# Patient Record
Sex: Female | Born: 1960 | Race: Black or African American | Hispanic: No | Marital: Single | Smoking: Never smoker
Health system: Southern US, Community
[De-identification: ages and names within clinical notes are randomized; demographics above are authoritative.]

## PROBLEM LIST (undated history)

## (undated) DIAGNOSIS — N939 Abnormal uterine and vaginal bleeding, unspecified: Secondary | ICD-10-CM

## (undated) DIAGNOSIS — I639 Cerebral infarction, unspecified: Secondary | ICD-10-CM

## (undated) DIAGNOSIS — J189 Pneumonia, unspecified organism: Secondary | ICD-10-CM

## (undated) DIAGNOSIS — K5900 Constipation, unspecified: Secondary | ICD-10-CM

## (undated) DIAGNOSIS — W1841XD Slipping, tripping and stumbling without falling due to stepping on object, subsequent encounter: Secondary | ICD-10-CM

## (undated) DIAGNOSIS — E1151 Type 2 diabetes mellitus with diabetic peripheral angiopathy without gangrene: Secondary | ICD-10-CM

## (undated) DIAGNOSIS — K219 Gastro-esophageal reflux disease without esophagitis: Secondary | ICD-10-CM

## (undated) DIAGNOSIS — L089 Local infection of the skin and subcutaneous tissue, unspecified: Secondary | ICD-10-CM

## (undated) DIAGNOSIS — T148XXA Other injury of unspecified body region, initial encounter: Secondary | ICD-10-CM

## (undated) DIAGNOSIS — T4145XA Adverse effect of unspecified anesthetic, initial encounter: Secondary | ICD-10-CM

## (undated) DIAGNOSIS — T8859XA Other complications of anesthesia, initial encounter: Secondary | ICD-10-CM

## (undated) DIAGNOSIS — I4891 Unspecified atrial fibrillation: Secondary | ICD-10-CM

## (undated) DIAGNOSIS — I739 Peripheral vascular disease, unspecified: Secondary | ICD-10-CM

## (undated) DIAGNOSIS — N186 End stage renal disease: Secondary | ICD-10-CM

## (undated) DIAGNOSIS — D649 Anemia, unspecified: Secondary | ICD-10-CM

## (undated) DIAGNOSIS — I10 Essential (primary) hypertension: Secondary | ICD-10-CM

## (undated) HISTORY — PX: KNEE SURGERY: SHX244

## (undated) HISTORY — DX: End stage renal disease: N18.6

## (undated) HISTORY — DX: Cerebral infarction, unspecified: I63.9

## (undated) HISTORY — PX: FOOT SURGERY: SHX648

## (undated) HISTORY — DX: Essential (primary) hypertension: I10

## (undated) HISTORY — DX: Abnormal uterine and vaginal bleeding, unspecified: N93.9

## (undated) HISTORY — PX: AV FISTULA PLACEMENT: SHX1204

## (undated) HISTORY — DX: Slipping, tripping and stumbling without falling due to stepping on object, subsequent encounter: W18.41XD

## (undated) HISTORY — PX: LEG SURGERY: SHX1003

## (undated) HISTORY — PX: BRACHIAL ARTERY GRAFT: SHX1258

## (undated) HISTORY — PX: OTHER SURGICAL HISTORY: SHX169

## (undated) HISTORY — DX: Type 2 diabetes mellitus with diabetic peripheral angiopathy without gangrene: E11.51

---

## 1984-04-23 HISTORY — PX: DILATION AND CURETTAGE OF UTERUS: SHX78

## 1997-08-01 ENCOUNTER — Inpatient Hospital Stay (HOSPITAL_COMMUNITY): Admission: EM | Admit: 1997-08-01 | Discharge: 1997-08-04 | Payer: Self-pay | Admitting: *Deleted

## 1998-03-15 ENCOUNTER — Inpatient Hospital Stay (HOSPITAL_COMMUNITY): Admission: EM | Admit: 1998-03-15 | Discharge: 1998-03-17 | Payer: Self-pay | Admitting: Emergency Medicine

## 1998-03-15 ENCOUNTER — Encounter: Payer: Self-pay | Admitting: Internal Medicine

## 1998-06-07 ENCOUNTER — Emergency Department (HOSPITAL_COMMUNITY): Admission: EM | Admit: 1998-06-07 | Discharge: 1998-06-08 | Payer: Self-pay | Admitting: Emergency Medicine

## 1998-08-17 ENCOUNTER — Emergency Department (HOSPITAL_COMMUNITY): Admission: EM | Admit: 1998-08-17 | Discharge: 1998-08-17 | Payer: Self-pay | Admitting: Emergency Medicine

## 1998-08-17 ENCOUNTER — Encounter: Payer: Self-pay | Admitting: Emergency Medicine

## 1999-01-18 ENCOUNTER — Emergency Department (HOSPITAL_COMMUNITY): Admission: EM | Admit: 1999-01-18 | Discharge: 1999-01-18 | Payer: Self-pay | Admitting: Emergency Medicine

## 1999-10-03 ENCOUNTER — Ambulatory Visit (HOSPITAL_COMMUNITY): Admission: RE | Admit: 1999-10-03 | Discharge: 1999-10-03 | Payer: Self-pay | Admitting: *Deleted

## 2000-03-18 ENCOUNTER — Encounter: Payer: Self-pay | Admitting: Emergency Medicine

## 2000-03-18 ENCOUNTER — Emergency Department (HOSPITAL_COMMUNITY): Admission: EM | Admit: 2000-03-18 | Discharge: 2000-03-18 | Payer: Self-pay

## 2000-05-19 ENCOUNTER — Inpatient Hospital Stay (HOSPITAL_COMMUNITY): Admission: EM | Admit: 2000-05-19 | Discharge: 2000-05-24 | Payer: Self-pay

## 2000-05-19 ENCOUNTER — Encounter: Payer: Self-pay | Admitting: *Deleted

## 2000-05-21 ENCOUNTER — Encounter: Payer: Self-pay | Admitting: Pediatrics

## 2000-05-21 ENCOUNTER — Encounter: Payer: Self-pay | Admitting: Nephrology

## 2000-06-01 ENCOUNTER — Emergency Department (HOSPITAL_COMMUNITY): Admission: EM | Admit: 2000-06-01 | Discharge: 2000-06-01 | Payer: Self-pay | Admitting: Emergency Medicine

## 2000-06-01 ENCOUNTER — Encounter: Payer: Self-pay | Admitting: Emergency Medicine

## 2000-07-04 ENCOUNTER — Ambulatory Visit (HOSPITAL_COMMUNITY): Admission: RE | Admit: 2000-07-04 | Discharge: 2000-07-04 | Payer: Self-pay | Admitting: Nephrology

## 2000-07-04 ENCOUNTER — Encounter: Payer: Self-pay | Admitting: Nephrology

## 2000-08-05 ENCOUNTER — Emergency Department (HOSPITAL_COMMUNITY): Admission: EM | Admit: 2000-08-05 | Discharge: 2000-08-05 | Payer: Self-pay | Admitting: Emergency Medicine

## 2000-11-05 ENCOUNTER — Ambulatory Visit (HOSPITAL_COMMUNITY): Admission: RE | Admit: 2000-11-05 | Discharge: 2000-11-05 | Payer: Self-pay | Admitting: Vascular Surgery

## 2000-11-05 ENCOUNTER — Encounter: Payer: Self-pay | Admitting: Vascular Surgery

## 2000-11-21 ENCOUNTER — Encounter: Admission: RE | Admit: 2000-11-21 | Discharge: 2000-11-21 | Payer: Self-pay | Admitting: Nephrology

## 2000-11-21 ENCOUNTER — Encounter: Payer: Self-pay | Admitting: Nephrology

## 2000-12-03 ENCOUNTER — Other Ambulatory Visit: Admission: RE | Admit: 2000-12-03 | Discharge: 2000-12-03 | Payer: Self-pay | Admitting: *Deleted

## 2001-02-27 ENCOUNTER — Encounter: Admission: RE | Admit: 2001-02-27 | Discharge: 2001-02-27 | Payer: Self-pay | Admitting: Nephrology

## 2001-02-27 ENCOUNTER — Encounter: Payer: Self-pay | Admitting: Nephrology

## 2001-06-22 ENCOUNTER — Encounter: Payer: Self-pay | Admitting: Emergency Medicine

## 2001-06-22 ENCOUNTER — Emergency Department (HOSPITAL_COMMUNITY): Admission: EM | Admit: 2001-06-22 | Discharge: 2001-06-22 | Payer: Self-pay | Admitting: Emergency Medicine

## 2001-11-25 ENCOUNTER — Encounter: Admission: RE | Admit: 2001-11-25 | Discharge: 2001-11-25 | Payer: Self-pay | Admitting: Nephrology

## 2001-11-25 ENCOUNTER — Encounter: Payer: Self-pay | Admitting: Nephrology

## 2001-12-02 ENCOUNTER — Ambulatory Visit (HOSPITAL_COMMUNITY): Admission: RE | Admit: 2001-12-02 | Discharge: 2001-12-02 | Payer: Self-pay | Admitting: Nephrology

## 2001-12-02 ENCOUNTER — Encounter: Payer: Self-pay | Admitting: Nephrology

## 2001-12-11 ENCOUNTER — Other Ambulatory Visit: Admission: RE | Admit: 2001-12-11 | Discharge: 2001-12-11 | Payer: Self-pay | Admitting: *Deleted

## 2001-12-11 ENCOUNTER — Other Ambulatory Visit: Admission: RE | Admit: 2001-12-11 | Discharge: 2001-12-11 | Payer: Self-pay | Admitting: Obstetrics and Gynecology

## 2002-07-20 ENCOUNTER — Encounter: Payer: Self-pay | Admitting: Vascular Surgery

## 2002-07-20 ENCOUNTER — Ambulatory Visit (HOSPITAL_COMMUNITY): Admission: RE | Admit: 2002-07-20 | Discharge: 2002-07-20 | Payer: Self-pay | Admitting: Vascular Surgery

## 2003-01-14 ENCOUNTER — Encounter: Admission: RE | Admit: 2003-01-14 | Discharge: 2003-01-14 | Payer: Self-pay | Admitting: Nephrology

## 2003-01-14 ENCOUNTER — Encounter: Payer: Self-pay | Admitting: Nephrology

## 2003-01-26 ENCOUNTER — Other Ambulatory Visit: Admission: RE | Admit: 2003-01-26 | Discharge: 2003-01-26 | Payer: Self-pay | Admitting: *Deleted

## 2003-04-07 ENCOUNTER — Ambulatory Visit (HOSPITAL_COMMUNITY): Admission: RE | Admit: 2003-04-07 | Discharge: 2003-04-07 | Payer: Self-pay | Admitting: Vascular Surgery

## 2003-04-30 ENCOUNTER — Ambulatory Visit (HOSPITAL_COMMUNITY): Admission: RE | Admit: 2003-04-30 | Discharge: 2003-04-30 | Payer: Self-pay | Admitting: Vascular Surgery

## 2003-05-31 ENCOUNTER — Ambulatory Visit (HOSPITAL_COMMUNITY): Admission: RE | Admit: 2003-05-31 | Discharge: 2003-05-31 | Payer: Self-pay | Admitting: Vascular Surgery

## 2004-04-19 ENCOUNTER — Encounter: Admission: RE | Admit: 2004-04-19 | Discharge: 2004-04-19 | Payer: Self-pay | Admitting: Nephrology

## 2004-09-08 ENCOUNTER — Ambulatory Visit (HOSPITAL_COMMUNITY): Admission: RE | Admit: 2004-09-08 | Discharge: 2004-09-08 | Payer: Self-pay | Admitting: Vascular Surgery

## 2005-01-26 ENCOUNTER — Ambulatory Visit (HOSPITAL_COMMUNITY): Admission: RE | Admit: 2005-01-26 | Discharge: 2005-01-26 | Payer: Self-pay | Admitting: Vascular Surgery

## 2005-01-27 ENCOUNTER — Ambulatory Visit (HOSPITAL_COMMUNITY): Admission: RE | Admit: 2005-01-27 | Discharge: 2005-01-27 | Payer: Self-pay | Admitting: Vascular Surgery

## 2005-05-14 ENCOUNTER — Encounter: Admission: RE | Admit: 2005-05-14 | Discharge: 2005-05-14 | Payer: Self-pay | Admitting: Nephrology

## 2005-05-30 ENCOUNTER — Emergency Department (HOSPITAL_COMMUNITY): Admission: EM | Admit: 2005-05-30 | Discharge: 2005-05-30 | Payer: Self-pay | Admitting: Emergency Medicine

## 2005-07-18 ENCOUNTER — Ambulatory Visit (HOSPITAL_COMMUNITY): Admission: RE | Admit: 2005-07-18 | Discharge: 2005-07-18 | Payer: Self-pay | Admitting: Nephrology

## 2005-07-27 ENCOUNTER — Emergency Department (HOSPITAL_COMMUNITY): Admission: EM | Admit: 2005-07-27 | Discharge: 2005-07-27 | Payer: Self-pay | Admitting: Emergency Medicine

## 2005-07-27 ENCOUNTER — Ambulatory Visit (HOSPITAL_COMMUNITY): Admission: RE | Admit: 2005-07-27 | Discharge: 2005-07-27 | Payer: Self-pay | Admitting: Nephrology

## 2005-07-28 ENCOUNTER — Inpatient Hospital Stay (HOSPITAL_COMMUNITY): Admission: EM | Admit: 2005-07-28 | Discharge: 2005-08-04 | Payer: Self-pay | Admitting: Nephrology

## 2005-07-30 ENCOUNTER — Encounter (INDEPENDENT_AMBULATORY_CARE_PROVIDER_SITE_OTHER): Payer: Self-pay | Admitting: *Deleted

## 2006-05-16 ENCOUNTER — Encounter: Admission: RE | Admit: 2006-05-16 | Discharge: 2006-05-16 | Payer: Self-pay | Admitting: Nephrology

## 2006-05-29 ENCOUNTER — Encounter: Admission: RE | Admit: 2006-05-29 | Discharge: 2006-05-29 | Payer: Self-pay | Admitting: Nephrology

## 2006-06-11 ENCOUNTER — Emergency Department (HOSPITAL_COMMUNITY): Admission: EM | Admit: 2006-06-11 | Discharge: 2006-06-11 | Payer: Self-pay | Admitting: Emergency Medicine

## 2006-06-25 ENCOUNTER — Emergency Department (HOSPITAL_COMMUNITY): Admission: EM | Admit: 2006-06-25 | Discharge: 2006-06-25 | Payer: Self-pay | Admitting: Family Medicine

## 2006-08-13 ENCOUNTER — Emergency Department (HOSPITAL_COMMUNITY): Admission: EM | Admit: 2006-08-13 | Discharge: 2006-08-13 | Payer: Self-pay | Admitting: Emergency Medicine

## 2006-12-04 ENCOUNTER — Emergency Department (HOSPITAL_COMMUNITY): Admission: EM | Admit: 2006-12-04 | Discharge: 2006-12-04 | Payer: Self-pay | Admitting: Emergency Medicine

## 2007-01-23 ENCOUNTER — Inpatient Hospital Stay (HOSPITAL_COMMUNITY): Admission: RE | Admit: 2007-01-23 | Discharge: 2007-01-27 | Payer: Self-pay | Admitting: Orthopedic Surgery

## 2007-01-24 ENCOUNTER — Ambulatory Visit: Payer: Self-pay | Admitting: Internal Medicine

## 2007-02-06 ENCOUNTER — Ambulatory Visit (HOSPITAL_COMMUNITY): Admission: RE | Admit: 2007-02-06 | Discharge: 2007-02-06 | Payer: Self-pay | Admitting: Nephrology

## 2007-02-11 ENCOUNTER — Encounter: Admission: RE | Admit: 2007-02-11 | Discharge: 2007-03-13 | Payer: Self-pay | Admitting: Orthopedic Surgery

## 2007-05-27 ENCOUNTER — Encounter: Admission: RE | Admit: 2007-05-27 | Discharge: 2007-05-27 | Payer: Self-pay | Admitting: Nephrology

## 2007-05-27 ENCOUNTER — Emergency Department (HOSPITAL_COMMUNITY): Admission: EM | Admit: 2007-05-27 | Discharge: 2007-05-27 | Payer: Self-pay | Admitting: Emergency Medicine

## 2007-11-28 ENCOUNTER — Ambulatory Visit: Payer: Self-pay | Admitting: *Deleted

## 2007-11-28 ENCOUNTER — Ambulatory Visit (HOSPITAL_COMMUNITY): Admission: RE | Admit: 2007-11-28 | Discharge: 2007-11-28 | Payer: Self-pay | Admitting: Vascular Surgery

## 2007-12-01 ENCOUNTER — Ambulatory Visit (HOSPITAL_COMMUNITY): Admission: RE | Admit: 2007-12-01 | Discharge: 2007-12-01 | Payer: Self-pay | Admitting: Nephrology

## 2008-01-08 ENCOUNTER — Emergency Department (HOSPITAL_COMMUNITY): Admission: EM | Admit: 2008-01-08 | Discharge: 2008-01-08 | Payer: Self-pay | Admitting: Emergency Medicine

## 2008-02-12 ENCOUNTER — Ambulatory Visit (HOSPITAL_COMMUNITY): Admission: RE | Admit: 2008-02-12 | Discharge: 2008-02-12 | Payer: Self-pay | Admitting: Nephrology

## 2008-06-12 ENCOUNTER — Ambulatory Visit (HOSPITAL_COMMUNITY): Admission: RE | Admit: 2008-06-12 | Discharge: 2008-06-12 | Payer: Self-pay | Admitting: Nephrology

## 2008-06-15 ENCOUNTER — Ambulatory Visit (HOSPITAL_COMMUNITY): Admission: RE | Admit: 2008-06-15 | Discharge: 2008-06-15 | Payer: Self-pay | Admitting: Family Medicine

## 2008-06-21 ENCOUNTER — Emergency Department (HOSPITAL_COMMUNITY): Admission: EM | Admit: 2008-06-21 | Discharge: 2008-06-21 | Payer: Self-pay | Admitting: Emergency Medicine

## 2008-06-29 ENCOUNTER — Ambulatory Visit (HOSPITAL_COMMUNITY): Admission: RE | Admit: 2008-06-29 | Discharge: 2008-06-29 | Payer: Self-pay | Admitting: Nephrology

## 2008-07-13 ENCOUNTER — Ambulatory Visit: Payer: Self-pay | Admitting: Vascular Surgery

## 2008-07-13 ENCOUNTER — Encounter: Admission: RE | Admit: 2008-07-13 | Discharge: 2008-07-13 | Payer: Self-pay | Admitting: Nephrology

## 2008-08-19 ENCOUNTER — Inpatient Hospital Stay (HOSPITAL_COMMUNITY): Admission: RE | Admit: 2008-08-19 | Discharge: 2008-08-21 | Payer: Self-pay | Admitting: Vascular Surgery

## 2008-08-19 ENCOUNTER — Ambulatory Visit: Payer: Self-pay | Admitting: Vascular Surgery

## 2008-09-07 ENCOUNTER — Ambulatory Visit: Payer: Self-pay | Admitting: Vascular Surgery

## 2008-10-07 ENCOUNTER — Emergency Department (HOSPITAL_COMMUNITY): Admission: EM | Admit: 2008-10-07 | Discharge: 2008-10-07 | Payer: Self-pay | Admitting: Emergency Medicine

## 2008-10-14 ENCOUNTER — Ambulatory Visit (HOSPITAL_COMMUNITY): Admission: RE | Admit: 2008-10-14 | Discharge: 2008-10-14 | Payer: Self-pay | Admitting: Nephrology

## 2008-11-04 ENCOUNTER — Ambulatory Visit (HOSPITAL_COMMUNITY): Admission: RE | Admit: 2008-11-04 | Discharge: 2008-11-04 | Payer: Self-pay | Admitting: Nephrology

## 2009-04-01 ENCOUNTER — Emergency Department (HOSPITAL_COMMUNITY): Admission: EM | Admit: 2009-04-01 | Discharge: 2009-04-01 | Payer: Self-pay | Admitting: Family Medicine

## 2009-05-07 ENCOUNTER — Emergency Department (HOSPITAL_COMMUNITY): Admission: EM | Admit: 2009-05-07 | Discharge: 2009-05-07 | Payer: Self-pay | Admitting: Emergency Medicine

## 2009-07-28 ENCOUNTER — Encounter: Admission: RE | Admit: 2009-07-28 | Discharge: 2009-07-28 | Payer: Self-pay | Admitting: Nephrology

## 2009-09-01 ENCOUNTER — Emergency Department (HOSPITAL_COMMUNITY): Admission: EM | Admit: 2009-09-01 | Discharge: 2009-09-01 | Payer: Self-pay | Admitting: Family Medicine

## 2010-05-14 ENCOUNTER — Encounter: Payer: Self-pay | Admitting: *Deleted

## 2010-05-14 ENCOUNTER — Encounter: Payer: Self-pay | Admitting: Nephrology

## 2010-07-08 LAB — CBC
Hemoglobin: 12.5 g/dL (ref 12.0–15.0)
MCHC: 33.3 g/dL (ref 30.0–36.0)
RBC: 4.28 MIL/uL (ref 3.87–5.11)
RDW: 18 % — ABNORMAL HIGH (ref 11.5–15.5)

## 2010-07-08 LAB — COMPREHENSIVE METABOLIC PANEL
BUN: 31 mg/dL — ABNORMAL HIGH (ref 6–23)
Calcium: 10.5 mg/dL (ref 8.4–10.5)
Chloride: 94 mEq/L — ABNORMAL LOW (ref 96–112)
Creatinine, Ser: 10.3 mg/dL — ABNORMAL HIGH (ref 0.4–1.2)
GFR calc non Af Amer: 4 mL/min — ABNORMAL LOW (ref >60)

## 2010-07-08 LAB — DIFFERENTIAL
Basophils Absolute: 0 10*3/uL (ref 0.0–0.1)
Lymphocytes Relative: 25 % (ref 12–46)
Monocytes Absolute: 0.4 10*3/uL (ref 0.1–1.0)
Neutro Abs: 5.2 10*3/uL (ref 1.7–7.7)

## 2010-07-31 LAB — WET PREP, GENITAL

## 2010-07-31 LAB — GC/CHLAMYDIA PROBE AMP, GENITAL: Chlamydia, DNA Probe: NEGATIVE

## 2010-08-01 LAB — GLUCOSE, CAPILLARY
Glucose-Capillary: 127 mg/dL — ABNORMAL HIGH (ref 70–99)
Glucose-Capillary: 232 mg/dL — ABNORMAL HIGH (ref 70–99)

## 2010-08-02 LAB — CBC
Platelets: 236 10*3/uL (ref 150–400)
RDW: 22.5 % — ABNORMAL HIGH (ref 11.5–15.5)

## 2010-08-02 LAB — RENAL FUNCTION PANEL
Albumin: 3.1 g/dL — ABNORMAL LOW (ref 3.5–5.2)
Phosphorus: 6.5 mg/dL — ABNORMAL HIGH (ref 2.3–4.6)
Potassium: 4.8 mEq/L (ref 3.5–5.1)
Sodium: 136 mEq/L (ref 135–145)

## 2010-08-02 LAB — GLUCOSE, CAPILLARY
Glucose-Capillary: 136 mg/dL — ABNORMAL HIGH (ref 70–99)
Glucose-Capillary: 142 mg/dL — ABNORMAL HIGH (ref 70–99)
Glucose-Capillary: 149 mg/dL — ABNORMAL HIGH (ref 70–99)
Glucose-Capillary: 154 mg/dL — ABNORMAL HIGH (ref 70–99)
Glucose-Capillary: 296 mg/dL — ABNORMAL HIGH (ref 70–99)
Glucose-Capillary: 87 mg/dL (ref 70–99)

## 2010-08-02 LAB — POCT I-STAT 4, (NA,K, GLUC, HGB,HCT)
HCT: 44 % (ref 36.0–46.0)
Hemoglobin: 15 g/dL (ref 12.0–15.0)

## 2010-08-08 LAB — CBC
HCT: 38.7 % (ref 36.0–46.0)
Hemoglobin: 12.8 g/dL (ref 12.0–15.0)
RBC: 4.63 MIL/uL (ref 3.87–5.11)
RDW: 20.9 % — ABNORMAL HIGH (ref 11.5–15.5)

## 2010-08-08 LAB — APTT: aPTT: 34 seconds (ref 24–37)

## 2010-08-08 LAB — BASIC METABOLIC PANEL
CO2: 21 mEq/L (ref 19–32)
GFR calc Af Amer: 3 mL/min — ABNORMAL LOW (ref >60)
GFR calc non Af Amer: 2 mL/min — ABNORMAL LOW (ref >60)
Glucose, Bld: 78 mg/dL (ref 70–99)
Potassium: 4.3 mEq/L (ref 3.5–5.1)
Sodium: 134 mEq/L — ABNORMAL LOW (ref 135–145)

## 2010-08-23 ENCOUNTER — Other Ambulatory Visit: Payer: Self-pay | Admitting: Nephrology

## 2010-08-23 DIAGNOSIS — Z1231 Encounter for screening mammogram for malignant neoplasm of breast: Secondary | ICD-10-CM

## 2010-09-05 ENCOUNTER — Ambulatory Visit
Admission: RE | Admit: 2010-09-05 | Discharge: 2010-09-05 | Disposition: A | Discharge status: Home / Self Care | Payer: Medicaid Other | Source: Ambulatory Visit | Attending: Nephrology | Admitting: Nephrology

## 2010-09-05 DIAGNOSIS — Z1231 Encounter for screening mammogram for malignant neoplasm of breast: Secondary | ICD-10-CM

## 2010-09-05 NOTE — Procedures (Signed)
CEPHALIC VEIN MAPPING   INDICATION:  Cephalic vein mapping for dialysis access site.   HISTORY:  End-stage renal disease.   EXAM:   The right cephalic vein is compressible.   Diameter measurements range from 0.31 to 0.14 cm.   The left cephalic vein is not evaluated.   See attached worksheet for all measurements.   IMPRESSION:  Patent right cephalic vein which is of acceptable diameter  for use as a dialysis access site.   ___________________________________________  Quita Skye. Hart Rochester, M.D.   AC/MEDQ  D:  07/13/2008  T:  07/13/2008  Job:  [id number]

## 2010-09-05 NOTE — Op Note (Signed)
[name], [name]              ACCOUNT NO.:  [id number]   MEDICAL RECORD NO.:  [id number]          PATIENT TYPE:  OIB   LOCATION:  6729                         FACILITY:  [facility]   PHYSICIAN:  Quita Skye. Hart Rochester, M.D.  DATE OF BIRTH:  08/02/1960   DATE OF PROCEDURE:  08/19/2008  DATE OF DISCHARGE:                               OPERATIVE REPORT   PREOPERATIVE DIAGNOSIS:  End-stage renal disease.   POSTOPERATIVE DIAGNOSIS:  End-stage renal disease.   OPERATION:  Insertion of a right thigh arteriovenous Gore-Tex graft (6  mm) from common femoral artery to saphenofemoral junction (venous).   SURGEON:  Quita Skye. Hart Rochester, MD   FIRST ASSISTANT:  Jerold Coombe, PA   ANESTHESIA:  General endotracheal.   PROCEDURE:  The patient was taken to the operating room and placed in a  supine position at which time satisfactory general endotracheal  anesthesia was administered.  The right groin and thigh were prepped  with Betadine scrub solution and draped in routine sterile manner.  A  short longitudinal incision was made just distal to the inguinal crease.  Superficial femoral artery was dissected free at its origin.  It was a  very diffusely calcified vessel, although it was patent.  Therefore, the  common and profunda femoris arteries were dissected free.  The common  femoral much softer anteriorly with a good pulse.  Saphenous vein was  exposed up to the saphenofemoral junction, its branches ligated with 3-0  silk ties and divided.  A loop-shaped tunnel was then created and a 6 mm  x 40 cm Gore-Tex graft delivered through the tunnel using a small  counterincision at the apex loop.  No heparin was given.  Artery was  occluded proximally and distally, opened with a 15 blade, and extended  with Potts scissors in the distal common femoral artery.  There was  excellent inflow present.  Gore-Tex was slightly spatulated and  anastomosed end-to-side with 6-0 Prolene.  Following this, the vein  was  ligated distally.  The saphenous vein was ligated distally and opened 15  blade and extended with Potts scissors to a point about 1-2 cm proximal  to the saphenofemoral junction.  It was a widely patent vein which  easily accepted a 5 dilator.  Gore-Tex was anastomosed end-to-side with  6-0 Prolene.  Clamps were released.  There was a good pulse and thrill  in the graft.  Adequate hemostasis was achieved.  Wounds were closed in  layers with Vicryl in subcuticular fashion.  A sterile dressing applied.  The patient was taken to recovery room in satisfactory condition.      Quita Skye Hart Rochester, M.D.  Electronically Signed     JDL/MEDQ  D:  08/19/2008  T:  08/20/2008  Job:  [id number]

## 2010-09-05 NOTE — Discharge Summary (Signed)
NAMEEFRATA, BRUNNER NO.:  [id number]   MEDICAL RECORD NO.:  [id number]          PATIENT TYPE:  INP   LOCATION:  5524                         FACILITY:  [facility]   PHYSICIAN:  Dyke Brackett, M.D.    DATE OF BIRTH:  May 05, 1960   DATE OF ADMISSION:  01/23/2007  DATE OF DISCHARGE:  01/27/2007                               DISCHARGE SUMMARY   ADMISSION DIAGNOSES:  1. Septic osteoarthritis right knee.  2. Chronic renal insufficiency on hemodialysis.  3. Diabetes mellitus.  4. Hypertension.   DISCHARGE DIAGNOSES:  1. Septic right knee status post irrigation and debridement.  2. Acute blood loss anemia secondary to surgery.  3. End-stage renal disease on hemodialysis.  4. Non-insulin dependent diabetes mellitus.  5. Hypertension.   SURGICAL PROCEDURES:  On January 23, 2007, Ms. Date underwent an  arthroscopic irrigation and debridement and partial synovectomy of her  right knee by Dr. Jonny Ruiz L. Rendall.   COMPLICATIONS:  None.   CONSULTANT:  1. Renal Medicine consult January 23, 2007.  2. Pharmacy consult for vancomycin for test treatment January 24, 2007      in addition to a physical therapy consult.  3. Infectious disease consult January 24, 2007.   HISTORY OF PRESENT ILLNESS:  This 50 year old black female patient  presented to our office with a painful right knee for more than 1 month.  She had an MRI which showed extensive edema consistent with severe  synovitis.  She had been in the office a week before for evaluation, but  walked out before aspiration could be done or complete evaluation done,  and returned today with malaise, fevers or loss of appetite.  Aspiration  of the knee at that time showed probable purulent material.  She is  being admitted for arthroscopic I&D of the right knee.   HOSPITAL COURSE:  Ms. Koffler was admitted and a renal consult was  obtained.  They followed her throughout her hospitalization.  She  subsequently underwent an  arthroscopic I&D and partial synovectomy of  the right knee by Dr. Priscille Kluver that evening and tolerated it well.   On postop day number 1, she was being dialyzed.  Knee was much more  comfortable.  T-max was 99.5, vitals were stable.  Leg was  neurovascularly intact.  Infectious disease was consulted and she was  started on therapy.   Postop day number 2, T-max had been 102.1, vitals were stable.  She  continued on vancomycin and Fortaz.  She did have gram positive cocci  grow out of 2 out of 4 blood cultures and I&D was awaited.  Pain had  improved.  Leg was neurovascularly intact.  She was continued on CPM and  PT, and continued on antibiotics.   Postop day number 3, she remained afebrile, vitals stable.  There was no  drainage from the knee at all.  Hemoglobin was 7.2, hematocrit 21.8.  She was to get 2 units of packed red blood cells with dialysis the next  day.  Cultures grew out coag-negative staph and she was continued on the  current antibiotics till her final  cultures were available.   On January 27, 2007, she was doing well and felt she was ready for  discharge home.  She was transfused during dialysis.  ID recommended  discharge on Avelox 400 mg a day for 3 more weeks.  She was continued on  her normal treatment course for her hemodialysis and she was to follow  up with [place] in the next week or so.   DIET:  She is to resume her regular renal diet.   MEDICATIONS:  She is to resume her regular prehospitalization meds.  These include:  1. Aspirin 81 mg p.o. q.a.m.  2. Norvasc 10 mg p.o. q.p.m.  3. Glipizide 10 mg p.o. b.i.d.  4. Calcium 500 mg p.o. q.a.c.  5. Renovite 1 tablet p.o. q.a.m.  6. Actos 45 mg p.o. q.a.m.   ADDITIONAL MEDICATIONS:  1. Avelox 400 mg 1 tablet p.o. q.a.m. for 3 more weeks.  2. Ultram 50 mg 1 tablet p.o. q.4-6 h. p.r.n. for pain, 50 with no      refill.  3. Renagel 800 mg 2 tablets p.o. q. with each meal.  4. We did want her to hold her calcium at  this time.  5. Norvasc 10 mg p.o. q.h.s.  6. Clonidine 0.1 mg p.o. q.h.s.   ACTIVITY:  She is to be out of bed weightbearing as tolerated on the  right leg with a walker.  She may shower and bathe.  No lifting for 4  weeks.  No driving for 4 weeks.  She is to increase activity slowly.   WOUND CARE:  She is to keep the incisions clean, dry and cover with a  Band-Aid.  Change that daily. Notify Dr. Priscille Kluver if signs of any  infection.   FOLLOW UP:  She needs to follow up with Dr. Priscille Kluver in our office on  February 04, 2007 and she needs to call [phone] for that appointment.   DIAGNOSTICS:  Chest x-ray done on January 23, 2007 showed no active  disease.   LABORATORY DATA:  Hemoglobin/hematocrit ranged from 6.2 and 18.9 on  January 23, 2007 to 6.7, 20.9 on January 24, 2007 to 7.2 and 21.8 on  January 26, 2007.  White count ranged from 7.9 on  January 23, 2007 to  10.5 on January 24, 2007 to 8.7 on January 26, 2007.  Platelets ranged  from 596 on January 23, 2007 to 504 on January 24, 2007 to 565 on January 26, 2007.  ESR on January 23, 2007 was greater than 140.  PT on January 23, 2007 was  16.4 with an INR of 1.3.  On January 23, 2007, chloride was 94,  creatinine is 8.5.  On January 24, 2007  BUN was 28, creatinine 10.09.  On January 26, 2007 36 and 9.83 and on January 27, 2007 51 and 13.21.  Sodium ranged from 135 on January 24, 2007 to 129 on January 27, 2007.  Chloride from 94 on January 24, 2007 to 90 on January 27, 2007.  Glucose  from 152 on January 24, 2007 and 92 on January 26, 2007, and 222 on  January 27, 2007.  Estimated GFR was only 5 mL per minute on January 23, 2007.  It went down to 3 mL per second on January 27, 2007.  On January 23, 2007 albumin was low at 2.7 and went to a  low of 2.3 on  January 27, 2007.  UA on January 23, 2007 showed cloudy  yellow urine  250 mg/dL,  glucose large, hemoglobin greater than 300  mg/dL protein, many epithelials, 0-2 white cells and red cells, rare   bacteria.  All other laboratory studies are within normal limits.      Legrand Pitts Duffy, Arnetha Courser, M.D.  Electronically Signed    KED/MEDQ  D:  03/12/2007  T:  03/12/2007  Job:  [id number]   cc:   Renal Service

## 2010-09-05 NOTE — Assessment & Plan Note (Signed)
OFFICE VISIT   [name], [name]  DOB:  December 04, 1960                                       07/13/2008  [id number]#:[id number]   The patient returns for evaluation for vascular access.  She was  previously had left upper extremity grafts placed by [place] but for the past  few years has had a Diatek catheter initially on the left side which  then became infected and most recently on the right side.  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  The Diatek catheter traverses this and probably almost obstructs the  venous return which caused the facial edema.  She was sent for  evaluation for vascular access today.  She does have vein mapping as  well which reveals a small cephalic vein throughout the right upper  extremity.  She has never had lower extremity grafts.   PHYSICAL EXAMINATION:  On exam she does have facial edema which she  states is improving but has not resolved.  Right IJ Diatek catheter is  in place.  She has excellent femoral, popliteal and dorsalis pedis  pulses bilaterally.   This patient needs a thigh graft for her next vascular access.  She is  not a good candidate for a HeRO graft because it will do the same thing  that the Diatek is doing and result in facial and neck swelling since it  will have to traverse this area of severe stenosis.  I think this would  be better reserved for a final access site after using her lower  extremities.  I have scheduled her for insertion of a right thigh AV  graft on Thursday, April 8.  She seems somewhat reluctant and will be  discussing this with Dr. Eliott Nine but was willing to go ahead and schedule  this for now.  I think access should be placed so that the right IJ  Diatek can be removed to eliminate the edema in head and neck  areas.   Quita Skye Hart Rochester, M.D.  Electronically Signed   JDL/MEDQ  D:  07/13/2008  T:  07/14/2008  Job:  [id number]

## 2010-09-05 NOTE — Assessment & Plan Note (Signed)
OFFICE VISIT   [name], [name]  DOB:  05-18-1960                                       09/07/2008  [id number]#:[id number]   The patient returns for followup regarding insertion of a right thigh AV  graft by me on 08/19/2008.  She currently has a Diatek catheter in the  right internal jugular vein and has fascial edema which is resolving  because of a severe stenosis in that area.  The AV graft has an  excellent pulse and palpable thrill and the incisions have healed  satisfactorily.  There is some mild swelling in the inguinal area but  this should resolve.  There is no evidence of infection.  There is no  evidence of steal distally in the right leg.  She was reassured  regarding these findings.  I think the graft could be used in two more  weeks and then the Diatek catheter can be removed shortly thereafter.   Quita Skye Hart Rochester, M.D.  Electronically Signed   JDL/MEDQ  D:  09/07/2008  T:  09/08/2008  Job:  [id number]   cc:   Wilber Bihari. Caryn Section, M.D.

## 2010-09-05 NOTE — Op Note (Signed)
[name], [name]              ACCOUNT NO.:  [id number]   MEDICAL RECORD NO.:  [id number]          PATIENT TYPE:  OIB   LOCATION:  5524                         FACILITY:  [facility]   PHYSICIAN:  John L. Rendall, M.D.  DATE OF BIRTH:  Oct 05, 1960   DATE OF PROCEDURE:  01/23/2007  DATE OF DISCHARGE:                               OPERATIVE REPORT   PREOPERATIVE DIAGNOSIS:  Chronic pyarthrosis, right knee.   SURGICAL PROCEDURES:  Arthroscopic irrigation and debridement with  partial synovectomy, right knee.   ANESTHESIA:  General.   FINDINGS:  Lots of fibrin clot and old bloody serous fluid.  Joint  surface was intact.  No erosions.   PROCEDURE:  Under general anesthesia, the right leg was prepared with  DuraPrep and draped as a sterile field, with a proximal thigh tourniquet  and a leg holder.  The leg was elevated and the tourniquet elevated to  350 mm.  After routine prep and drape, standard portals were made.  Findings of thick fibrin clot and old bloody serous fluid were found in  the knee.  I did not find gross purulence, but the tenderness and warmth  in the knee and inability to tolerate touching it strongly suggested  infection as did the MRI report.   After making the routine portals, difficulty was encountered getting the  arthroscopy pump system to work, but once it finally did work,  significant debris was debrided out of the suprapatellar pouch, and then  the pump system worked better.  The intra-articular shaver was used to  remove a good deal of this old clot and fibrin debris.  The undersurface  of the patella was intact.  The femoral trochlea was intact.  The medial  and lateral menisci were intact.  Prior to the surgical procedure, the  knee would bend only 0-90 degrees with significant pressure.  She has a  well-healed 8-inch lateral knee incision from old surgery, etiology  unknown.  Following debridement of the clot and chunks of fibrin in the  compartments  medial and lateral and around the cruciate ligaments, the  knee was then copiously irrigated with the remainder of 6 liters of  fluid.  A medium Hemovac drain was inserted, and a sterile compression  bandage was applied.   The patient returned to recovery in good condition.  Will start her on a  CPM machine and get her out of bed tomorrow.      John L. Rendall, M.D.  Electronically Signed     JLR/MEDQ  D:  01/23/2007  T:  01/24/2007  Job:  [id number]

## 2010-09-08 NOTE — Consult Note (Signed)
NAMEZANE, SAMSON NO.:  [id number]   MEDICAL RECORD NO.:  [id number]          PATIENT TYPE:  INP   LOCATION:  3305                         FACILITY:  [facility]   PHYSICIAN:  Petra Kuba, M.D.    DATE OF BIRTH:  July 22, 1960   DATE OF CONSULTATION:  07/29/2005  DATE OF DISCHARGE:                                   CONSULTATION   HISTORY OF PRESENT ILLNESS:  The patient is seen at the request of the renal  team for guaiac-positive anemia.  She has had two bouts of decreasing  hemoglobin according to the renal team thought secondary to dysfunctional  uterine bleeding, but she says her period has stopped and she has continued  to loose blood.  She has no previous history according to her of anemia and  no previous GI problems or GI workup.  She really does not have any GI  complaints.  She has been moving her bowels normally and has not noticed any  blood.   PAST MEDICAL HISTORY:  1.  End-stage renal disease secondary to diabetes and hypertension.  2.  Retinopathy.  3.  CVA in the past.  4.  History of normal uterine bleeding in the past.  5.  UTIs.  6.  Knee surgery.   FAMILY HISTORY:  Negative for an obvious GI problems.   ALLERGIES:  DOXYCYCLINE.   MEDICATIONS ON ADMISSION:  1.  Os-Cal.  2.  Baby aspirin.  3.  Robitussin.  4.  Actos.  5.  Norvasc.  6.  Glipizide.  7.  Plavix.  8.  NephroVite.   SOCIAL HISTORY:  She smoked in the past.  She rarely drinks.  She minimized  other over-the-counter medications, except for Tylenol.   REVIEW OF SYSTEMS:  Negative, except above.   PHYSICAL EXAMINATION:  GENERAL:  No acute distress, lying comfortable in the  bed.  VITAL SIGNS:  Stable, afebrile.  ABDOMEN:  Soft, nontender.  Good bowel sounds.   LABORATORY DATA:  Hemoglobin pertinent for baseline in middle of March of  about 10.7.  Has dropped to 5.1 and dropped again despite transfusions and  dialysis to 6.7.  BUN and creatinine were 94 and 8.9, dropped  with dialysis  to 61 and 6.5.  Platelet count normal, white blood count 12.4.   ASSESSMENT:  1.  Multiple medical problems.  2.  Anemia of questionable etiology.  3.  Guaiac positivity without obvious gastrointestinal bleeding or symptoms.   PLAN:  The risks, benefits, and methods of endoscopy were discussed, and we  will proceed tomorrow either before or after dialysis, with further workup  and plans pending those findings.  Possibly she will need a colonoscopy, we  briefly discussed that, and possibly even a capsule endoscopy if that was  nonrevealing.  We will follow with you.           ______________________________  Petra Kuba, M.D.     MEM/MEDQ  D:  07/29/2005  T:  07/30/2005  Job:  [id number]   cc:   Maree Krabbe, M.D.  Fax: [phone]

## 2010-09-08 NOTE — Op Note (Signed)
   NAMEMARVEL, [name] NO.:  [id number]   MEDICAL RECORD NO.:  [id number]                   PATIENT TYPE:  OIB   LOCATION:  2869                                 FACILITY:  [facility]   PHYSICIAN:  Larina Earthly, M.D.                 DATE OF BIRTH:  Jan 10, 1961   DATE OF PROCEDURE:  07/20/2002  DATE OF DISCHARGE:                                 OPERATIVE REPORT   PREOPERATIVE DIAGNOSES:  1. End-stage renal disease.  2. Occluded left arm arteriovenous Gore-Tex graft.   POSTOPERATIVE DIAGNOSES:  1. End-stage renal disease.  2. Occluded left arm arteriovenous Gore-Tex graft.   PROCEDURES:  1. Thrombectomy.  2. Jump graft revision to higher brachial vein of left forearm loop     arteriovenous Gore-Tex graft.   SURGEON:  Larina Earthly, M.D.   ASSISTANT:  Toribio Harbour, R.N.   ANESTHESIA:  MAC.   COMPLICATIONS:  None.   DISPOSITION:  Recovery room, stable.   PROCEDURE IN DETAIL:  The patient was taken to the operating room, placed in  supine position and the area of the left arm prepped and draped in usual  sterile fashion.  Incision made over the prior venous extension to the above  elbow, arm vein.  The graft was opened and the graft itself was  thrombectomized.  The arterialized plug was removed and excellent flow was  encountered.  The graft was flushed with heparinized solution then it re-  occluded.  Next, the venous anastomosis was thrombectomized.  There was a  tightness of the venous anastomosis.  The vein was exposed further  proximally and also the basilic vein was identified as well.  The basilic  vein was too small for outflow.  The brachial vein was traced further in the  upper arm and was a better caliber at this position.  A 5 dilator could pass  through the vein.  The vein was spatulated and was divided.  The new  interpositional graft of 6-mm Gore-Tex was brought onto the field, was  spatulated, and sewn end-to-end to the vein  with a running 6-0 Prolene  suture and then end-to-end with the old graft with running 6-0 Prolene  suture.  Clamps were removed and excellent flow was noted.  The wounds were  irrigated with saline, hemostasis with electrocautery.  The wounds were  closed with 3-0 Vicryl and the subcutaneous and subcuticular tissue Benzoin  and Steri-Strips were applied.                                               Larina Earthly, M.D.    TFE/MEDQ  D:  07/20/2002  T:  07/20/2002  Job:  [id number]

## 2010-09-08 NOTE — Op Note (Signed)
NAMEARWA, YERO NO.:  [id number]   MEDICAL RECORD NO.:  [id number]                   PATIENT TYPE:  OIB   LOCATION:  2871                                 FACILITY:  [facility]   PHYSICIAN:  Larina Earthly, M.D.                 DATE OF BIRTH:  May 28, 1960   DATE OF PROCEDURE:  04/30/2003  DATE OF DISCHARGE:  04/30/2003                                 OPERATIVE REPORT   PREOPERATIVE DIAGNOSIS:  End stage renal disease with occluded left forearm  loop AV Gore-Tex graft.   POSTOPERATIVE DIAGNOSIS:  End stage renal disease with occluded left forearm  loop AV Gore-Tex graft.   PROCEDURE:  Placement of left upper arm arteriovenous Gore-Tex graft and  placement of left internal jugular Diatech catheter.   SURGEON:  Larina Earthly, M.D.   ASSISTANT:  Coral Ceo, P.A.   ANESTHESIA:  Monitored anesthesia care.   COMPLICATIONS:  None.   DISPOSITION:  To the recovery room stable.   PROCEDURE IN DETAIL:  The patient was taken to the operating room and the  left arm and left axilla were prepped and draped in the usual sterile  fashion.  An incision was made over the antecubital space and carried down  to isolate the brachial artery which was of good caliber.  Next, a separate  incision was made using local anesthesia over the axilla and the axillary  vein was isolated, as well, and was also of good caliber.  A tunnel was  created over the lateral biceps area with a 6 Gore-Tex tunneler.  A 6 mm  standard Gore-Tex graft was brought through the tunnel.  The axillary vein  was occluded occluded proximally and distally, opened with an 11 blade,  extended with Potts scissors.  The graft was spatulated and sewn end-to-side  to the vein with running 6-0 Prolene suture.  The graft was flushed with  heparinized saline and reoccluded.  Next, the brachial artery was occluded  occluded proximally and distally, opened with an 11 blade, extended with  Potts scissors.  A  small arteriotomy was created.  The graft was cut to the  appropriate length and sewn end-to-side to the artery with running 6-0  Prolene suture.  The clamps were removed and an excellent thrill was noted.  The wound was irrigated with saline.  Hemostasis was obtained with cautery.  The wounds were closed with 3-0 Vicryl in the subcutaneous and subcuticular  tissue.  Steri-Strips were applied.   Next, the patient's left neck was visualized with ultrasound.  The patient  had a small to moderate size internal jugular vein on the left.  The patient  had already had a right IJ catheter placed by anesthesia for IV access for  surgery.  The left neck was reprepped and draped.  With the patient in  Trendelenburg position, using local anesthesia and a finder needle, the  internal  jugular vein was entered.  There was some difficulty threading the  guide-wire.  For this reason, end hole catheter was placed over the guide-  wire and Omnipaque was used for a venogram.  This showed that the guide-wire  was outside the vein with some extravasation.  The end hole catheter was  removed.  The internal jugular vein was again reaccessed and using Seldinger  technique, a guide-wire was passed down to the level of the right atrium.  This was confirmed with fluoroscopy.  A dilator and peel away sheath was  passed over the guide-wire and the dilator and guide-wire were removed.  A  32 cm Diatech catheter was passed down through the peel away sheath to the  level of the right atrium.  The peel away sheath was removed.  A separate  incision was made at the exit site and the tunnel was created from the entry  site to the catheter exit site and the catheter was brought through the  subcutaneous tunnel.  The two lumen port was attached to the catheter and  both lumens flushed and aspirated easily.  The graft was locked with 1000  units per mL of heparin.  The catheter was secured to the skin with 3-0  nylon stitch  and the entry site was closed with a 4-0 subcuticular Vicryl  stitch.  A sterile dressing was applied.  The patient was taken to the  recovery room where chest x-ray was obtained.                                               Larina Earthly, M.D.    TFE/MEDQ  D:  04/30/2003  T:  05/01/2003  Job:  [id number]

## 2010-09-08 NOTE — Op Note (Signed)
NAMEAVERIANNA, BRUGGER              ACCOUNT NO.:  [id number]   MEDICAL RECORD NO.:  [id number]          PATIENT TYPE:  AMB   LOCATION:  SDS                          FACILITY:  [facility]   PHYSICIAN:  Quita Skye. Hart Rochester, M.D.  DATE OF BIRTH:  15-May-1960   DATE OF PROCEDURE:  01/26/2005  DATE OF DISCHARGE:  01/26/2005                                 OPERATIVE REPORT   PREOPERATIVE DIAGNOSIS:  Recurrent thrombosis, left upper arm arteriovenous  Gore-Tex graft.   POSTOPERATIVE DIAGNOSIS:  Recurrent thrombosis, left upper arm arteriovenous  Gore-Tex graft.   OPERATION:  Thrombectomy of left upper arm arteriovenous Gore-Tex graft with  manual dilatation of central venous stenoses.   SURGEON:  Quita Skye. Hart Rochester, M.D.   FIRST ASSISTANT:  Jerold Coombe, P.A.   ANESTHESIA:  Local.   PROCEDURE:  The patient was taken to the operating room, placed in supine  position, at which time the left upper extremity was prepped with Betadine  scrub and solution, draped in the routine sterile manner.  After  infiltration of 1% Xylocaine with epinephrine, a longitudinal incision was  made near the axilla through the previous scar.  The Gore-Tex to axillary  vein anastomosis was quite high in the axilla and was very densely adhesed  and the vein was quite thick. Heparin 3000 units were given intravenously.  A transverse opening was made the graft proximal to the previous revision.  The graft itself was easily thrombectomized with excellent inflow being  reestablished.  Initially there was no backbleeding coming from the vein and  a Fogarty would not pass.  After passing a 3.5, 4 and 4.5-mm dilator, some  central venous lesions were manually dilated, followed by excellent  backbleeding.  A Fogarty catheter would then pass centrally into the central  veins.  Upon return, there was good backbleeding and no further clot.  The  graft was not felt to be revisable but it did seem to have some venous  outflow at  this point.  The graft was reclosed with two continuous 6-0 Prolene sutures, the clamp  released, and there was a good pulse and thrill in the graft.  No protamine  was given.  The wound was irrigated with saline, closed in layers with  Vicryl in a subcuticular fashion.  A sterile dressing applied.  The patient  taken to the recovery room in satisfactory condition.           ______________________________  Quita Skye Hart Rochester, M.D.     JDL/MEDQ  D:  01/26/2005  T:  01/27/2005  Job:  [id number]
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
## 2010-09-08 NOTE — Discharge Summary (Signed)
[facility]  Patient:    [name], [name]                       MRN: [id number] Adm. Date:  [id number] Disc. Date: [id number] Attending:  Devoria Albe Dictator:   Sheffield Slider, P.A.-C. CC:         Candy Sledge, M.D.  Larina Earthly, M.D.   Discharge Summary  ADMITTING PHYSICIAN:  Candy Sledge, M.D.  DISCHARGING PHYSICIAN:  Wilber Bihari. Caryn Section, M.D.  DISCHARGE DIAGNOSES: 1. Right parietal cerebrovascular accident. 2. Chronic renal failure at end-stage level. 3. Type 2 diabetes mellitus. 4. Hypertension. 5. Iron deficiency anemia. 6. Pulmonary edema.  CONSULTATIONS: 1. Dyke Maes, M.D., nephrology. 2. Larina Earthly, M.D., CVTS.  PROCEDURES: 1. 2-D echocardiogram, May 20, 2000, showed overall left ventricular    systolic function normal, left ventricular EF was estimated at 55-65%.    Left ventricular wall thickness was increased in a pattern of moderate    concentric hypertrophy.  There is trivial aortic valvular regurgitation.    There is no echocardiologic evidence for a cardiac source of embolism. 2. Duplex scan carotid arteries, May 19, 2000, showed no significant ICA    stenosis bilaterally.  Anterior vertebral arterial flow bilaterally.    Technically limited secondary to vessel anatomy, mild plaque seen in the    bifurcation bilaterally. 3. MRI of the brain, May 21, 2000, showed moderate sized, acute,    nonhemorrhagic parietal lobe infarct.  Small infarcts within the right    corona, radiata, and centrum semiovale.  Significant intracranial    arteriosclerotic disease. 4. Placement of right IJ Ash catheter, May 21, 2000, Dr. Charlett Nose. 5. Placement of left arm AV Gore-Tex, May 22, 2000, Dr. Gretta Began.  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  Caryn Section for the first and last time in September 2001.  She was told at that time, she would eventually need hemodialysis and never returned and failed to do any follow-up appointments. She also had a mother named Marvene Strohm, who was on dialysis, but died a number of years ago.  In June 2001, she had an ultrasound done by [facility] which showed echogenic smallish kidneys in November 2001.  Creatinine was 8.7 and upon presentation to the hospital, it was 10.6, and she has had shortness of breath and anorexia.  The patient was actually admitted by Dr. Noreene Filbert on January 27, when she had been complaining of a right frontal headache for the past week, much worse in the day or two prior to admission.  She described a pressure sensation, had no history of migraines.  She has also noted mild weakness in her left arm and admits to some blurred vision.  She has a tendency to run into things.  She said her walking was slow but not otherwise altered.  She has no known prior history of CVA.  In the emergency room, a head CT showed a subacute right parietal lobe infarct, and she was admitted by Dr. Noreene Filbert.  Consult was called to Dr. Briant Cedar because of her chronic renal failure.  HOSPITAL COURSE:  The patient was followed by Dr. Noreene Filbert for several days as her primary physician.  He started her on a heparin drip and stroke work-up. Carotid studies and 2-D echocardiogram results were described above.  She  was eventually started on Plavix and baby aspirin and was discharged on the same. The big decision was whether or not to start dialysis.  She was reluctant to see videos.  She had a mild to moderate response to diuretics.  She ultimately did agree to start dialysis, and on the 29th she had a catheter placed by radiology with the initiation of dialysis.  That same day, and on the following day, a left arm graft was placed by CVTS.  She was followed by the case managers/social worker in the hospital,  and arrangements were made for her to start dialysis at the [facility], and [facility] will provide transportation.  The patient had problems with finances and procuring Norvasc.  She was ineligible for the special funds from [facility] due to previously using it.  Samples were given for Norvasc and Protonix from the office of [employer]; however, the patient will have to come up with the funds to pay for her Plavix.  Other labs during her hospitalization of importance were a stool for occult was negative x 1.  Liver function tests were within normal limits.  Phosphorus came down with the addition of phosphate ______ .  She had an iron saturation level of 15% and was started on a course of iron.  Her hepatitis B surface antigen was negative.  Her PTH was 380 which did not come back until after she was discharged, and she will be appropriately started on Calcijex at the outpatient center.  Hemoglobin was in the lower 8 range.  She was transfused on the 29th and the following hemoglobin was 12.2.  It leveled out at 10.5 on February 2.  Urinalysis during hospitalization, showed 20,000 colonies of multiple species.  There was no antibiotic intervention.  The patient was asymptomatic.  The patient was able to be discharged home on February 1 following dialysis.  DISCHARGE MEDICATIONS: 1. Baby aspirin 81 mg per day. 2. Plavix 75 mg per day. 3. Nephro-Vite 1 tablet per day. 4. Protonix 40 mg per day. 5. Calcium carbonate 500 mg 2 tablets with meals 3 times a day. 6. Norvasc 10 mg q.h.s.  DISCHARGE DIET:  90 gram protein, 2 gram sodium, 2 gram potassium diet.  Limit fluids to 5 cups per day.  She is advised to stay away from concentrated sweets.  Her dialysis schedule is Monday, Wednesday, Friday at 11:30 at [facility], and her discharge dialysis orders were called to the outpatient center. DD:  06/17/00 TD:  06/18/00 Job:  [id number]

## 2010-09-08 NOTE — Consult Note (Signed)
[place]. [facility]  Patient:    [name], [name]                       MRN: [id number] Proc. Date: 05/19/00 Adm. Date:  [id number] Attending:  Armanda Heritage                          Consultation Report  REFERRING PHYSICIAN:  Candy Sledge, M.D.  REASON FOR ADMISSION:  Chronic renal insufficiency.  HISTORY OF PRESENT ILLNESS:  This is a 50 year old female with a history of chronic renal insufficiency presenting with secondary diabetes and hypertension who was admitted earlier today for a right parietal stroke. Apparently, she has seen Dr. Caryn Section in the past and says she saw him for the first and last time in September 2001.  She was told at that time that she would eventually need hemodialysis and she never showed up again.  Overall, the patient is not a very good historian.  She denies any history of gross hematuria, kidney stones, or use of nonsteroidal medications.  She did have a mother named Zarra Geffert who was on dialysis but died a number of years ago.  Her renal failure was due to diabetes.  This patient has had diabetes of uncertain duration and hypertension of uncertain duration.  In June 2001, she had an ultrasound done by [facility] which showed echogenic smallish kidneys.  In November 2001, her serum creatinine was 8.7 and currently serum creatinine is 10.6.  She had been short of breath for one week and appetite poor for one to two weeks.  PAST MEDICAL HISTORY: 1. Diabetes. 2. Hypertension. 3. History of right knee surgery.  ALLERGIES:  None.  MEDICATIONS: 1. Lotensin unknown dose q.d. 2. Glucotrol unknown dose q.d.  SOCIAL HISTORY:  Nonsmoker.  Nondrinker.  She lives with her brother.  She has never married and no children.  FAMILY HISTORY:  Mother had kidney disease but died a number of years ago. She is unsure of why her mother died.  Positive for hypertension in father and brother.  REVIEW OF SYSTEMS:  Appetite has been  poor for one to two weeks.  Energy poor. Shortness of breath x 1 week.  She denies any chest pain or chest pressures. Denies any recent change in bowel habits specifically no melena or hematochezia.  Her left arm weakness has improved.  She has complained of some blurriness particularly in the left eye.  She has had no dysuria or obstructive-type symptoms.  No skin lesions.  PHYSICAL EXAMINATION:  VITAL SIGNS:  Blood pressure 180/100, respirations 28, pulse 105, temperature 97.2.  GENERAL:  [age]-year-old female who is in mild respiratory distress.  HEENT:  Sclerae are nonicteric.  Extraocular muscles are intact.  NECK:  Positive JVD.  HEART:  Regular with an S4 gallop, no murmur.  LUNGS:  Decreased breath sounds in the bases, bilateral crackles.  ABDOMEN:  Positive bowel sounds, nontender, nondistended, without hepatosplenomegaly.  BACK:  Presacral edema.  EXTREMITIES:  3+ edema.  NEUROLOGIC:  Absent Achilles reflexes.  Muscle strength is 5/5 and equal bilaterally at least at the present time.  There is some decreased pinprick sensation in both feet.  Pulses are 2/4 in the carotids, radials, femorals, dorsalis pedis, 1/4 in the posterior tibial.  IMAGING STUDY:  Chest x-ray shows pulmonary edema.  LABORATORIES:  Sodium 138, potassium 4.2, bicarb 15, BUN 58, creatinine 10.6, glucose 49, calcium 6.9, albumin  2.1.  Hemoglobin 8.4, white count 7.5, platelet count 450,000.  IMPRESSION: 1. Chronic renal insufficiency approaching end-stage renal disease most likely    secondary to diabetes and hypertension. 2. Pulmonary edema secondary to #1. 3. A low bicarb which is most likely secondary to metabolic acidosis from #1. 4. Anemia most likely secondary to #1. 5. Hypertension, currently not well controlled. 6. Recent right parietal stroke.  RECOMMENDATIONS: 1. We will check phosphorus and urinalysis and obtain the records from the    office to see what other workup  has been done.  We will save her left arm    in case she is opting for hemodialysis.  At the present time, she is unsure    if she wants to do hemodialysis but I think she will eventually decide to    do so. 2. Since she is unwilling to commit to dialysis tonight we will give her high    doses of Lasix 200 mg q.8h.  We will show her the dialysis video tapes,    repeat labs in the morning, check an EKG.  Thank you very much for the consult.  Follow the patient with you. DD:  05/19/00 TD:  05/19/00 Job: [id number]

## 2010-09-08 NOTE — Consult Note (Signed)
[name], KUIPERS NO.:  [id number]   MEDICAL RECORD NO.:  [id number]          PATIENT TYPE:  INP   LOCATION:  3305                         FACILITY:  [facility]   PHYSICIAN:  Gerri Spore B. Earlene Plater, M.D.  DATE OF BIRTH:  Oct 01, 1960   DATE OF CONSULTATION:  07/30/2005  DATE OF DISCHARGE:                                   CONSULTATION   REFERRING PHYSICIAN:  Judie Grieve, MD.   REASON FOR CONSULTATION:  History of vaginal bleeding and anemia.   HISTORY OF PRESENT ILLNESS:  Patient is seen at the request of Dr. Shannan Harper  for evaluation of heavy menstrual bleeding and associated anemia.  She has  known history of two small fibroids and had been recommended to her that she  have endometrial ablation last year due to the associated anemia with her  heavy menses.  Patient reports one day of heavy menstrual bleeding this  month when she developed associated lightheadedness and fatigue.  It was  noted at the dialysis center that patient was quite anemic.  She was  admitted, transfused and is being evaluated for all sources of blood loss.  She has had upper endoscopy thus far which, by the preliminary report,  appears to read small antral ulcer but no obvious active bleeding.  Pelvic  ultrasound shows two small fibroids, previously known in thin endometrial  stripe.   We had been making arrangements for endometrial ablation last year, however,  she was quite anemic and had received one dose of Lupron to stop the  bleeding.  Bleeding did stop, however, patient failed to follow up  thereafter.   PAST MEDICAL HISTORY:  1.  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  2.  Hypertension.  3.  History of stroke.  4.  History of heavy menses.  5.  Urinary tract infections.  6.  Knee surgery.   FAMILY HISTORY:  Noncontributory.   ALLERGIES:  DOXYCYCLINE.   MEDICATIONS:  Reviewed from the E-chart.   SOCIAL HISTORY:  Noncontributory.   PHYSICAL EXAMINATION:  GENERAL  APPEARANCE:  Alert and oriented in no acute  distress.  VITAL SIGNS:  Patient is noted to be afebrile.  Stable vital signs.  SKIN:  Warm and dry with good turgor.  PELVIC:  Exam is deferred as patient has been examined multiple times by  myself in the office and knowing her history, I do not think it is indicated  at this point.   ASSESSMENT:  Dysfunctional uterine bleeding and associated anemia.  Patient  is not currently bleeding, therefore treatment can be managed as an  outpatient.  She is also still undergoing potential additional evaluation  for other sources of gastrointestinal blood loss, perhaps with colonoscopy.  If this is the case, we can coordinate procedures and perform endometrial  ablation at the same time if possible.  Should additional bleeding occur in  the meantime, I recommend either Megace or Aygestin 5 mg tablets two to  three tablets daily as needed for bleeding.  Otherwise patient can follow up  with me as an outpatient and further arrangements be made therefore.  Gerri Spore B. Earlene Plater, M.D.  Electronically Signed     WBD/MEDQ  D:  07/30/2005  T:  07/30/2005  Job:  [id number]

## 2010-09-08 NOTE — Consult Note (Signed)
[name], CHITTENDEN NO.:  [id number]   MEDICAL RECORD NO.:  [id number]          PATIENT TYPE:  INP   LOCATION:  5506                         FACILITY:  [facility]   PHYSICIAN:  Melvyn Novas, M.D.  DATE OF BIRTH:  Feb 18, 1961   DATE OF CONSULTATION:  07/31/2005  DATE OF DISCHARGE:                                   CONSULTATION   NEUROLOGY CONSULTATION:   REASON FOR CONSULTATION:  Consult requested by Dr. Caryn Section regarding  antiplatelet therapy questions for this 50 year old female with a history of  stroke in the year 2002 who now presented to the [facility] after  dropping her hemoglobin to a level of 5.__________.  The patient is admitted  for a GI workup to document possible hidden sources for GI bleed but it  appears that she complains mainly of dysmenorrhagia, usually heavy, that  lasts for about 5 days.   The patient is alert/oriented.  She states that she suffered a right  parietal stroke (ischemic) in January 2002 (details were available on E-  chart).  At the time she saw Dr. Fransisca Connors.  She had one follow-up  appointment at [facility].  There are no further office records available.  She had  been on aspirin for stroke prophylaxis since that time.  At the same time  she already suffered from end-stage renal disease and had a documented  history of diabetes mellitus and hypertension.  The patient who is now  admitted with this low hemoglobin states that she has no living children and  that she had suffered one miscarriage in her early 53s.  She states that her  vaginal bleeding started at the same time as her regular menstrual period  but became very heavy and lasted for about 5 days.  She felt fainty  especially when she had her dialysis treatment at the same time.  During  hemodialysis she had been feeling very fatigued and fainty.  She states  that afterwards on July 28, 2005 she was very dizzy and when her hemoglobin  was checked it was  suggested for her to present to the hospital by a  caretaker.  I do not know if this took place at home where she has home  health care or in the dialysis unit.   REVIEW OF SYSTEMS:  The patient denies chest pain, fatigue, headaches, GI  discomfort.  She states she has some tightness around her esophagus and  yesterday had an upper GI scope.   PAST MEDICAL HISTORY:  Past medical history is positive for end-stage renal  disease, dysmenorrhagia,  hypertension, diabetes mellitus.   MEDICATIONS:  The patient is on an insulin sliding scale.  She takes NovoLog  insulin.  She is also on Glucotrol 10 mg b.i.d. and a.c.  She is taking  Actos 45 mg daily, Aranesp 200 mcg every 7 days, Hectorol dose 0.75 mcg  given every 12 hours IV, labetalol 200 mg b.i.d., __________, Versed p.r.n.  procedures.  The patient also received fentanyl and Protonix.  She is on  Protonix b.i.d. 40 mg, Tylenol p.r.n., iron dextran complex was  given and GI  asked to discontinue the iron because of the patient's intolerance.  She was  also given MiraLax p.r.n.   SOCIAL HISTORY:  The patient is single, no children, disabled.   FAMILY HISTORY:  She states she had a family history of her mother becoming  dialysis dependent and her mother expired in 89.   ALLERGIES:  SHE IS ALLERGIC TO DOXYCYCLINE.   PHYSICAL EXAMINATION:  VITAL SIGNS:  The patient is afebrile.  Vital signs  are stable.  Temperature is 98.3 degrees Fahrenheit, heart rate is 95, blood  pressure was 150/90, the patient shows O2 saturation of 100% on room air,  respiratory rate is 18.  LUNGS:  Clear to auscultation.  COR:  Regular rate and rhythm.  No murmur.  No bruits.  ABDOMEN:  The patient is obese.  NEUROLOGICAL:  The patient is alert and oriented, has fluent speech, no  dysarthria, no aphasia.  There is also no visible facial asymmetry.  Tongue  and uvula are midline.  She shows full extraocular movements with  bilaterally equal reactive pupils.   The patient seems to have a mild  increase in tone through the left upper and lower extremity, deep tendon  reflexes were absent in all extremities but while her right plantar response  was equivocal she has an upgoing toe on the left.   She shows dysmetria on her finger-nose test on the left and there is grip  weakness on the left more than right.   ASSESSMENT:  There are no new neurologic changes since her stroke 5 years  ago.  The patient now seems to have suffered a uterine bleed secondary to  fibroids.  She is still in the workup to rule out a gastrointestinal source  that could also have contributed to her loss of hemoglobin.   PLAN:  Depending on if there is no GI source found and the loss of blood is  clearly attributed to the uterine fibroids the treatment of fibroids would  be the first goal.  The patient could stay on aspirin until 5 days prior to  surgery.  There is also a nonsurgical intervention possible avascular  ablation that can be performed if it is an isolated fibroid.  If the  fibroids are not the source of the bleed or not the only source of bleed and  she does have any positive findings during her colonoscopy tomorrow then  aspirin should be discontinued and not reused until the patient has built up  a normal H&H and she should probably be kept off aspirin for at least 8-10  weeks.  Again, if the fibroids are the source and are treated surgically or  with interventional procedure she can resume aspirin right away.      Melvyn Novas, M.D.  Electronically Signed     CD/MEDQ  D:  07/31/2005  T:  07/31/2005  Job:  [id number]   cc:   Dr. Caryn Section

## 2010-09-08 NOTE — Op Note (Signed)
[name], [name]              ACCOUNT NO.:  [id number]   MEDICAL RECORD NO.:  [id number]          PATIENT TYPE:  AMB   LOCATION:  SDS                          FACILITY:  [facility]   PHYSICIAN:  Janetta Hora. Fields, MD  DATE OF BIRTH:  [birth year]   DATE OF PROCEDURE:  01/27/2005  DATE OF DISCHARGE:                                 OPERATIVE REPORT   PROCEDURES:  1.  Ultrasound of neck.  2.  Right internal jugular vein Diatek catheter.   PREOPERATIVE DIAGNOSIS:  End-stage renal disease.   POSTOPERATIVE DIAGNOSIS:  End-stage renal disease.   ANESTHESIA:  Local with IV sedation.   SURGEON:  Janetta Hora. Fields, M.D.   ASSISTANT:  Nurse.   OPERATIVE FINDINGS:  A 23-cm Diatek catheter, right internal jugular vein.   OPERATIVE DETAILS:  After obtaining informed consent, the patient was taken  to the operating room.  The patient was placed in the supine position on the  operating table.  After adequate sedation, the patient's left neck was  inspected with a right ultrasound.  The left internal jugular vein was found  to be thrombosed.  Next, the right internal jugular vein was inspected.  The  right internal jugular vein was found to have normal compressibility and  respiratory variation.  Next, the patient's entire neck and chest were  prepped and draped in the usual sterile fashion.  Local anesthesia was  infiltrated over the right internal jugular vein.  A small finder needle was  used to localize the right internal jugular vein.  A larger introducer  needle was the used to cannulate the right internal jugular vein and a 0.035  J-tip guide wire threaded down through the right internal jugular vein  through the right atrium and into the inferior vena cava under fluoroscopic  guidance.  Next, sequential 12, 14, and 16-French dilators with a Peel-Away  sheath were placed over the guide wire into the right atrium.  The guide  wire and dilator was removed.  A 23-cm Diatek catheter was  threaded through  the Peel-Away sheath into the right atrium.  The Peel-Away sheath was then  removed.  Next, the catheter was tunneled out subcutaneously and cut to  length.  The hub was attached.  The catheter was noted to flush and draw  easily.  The catheter inspected under fluoroscopy and found its tip to be in  the right atrium and no kinks throughout its course.  The catheter was then  sutured at the skin with nylon sutures.  The neck insertion site was closed  with a Vicryl stitch.  The catheter was loaded with concentrated heparin  solution.  The patient tolerated the procedure well, and there were no  complications.  The instrument, sponge, and needle count was correct at the  end of the case.  The patient was taken to the recovery room in stable  condition.          ______________________________  Janetta Hora Fields, MD    CEF/MEDQ  D:  01/27/2005  T:  01/27/2005  Job:  [id number]

## 2010-09-08 NOTE — Op Note (Signed)
NAMEEUN, VERMEER NO.:  [id number]   MEDICAL RECORD NO.:  [id number]          PATIENT TYPE:  INP   LOCATION:  5506                         FACILITY:  [facility]   PHYSICIAN:  Petra Kuba, M.D.    DATE OF BIRTH:  [birth year]   DATE OF PROCEDURE:  08/01/2005  DATE OF DISCHARGE:                                 OPERATIVE REPORT   PROCEDURE PERFORMED:  Colonoscopy.   ENDOSCOPIST:  Petra Kuba, M.D.   INDICATIONS FOR PROCEDURE:  Patient with questionable GI bleeding.  Consent  was signed after the risks, benefits, methods and options were thoroughly  discussed multiple times along the last few days in the hospital.   MEDICINES USED:  Fentanyl 100 mcg, Versed 7.5 mg.   DESCRIPTION OF PROCEDURE:  Rectal inspection was pertinent for external  hemorrhoids, small.  Digital exam was negative.  A video pediatric  adjustable colonoscope was inserted and easily advanced around the colon to  the cecum.  This did require some abdominal pressure and rolling her on her  back.  No abnormalities or signs of bleeding were seen at insertion.  The  cecum was identified by the appendiceal orifice and the ileocecal valve.  In  fact, the scope was inserted a short ways in the terminal ileum which was  normal.  No blood was seen coming from above.  The scope was slowly  withdrawn.  On slow withdrawal through the colon, the prep was adequate.  There was minimal liquid stool that required washing and suctioning.  On  slow withdrawal back in the rectum, no abnormalities were seen.  Once back  in the rectum, anorectal pullthrough and retroflexion confirmed some small  hemorrhoids.  Scope was straightened and readvanced a short ways up the left  side of the colon, air was suctioned, scope removed.  The patient tolerated  the procedure well.  There was no immediate obvious complication.   ENDOSCOPIC DIAGNOSIS:  1.  Internal and external small hemorrhoids.  2.  Otherwise within normal  limits to the terminal ileum without any blood      being seen.   PLAN:  Okay to advance diet.  Hold GI work-up for now.  Call if any  questions or problems.  Might consider a capsule endoscopy if signs of GI  bleeding continue.  In the meantime, continue to treat ulcer, avoid aspirin  and nonsteroidals.           ______________________________  Petra Kuba, M.D.     MEM/MEDQ  D:  08/01/2005  T:  08/01/2005  Job:  [id number]   cc:   Wilber Bihari. Caryn Section, M.D.  Fax: [phone]

## 2010-09-08 NOTE — Op Note (Signed)
[place]. [facility]  Patient:    [name], [name]                       MRN: [id number] Proc. Date: 05/22/00 Attending:  Larina Earthly, M.D.                           Operative Report  PREOPERATIVE DIAGNOSIS:  End-stage renal disease.  POSTOPERATIVE DIAGNOSIS:  End-stage renal disease.  OPERATION PERFORMED:  Placement of left forearm loop arteriovenous Gore-Tex graft.  SURGEON:  Larina Earthly, M.D.  ASSISTANT:  Areta Haber, P.A.  ANESTHESIA:  0.5% lidocaine with epinephrine local with IV sedation.  COMPLICATIONS:  None.  DISPOSITION:  To recovery room stable.  DESCRIPTION OF PROCEDURE:  The patient was taken to the operating room and placed in supine position where the area of the left arm was prepped and draped in the usual sterile fashion.  Using local anesthesia, incision was made over the antecubital space and carried down to isolate the brachial artery and brachial vein.  The patients basilic and cephalic veins were quite small.  Next a separate incision was made over the distal forearm and a loop configuration tunnel was created.  A 6 mm stretch standard wall graft was brought through the tunnel.  The vein was occluded proximally and distally and was opened with the 11 blade and extended longitudinally with Potts scissors. The graft was spatulated and sewn end-to-side to the brachial vein with a running 6-0 Prolene suture.  Clamps were removed from the vein and the graft flushed with heparinized saline and reoccluded.  Next, the brachial artery was occluded proximally and distally, opened with an 11 blade and extended longitudinally with Potts scissors.  Graft was cut to the appropriate length and dimension and sewn end-to-side to the artery with a running 6-0 Prolene suture.  Clamps were removed and good thrill was noted.  The wound was irrigated with saline and hemostasis electrocautery.  Wounds closed 3-0 Vicryl in subcutaneous and  subcuticular tissue.  Benzoin and Steri-Strips were applied. DD:  05/22/00 TD:  05/23/00 Job: [id number]

## 2010-09-08 NOTE — Op Note (Signed)
NAMEHEIDEE, AUDI NO.:  [id number]   MEDICAL RECORD NO.:  [id number]                   PATIENT TYPE:  OIB   LOCATION:  2854                                 FACILITY:  [facility]   PHYSICIAN:  Quita Skye. Hart Rochester, M.D.               DATE OF BIRTH:  August 11, 1960   DATE OF PROCEDURE:  04/07/2003  DATE OF DISCHARGE:  04/07/2003                                 OPERATIVE REPORT   PREOPERATIVE DIAGNOSIS:  Thrombosed arteriovenous Gore-Tex graft, left  forearm with end stage renal disease.   POSTOPERATIVE DIAGNOSIS:  Thrombosed arteriovenous Gore-Tex graft, left  forearm with end stage renal disease.   OPERATION:  Thrombectomy arteriovenous Gore-Tex graft left arm with  insertion of a new segment of graft from existing graft to brachial vein  with 8 mm Gore-Tex.   SURGEON:  Quita Skye. Hart Rochester, M.D.   FIRST ASSISTANT:  Carolyn A. Eustaquio Boyden.   ANESTHESIA:  Local.   PROCEDURE:  The patient was taken to the operating room and placed in the  supine position at which time the left upper extremity was prepped with  Betadine scrubbing solution and draped in routine sterile manner.  After  infiltration with 1% Xylocaine with epinephrine, a longitudinal incision was  made through the previous scar in the mid upper arm.  The Gore-Tex was  dissected free proximally and distally and 3000 units of heparin given  intravenously.  A transverse opening was made in the graft proximal to the  venous anastomosis.  The graft, itself, was easily thrombectomized.  There  was intimal hyperplasia at the venous anastomosis causing almost total  obstruction, the vein proximal to this was 4.5 mm in size.  The graft was  transected and a new piece of 6 mm graft anastomosed end-to-end to the old  graft and end-to-end to the proximal vein with 6-0 Prolene.  The clamp was  then released and there was an excellent pulse and thrill in the graft.  No  protamine was given.  The wound was  irrigated with saline and closed in  layers with Vicryl in a subcuticular fashion and Steri-Strips.  A sterile  dressing was applied.  The patient was taken to the recovery room in  satisfactory condition.                                               Quita Skye Hart Rochester, M.D.    JDL/MEDQ  D:  04/07/2003  T:  04/07/2003  Job:  [id number]

## 2010-09-08 NOTE — Op Note (Signed)
NAMEJACKSON, [name] NO.:  [id number]   MEDICAL RECORD NO.:  [id number]           PATIENT TYPE:   LOCATION:                                 FACILITY:   PHYSICIAN:  Petra Kuba, M.D.         DATE OF BIRTH:   DATE OF PROCEDURE:  07/30/2005  DATE OF DISCHARGE:                                 OPERATIVE REPORT   PROCEDURE:  EGD with biopsy.   INDICATIONS:  Guaiac positivity, anemia.   CONSENT:  Signed after risks, benefits, methods, options thoroughly  discussed with the patient, yesterday, in the hospital; and, today, prior to  any sedation.   MEDICINES:  Fentanyl 50 mcg, Versed 5 mg.   DESCRIPTION OF PROCEDURE:  The video endoscope was inserted by direct  vision.  The esophagus was normal.  Scope passed into the stomach and  advanced to the antrum where a tiny antral ulcer was seen.  It did have a  flat black spot on it, without any at risk stigmata.  The scope passed  through a normal pylorus into a normal duodenal bulb, and around C-loop to a  normal second portion of the duodenum.  No blood was seen distally.  Scope  was slowly withdrawn back to the bulb, again; and a good look there was  normal.  Scope was withdrawn back to the stomach and retroflexed.  Angularis, cardia, fundus, lesser and greater curve were normal on retroflex  visualization.  Straight visualization did not reveal any additional  findings.  The ulcer was washed and watched.  It could not be made to bleed.  Two biopsies of the antrum, and 2 of the proximal stomach were obtained to  rule out Helicobacter.  Air and water were suctioned.  Scope removed, again,  a good look at the esophagus was normal.  Scope was removed.  The patient  tolerated the procedure well.  There was no obvious immediate complication.   ENDOSCOPIC DIAGNOSES:  1.  Tiny to small antral ulcer with a flat black spot.  2.  Otherwise normal EGD without any blood being seen, status post biopsy to      rule out  Helicobacter.   PLAN:  Pump inhibitors.  Treat Helicobacter if positive, hold aspirin as  long as possible.  Hold colonoscopy for now, but proceed p.r.n. signs of  continued blood loss in the GI tract or if the renal team feels like he  needs to be done as an inpatient this admission, please let me know.           ______________________________  Petra Kuba, M.D.     MEM/MEDQ  D:  07/30/2005  T:  07/30/2005  Job:  [id number]   cc:   Maree Krabbe, M.D.  Fax: [phone]

## 2010-09-08 NOTE — Op Note (Signed)
[facility]  Patient:    [name], [name]                       MRN: [id number] Proc. Date: 11/05/00 Adm. Date:  [id number] Attending:  Bennye Alm                           Operative Report  PREOPERATIVE DIAGNOSES: 1. Chronic renal failure. 2. Clotted left forearm arteriovenous graft.  POSTOPERATIVE DIAGNOSES: 1. Chronic renal failure. 2. Left clotted forearm arteriovenous graft.  PROCEDURES: 1. Thrombectomy in left forearm arteriovenous graft. 2. Exploration of arterial anastomosis. 3. Revision of graft with insertion of new segment in more proximal    brachial vein.  SURGEON:  Di Kindle. Edilia Bo, M.D.  ASSISTANT:  Sherrie George, P.A.  ANESTHESIA:  Local with sedation.  DESCRIPTION OF PROCEDURE:  The patient was taken to the operating room and sedated by anesthesia.  The left upper extremity was prepped and draped in the usual sterile fashion.  After the skin was infiltrated with 1% lidocaine, a transverse incision was made at the antecubital level, and the arterial and venous limbs of the graft were dissected free.  Next, the arterial limb of the graft was divided and the proximal aspect of the graft clamped.  Graft thrombectomy was achieved using a #4 Fogarty catheter.  The venous limb of the graft was also divided, and the thrombectomy catheter was passed in both directions and all clot was retrieved.  There was no problem in pulling the catheter through the body of the graft.  There was marked intimal hyperplasia at the venous anastomosis, which was an end-to-side anastomosis to a scarred basilic vein.  A remnant of graft was left on the vein, and this was oversewn with a 6-0 Prolene suture.  It was decided we would have to jump higher on a higher, more proximal vein.  Next, a tourniquet was placed on the arm and after the patient had been heparinized, the tourniquet was inflated to 250 mmHg after the arm was  exsanguinated.  The arterial anastomosis was directly inspected and was widely patent.  It took a 3 mm dilator without difficulty. The arterial limb of the graft was then sewn back end-to-end using continuous 6-0 Prolene suture.  Next, a separate longitudinal incision was made in the upper arm after the skin was anesthetized and here, the largest of the two brachial veins was selected.  This was not a very large vein.  We also looked for an adjacent basilic vein, and none was identifiable.  Therefore, it was felt that this was the best source of outflow.  A tunnel was created between the two incisions and a 6 mm graft passed between the two incisions.  The graft was spatulated.  The vein was divided and then spatulated proximally. It did take a 4 mm dilator.  It irrigated up fairly well with heparinized saline.  The graft was then sewn end-to-end to the brachial vein using continuous 6-0 Prolene suture.  The graft was then pulled to the appropriate length for anastomosis to the old graft.  This was done end-to-end with continuous 6-0 Prolene suture.  At completion there was an adequate thrill in the graft.  It was felt that if this graft clots in the future, she will need a new upper arm graft and an Ash catheter.  Hemostasis was obtained in the wounds.  The wounds were closed  with a deep layer of 3-0 Vicryl and the skin closed with 4-0 Vicryl.  A sterile dressing was applied.  The patient tolerated the procedure well, was transferred to the recovery room in satisfactory condition.  All needle and sponge counts were correct. DD:  11/05/00 TD:  11/06/00 Job: [id number]

## 2010-09-08 NOTE — Op Note (Signed)
[name], RAMTHUN NO.:  [id number]   MEDICAL RECORD NO.:  [id number]          PATIENT TYPE:  OIB   LOCATION:  2899                         FACILITY:  [facility]   PHYSICIAN:  Quita Skye. Hart Rochester, M.D.  DATE OF BIRTH:  02/05/1961   DATE OF PROCEDURE:  09/08/2004  DATE OF DISCHARGE:                                 OPERATIVE REPORT   PREOPERATIVE DIAGNOSIS:  Thrombosed AV Gore-Tex graft, left upper arm.   POSTOPERATIVE DIAGNOSIS:  Thrombosed AV Gore-Tex graft, left upper arm.   OPERATION:  Thrombectomy, AV Gore-Tex graft left upper arm; with insertion  of new six segment of 6 mm Gore-Tex from existing graft axillary vein.   SURGEON:  Quita Skye. Hart Rochester, M.D.   ASSISTANT:  Jerold Coombe, P.A.   ANESTHESIA:  Local.   PROCEDURE:  The patient was taken to the operating room, placed in the  supine position, at which time the left upper extremity was prepped with  Betadine scrub solution and draped in routine sterile manner. After  infiltration of 1% Xylocaine with epinephrine, a longitudinal incision was  made through the previous scar near the axilla. The Gore-Tex axillary vein  anastomosis, which was end-to-side , was dissected free proximally and  distally and 3000 units heparin given intravenously. Transverse opening was  made in the graft adjacent to the anastomosis.  The graft itself was easily  thrombectomized. The vein was ligated distally. There was intimal  hyperplasia extending up the vein about 2-3 cm with significant thickening.  This was all excised; the vein proximal to this was 4.5 mm in size and more  smooth. A dilator would traverse the more central vein 4.5 mm in size. The  vein was spatulated and a new piece of 6 mm graft was anastomosed end-to-end  to the old graft; after spatulating the graft end-to-end to the proximal  axillary vein with  6-0 Prolene. The clamp was then released; there  was  excellent pulse and thrill in the graft.  No protamine  was given.  The wound  was irrigated with saline, closed in layers with Vicryl in subcuticular  fashion. Sterile dressing applied. The patient taken to the recovery room in  satisfactory condition.      JDL/MEDQ  D:  09/08/2004  T:  09/08/2004  Job:  [id number]

## 2010-10-01 ENCOUNTER — Ambulatory Visit (INDEPENDENT_AMBULATORY_CARE_PROVIDER_SITE_OTHER): Payer: Medicaid Other

## 2010-10-01 ENCOUNTER — Inpatient Hospital Stay (INDEPENDENT_AMBULATORY_CARE_PROVIDER_SITE_OTHER)
Admission: RE | Admit: 2010-10-01 | Discharge: 2010-10-01 | Disposition: A | Discharge status: Home / Self Care | Payer: Medicare Other | Source: Ambulatory Visit | Attending: Family Medicine | Admitting: Family Medicine

## 2010-10-01 ENCOUNTER — Emergency Department (HOSPITAL_COMMUNITY)
Admission: EM | Admit: 2010-10-01 | Discharge: 2010-10-01 | Disposition: A | Discharge status: Home / Self Care | Payer: Medicare Other | Attending: Emergency Medicine | Admitting: Emergency Medicine

## 2010-10-01 DIAGNOSIS — E119 Type 2 diabetes mellitus without complications: Secondary | ICD-10-CM | POA: Insufficient documentation

## 2010-10-01 DIAGNOSIS — Z79899 Other long term (current) drug therapy: Secondary | ICD-10-CM | POA: Insufficient documentation

## 2010-10-01 DIAGNOSIS — Z7982 Long term (current) use of aspirin: Secondary | ICD-10-CM | POA: Insufficient documentation

## 2010-10-01 DIAGNOSIS — J189 Pneumonia, unspecified organism: Secondary | ICD-10-CM

## 2010-10-01 DIAGNOSIS — I10 Essential (primary) hypertension: Secondary | ICD-10-CM

## 2010-10-01 DIAGNOSIS — N186 End stage renal disease: Secondary | ICD-10-CM | POA: Insufficient documentation

## 2010-10-01 DIAGNOSIS — R05 Cough: Secondary | ICD-10-CM | POA: Insufficient documentation

## 2010-10-01 DIAGNOSIS — I120 Hypertensive chronic kidney disease with stage 5 chronic kidney disease or end stage renal disease: Secondary | ICD-10-CM | POA: Insufficient documentation

## 2010-10-01 DIAGNOSIS — Z8673 Personal history of transient ischemic attack (TIA), and cerebral infarction without residual deficits: Secondary | ICD-10-CM | POA: Insufficient documentation

## 2010-10-01 DIAGNOSIS — R059 Cough, unspecified: Secondary | ICD-10-CM | POA: Insufficient documentation

## 2010-10-01 DIAGNOSIS — Z992 Dependence on renal dialysis: Secondary | ICD-10-CM | POA: Insufficient documentation

## 2010-10-01 LAB — CBC
HCT: 32.1 % — ABNORMAL LOW (ref 36.0–46.0)
Hemoglobin: 10.7 g/dL — ABNORMAL LOW (ref 12.0–15.0)
MCV: 88.2 fL (ref 78.0–100.0)
RBC: 3.64 MIL/uL — ABNORMAL LOW (ref 3.87–5.11)
WBC: 8.3 10*3/uL (ref 4.0–10.5)

## 2010-10-01 LAB — BASIC METABOLIC PANEL
BUN: 41 mg/dL — ABNORMAL HIGH (ref 6–23)
CO2: 26 mEq/L (ref 19–32)
Chloride: 94 mEq/L — ABNORMAL LOW (ref 96–112)
Creatinine, Ser: 11.56 mg/dL — ABNORMAL HIGH (ref 0.4–1.2)
Glucose, Bld: 134 mg/dL — ABNORMAL HIGH (ref 70–99)
Potassium: 4.5 mEq/L (ref 3.5–5.1)

## 2010-10-01 LAB — DIFFERENTIAL
Basophils Absolute: 0.1 10*3/uL (ref 0.0–0.1)
Eosinophils Relative: 8 % — ABNORMAL HIGH (ref 0–5)
Lymphocytes Relative: 24 % (ref 12–46)
Lymphs Abs: 2 10*3/uL (ref 0.7–4.0)
Neutro Abs: 5.3 10*3/uL (ref 1.7–7.7)
Neutrophils Relative %: 63 % (ref 43–77)

## 2010-11-30 ENCOUNTER — Encounter (HOSPITAL_COMMUNITY)
Admission: RE | Admit: 2010-11-30 | Discharge: 2010-11-30 | Disposition: A | Discharge status: Home / Self Care | Payer: Medicare Other | Source: Ambulatory Visit | Attending: Surgery | Admitting: Surgery

## 2010-11-30 ENCOUNTER — Ambulatory Visit (HOSPITAL_COMMUNITY)
Admission: RE | Admit: 2010-11-30 | Discharge: 2010-11-30 | Disposition: A | Discharge status: Home / Self Care | Payer: Medicare Other | Source: Ambulatory Visit | Attending: Surgery | Admitting: Surgery

## 2010-11-30 ENCOUNTER — Other Ambulatory Visit (INDEPENDENT_AMBULATORY_CARE_PROVIDER_SITE_OTHER): Payer: Self-pay | Admitting: Surgery

## 2010-11-30 DIAGNOSIS — N186 End stage renal disease: Secondary | ICD-10-CM

## 2010-11-30 DIAGNOSIS — Z01810 Encounter for preprocedural cardiovascular examination: Secondary | ICD-10-CM | POA: Insufficient documentation

## 2010-11-30 DIAGNOSIS — Z01812 Encounter for preprocedural laboratory examination: Secondary | ICD-10-CM | POA: Insufficient documentation

## 2010-11-30 DIAGNOSIS — Z01818 Encounter for other preprocedural examination: Secondary | ICD-10-CM | POA: Insufficient documentation

## 2010-11-30 LAB — SURGICAL PCR SCREEN: MRSA, PCR: NEGATIVE

## 2010-12-07 ENCOUNTER — Other Ambulatory Visit (INDEPENDENT_AMBULATORY_CARE_PROVIDER_SITE_OTHER): Payer: Self-pay | Admitting: Surgery

## 2010-12-07 ENCOUNTER — Inpatient Hospital Stay (HOSPITAL_COMMUNITY)
Admission: RE | Admit: 2010-12-07 | Discharge: 2010-12-10 | DRG: 674 | Disposition: A | Discharge status: Home / Self Care | Payer: Medicare Other | Source: Ambulatory Visit | Attending: Surgery | Admitting: Surgery

## 2010-12-07 DIAGNOSIS — Z01810 Encounter for preprocedural cardiovascular examination: Secondary | ICD-10-CM

## 2010-12-07 DIAGNOSIS — N186 End stage renal disease: Secondary | ICD-10-CM

## 2010-12-07 DIAGNOSIS — Z992 Dependence on renal dialysis: Secondary | ICD-10-CM

## 2010-12-07 DIAGNOSIS — I120 Hypertensive chronic kidney disease with stage 5 chronic kidney disease or end stage renal disease: Secondary | ICD-10-CM | POA: Diagnosis present

## 2010-12-07 DIAGNOSIS — E1129 Type 2 diabetes mellitus with other diabetic kidney complication: Secondary | ICD-10-CM | POA: Diagnosis present

## 2010-12-07 DIAGNOSIS — Z01812 Encounter for preprocedural laboratory examination: Secondary | ICD-10-CM

## 2010-12-07 DIAGNOSIS — N2581 Secondary hyperparathyroidism of renal origin: Principal | ICD-10-CM | POA: Diagnosis present

## 2010-12-07 DIAGNOSIS — Z8673 Personal history of transient ischemic attack (TIA), and cerebral infarction without residual deficits: Secondary | ICD-10-CM

## 2010-12-07 DIAGNOSIS — E211 Secondary hyperparathyroidism, not elsewhere classified: Secondary | ICD-10-CM

## 2010-12-07 DIAGNOSIS — D631 Anemia in chronic kidney disease: Secondary | ICD-10-CM | POA: Diagnosis present

## 2010-12-07 DIAGNOSIS — E1169 Type 2 diabetes mellitus with other specified complication: Secondary | ICD-10-CM | POA: Diagnosis present

## 2010-12-07 DIAGNOSIS — Z7982 Long term (current) use of aspirin: Secondary | ICD-10-CM

## 2010-12-07 DIAGNOSIS — Z79899 Other long term (current) drug therapy: Secondary | ICD-10-CM

## 2010-12-07 DIAGNOSIS — K219 Gastro-esophageal reflux disease without esophagitis: Secondary | ICD-10-CM | POA: Diagnosis present

## 2010-12-07 HISTORY — PX: OTHER SURGICAL HISTORY: SHX169

## 2010-12-07 LAB — CBC
HCT: 38.8 % (ref 36.0–46.0)
Hemoglobin: 12.9 g/dL (ref 12.0–15.0)
MCH: 28.4 pg (ref 26.0–34.0)
RBC: 4.54 MIL/uL (ref 3.87–5.11)

## 2010-12-07 LAB — RENAL FUNCTION PANEL
Albumin: 3.5 g/dL (ref 3.5–5.2)
BUN: 31 mg/dL — ABNORMAL HIGH (ref 6–23)
BUN: 35 mg/dL — ABNORMAL HIGH (ref 6–23)
CO2: 26 mEq/L (ref 19–32)
Chloride: 97 mEq/L (ref 96–112)
Chloride: 95 mEq/L — ABNORMAL LOW (ref 96–112)
Creatinine, Ser: 8.58 mg/dL — ABNORMAL HIGH (ref 0.50–1.10)
Glucose, Bld: 244 mg/dL — ABNORMAL HIGH (ref 70–99)
Glucose, Bld: 206 mg/dL — ABNORMAL HIGH (ref 70–99)
Potassium: 5.4 mEq/L — ABNORMAL HIGH (ref 3.5–5.1)

## 2010-12-07 LAB — DIFFERENTIAL
Basophils Absolute: 0.1 10*3/uL (ref 0.0–0.1)
Basophils Relative: 1 % (ref 0–1)
Lymphocytes Relative: 34 % (ref 12–46)
Monocytes Relative: 7 % (ref 3–12)
Neutro Abs: 4.2 10*3/uL (ref 1.7–7.7)
Neutrophils Relative %: 51 % (ref 43–77)

## 2010-12-07 LAB — COMPREHENSIVE METABOLIC PANEL
ALT: 21 U/L (ref 0–35)
AST: 22 U/L (ref 0–37)
CO2: 29 mEq/L (ref 19–32)
Chloride: 97 mEq/L (ref 96–112)
GFR calc non Af Amer: 6 mL/min — ABNORMAL LOW (ref >60)
Sodium: 136 mEq/L (ref 135–145)
Total Bilirubin: 0.5 mg/dL (ref 0.3–1.2)

## 2010-12-07 LAB — HCG, SERUM, QUALITATIVE: Preg, Serum: NEGATIVE

## 2010-12-08 ENCOUNTER — Inpatient Hospital Stay (HOSPITAL_COMMUNITY): Payer: Medicare Other

## 2010-12-08 LAB — CBC
HCT: 32.4 % — ABNORMAL LOW (ref 36.0–46.0)
Platelets: 195 10*3/uL (ref 150–400)
RDW: 16.9 % — ABNORMAL HIGH (ref 11.5–15.5)
WBC: 12.6 10*3/uL — ABNORMAL HIGH (ref 4.0–10.5)

## 2010-12-08 LAB — RENAL FUNCTION PANEL
Albumin: 3.8 g/dL (ref 3.5–5.2)
BUN: 42 mg/dL — ABNORMAL HIGH (ref 6–23)
CO2: 25 meq/L (ref 19–32)
Calcium: 9.8 mg/dL (ref 8.4–10.5)
Chloride: 94 meq/L — ABNORMAL LOW (ref 96–112)
Creatinine, Ser: 10.06 mg/dL — ABNORMAL HIGH (ref 0.50–1.10)
GFR calc Af Amer: 5 mL/min — ABNORMAL LOW (ref >60)
GFR calc non Af Amer: 4 mL/min — ABNORMAL LOW (ref >60)
Glucose, Bld: 137 mg/dL — ABNORMAL HIGH (ref 70–99)
Phosphorus: 4.6 mg/dL (ref 2.3–4.6)
Potassium: 4.9 meq/L (ref 3.5–5.1)
Sodium: 134 meq/L — ABNORMAL LOW (ref 135–145)

## 2010-12-08 LAB — GLUCOSE, CAPILLARY
Glucose-Capillary: 95 mg/dL (ref 70–99)
Glucose-Capillary: 179 mg/dL — ABNORMAL HIGH (ref 70–99)
Glucose-Capillary: 150 mg/dL — ABNORMAL HIGH (ref 70–99)

## 2010-12-08 NOTE — Op Note (Signed)
NAMESHARIFA, [name] NO.:  [id number]  MEDICAL RECORD NO.:  [id number]  LOCATION:  6703                         FACILITY:  [facility]  PHYSICIAN:  Velora Heckler, MD      DATE OF BIRTH:  May 03, 1960  DATE OF PROCEDURE:  12/07/2010                               OPERATIVE REPORT   PREOPERATIVE DIAGNOSES:  Secondary hyperparathyroidism, end-stage renal disease.  POSTOPERATIVE DIAGNOSES:  Secondary hyperparathyroidism, end-stage renal disease.  PROCEDURE: 1. Total parathyroidectomy. 2. Autotransplantation parathyroid tissue to left brachioradialis     muscle.  SURGEON:  Velora Heckler, MD, FACS  ASSISTANT:  Ovidio Kin, MD, FACS  ANESTHESIA:  General per Dr. Autumn Patty.  ESTIMATED BLOOD LOSS:  Minimal.  PREPARATION:  ChloraPrep.  COMPLICATIONS:  None.  INDICATIONS:  The patient is a 50 year old black female from [place], [place].  She has end-stage renal disease due to diabetes mellitus and hypertension.  She began hemodialysis in February 2002. She dialyzes through an AV graft in the right thigh.  The patient had evidence of secondary hyperparathyroidism with elevated intact PTH level of 730, an elevated phosphorus level of 6.5, and a maximum elevation of her calcium level of 11.9.  The patient now comes to surgery for neck exploration, total parathyroidectomy, and autotransplantation.  BODY OF REPORT:  Procedure was done in OR #2 at the [facility].  The patient was brought to the operating room, placed in supine position on the operating room table.  Following administration of general anesthesia, the patient was positioned and then prepped and draped in the usual strict aseptic fashion.  After ascertaining that an adequate level of anesthesia had been achieved, a Kocher incision was made with a #15 blade.  Dissection was carried through subcutaneous tissues and platysma.  Hemostasis was obtained  with electrocautery.  Strap muscles were incised in the midline and dissection was begun on the left side.  Strap muscles were reflected laterally exposing the left thyroid lobe.  At the inferior pole of the left lobe, there is an enlarged parathyroid gland.  This was gently dissected out.  Vascular pedicles divided between Ligaclips and the gland was excised.  Biopsy of the gland was submitted to pathology and confirms parathyroid tissue.  Remainder of the left inferior gland was placed in iced saline on the back table.  Further dissection on the left side reveals an enlarged left superior parathyroid gland just posterior to the superior pole of the thyroid. This was gently dissected out.  Vascular pedicles divided between Ligaclips.  Gland was excised.  A biopsy shows parathyroid tissue on frozen section.  The remainder of the gland was placed in iced saline on the back table.  Dry pack was placed in the left neck.  Next, we turned our attention to the right side.  Again strap muscles were reflected laterally and right thyroid lobe was mobilized.  In the right superior position, there was an enlarged parathyroid gland which was dissected out.  It was mobilized completely.  Vascular pedicles were divided between Ligaclips and the gland was excised.  Biopsy confirms parathyroid tissue.  Remainder of the gland was placed in iced saline on the  back table.  Further dissection on the right reveals a nodular density on the inferior lateral pole of the thyroid.  This was within the thyroid capsule.  It was gently mobilized by incising the capsule.  This appears to be an enlarged parathyroid gland which was then mobilized.  Vascular pedicles were divided between Ligaclips and the gland was excised.  A large fragment was submitted to pathology and frozen section does confirm parathyroid tissue.  Remainder of the tissue was placed in iced saline on the back table.  Good hemostasis was  obtained on both sides of the neck.  Fibrillar was placed in the operative field bilaterally.  Strap muscles were reapproximated in the midline with interrupted 3-0 Vicryl sutures. Platysma was closed with interrupted 3-0 Vicryl sutures.  Skin was closed with running 4-0 Monocryl subcuticular suture.  Wound was washed and dried and Benzoin and Steri-Strips were applied.  Sterile dressings were applied.  Next, the left arm was placed on an arm board.  She was prepped and draped in the usual aseptic fashion.  After ascertaining that an adequate level of anesthesia been maintained, 5 cm incision was made over the left brachioradialis muscle.  Dissection was carried through subcutaneous tissues and hemostasis obtained with electrocautery.  Skin flaps were developed circumferentially and a Weitlaner retractor was placed for exposure.  The left inferior parathyroid gland was selected.  It was cut into 8 small 1 mm fragments which were then implanted into the left brachioradialis muscle.  The remainder of the parathyroid tissue was submitted to pathology for review.  The eight fragments were implanted into the left brachioradialis muscle by incising the muscle fascia, creating a submuscular pocket, inserting a 1-mm fragment, and closing the overlying fascia with interrupted 4-0 Prolene suture.  This exercise was repeated 8 times in the brachioradialis muscle.  The subcutaneous tissues were then closed with interrupted 3-0 Vicryl sutures.  The skin was closed with a running 4-0 Monocryl subcuticular suture.  The wound was washed and dried, Benzoin and Steri-Strips were applied.  Sterile dressings were applied.  The patient was awakened from anesthesia and brought to the recovery room. The patient tolerated the procedure well.   Velora Heckler, MD, FACS     TMG/MEDQ  D:  12/07/2010  T:  12/07/2010  Job:  [id number]  cc:   Duke Salvia. Eliott Nine, M.D.  Electronically Signed by Darnell Level MD on  12/08/2010 08:05:33 AM

## 2010-12-09 LAB — RENAL FUNCTION PANEL
BUN: 28 mg/dL — ABNORMAL HIGH (ref 6–23)
BUN: 36 mg/dL — ABNORMAL HIGH (ref 6–23)
CO2: 27 mEq/L (ref 19–32)
Calcium: 10.8 mg/dL — ABNORMAL HIGH (ref 8.4–10.5)
Chloride: 91 mEq/L — ABNORMAL LOW (ref 96–112)
Creatinine, Ser: 7.62 mg/dL — ABNORMAL HIGH (ref 0.50–1.10)
Creatinine, Ser: 8.41 mg/dL — ABNORMAL HIGH (ref 0.50–1.10)
Glucose, Bld: 98 mg/dL (ref 70–99)
Glucose, Bld: 193 mg/dL — ABNORMAL HIGH (ref 70–99)
Phosphorus: 4.1 mg/dL (ref 2.3–4.6)
Sodium: 134 mEq/L — ABNORMAL LOW (ref 135–145)

## 2010-12-09 LAB — GLUCOSE, CAPILLARY
Glucose-Capillary: 87 mg/dL (ref 70–99)
Glucose-Capillary: 177 mg/dL — ABNORMAL HIGH (ref 70–99)

## 2010-12-10 LAB — RENAL FUNCTION PANEL
BUN: 51 mg/dL — ABNORMAL HIGH (ref 6–23)
Calcium: 10.4 mg/dL (ref 8.4–10.5)
Creatinine, Ser: 10.37 mg/dL — ABNORMAL HIGH (ref 0.50–1.10)
GFR calc Af Amer: 5 mL/min — ABNORMAL LOW (ref >60)
Glucose, Bld: 198 mg/dL — ABNORMAL HIGH (ref 70–99)
Phosphorus: 3.9 mg/dL (ref 2.3–4.6)
Sodium: 131 mEq/L — ABNORMAL LOW (ref 135–145)

## 2010-12-11 LAB — GLUCOSE, CAPILLARY
Glucose-Capillary: 115 mg/dL — ABNORMAL HIGH (ref 70–99)
Glucose-Capillary: 188 mg/dL — ABNORMAL HIGH (ref 70–99)

## 2010-12-27 ENCOUNTER — Encounter (INDEPENDENT_AMBULATORY_CARE_PROVIDER_SITE_OTHER): Payer: Self-pay | Admitting: Surgery

## 2010-12-28 ENCOUNTER — Ambulatory Visit (INDEPENDENT_AMBULATORY_CARE_PROVIDER_SITE_OTHER): Payer: Medicare Other | Admitting: Surgery

## 2010-12-28 ENCOUNTER — Encounter (INDEPENDENT_AMBULATORY_CARE_PROVIDER_SITE_OTHER): Payer: Self-pay | Admitting: Surgery

## 2010-12-28 VITALS — BP 128/66 | HR 92

## 2010-12-28 DIAGNOSIS — N2581 Secondary hyperparathyroidism of renal origin: Secondary | ICD-10-CM

## 2010-12-28 NOTE — Progress Notes (Signed)
Visit Diagnoses: 1. Hyperparathyroidism, secondary     HISTORY: The patient is a 50 year old female who underwent neck exploration, total parathyroidectomy, and autotransplantation of parathyroid tissue to the left forearm in August 2012. She returns today for her first postoperative visit.   EXAM: Incisional left forearm has healed nicely. Steri-Strips are removed. No seroma. No infection. Incision on the neck has also healed well Steri-Strips are removed. No sign of infection. Voice quality is normal.   IMPRESSION: #1 end-stage renal disease #2 status post total parathyroidectomy with autotransplantation   PLAN: Patient will continue close followup with nephrology. She will return to see me in this office as needed. She will begin applying topical creams to her incisions.   Velora Heckler, MD, FACS General & Endocrine Surgery [facility]

## 2010-12-28 NOTE — Patient Instructions (Signed)
  COCOA BUTTER & VITAMIN E CREAM  ([employer] or other brand)  Apply cocoa butter/vitamin E cream to your incision 2 - 3 times daily.  Massage cream into incision for one minute with each application.  Use sunscreen (50 SPF or higher) for first 6 months after surgery.  You may substitute Mederma or other scar reducing creams as desired.   

## 2011-01-19 LAB — CBC
Platelets: 243
RBC: 3.78 — ABNORMAL LOW
WBC: 4.7

## 2011-01-22 LAB — GLUCOSE, CAPILLARY
Glucose-Capillary: 105 — ABNORMAL HIGH
Glucose-Capillary: 152 — ABNORMAL HIGH

## 2011-01-29 NOTE — Discharge Summary (Signed)
  NAMETAHARA, RUFFINI NO.:  [id number]  MEDICAL RECORD NO.:  [id number]  LOCATION:  6703                         FACILITY:  [facility]  PHYSICIAN:  Velora Heckler, MD      DATE OF BIRTH:  27-Nov-1960  DATE OF ADMISSION:  12/07/2010 DATE OF DISCHARGE:  12/10/2010                              DISCHARGE SUMMARY   REASON FOR ADMISSION:  Secondary hyperparathyroidism, end-stage renal disease.  BRIEF HISTORY:  The patient is a 50 year old black female from [place], [place] with end-stage renal disease due to diabetes mellitus and hypertension.  She has been on hemodialysis since February 2002.  She currently dialyzes through an AV graft in her right thigh. The patient has biochemical evidence of secondary hyperparathyroidism with intact PTH level exceeding 700, calcium level of 10.0, phosphorus level of 6.5.  The patient now comes to surgery for neck exploration and parathyroidectomy.  HOSPITAL COURSE:  The patient was admitted on December 07, 2010.  She was taken directly to the operating room where she underwent total parathyroidectomy with autotransplantation to the left brachioradialis muscle.  Postoperative course was relatively straightforward.  The patient had a significant fall in her serum calcium levels.  Phosphorus levels also fell postoperatively.  The patient tolerated a diet.  She was seen by Nephrology and had hemodialysis throughout her hospital stay.  The patient was prepared for discharge home on the third postoperative day.  DISCHARGE/PLAN:  The patient was discharged home on December 10, 2010. She was tolerating a renal diet.  Wound care instructions were given. The patient will follow up at the [facility] for dialysis. She will return to my office at [facility] in 2 to 3 weeks for wound check.  FINAL DIAGNOSES:  Secondary hyperparathyroidism, end-stage renal disease.  CONDITION AT DISCHARGE:  Good.     Velora Heckler, MD     TMG/MEDQ  D:  01/23/2011  T:  01/23/2011  Job:  [id number]  cc:   Duke Salvia. Eliott Nine, M.D.  Electronically Signed by Darnell Level MD on 01/29/2011 11:38:49 AM

## 2011-01-31 LAB — CROSSMATCH: ABO/RH(D): O POS

## 2011-02-01 LAB — RENAL FUNCTION PANEL
Albumin: 2.5 — ABNORMAL LOW
BUN: 28 — ABNORMAL HIGH
CO2: 26
CO2: 26
CO2: 27
Calcium: 10.5
Chloride: 90 — ABNORMAL LOW
Chloride: 94 — ABNORMAL LOW
Creatinine, Ser: 10.09 — ABNORMAL HIGH
Creatinine, Ser: 9.83 — ABNORMAL HIGH
GFR calc non Af Amer: 4 — ABNORMAL LOW
Glucose, Bld: 222 — ABNORMAL HIGH
Glucose, Bld: 92
Phosphorus: 4.8 — ABNORMAL HIGH
Phosphorus: 5.3 — ABNORMAL HIGH
Potassium: 4.2
Potassium: 5.1
Sodium: 129 — ABNORMAL LOW

## 2011-02-01 LAB — CBC
HCT: 20.9 — ABNORMAL LOW
HCT: 23.8 — ABNORMAL LOW
Hemoglobin: 6.2 — CL
Hemoglobin: 7.2 — CL
Hemoglobin: 7.8 — CL
MCHC: 32.6
MCHC: 32.8
MCV: 82.8
MCV: 83.1
Platelets: 504 — ABNORMAL HIGH
RBC: 2.32 — ABNORMAL LOW
RBC: 2.52 — ABNORMAL LOW
RBC: 2.62 — ABNORMAL LOW
RBC: 2.9 — ABNORMAL LOW
RDW: 19.4 — ABNORMAL HIGH
WBC: 10.2
WBC: 7.9

## 2011-02-01 LAB — CULTURE, BLOOD (ROUTINE X 2): Culture: NO GROWTH

## 2011-02-01 LAB — ANAEROBIC CULTURE

## 2011-02-01 LAB — URINALYSIS, ROUTINE W REFLEX MICROSCOPIC
Glucose, UA: 250 — AB
Ketones, ur: NEGATIVE
Leukocytes, UA: NEGATIVE
Nitrite: NEGATIVE
Protein, ur: >300 — AB
pH: 8

## 2011-02-01 LAB — C-REACTIVE PROTEIN: CRP: 15.8 — ABNORMAL HIGH (ref <0.6)

## 2011-02-01 LAB — COMPREHENSIVE METABOLIC PANEL
ALT: 20
AST: 16
Alkaline Phosphatase: 48
CO2: 28
Chloride: 94 — ABNORMAL LOW
GFR calc Af Amer: 6 — ABNORMAL LOW
GFR calc non Af Amer: 5 — ABNORMAL LOW
Glucose, Bld: 81
Potassium: 3.6
Sodium: 135
Total Bilirubin: 0.6

## 2011-02-01 LAB — TYPE AND SCREEN
ABO/RH(D): O POS
Antibody Screen: NEGATIVE

## 2011-02-01 LAB — BODY FLUID CULTURE

## 2011-02-01 LAB — PROTIME-INR
INR: 1.3
Prothrombin Time: 15.6 — ABNORMAL HIGH
Prothrombin Time: 16.4 — ABNORMAL HIGH

## 2011-02-01 LAB — CROSSMATCH

## 2011-02-01 LAB — PREPARE RBC (CROSSMATCH)

## 2011-02-01 LAB — DIFFERENTIAL
Basophils Absolute: 0.1
Basophils Relative: 1
Eosinophils Absolute: 0.3
Eosinophils Relative: 4
Lymphocytes Relative: 19
Monocytes Absolute: 0.9 — ABNORMAL HIGH

## 2011-02-01 LAB — URINE MICROSCOPIC-ADD ON

## 2011-02-01 LAB — URINE CULTURE
Colony Count: NO GROWTH
Culture: NO GROWTH

## 2011-04-08 ENCOUNTER — Emergency Department (INDEPENDENT_AMBULATORY_CARE_PROVIDER_SITE_OTHER)
Admission: EM | Admit: 2011-04-08 | Discharge: 2011-04-08 | Disposition: A | Discharge status: Home / Self Care | Payer: Medicare Other | Source: Home / Self Care | Attending: Family Medicine | Admitting: Family Medicine

## 2011-04-08 ENCOUNTER — Encounter (HOSPITAL_COMMUNITY): Payer: Self-pay

## 2011-04-08 DIAGNOSIS — S8010XA Contusion of unspecified lower leg, initial encounter: Secondary | ICD-10-CM

## 2011-04-08 DIAGNOSIS — S8011XA Contusion of right lower leg, initial encounter: Secondary | ICD-10-CM

## 2011-04-08 NOTE — ED Provider Notes (Signed)
History     CSN: [id number] Arrival date & time: 04/08/2011 11:30 AM   First MD Initiated Contact with Patient 04/08/11 1053      Chief Complaint  Patient presents with  . Leg Injury    (Consider location/radiation/quality/duration/timing/severity/associated sxs/prior treatment) Patient is a 50 y.o. female presenting with leg pain.  Leg Pain  The incident occurred yesterday. Incident location: fell on bus and  struck right lower leg. The injury mechanism was a fall. The pain is present in the right leg. The pain is mild. The pain has been improving (sts is resolving.) since onset. Pertinent negatives include no numbness, no inability to bear weight, no loss of motion and no loss of sensation. She reports no foreign bodies present. The symptoms are aggravated by palpation.    Past Medical History  Diagnosis Date  . ESRD (end stage renal disease)   . Type 2 diabetes mellitus   . Hypertension   . CVA (cerebral infarction)     right parietal 05/19/2000  . Vaginal bleeding   . Hyperparathyroidism     Past Surgical History  Procedure Date  . Parathyroidectomy with autotransplant 12/07/2010  . Knee surgery     right, x2  . Brachial artery graft     x2 for dialysis  . Av fistula placement     upper rt thigh    History reviewed. No pertinent family history.  History  Substance Use Topics  . Smoking status: Never Smoker   . Smokeless tobacco: Not on file  . Alcohol Use: Yes    OB History    Grav Para Term Preterm Abortions TAB SAB Ect Mult Living                  Review of Systems  Constitutional: Negative.   Musculoskeletal: Negative for gait problem.  Skin: Positive for wound.  Neurological: Negative for numbness.    Allergies  Doxycycline and Peroxyl  Home Medications   Current Outpatient Rx  Name Route Sig Dispense Refill  . AMLODIPINE BESYLATE 10 MG PO TABS Oral Take 10 mg by mouth daily.      . ASPIRIN 81 MG PO TABS Oral Take 81 mg by mouth daily.       Marland Kitchen CALCIUM CARBONATE ANTACID 500 MG PO CHEW Oral Chew 1 tablet by mouth daily.      Marland Kitchen ESOMEPRAZOLE MAGNESIUM 40 MG PO CPDR Oral Take 40 mg by mouth daily before breakfast.      . ETHYL CHLORIDE EX AERO Topical Apply topically as needed.      Marland Kitchen GLIPIZIDE 10 MG PO TABS Oral Take 10 mg by mouth 2 (two) times daily before a meal.      . TRAMADOL HCL 50 MG PO TABS Oral Take 50 mg by mouth every 6 (six) hours as needed.      Marland Kitchen VITAMIN B-12 100 MCG PO TABS Oral Take 50 mcg by mouth daily.        LMP 04/02/2011  Physical Exam  Constitutional: She appears well-developed and well-nourished.  Musculoskeletal: Normal range of motion. She exhibits tenderness.  Skin:       ED Course  Procedures (including critical care time)  Labs Reviewed - No data to display No results found.   1. Contusion of lower leg, right       MDM          Barkley Bruns, MD 04/08/11 1148

## 2011-04-08 NOTE — ED Notes (Signed)
Pt hit rt shin on step yesterday on bus, small hematoma with eraser size scrape in center.

## 2011-04-25 DIAGNOSIS — D509 Iron deficiency anemia, unspecified: Secondary | ICD-10-CM | POA: Diagnosis not present

## 2011-04-25 DIAGNOSIS — N2581 Secondary hyperparathyroidism of renal origin: Secondary | ICD-10-CM | POA: Diagnosis not present

## 2011-04-25 DIAGNOSIS — N186 End stage renal disease: Secondary | ICD-10-CM | POA: Diagnosis not present

## 2011-04-27 DIAGNOSIS — N186 End stage renal disease: Secondary | ICD-10-CM | POA: Diagnosis not present

## 2011-04-27 DIAGNOSIS — D509 Iron deficiency anemia, unspecified: Secondary | ICD-10-CM | POA: Diagnosis not present

## 2011-04-27 DIAGNOSIS — N2581 Secondary hyperparathyroidism of renal origin: Secondary | ICD-10-CM | POA: Diagnosis not present

## 2011-04-30 DIAGNOSIS — N2581 Secondary hyperparathyroidism of renal origin: Secondary | ICD-10-CM | POA: Diagnosis not present

## 2011-04-30 DIAGNOSIS — N186 End stage renal disease: Secondary | ICD-10-CM | POA: Diagnosis not present

## 2011-04-30 DIAGNOSIS — D509 Iron deficiency anemia, unspecified: Secondary | ICD-10-CM | POA: Diagnosis not present

## 2011-05-02 DIAGNOSIS — D509 Iron deficiency anemia, unspecified: Secondary | ICD-10-CM | POA: Diagnosis not present

## 2011-05-02 DIAGNOSIS — N2581 Secondary hyperparathyroidism of renal origin: Secondary | ICD-10-CM | POA: Diagnosis not present

## 2011-05-02 DIAGNOSIS — N186 End stage renal disease: Secondary | ICD-10-CM | POA: Diagnosis not present

## 2011-05-04 DIAGNOSIS — D509 Iron deficiency anemia, unspecified: Secondary | ICD-10-CM | POA: Diagnosis not present

## 2011-05-04 DIAGNOSIS — N186 End stage renal disease: Secondary | ICD-10-CM | POA: Diagnosis not present

## 2011-05-04 DIAGNOSIS — N2581 Secondary hyperparathyroidism of renal origin: Secondary | ICD-10-CM | POA: Diagnosis not present

## 2011-05-07 DIAGNOSIS — N186 End stage renal disease: Secondary | ICD-10-CM | POA: Diagnosis not present

## 2011-05-07 DIAGNOSIS — D509 Iron deficiency anemia, unspecified: Secondary | ICD-10-CM | POA: Diagnosis not present

## 2011-05-07 DIAGNOSIS — N2581 Secondary hyperparathyroidism of renal origin: Secondary | ICD-10-CM | POA: Diagnosis not present

## 2011-05-09 DIAGNOSIS — N186 End stage renal disease: Secondary | ICD-10-CM | POA: Diagnosis not present

## 2011-05-09 DIAGNOSIS — D509 Iron deficiency anemia, unspecified: Secondary | ICD-10-CM | POA: Diagnosis not present

## 2011-05-09 DIAGNOSIS — N2581 Secondary hyperparathyroidism of renal origin: Secondary | ICD-10-CM | POA: Diagnosis not present

## 2011-05-11 DIAGNOSIS — D509 Iron deficiency anemia, unspecified: Secondary | ICD-10-CM | POA: Diagnosis not present

## 2011-05-11 DIAGNOSIS — N2581 Secondary hyperparathyroidism of renal origin: Secondary | ICD-10-CM | POA: Diagnosis not present

## 2011-05-11 DIAGNOSIS — N186 End stage renal disease: Secondary | ICD-10-CM | POA: Diagnosis not present

## 2011-05-14 DIAGNOSIS — D509 Iron deficiency anemia, unspecified: Secondary | ICD-10-CM | POA: Diagnosis not present

## 2011-05-14 DIAGNOSIS — N186 End stage renal disease: Secondary | ICD-10-CM | POA: Diagnosis not present

## 2011-05-14 DIAGNOSIS — N2581 Secondary hyperparathyroidism of renal origin: Secondary | ICD-10-CM | POA: Diagnosis not present

## 2011-05-16 DIAGNOSIS — D509 Iron deficiency anemia, unspecified: Secondary | ICD-10-CM | POA: Diagnosis not present

## 2011-05-16 DIAGNOSIS — N2581 Secondary hyperparathyroidism of renal origin: Secondary | ICD-10-CM | POA: Diagnosis not present

## 2011-05-16 DIAGNOSIS — E1129 Type 2 diabetes mellitus with other diabetic kidney complication: Secondary | ICD-10-CM | POA: Diagnosis not present

## 2011-05-16 DIAGNOSIS — N186 End stage renal disease: Secondary | ICD-10-CM | POA: Diagnosis not present

## 2011-05-18 DIAGNOSIS — N2581 Secondary hyperparathyroidism of renal origin: Secondary | ICD-10-CM | POA: Diagnosis not present

## 2011-05-18 DIAGNOSIS — N186 End stage renal disease: Secondary | ICD-10-CM | POA: Diagnosis not present

## 2011-05-18 DIAGNOSIS — D509 Iron deficiency anemia, unspecified: Secondary | ICD-10-CM | POA: Diagnosis not present

## 2011-05-21 DIAGNOSIS — D509 Iron deficiency anemia, unspecified: Secondary | ICD-10-CM | POA: Diagnosis not present

## 2011-05-21 DIAGNOSIS — N186 End stage renal disease: Secondary | ICD-10-CM | POA: Diagnosis not present

## 2011-05-21 DIAGNOSIS — N2581 Secondary hyperparathyroidism of renal origin: Secondary | ICD-10-CM | POA: Diagnosis not present

## 2011-05-23 DIAGNOSIS — N186 End stage renal disease: Secondary | ICD-10-CM | POA: Diagnosis not present

## 2011-05-23 DIAGNOSIS — D509 Iron deficiency anemia, unspecified: Secondary | ICD-10-CM | POA: Diagnosis not present

## 2011-05-23 DIAGNOSIS — N2581 Secondary hyperparathyroidism of renal origin: Secondary | ICD-10-CM | POA: Diagnosis not present

## 2011-05-24 DIAGNOSIS — N186 End stage renal disease: Secondary | ICD-10-CM | POA: Diagnosis not present

## 2011-05-25 DIAGNOSIS — N186 End stage renal disease: Secondary | ICD-10-CM | POA: Diagnosis not present

## 2011-05-25 DIAGNOSIS — N2581 Secondary hyperparathyroidism of renal origin: Secondary | ICD-10-CM | POA: Diagnosis not present

## 2011-06-21 DIAGNOSIS — N186 End stage renal disease: Secondary | ICD-10-CM | POA: Diagnosis not present

## 2011-06-22 DIAGNOSIS — N2581 Secondary hyperparathyroidism of renal origin: Secondary | ICD-10-CM | POA: Diagnosis not present

## 2011-06-22 DIAGNOSIS — D631 Anemia in chronic kidney disease: Secondary | ICD-10-CM | POA: Diagnosis not present

## 2011-06-22 DIAGNOSIS — N186 End stage renal disease: Secondary | ICD-10-CM | POA: Diagnosis not present

## 2011-06-22 DIAGNOSIS — D509 Iron deficiency anemia, unspecified: Secondary | ICD-10-CM | POA: Diagnosis not present

## 2011-06-27 DIAGNOSIS — I959 Hypotension, unspecified: Secondary | ICD-10-CM | POA: Diagnosis not present

## 2011-06-27 DIAGNOSIS — R5382 Chronic fatigue, unspecified: Secondary | ICD-10-CM | POA: Diagnosis not present

## 2011-06-27 DIAGNOSIS — R5381 Other malaise: Secondary | ICD-10-CM | POA: Diagnosis not present

## 2011-07-04 DIAGNOSIS — I959 Hypotension, unspecified: Secondary | ICD-10-CM | POA: Diagnosis not present

## 2011-07-22 DIAGNOSIS — N186 End stage renal disease: Secondary | ICD-10-CM | POA: Diagnosis not present

## 2011-07-23 DIAGNOSIS — N186 End stage renal disease: Secondary | ICD-10-CM | POA: Diagnosis not present

## 2011-07-23 DIAGNOSIS — D509 Iron deficiency anemia, unspecified: Secondary | ICD-10-CM | POA: Diagnosis not present

## 2011-07-23 DIAGNOSIS — N2581 Secondary hyperparathyroidism of renal origin: Secondary | ICD-10-CM | POA: Diagnosis not present

## 2011-08-02 ENCOUNTER — Other Ambulatory Visit: Payer: Self-pay | Admitting: Nephrology

## 2011-08-02 DIAGNOSIS — Z1231 Encounter for screening mammogram for malignant neoplasm of breast: Secondary | ICD-10-CM

## 2011-08-09 DIAGNOSIS — Z124 Encounter for screening for malignant neoplasm of cervix: Secondary | ICD-10-CM | POA: Diagnosis not present

## 2011-08-15 DIAGNOSIS — R75 Inconclusive laboratory evidence of human immunodeficiency virus [HIV]: Secondary | ICD-10-CM | POA: Diagnosis not present

## 2011-08-15 DIAGNOSIS — E1129 Type 2 diabetes mellitus with other diabetic kidney complication: Secondary | ICD-10-CM | POA: Diagnosis not present

## 2011-08-15 DIAGNOSIS — A8030 Acute paralytic poliomyelitis, unspecified: Secondary | ICD-10-CM | POA: Diagnosis not present

## 2011-08-21 DIAGNOSIS — N186 End stage renal disease: Secondary | ICD-10-CM | POA: Diagnosis not present

## 2011-08-22 DIAGNOSIS — N2581 Secondary hyperparathyroidism of renal origin: Secondary | ICD-10-CM | POA: Diagnosis not present

## 2011-08-22 DIAGNOSIS — D509 Iron deficiency anemia, unspecified: Secondary | ICD-10-CM | POA: Diagnosis not present

## 2011-08-22 DIAGNOSIS — N186 End stage renal disease: Secondary | ICD-10-CM | POA: Diagnosis not present

## 2011-08-23 DIAGNOSIS — E1149 Type 2 diabetes mellitus with other diabetic neurological complication: Secondary | ICD-10-CM | POA: Diagnosis not present

## 2011-08-23 DIAGNOSIS — Q828 Other specified congenital malformations of skin: Secondary | ICD-10-CM | POA: Diagnosis not present

## 2011-08-23 DIAGNOSIS — L600 Ingrowing nail: Secondary | ICD-10-CM | POA: Diagnosis not present

## 2011-09-06 ENCOUNTER — Ambulatory Visit: Payer: Medicare Other

## 2011-09-21 DIAGNOSIS — N186 End stage renal disease: Secondary | ICD-10-CM | POA: Diagnosis not present

## 2011-09-24 DIAGNOSIS — D509 Iron deficiency anemia, unspecified: Secondary | ICD-10-CM | POA: Diagnosis not present

## 2011-09-24 DIAGNOSIS — N186 End stage renal disease: Secondary | ICD-10-CM | POA: Diagnosis not present

## 2011-09-24 DIAGNOSIS — E878 Other disorders of electrolyte and fluid balance, not elsewhere classified: Secondary | ICD-10-CM | POA: Diagnosis not present

## 2011-09-24 DIAGNOSIS — N2581 Secondary hyperparathyroidism of renal origin: Secondary | ICD-10-CM | POA: Diagnosis not present

## 2011-09-24 DIAGNOSIS — E1129 Type 2 diabetes mellitus with other diabetic kidney complication: Secondary | ICD-10-CM | POA: Diagnosis not present

## 2011-09-26 DIAGNOSIS — D509 Iron deficiency anemia, unspecified: Secondary | ICD-10-CM | POA: Diagnosis not present

## 2011-09-26 DIAGNOSIS — N2581 Secondary hyperparathyroidism of renal origin: Secondary | ICD-10-CM | POA: Diagnosis not present

## 2011-09-26 DIAGNOSIS — N186 End stage renal disease: Secondary | ICD-10-CM | POA: Diagnosis not present

## 2011-09-26 DIAGNOSIS — E1129 Type 2 diabetes mellitus with other diabetic kidney complication: Secondary | ICD-10-CM | POA: Diagnosis not present

## 2011-09-26 DIAGNOSIS — E878 Other disorders of electrolyte and fluid balance, not elsewhere classified: Secondary | ICD-10-CM | POA: Diagnosis not present

## 2011-09-28 DIAGNOSIS — N186 End stage renal disease: Secondary | ICD-10-CM | POA: Diagnosis not present

## 2011-09-28 DIAGNOSIS — E1129 Type 2 diabetes mellitus with other diabetic kidney complication: Secondary | ICD-10-CM | POA: Diagnosis not present

## 2011-09-28 DIAGNOSIS — E878 Other disorders of electrolyte and fluid balance, not elsewhere classified: Secondary | ICD-10-CM | POA: Diagnosis not present

## 2011-09-28 DIAGNOSIS — N2581 Secondary hyperparathyroidism of renal origin: Secondary | ICD-10-CM | POA: Diagnosis not present

## 2011-09-28 DIAGNOSIS — D509 Iron deficiency anemia, unspecified: Secondary | ICD-10-CM | POA: Diagnosis not present

## 2011-10-01 DIAGNOSIS — E878 Other disorders of electrolyte and fluid balance, not elsewhere classified: Secondary | ICD-10-CM | POA: Diagnosis not present

## 2011-10-01 DIAGNOSIS — N186 End stage renal disease: Secondary | ICD-10-CM | POA: Diagnosis not present

## 2011-10-01 DIAGNOSIS — N2581 Secondary hyperparathyroidism of renal origin: Secondary | ICD-10-CM | POA: Diagnosis not present

## 2011-10-01 DIAGNOSIS — D509 Iron deficiency anemia, unspecified: Secondary | ICD-10-CM | POA: Diagnosis not present

## 2011-10-01 DIAGNOSIS — E1129 Type 2 diabetes mellitus with other diabetic kidney complication: Secondary | ICD-10-CM | POA: Diagnosis not present

## 2011-10-03 DIAGNOSIS — E1129 Type 2 diabetes mellitus with other diabetic kidney complication: Secondary | ICD-10-CM | POA: Diagnosis not present

## 2011-10-03 DIAGNOSIS — D509 Iron deficiency anemia, unspecified: Secondary | ICD-10-CM | POA: Diagnosis not present

## 2011-10-03 DIAGNOSIS — E878 Other disorders of electrolyte and fluid balance, not elsewhere classified: Secondary | ICD-10-CM | POA: Diagnosis not present

## 2011-10-03 DIAGNOSIS — N2581 Secondary hyperparathyroidism of renal origin: Secondary | ICD-10-CM | POA: Diagnosis not present

## 2011-10-03 DIAGNOSIS — N186 End stage renal disease: Secondary | ICD-10-CM | POA: Diagnosis not present

## 2011-10-05 DIAGNOSIS — N186 End stage renal disease: Secondary | ICD-10-CM | POA: Diagnosis not present

## 2011-10-05 DIAGNOSIS — N2581 Secondary hyperparathyroidism of renal origin: Secondary | ICD-10-CM | POA: Diagnosis not present

## 2011-10-05 DIAGNOSIS — E1129 Type 2 diabetes mellitus with other diabetic kidney complication: Secondary | ICD-10-CM | POA: Diagnosis not present

## 2011-10-05 DIAGNOSIS — E878 Other disorders of electrolyte and fluid balance, not elsewhere classified: Secondary | ICD-10-CM | POA: Diagnosis not present

## 2011-10-05 DIAGNOSIS — D509 Iron deficiency anemia, unspecified: Secondary | ICD-10-CM | POA: Diagnosis not present

## 2011-10-08 DIAGNOSIS — N2581 Secondary hyperparathyroidism of renal origin: Secondary | ICD-10-CM | POA: Diagnosis not present

## 2011-10-08 DIAGNOSIS — E1129 Type 2 diabetes mellitus with other diabetic kidney complication: Secondary | ICD-10-CM | POA: Diagnosis not present

## 2011-10-08 DIAGNOSIS — N186 End stage renal disease: Secondary | ICD-10-CM | POA: Diagnosis not present

## 2011-10-08 DIAGNOSIS — E878 Other disorders of electrolyte and fluid balance, not elsewhere classified: Secondary | ICD-10-CM | POA: Diagnosis not present

## 2011-10-08 DIAGNOSIS — D509 Iron deficiency anemia, unspecified: Secondary | ICD-10-CM | POA: Diagnosis not present

## 2011-10-10 DIAGNOSIS — N186 End stage renal disease: Secondary | ICD-10-CM | POA: Diagnosis not present

## 2011-10-10 DIAGNOSIS — E878 Other disorders of electrolyte and fluid balance, not elsewhere classified: Secondary | ICD-10-CM | POA: Diagnosis not present

## 2011-10-10 DIAGNOSIS — N2581 Secondary hyperparathyroidism of renal origin: Secondary | ICD-10-CM | POA: Diagnosis not present

## 2011-10-10 DIAGNOSIS — D509 Iron deficiency anemia, unspecified: Secondary | ICD-10-CM | POA: Diagnosis not present

## 2011-10-10 DIAGNOSIS — E1129 Type 2 diabetes mellitus with other diabetic kidney complication: Secondary | ICD-10-CM | POA: Diagnosis not present

## 2011-10-12 DIAGNOSIS — N2581 Secondary hyperparathyroidism of renal origin: Secondary | ICD-10-CM | POA: Diagnosis not present

## 2011-10-12 DIAGNOSIS — D509 Iron deficiency anemia, unspecified: Secondary | ICD-10-CM | POA: Diagnosis not present

## 2011-10-12 DIAGNOSIS — N186 End stage renal disease: Secondary | ICD-10-CM | POA: Diagnosis not present

## 2011-10-12 DIAGNOSIS — E878 Other disorders of electrolyte and fluid balance, not elsewhere classified: Secondary | ICD-10-CM | POA: Diagnosis not present

## 2011-10-12 DIAGNOSIS — E1129 Type 2 diabetes mellitus with other diabetic kidney complication: Secondary | ICD-10-CM | POA: Diagnosis not present

## 2011-10-15 DIAGNOSIS — N186 End stage renal disease: Secondary | ICD-10-CM | POA: Diagnosis not present

## 2011-10-15 DIAGNOSIS — N2581 Secondary hyperparathyroidism of renal origin: Secondary | ICD-10-CM | POA: Diagnosis not present

## 2011-10-15 DIAGNOSIS — E1129 Type 2 diabetes mellitus with other diabetic kidney complication: Secondary | ICD-10-CM | POA: Diagnosis not present

## 2011-10-15 DIAGNOSIS — E878 Other disorders of electrolyte and fluid balance, not elsewhere classified: Secondary | ICD-10-CM | POA: Diagnosis not present

## 2011-10-15 DIAGNOSIS — D509 Iron deficiency anemia, unspecified: Secondary | ICD-10-CM | POA: Diagnosis not present

## 2011-10-17 DIAGNOSIS — D509 Iron deficiency anemia, unspecified: Secondary | ICD-10-CM | POA: Diagnosis not present

## 2011-10-17 DIAGNOSIS — N186 End stage renal disease: Secondary | ICD-10-CM | POA: Diagnosis not present

## 2011-10-17 DIAGNOSIS — E878 Other disorders of electrolyte and fluid balance, not elsewhere classified: Secondary | ICD-10-CM | POA: Diagnosis not present

## 2011-10-17 DIAGNOSIS — N2581 Secondary hyperparathyroidism of renal origin: Secondary | ICD-10-CM | POA: Diagnosis not present

## 2011-10-17 DIAGNOSIS — E1129 Type 2 diabetes mellitus with other diabetic kidney complication: Secondary | ICD-10-CM | POA: Diagnosis not present

## 2011-10-19 DIAGNOSIS — N186 End stage renal disease: Secondary | ICD-10-CM | POA: Diagnosis not present

## 2011-10-19 DIAGNOSIS — E1129 Type 2 diabetes mellitus with other diabetic kidney complication: Secondary | ICD-10-CM | POA: Diagnosis not present

## 2011-10-19 DIAGNOSIS — N2581 Secondary hyperparathyroidism of renal origin: Secondary | ICD-10-CM | POA: Diagnosis not present

## 2011-10-19 DIAGNOSIS — E878 Other disorders of electrolyte and fluid balance, not elsewhere classified: Secondary | ICD-10-CM | POA: Diagnosis not present

## 2011-10-19 DIAGNOSIS — D509 Iron deficiency anemia, unspecified: Secondary | ICD-10-CM | POA: Diagnosis not present

## 2011-10-21 DIAGNOSIS — N186 End stage renal disease: Secondary | ICD-10-CM | POA: Diagnosis not present

## 2011-10-22 DIAGNOSIS — N186 End stage renal disease: Secondary | ICD-10-CM | POA: Diagnosis not present

## 2011-10-22 DIAGNOSIS — N2581 Secondary hyperparathyroidism of renal origin: Secondary | ICD-10-CM | POA: Diagnosis not present

## 2011-10-22 DIAGNOSIS — D509 Iron deficiency anemia, unspecified: Secondary | ICD-10-CM | POA: Diagnosis not present

## 2011-11-14 DIAGNOSIS — E1129 Type 2 diabetes mellitus with other diabetic kidney complication: Secondary | ICD-10-CM | POA: Diagnosis not present

## 2011-11-21 DIAGNOSIS — N186 End stage renal disease: Secondary | ICD-10-CM | POA: Diagnosis not present

## 2011-11-23 DIAGNOSIS — D509 Iron deficiency anemia, unspecified: Secondary | ICD-10-CM | POA: Diagnosis not present

## 2011-11-23 DIAGNOSIS — N2581 Secondary hyperparathyroidism of renal origin: Secondary | ICD-10-CM | POA: Diagnosis not present

## 2011-11-23 DIAGNOSIS — N186 End stage renal disease: Secondary | ICD-10-CM | POA: Diagnosis not present

## 2011-12-22 DIAGNOSIS — N186 End stage renal disease: Secondary | ICD-10-CM | POA: Diagnosis not present

## 2011-12-24 DIAGNOSIS — D509 Iron deficiency anemia, unspecified: Secondary | ICD-10-CM | POA: Diagnosis not present

## 2011-12-24 DIAGNOSIS — N186 End stage renal disease: Secondary | ICD-10-CM | POA: Diagnosis not present

## 2011-12-24 DIAGNOSIS — Z23 Encounter for immunization: Secondary | ICD-10-CM | POA: Diagnosis not present

## 2011-12-24 DIAGNOSIS — N2581 Secondary hyperparathyroidism of renal origin: Secondary | ICD-10-CM | POA: Diagnosis not present

## 2012-01-03 ENCOUNTER — Ambulatory Visit
Admission: RE | Admit: 2012-01-03 | Discharge: 2012-01-03 | Disposition: A | Discharge status: Home / Self Care | Payer: Medicare Other | Source: Ambulatory Visit | Attending: Nephrology | Admitting: Nephrology

## 2012-01-03 DIAGNOSIS — Z1231 Encounter for screening mammogram for malignant neoplasm of breast: Secondary | ICD-10-CM

## 2012-01-21 DIAGNOSIS — N186 End stage renal disease: Secondary | ICD-10-CM | POA: Diagnosis not present

## 2012-01-23 DIAGNOSIS — N2581 Secondary hyperparathyroidism of renal origin: Secondary | ICD-10-CM | POA: Diagnosis not present

## 2012-01-23 DIAGNOSIS — N186 End stage renal disease: Secondary | ICD-10-CM | POA: Diagnosis not present

## 2012-01-23 DIAGNOSIS — D509 Iron deficiency anemia, unspecified: Secondary | ICD-10-CM | POA: Diagnosis not present

## 2012-01-23 DIAGNOSIS — D631 Anemia in chronic kidney disease: Secondary | ICD-10-CM | POA: Diagnosis not present

## 2012-02-13 DIAGNOSIS — E1129 Type 2 diabetes mellitus with other diabetic kidney complication: Secondary | ICD-10-CM | POA: Diagnosis not present

## 2012-02-21 DIAGNOSIS — N186 End stage renal disease: Secondary | ICD-10-CM | POA: Diagnosis not present

## 2012-02-22 DIAGNOSIS — N186 End stage renal disease: Secondary | ICD-10-CM | POA: Diagnosis not present

## 2012-02-22 DIAGNOSIS — D631 Anemia in chronic kidney disease: Secondary | ICD-10-CM | POA: Diagnosis not present

## 2012-02-22 DIAGNOSIS — D509 Iron deficiency anemia, unspecified: Secondary | ICD-10-CM | POA: Diagnosis not present

## 2012-02-22 DIAGNOSIS — N2581 Secondary hyperparathyroidism of renal origin: Secondary | ICD-10-CM | POA: Diagnosis not present

## 2012-02-25 DIAGNOSIS — D631 Anemia in chronic kidney disease: Secondary | ICD-10-CM | POA: Diagnosis not present

## 2012-02-25 DIAGNOSIS — N2581 Secondary hyperparathyroidism of renal origin: Secondary | ICD-10-CM | POA: Diagnosis not present

## 2012-02-25 DIAGNOSIS — D509 Iron deficiency anemia, unspecified: Secondary | ICD-10-CM | POA: Diagnosis not present

## 2012-02-25 DIAGNOSIS — N186 End stage renal disease: Secondary | ICD-10-CM | POA: Diagnosis not present

## 2012-02-25 DIAGNOSIS — N039 Chronic nephritic syndrome with unspecified morphologic changes: Secondary | ICD-10-CM | POA: Diagnosis not present

## 2012-02-27 DIAGNOSIS — N186 End stage renal disease: Secondary | ICD-10-CM | POA: Diagnosis not present

## 2012-02-27 DIAGNOSIS — D631 Anemia in chronic kidney disease: Secondary | ICD-10-CM | POA: Diagnosis not present

## 2012-02-27 DIAGNOSIS — D509 Iron deficiency anemia, unspecified: Secondary | ICD-10-CM | POA: Diagnosis not present

## 2012-02-27 DIAGNOSIS — N2581 Secondary hyperparathyroidism of renal origin: Secondary | ICD-10-CM | POA: Diagnosis not present

## 2012-02-29 DIAGNOSIS — D509 Iron deficiency anemia, unspecified: Secondary | ICD-10-CM | POA: Diagnosis not present

## 2012-02-29 DIAGNOSIS — N039 Chronic nephritic syndrome with unspecified morphologic changes: Secondary | ICD-10-CM | POA: Diagnosis not present

## 2012-02-29 DIAGNOSIS — N186 End stage renal disease: Secondary | ICD-10-CM | POA: Diagnosis not present

## 2012-02-29 DIAGNOSIS — N2581 Secondary hyperparathyroidism of renal origin: Secondary | ICD-10-CM | POA: Diagnosis not present

## 2012-03-03 DIAGNOSIS — N2581 Secondary hyperparathyroidism of renal origin: Secondary | ICD-10-CM | POA: Diagnosis not present

## 2012-03-03 DIAGNOSIS — D509 Iron deficiency anemia, unspecified: Secondary | ICD-10-CM | POA: Diagnosis not present

## 2012-03-03 DIAGNOSIS — N039 Chronic nephritic syndrome with unspecified morphologic changes: Secondary | ICD-10-CM | POA: Diagnosis not present

## 2012-03-03 DIAGNOSIS — N186 End stage renal disease: Secondary | ICD-10-CM | POA: Diagnosis not present

## 2012-03-05 DIAGNOSIS — N186 End stage renal disease: Secondary | ICD-10-CM | POA: Diagnosis not present

## 2012-03-05 DIAGNOSIS — N039 Chronic nephritic syndrome with unspecified morphologic changes: Secondary | ICD-10-CM | POA: Diagnosis not present

## 2012-03-05 DIAGNOSIS — D509 Iron deficiency anemia, unspecified: Secondary | ICD-10-CM | POA: Diagnosis not present

## 2012-03-05 DIAGNOSIS — N2581 Secondary hyperparathyroidism of renal origin: Secondary | ICD-10-CM | POA: Diagnosis not present

## 2012-03-07 DIAGNOSIS — N186 End stage renal disease: Secondary | ICD-10-CM | POA: Diagnosis not present

## 2012-03-07 DIAGNOSIS — D631 Anemia in chronic kidney disease: Secondary | ICD-10-CM | POA: Diagnosis not present

## 2012-03-07 DIAGNOSIS — D509 Iron deficiency anemia, unspecified: Secondary | ICD-10-CM | POA: Diagnosis not present

## 2012-03-07 DIAGNOSIS — N2581 Secondary hyperparathyroidism of renal origin: Secondary | ICD-10-CM | POA: Diagnosis not present

## 2012-03-10 DIAGNOSIS — D509 Iron deficiency anemia, unspecified: Secondary | ICD-10-CM | POA: Diagnosis not present

## 2012-03-10 DIAGNOSIS — N2581 Secondary hyperparathyroidism of renal origin: Secondary | ICD-10-CM | POA: Diagnosis not present

## 2012-03-10 DIAGNOSIS — D631 Anemia in chronic kidney disease: Secondary | ICD-10-CM | POA: Diagnosis not present

## 2012-03-10 DIAGNOSIS — N186 End stage renal disease: Secondary | ICD-10-CM | POA: Diagnosis not present

## 2012-03-12 DIAGNOSIS — D509 Iron deficiency anemia, unspecified: Secondary | ICD-10-CM | POA: Diagnosis not present

## 2012-03-12 DIAGNOSIS — N186 End stage renal disease: Secondary | ICD-10-CM | POA: Diagnosis not present

## 2012-03-12 DIAGNOSIS — N2581 Secondary hyperparathyroidism of renal origin: Secondary | ICD-10-CM | POA: Diagnosis not present

## 2012-03-12 DIAGNOSIS — D631 Anemia in chronic kidney disease: Secondary | ICD-10-CM | POA: Diagnosis not present

## 2012-03-14 DIAGNOSIS — D631 Anemia in chronic kidney disease: Secondary | ICD-10-CM | POA: Diagnosis not present

## 2012-03-14 DIAGNOSIS — N2581 Secondary hyperparathyroidism of renal origin: Secondary | ICD-10-CM | POA: Diagnosis not present

## 2012-03-14 DIAGNOSIS — N186 End stage renal disease: Secondary | ICD-10-CM | POA: Diagnosis not present

## 2012-03-14 DIAGNOSIS — D509 Iron deficiency anemia, unspecified: Secondary | ICD-10-CM | POA: Diagnosis not present

## 2012-03-17 DIAGNOSIS — N186 End stage renal disease: Secondary | ICD-10-CM | POA: Diagnosis not present

## 2012-03-17 DIAGNOSIS — D509 Iron deficiency anemia, unspecified: Secondary | ICD-10-CM | POA: Diagnosis not present

## 2012-03-17 DIAGNOSIS — N2581 Secondary hyperparathyroidism of renal origin: Secondary | ICD-10-CM | POA: Diagnosis not present

## 2012-03-17 DIAGNOSIS — D631 Anemia in chronic kidney disease: Secondary | ICD-10-CM | POA: Diagnosis not present

## 2012-03-19 DIAGNOSIS — D631 Anemia in chronic kidney disease: Secondary | ICD-10-CM | POA: Diagnosis not present

## 2012-03-19 DIAGNOSIS — D509 Iron deficiency anemia, unspecified: Secondary | ICD-10-CM | POA: Diagnosis not present

## 2012-03-19 DIAGNOSIS — N2581 Secondary hyperparathyroidism of renal origin: Secondary | ICD-10-CM | POA: Diagnosis not present

## 2012-03-19 DIAGNOSIS — N186 End stage renal disease: Secondary | ICD-10-CM | POA: Diagnosis not present

## 2012-03-21 DIAGNOSIS — N186 End stage renal disease: Secondary | ICD-10-CM | POA: Diagnosis not present

## 2012-03-21 DIAGNOSIS — N2581 Secondary hyperparathyroidism of renal origin: Secondary | ICD-10-CM | POA: Diagnosis not present

## 2012-03-21 DIAGNOSIS — D509 Iron deficiency anemia, unspecified: Secondary | ICD-10-CM | POA: Diagnosis not present

## 2012-03-21 DIAGNOSIS — D631 Anemia in chronic kidney disease: Secondary | ICD-10-CM | POA: Diagnosis not present

## 2012-03-22 DIAGNOSIS — N186 End stage renal disease: Secondary | ICD-10-CM | POA: Diagnosis not present

## 2012-03-24 DIAGNOSIS — D631 Anemia in chronic kidney disease: Secondary | ICD-10-CM | POA: Diagnosis not present

## 2012-03-24 DIAGNOSIS — N186 End stage renal disease: Secondary | ICD-10-CM | POA: Diagnosis not present

## 2012-03-24 DIAGNOSIS — E1159 Type 2 diabetes mellitus with other circulatory complications: Secondary | ICD-10-CM | POA: Diagnosis not present

## 2012-03-24 DIAGNOSIS — N2581 Secondary hyperparathyroidism of renal origin: Secondary | ICD-10-CM | POA: Diagnosis not present

## 2012-03-24 DIAGNOSIS — D509 Iron deficiency anemia, unspecified: Secondary | ICD-10-CM | POA: Diagnosis not present

## 2012-04-06 ENCOUNTER — Emergency Department (INDEPENDENT_AMBULATORY_CARE_PROVIDER_SITE_OTHER)
Admission: EM | Admit: 2012-04-06 | Discharge: 2012-04-06 | Disposition: A | Discharge status: Home / Self Care | Payer: Medicare Other | Source: Home / Self Care

## 2012-04-06 ENCOUNTER — Encounter (HOSPITAL_COMMUNITY): Payer: Self-pay | Admitting: *Deleted

## 2012-04-06 DIAGNOSIS — R197 Diarrhea, unspecified: Secondary | ICD-10-CM

## 2012-04-06 DIAGNOSIS — M722 Plantar fascial fibromatosis: Secondary | ICD-10-CM | POA: Diagnosis not present

## 2012-04-06 MED ORDER — ONDANSETRON HCL 4 MG PO TABS: 4.0000 mg | ORAL_TABLET | Freq: Four times a day (QID) | ORAL | Status: DC

## 2012-04-06 NOTE — ED Notes (Signed)
Patient complains of bilateral foot pain x 2 weeks. Also, patient complains of nausea and diarrhea x 2 weeks. Denies vomiting, fever and chills.

## 2012-04-06 NOTE — ED Provider Notes (Signed)
History     CSN: [id number]  Arrival date & time 04/06/12  1134   None     Chief Complaint  Patient presents with  . Foot Pain    (Consider location/radiation/quality/duration/timing/severity/associated sxs/prior treatment) HPI Comments: 51 year old female patient with end-stage renal disease presents with pain in her left heel. She states the pain in her left heel is been getting worse over the past couple weeks. It is worse with walking. There is no history of trauma. Pain is located on the plantar aspect of the heel toward the arch. She states it is very tender and almost impossible to put weight on. She has similar symptoms in the right foot but not nearly as much pain or discomfort as in the left.  Her second complaint is that of mild diarrhea for 4 weeks. This tends to occur only when she. That time she has an urgency to defecate with loose stools. She was unable to produce samples on the spot between meals. She denies abdominal cramps or pain. She has been taking some Pepto-Bismol with relief of symptoms. She does not recall taking antibiotics recently.   Past Medical History  Diagnosis Date  . ESRD (end stage renal disease)   . Type 2 diabetes mellitus   . Hypertension   . CVA (cerebral infarction)     right parietal 05/19/2000  . Vaginal bleeding   . Hyperparathyroidism     Past Surgical History  Procedure Date  . Parathyroidectomy with autotransplant 12/07/2010  . Knee surgery     right, x2  . Brachial artery graft     x2 for dialysis  . Av fistula placement     upper rt thigh    No family history on file.  History  Substance Use Topics  . Smoking status: Never Smoker   . Smokeless tobacco: Not on file  . Alcohol Use: Yes    OB History    Grav Para Term Preterm Abortions TAB SAB Ect Mult Living                  Review of Systems  Constitutional: Positive for activity change. Negative for fever and chills.  HENT: Negative.   Respiratory: Negative.    Cardiovascular: Negative.   Gastrointestinal: Positive for nausea and diarrhea. Negative for vomiting and abdominal distention.  Genitourinary: Negative.   Skin: Negative.   Neurological: Negative.     Allergies  Doxycycline and Peroxyl  Home Medications   Current Outpatient Rx  Name  Route  Sig  Dispense  Refill  . AMLODIPINE BESYLATE 10 MG PO TABS   Oral   Take 10 mg by mouth daily.           . ASPIRIN 81 MG PO TABS   Oral   Take 81 mg by mouth daily.           Marland Kitchen CALCIUM CARBONATE ANTACID 500 MG PO CHEW   Oral   Chew 1 tablet by mouth daily.           Marland Kitchen ESOMEPRAZOLE MAGNESIUM 40 MG PO CPDR   Oral   Take 40 mg by mouth daily before breakfast.           . ETHYL CHLORIDE EX AERO   Topical   Apply topically as needed.           Marland Kitchen GLIPIZIDE 10 MG PO TABS   Oral   Take 10 mg by mouth 2 (two) times daily before a meal.           .  ONDANSETRON HCL 4 MG PO TABS   Oral   Take 1 tablet (4 mg total) by mouth every 6 (six) hours.   12 tablet   0   . TRAMADOL HCL 50 MG PO TABS   Oral   Take 50 mg by mouth every 6 (six) hours as needed.           Marland Kitchen VITAMIN B-12 100 MCG PO TABS   Oral   Take 50 mcg by mouth daily.             BP 179/85  Pulse 108  Temp 98 F (36.7 C) (Oral)  Resp 16  SpO2 100%  LMP 03/07/2012  Physical Exam  Nursing note and vitals reviewed. Constitutional: She is oriented to person, place, and time. She appears well-developed and well-nourished. No distress.  Neck: Normal range of motion. Neck supple.  Cardiovascular: Normal rate, regular rhythm and normal heart sounds.   Pulmonary/Chest: Effort normal and breath sounds normal.  Musculoskeletal: Normal range of motion. She exhibits tenderness. She exhibits no edema.       Exquisite tenderness in the plantar aspect especially the heel of the left foot. She said tenderness difficult for me to examine it. There is no associated edema, swelling or deformity or discoloration. She  is able to plantarflex and dorsiflex the foot. She is not able to place much weight on the foot. The dorsal pedal pulse is 2+. Distal neurovascular sensory is grossly intact  Neurological: She is alert and oriented to person, place, and time. No cranial nerve deficit.  Skin: Skin is warm and dry. No rash noted. No erythema.  Psychiatric: She has a normal mood and affect.    ED Course  Procedures (including critical care time)  Labs Reviewed - No data to display No results found.   1. Plantar fasciitis of left foot   2. Diarrhea       MDM  She was instructed to take Imodium once or twice a day. Did not stop the diarrhea with it do to the chronicity we will have her followup with a PCP in the adult clinic here. Prior to leaving she will obtain an appointment. The importance of getting this appointment was stressed to her that she will need to have further workup of her complaints today Options for treatment of the plantar fasciitis include rolling her foot over a cold for frozen can several times during the day, wear fully and soles with a high arch support and probable referral to the podiatrist. She was also offered the option to have injection of Marcaine and steroids in the heel. She was given quite some time to think about the injection in any and she decided against it.        Hayden Rasmussen, NP 04/06/12 1356

## 2012-04-06 NOTE — ED Provider Notes (Signed)
Medical screening examination/treatment/procedure(s) were performed by non-physician practitioner and as supervising physician I was immediately available for consultation/collaboration.  Raynald Blend, MD 04/06/12 1430

## 2012-04-21 ENCOUNTER — Encounter (HOSPITAL_COMMUNITY): Payer: Self-pay | Admitting: Cardiology

## 2012-04-21 ENCOUNTER — Emergency Department (HOSPITAL_COMMUNITY)
Admission: EM | Admit: 2012-04-21 | Discharge: 2012-04-21 | Disposition: A | Discharge status: Home / Self Care | Payer: Medicare Other | Attending: Emergency Medicine | Admitting: Emergency Medicine

## 2012-04-21 ENCOUNTER — Emergency Department (HOSPITAL_COMMUNITY): Payer: Medicare Other

## 2012-04-21 DIAGNOSIS — Z8673 Personal history of transient ischemic attack (TIA), and cerebral infarction without residual deficits: Secondary | ICD-10-CM | POA: Insufficient documentation

## 2012-04-21 DIAGNOSIS — J90 Pleural effusion, not elsewhere classified: Secondary | ICD-10-CM | POA: Diagnosis not present

## 2012-04-21 DIAGNOSIS — L02619 Cutaneous abscess of unspecified foot: Secondary | ICD-10-CM | POA: Diagnosis not present

## 2012-04-21 DIAGNOSIS — B351 Tinea unguium: Secondary | ICD-10-CM | POA: Insufficient documentation

## 2012-04-21 DIAGNOSIS — I120 Hypertensive chronic kidney disease with stage 5 chronic kidney disease or end stage renal disease: Secondary | ICD-10-CM | POA: Diagnosis not present

## 2012-04-21 DIAGNOSIS — E213 Hyperparathyroidism, unspecified: Secondary | ICD-10-CM | POA: Diagnosis not present

## 2012-04-21 DIAGNOSIS — L03116 Cellulitis of left lower limb: Secondary | ICD-10-CM

## 2012-04-21 DIAGNOSIS — M7989 Other specified soft tissue disorders: Secondary | ICD-10-CM | POA: Diagnosis not present

## 2012-04-21 DIAGNOSIS — E119 Type 2 diabetes mellitus without complications: Secondary | ICD-10-CM | POA: Insufficient documentation

## 2012-04-21 DIAGNOSIS — N186 End stage renal disease: Secondary | ICD-10-CM | POA: Diagnosis not present

## 2012-04-21 DIAGNOSIS — Z79899 Other long term (current) drug therapy: Secondary | ICD-10-CM | POA: Insufficient documentation

## 2012-04-21 DIAGNOSIS — M79609 Pain in unspecified limb: Secondary | ICD-10-CM | POA: Diagnosis not present

## 2012-04-21 DIAGNOSIS — Z7982 Long term (current) use of aspirin: Secondary | ICD-10-CM | POA: Diagnosis not present

## 2012-04-21 LAB — CBC WITH DIFFERENTIAL/PLATELET
Basophils Absolute: 0.1 K/uL (ref 0.0–0.1)
Basophils Relative: 1 % (ref 0–1)
Eosinophils Absolute: 0.3 K/uL (ref 0.0–0.7)
Eosinophils Relative: 3 % (ref 0–5)
HCT: 27.3 % — ABNORMAL LOW (ref 36.0–46.0)
Hemoglobin: 8.7 g/dL — ABNORMAL LOW (ref 12.0–15.0)
Lymphocytes Relative: 13 % (ref 12–46)
Lymphs Abs: 1.4 K/uL (ref 0.7–4.0)
MCH: 29.7 pg (ref 26.0–34.0)
MCHC: 31.9 g/dL (ref 30.0–36.0)
MCV: 93.2 fL (ref 78.0–100.0)
Monocytes Absolute: 0.8 K/uL (ref 0.1–1.0)
Monocytes Relative: 7 % (ref 3–12)
Neutro Abs: 8.6 K/uL — ABNORMAL HIGH (ref 1.7–7.7)
Neutrophils Relative %: 77 % (ref 43–77)
Platelets: 283 K/uL (ref 150–400)
RBC: 2.93 MIL/uL — ABNORMAL LOW (ref 3.87–5.11)
RDW: 14.2 % (ref 11.5–15.5)
WBC: 11.1 K/uL — ABNORMAL HIGH (ref 4.0–10.5)

## 2012-04-21 LAB — BASIC METABOLIC PANEL
CO2: 25 mEq/L (ref 19–32)
Calcium: 11.1 mg/dL — ABNORMAL HIGH (ref 8.4–10.5)
Glucose, Bld: 155 mg/dL — ABNORMAL HIGH (ref 70–99)
Potassium: 4.2 mEq/L (ref 3.5–5.1)
Sodium: 132 mEq/L — ABNORMAL LOW (ref 135–145)

## 2012-04-21 MED ORDER — CEFTRIAXONE SODIUM 1 G IJ SOLR
1.0000 g | Freq: Once | INTRAMUSCULAR | Status: AC
  Administered 2012-04-21: 1 g via INTRAMUSCULAR
  Filled 2012-04-21: qty 10

## 2012-04-21 MED ORDER — DEXTROSE 5 % IV SOLN: 1.0000 g | Freq: Once | INTRAVENOUS | Status: DC

## 2012-04-21 MED ORDER — SULFAMETHOXAZOLE-TMP DS 800-160 MG PO TABS
1.0000 | ORAL_TABLET | Freq: Once | ORAL | Status: AC
  Administered 2012-04-21: 1 via ORAL
  Filled 2012-04-21: qty 1

## 2012-04-21 MED ORDER — LIDOCAINE HCL (PF) 1 % IJ SOLN
INTRAMUSCULAR | Status: AC
  Administered 2012-04-21: 2.1 mL
  Filled 2012-04-21: qty 5

## 2012-04-21 MED ORDER — CEPHALEXIN 500 MG PO CAPS: 1000.0000 mg | ORAL_CAPSULE | Freq: Two times a day (BID) | ORAL | Status: DC

## 2012-04-21 MED ORDER — HYDROCODONE-ACETAMINOPHEN 5-325 MG PO TABS
1.0000 | ORAL_TABLET | Freq: Once | ORAL | Status: AC
  Administered 2012-04-21: 1 via ORAL
  Filled 2012-04-21: qty 1

## 2012-04-21 MED ORDER — SULFAMETHOXAZOLE-TRIMETHOPRIM 800-160 MG PO TABS: 1.0000 | ORAL_TABLET | Freq: Two times a day (BID) | ORAL | Status: DC

## 2012-04-21 NOTE — ED Notes (Signed)
Pt to XRAY

## 2012-04-21 NOTE — ED Notes (Signed)
AIDET performed. 

## 2012-04-21 NOTE — ED Notes (Signed)
Pt reports increased swelling to bilateral feet. Reports she was seen at [facility] on 12/15. Reports her right big toe has some discoloration and swelling. Pt reports an odor to foot also. Also reports a cough over the past couple of days.

## 2012-04-21 NOTE — ED Notes (Signed)
Discharge instructions and paperwork given to pt; pt alert and oriented x4; pt verbalized understanding of discharge and followup care;

## 2012-04-22 DIAGNOSIS — N186 End stage renal disease: Secondary | ICD-10-CM | POA: Diagnosis not present

## 2012-04-22 NOTE — ED Provider Notes (Signed)
Medical screening examination/treatment/procedure(s) were performed by non-physician practitioner and as supervising physician I was immediately available for consultation/collaboration.   Gwyneth Sprout, MD 04/22/12 [phone]

## 2012-04-22 NOTE — ED Provider Notes (Signed)
History     CSN: [id number]  Arrival date & time 04/21/12  1253   First MD Initiated Contact with Patient 04/21/12 1803      Chief Complaint  Patient presents with  . Leg Swelling    (Consider location/radiation/quality/duration/timing/severity/associated sxs/prior treatment) HPI Comments: [name] presents with worsened left foot pain which started out as pain at her inferior heel,  And has progressed to include her entire left foot and is associated with redness and swelling.  She denies injury to her foot.  Pain is worse with weight bearing and is currently using a walker for assistance with this.  She was seen for this problem on 12/15 at which time she was diagnosed with plantar fasciitis.  She has not had f/u care for this yet,  But was offered steriod injections which she deferred. Additionally,  She reports chronic right great toenail thickening with it becoming looser and more sore over the past month.  She washes it daily with peroxide, then applied antibiotic ointment which does not relieve the symptoms.   Past medical history is significant for diabetes,  esrd on dialysis.  She has not discussed her foot problems with her pcp.  The history is provided by the patient.    Past Medical History  Diagnosis Date  . ESRD (end stage renal disease)   . Type 2 diabetes mellitus   . Hypertension   . CVA (cerebral infarction)     right parietal 05/19/2000  . Vaginal bleeding   . Hyperparathyroidism     Past Surgical History  Procedure Date  . Parathyroidectomy with autotransplant 12/07/2010  . Knee surgery     right, x2  . Brachial artery graft     x2 for dialysis  . Av fistula placement     upper rt thigh    No family history on file.  History  Substance Use Topics  . Smoking status: Never Smoker   . Smokeless tobacco: Not on file  . Alcohol Use: Yes    OB History    Grav Para Term Preterm Abortions TAB SAB Ect Mult Living                  Review of  Systems  Musculoskeletal: Positive for joint swelling and gait problem.  Skin: Positive for color change and wound.  Neurological: Negative for weakness and numbness.    Allergies  Doxycycline and Peroxyl  Home Medications   Current Outpatient Rx  Name  Route  Sig  Dispense  Refill  . AMLODIPINE BESYLATE 10 MG PO TABS   Oral   Take 10 mg by mouth daily.           . ASPIRIN 81 MG PO TABS   Oral   Take 81 mg by mouth daily.           Marland Kitchen CALCIUM CARBONATE ANTACID 500 MG PO CHEW   Oral   Chew 2 tablets by mouth 3 (three) times daily with meals.          Marland Kitchen ESOMEPRAZOLE MAGNESIUM 40 MG PO CPDR   Oral   Take 40 mg by mouth daily as needed. For heartburn         . GLIPIZIDE 10 MG PO TABS   Oral   Take 10 mg by mouth 2 (two) times daily before a meal.          . HYDROCODONE-ACETAMINOPHEN 5-325 MG PO TABS   Oral   Take 1 tablet by mouth  every 6 (six) hours as needed. For pain         . LIDOCAINE EX   Apply externally   Apply topically daily as needed. Use 1 hour prior to dialysis as needed for pain on Mon, Wed and Fri         . RENA-VITE PO TABS   Oral   Take 1 tablet by mouth daily.         Marland Kitchen VITAMIN B-12 100 MCG PO TABS   Oral   Take 100 mcg by mouth daily.          . CEPHALEXIN 500 MG PO CAPS   Oral   Take 2 capsules (1,000 mg total) by mouth 2 (two) times daily.   40 capsule   0   . SULFAMETHOXAZOLE-TRIMETHOPRIM 800-160 MG PO TABS   Oral   Take 1 tablet by mouth every 12 (twelve) hours.   10 tablet   0     BP 177/81  Pulse 104  Temp 99.2 F (37.3 C) (Oral)  Resp 17  SpO2 98%  LMP 04/06/2012  Physical Exam  Constitutional: She appears well-developed and well-nourished.       hypertensive  HENT:  Head: Atraumatic.  Neck: Normal range of motion.  Cardiovascular:       Pulses equal bilaterally  Musculoskeletal: She exhibits edema and tenderness.  Neurological: She is alert. She has normal strength. She displays normal reflexes. No  sensory deficit.       Equal strength  Skin: Skin is warm and dry. There is erythema.       Erythema,  Warm to touch skin on left distal dorsal foot to midfoot.  No red streaking,  No pitting edema. Bilateral dorsal pedis pulses intact and easily palpated.  She does have a nontender flat hardened callus planter over 4th to 5th mtps with deep fissure,  No drainage or fluctuance.  Right foot is significant with for thick, dark, scaly great toenail with surrounding thickened, dark brawny nail folds.  No drainage from either foot.    Psychiatric: She has a normal mood and affect.    ED Course  Procedures (including critical care time)  Labs Reviewed  CBC WITH DIFFERENTIAL - Abnormal; Notable for the following:    WBC 11.1 (*)     RBC 2.93 (*)     Hemoglobin 8.7 (*)     HCT 27.3 (*)     Neutro Abs 8.6 (*)     All other components within normal limits  BASIC METABOLIC PANEL - Abnormal; Notable for the following:    Sodium 132 (*)     Chloride 92 (*)     Glucose, Bld 155 (*)     Creatinine, Ser 8.40 (*)     Calcium 11.1 (*)     GFR calc non Af Amer 5 (*)     GFR calc Af Amer 6 (*)     All other components within normal limits  LAB REPORT - SCANNED   Dg Chest 2 View  04/21/2012  *RADIOLOGY REPORT*  Clinical Data: Hemoptysis.  Diabetes.  Lower extremity swelling.  CHEST - 2 VIEW  Comparison: 10/01/2010.  11/30/2010.  Findings: The heart is at the upper limits of normal in size. There is calcification of the thoracic aorta.  The left lung is clear.  There is volume loss in the right lower lobe with a small amount of adjacent pleural density.  Findings are most consistent with right lower lobe pneumonia.  Given  this history, embolic disease is not excluded.  No evidence of heart failure.  No significant bony finding.  IMPRESSION: Recurrence of abnormal density, volume loss and pleural fluid in the right lower lobe, similar to the picture of June 2012.  This could be due to pneumonia.  Possibility  of embolic disease does exist given the clinical history.   Original Report Authenticated By: Paulina Fusi, M.D.    Dg Foot Complete Left  04/21/2012  *RADIOLOGY REPORT*  Clinical Data: 51 year old female with left foot swelling x2 days. Heel pain.  LEFT FOOT - COMPLETE 3+ VIEW  Comparison: None.  Findings: Diffuse calcified atherosclerosis about the left foot and ankle. Bone mineralization is within normal limits.  Calcaneus intact.  Joint spaces within normal limits.  No fracture or dislocation identified.  No focal osteolysis identified.  IMPRESSION: 1. No acute osseous abnormality identified about the left foot. 2.  Severe calcified peripheral vascular disease.   Original Report Authenticated By: Odessa Fleming III, M.D.      1. Cellulitis of left foot   2. Onychomycosis of toenail    Pt was given rocephin 1 gram IM,  Bactrim Ds tablet prior to dc home.   MDM  xrays reviewed with no evidence of osteomyelitis,  Trauma, fracture.  Pt prescribed keflex and bactrim.  She does have dialysis in am - encouraged to have her MD recheck her feet tomorrow at this appt.    Also encouraged to discuss with pcp tx of onychomycosis and referral to podiatrist.  Pt states she used to see a podiatrist,  But she got scared when they removed some calluses,  Stating she "didn't think this should be done".  Encouraged pt that she needs a podiatrist to help maintain foot health with her DM.  Pt understands.   The patient appears reasonably screened and/or stabilized for discharge and I doubt any other medical condition or other [facility] requiring further screening, evaluation, or treatment in the ED at this time prior to discharge.        Burgess Amor, PA 04/22/12 1357

## 2012-04-25 DIAGNOSIS — N2581 Secondary hyperparathyroidism of renal origin: Secondary | ICD-10-CM | POA: Diagnosis not present

## 2012-04-25 DIAGNOSIS — N186 End stage renal disease: Secondary | ICD-10-CM | POA: Diagnosis not present

## 2012-04-25 DIAGNOSIS — Z992 Dependence on renal dialysis: Secondary | ICD-10-CM | POA: Diagnosis not present

## 2012-04-25 DIAGNOSIS — D509 Iron deficiency anemia, unspecified: Secondary | ICD-10-CM | POA: Diagnosis not present

## 2012-04-25 DIAGNOSIS — N039 Chronic nephritic syndrome with unspecified morphologic changes: Secondary | ICD-10-CM | POA: Diagnosis not present

## 2012-04-28 DIAGNOSIS — D509 Iron deficiency anemia, unspecified: Secondary | ICD-10-CM | POA: Diagnosis not present

## 2012-04-28 DIAGNOSIS — Z992 Dependence on renal dialysis: Secondary | ICD-10-CM | POA: Diagnosis not present

## 2012-04-28 DIAGNOSIS — N2581 Secondary hyperparathyroidism of renal origin: Secondary | ICD-10-CM | POA: Diagnosis not present

## 2012-04-28 DIAGNOSIS — N186 End stage renal disease: Secondary | ICD-10-CM | POA: Diagnosis not present

## 2012-04-28 DIAGNOSIS — N039 Chronic nephritic syndrome with unspecified morphologic changes: Secondary | ICD-10-CM | POA: Diagnosis not present

## 2012-04-30 DIAGNOSIS — D631 Anemia in chronic kidney disease: Secondary | ICD-10-CM | POA: Diagnosis not present

## 2012-04-30 DIAGNOSIS — N186 End stage renal disease: Secondary | ICD-10-CM | POA: Diagnosis not present

## 2012-04-30 DIAGNOSIS — N039 Chronic nephritic syndrome with unspecified morphologic changes: Secondary | ICD-10-CM | POA: Diagnosis not present

## 2012-04-30 DIAGNOSIS — Z992 Dependence on renal dialysis: Secondary | ICD-10-CM | POA: Diagnosis not present

## 2012-04-30 DIAGNOSIS — N2581 Secondary hyperparathyroidism of renal origin: Secondary | ICD-10-CM | POA: Diagnosis not present

## 2012-04-30 DIAGNOSIS — D509 Iron deficiency anemia, unspecified: Secondary | ICD-10-CM | POA: Diagnosis not present

## 2012-05-02 DIAGNOSIS — N186 End stage renal disease: Secondary | ICD-10-CM | POA: Diagnosis not present

## 2012-05-02 DIAGNOSIS — Z992 Dependence on renal dialysis: Secondary | ICD-10-CM | POA: Diagnosis not present

## 2012-05-02 DIAGNOSIS — D631 Anemia in chronic kidney disease: Secondary | ICD-10-CM | POA: Diagnosis not present

## 2012-05-02 DIAGNOSIS — D509 Iron deficiency anemia, unspecified: Secondary | ICD-10-CM | POA: Diagnosis not present

## 2012-05-02 DIAGNOSIS — N2581 Secondary hyperparathyroidism of renal origin: Secondary | ICD-10-CM | POA: Diagnosis not present

## 2012-05-05 DIAGNOSIS — Z992 Dependence on renal dialysis: Secondary | ICD-10-CM | POA: Diagnosis not present

## 2012-05-05 DIAGNOSIS — N039 Chronic nephritic syndrome with unspecified morphologic changes: Secondary | ICD-10-CM | POA: Diagnosis not present

## 2012-05-05 DIAGNOSIS — N2581 Secondary hyperparathyroidism of renal origin: Secondary | ICD-10-CM | POA: Diagnosis not present

## 2012-05-05 DIAGNOSIS — N186 End stage renal disease: Secondary | ICD-10-CM | POA: Diagnosis not present

## 2012-05-05 DIAGNOSIS — D509 Iron deficiency anemia, unspecified: Secondary | ICD-10-CM | POA: Diagnosis not present

## 2012-05-06 ENCOUNTER — Other Ambulatory Visit: Payer: Self-pay | Admitting: Podiatry

## 2012-05-06 DIAGNOSIS — I739 Peripheral vascular disease, unspecified: Secondary | ICD-10-CM | POA: Diagnosis not present

## 2012-05-06 DIAGNOSIS — E119 Type 2 diabetes mellitus without complications: Secondary | ICD-10-CM | POA: Diagnosis not present

## 2012-05-06 DIAGNOSIS — R609 Edema, unspecified: Secondary | ICD-10-CM

## 2012-05-06 DIAGNOSIS — S91109A Unspecified open wound of unspecified toe(s) without damage to nail, initial encounter: Secondary | ICD-10-CM | POA: Diagnosis not present

## 2012-05-06 DIAGNOSIS — L97509 Non-pressure chronic ulcer of other part of unspecified foot with unspecified severity: Secondary | ICD-10-CM | POA: Diagnosis not present

## 2012-05-06 DIAGNOSIS — R52 Pain, unspecified: Secondary | ICD-10-CM

## 2012-05-07 ENCOUNTER — Other Ambulatory Visit: Payer: Medicare Other

## 2012-05-07 ENCOUNTER — Other Ambulatory Visit (HOSPITAL_COMMUNITY): Payer: Self-pay | Admitting: Podiatry

## 2012-05-07 DIAGNOSIS — Z992 Dependence on renal dialysis: Secondary | ICD-10-CM | POA: Diagnosis not present

## 2012-05-07 DIAGNOSIS — D509 Iron deficiency anemia, unspecified: Secondary | ICD-10-CM | POA: Diagnosis not present

## 2012-05-07 DIAGNOSIS — I739 Peripheral vascular disease, unspecified: Secondary | ICD-10-CM

## 2012-05-07 DIAGNOSIS — N186 End stage renal disease: Secondary | ICD-10-CM | POA: Diagnosis not present

## 2012-05-07 DIAGNOSIS — IMO0002 Reserved for concepts with insufficient information to code with codable children: Secondary | ICD-10-CM

## 2012-05-07 DIAGNOSIS — N2581 Secondary hyperparathyroidism of renal origin: Secondary | ICD-10-CM | POA: Diagnosis not present

## 2012-05-07 DIAGNOSIS — N039 Chronic nephritic syndrome with unspecified morphologic changes: Secondary | ICD-10-CM | POA: Diagnosis not present

## 2012-05-07 DIAGNOSIS — M79669 Pain in unspecified lower leg: Secondary | ICD-10-CM

## 2012-05-08 ENCOUNTER — Encounter (HOSPITAL_COMMUNITY): Payer: Medicare Other

## 2012-05-09 DIAGNOSIS — D631 Anemia in chronic kidney disease: Secondary | ICD-10-CM | POA: Diagnosis not present

## 2012-05-09 DIAGNOSIS — N2581 Secondary hyperparathyroidism of renal origin: Secondary | ICD-10-CM | POA: Diagnosis not present

## 2012-05-09 DIAGNOSIS — N186 End stage renal disease: Secondary | ICD-10-CM | POA: Diagnosis not present

## 2012-05-09 DIAGNOSIS — D509 Iron deficiency anemia, unspecified: Secondary | ICD-10-CM | POA: Diagnosis not present

## 2012-05-09 DIAGNOSIS — Z992 Dependence on renal dialysis: Secondary | ICD-10-CM | POA: Diagnosis not present

## 2012-05-12 ENCOUNTER — Ambulatory Visit (HOSPITAL_COMMUNITY)
Admission: RE | Admit: 2012-05-12 | Discharge: 2012-05-12 | Disposition: A | Discharge status: Home / Self Care | Payer: Medicare Other | Source: Ambulatory Visit | Attending: Cardiovascular Disease | Admitting: Cardiovascular Disease

## 2012-05-12 DIAGNOSIS — D631 Anemia in chronic kidney disease: Secondary | ICD-10-CM | POA: Diagnosis not present

## 2012-05-12 DIAGNOSIS — L98499 Non-pressure chronic ulcer of skin of other sites with unspecified severity: Secondary | ICD-10-CM | POA: Diagnosis not present

## 2012-05-12 DIAGNOSIS — L97509 Non-pressure chronic ulcer of other part of unspecified foot with unspecified severity: Secondary | ICD-10-CM | POA: Diagnosis not present

## 2012-05-12 DIAGNOSIS — Z992 Dependence on renal dialysis: Secondary | ICD-10-CM | POA: Diagnosis not present

## 2012-05-12 DIAGNOSIS — I739 Peripheral vascular disease, unspecified: Secondary | ICD-10-CM

## 2012-05-12 DIAGNOSIS — D509 Iron deficiency anemia, unspecified: Secondary | ICD-10-CM | POA: Diagnosis not present

## 2012-05-12 DIAGNOSIS — IMO0002 Reserved for concepts with insufficient information to code with codable children: Secondary | ICD-10-CM

## 2012-05-12 DIAGNOSIS — M79669 Pain in unspecified lower leg: Secondary | ICD-10-CM

## 2012-05-12 DIAGNOSIS — N2581 Secondary hyperparathyroidism of renal origin: Secondary | ICD-10-CM | POA: Diagnosis not present

## 2012-05-12 DIAGNOSIS — M79609 Pain in unspecified limb: Secondary | ICD-10-CM | POA: Diagnosis not present

## 2012-05-12 DIAGNOSIS — N186 End stage renal disease: Secondary | ICD-10-CM | POA: Diagnosis not present

## 2012-05-12 DIAGNOSIS — I70219 Atherosclerosis of native arteries of extremities with intermittent claudication, unspecified extremity: Secondary | ICD-10-CM | POA: Diagnosis not present

## 2012-05-12 NOTE — Progress Notes (Signed)
LEA duplex completed. [name] D  

## 2012-05-14 DIAGNOSIS — Z992 Dependence on renal dialysis: Secondary | ICD-10-CM | POA: Diagnosis not present

## 2012-05-14 DIAGNOSIS — D631 Anemia in chronic kidney disease: Secondary | ICD-10-CM | POA: Diagnosis not present

## 2012-05-14 DIAGNOSIS — N186 End stage renal disease: Secondary | ICD-10-CM | POA: Diagnosis not present

## 2012-05-14 DIAGNOSIS — N2581 Secondary hyperparathyroidism of renal origin: Secondary | ICD-10-CM | POA: Diagnosis not present

## 2012-05-14 DIAGNOSIS — D509 Iron deficiency anemia, unspecified: Secondary | ICD-10-CM | POA: Diagnosis not present

## 2012-05-16 DIAGNOSIS — N186 End stage renal disease: Secondary | ICD-10-CM | POA: Diagnosis not present

## 2012-05-16 DIAGNOSIS — Z992 Dependence on renal dialysis: Secondary | ICD-10-CM | POA: Diagnosis not present

## 2012-05-16 DIAGNOSIS — N2581 Secondary hyperparathyroidism of renal origin: Secondary | ICD-10-CM | POA: Diagnosis not present

## 2012-05-16 DIAGNOSIS — D509 Iron deficiency anemia, unspecified: Secondary | ICD-10-CM | POA: Diagnosis not present

## 2012-05-16 DIAGNOSIS — D631 Anemia in chronic kidney disease: Secondary | ICD-10-CM | POA: Diagnosis not present

## 2012-05-19 DIAGNOSIS — Z992 Dependence on renal dialysis: Secondary | ICD-10-CM | POA: Diagnosis not present

## 2012-05-19 DIAGNOSIS — D631 Anemia in chronic kidney disease: Secondary | ICD-10-CM | POA: Diagnosis not present

## 2012-05-19 DIAGNOSIS — N186 End stage renal disease: Secondary | ICD-10-CM | POA: Diagnosis not present

## 2012-05-19 DIAGNOSIS — N2581 Secondary hyperparathyroidism of renal origin: Secondary | ICD-10-CM | POA: Diagnosis not present

## 2012-05-19 DIAGNOSIS — D509 Iron deficiency anemia, unspecified: Secondary | ICD-10-CM | POA: Diagnosis not present

## 2012-05-21 ENCOUNTER — Ambulatory Visit (INDEPENDENT_AMBULATORY_CARE_PROVIDER_SITE_OTHER): Payer: Medicaid Other | Admitting: Vascular Surgery

## 2012-05-21 ENCOUNTER — Encounter: Payer: Self-pay | Admitting: Vascular Surgery

## 2012-05-21 ENCOUNTER — Encounter (INDEPENDENT_AMBULATORY_CARE_PROVIDER_SITE_OTHER): Payer: Medicare Other | Admitting: *Deleted

## 2012-05-21 ENCOUNTER — Other Ambulatory Visit: Payer: Self-pay

## 2012-05-21 VITALS — BP 196/79 | HR 110 | Temp 98.4°F

## 2012-05-21 DIAGNOSIS — I739 Peripheral vascular disease, unspecified: Secondary | ICD-10-CM

## 2012-05-21 DIAGNOSIS — E1129 Type 2 diabetes mellitus with other diabetic kidney complication: Secondary | ICD-10-CM | POA: Diagnosis not present

## 2012-05-21 DIAGNOSIS — N2581 Secondary hyperparathyroidism of renal origin: Secondary | ICD-10-CM | POA: Diagnosis not present

## 2012-05-21 DIAGNOSIS — Z992 Dependence on renal dialysis: Secondary | ICD-10-CM | POA: Diagnosis not present

## 2012-05-21 DIAGNOSIS — I70269 Atherosclerosis of native arteries of extremities with gangrene, unspecified extremity: Secondary | ICD-10-CM | POA: Diagnosis not present

## 2012-05-21 DIAGNOSIS — I96 Gangrene, not elsewhere classified: Secondary | ICD-10-CM | POA: Diagnosis not present

## 2012-05-21 DIAGNOSIS — D509 Iron deficiency anemia, unspecified: Secondary | ICD-10-CM | POA: Diagnosis not present

## 2012-05-21 DIAGNOSIS — D631 Anemia in chronic kidney disease: Secondary | ICD-10-CM | POA: Diagnosis not present

## 2012-05-21 DIAGNOSIS — N186 End stage renal disease: Secondary | ICD-10-CM | POA: Diagnosis not present

## 2012-05-21 NOTE — Assessment & Plan Note (Signed)
Based on her exam and Doppler studies this patient has evidence of severe infrainguinal arterial occlusive disease. She has dry gangrene of the left great toe. Clearly this is a limb threatening situation. I think the only chance for limb salvage would be to proceed with arteriography to see what options she might have for revascularization. She dialyzes on Monday Wednesdays and Fridays and will arrange for an arteriogram on Tuesday or Thursday.I have reviewed with the patient the indications for arteriography. In addition, I have reviewed the potential complications of arteriography including but not limited to: Bleeding, arterial injury, arterial thrombosis, dye action, renal insufficiency, or other unpredictable medical problems. I have explained to the patient that if we find disease amenable to angioplasty we could potentially address this at the same time. I have discussed the potential complications of angioplasty and stenting, including but not limited to: Bleeding, arterial thrombosis, arterial injury, dissection, or the need for surgical intervention. If she does require revascularization she would likely require preoperative vein map his I suspect that she will not have adequate vein for autogenous bypass which might affect the decision about whether or not surgery is indicated. However clearly without revascularization she will likely require a BKA R. AKA. We will make further recommendations pending the results of her arteriogram.

## 2012-05-21 NOTE — Progress Notes (Signed)
[facility] [place]  Patient name: [name] MRN: [id number] DOB: 07/26/1960 Sex: female  REASON FOR CONSULT: gangrene of the left foot with severe infrainguinal arterial occlusive disease. Referred by Dr. Cynthia Dunham.  HPI: [name] is a 52 y.o. female who is a very poor historian. The best I can tell, she developed pain in her left foot approximately 2 weeks ago which has been fairly persistent. I believe she went for dialysis today and was noted to have gangrene of the left foot and was sent for vascular consultation. She states that she is ambulatory with a walker. I suspect that her activity is very limited. She denies any history of fever or chills. She has not noted any drainage from her left foot.  She dialyzes on Monday Wednesdays and Fridays and has a functioning right thigh graft.  Past Medical History  Diagnosis Date  . ESRD (end stage renal disease)   . Type 2 diabetes mellitus   . Hypertension   . CVA (cerebral infarction)     right parietal 05/19/2000  . Vaginal bleeding   . Hyperparathyroidism     History reviewed. No pertinent family history.  SOCIAL HISTORY: History  Substance Use Topics  . Smoking status: Never Smoker   . Smokeless tobacco: Not on file  . Alcohol Use: Yes     Comment: occasional    Allergies  Allergen Reactions  . Doxycycline Other (See Comments)    Unknown reaction  . Peroxyl (Hydrogen Peroxide) Hives and Rash    Current Outpatient Prescriptions  Medication Sig Dispense Refill  . amLODipine (NORVASC) 10 MG tablet Take 10 mg by mouth daily.        . aspirin 81 MG tablet Take 81 mg by mouth daily.        . calcium carbonate (TUMS - DOSED IN MG ELEMENTAL CALCIUM) 500 MG chewable tablet Chew 2 tablets by mouth 3 (three) times daily with meals.       . cephALEXin (KEFLEX) 500 MG capsule Take 2 capsules (1,000 mg total) by mouth 2 (two) times daily.  40 capsule  0  . esomeprazole (NEXIUM) 40 MG capsule Take  40 mg by mouth daily as needed. For heartburn      . glipiZIDE (GLUCOTROL) 10 MG tablet Take 10 mg by mouth 2 (two) times daily before a meal.       . HYDROcodone-acetaminophen (NORCO/VICODIN) 5-325 MG per tablet Take 1 tablet by mouth every 6 (six) hours as needed. For pain      . LIDOCAINE EX Apply topically daily as needed. Use 1 hour prior to dialysis as needed for pain on Mon, Wed and Fri      . multivitamin (RENA-VIT) TABS tablet Take 1 tablet by mouth daily.      . sulfamethoxazole-trimethoprim (SEPTRA DS) 800-160 MG per tablet Take 1 tablet by mouth every 12 (twelve) hours.  10 tablet  0  . vitamin B-12 (CYANOCOBALAMIN) 100 MCG tablet Take 100 mcg by mouth daily.         REVIEW OF SYSTEMS: [X ] denotes positive finding; [  ] denotes negative finding  CARDIOVASCULAR:  [ ] chest pain   [ ] chest pressure   [ ] palpitations   [ ] orthopnea   [X ] dyspnea on exertion   [ ] claudication   [ ] rest pain   [ ] DVT   [ ] phlebitis PULMONARY:   [ ] productive cough   [ ]   asthma   [ ] wheezing NEUROLOGIC:   [ ] weakness  [ ] paresthesias  [ ] aphasia  [ ] amaurosis  [ ] dizziness HEMATOLOGIC:   [ ] bleeding problems   [ ] clotting disorders MUSCULOSKELETAL:  [ ] joint pain   [ ] joint swelling [ ] leg swelling GASTROINTESTINAL: [ ]  blood in stool  [ ]  hematemesis GENITOURINARY:  [ ]  dysuria  [ ]  hematuria PSYCHIATRIC:  [ ] history of major depression INTEGUMENTARY:  [ ] rashes  [ ] ulcers CONSTITUTIONAL:  [ ] fever   [ ] chills  PHYSICAL EXAM: Filed Vitals:   05/21/12 1419  BP: 196/79  Pulse: 110  Temp: 98.4 F (36.9 C)  TempSrc: Oral  SpO2: 100%   There is no height or weight on file to calculate BMI. GENERAL: The patient is a well-nourished female, in no acute distress. The vital signs are documented above. CARDIOVASCULAR: There is a regular rate and rhythm. I do not detect carotid bruits. She has a functioning right thigh AV graft which is somewhat pulsatile. I cannot palpate a  popliteal or pedal pulses on the right. On the left side, which is the symptomatic side, she has a slightly diminished femoral pulse. I cannot palpate a popliteal or pedal pulse. She has no significant lower extremity swelling. PULMONARY: There is good air exchange bilaterally without wheezing or rales. ABDOMEN: Soft and non-tender with normal pitched bowel sounds.  MUSCULOSKELETAL: There are no major deformities. NEUROLOGIC: No focal weakness or paresthesias are detected. SKIN: she has dry gangrene of the left great toe with some blistering on the dorsum of the foot and some discoloration of the foot. Her is no erythema or drainage. PSYCHIATRIC: The patient has a normal affect.  DATA:  I have independently interpreted her arterial Doppler study today. She has monophasic signals in the dorsalis pedis and posterior tibial positions bilaterally. The vessels were not compressible as they were severely calcified. The toe brachial index on the right was 0.84 suggesting adequate circulation.  We were able to find a duplex scan that was done at [facility] [place]. On the left side, there was diffuse disease throughout the left superficial femoral artery and popliteal artery. The posterior tibial and peroneal arteries appeared to be occluded.  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 She has dry gangrene of the left great toe. Clearly this is a limb threatening situation. I think the only chance for limb salvage would be to proceed with arteriography to see what options she might have for revascularization. She dialyzes on Monday Wednesdays and Fridays and will arrange for an arteriogram on Tuesday or Thursday.I have reviewed with the patient the indications for arteriography. In addition, I have reviewed the potential  complications of arteriography including but not limited to: Bleeding, arterial injury, arterial thrombosis, dye action, renal insufficiency, or other unpredictable medical problems. I have explained to the patient that if we find disease amenable to angioplasty we could potentially address this at the same time. I have discussed the potential complications of angioplasty and stenting, including but not limited to: Bleeding, arterial thrombosis, arterial injury, dissection, or the need for surgical intervention. If she does require revascularization she would likely require preoperative vein map his I suspect that she will not have adequate vein for autogenous bypass which   might affect the decision about whether or not surgery is indicated. However clearly without revascularization she will likely require a BKA R. AKA. We will make further recommendations pending the results of her arteriogram.   Shey Yott S [facility] [place] Beeper: [phone]    

## 2012-05-22 ENCOUNTER — Other Ambulatory Visit: Payer: Self-pay

## 2012-05-23 DIAGNOSIS — D509 Iron deficiency anemia, unspecified: Secondary | ICD-10-CM | POA: Diagnosis not present

## 2012-05-23 DIAGNOSIS — N039 Chronic nephritic syndrome with unspecified morphologic changes: Secondary | ICD-10-CM | POA: Diagnosis not present

## 2012-05-23 DIAGNOSIS — Z992 Dependence on renal dialysis: Secondary | ICD-10-CM | POA: Diagnosis not present

## 2012-05-23 DIAGNOSIS — N186 End stage renal disease: Secondary | ICD-10-CM | POA: Diagnosis not present

## 2012-05-23 DIAGNOSIS — N2581 Secondary hyperparathyroidism of renal origin: Secondary | ICD-10-CM | POA: Diagnosis not present

## 2012-05-26 ENCOUNTER — Encounter (HOSPITAL_COMMUNITY): Payer: Self-pay | Admitting: Pharmacy Technician

## 2012-05-26 DIAGNOSIS — N186 End stage renal disease: Secondary | ICD-10-CM | POA: Diagnosis not present

## 2012-05-26 DIAGNOSIS — N2581 Secondary hyperparathyroidism of renal origin: Secondary | ICD-10-CM | POA: Diagnosis not present

## 2012-05-26 DIAGNOSIS — D631 Anemia in chronic kidney disease: Secondary | ICD-10-CM | POA: Diagnosis not present

## 2012-05-26 MED ORDER — SODIUM CHLORIDE 0.9 % IV SOLN: INTRAVENOUS | Status: DC

## 2012-05-27 ENCOUNTER — Telehealth: Payer: Self-pay | Admitting: Vascular Surgery

## 2012-05-27 ENCOUNTER — Other Ambulatory Visit: Payer: Self-pay | Admitting: *Deleted

## 2012-05-27 ENCOUNTER — Ambulatory Visit (HOSPITAL_COMMUNITY)
Admission: RE | Admit: 2012-05-27 | Discharge: 2012-05-28 | Disposition: A | Discharge status: Home / Self Care | Payer: Medicare Other | Source: Ambulatory Visit | Attending: Surgery | Admitting: Surgery

## 2012-05-27 ENCOUNTER — Encounter (HOSPITAL_COMMUNITY)
Admission: RE | Disposition: A | Discharge status: Home / Self Care | Payer: Self-pay | Source: Ambulatory Visit | Attending: Surgery

## 2012-05-27 DIAGNOSIS — I739 Peripheral vascular disease, unspecified: Secondary | ICD-10-CM

## 2012-05-27 DIAGNOSIS — Z8673 Personal history of transient ischemic attack (TIA), and cerebral infarction without residual deficits: Secondary | ICD-10-CM | POA: Diagnosis not present

## 2012-05-27 DIAGNOSIS — E119 Type 2 diabetes mellitus without complications: Secondary | ICD-10-CM | POA: Insufficient documentation

## 2012-05-27 DIAGNOSIS — N2581 Secondary hyperparathyroidism of renal origin: Secondary | ICD-10-CM | POA: Insufficient documentation

## 2012-05-27 DIAGNOSIS — Z48812 Encounter for surgical aftercare following surgery on the circulatory system: Secondary | ICD-10-CM

## 2012-05-27 DIAGNOSIS — I120 Hypertensive chronic kidney disease with stage 5 chronic kidney disease or end stage renal disease: Secondary | ICD-10-CM | POA: Insufficient documentation

## 2012-05-27 DIAGNOSIS — N186 End stage renal disease: Secondary | ICD-10-CM | POA: Insufficient documentation

## 2012-05-27 DIAGNOSIS — I70269 Atherosclerosis of native arteries of extremities with gangrene, unspecified extremity: Secondary | ICD-10-CM

## 2012-05-27 DIAGNOSIS — L98499 Non-pressure chronic ulcer of skin of other sites with unspecified severity: Secondary | ICD-10-CM | POA: Insufficient documentation

## 2012-05-27 HISTORY — PX: ABDOMINAL AORTAGRAM: SHX5454

## 2012-05-27 HISTORY — PX: LOWER EXTREMITY ANGIOGRAM: SHX5508

## 2012-05-27 LAB — POCT I-STAT, CHEM 8
BUN: 21 mg/dL (ref 6–23)
Creatinine, Ser: 7.8 mg/dL — ABNORMAL HIGH (ref 0.50–1.10)
Hemoglobin: 9.2 g/dL — ABNORMAL LOW (ref 12.0–15.0)
Potassium: 3.9 mEq/L (ref 3.5–5.1)
Sodium: 136 mEq/L (ref 135–145)
TCO2: 28 mmol/L (ref 0–100)

## 2012-05-27 LAB — HCG, SERUM, QUALITATIVE: Preg, Serum: NEGATIVE

## 2012-05-27 SURGERY — ABDOMINAL AORTAGRAM
Anesthesia: LOCAL

## 2012-05-27 MED ORDER — ONDANSETRON HCL 4 MG/2ML IJ SOLN: 4.0000 mg | Freq: Four times a day (QID) | INTRAMUSCULAR | Status: DC | PRN

## 2012-05-27 MED ORDER — HYDROCODONE-ACETAMINOPHEN 5-325 MG PO TABS: 1.0000 | ORAL_TABLET | Freq: Four times a day (QID) | ORAL | Status: DC | PRN

## 2012-05-27 MED ORDER — AMLODIPINE BESYLATE 10 MG PO TABS
10.0000 mg | ORAL_TABLET | Freq: Every day | ORAL | Status: DC
  Administered 2012-05-27: 10 mg via ORAL
  Filled 2012-05-27 (×2): qty 1

## 2012-05-27 MED ORDER — PHENOL 1.4 % MT LIQD
1.0000 | OROMUCOSAL | Status: DC | PRN
  Filled 2012-05-27: qty 177

## 2012-05-27 MED ORDER — ASPIRIN EC 81 MG PO TBEC
81.0000 mg | DELAYED_RELEASE_TABLET | Freq: Every day | ORAL | Status: DC
  Filled 2012-05-27 (×2): qty 1

## 2012-05-27 MED ORDER — PANTOPRAZOLE SODIUM 40 MG PO TBEC
40.0000 mg | DELAYED_RELEASE_TABLET | Freq: Every day | ORAL | Status: DC
  Filled 2012-05-27: qty 1

## 2012-05-27 MED ORDER — FENTANYL CITRATE 0.05 MG/ML IJ SOLN
INTRAMUSCULAR | Status: AC
  Filled 2012-05-27: qty 2

## 2012-05-27 MED ORDER — HEPARIN (PORCINE) IN NACL 2-0.9 UNIT/ML-% IJ SOLN
INTRAMUSCULAR | Status: AC
  Filled 2012-05-27: qty 1000

## 2012-05-27 MED ORDER — INSULIN ASPART 100 UNIT/ML ~~LOC~~ SOLN: 6.0000 [IU] | Freq: Three times a day (TID) | SUBCUTANEOUS | Status: DC

## 2012-05-27 MED ORDER — ACETAMINOPHEN 325 MG PO TABS: 650.0000 mg | ORAL_TABLET | Freq: Four times a day (QID) | ORAL | Status: DC | PRN

## 2012-05-27 MED ORDER — METOPROLOL TARTRATE 1 MG/ML IV SOLN
2.0000 mg | INTRAVENOUS | Status: DC | PRN
  Filled 2012-05-27: qty 5

## 2012-05-27 MED ORDER — HYDRALAZINE HCL 20 MG/ML IJ SOLN
10.0000 mg | INTRAMUSCULAR | Status: DC | PRN
  Filled 2012-05-27: qty 0.5

## 2012-05-27 MED ORDER — CALCIUM CARBONATE ANTACID 500 MG PO CHEW
2.0000 | CHEWABLE_TABLET | Freq: Three times a day (TID) | ORAL | Status: DC
Start: 2012-05-28 — End: 2012-05-28
  Filled 2012-05-27 (×4): qty 2

## 2012-05-27 MED ORDER — LABETALOL HCL 5 MG/ML IV SOLN
INTRAVENOUS | Status: AC
  Filled 2012-05-27: qty 4

## 2012-05-27 MED ORDER — LABETALOL HCL 5 MG/ML IV SOLN
10.0000 mg | INTRAVENOUS | Status: DC | PRN
  Filled 2012-05-27: qty 4

## 2012-05-27 MED ORDER — ACETAMINOPHEN 325 MG PO TABS: 325.0000 mg | ORAL_TABLET | ORAL | Status: DC | PRN

## 2012-05-27 MED ORDER — HYDRALAZINE HCL 20 MG/ML IJ SOLN
INTRAMUSCULAR | Status: AC
  Filled 2012-05-27: qty 1

## 2012-05-27 MED ORDER — GUAIFENESIN-DM 100-10 MG/5ML PO SYRP
15.0000 mL | ORAL_SOLUTION | ORAL | Status: DC | PRN
  Filled 2012-05-27: qty 15

## 2012-05-27 MED ORDER — RENA-VITE PO TABS
1.0000 | ORAL_TABLET | Freq: Every day | ORAL | Status: DC
  Administered 2012-05-27: 1 via ORAL
  Filled 2012-05-27 (×2): qty 1

## 2012-05-27 MED ORDER — ALUM & MAG HYDROXIDE-SIMETH 200-200-20 MG/5ML PO SUSP
15.0000 mL | ORAL | Status: DC | PRN
  Filled 2012-05-27: qty 30

## 2012-05-27 MED ORDER — DIAZEPAM 2 MG PO TABS: 2.0000 mg | ORAL_TABLET | Freq: Four times a day (QID) | ORAL | Status: DC | PRN

## 2012-05-27 MED ORDER — INSULIN ASPART 100 UNIT/ML ~~LOC~~ SOLN: 0.0000 [IU] | Freq: Three times a day (TID) | SUBCUTANEOUS | Status: DC

## 2012-05-27 MED ORDER — HEPARIN SODIUM (PORCINE) 1000 UNIT/ML IJ SOLN
INTRAMUSCULAR | Status: AC
  Filled 2012-05-27: qty 1

## 2012-05-27 MED ORDER — LIDOCAINE HCL (PF) 1 % IJ SOLN
INTRAMUSCULAR | Status: AC
  Filled 2012-05-27: qty 30

## 2012-05-27 MED ORDER — ACETAMINOPHEN 650 MG RE SUPP: 325.0000 mg | RECTAL | Status: DC | PRN

## 2012-05-27 NOTE — H&P (View-Only) (Signed)
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 Referred by Dr. Camille Bal.  HPI: [name] is a 52 y.o. female who is a very poor historian. The best I can tell, she developed pain in her left foot approximately 2 weeks ago which has been fairly persistent. I believe she went for dialysis today and was noted to have gangrene of the left foot and was sent for vascular consultation. She states that she is ambulatory with a walker. I suspect that her activity is very limited. She denies any history of fever or chills. She has not noted any drainage from her left foot.  She dialyzes on Monday Wednesdays and Fridays and has a functioning right thigh graft.  Past Medical History  Diagnosis Date  . ESRD (end stage renal disease)   . Type 2 diabetes mellitus   . Hypertension   . CVA (cerebral infarction)     right parietal 05/19/2000  . Vaginal bleeding   . Hyperparathyroidism     History reviewed. No pertinent family history.  SOCIAL HISTORY: History  Substance Use Topics  . Smoking status: Never Smoker   . Smokeless tobacco: Not on file  . Alcohol Use: Yes     Comment: occasional    Allergies  Allergen Reactions  . Doxycycline Other (See Comments)    Unknown reaction  . Peroxyl (Hydrogen Peroxide) Hives and Rash    Current Outpatient Prescriptions  Medication Sig Dispense Refill  . amLODipine (NORVASC) 10 MG tablet Take 10 mg by mouth daily.        Marland Kitchen aspirin 81 MG tablet Take 81 mg by mouth daily.        . calcium carbonate (TUMS - DOSED IN MG ELEMENTAL CALCIUM) 500 MG chewable tablet Chew 2 tablets by mouth 3 (three) times daily with meals.       . cephALEXin (KEFLEX) 500 MG capsule Take 2 capsules (1,000 mg total) by mouth 2 (two) times daily.  40 capsule  0  . esomeprazole (NEXIUM) 40 MG capsule Take  40 mg by mouth daily as needed. For heartburn      . glipiZIDE (GLUCOTROL) 10 MG tablet Take 10 mg by mouth 2 (two) times daily before a meal.       . HYDROcodone-acetaminophen (NORCO/VICODIN) 5-325 MG per tablet Take 1 tablet by mouth every 6 (six) hours as needed. For pain      . LIDOCAINE EX Apply topically daily as needed. Use 1 hour prior to dialysis as needed for pain on Mon, Wed and Fri      . multivitamin (RENA-VIT) TABS tablet Take 1 tablet by mouth daily.      Marland Kitchen sulfamethoxazole-trimethoprim (SEPTRA DS) 800-160 MG per tablet Take 1 tablet by mouth every 12 (twelve) hours.  10 tablet  0  . vitamin B-12 (CYANOCOBALAMIN) 100 MCG tablet Take 100 mcg by mouth daily.         REVIEW OF SYSTEMS: Arly.Keller ] denotes positive finding; [  ] denotes negative finding  CARDIOVASCULAR:  [ ]  chest pain   [ ]  chest pressure   [ ]  palpitations   [ ]  orthopnea   Arly.Keller ] dyspnea on exertion   [ ]  claudication   [ ]  rest pain   [ ]  DVT   [ ]  phlebitis PULMONARY:   [ ]  productive cough   [ ]   asthma   [ ]  wheezing NEUROLOGIC:   [ ]  weakness  [ ]  paresthesias  [ ]  aphasia  [ ]  amaurosis  [ ]  dizziness HEMATOLOGIC:   [ ]  bleeding problems   [ ]  clotting disorders MUSCULOSKELETAL:  [ ]  joint pain   [ ]  joint swelling [ ]  leg swelling GASTROINTESTINAL: [ ]   blood in stool  [ ]   hematemesis GENITOURINARY:  [ ]   dysuria  [ ]   hematuria PSYCHIATRIC:  [ ]  history of major depression INTEGUMENTARY:  [ ]  rashes  [ ]  ulcers CONSTITUTIONAL:  [ ]  fever   [ ]  chills  PHYSICAL EXAM: Filed Vitals:   05/21/12 1419  BP: 196/79  Pulse: 110  Temp: 98.4 F (36.9 C)  TempSrc: Oral  SpO2: 100%   There is no height or weight on file to calculate BMI. GENERAL: The patient is a well-nourished female, in no acute distress. The vital signs are documented above. CARDIOVASCULAR: There is a regular rate and rhythm. I do not detect carotid bruits. She has a functioning right thigh AV graft which is somewhat pulsatile. I cannot palpate a  popliteal or pedal pulses on the right. On the left side, which is the symptomatic side, she has a slightly diminished femoral pulse. I cannot palpate a popliteal or pedal pulse. She has no significant lower extremity swelling. PULMONARY: There is good air exchange bilaterally without wheezing or rales. ABDOMEN: Soft and non-tender with normal pitched bowel sounds.  MUSCULOSKELETAL: There are no major deformities. NEUROLOGIC: No focal weakness or paresthesias are detected. SKIN: she has dry gangrene of the left great toe with some blistering on the dorsum of the foot and some discoloration of the foot. Her is no erythema or drainage. PSYCHIATRIC: The patient has a normal affect.  DATA:  I have independently interpreted her arterial Doppler study today. She has monophasic signals in the dorsalis pedis and posterior tibial positions bilaterally. The vessels were not compressible as they were severely calcified. The toe brachial index on the right was 0.84 suggesting adequate circulation.  We were able to find a duplex scan that was done at [place] [facility]. On the left side, there was diffuse disease throughout the left superficial femoral artery and popliteal artery. The posterior tibial and peroneal arteries appeared to be occluded.  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 She has dry gangrene of the left great toe. Clearly this is a limb threatening situation. I think the only chance for limb salvage would be to proceed with arteriography to see what options she might have for revascularization. She dialyzes on Monday Wednesdays and Fridays and will arrange for an arteriogram on Tuesday or Thursday.I have reviewed with the patient the indications for arteriography. In addition, I have reviewed the potential  complications of arteriography including but not limited to: Bleeding, arterial injury, arterial thrombosis, dye action, renal insufficiency, or other unpredictable medical problems. I have explained to the patient that if we find disease amenable to angioplasty we could potentially address this at the same time. I have discussed the potential complications of angioplasty and stenting, including but not limited to: Bleeding, arterial thrombosis, arterial injury, dissection, or the need for surgical intervention. If she does require revascularization she would likely require preoperative vein map his I suspect that she will not have adequate vein for autogenous bypass which  might affect the decision about whether or not surgery is indicated. However clearly without revascularization she will likely require a BKA R. AKA. We will make further recommendations pending the results of her arteriogram.   DICKSON,CHRISTOPHER S V[facility] and Vein Specialists of [facility] Beeper: [phone]

## 2012-05-27 NOTE — Progress Notes (Signed)
Noted that the patient has black areas on left foot including entire big toe with black continuing up the foot - area is dry and smells of gangrene.  No open areas noted.  Right foot with some dark spots but none open.  Pulses audible with the doppler.  Dry dark area on left heel as well and several dry scaley areas over bil feet.

## 2012-05-27 NOTE — Interval H&P Note (Signed)
History and Physical Interval Note:  05/27/2012 8:43 AM  [name]  has presented today for surgery, with the diagnosis of PVD  The various methods of treatment have been discussed with the patient and family. After consideration of risks, benefits and other options for treatment, the patient has consented to  Procedure(s) (LRB) with comments: ABDOMINAL AORTAGRAM (N/A) as a surgical intervention .  The patient's history has been reviewed, patient examined, no change in status, stable for surgery.  I have reviewed the patient's chart and labs.  Questions were answered to the patient's satisfaction.     [name] IV, V. WELLS

## 2012-05-27 NOTE — Interval H&P Note (Signed)
History and Physical Interval Note:  05/27/2012 9:30 AM  [name]  has presented today for surgery, with the diagnosis of PVD  The various methods of treatment have been discussed with the patient and family. After consideration of risks, benefits and other options for treatment, the patient has consented to  Procedure(s) (LRB) with comments: ABDOMINAL AORTAGRAM (N/A) as a surgical intervention .  The patient's history has been reviewed, patient examined, no change in status, stable for surgery.  I have reviewed the patient's chart and labs.  Questions were answered to the patient's satisfaction.     BRABHAM IV, V. WELLS

## 2012-05-27 NOTE — Progress Notes (Signed)
Unable to start IV; IV therapy called

## 2012-05-27 NOTE — Telephone Encounter (Addendum)
Message copied by Shari Prows on Tue May 27, 2012  4:26 PM ------      Message from: Sharee Pimple      Created: Tue May 27, 2012  2:31 PM      Regarding: schedule                   ----- Message -----         From: Melene Plan, RN         Sent: 05/27/2012   2:15 PM           To: Sharee Pimple, CMA, [facility]                        ----- Message -----         From: Nada Libman, MD         Sent: 05/27/2012  12:44 PM           To: Melene Plan, RN            05/27/2012:            The patient will need ABIs when she returns to see Dr. Edilia Bo  I scheduled an appt for the above pt on 06/18/12 at 1pm. I mailed an appt letter and attempted to call the pt but there was no answering machine at [facility]awt

## 2012-05-27 NOTE — Progress Notes (Signed)
IV therapist unable to begin IV, 2nd IV Therapist called

## 2012-05-27 NOTE — Op Note (Signed)
Vascular and Vein Specialists of [facility]  Patient name: [name] MRN: [id number] DOB: [birth year] Sex: female  05/27/2012 Pre-operative Diagnosis: Left lower extremity ulcer Post-operative diagnosis:  Same Surgeon:  Jorge Ny Procedure Performed:  1.  ultrasound-guided access, right thigh A-V GG  2.  abdominal aortogram  3.  third order catheterization  4.  left lower extremity runoff  5.  angioplasty left popliteal artery   Indications:  This is a 52 year old female with a left toe ulcer. Preprocedure ultrasound revealed decreased circulation to the left leg. She therefore comes in for arteriogram and possible intervention  Procedure:  The patient was identified in the holding area and taken to room 8.  The patient was then placed supine on the table and prepped and draped in the usual sterile fashion.  A time out was called.  I initially contemplated access in the common femoral artery just proximal to the arterial anastomosis of her dialysis graft. However, the patient had difficulty lying flat. She had trouble with her breathing as well as decreased oxygen saturation. Therefore, I felt it was best to proceed by access of her dialysis graft. Under ultrasound guidance the arterial limb of the right thigh graft was cannulated using a micropuncture needle, under ultrasound guidance. A 018 wire was advanced without resistance, followed by placement of a micropuncture sheath. Next over a 035 wire, a 5 French sheath was placed. An omni-flush catheter was advanced into the abdominal aorta and positioned at the level of L1. An abdominal aortogram was then performed. Next, using the Omni flush catheter and a Benson wire, the cath was placed into the left external iliac artery, and left lower extremity runoff was performed. Findings:   Aortogram:  No significant stenosis is identified within the suprarenal abdominal aorta. An atretic renal arteries are identified bilaterally without  stenosis. The infrarenal abdominal aorta is widely patent. Bilateral common and external iliac arteries are widely patent.  Left Lower Extremity:  The left common femoral artery is widely patent. The left profunda femoral artery is widely patent. The left superficial femoral is widely patent. There is a focal area of approximately 50% stenosis just proximal to the adductor canal. There is a high-grade stenosis, approximately 95% within the popliteal artery just proximal to the joint space. Two-vessel runoff is visualized through the posterior tibial and anterior tibial artery, however these are very under opacified do to proximal disease.  Intervention:  After the above images were obtained, the decision was made to proceed with intervention. Over an 035 wire, a 6 French 55 cm Ansel 1 sheath was inserted into the left external iliac artery. The patient was fully heparinized. Next a 035 Woolley wire was advanced without resistance across the focal stenosis within the popliteal artery. I elected to perform primary balloon angioplasty. I selected a 4 x 40 balloon. The balloon was taken to nominal pressure and held up for 90 seconds. Followup arteriogram revealed significantly improved results, however there was a small focal dissection which was not flow limiting. I elected to reinflate the balloon and this area. The balloon was taken to 8 atmospheres and held up for 2 minutes. A followup study revealed again significantly improved blood flow and opacification of the popliteal artery. The stenosis was down to less than 10%. There was a small non-flow-limiting dissection still seen. Significantly improved opacification of the anterior tibial and posterior tibial artery were visualized. After the above results, the decision was made to terminate the procedure. Catheters and wires were  removed. I closed the sheath over a piece of tubing using a 3-0 nylon. There were no complications.  Impression:  #1  no aortoiliac  stenoses were identified  #2  approximately a 50% lesion within the left superficial femoral artery. This was felt to not be hemodynamically significant.  #3  high-grade, 95% stenosis within the left popliteal artery just proximal to the joint space. This was successfully treated using a 4 x 40 balloon.  #4  2 vessel runoff via the posterior tibial and anterior tibial artery. The anterior tibial is the dominant vessel across the ankle. The plantar arch does not fill completely.   Juleen China, M.D. [facility] Specialists of [place] Office: [phone] Pager:  [phone]

## 2012-05-28 DIAGNOSIS — E119 Type 2 diabetes mellitus without complications: Secondary | ICD-10-CM | POA: Diagnosis not present

## 2012-05-28 DIAGNOSIS — Z8673 Personal history of transient ischemic attack (TIA), and cerebral infarction without residual deficits: Secondary | ICD-10-CM | POA: Diagnosis not present

## 2012-05-28 DIAGNOSIS — N186 End stage renal disease: Secondary | ICD-10-CM | POA: Diagnosis not present

## 2012-05-28 DIAGNOSIS — I120 Hypertensive chronic kidney disease with stage 5 chronic kidney disease or end stage renal disease: Secondary | ICD-10-CM | POA: Diagnosis not present

## 2012-05-28 DIAGNOSIS — N2581 Secondary hyperparathyroidism of renal origin: Secondary | ICD-10-CM | POA: Diagnosis not present

## 2012-05-28 DIAGNOSIS — L98499 Non-pressure chronic ulcer of skin of other sites with unspecified severity: Secondary | ICD-10-CM | POA: Diagnosis not present

## 2012-05-28 LAB — GLUCOSE, CAPILLARY

## 2012-05-28 NOTE — Clinical Social Work Note (Signed)
Patient medically stable for discharge home, however she needs to go to her outpatient dialysis center [facility] before going home. Arrangements have been made for her to go to HD later than her normal schedule. Her regular transportation provider cannot transport her today off schedule. CSW facilitated transport to HD via ambulance.  Genelle Bal, MSW, LCSW [phone]

## 2012-05-28 NOTE — Discharge Summary (Signed)
Vascular and Vein Specialists Discharge Summary  [name] June 03, 1960 52 y.o. female  [id number]  Admission Date: 05/27/2012  Discharge Date: 05/28/12  Physician: Nada Libman, MD  Admission Diagnosis: PVD   HPI:   This is a 52 y.o. female is a very poor historian. The best I can tell, she developed pain in her left foot approximately 2 weeks ago which has been fairly persistent. I believe she went for dialysis today and was noted to have gangrene of the left foot and was sent for vascular consultation. She states that she is ambulatory with a walker. I suspect that her activity is very limited. She denies any history of fever or chills. She has not noted any drainage from her left foot.  She dialyzes on Monday Wednesdays and Fridays and has a functioning right thigh graft.  Hospital Course:  The patient was admitted to the hospital and taken to the operating room on 05/27/2012 and underwent  1. ultrasound-guided access, right thigh A-V GG  2. abdominal aortogram  3. third order catheterization  4. left lower extremity runoff  5. angioplasty left popliteal artery    The pt tolerated the procedure well and was transported to the PACU in good condition. She will be discharged today and will go to HD.  The remainder of the hospital course consisted of increasing mobilization and increasing intake of solids without difficulty.  CBC    Component Value Date/Time   WBC 11.1* 04/21/2012 1520   RBC 2.93* 04/21/2012 1520   HGB 9.2* 05/27/2012 0749   HCT 27.0* 05/27/2012 0749   PLT 283 04/21/2012 1520   MCV 93.2 04/21/2012 1520   MCH 29.7 04/21/2012 1520   MCHC 31.9 04/21/2012 1520   RDW 14.2 04/21/2012 1520   LYMPHSABS 1.4 04/21/2012 1520   MONOABS 0.8 04/21/2012 1520   EOSABS 0.3 04/21/2012 1520   BASOSABS 0.1 04/21/2012 1520    BMET    Component Value Date/Time   NA 136 05/27/2012 0749   K 3.9 05/27/2012 0749   CL 99 05/27/2012 0749   CO2 25 04/21/2012 1520   GLUCOSE 161*  05/27/2012 0749   BUN 21 05/27/2012 0749   CREATININE 7.80* 05/27/2012 0749   CALCIUM 11.1* 04/21/2012 1520   GFRNONAA 5* 04/21/2012 1520   GFRAA 6* 04/21/2012 1520     Discharge Instructions:   The patient is discharged to home with extensive instructions on wound care and progressive ambulation.  They are instructed not to drive or perform any heavy lifting until returning to see the physician in his office.  Discharge Orders    Future Appointments: Provider: Department: Dept Phone: Center:   06/02/2012  2:00 PM [facility] [place] [facility] [address] [phone] None   06/18/2012 1:00 PM [facility] -[facility] [phone] [facility]   06/18/2012 1:45 PM Chuck Hint, MD [facility] -[facility] [phone] [facility]      Discharge Diagnosis:  PVD  Secondary Diagnosis: Patient Active Problem List  Diagnosis  . Hyperparathyroidism, secondary  . Atherosclerosis of native arteries of the extremities with gangrene   Past Medical History  Diagnosis Date  . ESRD (end stage renal disease)   . Type 2 diabetes mellitus   . Hypertension   . CVA (cerebral infarction)     right parietal 05/19/2000  . Vaginal bleeding   . Hyperparathyroidism       [name], [name]  Home Medication Instructions [id number]   Printed on:05/28/12 [phone]  Medication Information                    aspirin 81 MG tablet Take 81 mg by mouth daily.             calcium carbonate (TUMS - DOSED IN MG ELEMENTAL CALCIUM) 500 MG chewable tablet Chew 2 tablets by mouth 3 (three) times daily with meals.            glipiZIDE (GLUCOTROL) 10 MG tablet Take 10 mg by mouth 2 (two) times daily before a meal.            esomeprazole (NEXIUM) 40 MG capsule Take 40 mg by mouth daily as needed. For heartburn           amLODipine (NORVASC) 10 MG tablet Take 10 mg by mouth daily.             multivitamin (RENA-VIT) TABS tablet Take 1 tablet by  mouth daily.           HYDROcodone-acetaminophen (NORCO/VICODIN) 5-325 MG per tablet Take 1 tablet by mouth every 6 (six) hours as needed. For pain           LIDOCAINE EX Apply 1 application topically See admin instructions. Use 1 hour prior to dialysis as needed for pain on Mon, Wed and Fri.           diazepam (VALIUM) 2 MG tablet Take 2 mg by mouth every 6 (six) hours as needed. For muscle spasms           Acetaminophen (TYLENOL PO) Take 1-2 tablets by mouth See admin instructions. Uses at dialysis Mon, Wed, Fridays each week for muscle spasms             Disposition: home  Patient's condition: is Good  Follow up: 1. Dr. Edilia Bo in 2 weeks   Doreatha Massed, PA-C [facility] [phone] 05/28/2012  8:36 AM

## 2012-05-28 NOTE — Progress Notes (Signed)
[facility]in Specialists Progress Note  05/28/2012 8:17 AM POD 1  Subjective:  Breathing better this am.  States she feels like her left foot is warmer.  VSS 87% RA  Filed Vitals:   05/28/12 0454  BP: 149/65  Pulse: 103  Temp: 99.8 F (37.7 C)  Resp: 18    Physical Exam: Incisions:  Bandage at right thigh graft is c/d/i Extremities:  Left foot is warm  CBC    Component Value Date/Time   WBC 11.1* 04/21/2012 1520   RBC 2.93* 04/21/2012 1520   HGB 9.2* 05/27/2012 0749   HCT 27.0* 05/27/2012 0749   PLT 283 04/21/2012 1520   MCV 93.2 04/21/2012 1520   MCH 29.7 04/21/2012 1520   MCHC 31.9 04/21/2012 1520   RDW 14.2 04/21/2012 1520   LYMPHSABS 1.4 04/21/2012 1520   MONOABS 0.8 04/21/2012 1520   EOSABS 0.3 04/21/2012 1520   BASOSABS 0.1 04/21/2012 1520    BMET    Component Value Date/Time   NA 136 05/27/2012 0749   K 3.9 05/27/2012 0749   CL 99 05/27/2012 0749   CO2 25 04/21/2012 1520   GLUCOSE 161* 05/27/2012 0749   BUN 21 05/27/2012 0749   CREATININE 7.80* 05/27/2012 0749   CALCIUM 11.1* 04/21/2012 1520   GFRNONAA 5* 04/21/2012 1520   GFRAA 6* 04/21/2012 1520    INR    Component Value Date/Time   INR 0.9 06/15/2008 0935     Intake/Output Summary (Last 24 hours) at 05/28/12 0817 Last data filed at 05/27/12 2040  Gross per 24 hour  Intake    360 ml  Output      0 ml  Net    360 ml     Assessment/Plan:  52 y.o. female is s/p:  1. ultrasound-guided access, right thigh A-V GG  2. abdominal aortogram  3. third order catheterization  4. left lower extremity runoff  5. angioplasty left popliteal artery   POD 1  -will d/c home this am so that she can go to her center today for HD -f/u with Dr. Edilia Bo in 2 weeks. -d/w Dr. Myra Gianotti about discharging pt-she is 87-90% on RA.  She is not in any distress.  Will discharge her so that she can go to her HD center after discharge for HD.  I did discuss this with the pt and she expresses understanding.   Doreatha Massed, PA-C [facility] [phone] 05/28/2012 8:17 AM

## 2012-05-28 NOTE — Progress Notes (Signed)
Discharge paperwork reviewed with patient.  Required frequent re-iteration of instructions.  Pt. Not interested in "My Chart" sign-up.  Medications reviewed with patient as well as F/U appointments.  Appeared to understand, though, again, needed re-instruction.  Discharged to Hemodialysis for 1100 appointment on [facility].  She was hesitant on going home, and stated she felt the antibiotic she was given pre-procedure made her symptomatic and short of breath.  Pt. Talked continuously for 15 minutes on R/A with no signs of dyspnea.  02 Sat. Level was 97%.  Discharged via wheelchair to main entrance, where she met a taxi cab.  Escorted by volunteer services.

## 2012-06-02 ENCOUNTER — Inpatient Hospital Stay (HOSPITAL_COMMUNITY): Admission: RE | Admit: 2012-06-02 | Payer: Medicare Other | Source: Ambulatory Visit

## 2012-06-02 NOTE — Discharge Summary (Signed)
Agree with above  [name] 

## 2012-06-02 NOTE — Progress Notes (Signed)
Agree with above  [name] 

## 2012-06-17 ENCOUNTER — Encounter: Payer: Self-pay | Admitting: Vascular Surgery

## 2012-06-18 ENCOUNTER — Encounter: Payer: Self-pay | Admitting: Vascular Surgery

## 2012-06-18 ENCOUNTER — Ambulatory Visit (INDEPENDENT_AMBULATORY_CARE_PROVIDER_SITE_OTHER): Payer: Medicare Other | Admitting: Vascular Surgery

## 2012-06-18 ENCOUNTER — Encounter (INDEPENDENT_AMBULATORY_CARE_PROVIDER_SITE_OTHER): Payer: Medicare Other | Admitting: *Deleted

## 2012-06-18 VITALS — BP 180/77 | HR 103 | Resp 18 | Ht 62.0 in | Wt 155.0 lb

## 2012-06-18 DIAGNOSIS — Z48812 Encounter for surgical aftercare following surgery on the circulatory system: Secondary | ICD-10-CM | POA: Diagnosis not present

## 2012-06-18 NOTE — Assessment & Plan Note (Signed)
Is patient has undergone successful PT a of a popliteal artery stenosis by Dr. Myra Gianotti. There is really no other disease amenable to bypass then I think her circulation in the left lower extremity is as good as we can get it. She has dry gangrene of the first toe and fourth toe of the left foot. The wound on the first toe involves the base of the second toe and this is also likely involved. This reason I have recommended transmetatarsal amputation of the foot. She also has a crack on her heel. Currently the patient is reluctant to proceed with transmetatarsal amputation. I've instructed her to continue soaking the foot daily and lukewarm.also soaks. I'll see her back in 2 weeks and hopefully at that point she'll be agreeable to proceed with surgery. She understands that even though she has undergone successful PTA of the popliteal stenosis there is some risk of nonhealing of a transmetatarsal amputation. She knows to call sooner she has problems.

## 2012-06-18 NOTE — Progress Notes (Signed)
Vascular and Vein Specialist of [place]  Patient name: [name] MRN: [id number] DOB: 10-21-1960 Sex: female  REASON FOR VISIT: follow up after PTA of a left popliteal artery stenosis.  HPI: [name] is a 52 y.o. female who I had seen in consultation on 05/21/2012 with gangrene of her left foot and infrainguinal arterial occlusive disease. She underwent arteriography and PTA of a left popliteal artery stenosis by Dr. Myra Gianotti on 05/27/2012. She had a moderate stenosis in the left superficial femoral artery which was not felt to be hemodynamically significant. She had a very tight left popliteal artery stenosis of 95% which was successfully ballooned. She had two-vessel runoff on the left via the posterior tibial and anterior tibial arteries. She comes in for follow up visit. She states the foot has felt better since her procedure. She denies fever or chills.   REVIEW OF SYSTEMS: Arly.Keller ] denotes positive finding; [  ] denotes negative finding  CARDIOVASCULAR:  [ ]  chest pain   [ ]  dyspnea on exertion    CONSTITUTIONAL:  [ ]  fever   [ ]  chills  PHYSICAL EXAM: Filed Vitals:   06/18/12 1414  BP: 180/77  Pulse: 103  Resp: 18  Height: 5\' 2"  (1.575 m)  Weight: 155 lb (70.308 kg)   Body mass index is 28.34 kg/(m^2). GENERAL: The patient is a well-nourished female, in no acute distress. The vital signs are documented above. CARDIOVASCULAR: There is a regular rate and rhythm  PULMONARY: There is good air exchange bilaterally without wheezing or rales. She has a palpable left femoral pulse. I cannot palpate pedal pulses. She has gangrene of the left first toe and fourth toe. The wound on the first toe and false the base of the second toe also.  I have reviewed her arteriogram the results of which are described above.  I have independently interpreted her arterial Doppler study today which shows a monophasic to biphasic posterior tibial signal and a biphasic anterior tibial signal on the  left. Her vessels were calcified and ABIs cannot be obtained.  MEDICAL ISSUES:  Atherosclerosis of native arteries of the extremities with gangrene Is patient has undergone successful PT a of a popliteal artery stenosis by Dr. Myra Gianotti. There is really no other disease amenable to bypass then I think her circulation in the left lower extremity is as good as we can get it. She has dry gangrene of the first toe and fourth toe of the left foot. The wound on the first toe involves the base of the second toe and this is also likely involved. This reason I have recommended transmetatarsal amputation of the foot. She also has a crack on her heel. Currently the patient is reluctant to proceed with transmetatarsal amputation. I've instructed her to continue soaking the foot daily and lukewarm.also soaks. I'll see her back in 2 weeks and hopefully at that point she'll be agreeable to proceed with surgery. She understands that even though she has undergone successful PTA of the popliteal stenosis there is some risk of nonhealing of a transmetatarsal amputation. She knows to call sooner she has problems.   DICKSON,CHRISTOPHER S [facility] of [place] [facility]: [phone]

## 2012-06-23 DIAGNOSIS — N186 End stage renal disease: Secondary | ICD-10-CM | POA: Diagnosis not present

## 2012-06-30 DIAGNOSIS — D631 Anemia in chronic kidney disease: Secondary | ICD-10-CM | POA: Diagnosis not present

## 2012-07-01 ENCOUNTER — Encounter: Payer: Self-pay | Admitting: Vascular Surgery

## 2012-07-02 ENCOUNTER — Encounter: Payer: Self-pay | Admitting: Vascular Surgery

## 2012-07-02 ENCOUNTER — Ambulatory Visit (INDEPENDENT_AMBULATORY_CARE_PROVIDER_SITE_OTHER): Payer: Medicare Other | Admitting: Vascular Surgery

## 2012-07-02 VITALS — BP 175/86 | HR 104 | Resp 18 | Ht 62.0 in | Wt 150.0 lb

## 2012-07-02 DIAGNOSIS — I70269 Atherosclerosis of native arteries of extremities with gangrene, unspecified extremity: Secondary | ICD-10-CM

## 2012-07-02 DIAGNOSIS — I739 Peripheral vascular disease, unspecified: Secondary | ICD-10-CM

## 2012-07-02 NOTE — Progress Notes (Signed)
Vascular and Vein Specialist of [facility]  Patient name: [name] MRN: [id number] DOB: December 01, 1960 Sex: female  REASON FOR VISIT: Follow up of gangrene of the left foot.  HPI: [name] is a 52 y.o. female I seen in consultation on 05/21/2012 with gangrene of the left foot. He had dry gangrene of the left great toe and some blistering on the dorsum of the foot in addition to gangrene of the fourth toe. She had evidence of infrainguinal arterial occlusive disease. She underwent an arteriogram by Dr. Myra Gianotti on 05/27/2012. She was found have a critical left popliteal artery stenosis which was successfully treated with PTCA. She had two-vessel runoff on the left via the posterior tibial and anterior tibial arteries. She comes in today for follow up of her wounds on the left foot. She is a poor historian. However she is unaware of any fever or chills.  REVIEW OF SYSTEMS: Arly.Keller ] denotes positive finding; [  ] denotes negative finding  CARDIOVASCULAR:  [ ]  chest pain   [ ]  dyspnea on exertion    CONSTITUTIONAL:  [ ]  fever   [ ]  chills  PHYSICAL EXAM: Filed Vitals:   07/02/12 1349  BP: 175/86  Pulse: 104  Resp: 18  Height: 5\' 2"  (1.575 m)  Weight: 150 lb (68.04 kg)   Body mass index is 27.43 kg/(m^2). GENERAL: The patient is a well-nourished female, in no acute distress. The vital signs are documented above. CARDIOVASCULAR: There is a regular rate and rhythm  PULMONARY: There is good air exchange bilaterally without wheezing or rales. He has dry gangrene of the left first and fourth toes. In addition the left second toe is swollen and slightly erythematous. On the plantar aspect of the second toe there is some eschar and likely underlying wound. He also has a healed decubitus but appears to be full thickness. She does have brisk Doppler signals in the dorsalis pedis and posterior tibial positions.  MEDICAL ISSUES:  GANGRENE OF THE LEFT FOOT: Given the extent of the wounds on her toes, I  have recommended left transmetatarsal amputation. This patient has a history of diabetes and chronic renal failure and is on dialysis. Certainly she is at significant risk of nonhealing a TMA but I think this is the best chance of limb salvage. Otherwise I think she would require a below the knee amputation. I've explained that I would not recommend amputating the first second and fourth toes as there would be no great advantage to this in significant risk of leaving a complicated wound which would not heal and subsequently would require further surgery. Think a transmetatarsal amputation as her best chance for limb salvage. She dialyzes on Monday Wednesdays and Fridays and I hope to do this on 07/10/2012 hour she decided she wanted to think about this further before scheduling surgery. Hopefully will hear back from him in the near future otherwise I plan on seeing her back in 2 weeks.  Deeana Atwater S V[facility] and Vein Specialists of  [place]: [phone]

## 2012-07-04 DIAGNOSIS — N2581 Secondary hyperparathyroidism of renal origin: Secondary | ICD-10-CM | POA: Diagnosis not present

## 2012-07-09 ENCOUNTER — Other Ambulatory Visit: Payer: Self-pay | Admitting: *Deleted

## 2012-07-09 ENCOUNTER — Encounter (HOSPITAL_COMMUNITY): Payer: Self-pay | Admitting: Pharmacy Technician

## 2012-07-09 ENCOUNTER — Encounter: Payer: Self-pay | Admitting: *Deleted

## 2012-07-10 ENCOUNTER — Encounter (HOSPITAL_COMMUNITY): Payer: Self-pay | Admitting: *Deleted

## 2012-07-11 DIAGNOSIS — D631 Anemia in chronic kidney disease: Secondary | ICD-10-CM | POA: Diagnosis not present

## 2012-07-11 DIAGNOSIS — N2581 Secondary hyperparathyroidism of renal origin: Secondary | ICD-10-CM | POA: Diagnosis not present

## 2012-07-11 DIAGNOSIS — N186 End stage renal disease: Secondary | ICD-10-CM | POA: Diagnosis not present

## 2012-07-14 DIAGNOSIS — N2581 Secondary hyperparathyroidism of renal origin: Secondary | ICD-10-CM | POA: Diagnosis not present

## 2012-07-14 DIAGNOSIS — N039 Chronic nephritic syndrome with unspecified morphologic changes: Secondary | ICD-10-CM | POA: Diagnosis not present

## 2012-07-14 DIAGNOSIS — N186 End stage renal disease: Secondary | ICD-10-CM | POA: Diagnosis not present

## 2012-07-14 MED ORDER — DEXTROSE 5 % IV SOLN
1.5000 g | INTRAVENOUS | Status: AC
  Administered 2012-07-15: 1.5 g via INTRAVENOUS
  Filled 2012-07-14: qty 1.5

## 2012-07-15 ENCOUNTER — Encounter (HOSPITAL_COMMUNITY): Payer: Self-pay | Admitting: Surgery

## 2012-07-15 ENCOUNTER — Inpatient Hospital Stay (HOSPITAL_COMMUNITY)
Admission: RE | Admit: 2012-07-15 | Discharge: 2012-07-23 | DRG: 239 | Disposition: A | Discharge status: Skilled Nursing Facility | Payer: Medicare Other | Source: Ambulatory Visit | Attending: Vascular Surgery | Admitting: Vascular Surgery

## 2012-07-15 ENCOUNTER — Inpatient Hospital Stay (HOSPITAL_COMMUNITY): Payer: Medicare Other

## 2012-07-15 ENCOUNTER — Encounter (HOSPITAL_COMMUNITY)
Admission: RE | Disposition: A | Discharge status: Skilled Nursing Facility | Payer: Self-pay | Source: Ambulatory Visit | Attending: Vascular Surgery

## 2012-07-15 ENCOUNTER — Encounter (HOSPITAL_COMMUNITY): Payer: Self-pay | Admitting: Certified Registered Nurse Anesthetist

## 2012-07-15 ENCOUNTER — Inpatient Hospital Stay (HOSPITAL_COMMUNITY): Payer: Medicare Other | Admitting: Certified Registered Nurse Anesthetist

## 2012-07-15 DIAGNOSIS — I120 Hypertensive chronic kidney disease with stage 5 chronic kidney disease or end stage renal disease: Secondary | ICD-10-CM | POA: Diagnosis present

## 2012-07-15 DIAGNOSIS — Z8673 Personal history of transient ischemic attack (TIA), and cerebral infarction without residual deficits: Secondary | ICD-10-CM

## 2012-07-15 DIAGNOSIS — E119 Type 2 diabetes mellitus without complications: Secondary | ICD-10-CM | POA: Diagnosis not present

## 2012-07-15 DIAGNOSIS — I6330 Cerebral infarction due to thrombosis of unspecified cerebral artery: Secondary | ICD-10-CM | POA: Diagnosis not present

## 2012-07-15 DIAGNOSIS — I96 Gangrene, not elsewhere classified: Secondary | ICD-10-CM | POA: Diagnosis not present

## 2012-07-15 DIAGNOSIS — K219 Gastro-esophageal reflux disease without esophagitis: Secondary | ICD-10-CM | POA: Diagnosis not present

## 2012-07-15 DIAGNOSIS — I10 Essential (primary) hypertension: Secondary | ICD-10-CM | POA: Diagnosis not present

## 2012-07-15 DIAGNOSIS — J150 Pneumonia due to Klebsiella pneumoniae: Secondary | ICD-10-CM | POA: Diagnosis not present

## 2012-07-15 DIAGNOSIS — Z992 Dependence on renal dialysis: Secondary | ICD-10-CM | POA: Diagnosis not present

## 2012-07-15 DIAGNOSIS — E1159 Type 2 diabetes mellitus with other circulatory complications: Secondary | ICD-10-CM | POA: Diagnosis present

## 2012-07-15 DIAGNOSIS — M62838 Other muscle spasm: Secondary | ICD-10-CM | POA: Diagnosis not present

## 2012-07-15 DIAGNOSIS — M86659 Other chronic osteomyelitis, unspecified thigh: Secondary | ICD-10-CM | POA: Diagnosis not present

## 2012-07-15 DIAGNOSIS — L97509 Non-pressure chronic ulcer of other part of unspecified foot with unspecified severity: Secondary | ICD-10-CM | POA: Diagnosis not present

## 2012-07-15 DIAGNOSIS — N2581 Secondary hyperparathyroidism of renal origin: Secondary | ICD-10-CM | POA: Diagnosis not present

## 2012-07-15 DIAGNOSIS — I739 Peripheral vascular disease, unspecified: Secondary | ICD-10-CM | POA: Diagnosis not present

## 2012-07-15 DIAGNOSIS — Z5189 Encounter for other specified aftercare: Secondary | ICD-10-CM | POA: Diagnosis not present

## 2012-07-15 DIAGNOSIS — L0291 Cutaneous abscess, unspecified: Secondary | ICD-10-CM | POA: Diagnosis not present

## 2012-07-15 DIAGNOSIS — N186 End stage renal disease: Secondary | ICD-10-CM | POA: Diagnosis present

## 2012-07-15 DIAGNOSIS — D649 Anemia, unspecified: Secondary | ICD-10-CM | POA: Diagnosis not present

## 2012-07-15 DIAGNOSIS — S98139A Complete traumatic amputation of one unspecified lesser toe, initial encounter: Secondary | ICD-10-CM | POA: Diagnosis not present

## 2012-07-15 DIAGNOSIS — I70269 Atherosclerosis of native arteries of extremities with gangrene, unspecified extremity: Principal | ICD-10-CM

## 2012-07-15 DIAGNOSIS — J90 Pleural effusion, not elsewhere classified: Secondary | ICD-10-CM | POA: Diagnosis not present

## 2012-07-15 DIAGNOSIS — L0390 Cellulitis, unspecified: Secondary | ICD-10-CM | POA: Diagnosis not present

## 2012-07-15 DIAGNOSIS — R509 Fever, unspecified: Secondary | ICD-10-CM | POA: Diagnosis not present

## 2012-07-15 DIAGNOSIS — N912 Amenorrhea, unspecified: Secondary | ICD-10-CM | POA: Diagnosis not present

## 2012-07-15 DIAGNOSIS — R4182 Altered mental status, unspecified: Secondary | ICD-10-CM | POA: Diagnosis present

## 2012-07-15 DIAGNOSIS — E213 Hyperparathyroidism, unspecified: Secondary | ICD-10-CM | POA: Diagnosis not present

## 2012-07-15 HISTORY — DX: Pneumonia, unspecified organism: J18.9

## 2012-07-15 HISTORY — PX: TRANSMETATARSAL AMPUTATION: SHX6197

## 2012-07-15 HISTORY — DX: Cerebral infarction, unspecified: I63.9

## 2012-07-15 HISTORY — DX: Peripheral vascular disease, unspecified: I73.9

## 2012-07-15 HISTORY — DX: Anemia, unspecified: D64.9

## 2012-07-15 HISTORY — DX: Gastro-esophageal reflux disease without esophagitis: K21.9

## 2012-07-15 LAB — POCT I-STAT 4, (NA,K, GLUC, HGB,HCT)
Glucose, Bld: 228 mg/dL — ABNORMAL HIGH (ref 70–99)
HCT: 36 % (ref 36.0–46.0)
Sodium: 134 mEq/L — ABNORMAL LOW (ref 135–145)

## 2012-07-15 LAB — CBC
HCT: 31.1 % — ABNORMAL LOW (ref 36.0–46.0)
MCH: 26.8 pg (ref 26.0–34.0)
MCV: 85.9 fL (ref 78.0–100.0)
Platelets: 366 10*3/uL (ref 150–400)
RDW: 16.8 % — ABNORMAL HIGH (ref 11.5–15.5)

## 2012-07-15 LAB — SURGICAL PCR SCREEN
MRSA, PCR: NEGATIVE
Staphylococcus aureus: NEGATIVE

## 2012-07-15 LAB — GLUCOSE, CAPILLARY
Glucose-Capillary: 208 mg/dL — ABNORMAL HIGH (ref 70–99)
Glucose-Capillary: 208 mg/dL — ABNORMAL HIGH (ref 70–99)

## 2012-07-15 LAB — CREATININE, SERUM: GFR calc Af Amer: 6 mL/min — ABNORMAL LOW (ref >90)

## 2012-07-15 SURGERY — AMPUTATION, FOOT, TRANSMETATARSAL
Anesthesia: General | Site: Foot | Laterality: Left | Wound class: Dirty or Infected

## 2012-07-15 MED ORDER — ONDANSETRON HCL 4 MG/2ML IJ SOLN
4.0000 mg | Freq: Four times a day (QID) | INTRAMUSCULAR | Status: DC | PRN
  Filled 2012-07-15: qty 2

## 2012-07-15 MED ORDER — PANTOPRAZOLE SODIUM 40 MG PO TBEC
80.0000 mg | DELAYED_RELEASE_TABLET | Freq: Every day | ORAL | Status: DC
  Filled 2012-07-15 (×3): qty 2
  Filled 2012-07-15: qty 1
  Filled 2012-07-15: qty 2

## 2012-07-15 MED ORDER — GLIPIZIDE 10 MG PO TABS
10.0000 mg | ORAL_TABLET | Freq: Two times a day (BID) | ORAL | Status: DC
  Administered 2012-07-16 – 2012-07-23 (×14): 10 mg via ORAL
  Filled 2012-07-15 (×17): qty 1

## 2012-07-15 MED ORDER — MIDAZOLAM HCL 5 MG/5ML IJ SOLN
INTRAMUSCULAR | Status: DC | PRN
  Administered 2012-07-15: 2 mg via INTRAVENOUS

## 2012-07-15 MED ORDER — GUAIFENESIN-DM 100-10 MG/5ML PO SYRP
15.0000 mL | ORAL_SOLUTION | ORAL | Status: DC | PRN
  Filled 2012-07-15: qty 15

## 2012-07-15 MED ORDER — PROPOFOL 10 MG/ML IV BOLUS
INTRAVENOUS | Status: DC | PRN
  Administered 2012-07-15: 110 mg via INTRAVENOUS

## 2012-07-15 MED ORDER — 0.9 % SODIUM CHLORIDE (POUR BTL) OPTIME
TOPICAL | Status: DC | PRN
  Administered 2012-07-15: 1000 mL

## 2012-07-15 MED ORDER — ACETAMINOPHEN 325 MG RE SUPP
325.0000 mg | RECTAL | Status: DC | PRN
  Filled 2012-07-15: qty 2

## 2012-07-15 MED ORDER — DIAZEPAM 2 MG PO TABS
2.0000 mg | ORAL_TABLET | Freq: Four times a day (QID) | ORAL | Status: DC | PRN
  Administered 2012-07-15 – 2012-07-21 (×3): 2 mg via ORAL
  Filled 2012-07-15 (×3): qty 1

## 2012-07-15 MED ORDER — MEPERIDINE HCL 25 MG/ML IJ SOLN
6.2500 mg | INTRAMUSCULAR | Status: DC | PRN
Start: 2012-07-15 — End: 2012-07-15

## 2012-07-15 MED ORDER — MUPIROCIN 2 % EX OINT
TOPICAL_OINTMENT | Freq: Two times a day (BID) | CUTANEOUS | Status: DC
  Administered 2012-07-15: 1 via NASAL
  Administered 2012-07-18 – 2012-07-23 (×10): via NASAL

## 2012-07-15 MED ORDER — SORBITOL 70 % SOLN: 30.0000 mL | Status: DC | PRN

## 2012-07-15 MED ORDER — DOCUSATE SODIUM 283 MG RE ENEM: 1.0000 | ENEMA | RECTAL | Status: DC | PRN

## 2012-07-15 MED ORDER — HYDROMORPHONE HCL PF 1 MG/ML IJ SOLN
INTRAMUSCULAR | Status: AC
  Filled 2012-07-15: qty 1

## 2012-07-15 MED ORDER — FENTANYL CITRATE 0.05 MG/ML IJ SOLN
INTRAMUSCULAR | Status: DC | PRN
  Administered 2012-07-15 (×3): 50 ug via INTRAVENOUS
  Administered 2012-07-15: 100 ug via INTRAVENOUS

## 2012-07-15 MED ORDER — ENOXAPARIN SODIUM 30 MG/0.3ML ~~LOC~~ SOLN
30.0000 mg | SUBCUTANEOUS | Status: DC
  Administered 2012-07-17 (×2): 30 mg via SUBCUTANEOUS
  Filled 2012-07-15 (×6): qty 0.3

## 2012-07-15 MED ORDER — DEXTROSE 5 % IV SOLN
1.5000 g | Freq: Two times a day (BID) | INTRAVENOUS | Status: DC
  Administered 2012-07-15: 1.5 g via INTRAVENOUS
  Filled 2012-07-15 (×2): qty 1.5

## 2012-07-15 MED ORDER — OXYCODONE HCL 5 MG/5ML PO SOLN: 5.0000 mg | Freq: Once | ORAL | Status: AC | PRN

## 2012-07-15 MED ORDER — HYDROXYZINE HCL 25 MG PO TABS
25.0000 mg | ORAL_TABLET | Freq: Three times a day (TID) | ORAL | Status: DC | PRN
  Filled 2012-07-15: qty 1

## 2012-07-15 MED ORDER — BACITRACIN ZINC 500 UNIT/GM EX OINT
TOPICAL_OINTMENT | CUTANEOUS | Status: DC | PRN
  Administered 2012-07-15: 1 via TOPICAL

## 2012-07-15 MED ORDER — NEPRO/CARBSTEADY PO LIQD: 237.0000 mL | Freq: Three times a day (TID) | ORAL | Status: DC | PRN

## 2012-07-15 MED ORDER — SODIUM CHLORIDE 0.9 % IJ SOLN
3.0000 mL | INTRAMUSCULAR | Status: DC | PRN
  Administered 2012-07-17: 3 mL via INTRAVENOUS

## 2012-07-15 MED ORDER — LACTATED RINGERS IV SOLN
INTRAVENOUS | Status: DC | PRN
  Administered 2012-07-15: 13:00:00 via INTRAVENOUS

## 2012-07-15 MED ORDER — HYDROMORPHONE HCL PF 1 MG/ML IJ SOLN
0.2500 mg | INTRAMUSCULAR | Status: DC | PRN
  Administered 2012-07-15 (×3): 0.5 mg via INTRAVENOUS

## 2012-07-15 MED ORDER — ASPIRIN EC 81 MG PO TBEC
81.0000 mg | DELAYED_RELEASE_TABLET | Freq: Every day | ORAL | Status: DC
  Administered 2012-07-16 – 2012-07-23 (×8): 81 mg via ORAL
  Filled 2012-07-15 (×8): qty 1

## 2012-07-15 MED ORDER — MUPIROCIN 2 % EX OINT
TOPICAL_OINTMENT | CUTANEOUS | Status: AC
  Administered 2012-07-15: 1 via NASAL
  Filled 2012-07-15: qty 22

## 2012-07-15 MED ORDER — HYDRALAZINE HCL 20 MG/ML IJ SOLN: 10.0000 mg | INTRAMUSCULAR | Status: DC | PRN

## 2012-07-15 MED ORDER — INSULIN ASPART 100 UNIT/ML ~~LOC~~ SOLN
0.0000 [IU] | Freq: Three times a day (TID) | SUBCUTANEOUS | Status: DC
  Administered 2012-07-16: 5 [IU] via SUBCUTANEOUS
  Administered 2012-07-17: 9 [IU] via SUBCUTANEOUS
  Administered 2012-07-17: 2 [IU] via SUBCUTANEOUS
  Administered 2012-07-18: 7 [IU] via SUBCUTANEOUS

## 2012-07-15 MED ORDER — ZOLPIDEM TARTRATE 5 MG PO TABS: 5.0000 mg | ORAL_TABLET | Freq: Every evening | ORAL | Status: DC | PRN

## 2012-07-15 MED ORDER — DOCUSATE SODIUM 100 MG PO CAPS
100.0000 mg | ORAL_CAPSULE | Freq: Every day | ORAL | Status: DC
  Administered 2012-07-16 – 2012-07-17 (×2): 100 mg via ORAL
  Filled 2012-07-15 (×8): qty 1

## 2012-07-15 MED ORDER — SODIUM CHLORIDE 0.9 % IV SOLN: INTRAVENOUS | Status: DC

## 2012-07-15 MED ORDER — PHENOL 1.4 % MT LIQD
1.0000 | OROMUCOSAL | Status: DC | PRN
  Filled 2012-07-15: qty 177

## 2012-07-15 MED ORDER — SODIUM CHLORIDE 0.9 % IV SOLN: 250.0000 mL | INTRAVENOUS | Status: DC | PRN

## 2012-07-15 MED ORDER — AMLODIPINE BESYLATE 10 MG PO TABS
10.0000 mg | ORAL_TABLET | Freq: Every day | ORAL | Status: DC
  Administered 2012-07-16 – 2012-07-22 (×6): 10 mg via ORAL
  Filled 2012-07-15 (×9): qty 1

## 2012-07-15 MED ORDER — BACITRACIN ZINC 500 UNIT/GM EX OINT
TOPICAL_OINTMENT | CUTANEOUS | Status: AC
  Filled 2012-07-15: qty 15

## 2012-07-15 MED ORDER — ACETAMINOPHEN 325 MG PO TABS
325.0000 mg | ORAL_TABLET | ORAL | Status: DC | PRN
  Filled 2012-07-15 (×2): qty 2

## 2012-07-15 MED ORDER — MORPHINE SULFATE 2 MG/ML IJ SOLN
2.0000 mg | INTRAMUSCULAR | Status: DC | PRN
  Administered 2012-07-15 (×2): 4 mg via INTRAVENOUS
  Administered 2012-07-16: 2 mg via INTRAVENOUS
  Filled 2012-07-15: qty 1
  Filled 2012-07-15 (×2): qty 2

## 2012-07-15 MED ORDER — ASPIRIN 81 MG PO TABS: 81.0000 mg | ORAL_TABLET | Freq: Every day | ORAL | Status: DC

## 2012-07-15 MED ORDER — OXYCODONE HCL 5 MG PO TABS
5.0000 mg | ORAL_TABLET | Freq: Once | ORAL | Status: AC | PRN
  Administered 2012-07-15: 5 mg via ORAL

## 2012-07-15 MED ORDER — CAMPHOR-MENTHOL 0.5-0.5 % EX LOTN
1.0000 "application " | TOPICAL_LOTION | Freq: Three times a day (TID) | CUTANEOUS | Status: DC | PRN
  Filled 2012-07-15: qty 222

## 2012-07-15 MED ORDER — LABETALOL HCL 5 MG/ML IV SOLN
10.0000 mg | INTRAVENOUS | Status: DC | PRN
  Filled 2012-07-15: qty 4

## 2012-07-15 MED ORDER — ONDANSETRON HCL 4 MG/2ML IJ SOLN: 4.0000 mg | Freq: Once | INTRAMUSCULAR | Status: DC | PRN

## 2012-07-15 MED ORDER — METOPROLOL TARTRATE 1 MG/ML IV SOLN
2.0000 mg | INTRAVENOUS | Status: DC | PRN
  Filled 2012-07-15: qty 5

## 2012-07-15 MED ORDER — SODIUM CHLORIDE 0.9 % IJ SOLN
3.0000 mL | Freq: Two times a day (BID) | INTRAMUSCULAR | Status: DC
  Administered 2012-07-15 – 2012-07-23 (×13): 3 mL via INTRAVENOUS

## 2012-07-15 MED ORDER — PHENYLEPHRINE HCL 10 MG/ML IJ SOLN
INTRAMUSCULAR | Status: DC | PRN
  Administered 2012-07-15: 40 ug via INTRAVENOUS

## 2012-07-15 MED ORDER — OXYCODONE HCL 5 MG PO TABS
ORAL_TABLET | ORAL | Status: AC
  Filled 2012-07-15: qty 1

## 2012-07-15 MED ORDER — ONDANSETRON HCL 4 MG/2ML IJ SOLN
INTRAMUSCULAR | Status: DC | PRN
  Administered 2012-07-15: 4 mg via INTRAVENOUS

## 2012-07-15 MED ORDER — RENA-VITE PO TABS
1.0000 | ORAL_TABLET | Freq: Every day | ORAL | Status: DC
  Filled 2012-07-15: qty 1

## 2012-07-15 MED ORDER — CALCIUM CARBONATE ANTACID 500 MG PO CHEW
2.0000 | CHEWABLE_TABLET | Freq: Three times a day (TID) | ORAL | Status: DC
  Administered 2012-07-16 – 2012-07-18 (×6): 400 mg via ORAL
  Administered 2012-07-20: 200 mg via ORAL
  Filled 2012-07-15 (×20): qty 2

## 2012-07-15 MED ORDER — HYDROCODONE-ACETAMINOPHEN 5-325 MG PO TABS
1.0000 | ORAL_TABLET | Freq: Four times a day (QID) | ORAL | Status: DC | PRN
  Administered 2012-07-16 – 2012-07-17 (×3): 2 via ORAL
  Administered 2012-07-18: 1 via ORAL
  Administered 2012-07-18: 2 via ORAL
  Administered 2012-07-19 – 2012-07-20 (×3): 1 via ORAL
  Administered 2012-07-21: 2 via ORAL
  Filled 2012-07-15: qty 2
  Filled 2012-07-15: qty 1
  Filled 2012-07-15: qty 2
  Filled 2012-07-15 (×2): qty 1
  Filled 2012-07-15 (×4): qty 2

## 2012-07-15 MED ORDER — LIDOCAINE HCL (CARDIAC) 20 MG/ML IV SOLN
INTRAVENOUS | Status: DC | PRN
  Administered 2012-07-15: 100 mg via INTRAVENOUS

## 2012-07-15 SURGICAL SUPPLY — 39 items
BANDAGE CONFORM 3  STR LF (GAUZE/BANDAGES/DRESSINGS) ×2 IMPLANT
BANDAGE ELASTIC 4 VELCRO ST LF (GAUZE/BANDAGES/DRESSINGS) ×2 IMPLANT
BANDAGE GAUZE ELAST BULKY 4 IN (GAUZE/BANDAGES/DRESSINGS) ×2 IMPLANT
BLADE AVERAGE 25X9 (BLADE) ×1 IMPLANT
CANISTER SUCTION 2500CC (MISCELLANEOUS) ×2 IMPLANT
CLIP TI MEDIUM 6 (CLIP) ×2 IMPLANT
CLOTH BEACON ORANGE TIMEOUT ST (SAFETY) ×2 IMPLANT
COVER SURGICAL LIGHT HANDLE (MISCELLANEOUS) ×2 IMPLANT
DRAPE EXTREMITY T 121X128X90 (DRAPE) ×2 IMPLANT
DRSG ADAPTIC 3X8 NADH LF (GAUZE/BANDAGES/DRESSINGS) ×2 IMPLANT
ELECT REM PT RETURN 9FT ADLT (ELECTROSURGICAL) ×2
ELECTRODE REM PT RTRN 9FT ADLT (ELECTROSURGICAL) ×1 IMPLANT
GLOVE BIO SURGEON STRL SZ 6.5 (GLOVE) ×2 IMPLANT
GLOVE BIO SURGEON STRL SZ7.5 (GLOVE) ×2 IMPLANT
GLOVE BIOGEL PI IND STRL 6.5 (GLOVE) ×1 IMPLANT
GLOVE BIOGEL PI IND STRL 7.0 (GLOVE) ×1 IMPLANT
GLOVE BIOGEL PI IND STRL 8 (GLOVE) ×1 IMPLANT
GLOVE BIOGEL PI INDICATOR 6.5 (GLOVE) ×1
GLOVE BIOGEL PI INDICATOR 7.0 (GLOVE) ×1
GLOVE BIOGEL PI INDICATOR 8 (GLOVE) ×1
GLOVE ECLIPSE 6.5 STRL STRAW (GLOVE) ×1 IMPLANT
GOWN STRL NON-REIN LRG LVL3 (GOWN DISPOSABLE) ×5 IMPLANT
KIT BASIN OR (CUSTOM PROCEDURE TRAY) ×2 IMPLANT
KIT ROOM TURNOVER OR (KITS) ×2 IMPLANT
NS IRRIG 1000ML POUR BTL (IV SOLUTION) ×2 IMPLANT
PACK GENERAL/GYN (CUSTOM PROCEDURE TRAY) ×2 IMPLANT
PAD ARMBOARD 7.5X6 YLW CONV (MISCELLANEOUS) ×4 IMPLANT
SPECIMEN JAR MEDIUM (MISCELLANEOUS) ×2 IMPLANT
SPONGE GAUZE 4X4 12PLY (GAUZE/BANDAGES/DRESSINGS) ×2 IMPLANT
STAPLER VISISTAT 35W (STAPLE) ×1 IMPLANT
SUT ETHILON 3 0 PS 1 (SUTURE) ×2 IMPLANT
SUT VIC AB 3-0 SH 27 (SUTURE) ×4
SUT VIC AB 3-0 SH 27X BRD (SUTURE) IMPLANT
SWAB COLLECTION DEVICE MRSA (MISCELLANEOUS) ×2 IMPLANT
TOWEL OR 17X24 6PK STRL BLUE (TOWEL DISPOSABLE) ×2 IMPLANT
TOWEL OR 17X26 10 PK STRL BLUE (TOWEL DISPOSABLE) ×2 IMPLANT
TUBE ANAEROBIC SPECIMEN COL (MISCELLANEOUS) ×1 IMPLANT
UNDERPAD 30X30 INCONTINENT (UNDERPADS AND DIAPERS) ×2 IMPLANT
WATER STERILE IRR 1000ML POUR (IV SOLUTION) ×2 IMPLANT

## 2012-07-15 NOTE — Interval H&P Note (Signed)
History and Physical Interval Note:  07/15/2012 1:06 PM  [name]  has presented today for surgery, with the diagnosis of gangrene left foot  The various methods of treatment have been discussed with the patient and family. After consideration of risks, benefits and other options for treatment, the patient has consented to  Procedure(s): TRANSMETATARSAL AMPUTATION (Left) as a surgical intervention .  The patient's history has been reviewed, patient examined, no change in status, stable for surgery.  I have reviewed the patient's chart and labs.  Questions were answered to the patient's satisfaction.     DICKSON,CHRISTOPHER S

## 2012-07-15 NOTE — Anesthesia Procedure Notes (Signed)
Procedure Name: LMA Insertion Date/Time: 07/15/2012 1:49 PM Performed by: Orvilla Fus A Pre-anesthesia Checklist: Patient identified, Timeout performed, Emergency Drugs available, Suction available and Patient being monitored Patient Re-evaluated:Patient Re-evaluated prior to inductionOxygen Delivery Method: Circle system utilized Preoxygenation: Pre-oxygenation with 100% oxygen Intubation Type: IV induction Ventilation: Mask ventilation without difficulty LMA: LMA inserted LMA Size: 4.0 Number of attempts: 1 Placement Confirmation: breath sounds checked- equal and bilateral and positive ETCO2 Tube secured with: Tape Dental Injury: Teeth and Oropharynx as per pre-operative assessment

## 2012-07-15 NOTE — Interval H&P Note (Signed)
History and Physical Interval Note:  07/15/2012 12:18 PM  [name]  has presented today for surgery, with the diagnosis of gangrene left foot  The various methods of treatment have been discussed with the patient and family. After consideration of risks, benefits and other options for treatment, the patient has consented to  Procedure(s): TRANSMETATARSAL AMPUTATION (Left) as a surgical intervention .  The patient's history has been reviewed, patient examined, no change in status, stable for surgery.  I have reviewed the patient's chart and labs.  Questions were answered to the patient's satisfaction.     [name] S

## 2012-07-15 NOTE — Op Note (Signed)
NAME: [name]   MRN: [id number] DOB: Dec 11, 1960    DATE OF OPERATION: 07/15/2012  PREOP DIAGNOSIS: gangrene of the left foot  POSTOP DIAGNOSIS: same  PROCEDURE: left transmetatarsal amputation  SURGEON: Di Kindle. Edilia Bo, MD, FACS  ASSIST: none  ANESTHESIA: Gen.   EBL: minimal  INDICATIONS: [name] is a 52 y.o. female who presented with gangrene of the left forefoot. She underwent endovascular revascularization and presents now for transmetatarsal amputation. She has an extensive infection. Given her diabetes and chronic renal disease she is at significant risk for nonhealing.  FINDINGS: there was extensive infection and purulence. Intraoperative cultures were sent. Tissue appeared reasonably well perfused.  TECHNIQUE: Patient was taken to the operating room and received a general anesthetic. Left foot was prepped and draped in the usual sterile fashion. An incision was made on the dorsum of the foot across the distal metatarsal bones. A longer posterior flap on the plantar aspect of the foot was made. The dissection was carried down to the metatarsals. Periosteum was elevated. Each of the metatarsal was divided using a TPS saw. Small arterial branches were clipped. All devitalized tissue was debrided. There was purulence along the tendon sheaths which was cultured. The wound was irrigated with copious amounts of saline. Hemostasis was obtained. The deep layer was closed with interrupted 3-0 Vicryl's. The skin was closed with staples. A sterile dressing was applied. The patient tolerated the procedure well and was transferred to the recovery room in stable condition. All needle and sponge counts were correct.  Waverly Ferrari, MD, FACS [facility] Specialists of [facility]  DATE OF DICTATION:   07/15/2012

## 2012-07-15 NOTE — Progress Notes (Signed)
Orthopedic Tech Progress Note Patient Details:  [name] 1960/11/10 [id number]  Ortho Devices Type of Ortho Device: Postop shoe/boot Ortho Device/Splint Location: left foot Ortho Device/Splint Interventions: Application   [name] 07/15/2012, 5:44 PM

## 2012-07-15 NOTE — H&P (View-Only) (Signed)
Vascular and Vein Specialist of [facility]  Patient name: [name] MRN: [id number] DOB: 11-22-1960 Sex: female  REASON FOR VISIT: Follow up of gangrene of the left foot.  HPI: [name] is a 52 y.o. female I seen in consultation on 05/21/2012 with gangrene of the left foot. He had dry gangrene of the left great toe and some blistering on the dorsum of the foot in addition to gangrene of the fourth toe. She had evidence of infrainguinal arterial occlusive disease. She underwent an arteriogram by Dr. Myra Gianotti on 05/27/2012. She was found have a critical left popliteal artery stenosis which was successfully treated with PTCA. She had two-vessel runoff on the left via the posterior tibial and anterior tibial arteries. She comes in today for follow up of her wounds on the left foot. She is a poor historian. However she is unaware of any fever or chills.  REVIEW OF SYSTEMS: Arly.Keller ] denotes positive finding; [  ] denotes negative finding  CARDIOVASCULAR:  [ ]  chest pain   [ ]  dyspnea on exertion    CONSTITUTIONAL:  [ ]  fever   [ ]  chills  PHYSICAL EXAM: Filed Vitals:   07/02/12 1349  BP: 175/86  Pulse: 104  Resp: 18  Height: 5\' 2"  (1.575 m)  Weight: 150 lb (68.04 kg)   Body mass index is 27.43 kg/(m^2). GENERAL: The patient is a well-nourished female, in no acute distress. The vital signs are documented above. CARDIOVASCULAR: There is a regular rate and rhythm  PULMONARY: There is good air exchange bilaterally without wheezing or rales. He has dry gangrene of the left first and fourth toes. In addition the left second toe is swollen and slightly erythematous. On the plantar aspect of the second toe there is some eschar and likely underlying wound. He also has a healed decubitus but appears to be full thickness. She does have brisk Doppler signals in the dorsalis pedis and posterior tibial positions.  MEDICAL ISSUES:  GANGRENE OF THE LEFT FOOT: Given the extent of the wounds on her toes, I  have recommended left transmetatarsal amputation. This patient has a history of diabetes and chronic renal failure and is on dialysis. Certainly she is at significant risk of nonhealing a TMA but I think this is the best chance of limb salvage. Otherwise I think she would require a below the knee amputation. I've explained that I would not recommend amputating the first second and fourth toes as there would be no great advantage to this in significant risk of leaving a complicated wound which would not heal and subsequently would require further surgery. Think a transmetatarsal amputation as her best chance for limb salvage. She dialyzes on Monday Wednesdays and Fridays and I hope to do this on 07/10/2012 hour she decided she wanted to think about this further before scheduling surgery. Hopefully will hear back from him in the near future otherwise I plan on seeing her back in 2 weeks.  DICKSON,CHRISTOPHER S [facility] Special[facility] of [place] [facility]: [phone]

## 2012-07-15 NOTE — Transfer of Care (Signed)
Immediate Anesthesia Transfer of Care Note  Patient: [name]  Procedure(s) Performed: Procedure(s): TRANSMETATARSAL AMPUTATION (Left)  Patient Location: PACU  Anesthesia Type:General  Level of Consciousness: awake and patient cooperative  Airway & Oxygen Therapy: Patient Spontanous Breathing and Patient connected to nasal cannula oxygen  Post-op Assessment: Report given to PACU RN, Post -op Vital signs reviewed and stable and Patient moving all extremities  Post vital signs: Reviewed and stable  Complications: No apparent anesthesia complications

## 2012-07-15 NOTE — Progress Notes (Signed)
Dr.Ossey states okay to take pt to  Her room at this time. Will sign out shortly

## 2012-07-15 NOTE — Consult Note (Signed)
[facility] Renal Consultation Note    Indication for Consultation:  Management of ESRD/hemodialysis; anemia, hypertension/volume and secondary hyperparathyroidism  HPI: [name] is a 52 y.o. female with hx of DM 2, CVA, ESRD, PVD and HTN who was admitted today with gangrene of left forefoot not healing after endovascular revasularization. She underwent TMA wtihout difficulty. Post-op is a little drowsy, but orients without much difficutly.  No current complaints, gets HD thru thigh graft on R side. Does HD MWF at [facility].   Past Medical History  Diagnosis Date  . ESRD (end stage renal disease)   . Type 2 diabetes mellitus   . Hypertension   . CVA (cerebral infarction)     right parietal 05/19/2000  . Vaginal bleeding   . Hyperparathyroidism   . Pneumonia   . Stroke     2002,no residual  . Peripheral vascular disease   . GERD (gastroesophageal reflux disease)   . Anemia    Past Surgical History  Procedure Laterality Date  . Parathyroidectomy with autotransplant  12/07/2010  . Knee surgery      right, x2  . Brachial artery graft      x2 for dialysis  . Av fistula placement      upper rt thigh  . Dental extractions Bilateral   . Dilation and curettage of uterus      hx: of 1986   Family History  Problem Relation Age of Onset  . Hypertension Father   . Kidney disease Mother    Social History:  reports that she has never smoked. She has never used smokeless tobacco. She reports that  drinks alcohol. She reports that she does not use illicit drugs. Allergies  Allergen Reactions  . Doxycycline Other (See Comments)    Unknown reaction  . Peroxyl (Hydrogen Peroxide) Hives and Rash   Prior to Admission medications   Medication Sig Start Date End Date Taking? Authorizing Provider  Acetaminophen (TYLENOL PO) Take 1-2 tablets by mouth See admin instructions. Uses at dialysis Mon, Wed, Fridays each week for muscle spasms   Yes Historical Provider, MD   amLODipine (NORVASC) 10 MG tablet Take 10 mg by mouth daily.     Yes Historical Provider, MD  aspirin 81 MG tablet Take 81 mg by mouth daily.     Yes Historical Provider, MD  calcium carbonate (TUMS - DOSED IN MG ELEMENTAL CALCIUM) 500 MG chewable tablet Chew 2 tablets by mouth 3 (three) times daily with meals.    Yes Historical Provider, MD  diazepam (VALIUM) 2 MG tablet Take 2 mg by mouth every 6 (six) hours as needed. For muscle spasms   Yes Historical Provider, MD  esomeprazole (NEXIUM) 40 MG capsule Take 40 mg by mouth daily as needed. For heartburn   Yes Historical Provider, MD  glipiZIDE (GLUCOTROL) 10 MG tablet Take 10 mg by mouth 2 (two) times daily before a meal.    Yes Historical Provider, MD  HYDROcodone-acetaminophen (NORCO/VICODIN) 5-325 MG per tablet Take 1 tablet by mouth every 6 (six) hours as needed. For pain   Yes Historical Provider, MD  LIDOCAINE EX Apply 1 application topically See admin instructions. Use 1 hour prior to dialysis as needed for pain on Mon, Wed and Fri.   Yes Historical Provider, MD  multivitamin (RENA-VIT) TABS tablet Take 1 tablet by mouth daily.   Yes Historical Provider, MD   Current Facility-Administered Medications  Medication Dose Route Frequency Provider Last Rate Last Dose  . 0.9 %  sodium chloride infusion   Intravenous Continuous Chuck Hint, MD      . 0.9 % irrigation (POUR BTL)    PRN Chuck Hint, MD   1,000 mL at 07/15/12 1321  . bacitracin ointment    PRN Chuck Hint, MD   1 application at 07/15/12 1321  . HYDROmorphone (DILAUDID) injection 0.25-0.5 mg  0.25-0.5 mg Intravenous Q5 min PRN Aubery Lapping, MD      . meperidine (DEMEROL) injection 6.25-12.5 mg  6.25-12.5 mg Intravenous Q5 min PRN Aubery Lapping, MD      . mupirocin ointment (BACTROBAN) 2 %   Nasal BID Chuck Hint, MD   1 application at 07/15/12 1156  . ondansetron (ZOFRAN) injection 4 mg  4 mg Intravenous Once PRN Aubery Lapping, MD       . oxyCODONE (Oxy IR/ROXICODONE) immediate release tablet 5 mg  5 mg Oral Once PRN Aubery Lapping, MD       Or  . oxyCODONE (ROXICODONE) 5 MG/5ML solution 5 mg  5 mg Oral Once PRN Aubery Lapping, MD       Facility-Administered Medications Ordered in Other Encounters  Medication Dose Route Frequency Provider Last Rate Last Dose  . fentaNYL (SUBLIMAZE) injection    PRN Jodell Cipro, CRNA   50 mcg at 07/15/12 1412  . lactated ringers infusion    Continuous PRN Jodell Cipro, CRNA      . lidocaine (cardiac) 100 mg/35ml (XYLOCAINE) 20 MG/ML injection 2%    PRN Jodell Cipro, CRNA   100 mg at 07/15/12 1347  . midazolam (VERSED) 5 MG/5ML injection    PRN Jodell Cipro, CRNA   2 mg at 07/15/12 1331  . ondansetron (ZOFRAN) injection    PRN Jodell Cipro, CRNA   4 mg at 07/15/12 1429  . phenylephrine (NEO-SYNEPHRINE) injection    PRN Jodell Cipro, CRNA   40 mcg at 07/15/12 1428  . propofol (DIPRIVAN) 10 mg/mL bolus/IV push    PRN Jodell Cipro, CRNA   110 mg at 07/15/12 1347   Labs: Basic Metabolic Panel:  Recent Labs Lab 07/15/12 1323  NA 134*  K 4.2  GLUCOSE 228*   CBC:  Recent Labs Lab 07/15/12 1323  HGB 12.2  HCT 36.0   CBG:  Recent Labs Lab 07/15/12 1247  GLUCAP 208*   Studies/Results: Dg Chest Port 1 View  07/15/2012  *RADIOLOGY REPORT*  Clinical Data: Hypertension.  PORTABLE CHEST - 1 VIEW  Comparison: April 21, 2012.  Findings: Cardiomediastinal silhouette appears normal.  Mild bilateral pleural effusions are noted.  Some degree of atelectasis or pneumonia in the left lung base appears to be present.  Bony thorax is intact.  IMPRESSION: Mild bilateral pleural effusions.  Probable left basilar opacity concerning for pneumonia or subsegmental atelectasis.   Original Report Authenticated By: Lupita Raider.,  M.D.     ROS:   Physical Exam: Filed Vitals:   07/15/12 0944 07/15/12 1242  BP: 167/77   Pulse: 108   Temp: 101.2 F (38.4 C) 98.7 F (37.1 C)  TempSrc:   Oral  Resp: 20   SpO2: 100%      General: Well developed, well nourished, in no acute distress. Head: Normocephalic, atraumatic, sclera non-icteric, mucus membranes are moist Neck: Supple. JVD not elevated. Lungs: Clear bilaterally to auscultation without wheezes, rales, or rhonchi. Breathing is unlabored. Heart: RRR with S1 S2. No murmurs, rubs, or gallops appreciated. Abdomen: Soft,  non-tender, non-distended with normoactive bowel sounds. No rebound/guarding. No obvious abdominal masses. M-S:  Strength and tone appear normal for age. Lower extremities:without edema or ischemic changes, L  Foot with mid-foot amputation, wrapped, no LE or UE edema Neuro: Alert and oriented X 3. Moves all extremities spontaneously. Psych:  Responds to questions appropriately with a normal affect. Dialysis Access: Rt thigh AVG, old accesses bilat UE's not functional  Dialysis Orders: Center: [facility]  on MWF . EDW 67 kg Optiflux 160 HD Bath 2K/ 3.5Ca  Time 3:30 Heparin 5000. Access R thigh AVG BFR 450 DFR A1.5    Hectorol 0 mcg IV/HD Epogen 18000   Units IV/HD  Venofer  0  (Labs 2/26 - Tsat 35%, P 2.6, PTH <5.5)  Assessment/Plan: 1. Dry Gangrene of Left Foot  - TMA 3/25 by Dr. Edilia Bo 2. ESRD -  MWF @ [facility]. K+ 4.2 Next HD tomorrow on regular schedule. No Heparin 3. Hypertension/volume  - 167/77.  (Home med Amlodipine 10 mg qd?). 4kg up by weights today, plan HD in am.  4. Anemia  - Hgb 12.2. On op Epo 18000u. Last Tsat 35% on 2/26. Hold ESA's and monitor hgb closely post-op. No IV Fe. 5. Metabolic bone disease -  Last corrected Ca 10.5 op on 3.5 Ca bath. Phos well controlled and PTH <5.5. Order renal panel. S/P PTX 6. Nutrition - Last Albumin 4.0. Renal diet and multivitamin 7. DM - per primary  Claud Kelp, PA-C [facility] Pager [phone] 07/15/2012, 2:38 PM   Patient seen and examined.  Agree with assessment and plan as above.  Patient is stable post-op from left TMA surgery today.  She is a  bit sleepy from anesthetic in OR, but otherwise arouseable and making sense.  Plan HD tomorrow, will follow.  Vinson Moselle  MD [phone] pgr    [phone] cell 07/15/2012, 5:35 PM

## 2012-07-15 NOTE — Preoperative (Signed)
Beta Blockers   Reason not to administer Beta Blockers:Not Applicable 

## 2012-07-15 NOTE — Anesthesia Preprocedure Evaluation (Addendum)
Anesthesia Evaluation  Patient identified by MRN, date of birth, ID band Patient awake    Reviewed: Allergy & Precautions, H&P , NPO status , Patient's Chart, lab work & pertinent test results  Airway Mallampati: I TM Distance: >3 FB Neck ROM: Full    Dental  (+) Missing, Dental Advisory Given and Poor Dentition   Pulmonary pneumonia -,          Cardiovascular hypertension, Pt. on medications + Peripheral Vascular Disease     Neuro/Psych CVA    GI/Hepatic GERD-  ,  Endo/Other  diabetes  Renal/GU ESRFRenal disease     Musculoskeletal   Abdominal   Peds  Hematology   Anesthesia Other Findings   Reproductive/Obstetrics                         Anesthesia Physical Anesthesia Plan  ASA: III  Anesthesia Plan: General   Post-op Pain Management:    Induction: Intravenous  Airway Management Planned: LMA  Additional Equipment:   Intra-op Plan:   Post-operative Plan: Extubation in OR  Informed Consent: I have reviewed the patients History and Physical, chart, labs and discussed the procedure including the risks, benefits and alternatives for the proposed anesthesia with the patient or authorized representative who has indicated his/her understanding and acceptance.     Plan Discussed with: CRNA and Surgeon  Anesthesia Plan Comments:         Anesthesia Quick Evaluation

## 2012-07-16 ENCOUNTER — Inpatient Hospital Stay (HOSPITAL_COMMUNITY): Payer: Medicare Other

## 2012-07-16 DIAGNOSIS — N186 End stage renal disease: Secondary | ICD-10-CM | POA: Diagnosis not present

## 2012-07-16 DIAGNOSIS — R4182 Altered mental status, unspecified: Secondary | ICD-10-CM | POA: Diagnosis not present

## 2012-07-16 DIAGNOSIS — R509 Fever, unspecified: Secondary | ICD-10-CM | POA: Diagnosis not present

## 2012-07-16 LAB — CBC
HCT: 26.3 % — ABNORMAL LOW (ref 36.0–46.0)
Hemoglobin: 8.4 g/dL — ABNORMAL LOW (ref 12.0–15.0)
MCH: 27.4 pg (ref 26.0–34.0)
MCHC: 31.9 g/dL (ref 30.0–36.0)
RDW: 17.1 % — ABNORMAL HIGH (ref 11.5–15.5)

## 2012-07-16 LAB — GLUCOSE, CAPILLARY
Glucose-Capillary: 181 mg/dL — ABNORMAL HIGH (ref 70–99)
Glucose-Capillary: 57 mg/dL — ABNORMAL LOW (ref 70–99)
Glucose-Capillary: 185 mg/dL — ABNORMAL HIGH (ref 70–99)

## 2012-07-16 LAB — BASIC METABOLIC PANEL
BUN: 37 mg/dL — ABNORMAL HIGH (ref 6–23)
Chloride: 94 mEq/L — ABNORMAL LOW (ref 96–112)
Creatinine, Ser: 9.87 mg/dL — ABNORMAL HIGH (ref 0.50–1.10)
GFR calc Af Amer: 5 mL/min — ABNORMAL LOW (ref >90)
GFR calc non Af Amer: 4 mL/min — ABNORMAL LOW (ref >90)
Glucose, Bld: 267 mg/dL — ABNORMAL HIGH (ref 70–99)

## 2012-07-16 MED ORDER — HEPARIN SODIUM (PORCINE) 1000 UNIT/ML DIALYSIS
2000.0000 [IU] | Freq: Once | INTRAMUSCULAR | Status: DC
  Filled 2012-07-16: qty 2

## 2012-07-16 MED ORDER — NEPRO/CARBSTEADY PO LIQD: 237.0000 mL | ORAL | Status: DC | PRN

## 2012-07-16 MED ORDER — ACETAMINOPHEN 650 MG RE SUPP
650.0000 mg | RECTAL | Status: DC | PRN
  Administered 2012-07-16: 650 mg via RECTAL

## 2012-07-16 MED ORDER — HEPARIN SODIUM (PORCINE) 1000 UNIT/ML DIALYSIS
1000.0000 [IU] | INTRAMUSCULAR | Status: DC | PRN
  Filled 2012-07-16: qty 1

## 2012-07-16 MED ORDER — ALTEPLASE 2 MG IJ SOLR
2.0000 mg | Freq: Once | INTRAMUSCULAR | Status: DC | PRN
  Filled 2012-07-16: qty 2

## 2012-07-16 MED ORDER — MORPHINE SULFATE 2 MG/ML IJ SOLN: 2.0000 mg | Freq: Once | INTRAMUSCULAR | Status: AC

## 2012-07-16 MED ORDER — RENA-VITE PO TABS
1.0000 | ORAL_TABLET | Freq: Every day | ORAL | Status: DC
  Administered 2012-07-16 – 2012-07-22 (×7): 1 via ORAL
  Filled 2012-07-16 (×8): qty 1

## 2012-07-16 MED ORDER — DARBEPOETIN ALFA-POLYSORBATE 150 MCG/0.3ML IJ SOLN
150.0000 ug | INTRAMUSCULAR | Status: DC
  Filled 2012-07-16 (×2): qty 0.3

## 2012-07-16 MED ORDER — PENTAFLUOROPROP-TETRAFLUOROETH EX AERO: 1.0000 "application " | INHALATION_SPRAY | CUTANEOUS | Status: DC | PRN

## 2012-07-16 MED ORDER — VANCOMYCIN HCL 10 G IV SOLR
1500.0000 mg | Freq: Once | INTRAVENOUS | Status: AC
  Administered 2012-07-16: 1500 mg via INTRAVENOUS
  Filled 2012-07-16: qty 1500

## 2012-07-16 MED ORDER — VANCOMYCIN HCL 1000 MG IV SOLR
750.0000 mg | INTRAVENOUS | Status: DC
  Administered 2012-07-16 – 2012-07-23 (×4): 750 mg via INTRAVENOUS
  Filled 2012-07-16 (×7): qty 750

## 2012-07-16 MED ORDER — SODIUM CHLORIDE 0.9 % IV SOLN: 100.0000 mL | INTRAVENOUS | Status: DC | PRN

## 2012-07-16 MED ORDER — LIDOCAINE HCL (PF) 1 % IJ SOLN: 5.0000 mL | INTRAMUSCULAR | Status: DC | PRN

## 2012-07-16 MED ORDER — PIPERACILLIN-TAZOBACTAM IN DEX 2-0.25 GM/50ML IV SOLN
2.2500 g | Freq: Three times a day (TID) | INTRAVENOUS | Status: DC
  Administered 2012-07-16 – 2012-07-18 (×7): 2.25 g via INTRAVENOUS
  Filled 2012-07-16 (×10): qty 50

## 2012-07-16 MED ORDER — LIDOCAINE-PRILOCAINE 2.5-2.5 % EX CREA: 1.0000 "application " | TOPICAL_CREAM | CUTANEOUS | Status: DC | PRN

## 2012-07-16 MED ORDER — VANCOMYCIN HCL IN DEXTROSE 1-5 GM/200ML-% IV SOLN
1000.0000 mg | Freq: Once | INTRAVENOUS | Status: DC
  Filled 2012-07-16: qty 200

## 2012-07-16 MED ORDER — MORPHINE SULFATE 2 MG/ML IJ SOLN
INTRAMUSCULAR | Status: AC
  Administered 2012-07-16: 2 mg via INTRAVENOUS
  Filled 2012-07-16: qty 1

## 2012-07-16 MED ORDER — ACETAMINOPHEN 325 MG PO TABS
ORAL_TABLET | ORAL | Status: AC
  Filled 2012-07-16: qty 2

## 2012-07-16 MED ORDER — DARBEPOETIN ALFA-POLYSORBATE 150 MCG/0.3ML IJ SOLN
INTRAMUSCULAR | Status: AC
  Administered 2012-07-16: 150 ug via INTRAVENOUS
  Filled 2012-07-16: qty 0.3

## 2012-07-16 NOTE — Progress Notes (Signed)
OT Cancellation Note  Patient Details Name: [name] MRN: [id number] DOB: [birth year]   Cancelled Treatment:    Reason Eval/Treat Not Completed: Medical issues which prohibited therapy--new left sided weakness and pt refusing all treatment at present.  [name] 07/16/2012, 11:22 AM

## 2012-07-16 NOTE — Progress Notes (Signed)
[facility] Progress Note  Subjective:   Asleep, difficult to arouse. Unable to participate in ROS. Per staff, was refusing HD and all other treatments earlier this a.m.  Objective Filed Vitals:   07/15/12 2146 07/16/12 0613 07/16/12 0646 07/16/12 1000  BP: 165/76 171/80 163/83 156/72  Pulse: 117 121 120 111  Temp: 98.7 F (37.1 C) 102.6 F (39.2 C) 103.2 F (39.6 C) 100.2 F (37.9 C)  TempSrc: Oral Oral Oral Oral  Resp: 20 20 20 16   Height:      Weight: 72.6 kg (160 lb 0.9 oz)  70.9 kg (156 lb 4.9 oz)   SpO2: 100% 95% 96% 100%   Physical Exam General: Sleeping, extremely lethargic when aroused but NAD Heart: Tachy regular Lungs: Exam limited. Snoring, unable to follow commands Abdomen: Soft, non-distended. Normal BS Extremities: Left foot with TMA, wrapped with +Edema.  No LE edema on R Dialysis Access: Rt thigh AVG + bruit  Dialysis Orders: Center: [facility] on MWF .  EDW 67 kg Optiflux 160 HD Bath 2K/ 3.5Ca Time 3:30 Heparin 5000. Access R thigh AVG BFR 450 DFR A1.5  Hectorol 0 mcg IV/HD Epogen 18000 Units IV/HD Venofer 0  (Labs 2/26 - Tsat 35%, P 2.6, PTH <5.5)  Assessment/Plan: 1. 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 Edilia Bo. Extensive infection and purulence. Wound cultures with a few gram- rods and gram+ cocci in pairs. Results of BC's pending. IV Vanc and Zosyn dosed per pharmacy. 2. AMS- likely due to fevers, a little better this afternoon 3. ESRD - MWF @ [facility]. K+ 4.6. Next HD 3/28 4. Hypertension/volume - 156/72 on Amlodipine 10 mg qd. 4kg up by weights. Mild bilateral pleural effusions by CXR. Will try for UF goal of 3.5L  today. 5. Anemia - Hgb 8.4 < 12.2 post op.  Will start Aranesp 150 q Wednesdays HD. Monitor Hgb and transfuse prn. Last Tsat 35% on 2/26. No IV Fe for now. 6. Metabolic bone disease - Ca 10.3  Phos well controlled and PTH <5.5 s/p PTX. Renal panel pending 7. Nutrition - Last Albumin 4.0. Renal diet and multivitamin 8. DM - per  primary  Scot Jun. Broadus John, PA-C [facility] Pager [phone] 07/16/2012,11:56 AM  LOS: 1 day   Patient seen and examined.  I agree with plan as above with additions as indicated.  Confused in association with fevers. On abx, [facility] says may need further amputation if fevers don't resolve. Rec's as above. Vinson Moselle  MD [phone] pgr    [phone] cell 07/16/2012, 2:38 PM   Additional Objective Labs: Basic Metabolic Panel:  Recent Labs Lab 07/15/12 1323 07/15/12 1707 07/16/12 0947  NA 134*  --  133*  K 4.2  --  4.6  CL  --   --  94*  CO2  --   --  24  GLUCOSE 228*  --  267*  BUN  --   --  37*  CREATININE  --  7.93* 9.87*  CALCIUM  --   --  10.3   CBC:  Recent Labs Lab 07/15/12 1323 07/15/12 1707 07/16/12 0947  WBC  --  13.2* 12.9*  HGB 12.2 9.7* 8.4*  HCT 36.0 31.1* 26.3*  MCV  --  85.9 85.7  PLT  --  366 348   Blood Culture    Component Value Date/Time   SDES WOUND LEFT FOOT 07/15/2012 1408   SPECREQUEST SWAB FROM LEFT FOOT AMPUTATION SITE PT ON ZINACEF 07/15/2012 1408  CULT NO GROWTH 07/15/2012 1408   REPTSTATUS PENDING 07/15/2012 1408    CBG:  Recent Labs Lab 07/15/12 1247 07/15/12 1501 07/15/12 1625 07/15/12 2143 07/16/12 0744  GLUCAP 208* 208* 204* 273* 260*   Studies/Results: Ct Head Wo Contrast  07/16/2012  *RADIOLOGY REPORT*  Clinical Data: Altered mental status:  Febrile  CT HEAD WITHOUT CONTRAST  Technique:  Contiguous axial images were obtained from the base of the skull through the vertex without contrast.  Comparison:  July 27, 2005  Findings: There is mild diffuse atrophy which is stable.  There is no mass, hemorrhage, extra-axial fluid collection, or midline shift.  There is evidence of a prior infarct at the right parietal occipital junction.  There is patchy small vessel disease in the centra semiovale bilaterally.  No new gray-white compartment lesions are identified.  No acute infarct is apparent.  Bony calvarium appears  intact.  The mastoid air cells are clear.  IMPRESSION: Atrophy with prior right occipitoparietal junction infarct. Patchy small vessel disease in the centra semiovale bilaterally.  No evidence of mass, hemorrhage, or acute appearing infarct.   Original Report Authenticated By: Bretta Bang, M.D.    Dg Chest Port 1 View  07/15/2012  *RADIOLOGY REPORT*  Clinical Data: Hypertension.  PORTABLE CHEST - 1 VIEW  Comparison: April 21, 2012.  Findings: Cardiomediastinal silhouette appears normal.  Mild bilateral pleural effusions are noted.  Some degree of atelectasis or pneumonia in the left lung base appears to be present.  Bony thorax is intact.  IMPRESSION: Mild bilateral pleural effusions.  Probable left basilar opacity concerning for pneumonia or subsegmental atelectasis.   Original Report Authenticated By: Lupita Raider.,  M.D.    Medications:   . amLODipine  10 mg Oral Daily  . aspirin EC  81 mg Oral Daily  . calcium carbonate  2 tablet Oral TID WC  . docusate sodium  100 mg Oral Daily  . [START ON 07/17/2012] enoxaparin (LOVENOX) injection  30 mg Subcutaneous Q24H  . glipiZIDE  10 mg Oral BID AC  . insulin aspart  0-9 Units Subcutaneous TID WC  . multivitamin  1 tablet Oral QHS  . mupirocin ointment   Nasal BID  . pantoprazole  80 mg Oral Q1200  . piperacillin-tazobactam (ZOSYN)  IV  2.25 g Intravenous Q8H  . sodium chloride  3 mL Intravenous Q12H  . vancomycin  1,500 mg Intravenous Once  . vancomycin  750 mg Intravenous Q M,W,F-HD

## 2012-07-16 NOTE — Progress Notes (Signed)
Utilization review completed.  

## 2012-07-16 NOTE — Progress Notes (Addendum)
ANTIBIOTIC CONSULT NOTE - INITIAL  Pharmacy Consult for vancomycin and zosyn Indication: r/o sepsis, gangrene of foot  Allergies  Allergen Reactions  . Doxycycline Other (See Comments)    Unknown reaction  . Peroxyl (Hydrogen Peroxide) Hives and Rash    Patient Measurements: Height: 5\' 2"  (157.5 cm) Weight: 156 lb 4.9 oz (70.9 kg) IBW/kg (Calculated) : 50.1   Vital Signs: Temp: 103.2 F (39.6 C) (03/26 0646) Temp src: Oral (03/26 0646) BP: 163/83 mmHg (03/26 0646) Pulse Rate: 120 (03/26 0646) Intake/Output from previous day: 03/25 0701 - 03/26 0700 In: 504 [P.O.:254; I.V.:250] Out: -  Intake/Output from this shift:    Labs:  Recent Labs  07/15/12 1323 07/15/12 1707  WBC  --  13.2*  HGB 12.2 9.7*  PLT  --  366  CREATININE  --  7.93*   Estimated Creatinine Clearance: 7.9 ml/min (by C-G formula based on Cr of 7.93). No results found for this basename: VANCOTROUGH, Leodis Binet, VANCORANDOM, GENTTROUGH, GENTPEAK, GENTRANDOM, TOBRATROUGH, TOBRAPEAK, TOBRARND, AMIKACINPEAK, AMIKACINTROU, AMIKACIN,  in the last 72 hours   Microbiology: Recent Results (from the past 720 hour(s))  SURGICAL PCR SCREEN     Status: None   Collection Time    07/15/12 12:01 PM      Result Value Range Status   MRSA, PCR NEGATIVE  NEGATIVE Final   Staphylococcus aureus NEGATIVE  NEGATIVE Final   Comment:            The Xpert SA Assay (FDA     approved for NASAL specimens     in patients over 85 years of age),     is one component of     a comprehensive surveillance     program.  Test performance has     been validated by [employer] for patients greater     than or equal to 63 year old.     It is not intended     to diagnose infection nor to     guide or monitor treatment.  ANAEROBIC CULTURE     Status: None   Collection Time    07/15/12  2:05 PM      Result Value Range Status   Specimen Description WOUND LEFT FOOT   Final   Special Requests SWAB FROM LEFT FOOT AMPUTATION SITE  PT ON ZINACEF   Final   Gram Stain     Final   Value: FEW WBC PRESENT,BOTH PMN AND MONONUCLEAR     RARE SQUAMOUS EPITHELIAL CELLS PRESENT     FEW GRAM NEGATIVE RODS     FEW GRAM POSITIVE COCCI IN PAIRS   Culture PENDING   Incomplete   Report Status PENDING   Incomplete  WOUND CULTURE     Status: None   Collection Time    07/15/12  2:08 PM      Result Value Range Status   Specimen Description WOUND LEFT FOOT   Final   Special Requests SWAB FROM LEFT FOOT AMPUTATION SITE PT ON ZINACEF   Final   Gram Stain     Final   Value: FEW WBC PRESENT,BOTH PMN AND MONONUCLEAR     RARE SQUAMOUS EPITHELIAL CELLS PRESENT     FEW GRAM NEGATIVE RODS     FEW GRAM POSITIVE COCCI IN PAIRS   Culture NO GROWTH   Final   Report Status PENDING   Incomplete    Medical History: Past Medical History  Diagnosis Date  . ESRD (end stage renal  disease)   . Type 2 diabetes mellitus   . Hypertension   . CVA (cerebral infarction)     right parietal 05/19/2000  . Vaginal bleeding   . Hyperparathyroidism   . Pneumonia   . Stroke     2002,no residual  . Peripheral vascular disease   . GERD (gastroesophageal reflux disease)   . Anemia     Medications:  Prescriptions prior to admission  Medication Sig Dispense Refill  . Acetaminophen (TYLENOL PO) Take 1-2 tablets by mouth See admin instructions. Uses at dialysis Mon, Wed, Fridays each week for muscle spasms      . amLODipine (NORVASC) 10 MG tablet Take 10 mg by mouth daily.        Marland Kitchen aspirin 81 MG tablet Take 81 mg by mouth daily.        . calcium carbonate (TUMS - DOSED IN MG ELEMENTAL CALCIUM) 500 MG chewable tablet Chew 2 tablets by mouth 3 (three) times daily with meals.       . diazepam (VALIUM) 2 MG tablet Take 2 mg by mouth every 6 (six) hours as needed. For muscle spasms      . esomeprazole (NEXIUM) 40 MG capsule Take 40 mg by mouth daily as needed. For heartburn      . glipiZIDE (GLUCOTROL) 10 MG tablet Take 10 mg by mouth 2 (two) times daily before  a meal.       . HYDROcodone-acetaminophen (NORCO/VICODIN) 5-325 MG per tablet Take 1 tablet by mouth every 6 (six) hours as needed. For pain      . LIDOCAINE EX Apply 1 application topically See admin instructions. Use 1 hour prior to dialysis as needed for pain on Mon, Wed and Fri.      . multivitamin (RENA-VIT) TABS tablet Take 1 tablet by mouth daily.       Assessment: 52 yo lady s/p left transmetatarsal amputation to start broad spectrum antibiotics.  Goal of Therapy:  Vancomycin trough level 15-20 mcg/ml  Plan:  Zosyn 2.25 gm IV q8 hours. Vancomycin 1000 mg IV X 1 then 750 mg after each HD on MWF.  Seay, Lora Poteet 07/16/2012,7:48 AM  --------------------------------------------------------------------------------------  Addendum:  Given this patient's hx ESRD, weight, and presumed volume of distribution. Will increase loading dose slightly to Vanc 1500 mg and then continue Vancomycin maintenance doses as ordered above.  Georgina Pillion, PharmD, BCPS 07/16/2012 8:37 AM

## 2012-07-16 NOTE — Progress Notes (Signed)
PT Cancellation Note  Patient Details Name: [name] MRN: [id number] DOB: 12-30-1960   Cancelled Treatment:    Reason Eval/Treat Not Completed: Medical issues which prohibited therapy   Donnetta Hail 07/16/2012, 10:35 AM

## 2012-07-16 NOTE — Progress Notes (Signed)
Pt refusing dialysis today, also attempted to administer tylenol for fever but pt refused.  Paged nephrology but did not get a return call. Brought pt back to room until nephrologist sees pt.

## 2012-07-16 NOTE — Progress Notes (Signed)
VASCULAR PROGRESS NOTE  SUBJECTIVE: Arousable, but somewhat lethargic. Reportedly, last night, she was more with it.  PHYSICAL EXAM: Filed Vitals:   07/15/12 1627 07/15/12 2146 07/16/12 0613 07/16/12 0646  BP: 156/74 165/76 171/80 163/83  Pulse: 109 117 121 120  Temp: 99.7 F (37.6 C) 98.7 F (37.1 C) 102.6 F (39.2 C) 103.2 F (39.6 C)  TempSrc: Oral Oral Oral Oral  Resp: 16 20 20 20   Height:      Weight:  160 lb 0.9 oz (72.6 kg)  156 lb 4.9 oz (70.9 kg)  SpO2: 98% 100% 95% 96%   Her transmetatarsal amputation site looks fine. There is no drainage. However, there is an odor present.  LABS: Lab Results  Component Value Date   WBC 13.2* 07/15/2012   HGB 9.7* 07/15/2012   HCT 31.1* 07/15/2012   MCV 85.9 07/15/2012   PLT 366 07/15/2012   Lab Results  Component Value Date   CREATININE 7.93* 07/15/2012   Lab Results  Component Value Date   INR 0.9 06/15/2008   CBG (last 3)   Recent Labs  07/15/12 1501 07/15/12 1625 07/15/12 2143  GLUCAP 208* 204* 273*   Intra-Op culture: No growth so far. Gram stain pending.  ASSESSMENT AND PLAN: 1. 1 Day Post-Op s/p: left transmetatarsal amputation. He had extensive infection present. Her culture is pending. 2. ID: she is currently on Zinacef. Given that she could potentially be septic, I will change this to Vanco and Zosyn. 3. NEURO: The patient has some change in mental status with potentially left-sided weakness. I have ordered a CT scan of the head to rule out a stroke. In addition we will make her that she has no significant electrolyte abnormalities. Nephrology is following also. She may potentially need a neurology consultation if she does not improve or if her CT scan shows any significant findings. 4. Given the extent of the infection, if she has persistent fevers or evidence of sepsis, she could potentially require a more proximal amputation. 5. Diabetes: Blood sugars have been under reasonable control although her last CABG was  elevated at 273.  Cari Caraway Beeper: [phone] 07/16/2012

## 2012-07-16 NOTE — Progress Notes (Signed)
@   07:45 this morning- upon assessment patient is lethargic, oriented only to self, pupils 2mm bilateral/reactive but neglects left vision fields and does not move her left arm or leg when asked to. Pt is diaphoretic and has a fever of 103 degrees F (oral), all other VS's WNL. Dr. Ashley Royalty made aware of pt's fever and lethargy; he came to pt's room to assess patient. Dr. Edilia Bo ordered a CT of the head and Tylenol suppository 650mg  (see MAR for details). @10 :00 Pt's oral temp. Is 100.2. Pt is arousable and follows simple commands. She moves her left arm and leg and no longer neglects left vision fields.  However, she remains very fatigued.  M.D. aware of pt's status. Will continue to monitor patient closely.

## 2012-07-16 NOTE — Procedures (Signed)
I was present at this dialysis session. I have reviewed the session itself and made appropriate changes.   Vinson Moselle, MD [employer] 07/16/2012, 3:14 PM

## 2012-07-16 NOTE — Progress Notes (Signed)
0630 Paged Dr Darrick Penna to notify of elevated temp 102.6. Orders received however pt refusing all treatment at this time. Will not take Tylenol and will not sign consent for HD. 0705 Dressing changed by MD.  Pt will not move Left side, very limited conversation, will not follow commands. Dr Edilia Bo to bedside and aware of pt status. Dayshift nurse made aware of changes, Rapid Response called to pt bedside

## 2012-07-16 NOTE — Anesthesia Postprocedure Evaluation (Signed)
  Anesthesia Post-op Note  Patient: [name]  Procedure(s) Performed: Procedure(s): TRANSMETATARSAL AMPUTATION (Left)  Patient Location: PACU  Anesthesia Type:Regional  Level of Consciousness: awake, alert  and patient cooperative  Airway and Oxygen Therapy: Patient Spontanous Breathing  Post-op Pain: none  Post-op Assessment: Post-op Vital signs reviewed, Patient's Cardiovascular Status Stable, Respiratory Function Stable, Patent Airway, No signs of Nausea or vomiting and Pain level controlled  Post-op Vital Signs: stable  Complications: No apparent anesthesia complications

## 2012-07-17 ENCOUNTER — Encounter (HOSPITAL_COMMUNITY): Payer: Self-pay | Admitting: Vascular Surgery

## 2012-07-17 DIAGNOSIS — N186 End stage renal disease: Secondary | ICD-10-CM | POA: Diagnosis not present

## 2012-07-17 LAB — BASIC METABOLIC PANEL
BUN: 19 mg/dL (ref 6–23)
GFR calc Af Amer: 8 mL/min — ABNORMAL LOW (ref >90)
GFR calc non Af Amer: 7 mL/min — ABNORMAL LOW (ref >90)
Potassium: 3.9 mEq/L (ref 3.5–5.1)
Sodium: 133 mEq/L — ABNORMAL LOW (ref 135–145)

## 2012-07-17 LAB — CBC
Hemoglobin: 9 g/dL — ABNORMAL LOW (ref 12.0–15.0)
MCHC: 30.9 g/dL (ref 30.0–36.0)
Platelets: 407 10*3/uL — ABNORMAL HIGH (ref 150–400)
RBC: 3.43 MIL/uL — ABNORMAL LOW (ref 3.87–5.11)

## 2012-07-17 LAB — GLUCOSE, CAPILLARY: Glucose-Capillary: 94 mg/dL (ref 70–99)

## 2012-07-17 NOTE — Progress Notes (Signed)
VASCULAR PROGRESS NOTE  SUBJECTIVE: Back to her baseline mentally. No significant pain. Wants grits with butter.   PHYSICAL EXAM: Filed Vitals:   07/16/12 1553 07/16/12 1800 07/16/12 2234 07/17/12 0410  BP: 123/45 137/61 147/65 131/55  Pulse: 104 109 108 102  Temp: 97.7 F (36.5 C) 97.6 F (36.4 C) 100.2 F (37.9 C) 99.2 F (37.3 C)  TempSrc: Oral Oral Oral Oral  Resp: 13 16 16 16   Height:   5\' 2"  (1.575 m)   Weight:   148 lb 4.8 oz (67.268 kg)   SpO2:  94% 98% 97%   TMA looks good so far. No erythema or drainage.  No odor this AM  LABS: Lab Results  Component Value Date   WBC 12.9* 07/16/2012   HGB 8.4* 07/16/2012   HCT 26.3* 07/16/2012   MCV 85.7 07/16/2012   PLT 348 07/16/2012   Lab Results  Component Value Date   CREATININE 9.87* 07/16/2012   Lab Results  Component Value Date   INR 0.9 06/15/2008   CBG (last 3)   Recent Labs  07/16/12 1702 07/16/12 1824 07/16/12 2232  GLUCAP 57* 185* 316*   CT head was unremarkable.   Wound Culture: GS- few GNR, few GPC in pairs, no growth so far.  Blood cultures: pend  ASSESSMENT AND PLAN: 1. 2 Days Post-Op s/p: Left TMA 2. Low grade fever. Continue IV Vanco & Zosyn (day 2 IV ABX) 3. PTX: PWB left foot, heel only. OK to begin PTx 4. Renal following. Could she receive IV ABX at dialysis after she is D/C'd? 5. Anemia: no significant blood loss at surgery.  6. Disposition: home with HHPT when she "passes" PTx if they agree.   Cari Caraway Beeper: [phone] 07/17/2012

## 2012-07-17 NOTE — Evaluation (Signed)
Physical Therapy Evaluation Patient Details Name: [name] MRN: [id number] DOB: [birth year] Today's Date: 07/17/2012 Time: 1012-1028 PT Time Calculation (min): 16 min  PT Assessment / Plan / Recommendation Clinical Impression  Pt eval limited by pain. Pt not able to fully participate with some impairment in following commands.  Anticipate she will need follow up PT and 24 hour care.  Recommend SNF for short term rehab    PT Assessment  Patient needs continued PT services    Follow Up Recommendations  SNF    Does the patient have the potential to tolerate intense rehabilitation      Barriers to Discharge        Equipment Recommendations  Rolling walker with 5" wheels    Recommendations for Other Services     Frequency Min 3X/week    Precautions / Restrictions Precautions Precautions: Fall Restrictions Weight Bearing Restrictions: Yes LLE Weight Bearing: Partial weight bearing LLE Partial Weight Bearing Percentage or Pounds: heel only   Pertinent Vitals/Pain Pt c/o pain in left surgical site with dependent position      Mobility  Bed Mobility Bed Mobility: Sit to Supine Sit to Supine: 1: +2 Total assist Sit to Supine: Patient Percentage: 50% Transfers Transfers: Heritage manager Transfers: 1: +2 Total assist Squat Pivot Transfers: Patient Percentage: 50% Ambulation/Gait Ambulation/Gait Assistance: Not tested (comment) General Gait Details: pt unable to stand and walk today due to pain and decreased participation with PT    Exercises     PT Diagnosis: Difficulty walking;Acute pain  PT Problem List: Pain;Decreased activity tolerance;Decreased mobility PT Treatment Interventions: Gait training;DME instruction;Functional mobility training;Therapeutic activities;Therapeutic exercise;Patient/family education   PT Goals Acute Rehab PT Goals PT Goal Formulation: Patient unable to participate in goal setting Time For Goal Achievement:  07/31/12 Potential to Achieve Goals: Good Pt will go Supine/Side to Sit: with supervision PT Goal: Supine/Side to Sit - Progress: Goal set today Pt will go Sit to Supine/Side: with supervision PT Goal: Sit to Supine/Side - Progress: Goal set today Pt will go Sit to Stand: with supervision PT Goal: Sit to Stand - Progress: Goal set today Pt will go Stand to Sit: with supervision PT Goal: Stand to Sit - Progress: Goal set today Pt will Stand: 3 - 5 min;with modified independence;with bilateral upper extremity support PT Goal: Stand - Progress: Goal set today Pt will Ambulate: 16 - 50 feet;with min assist;with least restrictive assistive device PT Goal: Ambulate - Progress: Goal set today  Visit Information  Assistance Needed: +2    Subjective Data  Subjective: "OOOOOhhhh!  I have muscle spasms!!" Patient Stated Goal: to keep foot elevated   Prior Functioning  Home Living Lives With: Spouse Type of Home: Apartment Additional Comments: Unable to tell me, answers she did give not sure if correct, I had to repeat questions. Prior Function Comments: pt states she took the bus to dialysis. She was unable tell me if she needed assistive devices Communication Communication:  (slow to repsond) Dominant Hand: Right    Cognition  Cognition Overall Cognitive Status: Impaired Area of Impairment: Safety/judgement;Following commands;Awareness of errors;Awareness of deficits;Problem solving Arousal/Alertness: Awake/alert (seems drowsy) Orientation Level: Disoriented X4 Behavior During Session: Other (comment) (pt appears uncomfortable in the chair) Following Commands: Other (comment) (does not follow commands) Safety/Judgement: Decreased awareness of safety precautions;Impulsive;Decreased awareness of need for assistance Cognition - Other Comments: pt with limited participation with PT today    Extremity/Trunk Assessment Right Lower Extremity Assessment RLE ROM/Strength/Tone: [facility] for tasks  assessed  Left Lower Extremity Assessment LLE ROM/Strength/Tone: Deficits LLE ROM/Strength/Tone Deficits: ace wrap with bulky dressing on foot.  Pt with increased c/o pain when foot in dependent position   Balance Balance Balance Assessed: No  End of Session PT - End of Session Activity Tolerance: Patient limited by pain Patient left: in bed Nurse Communication: Mobility status  GP    Bayard Hugger. Manson Passey, [place] [phone] 07/17/2012, 10:47 AM

## 2012-07-17 NOTE — Progress Notes (Signed)
[facility] Progress Note  Subjective:   Much more alert today, back to normal  Objective Filed Vitals:   07/16/12 1800 07/16/12 2234 07/17/12 0410 07/17/12 1318  BP: 137/61 147/65 131/55 129/63  Pulse: 109 108 102 99  Temp: 97.6 F (36.4 C) 100.2 F (37.9 C) 99.2 F (37.3 C) 100 F (37.8 C)  TempSrc: Oral Oral Oral Oral  Resp: 16 16 16 18   Height:  5\' 2"  (1.575 m)    Weight:  67.268 kg (148 lb 4.8 oz)    SpO2: 94% 98% 97% 95%   Physical Exam General: awake, up in bed, talking Heart: Tachy regular Lungs: clear bilat Abdomen: Soft, non-distended, normal BS Extremities: left foot with TMA, wrapped with +edema.  No LE edema on R Dialysis Access: Rt thigh AVG + bruit  Dialysis Orders: Center: [facility] on MWF .  EDW 67 kg Optiflux 160 HD Bath 2K/ 3.5Ca Time 3:30 Heparin 5000. Access R thigh AVG BFR 450 DFR A1.5  Hectorol 0 mcg IV/HD Epogen 18000 Units IV/HD Venofer 0  (Labs 2/26 - Tsat 35%, P 2.6, PTH <5.5)  Assessment/Plan: 1. 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 Edilia Bo. Extensive infection and purulence. Wound cultures with a few gram- rods and gram+ cocci in pairs. Results of BC's pending. IV Vanc and Zosyn dosed per pharmacy. 2. AMS- due to fevers / anesthesia, resolved 3. ESRD, cont mwf HD 4. Hypertension/volume - 156/72 on Amlodipine 10/d. Just at or 1 kg over dry wt.  UF same with HD Fri. 5. Anemia - Hgb 8.4 < 12.2 post op.  Will start Aranesp 150 q Wednesdays HD. Monitor Hgb and transfuse prn. Last Tsat 35% on 2/26. No IV Fe for now. 6. Metabolic bone disease - Ca 10.3  Phos well controlled and PTH <5.5 s/p PTX. Renal panel pending 7. Nutrition - Last Albumin 4.0. Renal diet and multivitamin 8. DM - per primary  Elizabeth Moselle  MD [phone] pgr    [phone] cell 07/17/2012, 2:53 PM   CBC:  Recent Labs Lab 07/15/12 1707 07/16/12 0947 07/17/12 0640  WBC 13.2* 12.9* 14.5*  HGB 9.7* 8.4* 9.0*  HCT 31.1* 26.3* 29.1*  MCV 85.9 85.7 84.8  PLT  366 348 407*   Blood Culture    Component Value Date/Time   SDES BLOOD RIGHT HAND 07/16/2012 0950   SPECREQUEST BOTTLES DRAWN AEROBIC ONLY 10CC 07/16/2012 0950   CULT        BLOOD CULTURE RECEIVED NO GROWTH TO DATE CULTURE WILL BE HELD FOR 5 DAYS BEFORE ISSUING A FINAL NEGATIVE REPORT 07/16/2012 0950   REPTSTATUS PENDING 07/16/2012 0950    CBG:  Recent Labs Lab 07/16/12 1702 07/16/12 1824 07/16/12 2232 07/17/12 0732 07/17/12 1133  GLUCAP 57* 185* 316* 188* 374*   Studies/Results: Ct Head Wo Contrast  07/16/2012  *RADIOLOGY REPORT*  Clinical Data: Altered mental status:  Febrile  CT HEAD WITHOUT CONTRAST  Technique:  Contiguous axial images were obtained from the base of the skull through the vertex without contrast.  Comparison:  July 27, 2005  Findings: There is mild diffuse atrophy which is stable.  There is no mass, hemorrhage, extra-axial fluid collection, or midline shift.  There is evidence of a prior infarct at the right parietal occipital junction.  There is patchy small vessel disease in the centra semiovale bilaterally.  No new gray-white compartment lesions are identified.  No acute infarct is apparent.  Bony calvarium appears intact.  The mastoid air cells  are clear.  IMPRESSION: Atrophy with prior right occipitoparietal junction infarct. Patchy small vessel disease in the centra semiovale bilaterally.  No evidence of mass, hemorrhage, or acute appearing infarct.   Original Report Authenticated By: Bretta Bang, M.D.    Medications:   . amLODipine  10 mg Oral Daily  . aspirin EC  81 mg Oral Daily  . calcium carbonate  2 tablet Oral TID WC  . darbepoetin (ARANESP) injection - DIALYSIS  150 mcg Intravenous Q Wed-HD  . docusate sodium  100 mg Oral Daily  . enoxaparin (LOVENOX) injection  30 mg Subcutaneous Q24H  . glipiZIDE  10 mg Oral BID AC  . insulin aspart  0-9 Units Subcutaneous TID WC  . multivitamin  1 tablet Oral QHS  . mupirocin ointment   Nasal BID  .  pantoprazole  80 mg Oral Q1200  . piperacillin-tazobactam (ZOSYN)  IV  2.25 g Intravenous Q8H  . sodium chloride  3 mL Intravenous Q12H  . vancomycin  750 mg Intravenous Q M,W,F-HD

## 2012-07-17 NOTE — Progress Notes (Signed)
3/27  Patient may need a small dose of basal insulin daily if CBGs continue greater than 180 mg/dl. Smith Mince RN BSN CDE

## 2012-07-17 NOTE — Progress Notes (Signed)
Meet-and-greet with pt. Pt's brother was present during my visit. Pt appeared content and welcomed me into her room. Pt stated that she was content with her stay in the hospital. Pt discussed her food preferences and shared how she deals with food carvings since she has been on a special diet. Pt discussed her most recent procedure involving the amputation of her left foot. Pt indicated that she would like for me to check in on her next week.   Sherol Dade Counselor Intern Haroldine Laws

## 2012-07-17 NOTE — Evaluation (Signed)
Occupational Therapy Evaluation Patient Details Name: [name] MRN: [id number] DOB: [birth year] Today's Date: 07/17/2012 Time: 4696-2952 OT Time Calculation (min): 28 min  OT Assessment / Plan / Recommendation Clinical Impression  This 52 yo s/p transmaratarsal amputation of left foot presents to acute OT witih problems below. Will benefit from acute OT with follow up at SNF.    OT Assessment  Patient needs continued OT Services    Follow Up Recommendations  SNF    Barriers to Discharge Decreased caregiver support    Equipment Recommendations  None recommended by OT       Frequency  Min 2X/week    Precautions / Restrictions Precautions Precautions: Fall Restrictions Weight Bearing Restrictions: Yes LLE Weight Bearing: Partial weight bearing LLE Partial Weight Bearing Percentage or Pounds: heel only   Pertinent Vitals/Pain Pain increased with dependent position   ADL  Eating/Feeding: Supervision/safety;Set up;Simulated Where Assessed - Eating/Feeding: Chair Grooming: Minimal assistance;Simulated Where Assessed - Grooming: Supported sitting Upper Body Bathing: Simulated;Minimal assistance Where Assessed - Upper Body Bathing: Supported sitting Lower Body Bathing: Simulated;Maximal assistance Where Assessed - Lower Body Bathing: Supported sit to stand Upper Body Dressing: Simulated;Moderate assistance Where Assessed - Upper Body Dressing: Supported sitting Lower Body Dressing: Simulated;+1 Total assistance Where Assessed - Lower Body Dressing: Supported sit to Pharmacist, hospital: Electronics engineer Method: Ambulance person:  (bed going to patient's right to recliner) Toileting - Clothing Manipulation and Hygiene: Simulated;+1 Total assistance Where Assessed - Glass blower/designer Manipulation and Hygiene: Standing Equipment Used: Gait belt Transfers/Ambulation Related to ADLs: Max A squat pivot going to pt's good leg     OT Diagnosis: Acute pain;Cognitive deficits  OT Problem List: Decreased strength;Decreased activity tolerance;Decreased cognition;Decreased safety awareness;Impaired balance (sitting and/or standing);Decreased knowledge of precautions;Pain;Decreased knowledge of use of DME or AE OT Treatment Interventions: Self-care/ADL training;Therapeutic activities;Cognitive remediation/compensation;DME and/or AE instruction;Patient/family education;Balance training   OT Goals Acute Rehab OT Goals OT Goal Formulation: With patient Time For Goal Achievement: 07/31/12 Potential to Achieve Goals: Good ADL Goals Pt Will Perform Grooming: with set-up;with supervision;Unsupported;Sitting, edge of bed ADL Goal: Grooming - Progress: Goal set today Pt Will Transfer to Toilet: with min assist;Squat pivot transfer;3-in-1;Maintaining weight bearing status ADL Goal: Toilet Transfer - Progress: Goal set today Pt Will Perform Toileting - Hygiene: with supervision;Leaning right and/or left on 3-in-1/toilet ADL Goal: Toileting - Hygiene - Progress: Goal set today Miscellaneous OT Goals Miscellaneous OT Goal #1: Pt will follow one step commands 50% of the time. OT Goal: Miscellaneous Goal #1 - Progress: Goal set today  Visit Information  Last OT Received On: 07/17/12 Assistance Needed: +2    Subjective Data  Subjective: "I can't walk", "It hurts when I put my foot down"   Prior Functioning     Home Living Additional Comments: Unable to tell me, answers she did give not sure if correct, I had to repeat questions. Dominant Hand: Right         Vision/Perception Vision - History Baseline Vision: No visual deficits   Cognition  Cognition Overall Cognitive Status: Impaired Area of Impairment: Safety/judgement;Following commands;Awareness of errors;Awareness of deficits Arousal/Alertness: Awake/alert Orientation Level: Disoriented to;Place;Time;Situation Behavior During Session: Restless Following  Commands:  (Not following commands) Safety/Judgement: Decreased awareness of safety precautions;Decreased awareness of need for assistance;Impulsive Awareness of Errors: Assistance required to identify errors made;Assistance required to correct errors made Cognition - Other Comments: Pt not wanting to participate after having put her LLE over the side of the bed (due to  throbbing from foot now that it is in a dependent position    Extremity/Trunk Assessment Right Upper Extremity Assessment RUE ROM/Strength/Tone: Within functional levels Left Upper Extremity Assessment LUE ROM/Strength/Tone: Within functional levels     Mobility Bed Mobility Bed Mobility: Supine to Sit;Sitting - Scoot to Edge of Bed Supine to Sit: 1: +1 Total assist;HOB elevated Sitting - Scoot to Edge of Bed: 1: +1 Total assist Details for Bed Mobility Assistance: Pt layed herself back down by herself x2 while trying to get her to sit up on EOB with me. Transfers Transfers: Sit to Stand;Stand to Sit Sit to Stand: 2: Max assist;Without upper extremity assist;From bed Stand to Sit: 2: Max assist;Without upper extremity assist;To chair/3-in-1           End of Session OT - End of Session Equipment Utilized During Treatment: Gait belt Activity Tolerance: Patient limited by pain Patient left: in chair;with chair alarm set;with call bell/phone within reach Nurse Communication: Mobility status (+2 A)    [name] [phone] 07/17/2012, 4:05 PM

## 2012-07-18 DIAGNOSIS — N186 End stage renal disease: Secondary | ICD-10-CM | POA: Diagnosis not present

## 2012-07-18 LAB — WOUND CULTURE: Culture: NO GROWTH

## 2012-07-18 LAB — GLUCOSE, CAPILLARY
Glucose-Capillary: 332 mg/dL — ABNORMAL HIGH (ref 70–99)
Glucose-Capillary: 386 mg/dL — ABNORMAL HIGH (ref 70–99)

## 2012-07-18 MED ORDER — CEFTAZIDIME 2 G IJ SOLR
2.0000 g | INTRAMUSCULAR | Status: DC
  Filled 2012-07-18: qty 2

## 2012-07-18 MED ORDER — ALTEPLASE 2 MG IJ SOLR: 2.0000 mg | Freq: Once | INTRAMUSCULAR | Status: DC | PRN

## 2012-07-18 MED ORDER — LIDOCAINE HCL (PF) 1 % IJ SOLN: 5.0000 mL | INTRAMUSCULAR | Status: DC | PRN

## 2012-07-18 MED ORDER — PENTAFLUOROPROP-TETRAFLUOROETH EX AERO: 1.0000 "application " | INHALATION_SPRAY | CUTANEOUS | Status: DC | PRN

## 2012-07-18 MED ORDER — HEPARIN SODIUM (PORCINE) 1000 UNIT/ML DIALYSIS: 1000.0000 [IU] | INTRAMUSCULAR | Status: DC | PRN

## 2012-07-18 MED ORDER — DIAZEPAM 5 MG PO TABS
ORAL_TABLET | ORAL | Status: AC
  Administered 2012-07-18: 2 mg via ORAL
  Filled 2012-07-18: qty 1

## 2012-07-18 MED ORDER — MORPHINE SULFATE 2 MG/ML IJ SOLN
INTRAMUSCULAR | Status: AC
  Administered 2012-07-18: 2 mg via INTRAVENOUS
  Filled 2012-07-18: qty 1

## 2012-07-18 MED ORDER — NEPRO/CARBSTEADY PO LIQD
237.0000 mL | ORAL | Status: DC | PRN
  Filled 2012-07-18: qty 237

## 2012-07-18 MED ORDER — SODIUM CHLORIDE 0.9 % IV SOLN: 100.0000 mL | INTRAVENOUS | Status: DC | PRN

## 2012-07-18 MED ORDER — HEPARIN SODIUM (PORCINE) 1000 UNIT/ML DIALYSIS: 2000.0000 [IU] | Freq: Once | INTRAMUSCULAR | Status: DC

## 2012-07-18 MED ORDER — LIDOCAINE-PRILOCAINE 2.5-2.5 % EX CREA
1.0000 "application " | TOPICAL_CREAM | CUTANEOUS | Status: DC | PRN
  Filled 2012-07-18: qty 5

## 2012-07-18 MED ORDER — DEXTROSE 5 % IV SOLN
2.0000 g | INTRAVENOUS | Status: DC
  Administered 2012-07-18 – 2012-07-21 (×2): 2 g via INTRAVENOUS
  Filled 2012-07-18 (×3): qty 2

## 2012-07-18 NOTE — Progress Notes (Signed)
VASCULAR PROGRESS NOTE  SUBJECTIVE: No complaints. Alert and appropriate.  PHYSICAL EXAM: Filed Vitals:   07/17/12 0410 07/17/12 1318 07/17/12 2148 07/18/12 0632  BP: 131/55 129/63 115/57 118/89  Pulse: 102 99 93 102  Temp: 99.2 F (37.3 C) 100 F (37.8 C) 99.7 F (37.6 C) 99.8 F (37.7 C)  TempSrc: Oral Oral Oral Oral  Resp: 16 18 17 18   Height:      Weight:   153 lb 1.6 oz (69.446 kg)   SpO2: 97% 95% 98% 100%   Left transmetatarsal amputation site inspected. So far looks good. No erythema or drainage.  LABS: Lab Results  Component Value Date   WBC 14.5* 07/17/2012   HGB 9.0* 07/17/2012   HCT 29.1* 07/17/2012   MCV 84.8 07/17/2012   PLT 407* 07/17/2012   Lab Results  Component Value Date   CREATININE 6.38* 07/17/2012   Lab Results  Component Value Date   INR 0.9 06/15/2008   CBG (last 3)   Recent Labs  07/17/12 1133 07/17/12 1731 07/17/12 2144  GLUCAP 374* 94 134*   ASSESSMENT AND PLAN: 1. 3 Days Post-Op s/p: left TMA. So far healing adequately. She does have a low grade fever and mildly elevated WBC count which is most likely related to her foot. Only option, short of BKA is to continue IV Vanco and Zosyn. When she is D/C'd she will need IV ABx at the time of HD.  2. PTx has recommended, SNF.  3. Anemia: improved. 4. Diabetes under good control 5. Disposition: to SNF once bed available.    Cari Caraway Beeper: [phone] 07/18/2012

## 2012-07-18 NOTE — Progress Notes (Signed)
ANTIBIOTIC CONSULT NOTE - Follow-up  Pharmacy Consult for vancomycin  (Zosyn d/c'd changed to Ceftaz) Indication: r/o sepsis, gangrene of foot; s/p L transmetatarsal amputation   Allergies  Allergen Reactions  . Doxycycline Other (See Comments)    Unknown reaction  . Peroxyl (Hydrogen Peroxide) Hives and Rash    Patient Measurements: Height: 5\' 2"  (157.5 cm) Weight: 150 lb 5.7 oz (68.2 kg) IBW/kg (Calculated) : 50.1   Vital Signs: Temp: 98.8 F (37.1 C) (03/28 1239) Temp src: Oral (03/28 1239) BP: 147/71 mmHg (03/28 1239) Pulse Rate: 105 (03/28 1239) Intake/Output from previous day: 03/27 0701 - 03/28 0700 In: 953 [P.O.:600; I.V.:3; IV Piggyback:350] Out: -  Intake/Output from this shift: Total I/O In: -  Out: 1700 [Other:1700]  Labs:  Recent Labs  07/15/12 1707 07/16/12 0947 07/17/12 0640  WBC 13.2* 12.9* 14.5*  HGB 9.7* 8.4* 9.0*  PLT 366 348 407*  CREATININE 7.93* 9.87* 6.38*   Estimated Creatinine Clearance: 9.6 ml/min (by C-G formula based on Cr of 6.38). No results found for this basename: VANCOTROUGH, Leodis Binet, VANCORANDOM, GENTTROUGH, GENTPEAK, GENTRANDOM, TOBRATROUGH, TOBRAPEAK, TOBRARND, AMIKACINPEAK, AMIKACINTROU, AMIKACIN,  in the last 72 hours   Microbiology: Recent Results (from the past 720 hour(s))  SURGICAL PCR SCREEN     Status: None   Collection Time    07/15/12 12:01 PM      Result Value Range Status   MRSA, PCR NEGATIVE  NEGATIVE Final   Staphylococcus aureus NEGATIVE  NEGATIVE Final   Comment:            The Xpert SA Assay (FDA     approved for NASAL specimens     in patients over 72 years of age),     is one component of     a comprehensive surveillance     program.  Test performance has     been validated by [employer] for patients greater     than or equal to 76 year old.     It is not intended     to diagnose infection nor to     guide or monitor treatment.  ANAEROBIC CULTURE     Status: None   Collection Time   07/15/12  2:05 PM      Result Value Range Status   Specimen Description WOUND LEFT FOOT   Final   Special Requests SWAB FROM LEFT FOOT AMPUTATION SITE PT ON ZINACEF   Final   Gram Stain     Final   Value: FEW WBC PRESENT,BOTH PMN AND MONONUCLEAR     RARE SQUAMOUS EPITHELIAL CELLS PRESENT     FEW GRAM NEGATIVE RODS     FEW GRAM POSITIVE COCCI IN PAIRS   Culture     Final   Value: NO ANAEROBES ISOLATED; CULTURE IN PROGRESS FOR 5 DAYS   Report Status PENDING   Incomplete  WOUND CULTURE     Status: None   Collection Time    07/15/12  2:08 PM      Result Value Range Status   Specimen Description WOUND LEFT FOOT   Final   Special Requests SWAB FROM LEFT FOOT AMPUTATION SITE PT ON ZINACEF   Final   Gram Stain     Final   Value: FEW WBC PRESENT,BOTH PMN AND MONONUCLEAR     RARE SQUAMOUS EPITHELIAL CELLS PRESENT     FEW GRAM NEGATIVE RODS     FEW GRAM POSITIVE COCCI IN PAIRS   Culture NO GROWTH  3 DAYS   Final   Report Status 07/18/2012 FINAL   Final  CULTURE, BLOOD (ROUTINE X 2)     Status: None   Collection Time    07/16/12  9:40 AM      Result Value Range Status   Specimen Description BLOOD RIGHT HAND   Final   Special Requests BOTTLES DRAWN AEROBIC ONLY 6CC   Final   Culture  Setup Time 07/16/2012 15:37   Final   Culture     Final   Value:        BLOOD CULTURE RECEIVED NO GROWTH TO DATE CULTURE WILL BE HELD FOR 5 DAYS BEFORE ISSUING A FINAL NEGATIVE REPORT   Report Status PENDING   Incomplete  CULTURE, BLOOD (ROUTINE X 2)     Status: None   Collection Time    07/16/12  9:50 AM      Result Value Range Status   Specimen Description BLOOD RIGHT HAND   Final   Special Requests BOTTLES DRAWN AEROBIC ONLY 10CC   Final   Culture  Setup Time 07/16/2012 15:37   Final   Culture     Final   Value:        BLOOD CULTURE RECEIVED NO GROWTH TO DATE CULTURE WILL BE HELD FOR 5 DAYS BEFORE ISSUING A FINAL NEGATIVE REPORT   Report Status PENDING   Incomplete    Medical History: Past Medical  History  Diagnosis Date  . ESRD (end stage renal disease)   . Type 2 diabetes mellitus   . Hypertension   . CVA (cerebral infarction)     right parietal 05/19/2000  . Vaginal bleeding   . Hyperparathyroidism   . Pneumonia   . Stroke     2002,no residual  . Peripheral vascular disease   . GERD (gastroesophageal reflux disease)   . Anemia     Medications:  Prescriptions prior to admission  Medication Sig Dispense Refill  . Acetaminophen (TYLENOL PO) Take 1-2 tablets by mouth See admin instructions. Uses at dialysis Mon, Wed, Fridays each week for muscle spasms      . amLODipine (NORVASC) 10 MG tablet Take 10 mg by mouth daily.        Marland Kitchen aspirin 81 MG tablet Take 81 mg by mouth daily.        . calcium carbonate (TUMS - DOSED IN MG ELEMENTAL CALCIUM) 500 MG chewable tablet Chew 2 tablets by mouth 3 (three) times daily with meals.       . diazepam (VALIUM) 2 MG tablet Take 2 mg by mouth every 6 (six) hours as needed. For muscle spasms      . esomeprazole (NEXIUM) 40 MG capsule Take 40 mg by mouth daily as needed. For heartburn      . glipiZIDE (GLUCOTROL) 10 MG tablet Take 10 mg by mouth 2 (two) times daily before a meal.       . HYDROcodone-acetaminophen (NORCO/VICODIN) 5-325 MG per tablet Take 1 tablet by mouth every 6 (six) hours as needed. For pain      . LIDOCAINE EX Apply 1 application topically See admin instructions. Use 1 hour prior to dialysis as needed for pain on Mon, Wed and Fri.      . multivitamin (RENA-VIT) TABS tablet Take 1 tablet by mouth daily.        Assessment: 52 yo lady s/p left transmetatarsal amputation on vancomycin 750 mg q HD.  MD changed Zosyn to [place] today.  Vancomycin dose given appropriately today after  HD, and it appears patient tolerated full session.  Cultures pending, afebrile today.  WBC still mildly elevated.  Goal of Therapy:  Vancomycin trough level 15-20 mcg/ml  Plan:  Zosyn 2.25 gm IV q8 hours. Continue vancomycin 750 mg after each HD on  MWF.  Tad Moore, BCPS  Clinical Pharmacist Pager [phone]  07/18/2012 1:51 PM

## 2012-07-18 NOTE — Procedures (Signed)
I was present at this dialysis session. I have reviewed the session itself and made appropriate changes.   Vinson Moselle, MD [employer] 07/18/2012, 8:47 AM

## 2012-07-18 NOTE — Progress Notes (Signed)
[facility] Progress Note  Subjective:   On HD. A little drowsy s/p valium for LE muscle spasm but no other complaints  Objective Filed Vitals:   07/18/12 0716 07/18/12 0734 07/18/12 0800 07/18/12 0830  BP: 127/62 129/68 115/71 99/60  Pulse: 96 93 98 93  Temp: 98.8 F (37.1 C)     TempSrc: Oral     Resp: 17     Height:      Weight: 69.9 kg (154 lb 1.6 oz)     SpO2: 97%      Physical Exam General: Drowsy, NAD Heart: RRR Lungs: CTA bilaterally, poor effort Abdomen: Soft, non-tender, normal BS Extremities: left foot TMA wrapped. No edema Dialysis Access: Rt thigh AVG cannulated  Dialysis Orders: Center: [facility] on MWF .  EDW 67 kg Optiflux 160 HD Bath 2K/ 3.5Ca Time 3:30 Heparin 5000. Access R thigh AVG BFR 450 DFR A1.5  Hectorol 0 mcg IV/HD Epogen 18000 Units IV/HD Venofer 0  (Labs 2/26 - Tsat 35%, P 2.6, PTH <5.5)  Assessment/Plan: 1. 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 Edilia Bo. Extensive infection and purulence. Wound cultures with a few gram- rods and gram+ cocci in pairs. BC's neg thus far. IV Vanc and Zosyn dosed per pharmacy. Will switch to [place] in anticipation of abx continuing after discharge. 2. AMS- due to fevers / anesthesia, resolved 3. ESRD, cont mwf HD. K 3.9 - On 4K+ bath 4. Hypertension/volume - 99/60 on Amlodipine 10/d. UF goal 2.5 today 5. Anemia - Hgb 9.0 < 12.2 post op. Continue Aranesp 150 q Wednesdays HD. Monitor Hgb and transfuse prn. Last Tsat 35% on 2/26. No IV Fe for now. 6. Metabolic bone disease - Ca 11.4. Phos well controlled and PTH <5.5 s/p PTX. Renal panel pending. Needed 4K+ bath  which unfortunately only comes with 2.25Ca.  Will monitor labs and use lowest Ca bath possible.  7. Nutrition - Last Albumin 4.0. Renal diet and multivitamin 8. DM - per primary   Scot Jun. Thad Ranger [facility] Pager [phone] 07/18/2012,9:10 AM  LOS: 3 days   Patient seen and examined.  Agree with assessment and  plan as above.  Looks much better, will switch Zosyn to [employer] per surg request to get ready for outpatient abx at HD. Vinson Moselle  MD [phone] pgr    [phone] cell 07/18/2012, 10:49 AM    Additional Objective Labs: Basic Metabolic Panel:  Recent Labs Lab 07/15/12 1323 07/15/12 1707 07/16/12 0947 07/17/12 0640  NA 134*  --  133* 133*  K 4.2  --  4.6 3.9  CL  --   --  94* 93*  CO2  --   --  24 27  GLUCOSE 228*  --  267* 199*  BUN  --   --  37* 19  CREATININE  --  7.93* 9.87* 6.38*  CALCIUM  --   --  10.3 11.4*   CBC:  Recent Labs Lab 07/15/12 1707 07/16/12 0947 07/17/12 0640  WBC 13.2* 12.9* 14.5*  HGB 9.7* 8.4* 9.0*  HCT 31.1* 26.3* 29.1*  MCV 85.9 85.7 84.8  PLT 366 348 407*   Blood Culture    Component Value Date/Time   SDES BLOOD RIGHT HAND 07/16/2012 0950   SPECREQUEST BOTTLES DRAWN AEROBIC ONLY 10CC 07/16/2012 0950   CULT        BLOOD CULTURE RECEIVED NO GROWTH TO DATE CULTURE WILL BE HELD FOR 5 DAYS BEFORE ISSUING A FINAL NEGATIVE  REPORT 07/16/2012 0950   REPTSTATUS PENDING 07/16/2012 0950    CBG:  Recent Labs Lab 07/16/12 2232 07/17/12 0732 07/17/12 1133 07/17/12 1731 07/17/12 2144  GLUCAP 316* 188* 374* 94 134*   Studies/Results: No results found. Medications:   . amLODipine  10 mg Oral Daily  . aspirin EC  81 mg Oral Daily  . calcium carbonate  2 tablet Oral TID WC  . cefTAZidime  2 g Intramuscular Q M,W,F-HD  . darbepoetin (ARANESP) injection - DIALYSIS  150 mcg Intravenous Q Wed-HD  . docusate sodium  100 mg Oral Daily  . enoxaparin (LOVENOX) injection  30 mg Subcutaneous Q24H  . glipiZIDE  10 mg Oral BID AC  . [START ON 07/19/2012] heparin  2,000 Units Dialysis Once in dialysis  . insulin aspart  0-9 Units Subcutaneous TID WC  . multivitamin  1 tablet Oral QHS  . mupirocin ointment   Nasal BID  . pantoprazole  80 mg Oral Q1200  . sodium chloride  3 mL Intravenous Q12H  . vancomycin  750 mg Intravenous Q M,W,F-HD

## 2012-07-18 NOTE — Progress Notes (Signed)
Physical Therapy Treatment Patient Details Name: [name] MRN: [id number] DOB: [birth year] Today's Date: 07/18/2012 Time: 4098-1191 PT Time Calculation (min): 23 min  PT Assessment / Plan / Recommendation Comments on Treatment Session  Pt s/p lt transmet amp.  Pt not putting any weight on lt foot at this time.  Pt did well with transfer but her cognition is limiting her.      Follow Up Recommendations  SNF     Does the patient have the potential to tolerate intense rehabilitation     Barriers to Discharge        Equipment Recommendations  Rolling walker with 5" wheels    Recommendations for Other Services    Frequency Min 3X/week   Plan Discharge plan remains appropriate;Frequency remains appropriate    Precautions / Restrictions Precautions Precautions: Fall Required Braces or Orthoses: Other Brace/Splint Other Brace/Splint: Has post-op shoe but refused to wear since she wouldn't put any weight on her foot. Restrictions LLE Weight Bearing: Partial weight bearing LLE Partial Weight Bearing Percentage or Pounds: heel only   Pertinent Vitals/Pain Pt asked for pain meds for lt foot.  Relayed request to nursing.    Mobility  Bed Mobility Supine to Sit: 6: Modified independent (Device/Increase time);HOB elevated Sitting - Scoot to Edge of Bed: 6: Modified independent (Device/Increase time) Transfers Transfers: Stand Pivot Transfers Sit to Stand: 4: Min guard;With upper extremity assist;From bed;With armrests;From chair/3-in-1 Stand to Sit: 4: Min guard;With upper extremity assist;With armrests;To chair/3-in-1 Stand Pivot Transfers: 4: Min guard Details for Transfer Assistance: Used walker to perform stand pivot with pt hopping on rt foot. Ambulation/Gait Ambulation/Gait Assistance: Not tested (comment) (Limited due to difficulty with pt focusing on task.)    Exercises     PT Diagnosis:    PT Problem List:   PT Treatment Interventions:     PT Goals Acute Rehab PT  Goals PT Goal: Supine/Side to Sit - Progress: Met PT Goal: Sit to Stand - Progress: Progressing toward goal PT Goal: Stand to Sit - Progress: Progressing toward goal PT Goal: Stand - Progress: Progressing toward goal PT Goal: Ambulate - Progress: Progressing toward goal  Visit Information  Last PT Received On: 07/18/12 Assistance Needed: +1    Subjective Data  Subjective: "I do this by myself at home," pt stated several times when being assisted with mobility. Explained that she didn't have lines/tubes and transmet amp at home.   Cognition  Cognition Overall Cognitive Status: Impaired Area of Impairment: Safety/judgement;Following commands;Awareness of errors;Awareness of deficits Arousal/Alertness: Awake/alert Safety/Judgement: Decreased awareness of safety precautions;Decreased awareness of need for assistance;Impulsive Cognition - Other Comments: Pt seems to have decr reasoning and decr logical thought processess.  Keeps focusing on she could do everything on her own prior to surgery but doesn't take into account lines/tubes and recent surgery.    Balance  Balance Balance Assessed: Yes Static Standing Balance Static Standing - Balance Support: Bilateral upper extremity supported Static Standing - Level of Assistance: 4: Min assist  End of Session PT - End of Session Equipment Utilized During Treatment: Gait belt Activity Tolerance: Other (comment) (limited by cognition) Patient left: with nursing in room;Other (comment) (on Navos) Nurse Communication: Mobility status   GP     [facility] 07/18/2012, 3:43 PM  [facility] PT [phone]

## 2012-07-19 DIAGNOSIS — N186 End stage renal disease: Secondary | ICD-10-CM | POA: Diagnosis not present

## 2012-07-19 LAB — GLUCOSE, CAPILLARY: Glucose-Capillary: 346 mg/dL — ABNORMAL HIGH (ref 70–99)

## 2012-07-19 NOTE — Progress Notes (Signed)
Pt's CBG 346. Pt still refusing insulin. Educated her on why she needs insulin during her time in the hospital (i.e. Sugars being higher at this time due to infection and surgery) and encourage pt to take insulin to help regulated sugar, yet pt still refused insulin. Dr Imogene Burn notified of situation. No new orders received. Will continue to monitor. [name], Rosanna Randy

## 2012-07-19 NOTE — Progress Notes (Signed)
[facility] Progress Note  Subjective:   Eating what she likes. Not sure of discharge plan  Objective Filed Vitals:   07/18/12 1239 07/18/12 1757 07/18/12 2004 07/19/12 0416  BP: 147/71 145/68 118/75 116/61  Pulse: 105 102  91  Temp: 98.8 F (37.1 C) 98.6 F (37 C) 99 F (37.2 C) 98.8 F (37.1 C)  TempSrc: Oral Oral Core (Comment) Oral  Resp: 20 18 18 18   Height:      Weight:      SpO2: 98% 98% 98% 100%   Physical Exam General: alert and oriented Heart: RRR Lungs: no wheezes or rales Abdomen: obese soft Extremities: no significant edema left transmet redressed today. Dialysis Access: Rt thigh AVG + bruit  Dialysis Orders: Center: [facility] on MWF .  EDW 67 kg Optiflux 160 HD Bath 2K/ 3.5Ca Time 3:30 Heparin 5000. Access R thigh AVG BFR 450 DFR A1.5  Hectorol 0 mcg IV/HD Epogen 18000 Units IV/HD Venofer 0  (Labs 2/26 - Tsat 35%, P 2.6, PTH <5.5)   Assessment/Plan:  1. 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 Edilia Bo. BC's neg thus far. Vanc/Zosyn changed to [place] in anticipation of abx at HD continuing after discharge. 2. AMS- due to fevers / anesthesia, resolved 3. ESRD, cont mwf HD. K 3.9 - On 4K+ bath; post HD weight 68.2 with UF 2.6 Friday. 4. Hypertension/volume - 99/60 on Amlodipine 10/d.  5. Anemia - Hgb 9.0 < 12.2 post op. Continue Aranesp 150 q Wednesdays HD. Monitor Hgb and transfuse prn. Last Tsat 35% on 2/26. No IV Fe for now. 6. Metabolic bone disease - Ca 11.4. 3/27 and PTH <5.5 s/p PTX. - on added Ca bath prior to admission; need to change at discharge 7. Nutrition - . Renal diet/nepro and multivitamin 8. DM - BS elevated; on glipizide 10 bid; pt resistant to SSI coverage  Sheffield Slider, PA-C [facility] Beeper [phone] 07/19/2012,9:46 AM  LOS: 4 days   Patient seen and examined.  Agree with assessment and plan as above. Vinson Moselle  MD [phone] pgr    [phone] cell 07/19/2012, 12:59 PM   Additional  Objective Labs: Basic Metabolic Panel:  Recent Labs Lab 07/15/12 1323 07/15/12 1707 07/16/12 0947 07/17/12 0640  NA 134*  --  133* 133*  K 4.2  --  4.6 3.9  CL  --   --  94* 93*  CO2  --   --  24 27  GLUCOSE 228*  --  267* 199*  BUN  --   --  37* 19  CREATININE  --  7.93* 9.87* 6.38*  CALCIUM  --   --  10.3 11.4*   LCBC:  Recent Labs Lab 07/15/12 1707 07/16/12 0947 07/17/12 0640  WBC 13.2* 12.9* 14.5*  HGB 9.7* 8.4* 9.0*  HCT 31.1* 26.3* 29.1*  MCV 85.9 85.7 84.8  PLT 366 348 407*   CBG:  Recent Labs Lab 07/17/12 2144 07/18/12 1253 07/18/12 1645 07/18/12 2135 07/19/12 0753  GLUCAP 134* 120* 332* 386* 210*  Medications:   . amLODipine  10 mg Oral Daily  . aspirin EC  81 mg Oral Daily  . calcium carbonate  2 tablet Oral TID WC  . cefTAZidime (FORTAZ)  IV  2 g Intravenous Q M,W,F-HD  . darbepoetin (ARANESP) injection - DIALYSIS  150 mcg Intravenous Q Wed-HD  . docusate sodium  100 mg Oral Daily  . enoxaparin (LOVENOX) injection  30 mg Subcutaneous Q24H  .  glipiZIDE  10 mg Oral BID AC  . insulin aspart  0-9 Units Subcutaneous TID WC  . multivitamin  1 tablet Oral QHS  . mupirocin ointment   Nasal BID  . pantoprazole  80 mg Oral Q1200  . sodium chloride  3 mL Intravenous Q12H  . vancomycin  750 mg Intravenous Q M,W,F-HD

## 2012-07-19 NOTE — Progress Notes (Signed)
Pt CBG 210. Pt refusing insulin. Pt says she doesn't "want to get started on it" and that she will take her po diabetic meds that she takes at home. Explained that due to her infection and surgery, her blood sugars may fluctuate more and that insulin is used in combination with po meds to help keep sugars regulated. Pt still refuses to take insulin. [name], [name]

## 2012-07-19 NOTE — Progress Notes (Signed)
[facility]  Daily Progress Note  Assessment/Planning: POD #4 s/p L TMA   TMA still viable  Continue abx  Awaiting placement   Subjective  - 4 Days Post-Op  Notes some drainage from TMA  Objective Filed Vitals:   07/18/12 1239 07/18/12 1757 07/18/12 2004 07/19/12 0416  BP: 147/71 145/68 118/75 116/61  Pulse: 105 102  91  Temp: 98.8 F (37.1 C) 98.6 F (37 C) 99 F (37.2 C) 98.8 F (37.1 C)  TempSrc: Oral Oral Core (Comment) Oral  Resp: 20 18 18 18   Height:      Weight:      SpO2: 98% 98% 98% 100%    Intake/Output Summary (Last 24 hours) at 07/19/12 1026 Last data filed at 07/18/12 2135  Gross per 24 hour  Intake    360 ml  Output   1700 ml  Net  -1340 ml    PULM  CTAB CV  RRR GI  soft, NTND VASC  L TMA incision c/i, some sanguinous drainage from incision, small amount of possible purulence at medial staple line   Laboratory CBC    Component Value Date/Time   WBC 14.5* 07/17/2012 0640   HGB 9.0* 07/17/2012 0640   HCT 29.1* 07/17/2012 0640   PLT 407* 07/17/2012 0640    BMET    Component Value Date/Time   NA 133* 07/17/2012 0640   K 3.9 07/17/2012 0640   CL 93* 07/17/2012 0640   CO2 27 07/17/2012 0640   GLUCOSE 199* 07/17/2012 0640   BUN 19 07/17/2012 0640   CREATININE 6.38* 07/17/2012 0640   CALCIUM 11.4* 07/17/2012 0640   GFRNONAA 7* 07/17/2012 0640   GFRAA 8* 07/17/2012 0640    Leonides Sake, MD [facility] Office: [phone] Pager: [phone]  07/19/2012, 10:26 AM

## 2012-07-20 DIAGNOSIS — N186 End stage renal disease: Secondary | ICD-10-CM | POA: Diagnosis not present

## 2012-07-20 LAB — GLUCOSE, CAPILLARY
Glucose-Capillary: 77 mg/dL (ref 70–99)
Glucose-Capillary: 289 mg/dL — ABNORMAL HIGH (ref 70–99)
Glucose-Capillary: 308 mg/dL — ABNORMAL HIGH (ref 70–99)

## 2012-07-20 NOTE — Progress Notes (Signed)
[facility] Progress Note  Subjective:   Wants to go home to get clean clothes and comeback, but ok with going home if MD thinks she is ready.  Objective Filed Vitals:   07/19/12 1400 07/19/12 1723 07/19/12 2057 07/20/12 0523  BP: 124/78 134/80 147/63 130/63  Pulse: 92 82 101 88  Temp: 98 F (36.7 C) 98 F (36.7 C) 98.1 F (36.7 C) 97.9 F (36.6 C)  TempSrc: Oral Oral Oral Oral  Resp: 18 18 17 16   Height:      Weight:   69.1 kg (152 lb 5.4 oz)   SpO2: 98% 98% 100% 100%   Physical Exam General: talkative sitting on side of bed Heart: RRR Lungs: no wheezes or rales Abdomen: soft Extremities: left LE tr edema; left transmet wrapped. Dialysis Access: Rt thigh AVG + bruit   Dialysis Orders: Center: [facility] on MWF .  EDW 67 kg Optiflux 160 HD Bath 2K/ 3.5Ca Time 3:30 Heparin 5000. Access R thigh AVG BFR 450 DFR A1.5  Hectorol 0 mcg IV/HD Epogen 18000 Units IV/HD Venofer 0  (Labs 2/26 - Tsat 35%, P 2.6, PTH <5.5)   Assessment/Plan:  1. 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 Edilia Bo. BC's neg thus far. Vanc/Zosyn changed to [place] in anticipation of abx at HD continuing after discharge - ? Duration of treatment at discharge. 2. AMS- due to fevers / anesthesia, resolved 3. ESRD, cont mwf HD. K 3.9 - On 4K+ bath; post HD weight 68.2 with UF 2.6 Friday.; HD orders written for Monday first round in case she is not d/c today; tight heparin 4. Hypertension/volume - volume control and  Amlodipine 10/d.  5. Anemia - Hgb 9.0 < 12.2 post op. Continue Aranesp 150 q Wednesdays HD. Last Tsat 35% on 2/26. No IV Fe for now- evaluate for IV Fe at dialysis center. If ferritin is ok, needs at least weekly.. 6. Metabolic bone disease - Ca 11.4. 3/27 and PTH <5.5 s/p PTX. - on added Ca bath prior to admission; will change at discharge 7. Nutrition - . Renal diet/nepro and multivitamin 8. DM - BS elevated; on glipizide 10 bid; pt resistant to SSI coverage 9. Disp - d/c per [facility]VS  when ready -     Sheffield Slider, PA-C [facility] Beeper [phone] 07/20/2012,8:41 AM  LOS: 5 days   Patient seen and examined.  Agree with assessment and plan as above. Vinson Moselle  MD [phone] pgr    [phone] cell 07/20/2012, 12:41 PM    Additional Objective Labs:  Basic Metabolic Panel:  Recent Labs Lab 07/15/12 1323 07/15/12 1707 07/16/12 0947 07/17/12 0640  NA 134*  --  133* 133*  K 4.2  --  4.6 3.9  CL  --   --  94* 93*  CO2  --   --  24 27  GLUCOSE 228*  --  267* 199*  BUN  --   --  37* 19  CREATININE  --  7.93* 9.87* 6.38*  CALCIUM  --   --  10.3 11.4*  .lasteg CBC:  Recent Labs Lab 07/15/12 1707 07/16/12 0947 07/17/12 0640  WBC 13.2* 12.9* 14.5*  HGB 9.7* 8.4* 9.0*  HCT 31.1* 26.3* 29.1*  MCV 85.9 85.7 84.8  PLT 366 348 407*   Blood Culture    Component Value Date/Time   SDES BLOOD RIGHT HAND 07/16/2012 0950   SPECREQUEST BOTTLES DRAWN AEROBIC ONLY 10CC 07/16/2012 0950   CULT  BLOOD CULTURE RECEIVED NO GROWTH TO DATE CULTURE WILL BE HELD FOR 5 DAYS BEFORE ISSUING A FINAL NEGATIVE REPORT 07/16/2012 0950   REPTSTATUS PENDING 07/16/2012 0950   Medications:   . amLODipine  10 mg Oral Daily  . aspirin EC  81 mg Oral Daily  . calcium carbonate  2 tablet Oral TID WC  . cefTAZidime (FORTAZ)  IV  2 g Intravenous Q M,W,F-HD  . darbepoetin (ARANESP) injection - DIALYSIS  150 mcg Intravenous Q Wed-HD  . docusate sodium  100 mg Oral Daily  . enoxaparin (LOVENOX) injection  30 mg Subcutaneous Q24H  . glipiZIDE  10 mg Oral BID AC  . insulin aspart  0-9 Units Subcutaneous TID WC  . multivitamin  1 tablet Oral QHS  . mupirocin ointment   Nasal BID  . pantoprazole  80 mg Oral Q1200  . sodium chloride  3 mL Intravenous Q12H  . vancomycin  750 mg Intravenous Q M,W,F-HD

## 2012-07-20 NOTE — Progress Notes (Addendum)
[facility]  Postoperative Visit - Amputation  Date of Surgery: 07/15/2012 Procedure(s): TRANSMETATARSAL AMPUTATION Left Surgeon: Surgeon(s): Chuck Hint, MD POD: 5 Days Post-Op  Subjective [name] is a 52 y.o. female who is S/P Left Procedure(s): TRANSMETATARSAL AMPUTATION.  Pt.denies increased pain in the stump. The patient notes pain is well controlled. Pt. denies phantom pain.  Significant Diagnostic Studies: CBC Lab Results  Component Value Date   WBC 14.5* 07/17/2012   HGB 9.0* 07/17/2012   HCT 29.1* 07/17/2012   MCV 84.8 07/17/2012   PLT 407* 07/17/2012    BMET    Component Value Date/Time   NA 133* 07/17/2012 0640   K 3.9 07/17/2012 0640   CL 93* 07/17/2012 0640   CO2 27 07/17/2012 0640   GLUCOSE 199* 07/17/2012 0640   BUN 19 07/17/2012 0640   CREATININE 6.38* 07/17/2012 0640   CALCIUM 11.4* 07/17/2012 0640   GFRNONAA 7* 07/17/2012 0640   GFRAA 8* 07/17/2012 0640    COAG Lab Results  Component Value Date   INR 0.9 06/15/2008   INR 1.2 01/23/2007   INR 1.3 01/23/2007   No results found for this basename: PTT     Intake/Output Summary (Last 24 hours) at 07/20/12 0844 Last data filed at 07/19/12 1700  Gross per 24 hour  Intake    720 ml  Output      0 ml  Net    720 ml   Patient Vitals for the past 24 hrs:  Stool Color  07/19/12 1000 Brown     Physical Examination  BP Readings from Last 3 Encounters:  07/20/12 130/63  07/20/12 130/63  07/02/12 175/86   Temp Readings from Last 3 Encounters:  07/20/12 97.9 F (36.6 C) Oral  07/20/12 97.9 F (36.6 C) Oral  05/28/12 99.8 F (37.7 C) Oral   SpO2 Readings from Last 3 Encounters:  07/20/12 100%  07/20/12 100%  05/28/12 87%   Pulse Readings from Last 3 Encounters:  07/20/12 88  07/20/12 88  07/02/12 104    Pt is A&Ox3  WDWN female with no complaints  Left amputation incision is close with serosang drainage on bandage.  There is good bone coverage in  the stump Stump is warm and well perfused, with drainage; without erythema   Assessment/plan:  [name] is a 52 y.o. female who is s/p Left Procedure(s): TRANSMETATARSAL AMPUTATION  The patient's stump is draining but viable.  Dry clean dressing applied with xeroform.  Clinton Gallant [facility] 8:44 AM 07/20/[phone]  Addendum  I have independently interviewed and examined the patient, and I agree with the physician assistant's findings.  L TMA may need to have a staple or two removed to encourage drainage.  Will have Dr. Edilia Bo decide on this tomorrow.  Cont abx.  SNF placement pending  Leonides Sake, MD [facility] Office: [phone] Pager: [phone]  07/20/2012, 9:03 AM

## 2012-07-20 NOTE — Progress Notes (Signed)
Pt requesting to leave the hospital to go home for a hour to check on "things at home" and then come back to the hospital. Dr Imogene Burn called; Dr. Imogene Burn declined giving an order for pt to leave. Pt informed. [name], Rosanna Randy

## 2012-07-21 DIAGNOSIS — N186 End stage renal disease: Secondary | ICD-10-CM | POA: Diagnosis not present

## 2012-07-21 LAB — RENAL FUNCTION PANEL
Albumin: 2.8 g/dL — ABNORMAL LOW (ref 3.5–5.2)
BUN: 38 mg/dL — ABNORMAL HIGH (ref 6–23)
CO2: 24 mEq/L (ref 19–32)
Calcium: 10 mg/dL (ref 8.4–10.5)
Chloride: 89 mEq/L — ABNORMAL LOW (ref 96–112)
Creatinine, Ser: 8.44 mg/dL — ABNORMAL HIGH (ref 0.50–1.10)
GFR calc Af Amer: 6 mL/min — ABNORMAL LOW (ref >90)
GFR calc non Af Amer: 5 mL/min — ABNORMAL LOW (ref >90)
Glucose, Bld: 169 mg/dL — ABNORMAL HIGH (ref 70–99)
Phosphorus: 3.3 mg/dL (ref 2.3–4.6)
Potassium: 4.4 mEq/L (ref 3.5–5.1)
Sodium: 129 mEq/L — ABNORMAL LOW (ref 135–145)

## 2012-07-21 LAB — CBC
HCT: 27.6 % — ABNORMAL LOW (ref 36.0–46.0)
Hemoglobin: 8.8 g/dL — ABNORMAL LOW (ref 12.0–15.0)
MCH: 26 pg (ref 26.0–34.0)
MCHC: 31.9 g/dL (ref 30.0–36.0)
MCV: 81.7 fL (ref 78.0–100.0)
Platelets: 449 10*3/uL — ABNORMAL HIGH (ref 150–400)
RBC: 3.38 MIL/uL — ABNORMAL LOW (ref 3.87–5.11)
RDW: 17 % — ABNORMAL HIGH (ref 11.5–15.5)
WBC: 10.6 10*3/uL — ABNORMAL HIGH (ref 4.0–10.5)

## 2012-07-21 MED ORDER — CEFTAZIDIME 2 G IJ SOLR
2.0000 g | INTRAMUSCULAR | Status: DC
  Filled 2012-07-21: qty 2

## 2012-07-21 MED ORDER — CALCIUM CARBONATE ANTACID 500 MG PO CHEW
2.0000 | CHEWABLE_TABLET | Freq: Three times a day (TID) | ORAL | Status: DC
  Administered 2012-07-21 – 2012-07-23 (×4): 400 mg via ORAL
  Filled 2012-07-21 (×9): qty 2

## 2012-07-21 MED ORDER — SODIUM CHLORIDE 0.9 % IV SOLN: 100.0000 mL | INTRAVENOUS | Status: DC | PRN

## 2012-07-21 MED ORDER — HEPARIN SODIUM (PORCINE) 1000 UNIT/ML DIALYSIS
1000.0000 [IU] | INTRAMUSCULAR | Status: DC | PRN
  Filled 2012-07-21: qty 1

## 2012-07-21 MED ORDER — PENTAFLUOROPROP-TETRAFLUOROETH EX AERO: 1.0000 "application " | INHALATION_SPRAY | CUTANEOUS | Status: DC | PRN

## 2012-07-21 MED ORDER — HEPARIN SODIUM (PORCINE) 1000 UNIT/ML DIALYSIS
1000.0000 [IU] | Freq: Once | INTRAMUSCULAR | Status: DC
  Filled 2012-07-21: qty 1

## 2012-07-21 MED ORDER — LIDOCAINE-PRILOCAINE 2.5-2.5 % EX CREA: 1.0000 "application " | TOPICAL_CREAM | CUTANEOUS | Status: DC | PRN

## 2012-07-21 MED ORDER — LIDOCAINE HCL (PF) 1 % IJ SOLN: 5.0000 mL | INTRAMUSCULAR | Status: DC | PRN

## 2012-07-21 MED ORDER — NEPRO/CARBSTEADY PO LIQD: 237.0000 mL | ORAL | Status: DC | PRN

## 2012-07-21 MED ORDER — ALTEPLASE 2 MG IJ SOLR: 2.0000 mg | Freq: Once | INTRAMUSCULAR | Status: DC | PRN

## 2012-07-21 NOTE — Progress Notes (Signed)
ANTIBIOTIC CONSULT NOTE - Follow-up  Pharmacy Consult for Vancomycin + Ceftazidime (per MD) Indication: r/o sepsis, s/p L transmetatarsal amputation   Allergies  Allergen Reactions  . Doxycycline Other (See Comments)    Unknown reaction  . Peroxyl (Hydrogen Peroxide) Hives and Rash    Patient Measurements: Height: 5\' 2"  (157.5 cm) Weight: 146 lb 2.6 oz (66.3 kg) (standing) IBW/kg (Calculated) : 50.1   Vital Signs: Temp: 97.5 F (36.4 C) (03/31 1022) Temp src: Oral (03/31 1022) BP: 126/76 mmHg (03/31 1022) Pulse Rate: 98 (03/31 1022) Intake/Output from previous day: 03/30 0701 - 03/31 0700 In: 180 [P.O.:180] Out: -  Intake/Output from this shift: Total I/O In: -  Out: 3207 [Other:3207]  Labs:  Recent Labs  07/21/12 0645  WBC 10.6*  HGB 8.8*  PLT 449*  CREATININE 8.44*   Estimated Creatinine Clearance: 7.2 ml/min (by C-G formula based on Cr of 8.44). No results found for this basename: VANCOTROUGH, Leodis Binet, VANCORANDOM, GENTTROUGH, GENTPEAK, GENTRANDOM, TOBRATROUGH, TOBRAPEAK, TOBRARND, AMIKACINPEAK, AMIKACINTROU, AMIKACIN,  in the last 72 hours   Microbiology: Recent Results (from the past 720 hour(s))  SURGICAL PCR SCREEN     Status: None   Collection Time    07/15/12 12:01 PM      Result Value Range Status   MRSA, PCR NEGATIVE  NEGATIVE Final   Staphylococcus aureus NEGATIVE  NEGATIVE Final   Comment:            The Xpert SA Assay (FDA     approved for NASAL specimens     in patients over 42 years of age),     is one component of     a comprehensive surveillance     program.  Test performance has     been validated by [employer] for patients greater     than or equal to 9 year old.     It is not intended     to diagnose infection nor to     guide or monitor treatment.  ANAEROBIC CULTURE     Status: None   Collection Time    07/15/12  2:05 PM      Result Value Range Status   Specimen Description WOUND LEFT FOOT   Final   Special Requests  SWAB FROM LEFT FOOT AMPUTATION SITE PT ON ZINACEF   Final   Gram Stain     Final   Value: FEW WBC PRESENT,BOTH PMN AND MONONUCLEAR     RARE SQUAMOUS EPITHELIAL CELLS PRESENT     FEW GRAM NEGATIVE RODS     FEW GRAM POSITIVE COCCI IN PAIRS   Culture     Final   Value: NO ANAEROBES ISOLATED; CULTURE IN PROGRESS FOR 5 DAYS   Report Status PENDING   Incomplete  WOUND CULTURE     Status: None   Collection Time    07/15/12  2:08 PM      Result Value Range Status   Specimen Description WOUND LEFT FOOT   Final   Special Requests SWAB FROM LEFT FOOT AMPUTATION SITE PT ON ZINACEF   Final   Gram Stain     Final   Value: FEW WBC PRESENT,BOTH PMN AND MONONUCLEAR     RARE SQUAMOUS EPITHELIAL CELLS PRESENT     FEW GRAM NEGATIVE RODS     FEW GRAM POSITIVE COCCI IN PAIRS   Culture NO GROWTH 3 DAYS   Final   Report Status 07/18/2012 FINAL   Final  CULTURE, BLOOD (  ROUTINE X 2)     Status: None   Collection Time    07/16/12  9:40 AM      Result Value Range Status   Specimen Description BLOOD RIGHT HAND   Final   Special Requests BOTTLES DRAWN AEROBIC ONLY 6CC   Final   Culture  Setup Time 07/16/2012 15:37   Final   Culture     Final   Value:        BLOOD CULTURE RECEIVED NO GROWTH TO DATE CULTURE WILL BE HELD FOR 5 DAYS BEFORE ISSUING A FINAL NEGATIVE REPORT   Report Status PENDING   Incomplete  CULTURE, BLOOD (ROUTINE X 2)     Status: None   Collection Time    07/16/12  9:50 AM      Result Value Range Status   Specimen Description BLOOD RIGHT HAND   Final   Special Requests BOTTLES DRAWN AEROBIC ONLY 10CC   Final   Culture  Setup Time 07/16/2012 15:37   Final   Culture     Final   Value:        BLOOD CULTURE RECEIVED NO GROWTH TO DATE CULTURE WILL BE HELD FOR 5 DAYS BEFORE ISSUING A FINAL NEGATIVE REPORT   Report Status PENDING   Incomplete    Medical History: Past Medical History  Diagnosis Date  . ESRD (end stage renal disease)   . Type 2 diabetes mellitus   . Hypertension   . CVA  (cerebral infarction)     right parietal 05/19/2000  . Vaginal bleeding   . Hyperparathyroidism   . Pneumonia   . Stroke     2002,no residual  . Peripheral vascular disease   . GERD (gastroesophageal reflux disease)   . Anemia     Medications:  Prescriptions prior to admission  Medication Sig Dispense Refill  . Acetaminophen (TYLENOL PO) Take 1-2 tablets by mouth See admin instructions. Uses at dialysis Mon, Wed, Fridays each week for muscle spasms      . amLODipine (NORVASC) 10 MG tablet Take 10 mg by mouth daily.        Marland Kitchen aspirin 81 MG tablet Take 81 mg by mouth daily.        . calcium carbonate (TUMS - DOSED IN MG ELEMENTAL CALCIUM) 500 MG chewable tablet Chew 2 tablets by mouth 3 (three) times daily with meals.       . diazepam (VALIUM) 2 MG tablet Take 2 mg by mouth every 6 (six) hours as needed. For muscle spasms      . esomeprazole (NEXIUM) 40 MG capsule Take 40 mg by mouth daily as needed. For heartburn      . glipiZIDE (GLUCOTROL) 10 MG tablet Take 10 mg by mouth 2 (two) times daily before a meal.       . HYDROcodone-acetaminophen (NORCO/VICODIN) 5-325 MG per tablet Take 1 tablet by mouth every 6 (six) hours as needed. For pain      . LIDOCAINE EX Apply 1 application topically See admin instructions. Use 1 hour prior to dialysis as needed for pain on Mon, Wed and Fri.      . multivitamin (RENA-VIT) TABS tablet Take 1 tablet by mouth daily.        Assessment: 52 y.o. F started empirically on Vancomycin + Ceftazidime per MD for r/o sepsis after spiking fevers s/p left transmetatarsal amputation on 3/25. The patient is now afebrile, WBC 10.6 << 14.5. The patient has no positive cultures thus far. Per [facility]VS -- to  continue antibiotics for now -- the patient may need a left BKA if it does not heal appropriately. Doses remain appropriate at this time.   Goal of Therapy:  Pre-HD Vancomycin level of 15-25 mcg/ml  Plan:  1. Continue Vancomycin 750 mg post HD sessions on M/W/F 2. Will  continue to follow HD schedule/duration, culture results, LOT, and antibiotic de-escalation plans   Georgina Pillion, PharmD, BCPS Clinical Pharmacist Pager: [phone] 07/21/2012 1:09 PM

## 2012-07-21 NOTE — Progress Notes (Signed)
Physical Therapy Treatment Patient Details Name: [name] MRN: [id number] DOB: [birth year] Today's Date: 07/21/2012 Time: 4098-1191 PT Time Calculation (min): 27 min  PT Assessment / Plan / Recommendation Comments on Treatment Session  Pt improving with cognition and thus with mobility. Agreeable to post-op shoe and PWB LLE, ambulating with safety with RW. Still slightly impulsive but this appears to ber her baseline.  Pt ambulated 200' with RW and supervision today. PT will continue to follow.    Follow Up Recommendations  SNF     Does the patient have the potential to tolerate intense rehabilitation     Barriers to Discharge        Equipment Recommendations  Rolling walker with 5" wheels    Recommendations for Other Services    Frequency Min 3X/week   Plan Discharge plan remains appropriate;Frequency remains appropriate    Precautions / Restrictions Precautions Precautions: Fall Required Braces or Orthoses: Other Brace/Splint Other Brace/Splint: post-op shoe left Restrictions Weight Bearing Restrictions: Yes LLE Weight Bearing: Partial weight bearing LLE Partial Weight Bearing Percentage or Pounds: heel only   Pertinent Vitals/Pain 4/10 faces scale left foot pain, elevated after ambulation    Mobility  Bed Mobility Bed Mobility: Supine to Sit;Sitting - Scoot to Edge of Bed Supine to Sit: 6: Modified independent (Device/Increase time);HOB elevated Sitting - Scoot to Edge of Bed: 6: Modified independent (Device/Increase time) Transfers Transfers: Sit to Stand;Stand to Sit Sit to Stand: 6: Modified independent (Device/Increase time);From bed;With upper extremity assist Stand to Sit: 6: Modified independent (Device/Increase time);To chair/3-in-1;With upper extremity assist Details for Transfer Assistance: pt stood safely today with appropriate use of hands, no physical assist or safety cues needed Ambulation/Gait Ambulation/Gait Assistance: 5: Supervision Ambulation  Distance (Feet): 200 Feet Assistive device: Rolling walker Ambulation/Gait Assistance Details: vc's for sequencing to decrease pain, several standing rest breaks due to throbbing in left foot Gait Pattern: Step-to pattern Gait velocity: decreased Stairs: No Wheelchair Mobility Wheelchair Mobility: No    Exercises General Exercises - Lower Extremity Ankle Circles/Pumps: AROM;Both;15 reps;Seated   PT Diagnosis:    PT Problem List:   PT Treatment Interventions:     PT Goals Acute Rehab PT Goals PT Goal Formulation: Patient unable to participate in goal setting Time For Goal Achievement: 07/31/12 Potential to Achieve Goals: Good Pt will go Supine/Side to Sit: with supervision PT Goal: Supine/Side to Sit - Progress: Met Pt will go Sit to Supine/Side: with supervision PT Goal: Sit to Supine/Side - Progress: Met Pt will go Sit to Stand: with supervision PT Goal: Sit to Stand - Progress: Met Pt will go Stand to Sit: with supervision PT Goal: Stand to Sit - Progress: Met Pt will Stand: 3 - 5 min;with modified independence;with bilateral upper extremity support PT Goal: Stand - Progress: Progressing toward goal Pt will Ambulate: >150 feet;with modified independence;with rolling walker PT Goal: Ambulate - Progress: Updated due to goal met  Visit Information  Last PT Received On: 07/21/12 Assistance Needed: +1    Subjective Data  Subjective: pt agreeable to ambulation Patient Stated Goal: return to walking safely   Cognition  Cognition Overall Cognitive Status: Impaired Area of Impairment: Attention Arousal/Alertness: Awake/alert Orientation Level: Appears intact for tasks assessed Behavior During Session: [facility] for tasks performed Current Attention Level: Selective Attention - Other Comments: pt self-distracted Cognition - Other Comments: cognition seems to be much improved, guessing that she is close to baseline. Repeats self frequently and quite self-distracted but appropriate  today    Balance  Balance  Balance Assessed: Yes Static Standing Balance Static Standing - Balance Support: No upper extremity supported;During functional activity Static Standing - Level of Assistance: 6: Modified independent (Device/Increase time) Dynamic Standing Balance Dynamic Standing - Balance Support: No upper extremity supported;During functional activity Dynamic Standing - Level of Assistance: 5: Stand by assistance  End of Session PT - End of Session Equipment Utilized During Treatment: Gait belt;Other (comment) (post-op shoe) Activity Tolerance: Patient tolerated treatment well Patient left: in chair;with call bell/phone within reach;with chair alarm set Nurse Communication: Mobility status   GP   Lyanne Co, PT  Acute Rehab Services  [phone]   Lyanne Co 07/21/2012, 4:46 PM

## 2012-07-21 NOTE — Progress Notes (Signed)
VASCULAR PROGRESS NOTE  SUBJECTIVE: Mild pain left foot.  PHYSICAL EXAM: Filed Vitals:   07/21/12 0900 07/21/12 0930 07/21/12 1000 07/21/12 1022  BP: 137/61 116/60 109/72 126/76  Pulse: 102 99 100 98  Temp:    97.5 F (36.4 C)  TempSrc:    Oral  Resp: 20 21 20  98  Height:      Weight:    146 lb 2.6 oz (66.3 kg)  SpO2:    100%   Some drainage on dressing from TMA. I removed about 5 staples. No obvious area of purulence.    LABS: Lab Results  Component Value Date   WBC 10.6* 07/21/2012   HGB 8.8* 07/21/2012   HCT 27.6* 07/21/2012   MCV 81.7 07/21/2012   PLT 449* 07/21/2012   Lab Results  Component Value Date   CREATININE 8.44* 07/21/2012   Lab Results  Component Value Date   INR 0.9 06/15/2008   CBG (last 3)   Recent Labs  07/20/12 1133 07/20/12 1627 07/20/12 2127  GLUCAP 77 289* 308*   ASSESSMENT AND PLAN: 1. 6 Days Post-Op s/p: Left TMA. 2. Awaiting SNF. If left TMA site stable tomorrow, then she could go to SNF and I can follow this as an outpt. If it drains more then I would need to take out more staples and open wound more. I have explained again that there is still significant risk of this not healing in which case she would require Left BKA.  3. Continue Fotaz and Vanco IV which can be given at the time of dialysis.   Cari Caraway Beeper: [phone] 07/21/2012

## 2012-07-21 NOTE — Procedures (Signed)
Pt seen on HD.  AP 170  Vp 280.  On lower Ca bath now..cancer 10.0 with Alb 2.8.  Working with PT.

## 2012-07-21 NOTE — Progress Notes (Signed)
[facility] Progress Note  Subjective:   Seen on HD.  Mental status seems to be at baseline. Did not indicate any intention to leave AMA today. Says she is ready to go home but wants to make sure she's well enough. C/o L stump pain but is trying not to rely on pain medication. Also expressed some concern for infection on her rt great toe s/p toenail coming off.  Objective Filed Vitals:   07/21/12 0900 07/21/12 0930 07/21/12 1000 07/21/12 1022  BP: 137/61 116/60 109/72 126/76  Pulse: 102 99 100 98  Temp:    97.5 F (36.4 C)  TempSrc:    Oral  Resp: 20 21 20  98  Height:      Weight:    66.3 kg (146 lb 2.6 oz)  SpO2:    100%   Physical Exam General: Alert and talkative today. NAD Heart: RRR Lungs: CTA bilaterally. No wheezes, rales, rhonchi Abdomen: Soft, NT, normal BS Extremities: LLE with  1+ edema, s/p TMA wrapped with trace drainage.  RLE with trace edema and  great toe with scab/skin changes/ slight odor. Dialysis Access: Rt thigh AVG cannulated  Dialysis Orders: Center: [facility] on M[facility] .  EDW 67 kg Optiflux 160 HD Bath 2K/ 3.5Ca Time 3:30 Heparin 5000. Access R thigh AVG BFR 450 DFR A1.5  Hectorol 0 mcg IV/HD Epogen 18000 Units IV/HD Venofer 0  (Labs 2/26 - Tsat 35%, P 2.6, PTH <5.5)   Assessment/Plan: 1. 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 Edilia Bo. Still with some drainage. BC's remain neg. On empiric Vanc/Zosyn. Will continue these medications on HD after discharge per VVS recommendations.  2. Skin changes /Scab Sherrin Daisy of Rt Great Toe - Does not appear gangrenous, will need to follow this closely past discharge. 3. AMS- due to fevers / anesthesia, resolved 4. ESRD, cont mwf HD. K+ 4.4  Next HD Wed if still admitted. Continue tight heparin 5. Hypertension/volume - BP 126/76 on Amlodipine 10/ qd. Net UF today of 3.2L with post wgt of 66.3kg. May need EDW adjustment at d/c 6. Anemia - Hgb 8.8 < 12.2 post op. Continue Aranesp 150 q Wednesdays HD and follow CBC.  No transfusion for now. Last Tsat 35% on 2/26. No IV Fe for now- evaluate for IV Fe at dialysis center. 7. Metabolic bone disease - Ca 10.0 (10.9 corrected)  PTH <5.5 s/p PTX. - on added Ca bath prior to admission; will revaluate prior to discharge 8. Nutrition - . Renal diet/nepro and multivitamin 9. DM - BS elevated; on glipizide 10 bid; pt resistant to SSI coverage 10. Disp - d/c per VVS when ready. SNF placement pending per notes    Scot Jun. Thad Ranger [facility] Pager [phone] 07/21/2012,12:29 PM  LOS: 6 days  I have seen and examined this patient and agree with plan per Claud Kelp.  Pt seen on HD, see procedure note. Briston Lax T,MD 07/22/2012 4:28 PM  Additional Objective Labs: Basic Metabolic Panel:  Recent Labs Lab 07/16/12 0947 07/17/12 0640 07/21/12 0645  NA 133* 133* 129*  K 4.6 3.9 4.4  CL 94* 93* 89*  CO2 24 27 24   GLUCOSE 267* 199* 169*  BUN 37* 19 38*  CREATININE 9.87* 6.38* 8.44*  CALCIUM 10.3 11.4* 10.0  PHOS  --   --  3.3   Liver Function Tests:  Recent Labs Lab 07/21/12 0645  ALBUMIN 2.8*   CBC:  Recent Labs Lab 07/15/12 1707 07/16/12 0947 07/17/12 0640 07/21/12  0645  WBC 13.2* 12.9* 14.5* 10.6*  HGB 9.7* 8.4* 9.0* 8.8*  HCT 31.1* 26.3* 29.1* 27.6*  MCV 85.9 85.7 84.8 81.7  PLT 366 348 407* 449*   Blood Culture    Component Value Date/Time   SDES BLOOD RIGHT HAND 07/16/2012 0950   SPECREQUEST BOTTLES DRAWN AEROBIC ONLY 10CC 07/16/2012 0950   CULT        BLOOD CULTURE RECEIVED NO GROWTH TO DATE CULTURE WILL BE HELD FOR 5 DAYS BEFORE ISSUING A FINAL NEGATIVE REPORT 07/16/2012 0950   REPTSTATUS PENDING 07/16/2012 0950     Recent Labs Lab 07/19/12 2035 07/20/12 0731 07/20/12 1133 07/20/12 1627 07/20/12 2127  GLUCAP 235* 91 77 289* 308*   Studies/Results: No results found. Medications:   . amLODipine  10 mg Oral Daily  . aspirin EC  81 mg Oral Daily  . calcium carbonate  2 tablet Oral TID WC  .  cefTAZidime (FORTAZ)  IV  2 g Intravenous Q M,W,F-HD  . darbepoetin (ARANESP) injection - DIALYSIS  150 mcg Intravenous Q Wed-HD  . docusate sodium  100 mg Oral Daily  . glipiZIDE  10 mg Oral BID AC  . insulin aspart  0-9 Units Subcutaneous TID WC  . multivitamin  1 tablet Oral QHS  . mupirocin ointment   Nasal BID  . pantoprazole  80 mg Oral Q1200  . sodium chloride  3 mL Intravenous Q12H  . vancomycin  750 mg Intravenous Q M,W,F-HD

## 2012-07-21 NOTE — Progress Notes (Signed)
Utilization review completed.  

## 2012-07-22 DIAGNOSIS — N186 End stage renal disease: Secondary | ICD-10-CM | POA: Diagnosis not present

## 2012-07-22 DIAGNOSIS — I96 Gangrene, not elsewhere classified: Secondary | ICD-10-CM | POA: Diagnosis not present

## 2012-07-22 DIAGNOSIS — I120 Hypertensive chronic kidney disease with stage 5 chronic kidney disease or end stage renal disease: Secondary | ICD-10-CM | POA: Diagnosis not present

## 2012-07-22 DIAGNOSIS — I739 Peripheral vascular disease, unspecified: Secondary | ICD-10-CM | POA: Diagnosis not present

## 2012-07-22 LAB — CULTURE, BLOOD (ROUTINE X 2): Culture: NO GROWTH

## 2012-07-22 LAB — GLUCOSE, CAPILLARY
Glucose-Capillary: 296 mg/dL — ABNORMAL HIGH (ref 70–99)
Glucose-Capillary: 228 mg/dL — ABNORMAL HIGH (ref 70–99)
Glucose-Capillary: 178 mg/dL — ABNORMAL HIGH (ref 70–99)

## 2012-07-22 LAB — ANAEROBIC CULTURE

## 2012-07-22 NOTE — Progress Notes (Addendum)
[facility]  Daily Progress Note  Assessment/Planning: 7 days post-op left TMA. WBA in hard sole shoe.  Elevation when at rest. Follow up for staple check and removal 1 week Wash with dial soap. If there is increased drainage she will need to be seen sooner.  This could be at risk for not healing and she may need a Left BKA. She will continue [place] and Vanco at dialysis.  Subjective  - 7 Days Post-Op patient is ambulating with hard sole shoe.  She lives alone and we are considering home verses SNF.    Objective Filed Vitals:   07/21/12 1423 07/21/12 1814 07/21/12 2124 07/22/12 0514  BP: 116/56 108/53 122/63 145/70  Pulse: 105 95 98 99  Temp: 97.7 F (36.5 C) 97.1 F (36.2 C) 97.9 F (36.6 C) 97.7 F (36.5 C)  TempSrc: Oral Oral Oral Oral  Resp: 16 16 18 18   Height:      Weight:   148 lb 5.9 oz (67.3 kg)   SpO2: 100% 100% 100% 100%    Intake/Output Summary (Last 24 hours) at 07/22/12 1001 Last data filed at 07/22/12 0300  Gross per 24 hour  Intake      0 ml  Output   3207 ml  Net  -3207 ml   PE:  Left TMA warm to touch min. SS drainage on the dressing.  No active drainage. Clean dry dressing applied.  Laboratory CBC    Component Value Date/Time   WBC 10.6* 07/21/2012 0645   HGB 8.8* 07/21/2012 0645   HCT 27.6* 07/21/2012 0645   PLT 449* 07/21/2012 0645    BMET    Component Value Date/Time   NA 129* 07/21/2012 0645   K 4.4 07/21/2012 0645   CL 89* 07/21/2012 0645   CO2 24 07/21/2012 0645   GLUCOSE 169* 07/21/2012 0645   BUN 38* 07/21/2012 0645   CREATININE 8.44* 07/21/2012 0645   CALCIUM 10.0 07/21/2012 0645   GFRNONAA 5* 07/21/2012 0645   GFRAA 6* 07/21/2012 0645    COLLINS, EMMA MAUREEN PA-C [facility] Office: [phone] Pager: [phone]  07/22/2012, 10:01 AM  Agree with above.  Patient has evidence of severe protein calorie malnutrition. Her BMI is 17. She's had a 10%  weight loss in the  last 8 months, and has severe muscle wasting.. This is currently being treated with Ensure pudding by mouth 3 times a day. Appreciate nutrition teams input.  Given that she can receive intravenous antibiotics at the time of dialysis, and dressing changes can be done as an outpatient, she is ready for discharge to skilled nursing facility once a bed is available.  Waverly Ferrari, MD, FACS Beeper [phone] 07/22/2012

## 2012-07-22 NOTE — Clinical Social Work Psychosocial (Signed)
Clinical Social Work Department BRIEF PSYCHOSOCIAL ASSESSMENT 07/22/2012  Patient:  [name], [name]     Account Number:  [id number]     Admit date:  07/15/2012  Clinical Social Worker:  Delmer Islam  Date/Time:  07/22/2012 01:24 AM  Referred by:  Physician  Date Referred:  07/20/2012 Referred for  SNF Placement   Other Referral:   Interview type:  Patient Other interview type:    PSYCHOSOCIAL DATA Living Status:  ALONE Admitted from facility:   Level of care:   Primary support name:  [name] Primary support relationship to patient:  SIBLING Degree of support available:   Patient's brother and sister-in-law [name] very supportive    CURRENT CONCERNS Current Concerns  Post-Acute Placement   Other Concerns:    SOCIAL WORK ASSESSMENT / PLAN CSW and nurse care manager [name] made introcudtions and stated purpose of visit. CSW and Care Manager talked with patient about discharge plans and need for ST rehab given current medical status and living alone. Patient reported that she had an aide years ago but currently has no one that can be with her at discharge. Patient also talked about an outpatient rehab facility and this was discussed and patient understood that outpt rehab not appropriate after discharge from hospital. [name] very engaged in conversation and agreed that ST rehab is a good idea.    Patient has [facility] transportation via [facility] transport for dialysis to [facility].   Assessment/plan status:  Psychosocial Support/Ongoing Assessment of Needs Other assessment/ plan:   Information/referral to community resources:   Patient given SNF list for [facility].    PATIENT'S/FAMILY'S RESPONSE TO PLAN OF CARE: [name] very pleasant and engaged in conversation with CSW and nurse care manager. She is in agreement with short-term rehab and plans to let her brother and sister-in-law know about the discharge plan. CSW advised  patient that she will be given facility responses.

## 2012-07-22 NOTE — Progress Notes (Signed)
[facility] Progress Note  Subjective:   A little pain with staple removal yesterday. PT going well. No complaints.  Objective Filed Vitals:   07/21/12 1423 07/21/12 1814 07/21/12 2124 07/22/12 0514  BP: 116/56 108/53 122/63 145/70  Pulse: 105 95 98 99  Temp: 97.7 F (36.5 C) 97.1 F (36.2 C) 97.9 F (36.6 C) 97.7 F (36.5 C)  TempSrc: Oral Oral Oral Oral  Resp: 16 16 18 18   Height:      Weight:   67.3 kg (148 lb 5.9 oz)   SpO2: 100% 100% 100% 100%   Physical Exam General: Alert, cooperative, NAD Heart: RRR Lungs: CTA bilaterally. No wheezes, rales or rhonchi Abdomen: Soft, NT, normal BS Extremities: No LE edema. Left TMA wrapped. Rt with clean sock Dialysis Access: RT thigh AVG with + bruit  Dialysis Orders: Center: [facility] on MWF .  EDW 67 kg Optiflux 160 HD Bath 2K/ 3.5Ca Time 3:30 Heparin 5000. Access R thigh AVG BFR 450 DFR A1.5  Hectorol 0 mcg IV/HD Epogen 18000 Units IV/HD Venofer 0  (Labs 2/26 - Tsat 35%, P 2.6, PTH <5.5)   Assessment/Plan: 1. 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 Edilia Bo. Still with some drainage, and still significant risk of not healing and requiring Left BKA per notes. BC's negative per final report. On empiric Vanc/Zosyn. Will continue  on HD after discharge per VVS recommendations. ?duration 2. Skin changes /Scab /Odor of Rt Great Toe - Does not appear gangrenous, will need to follow this closely past discharge. 3. AMS- due to fevers / anesthesia, resolved 4. ESRD, cont mwf HD. K+ 4.4 Next HD Wed if still admitted. Continue tight heparin 5. Hypertension/volume - BP 145/70 on Amlodipine 10 mg qd. Net UF Monday of 3.2L with post wgt of 66.3kg. May need EDW adjustment at d/c 6. Anemia - Hgb 8.8 < 12.2 post op. Continue Aranesp 150 q Wednesdays HD and follow CBC. No transfusion for now. Last Tsat 35% on 2/26. No IV Fe for now- evaluate for IV Fe at dialysis center. 7. Metabolic bone disease - Ca 10.0 (10.9 corrected) P 3.3 on Tums.  PTH <5.5 s/p PTX. - on added Ca bath prior to admission; will revaluate prior to discharge 8. Nutrition - Renal diet/nepro and multivitamin 9. DM - BS elevated; on glipizide 10 bid; pt resistant to SSI coverage 10. Disp - d/c per VVS when ready. SNF placement pending per notes    Scot Jun. Thad Ranger [facility] Pager [phone] 07/22/2012,9:06 AM  LOS: 7 days  I have seen and examined this patient and agree with plan per Claud Kelp.  Will plan first shift HD in case she goes to rehab facility.  She feels well with no new CO. Korey Arroyo T,MD 07/22/2012 1:08 PM  Additional Objective Labs: Basic Metabolic Panel:  Recent Labs Lab 07/16/12 0947 07/17/12 0640 07/21/12 0645  NA 133* 133* 129*  K 4.6 3.9 4.4  CL 94* 93* 89*  CO2 24 27 24   GLUCOSE 267* 199* 169*  BUN 37* 19 38*  CREATININE 9.87* 6.38* 8.44*  CALCIUM 10.3 11.4* 10.0  PHOS  --   --  3.3   Liver Function Tests:  Recent Labs Lab 07/21/12 0645  ALBUMIN 2.8*   No results found for this basename: LIPASE, AMYLASE,  in the last 168 hours CBC:  Recent Labs Lab 07/15/12 1707 07/16/12 0947 07/17/12 0640 07/21/12 0645  WBC 13.2* 12.9* 14.5* 10.6*  HGB 9.7* 8.4* 9.0* 8.8*  HCT 31.1* 26.3* 29.1* 27.6*  MCV 85.9 85.7 84.8 81.7  PLT 366 348 407* 449*   Blood Culture    Component Value Date/Time   SDES BLOOD RIGHT HAND 07/16/2012 0950   SPECREQUEST BOTTLES DRAWN AEROBIC ONLY 10CC 07/16/2012 0950   CULT NO GROWTH 5 DAYS 07/16/2012 0950   REPTSTATUS 07/22/2012 FINAL 07/16/2012 0950    CBG:  Recent Labs Lab 07/20/12 2127 07/21/12 1202 07/21/12 1722 07/21/12 2128 07/22/12 0743  GLUCAP 308* 186* 244* 182* 115*   IStudies/Results: No results found. Medications:   . amLODipine  10 mg Oral Daily  . aspirin EC  81 mg Oral Daily  . calcium carbonate  2 tablet Oral TID WC  . [START ON 07/23/2012] cefTAZidime (FORTAZ)  IV  2 g Intravenous Q M,W,F-2000  . darbepoetin (ARANESP) injection -  DIALYSIS  150 mcg Intravenous Q Wed-HD  . docusate sodium  100 mg Oral Daily  . glipiZIDE  10 mg Oral BID AC  . insulin aspart  0-9 Units Subcutaneous TID WC  . multivitamin  1 tablet Oral QHS  . mupirocin ointment   Nasal BID  . pantoprazole  80 mg Oral Q1200  . sodium chloride  3 mL Intravenous Q12H  . vancomycin  750 mg Intravenous Q M,W,F-HD

## 2012-07-22 NOTE — Clinical Social Work Placement (Addendum)
Clinical Social Work Department CLINICAL SOCIAL WORK PLACEMENT NOTE 07/22/2012  Patient:  [name], [name]  Account Number:  [id number] Admit date:  07/15/2012  Clinical Social Worker:  Genelle Bal, LCSW  Date/time:  07/22/2012 01:42 AM  Clinical Social Work is seeking post-discharge placement for this patient at the following level of care:   SKILLED NURSING   (*CSW will update this form in Epic as items are completed)   07/22/2012  Patient/family provided with [facility] Department of Clinical Social Work's list of facilities offering this level of care within the geographic area requested by the patient (or if unable, by the patient's family).  07/22/2012  Patient/family informed of their freedom to choose among providers that offer the needed level of care, that participate in Medicare, Medicaid or managed care program needed by the patient, have an available bed and are willing to accept the patient.    Patient/family informed of [facility]' ownership interest in [facility], as well as of the fact that they are under no obligation to receive care at this facility.  PASARR submitted to EDS on 07/22/2012 PASARR number received from EDS on 07/22/12  FL2 transmitted to all facilities in geographic area requested by pt/family on  07/22/2012 FL2 transmitted to all facilities within larger geographic area on   Patient informed that his/her managed care company has contracts with or will negotiate with  certain facilities, including the following:     Patient/family informed of bed offers received:  07/22/2012 Patient chooses bed at [facility] Physician recommends and patient chooses bed at    Patient to be transferred to [facility] on 07/23/12   Patient to be transferred to facility by family  The following physician request were entered in Epic:   Additional Comments:  07/23/12 - Discharge summary forwarded to facility. Patient's sister-in-law  transported patient to SNF. Medical packet compiled and accompanied patient to [facility].

## 2012-07-22 NOTE — Progress Notes (Signed)
Physical Therapy Treatment Patient Details Name: Analese Sovine MRN: [id number] DOB: 06-Feb-1961 Today's Date: 07/22/2012 Time: 4098-1191 PT Time Calculation (min): 26 min  PT Assessment / Plan / Recommendation Comments on Treatment Session  Pt continues to improve in mobility, but she still is limited in activity endurance and ability to limit weight bearing on LLE.  She continues to need frequent redirection for safety and attention to task    Follow Up Recommendations  SNF     Does the patient have the potential to tolerate intense rehabilitation     Barriers to Discharge        Equipment Recommendations  Rolling walker with 5" wheels    Recommendations for Other Services    Frequency Min 3X/week   Plan Discharge plan remains appropriate    Precautions / Restrictions Precautions Precautions: Fall Required Braces or Orthoses: Other Brace/Splint Other Brace/Splint: post-op shoe left Restrictions Weight Bearing Restrictions: Yes LLE Weight Bearing: Partial weight bearing LLE Partial Weight Bearing Percentage or Pounds: heel only   Pertinent Vitals/Pain Pt reports some pain in LLE with upright activity    Mobility  Transfers Transfers: Sit to Stand;Stand to Sit Sit to Stand: 6: Modified independent (Device/Increase time);With upper extremity assist;From chair/3-in-1 Stand to Sit: 6: Modified independent (Device/Increase time);To chair/3-in-1;With upper extremity assist Details for Transfer Assistance: Pt using arms to help push up, but does not seem to be limiting weight bearing on LLE Ambulation/Gait Ambulation/Gait Assistance: 4: Min assist Ambulation Distance (Feet): 200 Feet Assistive device: Rolling walker Gait Pattern: Step-to pattern Gait velocity: decreased General Gait Details: Pt appears to be putting more than parital weight on LLE Despite repeated cuing, pt does not push with arms to limit weight bearing.  Pt distracted during treatment session and walked 2  times to the bathroom in addition to walking in hall and performing exercise Stairs: No Wheelchair Mobility Wheelchair Mobility: No    Exercises Other Exercises Other Exercises: repeated sit to stand x 5 reps.  Pt appeared to be fatigued at end of session.    PT Diagnosis:    PT Problem List:   PT Treatment Interventions:     PT Goals Acute Rehab PT Goals PT Goal: Sit to Stand - Progress: Met PT Goal: Stand to Sit - Progress: Met PT Goal: Stand - Progress: Progressing toward goal PT Goal: Ambulate - Progress: Progressing toward goal  Visit Information  Last PT Received On: 07/22/12 Assistance Needed: +1    Subjective Data  Subjective: pt very pleasant and talkative Patient Stated Goal: to go to bathroom   Cognition  Cognition Overall Cognitive Status: Impaired Area of Impairment: Attention Arousal/Alertness: Awake/alert Orientation Level: Appears intact for tasks assessed Behavior During Session: [facility] for tasks performed Current Attention Level: Selective Attention - Other Comments: pt self-distracted Cognition - Other Comments: Pt needs frequent redirection to tasks and for safety.  Pt unable to follow cues to limit weight bearing although she repeatedly states she knows what to do    Balance  Static Standing Balance Static Standing - Balance Support: No upper extremity supported Static Standing - Level of Assistance: 7: Independent Dynamic Standing Balance Dynamic Standing - Balance Support: No upper extremity supported Dynamic Standing - Level of Assistance: 5: Stand by assistance  End of Session PT - End of Session Activity Tolerance: Patient tolerated treatment well Patient left: in chair Nurse Communication: Mobility status   GP     Donnetta Hail 07/22/2012, 2:08 PM

## 2012-07-23 ENCOUNTER — Telehealth: Payer: Self-pay | Admitting: Vascular Surgery

## 2012-07-23 ENCOUNTER — Encounter: Payer: Self-pay | Admitting: Vascular Surgery

## 2012-07-23 DIAGNOSIS — Z5189 Encounter for other specified aftercare: Secondary | ICD-10-CM | POA: Diagnosis not present

## 2012-07-23 DIAGNOSIS — D509 Iron deficiency anemia, unspecified: Secondary | ICD-10-CM | POA: Diagnosis not present

## 2012-07-23 DIAGNOSIS — N2581 Secondary hyperparathyroidism of renal origin: Secondary | ICD-10-CM | POA: Diagnosis not present

## 2012-07-23 DIAGNOSIS — I6330 Cerebral infarction due to thrombosis of unspecified cerebral artery: Secondary | ICD-10-CM | POA: Diagnosis not present

## 2012-07-23 DIAGNOSIS — D631 Anemia in chronic kidney disease: Secondary | ICD-10-CM | POA: Diagnosis not present

## 2012-07-23 DIAGNOSIS — E1159 Type 2 diabetes mellitus with other circulatory complications: Secondary | ICD-10-CM | POA: Diagnosis not present

## 2012-07-23 DIAGNOSIS — N186 End stage renal disease: Secondary | ICD-10-CM | POA: Diagnosis not present

## 2012-07-23 DIAGNOSIS — E119 Type 2 diabetes mellitus without complications: Secondary | ICD-10-CM | POA: Diagnosis not present

## 2012-07-23 DIAGNOSIS — I739 Peripheral vascular disease, unspecified: Secondary | ICD-10-CM | POA: Diagnosis not present

## 2012-07-23 DIAGNOSIS — E1129 Type 2 diabetes mellitus with other diabetic kidney complication: Secondary | ICD-10-CM | POA: Diagnosis not present

## 2012-07-23 DIAGNOSIS — E213 Hyperparathyroidism, unspecified: Secondary | ICD-10-CM | POA: Diagnosis not present

## 2012-07-23 DIAGNOSIS — N912 Amenorrhea, unspecified: Secondary | ICD-10-CM | POA: Diagnosis not present

## 2012-07-23 DIAGNOSIS — J150 Pneumonia due to Klebsiella pneumoniae: Secondary | ICD-10-CM | POA: Diagnosis not present

## 2012-07-23 DIAGNOSIS — I70269 Atherosclerosis of native arteries of extremities with gangrene, unspecified extremity: Secondary | ICD-10-CM | POA: Diagnosis not present

## 2012-07-23 DIAGNOSIS — S98139A Complete traumatic amputation of one unspecified lesser toe, initial encounter: Secondary | ICD-10-CM | POA: Diagnosis not present

## 2012-07-23 DIAGNOSIS — I10 Essential (primary) hypertension: Secondary | ICD-10-CM | POA: Diagnosis not present

## 2012-07-23 DIAGNOSIS — I96 Gangrene, not elsewhere classified: Secondary | ICD-10-CM | POA: Diagnosis not present

## 2012-07-23 LAB — GLUCOSE, CAPILLARY: Glucose-Capillary: 120 mg/dL — ABNORMAL HIGH (ref 70–99)

## 2012-07-23 LAB — RENAL FUNCTION PANEL
Calcium: 9.4 mg/dL (ref 8.4–10.5)
GFR calc Af Amer: 6 mL/min — ABNORMAL LOW (ref >90)
GFR calc non Af Amer: 5 mL/min — ABNORMAL LOW (ref >90)
Phosphorus: 4 mg/dL (ref 2.3–4.6)
Sodium: 134 mEq/L — ABNORMAL LOW (ref 135–145)

## 2012-07-23 LAB — VANCOMYCIN, RANDOM: Vancomycin Rm: 15.7 ug/mL

## 2012-07-23 LAB — CBC
MCV: 81.7 fL (ref 78.0–100.0)
Platelets: 468 10*3/uL — ABNORMAL HIGH (ref 150–400)
RBC: 3.34 MIL/uL — ABNORMAL LOW (ref 3.87–5.11)
RDW: 17.2 % — ABNORMAL HIGH (ref 11.5–15.5)
WBC: 9.7 10*3/uL (ref 4.0–10.5)

## 2012-07-23 MED ORDER — DEXTROSE 5 % IV SOLN: 2.0000 g | INTRAVENOUS | Status: DC

## 2012-07-23 MED ORDER — DARBEPOETIN ALFA-POLYSORBATE 150 MCG/0.3ML IJ SOLN
INTRAMUSCULAR | Status: AC
  Administered 2012-07-23: 150 ug via INTRAVENOUS
  Filled 2012-07-23: qty 0.3

## 2012-07-23 MED ORDER — OXYCODONE HCL 5 MG PO TABS: 5.0000 mg | ORAL_TABLET | ORAL | Status: DC | PRN

## 2012-07-23 MED ORDER — HYDROCODONE-ACETAMINOPHEN 5-325 MG PO TABS: 1.0000 | ORAL_TABLET | Freq: Four times a day (QID) | ORAL | Status: DC | PRN

## 2012-07-23 MED ORDER — ENSURE PUDDING PO PUDG: 1.0000 | Freq: Three times a day (TID) | ORAL | Status: DC

## 2012-07-23 MED ORDER — VANCOMYCIN HCL 1000 MG IV SOLR: 750.0000 mg | INTRAVENOUS | Status: DC

## 2012-07-23 NOTE — Discharge Summary (Signed)
[facility] Discharge Summary   Patient ID:  [name] MRN: [id number] DOB/AGE: 1960-12-12 52 y.o.  Admit date: 07/15/2012 Discharge date: 07/23/2012 Date of Surgery: 07/15/2012 Surgeon: Surgeon(s): Chuck Hint, MD  Admission Diagnosis: gangrene left foot  Discharge Diagnoses:  gangrene left foot  Secondary Diagnoses: Past Medical History  Diagnosis Date  . ESRD (end stage renal disease)   . Type 2 diabetes mellitus   . Hypertension   . CVA (cerebral infarction)     right parietal 05/19/2000  . Vaginal bleeding   . Hyperparathyroidism   . Pneumonia   . Stroke     2002,no residual  . Peripheral vascular disease   . GERD (gastroesophageal reflux disease)   . Anemia     Procedure(s): TRANSMETATARSAL AMPUTATION  Discharged Condition: good  HPI: [name] is a 52 y.o. female which I have seen in consultation on 05/21/2012 with gangrene of the left foot. She had dry gangrene of the left great toe and some blistering on the dorsum of the foot in addition to gangrene of the fourth toe. She had evidence of infrainguinal arterial occlusive disease. She underwent an arteriogram by Dr. Myra Gianotti on 05/27/2012. She was found have a critical left popliteal artery stenosis which was successfully treated with PTCA. She had two-vessel runoff on the left via the posterior tibial and anterior tibial arteries.  Her foot wounds progressed to dry gangrene and it was recommended that she under go a left transmetatarsal amputation.  She was admitted to [facility] 07/15/2012 and had surgery by Dr. Waverly Ferrari.    Hospital Course:  [name] is a 52 y.o. female is S/P Left Procedure(s): TRANSMETATARSAL AMPUTATION Extubated: POD # 0 Physical exam: Left TMA is healing without signs of ischemia and no erythema. SS drainage with retained staples.   Post-op wounds draining or healing well Pt. Ambulating, voiding and taking PO diet without  difficulty. Pt pain controlled with PO pain meds. Labs as below Complications:Poor response post op and somewhat lethargic.  A CT scan of the brain was ordered and was unremarkable.  Back to her baseline mentally.    Consults:  Treatment Team:  Maree Krabbe, MD  Significant Diagnostic Studies: CBC Lab Results  Component Value Date   WBC 9.7 07/23/2012   HGB 8.7* 07/23/2012   HCT 27.3* 07/23/2012   MCV 81.7 07/23/2012   PLT 468* 07/23/2012    BMET    Component Value Date/Time   NA 134* 07/23/2012 0705   K 3.9 07/23/2012 0705   CL 93* 07/23/2012 0705   CO2 24 07/23/2012 0705   GLUCOSE 135* 07/23/2012 0705   BUN 38* 07/23/2012 0705   CREATININE 8.70* 07/23/2012 0705   CALCIUM 9.4 07/23/2012 0705   GFRNONAA 5* 07/23/2012 0705   GFRAA 6* 07/23/2012 0705   COAG Lab Results  Component Value Date   INR 0.9 06/15/2008   INR 1.2 01/23/2007   INR 1.3 01/23/2007     Disposition:  Discharge to :Skilled nursing facility Discharge Orders   Future Orders Complete By Expires     Call MD for:  redness, tenderness, or signs of infection (pain, swelling, bleeding, redness, odor or green/yellow discharge around incision site)  As directed     Call MD for:  severe or increased pain, loss or decreased feeling  in affected limb(s)  As directed     Call MD for:  temperature >100.5  As directed     May shower   As directed  Scheduling Instructions:      Dial soap washes to amputation site daily.    Resume previous diet  As directed     Walk with assistance  As directed     Scheduling Instructions:      Walk with post-op shoe heel weight bearing.        Medication List    TAKE these medications       amLODipine 10 MG tablet  Commonly known as:  NORVASC  Take 10 mg by mouth daily.     aspirin 81 MG tablet  Take 81 mg by mouth daily.     calcium carbonate 500 MG chewable tablet  Commonly known as:  TUMS - dosed in mg elemental calcium  Chew 2 tablets by mouth 3 (three) times daily with meals.      diazepam 2 MG tablet  Commonly known as:  VALIUM  Take 2 mg by mouth every 6 (six) hours as needed. For muscle spasms     esomeprazole 40 MG capsule  Commonly known as:  NEXIUM  Take 40 mg by mouth daily as needed. For heartburn     feeding supplement Pudg  Take 1 Container by mouth 3 (three) times daily between meals.     glipiZIDE 10 MG tablet  Commonly known as:  GLUCOTROL  Take 10 mg by mouth 2 (two) times daily before a meal.     HYDROcodone-acetaminophen 5-325 MG per tablet  Commonly known as:  NORCO/VICODIN  Take 1 tablet by mouth every 6 (six) hours as needed. For pain     LIDOCAINE EX  Apply 1 application topically See admin instructions. Use 1 hour prior to dialysis as needed for pain on Mon, Wed and Fri.     multivitamin Tabs tablet  Take 1 tablet by mouth daily.     TYLENOL PO  Take 1-2 tablets by mouth See admin instructions. Uses at dialysis Mon, Wed, Fridays each week for muscle spasms       Verbal and written Discharge instructions given to the patient. Wound care per Discharge AVS  Dry Gangrene of Left Foot - TMA 3/25 by Dr. Edilia Bo. Still with some drainage, and still significant risk of not healing and requiring Left BKA per notes. BC's negative per final report. On empiric Vanc/Zosyn. Will continue on HD after discharge per [facility]VS recommendations.  Office visits with Dr. Edilia Bo will dictated length of antibiotics.   SignedMosetta Pigeon 07/23/2012, 8:16 AM

## 2012-07-23 NOTE — Progress Notes (Signed)
VASCULAR PROGRESS NOTE  SUBJECTIVE: No complaints  PHYSICAL EXAM: Filed Vitals:   07/23/12 0526 07/23/12 0635 07/23/12 0700 07/23/12 0730  BP: 138/74 131/83 154/83 154/83  Pulse: 94 95 98 101  Temp: 97.9 F (36.6 C) 96.8 F (36 C) 96.8 F (36 C)   TempSrc: Oral Oral Oral   Resp: 18 12 12 18   Height:      Weight:  153 lb 7 oz (69.6 kg)    SpO2: 100% 98%     Left TMA inspected. There is some drainage, but the outside of the TMA looks fine. It does not look ischemic and there is not erythema.   LABS: Lab Results  Component Value Date   WBC 10.6* 07/21/2012   HGB 8.8* 07/21/2012   HCT 27.6* 07/21/2012   MCV 81.7 07/21/2012   PLT 449* 07/21/2012   Lab Results  Component Value Date   CREATININE 8.44* 07/21/2012   Lab Results  Component Value Date   INR 0.9 06/15/2008   CBG (last 3)   Recent Labs  07/22/12 1208 07/22/12 1633 07/22/12 2155  GLUCAP 296* 228* 178*   ASSESSMENT AND PLAN: 1. 8 Days Post-Op s/p: Left TMA.  2. I still do not think that she is out of the woods with the left TMA, but given that the amp site currently looks good, I think that it is best to give her the benefit of the doubt. I will follow this as an out-pt.  3. She will continue to get IV ABx at the time of dialysis.   Cari Caraway Beeper: [phone] 07/23/2012

## 2012-07-23 NOTE — Procedures (Signed)
Pt seen on HD.  Ap 150 Vp 270.  Note possible DC today.  Will plan to cont AB for another week unless otherwise instructed by [facility].

## 2012-07-23 NOTE — Telephone Encounter (Addendum)
Message copied by Shari Prows on Wed Jul 23, 2012 12:11 PM ------      Message from: Melene Plan      Created: Wed Jul 23, 2012  9:26 AM                   ----- Message -----         From: Lars Mage, PA-C         Sent: 07/23/2012   8:14 AM           To: Melene Plan, RN            Dr. Edilia Bo F/U in 1 week for wound check and possible staple removal.  S/P trans met amputation.  Going to SNF will receive Fortaz and vancomycin antibiotics IV at dialysis 3 times a week. ------ I scheduled an appt for the above pt on 07/30/12 at 1pm w/ [facility]. I mailed an appt letter and also left a voice mail message/awt

## 2012-07-23 NOTE — Progress Notes (Signed)
ANTIBIOTIC CONSULT NOTE - Follow-up  Pharmacy Consult for Vancomycin + Ceftazidime (per MD) Indication: r/o sepsis, s/p L transmetatarsal amputation   Allergies  Allergen Reactions  . Doxycycline Other (See Comments)    Unknown reaction  . Peroxyl (Hydrogen Peroxide) Hives and Rash    Patient Measurements: Height: 5\' 2"  (157.5 cm) Weight: 153 lb 7 oz (69.6 kg) IBW/kg (Calculated) : 50.1   Vital Signs: Temp: 96.8 F (36 C) (04/02 0700) Temp src: Oral (04/02 0700) BP: 133/77 mmHg (04/02 1000) Pulse Rate: 107 (04/02 1000) Intake/Output from previous day: 04/01 0701 - 04/02 0700 In: 240 [P.O.:240] Out: -  Intake/Output from this shift:    Labs:  Recent Labs  07/21/12 0645 07/23/12 0705  WBC 10.6* 9.7  HGB 8.8* 8.7*  PLT 449* 468*  CREATININE 8.44* 8.70*   Estimated Creatinine Clearance: 7.1 ml/min (by C-G formula based on Cr of 8.7).  Recent Labs  07/23/12 0705  [facility] 15.7     Microbiology: Recent Results (from the past 720 hour(s))  SURGICAL PCR SCREEN     Status: None   Collection Time    07/15/12 12:01 PM      Result Value Range Status   MRSA, PCR NEGATIVE  NEGATIVE Final   Staphylococcus aureus NEGATIVE  NEGATIVE Final   Comment:            The Xpert SA Assay (FDA     approved for NASAL specimens     in patients over 2 years of age),     is one component of     a comprehensive surveillance     program.  Test performance has     been validated by [employer] for patients greater     than or equal to 29 year old.     It is not intended     to diagnose infection nor to     guide or monitor treatment.  ANAEROBIC CULTURE     Status: None   Collection Time    07/15/12  2:05 PM      Result Value Range Status   Specimen Description WOUND LEFT FOOT   Final   Special Requests SWAB FROM LEFT FOOT AMPUTATION SITE PT ON ZINACEF   Final   Gram Stain     Final   Value: FEW WBC PRESENT,BOTH PMN AND MONONUCLEAR     RARE SQUAMOUS EPITHELIAL  CELLS PRESENT     FEW GRAM NEGATIVE RODS     FEW GRAM POSITIVE COCCI IN PAIRS   Culture     Final   Value: ABUNDANT BACTEROIDES FRAGILIS     Note: BETA LACTAMASE POSITIVE   Report Status 07/22/2012 FINAL   Final  WOUND CULTURE     Status: None   Collection Time    07/15/12  2:08 PM      Result Value Range Status   Specimen Description WOUND LEFT FOOT   Final   Special Requests SWAB FROM LEFT FOOT AMPUTATION SITE PT ON ZINACEF   Final   Gram Stain     Final   Value: FEW WBC PRESENT,BOTH PMN AND MONONUCLEAR     RARE SQUAMOUS EPITHELIAL CELLS PRESENT     FEW GRAM NEGATIVE RODS     FEW GRAM POSITIVE COCCI IN PAIRS   Culture NO GROWTH 3 DAYS   Final   Report Status 07/18/2012 FINAL   Final  CULTURE, BLOOD (ROUTINE X 2)     Status: None  Collection Time    07/16/12  9:40 AM      Result Value Range Status   Specimen Description BLOOD RIGHT HAND   Final   Special Requests BOTTLES DRAWN AEROBIC ONLY [facility]   Final   Culture  Setup Time 07/16/2012 15:37   Final   Culture NO GROWTH 5 DAYS   Final   Report Status 07/22/2012 FINAL   Final  CULTURE, BLOOD (ROUTINE X 2)     Status: None   Collection Time    07/16/12  9:50 AM      Result Value Range Status   Specimen Description BLOOD RIGHT HAND   Final   Special Requests BOTTLES DRAWN AEROBIC ONLY [facility]   Final   Culture  Setup Time 07/16/2012 15:37   Final   Culture NO GROWTH 5 DAYS   Final   Report Status 07/22/2012 FINAL   Final      Assessment: 52 y.o. F started empirically on Vancomycin + Ceftazidime per MD for r/o sepsis after spiking fevers s/p left transmetatarsal amputation on 3/25. The patient is now afebrile, WBC 9.7<< 10.6 << 14.5.   Pre-HD vanc level 15.7 mcg/ml this am.  Level within goal range. Plan to DC today with 1 more week of IV vanc and fortaz after HD 3 x a week.   Goal of Therapy:  Pre-HD Vancomycin level of 15-25 mcg/ml  Plan:  1. Continue Vancomycin 750 mg post HD sessions on M/W/F 2. contininue ceftaz 2 gm  post HD session on M/W/F. Herby Abraham, Pharm.D. [phone] 07/23/2012 10:18 AM

## 2012-07-23 NOTE — Discharge Summary (Signed)
Agree with plans for D/C today.  Waverly Ferrari, MD, FACS Beeper [phone] 07/23/2012

## 2012-07-23 NOTE — Progress Notes (Signed)
FYI ABOUT ANTIBIOTICS ----- Message ----- From: Lars Mage, PA-C Sent: 07/23/2012 8:14 AM To: Melene Plan, RN Dr. Edilia Bo F/U in 1 week for wound check and possible staple removal. S/P trans met amputation. Going to SNF will receive Fortaz and vancomycin antibiotics IV at dialysis 3 times a week.   Added to patient's chart from staff message sent from [place].

## 2012-07-25 DIAGNOSIS — N186 End stage renal disease: Secondary | ICD-10-CM | POA: Diagnosis not present

## 2012-07-25 DIAGNOSIS — N2581 Secondary hyperparathyroidism of renal origin: Secondary | ICD-10-CM | POA: Diagnosis not present

## 2012-07-25 DIAGNOSIS — N039 Chronic nephritic syndrome with unspecified morphologic changes: Secondary | ICD-10-CM | POA: Diagnosis not present

## 2012-07-25 DIAGNOSIS — I96 Gangrene, not elsewhere classified: Secondary | ICD-10-CM | POA: Diagnosis not present

## 2012-07-25 DIAGNOSIS — D509 Iron deficiency anemia, unspecified: Secondary | ICD-10-CM | POA: Diagnosis not present

## 2012-07-28 ENCOUNTER — Other Ambulatory Visit: Payer: Self-pay | Admitting: *Deleted

## 2012-07-28 DIAGNOSIS — D509 Iron deficiency anemia, unspecified: Secondary | ICD-10-CM | POA: Diagnosis not present

## 2012-07-28 DIAGNOSIS — N2581 Secondary hyperparathyroidism of renal origin: Secondary | ICD-10-CM | POA: Diagnosis not present

## 2012-07-28 DIAGNOSIS — D631 Anemia in chronic kidney disease: Secondary | ICD-10-CM | POA: Diagnosis not present

## 2012-07-28 DIAGNOSIS — N186 End stage renal disease: Secondary | ICD-10-CM | POA: Diagnosis not present

## 2012-07-28 DIAGNOSIS — I96 Gangrene, not elsewhere classified: Secondary | ICD-10-CM | POA: Diagnosis not present

## 2012-07-28 MED ORDER — HYDROCODONE-ACETAMINOPHEN 5-325 MG PO TABS: ORAL_TABLET | ORAL | Status: DC

## 2012-07-28 MED ORDER — DIAZEPAM 2 MG PO TABS: ORAL_TABLET | ORAL | Status: DC

## 2012-07-29 ENCOUNTER — Encounter: Payer: Self-pay | Admitting: Vascular Surgery

## 2012-07-30 ENCOUNTER — Ambulatory Visit (INDEPENDENT_AMBULATORY_CARE_PROVIDER_SITE_OTHER): Payer: Medicare Other | Admitting: Vascular Surgery

## 2012-07-30 ENCOUNTER — Encounter: Payer: Self-pay | Admitting: Vascular Surgery

## 2012-07-30 ENCOUNTER — Non-Acute Institutional Stay (SKILLED_NURSING_FACILITY): Payer: Medicare Other | Admitting: Internal Medicine

## 2012-07-30 VITALS — BP 179/93 | HR 109 | Ht 62.0 in | Wt 147.0 lb

## 2012-07-30 DIAGNOSIS — N186 End stage renal disease: Secondary | ICD-10-CM

## 2012-07-30 DIAGNOSIS — I70269 Atherosclerosis of native arteries of extremities with gangrene, unspecified extremity: Secondary | ICD-10-CM

## 2012-07-30 DIAGNOSIS — D509 Iron deficiency anemia, unspecified: Secondary | ICD-10-CM | POA: Diagnosis not present

## 2012-07-30 DIAGNOSIS — E1151 Type 2 diabetes mellitus with diabetic peripheral angiopathy without gangrene: Secondary | ICD-10-CM

## 2012-07-30 DIAGNOSIS — I739 Peripheral vascular disease, unspecified: Secondary | ICD-10-CM

## 2012-07-30 DIAGNOSIS — D631 Anemia in chronic kidney disease: Secondary | ICD-10-CM | POA: Diagnosis not present

## 2012-07-30 DIAGNOSIS — E1159 Type 2 diabetes mellitus with other circulatory complications: Secondary | ICD-10-CM | POA: Diagnosis not present

## 2012-07-30 DIAGNOSIS — N2581 Secondary hyperparathyroidism of renal origin: Secondary | ICD-10-CM | POA: Diagnosis not present

## 2012-07-30 DIAGNOSIS — I798 Other disorders of arteries, arterioles and capillaries in diseases classified elsewhere: Secondary | ICD-10-CM

## 2012-07-30 DIAGNOSIS — I96 Gangrene, not elsewhere classified: Secondary | ICD-10-CM | POA: Diagnosis not present

## 2012-07-30 NOTE — Progress Notes (Signed)
[facility] of [facility]  Patient name: [name] MRN: [id number] DOB: 1960/12/21 Sex: female  REASON FOR VISIT: follow up of left transmetatarsal amputation.  HPI: [name] is a 52 y.o. female who had presented with gangrene of her left forefoot. She underwent endovascular revascularization by Dr. Myra Gianotti. She had a high-grade 95% stenosis of the left popliteal artery just proximal to the joint space which was successfully treated with a 4 mm by or he millimeter balloon. She had two-vessel runoff via the posterior tibial artery and anterior tibial artery. She subsequently had a left transmetatarsal amputation on 07/15/2012 to she initially had some drainage from the wound but this stopped and she has not had fever or chills. She comes in for a wound check. His been no significant drainage from the wound.    REVIEW OF SYSTEMS: Arly.Keller ] denotes positive finding; [  ] denotes negative finding  CARDIOVASCULAR:  [ ]  chest pain   [ ]  dyspnea on exertion    CONSTITUTIONAL:  [ ]  fever   [ ]  chills  PHYSICAL EXAM: Filed Vitals:   07/30/12 1447  BP: 179/93  Pulse: 109  Height: 5\' 2"  (1.575 m)  Weight: 147 lb (66.679 kg)  SpO2: 100%   Body mass index is 26.88 kg/(m^2). GENERAL: The patient is a well-nourished female, in no acute distress. The vital signs are documented above. CARDIOVASCULAR: There is a regular rate and rhythm  PULMONARY: There is good air exchange bilaterally without wheezing or rales. There is some separation of the incision and I removed her staples today where they were not helping. He did not tolerate much probing of the wound so far I think there is a reasonable chance of this healing.  MEDICAL ISSUES: I've instructed the skilled nursing facility to soak the foot daily and lukewarm bowel soap soaks and then keep a dry dressing on this wound. I'll plan on seeing her back in 3 weeks. She knows to call sooner if she has problems.  DICKSON,CHRISTOPHER  S [facility] [place] Beeper: [phone]

## 2012-08-01 DIAGNOSIS — N186 End stage renal disease: Secondary | ICD-10-CM | POA: Diagnosis not present

## 2012-08-01 DIAGNOSIS — N039 Chronic nephritic syndrome with unspecified morphologic changes: Secondary | ICD-10-CM | POA: Diagnosis not present

## 2012-08-01 DIAGNOSIS — N2581 Secondary hyperparathyroidism of renal origin: Secondary | ICD-10-CM | POA: Diagnosis not present

## 2012-08-01 DIAGNOSIS — D509 Iron deficiency anemia, unspecified: Secondary | ICD-10-CM | POA: Diagnosis not present

## 2012-08-01 DIAGNOSIS — I96 Gangrene, not elsewhere classified: Secondary | ICD-10-CM | POA: Diagnosis not present

## 2012-08-03 ENCOUNTER — Encounter: Payer: Self-pay | Admitting: Internal Medicine

## 2012-08-03 DIAGNOSIS — E1151 Type 2 diabetes mellitus with diabetic peripheral angiopathy without gangrene: Secondary | ICD-10-CM | POA: Insufficient documentation

## 2012-08-03 DIAGNOSIS — Z992 Dependence on renal dialysis: Secondary | ICD-10-CM | POA: Insufficient documentation

## 2012-08-03 DIAGNOSIS — N186 End stage renal disease: Secondary | ICD-10-CM | POA: Insufficient documentation

## 2012-08-03 NOTE — Assessment & Plan Note (Signed)
S/p left transmetatarsal amputation for gangrene.  Healing slowly.  Will f/u again with Dr. Edilia Bo as planned.  New treatment orders were verified today.

## 2012-08-03 NOTE — Assessment & Plan Note (Signed)
hba1c a bit high for someone with so many comorbidities.  She is at high risk for more limb loss, visual decline, MI, recurrent stroke.  Would encourage increased dietary adherence.  Has been exercising here with stationary bike.  Is on glipizide alone, asa 81mg .  Also monitor carefully for hypoglycemia with this med and irregular eating with HD.  Options are quite limited with her renal disease.

## 2012-08-03 NOTE — Progress Notes (Signed)
Patient ID: [name], female   DOB: [birth year], 52 y.o.   MRN: [id number]   PCP: Sadie Haber, MD  Code Status: full code  Allergies  Allergen Reactions  . Doxycycline Other (See Comments)    Unknown reaction  . 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 Edilia Bo on 07/15/12.    When seen, she had just returned from her f/u appointment with him where some of the sutures were removed, but the wound had not healed enough to remove them all.  She feels well aside from fatigue and being hungry b/c she had to rush off to her appointment after therapy.  She denies pain.    Review of Systems:  Review of Systems  Constitutional: Positive for malaise/fatigue. Negative for fever and chills.  HENT: Negative for congestion and sore throat.   Eyes: Negative for blurred vision.  Respiratory: Negative for cough, sputum production and shortness of breath.   Cardiovascular: Negative for chest pain, palpitations, orthopnea, leg swelling and PND.  Gastrointestinal: Negative for nausea, vomiting, abdominal pain, diarrhea and constipation.  Genitourinary: Negative for flank pain.  Musculoskeletal: Negative for falls.  Skin: Negative for rash.  Neurological: Positive for sensory change. Negative for focal weakness, weakness and headaches.  Psychiatric/Behavioral: Negative for depression and memory loss. The patient does not have insomnia.      Past Medical History  Diagnosis Date  . ESRD (end stage renal disease)   . Hypertension   . CVA (cerebral infarction)     right parietal 05/19/2000  . Vaginal bleeding   . Hyperparathyroidism   . Pneumonia   . Stroke     2002,no  residual  . Peripheral vascular disease   . GERD (gastroesophageal reflux disease)   . Anemia   . DM (diabetes mellitus) type II controlled peripheral vascular disorder    Past Surgical History  Procedure Laterality Date  . Parathyroidectomy with autotransplant  12/07/2010  . Knee surgery      right, x2  . Brachial artery graft      x2 for dialysis  . Av fistula placement      upper rt thigh  . Dental extractions Bilateral   . Dilation and curettage of uterus      hx: of 1986  . Transmetatarsal amputation Left 07/15/2012    Procedure: TRANSMETATARSAL AMPUTATION;  Surgeon: Chuck Hint, MD;  Location: [facility] OR;  Service: Vascular;  Laterality: Left;   Social History:   reports that she has never smoked. She has never used smokeless tobacco. She reports that  drinks alcohol. She reports that she does not use illicit drugs.  Family History  Problem Relation Age of Onset  . Hypertension Father   . Kidney disease Mother     Medications: Patient's Medications  New Prescriptions   No medications on file  Previous Medications   ACETAMINOPHEN (TYLENOL PO)    Take 1-2 tablets by mouth See admin instructions. Uses at dialysis Mon, Wed, Fridays each week for muscle spasms   AMLODIPINE (NORVASC) 10 MG TABLET    Take 10 mg by mouth daily.     ASPIRIN 81 MG TABLET    Take 81 mg by mouth daily.  CALCIUM CARBONATE (TUMS - DOSED IN MG ELEMENTAL CALCIUM) 500 MG CHEWABLE TABLET    Chew 2 tablets by mouth 3 (three) times daily with meals.    DEXTROSE 5 % SOLN 50 ML WITH CEFTAZIDIME 2 G SOLR 2 G    Inject 2 g into the vein every Monday, Wednesday, and Friday at 8 PM.   DIAZEPAM (VALIUM) 2 MG TABLET    Take one tablet every 6 hours as needed   ESOMEPRAZOLE (NEXIUM) 40 MG CAPSULE    Take 40 mg by mouth daily as needed. For heartburn   FEEDING SUPPLEMENT (ENSURE) PUDG    Take 1 Container by mouth 3 (three) times daily between meals.   GLIPIZIDE (GLUCOTROL) 10 MG TABLET    Take 10 mg by mouth  2 (two) times daily before a meal.    HYDROCODONE-ACETAMINOPHEN (NORCO/VICODIN) 5-325 MG PER TABLET    Take 1 tablet every 6 hours as needed for moderate pain   LIDOCAINE EX    Apply 1 application topically See admin instructions. Use 1 hour prior to dialysis as needed for pain on Mon, Wed and Fri.   MULTIVITAMIN (RENA-VIT) TABS TABLET    Take 1 tablet by mouth daily.   OXYCODONE (ROXICODONE) 5 MG IMMEDIATE RELEASE TABLET    Take 1 tablet (5 mg total) by mouth every 4 (four) hours as needed for pain.   SODIUM CHLORIDE 0.9 % SOLN 150 ML WITH VANCOMYCIN 1000 MG SOLR 750 MG    Inject 750 mg into the vein every Monday, Wednesday, and Friday with hemodialysis.  Modified Medications   No medications on file  Discontinued Medications   No medications on file   Physical Exam: Filed Vitals:   07/30/12 1513  BP: 130/74  Pulse: 107  Temp: 97.3 F (36.3 C)  Resp: 20  Weight: 161 lb 3.2 oz (73.12 kg)  SpO2: 98%   Physical Exam  Nursing note and vitals reviewed. Constitutional: She is oriented to person, place, and time. She appears well-developed and well-nourished. No distress.  AA female  HENT:  Head: Normocephalic and atraumatic.  Right Ear: External ear normal.  Left Ear: External ear normal.  Nose: Nose normal.  Mouth/Throat: Oropharynx is clear and moist. No oropharyngeal exudate.  Eyes: Conjunctivae and EOM are normal. Pupils are equal, round, and reactive to light. Right eye exhibits no discharge. Left eye exhibits no discharge. No scleral icterus.  Neck: Normal range of motion. Neck supple. No JVD present. No tracheal deviation present.  Cardiovascular: Regular rhythm and normal heart sounds.   Tachycardic  Right femoral HD access with normal bruit and thrill present  Pulmonary/Chest: Effort normal and breath sounds normal. No respiratory distress. She has no rales.  Abdominal: Soft. Bowel sounds are normal. She exhibits no distension and no mass. There is no tenderness.   Musculoskeletal: She exhibits no tenderness.  Left transmetatarsal amputation present--currently dressed and in boot  Neurological: She is alert and oriented to person, place, and time. She displays normal reflexes. No cranial nerve deficit. She exhibits normal muscle tone. Coordination normal.  Skin: Skin is warm and dry. She is not diaphoretic. No erythema.  Psychiatric: She has a normal mood and affect. Her behavior is normal. Judgment and thought content normal.   Labs reviewed: Basic Metabolic Panel:  Recent Labs  16/10/96 0640 07/21/12 0645 07/23/12 0705  NA 133* 129* 134*  K 3.9 4.4 3.9  CL 93* 89* 93*  CO2 27 24 24   GLUCOSE 199* 169* 135*  BUN  19 38* 38*  CREATININE 6.38* 8.44* 8.70*  CALCIUM 11.4* 10.0 9.4  PHOS  --  3.3 4.0   Liver Function Tests:  Recent Labs  07/21/12 0645 07/23/12 0705  ALBUMIN 2.8* 2.9*   No results found for this basename: LIPASE, AMYLASE,  in the last 8760 hours No results found for this basename: AMMONIA,  in the last 8760 hours CBC:  Recent Labs  04/21/12 1520  07/17/12 0640 07/21/12 0645 07/23/12 0705  WBC 11.1*  < > 14.5* 10.6* 9.7  NEUTROABS 8.6*  --   --   --   --   HGB 8.7*  < > 9.0* 8.8* 8.7*  HCT 27.3*  < > 29.1* 27.6* 27.3*  MCV 93.2  < > 84.8 81.7 81.7  PLT 283  < > 407* 449* 468*  < > = values in this interval not displayed. CBG:  Recent Labs  07/22/12 1633 07/22/12 2155 07/23/12 1133  GLUCAP 228* 178* 120*   Radiological Exams: 04/21/12:  Left foot xrays 3 views:  1. No acute osseous abnormality identified about the left foot.  2. Severe calcified peripheral vascular disease.   07/16/12:  CT brain:  Atrophy with prior right occipitoparietal junction infarct. Patchy  small vessel disease in the centra semiovale bilaterally. No  evidence of mass, hemorrhage, or acute appearing infarct.  Assessment/Plan Atherosclerosis of native arteries of the extremities with gangrene S/p left transmetatarsal amputation  for gangrene.  Healing slowly.  Will f/u again with Dr. Edilia Bo as planned.  New treatment orders were verified today.    Peripheral vascular disease, unspecified High risk for ulceration, gangrene.  Monitor skin carefully.  Avoid pressure areas.  DM (diabetes mellitus) type II controlled peripheral vascular disorder hba1c a bit high for someone with so many comorbidities.  She is at high risk for more limb loss, visual decline, MI, recurrent stroke.  Would encourage increased dietary adherence.  Has been exercising here with stationary bike.  Is on glipizide alone, asa 81mg .  Also monitor carefully for hypoglycemia with this med and irregular eating with HD.  Options are quite limited with her renal disease.  Rehab potential:  good  Family/ staff Communication: pt is alert and oriented.  I spoke with her and reviewed her plan of care with nursing staff.  Goals of care: full code

## 2012-08-03 NOTE — Assessment & Plan Note (Signed)
High risk for ulceration, gangrene.  Monitor skin carefully.  Avoid pressure areas.

## 2012-08-04 DIAGNOSIS — N186 End stage renal disease: Secondary | ICD-10-CM | POA: Diagnosis not present

## 2012-08-04 DIAGNOSIS — N2581 Secondary hyperparathyroidism of renal origin: Secondary | ICD-10-CM | POA: Diagnosis not present

## 2012-08-04 DIAGNOSIS — I96 Gangrene, not elsewhere classified: Secondary | ICD-10-CM | POA: Diagnosis not present

## 2012-08-04 DIAGNOSIS — D631 Anemia in chronic kidney disease: Secondary | ICD-10-CM | POA: Diagnosis not present

## 2012-08-04 DIAGNOSIS — N039 Chronic nephritic syndrome with unspecified morphologic changes: Secondary | ICD-10-CM | POA: Diagnosis not present

## 2012-08-04 DIAGNOSIS — D509 Iron deficiency anemia, unspecified: Secondary | ICD-10-CM | POA: Diagnosis not present

## 2012-08-06 DIAGNOSIS — N039 Chronic nephritic syndrome with unspecified morphologic changes: Secondary | ICD-10-CM | POA: Diagnosis not present

## 2012-08-06 DIAGNOSIS — I96 Gangrene, not elsewhere classified: Secondary | ICD-10-CM | POA: Diagnosis not present

## 2012-08-06 DIAGNOSIS — N2581 Secondary hyperparathyroidism of renal origin: Secondary | ICD-10-CM | POA: Diagnosis not present

## 2012-08-06 DIAGNOSIS — N186 End stage renal disease: Secondary | ICD-10-CM | POA: Diagnosis not present

## 2012-08-06 DIAGNOSIS — D509 Iron deficiency anemia, unspecified: Secondary | ICD-10-CM | POA: Diagnosis not present

## 2012-08-08 DIAGNOSIS — N039 Chronic nephritic syndrome with unspecified morphologic changes: Secondary | ICD-10-CM | POA: Diagnosis not present

## 2012-08-08 DIAGNOSIS — D509 Iron deficiency anemia, unspecified: Secondary | ICD-10-CM | POA: Diagnosis not present

## 2012-08-08 DIAGNOSIS — N186 End stage renal disease: Secondary | ICD-10-CM | POA: Diagnosis not present

## 2012-08-08 DIAGNOSIS — I96 Gangrene, not elsewhere classified: Secondary | ICD-10-CM | POA: Diagnosis not present

## 2012-08-08 DIAGNOSIS — N2581 Secondary hyperparathyroidism of renal origin: Secondary | ICD-10-CM | POA: Diagnosis not present

## 2012-08-11 DIAGNOSIS — I96 Gangrene, not elsewhere classified: Secondary | ICD-10-CM | POA: Diagnosis not present

## 2012-08-11 DIAGNOSIS — N186 End stage renal disease: Secondary | ICD-10-CM | POA: Diagnosis not present

## 2012-08-11 DIAGNOSIS — N2581 Secondary hyperparathyroidism of renal origin: Secondary | ICD-10-CM | POA: Diagnosis not present

## 2012-08-11 DIAGNOSIS — D509 Iron deficiency anemia, unspecified: Secondary | ICD-10-CM | POA: Diagnosis not present

## 2012-08-11 DIAGNOSIS — D631 Anemia in chronic kidney disease: Secondary | ICD-10-CM | POA: Diagnosis not present

## 2012-08-13 DIAGNOSIS — D509 Iron deficiency anemia, unspecified: Secondary | ICD-10-CM | POA: Diagnosis not present

## 2012-08-13 DIAGNOSIS — N186 End stage renal disease: Secondary | ICD-10-CM | POA: Diagnosis not present

## 2012-08-13 DIAGNOSIS — N2581 Secondary hyperparathyroidism of renal origin: Secondary | ICD-10-CM | POA: Diagnosis not present

## 2012-08-13 DIAGNOSIS — I96 Gangrene, not elsewhere classified: Secondary | ICD-10-CM | POA: Diagnosis not present

## 2012-08-13 DIAGNOSIS — N039 Chronic nephritic syndrome with unspecified morphologic changes: Secondary | ICD-10-CM | POA: Diagnosis not present

## 2012-08-14 ENCOUNTER — Other Ambulatory Visit: Payer: Self-pay | Admitting: *Deleted

## 2012-08-14 MED ORDER — OXYCODONE HCL 5 MG PO TABS: 5.0000 mg | ORAL_TABLET | ORAL | Status: DC | PRN

## 2012-08-15 DIAGNOSIS — I96 Gangrene, not elsewhere classified: Secondary | ICD-10-CM | POA: Diagnosis not present

## 2012-08-15 DIAGNOSIS — D509 Iron deficiency anemia, unspecified: Secondary | ICD-10-CM | POA: Diagnosis not present

## 2012-08-15 DIAGNOSIS — D631 Anemia in chronic kidney disease: Secondary | ICD-10-CM | POA: Diagnosis not present

## 2012-08-15 DIAGNOSIS — N186 End stage renal disease: Secondary | ICD-10-CM | POA: Diagnosis not present

## 2012-08-15 DIAGNOSIS — N2581 Secondary hyperparathyroidism of renal origin: Secondary | ICD-10-CM | POA: Diagnosis not present

## 2012-08-18 DIAGNOSIS — I96 Gangrene, not elsewhere classified: Secondary | ICD-10-CM | POA: Diagnosis not present

## 2012-08-18 DIAGNOSIS — D509 Iron deficiency anemia, unspecified: Secondary | ICD-10-CM | POA: Diagnosis not present

## 2012-08-18 DIAGNOSIS — N039 Chronic nephritic syndrome with unspecified morphologic changes: Secondary | ICD-10-CM | POA: Diagnosis not present

## 2012-08-18 DIAGNOSIS — N186 End stage renal disease: Secondary | ICD-10-CM | POA: Diagnosis not present

## 2012-08-18 DIAGNOSIS — N2581 Secondary hyperparathyroidism of renal origin: Secondary | ICD-10-CM | POA: Diagnosis not present

## 2012-08-19 ENCOUNTER — Encounter: Payer: Self-pay | Admitting: Adult Health

## 2012-08-19 ENCOUNTER — Non-Acute Institutional Stay (SKILLED_NURSING_FACILITY): Payer: Medicare Other | Admitting: Adult Health

## 2012-08-19 DIAGNOSIS — I70269 Atherosclerosis of native arteries of extremities with gangrene, unspecified extremity: Secondary | ICD-10-CM

## 2012-08-19 DIAGNOSIS — Z992 Dependence on renal dialysis: Secondary | ICD-10-CM

## 2012-08-19 DIAGNOSIS — N186 End stage renal disease: Secondary | ICD-10-CM | POA: Diagnosis not present

## 2012-08-19 DIAGNOSIS — I739 Peripheral vascular disease, unspecified: Secondary | ICD-10-CM

## 2012-08-19 DIAGNOSIS — I798 Other disorders of arteries, arterioles and capillaries in diseases classified elsewhere: Secondary | ICD-10-CM

## 2012-08-19 DIAGNOSIS — E1159 Type 2 diabetes mellitus with other circulatory complications: Secondary | ICD-10-CM | POA: Diagnosis not present

## 2012-08-19 DIAGNOSIS — E1151 Type 2 diabetes mellitus with diabetic peripheral angiopathy without gangrene: Secondary | ICD-10-CM

## 2012-08-20 DIAGNOSIS — N2581 Secondary hyperparathyroidism of renal origin: Secondary | ICD-10-CM | POA: Diagnosis not present

## 2012-08-20 DIAGNOSIS — N186 End stage renal disease: Secondary | ICD-10-CM | POA: Diagnosis not present

## 2012-08-20 DIAGNOSIS — D509 Iron deficiency anemia, unspecified: Secondary | ICD-10-CM | POA: Diagnosis not present

## 2012-08-20 DIAGNOSIS — D631 Anemia in chronic kidney disease: Secondary | ICD-10-CM | POA: Diagnosis not present

## 2012-08-20 DIAGNOSIS — I96 Gangrene, not elsewhere classified: Secondary | ICD-10-CM | POA: Diagnosis not present

## 2012-08-23 DIAGNOSIS — N186 End stage renal disease: Secondary | ICD-10-CM | POA: Diagnosis not present

## 2012-08-23 DIAGNOSIS — Z8701 Personal history of pneumonia (recurrent): Secondary | ICD-10-CM | POA: Diagnosis not present

## 2012-08-23 DIAGNOSIS — S98139A Complete traumatic amputation of one unspecified lesser toe, initial encounter: Secondary | ICD-10-CM | POA: Diagnosis not present

## 2012-08-23 DIAGNOSIS — I120 Hypertensive chronic kidney disease with stage 5 chronic kidney disease or end stage renal disease: Secondary | ICD-10-CM | POA: Diagnosis not present

## 2012-08-23 DIAGNOSIS — I739 Peripheral vascular disease, unspecified: Secondary | ICD-10-CM | POA: Diagnosis not present

## 2012-08-23 DIAGNOSIS — Z4789 Encounter for other orthopedic aftercare: Secondary | ICD-10-CM | POA: Diagnosis not present

## 2012-08-23 DIAGNOSIS — R269 Unspecified abnormalities of gait and mobility: Secondary | ICD-10-CM | POA: Diagnosis not present

## 2012-08-23 DIAGNOSIS — Z992 Dependence on renal dialysis: Secondary | ICD-10-CM | POA: Diagnosis not present

## 2012-08-23 DIAGNOSIS — E119 Type 2 diabetes mellitus without complications: Secondary | ICD-10-CM | POA: Diagnosis not present

## 2012-08-23 DIAGNOSIS — F411 Generalized anxiety disorder: Secondary | ICD-10-CM | POA: Diagnosis not present

## 2012-08-23 DIAGNOSIS — Z8673 Personal history of transient ischemic attack (TIA), and cerebral infarction without residual deficits: Secondary | ICD-10-CM | POA: Diagnosis not present

## 2012-08-23 DIAGNOSIS — Z4801 Encounter for change or removal of surgical wound dressing: Secondary | ICD-10-CM | POA: Diagnosis not present

## 2012-08-23 DIAGNOSIS — S98119A Complete traumatic amputation of unspecified great toe, initial encounter: Secondary | ICD-10-CM | POA: Diagnosis not present

## 2012-08-26 ENCOUNTER — Encounter: Payer: Self-pay | Admitting: Vascular Surgery

## 2012-08-26 DIAGNOSIS — N186 End stage renal disease: Secondary | ICD-10-CM | POA: Diagnosis not present

## 2012-08-26 DIAGNOSIS — R269 Unspecified abnormalities of gait and mobility: Secondary | ICD-10-CM | POA: Diagnosis not present

## 2012-08-26 DIAGNOSIS — E119 Type 2 diabetes mellitus without complications: Secondary | ICD-10-CM | POA: Diagnosis not present

## 2012-08-26 DIAGNOSIS — I739 Peripheral vascular disease, unspecified: Secondary | ICD-10-CM | POA: Diagnosis not present

## 2012-08-26 DIAGNOSIS — Z4789 Encounter for other orthopedic aftercare: Secondary | ICD-10-CM | POA: Diagnosis not present

## 2012-08-26 DIAGNOSIS — I120 Hypertensive chronic kidney disease with stage 5 chronic kidney disease or end stage renal disease: Secondary | ICD-10-CM | POA: Diagnosis not present

## 2012-08-27 ENCOUNTER — Ambulatory Visit (INDEPENDENT_AMBULATORY_CARE_PROVIDER_SITE_OTHER): Payer: Medicare Other | Admitting: Vascular Surgery

## 2012-08-27 ENCOUNTER — Encounter: Payer: Self-pay | Admitting: Vascular Surgery

## 2012-08-27 VITALS — BP 131/72 | HR 100 | Ht 62.0 in | Wt 152.0 lb

## 2012-08-27 DIAGNOSIS — I70269 Atherosclerosis of native arteries of extremities with gangrene, unspecified extremity: Secondary | ICD-10-CM

## 2012-08-27 NOTE — Progress Notes (Signed)
Vascular and Vein Specialist of [facility]  Patient name: [name] MRN: [id number] DOB: 12-Jun-1960 Sex: female  REASON FOR VISIT: follow up after left transmetatarsal amputation  HPI: Rheda Kassab is a 52 y.o. female who underwent a left transmetatarsal amputation on 07/15/2012. She presented with gangrene of her left forefoot. She had undergone endovascular revascularization by Dr. Myra Gianotti. She has two-vessel runoff via the posterior tibial artery and anterior tibial artery. Home health has been doing her dressing changes. They have been soaking her foot daily with lukewarm Dial soap soaks and doing a dressing change with hydrogel. She comes in for a three-week visit. He denies fever or chills.  REVIEW OF SYSTEMS: Arly.Keller ] denotes positive finding; [  ] denotes negative finding  CARDIOVASCULAR:  [ ]  chest pain   [ ]  dyspnea on exertion    CONSTITUTIONAL:  [ ]  fever   [ ]  chills  PHYSICAL EXAM: Filed Vitals:   08/27/12 1614  BP: 131/72  Pulse: 100  Height: 5\' 2"  (1.575 m)  Weight: 152 lb (68.947 kg)  SpO2: 99%   Body mass index is 27.79 kg/(m^2). GENERAL: The patient is a well-nourished female, in no acute distress. The vital signs are documented above. CARDIOVASCULAR: There is a regular rate and rhythm  PULMONARY: There is good air exchange bilaterally without wheezing or rales. I removed her staples from her transmetatarsal amputation site today. There is separation along the wound however there is some granulation tissue.  MEDICAL ISSUES: Certainly she is not out of the woods yet however I think there is a reasonable chance of her transmetatarsal amputation site healing. Home health will continue with her dressing changes. I plan on seeing her back in one month. She knows to call sooner if she has problems.  DICKSON,CHRISTOPHER S [facility] [place] [facility]per: [phone]

## 2012-08-28 DIAGNOSIS — Z4789 Encounter for other orthopedic aftercare: Secondary | ICD-10-CM | POA: Diagnosis not present

## 2012-08-28 DIAGNOSIS — I120 Hypertensive chronic kidney disease with stage 5 chronic kidney disease or end stage renal disease: Secondary | ICD-10-CM | POA: Diagnosis not present

## 2012-08-28 DIAGNOSIS — E119 Type 2 diabetes mellitus without complications: Secondary | ICD-10-CM | POA: Diagnosis not present

## 2012-08-28 DIAGNOSIS — R269 Unspecified abnormalities of gait and mobility: Secondary | ICD-10-CM | POA: Diagnosis not present

## 2012-08-28 DIAGNOSIS — N186 End stage renal disease: Secondary | ICD-10-CM | POA: Diagnosis not present

## 2012-08-28 DIAGNOSIS — I739 Peripheral vascular disease, unspecified: Secondary | ICD-10-CM | POA: Diagnosis not present

## 2012-08-30 DIAGNOSIS — R269 Unspecified abnormalities of gait and mobility: Secondary | ICD-10-CM | POA: Diagnosis not present

## 2012-08-30 DIAGNOSIS — E119 Type 2 diabetes mellitus without complications: Secondary | ICD-10-CM | POA: Diagnosis not present

## 2012-08-30 DIAGNOSIS — N186 End stage renal disease: Secondary | ICD-10-CM | POA: Diagnosis not present

## 2012-08-30 DIAGNOSIS — Z4789 Encounter for other orthopedic aftercare: Secondary | ICD-10-CM | POA: Diagnosis not present

## 2012-08-30 DIAGNOSIS — I120 Hypertensive chronic kidney disease with stage 5 chronic kidney disease or end stage renal disease: Secondary | ICD-10-CM | POA: Diagnosis not present

## 2012-08-30 DIAGNOSIS — I739 Peripheral vascular disease, unspecified: Secondary | ICD-10-CM | POA: Diagnosis not present

## 2012-09-02 DIAGNOSIS — R269 Unspecified abnormalities of gait and mobility: Secondary | ICD-10-CM | POA: Diagnosis not present

## 2012-09-02 DIAGNOSIS — I739 Peripheral vascular disease, unspecified: Secondary | ICD-10-CM | POA: Diagnosis not present

## 2012-09-02 DIAGNOSIS — E119 Type 2 diabetes mellitus without complications: Secondary | ICD-10-CM | POA: Diagnosis not present

## 2012-09-02 DIAGNOSIS — N186 End stage renal disease: Secondary | ICD-10-CM | POA: Diagnosis not present

## 2012-09-02 DIAGNOSIS — I120 Hypertensive chronic kidney disease with stage 5 chronic kidney disease or end stage renal disease: Secondary | ICD-10-CM | POA: Diagnosis not present

## 2012-09-02 DIAGNOSIS — Z4789 Encounter for other orthopedic aftercare: Secondary | ICD-10-CM | POA: Diagnosis not present

## 2012-09-03 ENCOUNTER — Encounter (HOSPITAL_BASED_OUTPATIENT_CLINIC_OR_DEPARTMENT_OTHER): Payer: Medicare Other | Attending: General Surgery

## 2012-09-03 DIAGNOSIS — I739 Peripheral vascular disease, unspecified: Secondary | ICD-10-CM | POA: Diagnosis not present

## 2012-09-03 DIAGNOSIS — K219 Gastro-esophageal reflux disease without esophagitis: Secondary | ICD-10-CM | POA: Insufficient documentation

## 2012-09-03 DIAGNOSIS — D649 Anemia, unspecified: Secondary | ICD-10-CM | POA: Insufficient documentation

## 2012-09-03 DIAGNOSIS — N186 End stage renal disease: Secondary | ICD-10-CM | POA: Insufficient documentation

## 2012-09-03 DIAGNOSIS — Z992 Dependence on renal dialysis: Secondary | ICD-10-CM | POA: Insufficient documentation

## 2012-09-03 DIAGNOSIS — E1169 Type 2 diabetes mellitus with other specified complication: Secondary | ICD-10-CM | POA: Insufficient documentation

## 2012-09-03 DIAGNOSIS — S98919A Complete traumatic amputation of unspecified foot, level unspecified, initial encounter: Secondary | ICD-10-CM | POA: Insufficient documentation

## 2012-09-03 DIAGNOSIS — Z79899 Other long term (current) drug therapy: Secondary | ICD-10-CM | POA: Insufficient documentation

## 2012-09-03 DIAGNOSIS — I120 Hypertensive chronic kidney disease with stage 5 chronic kidney disease or end stage renal disease: Secondary | ICD-10-CM | POA: Insufficient documentation

## 2012-09-03 DIAGNOSIS — E119 Type 2 diabetes mellitus without complications: Secondary | ICD-10-CM | POA: Diagnosis not present

## 2012-09-03 DIAGNOSIS — E1129 Type 2 diabetes mellitus with other diabetic kidney complication: Secondary | ICD-10-CM | POA: Insufficient documentation

## 2012-09-03 DIAGNOSIS — L97509 Non-pressure chronic ulcer of other part of unspecified foot with unspecified severity: Secondary | ICD-10-CM | POA: Insufficient documentation

## 2012-09-03 DIAGNOSIS — L84 Corns and callosities: Secondary | ICD-10-CM | POA: Insufficient documentation

## 2012-09-03 DIAGNOSIS — N2581 Secondary hyperparathyroidism of renal origin: Secondary | ICD-10-CM | POA: Insufficient documentation

## 2012-09-03 DIAGNOSIS — R269 Unspecified abnormalities of gait and mobility: Secondary | ICD-10-CM | POA: Diagnosis not present

## 2012-09-03 DIAGNOSIS — Z4789 Encounter for other orthopedic aftercare: Secondary | ICD-10-CM | POA: Diagnosis not present

## 2012-09-03 DIAGNOSIS — Z8673 Personal history of transient ischemic attack (TIA), and cerebral infarction without residual deficits: Secondary | ICD-10-CM | POA: Insufficient documentation

## 2012-09-04 DIAGNOSIS — I120 Hypertensive chronic kidney disease with stage 5 chronic kidney disease or end stage renal disease: Secondary | ICD-10-CM | POA: Diagnosis not present

## 2012-09-04 DIAGNOSIS — I739 Peripheral vascular disease, unspecified: Secondary | ICD-10-CM | POA: Diagnosis not present

## 2012-09-04 DIAGNOSIS — R269 Unspecified abnormalities of gait and mobility: Secondary | ICD-10-CM | POA: Diagnosis not present

## 2012-09-04 DIAGNOSIS — Z4789 Encounter for other orthopedic aftercare: Secondary | ICD-10-CM | POA: Diagnosis not present

## 2012-09-04 DIAGNOSIS — E119 Type 2 diabetes mellitus without complications: Secondary | ICD-10-CM | POA: Diagnosis not present

## 2012-09-04 DIAGNOSIS — N186 End stage renal disease: Secondary | ICD-10-CM | POA: Diagnosis not present

## 2012-09-09 DIAGNOSIS — I739 Peripheral vascular disease, unspecified: Secondary | ICD-10-CM | POA: Diagnosis not present

## 2012-09-09 DIAGNOSIS — R269 Unspecified abnormalities of gait and mobility: Secondary | ICD-10-CM | POA: Diagnosis not present

## 2012-09-09 DIAGNOSIS — E119 Type 2 diabetes mellitus without complications: Secondary | ICD-10-CM | POA: Diagnosis not present

## 2012-09-09 DIAGNOSIS — Z4789 Encounter for other orthopedic aftercare: Secondary | ICD-10-CM | POA: Diagnosis not present

## 2012-09-09 DIAGNOSIS — I120 Hypertensive chronic kidney disease with stage 5 chronic kidney disease or end stage renal disease: Secondary | ICD-10-CM | POA: Diagnosis not present

## 2012-09-09 DIAGNOSIS — N186 End stage renal disease: Secondary | ICD-10-CM | POA: Diagnosis not present

## 2012-09-11 DIAGNOSIS — I120 Hypertensive chronic kidney disease with stage 5 chronic kidney disease or end stage renal disease: Secondary | ICD-10-CM | POA: Diagnosis not present

## 2012-09-11 DIAGNOSIS — R269 Unspecified abnormalities of gait and mobility: Secondary | ICD-10-CM | POA: Diagnosis not present

## 2012-09-11 DIAGNOSIS — Z4789 Encounter for other orthopedic aftercare: Secondary | ICD-10-CM | POA: Diagnosis not present

## 2012-09-11 DIAGNOSIS — I739 Peripheral vascular disease, unspecified: Secondary | ICD-10-CM | POA: Diagnosis not present

## 2012-09-11 DIAGNOSIS — N186 End stage renal disease: Secondary | ICD-10-CM | POA: Diagnosis not present

## 2012-09-11 DIAGNOSIS — E119 Type 2 diabetes mellitus without complications: Secondary | ICD-10-CM | POA: Diagnosis not present

## 2012-09-17 DIAGNOSIS — N186 End stage renal disease: Secondary | ICD-10-CM | POA: Diagnosis not present

## 2012-09-17 DIAGNOSIS — Z4789 Encounter for other orthopedic aftercare: Secondary | ICD-10-CM | POA: Diagnosis not present

## 2012-09-17 DIAGNOSIS — R269 Unspecified abnormalities of gait and mobility: Secondary | ICD-10-CM | POA: Diagnosis not present

## 2012-09-17 DIAGNOSIS — E119 Type 2 diabetes mellitus without complications: Secondary | ICD-10-CM | POA: Diagnosis not present

## 2012-09-17 DIAGNOSIS — I120 Hypertensive chronic kidney disease with stage 5 chronic kidney disease or end stage renal disease: Secondary | ICD-10-CM | POA: Diagnosis not present

## 2012-09-17 DIAGNOSIS — I739 Peripheral vascular disease, unspecified: Secondary | ICD-10-CM | POA: Diagnosis not present

## 2012-09-18 DIAGNOSIS — Z4789 Encounter for other orthopedic aftercare: Secondary | ICD-10-CM | POA: Diagnosis not present

## 2012-09-18 DIAGNOSIS — N2581 Secondary hyperparathyroidism of renal origin: Secondary | ICD-10-CM | POA: Diagnosis not present

## 2012-09-18 DIAGNOSIS — L84 Corns and callosities: Secondary | ICD-10-CM | POA: Diagnosis not present

## 2012-09-18 DIAGNOSIS — Z79899 Other long term (current) drug therapy: Secondary | ICD-10-CM | POA: Diagnosis not present

## 2012-09-18 DIAGNOSIS — D649 Anemia, unspecified: Secondary | ICD-10-CM | POA: Diagnosis not present

## 2012-09-18 DIAGNOSIS — Z8673 Personal history of transient ischemic attack (TIA), and cerebral infarction without residual deficits: Secondary | ICD-10-CM | POA: Diagnosis not present

## 2012-09-18 DIAGNOSIS — I120 Hypertensive chronic kidney disease with stage 5 chronic kidney disease or end stage renal disease: Secondary | ICD-10-CM | POA: Diagnosis not present

## 2012-09-18 DIAGNOSIS — K219 Gastro-esophageal reflux disease without esophagitis: Secondary | ICD-10-CM | POA: Diagnosis not present

## 2012-09-18 DIAGNOSIS — E119 Type 2 diabetes mellitus without complications: Secondary | ICD-10-CM | POA: Diagnosis not present

## 2012-09-18 DIAGNOSIS — R269 Unspecified abnormalities of gait and mobility: Secondary | ICD-10-CM | POA: Diagnosis not present

## 2012-09-18 DIAGNOSIS — E1129 Type 2 diabetes mellitus with other diabetic kidney complication: Secondary | ICD-10-CM | POA: Diagnosis not present

## 2012-09-18 DIAGNOSIS — I739 Peripheral vascular disease, unspecified: Secondary | ICD-10-CM | POA: Diagnosis not present

## 2012-09-18 DIAGNOSIS — Z992 Dependence on renal dialysis: Secondary | ICD-10-CM | POA: Diagnosis not present

## 2012-09-18 DIAGNOSIS — L97509 Non-pressure chronic ulcer of other part of unspecified foot with unspecified severity: Secondary | ICD-10-CM | POA: Diagnosis not present

## 2012-09-18 DIAGNOSIS — E1169 Type 2 diabetes mellitus with other specified complication: Secondary | ICD-10-CM | POA: Diagnosis not present

## 2012-09-18 DIAGNOSIS — N186 End stage renal disease: Secondary | ICD-10-CM | POA: Diagnosis not present

## 2012-09-18 DIAGNOSIS — S98919A Complete traumatic amputation of unspecified foot, level unspecified, initial encounter: Secondary | ICD-10-CM | POA: Diagnosis not present

## 2012-09-18 LAB — GLUCOSE, CAPILLARY: Glucose-Capillary: 180 mg/dL — ABNORMAL HIGH (ref 70–99)

## 2012-09-19 NOTE — Progress Notes (Signed)
[facility]  NAME:  [name], [name]              ACCOUNT NO.:  [id number]  MEDICAL RECORD NO.:  [id number]      DATE OF BIRTH:  Aug 27, 1960  PHYSICIAN:  Maxwell Caul, M.D. VISIT DATE:  09/18/2012                                  OFFICE VISIT   [name] is a 52 year old woman who had presented earlier this year with gangrene of the left foot with dry gangrene involving the left great toe, and some blistering on the dorsum of her foot in addition to the gangrene of the fourth toe on that side.  She was found to have infrainguinal arterial disease.  She underwent an arteriogram by Dr. Myra Gianotti on February 2nd.  She was found to have a critical left popliteal artery stenosis, which was successfully revascularized with PTCA.  She ultimately underwent transmetatarsal amputation by Dr. Edilia Bo on March 25th.  She is type 2 diabetic with chronic renal failure on dialysis.  She takes oral agents for her diabetes.  After her amputation, she was in one of the [facility] skilled facilities.  I believe they were the ones who sent her here.  She also follows with Dr. Edilia Bo, last seen on Aug 27, 2012.  I believe she has been soaking the surgical site in Dial soap soaks with a wet-to-dry dressing with gauze and Hydrogel.  This is being done by home care.  PAST MEDICAL HISTORY/PROBLEM LIST: 1. End-stage renal disease, on dialysis secondary to diabetes. 2. Hypertension. 3. CVA in 2002, right parietal. 4. Secondary hyperparathyroidism status post parathyroidectomy. 5. Known peripheral vascular disease as discussed above. 6. Gastroesophageal reflux disease. 7. Anemia.  MEDICATION LIST:  Reviewed. 1. She is on vancomycin and Fortaz at dialysis, not completely certain     if this is due to her left foot or not.  I only noticed this after     she had left the clinic.  We will need to clarify this. 2. Nexium 40 daily. 3. Glucotrol 10 b.i.d. 4. Norvasc 10 daily. 5. ASA  81 daily.  PHYSICAL EXAMINATION:  VITAL SIGNS:  Temperature is 97.8, pulse 88, respirations 16, blood pressure is 155/82.  CBG is 180. RESPIRATORY:  Clear air entry bilaterally. CARDIAC:  Heart sounds are normal.  There are no signs of dehydration or congestive heart failure. EXTREMITIES:  Peripheral pulses were difficult to feel at the dorsalis pedis or posterior tibial.  The area on the left foot, which was her transmetatarsal amputation site measures 1 x 8.4 x 0.6.  There is a fibrous adherent eschar to this wound site, which will need to be debrided to have any chance of healing this.  Also, there is a very thick callus underneath this.  The exact status of this is not totally clear; however, there are no open areas.  The patient is noted to be walking on this in a healing sandal.  There is also an area to the left lateral heel measuring 1.4 x 0.8 x 0.1.  This is also covered with a thick adherent eschar.  IMPRESSION: 1. Wagner's grade 3 diabetic foot ulcers as described above.  The     transmetatarsal amputation site is going to need some work.  This     would include both chemical and mechanical debridement.  At  some     point, I think callus on the bottom of this may need to be removed     to make sure there is no extension of the wound underneath this.     How much of this can be done in this clinic is unclear.  She     certainly did not tolerate any attempt at this today.  I may ask     Dr. Kelly Splinter to consider doing this in the OR as an outpatient.     We debrided these wounds with Santyl or Medihoney with a foam cover     and a Kerlix and net wrap.  The heel was also dressed in a similar     fashion.  We will see her again in a week's time. 2. Known peripheral artery disease status post percutaneous     transluminal coronary angioplasty of critical stenosis of the     popliteal.  Presumably, the vascular supply here now is adequate to     attempt to heal this. 3. On  vancomycin and Fortaz at dialysis.  Once again, I was not aware     of this while she was in the clinic.  I will try to clarify the     reason for this before my next visit in a week's time.  Researching     [facility], I did not see any relevant cultures or x-rays.          ______________________________ Maxwell Caul, M.D.     MGR/MEDQ  D:  09/18/2012  T:  09/19/2012  Job:  [id number]

## 2012-09-20 DIAGNOSIS — N186 End stage renal disease: Secondary | ICD-10-CM | POA: Diagnosis not present

## 2012-09-22 DIAGNOSIS — Z23 Encounter for immunization: Secondary | ICD-10-CM | POA: Diagnosis not present

## 2012-09-22 DIAGNOSIS — D509 Iron deficiency anemia, unspecified: Secondary | ICD-10-CM | POA: Diagnosis not present

## 2012-09-22 DIAGNOSIS — N186 End stage renal disease: Secondary | ICD-10-CM | POA: Diagnosis not present

## 2012-09-23 DIAGNOSIS — N186 End stage renal disease: Secondary | ICD-10-CM | POA: Diagnosis not present

## 2012-09-23 DIAGNOSIS — R269 Unspecified abnormalities of gait and mobility: Secondary | ICD-10-CM | POA: Diagnosis not present

## 2012-09-23 DIAGNOSIS — I120 Hypertensive chronic kidney disease with stage 5 chronic kidney disease or end stage renal disease: Secondary | ICD-10-CM | POA: Diagnosis not present

## 2012-09-23 DIAGNOSIS — I739 Peripheral vascular disease, unspecified: Secondary | ICD-10-CM | POA: Diagnosis not present

## 2012-09-23 DIAGNOSIS — Z4789 Encounter for other orthopedic aftercare: Secondary | ICD-10-CM | POA: Diagnosis not present

## 2012-09-23 DIAGNOSIS — E119 Type 2 diabetes mellitus without complications: Secondary | ICD-10-CM | POA: Diagnosis not present

## 2012-09-24 DIAGNOSIS — N186 End stage renal disease: Secondary | ICD-10-CM | POA: Diagnosis not present

## 2012-09-24 DIAGNOSIS — D509 Iron deficiency anemia, unspecified: Secondary | ICD-10-CM | POA: Diagnosis not present

## 2012-09-24 DIAGNOSIS — Z23 Encounter for immunization: Secondary | ICD-10-CM | POA: Diagnosis not present

## 2012-09-25 ENCOUNTER — Encounter (HOSPITAL_BASED_OUTPATIENT_CLINIC_OR_DEPARTMENT_OTHER): Payer: Medicare Other | Attending: Internal Medicine

## 2012-09-25 DIAGNOSIS — T8189XA Other complications of procedures, not elsewhere classified, initial encounter: Secondary | ICD-10-CM | POA: Insufficient documentation

## 2012-09-25 DIAGNOSIS — Y835 Amputation of limb(s) as the cause of abnormal reaction of the patient, or of later complication, without mention of misadventure at the time of the procedure: Secondary | ICD-10-CM | POA: Insufficient documentation

## 2012-09-25 DIAGNOSIS — L84 Corns and callosities: Secondary | ICD-10-CM | POA: Diagnosis not present

## 2012-09-25 DIAGNOSIS — Z8673 Personal history of transient ischemic attack (TIA), and cerebral infarction without residual deficits: Secondary | ICD-10-CM | POA: Diagnosis not present

## 2012-09-25 DIAGNOSIS — I739 Peripheral vascular disease, unspecified: Secondary | ICD-10-CM | POA: Diagnosis not present

## 2012-09-25 DIAGNOSIS — E1169 Type 2 diabetes mellitus with other specified complication: Secondary | ICD-10-CM | POA: Diagnosis not present

## 2012-09-25 DIAGNOSIS — N186 End stage renal disease: Secondary | ICD-10-CM | POA: Diagnosis not present

## 2012-09-25 DIAGNOSIS — S98919A Complete traumatic amputation of unspecified foot, level unspecified, initial encounter: Secondary | ICD-10-CM | POA: Insufficient documentation

## 2012-09-25 DIAGNOSIS — L97509 Non-pressure chronic ulcer of other part of unspecified foot with unspecified severity: Secondary | ICD-10-CM | POA: Insufficient documentation

## 2012-09-25 DIAGNOSIS — I120 Hypertensive chronic kidney disease with stage 5 chronic kidney disease or end stage renal disease: Secondary | ICD-10-CM | POA: Diagnosis not present

## 2012-09-26 DIAGNOSIS — N186 End stage renal disease: Secondary | ICD-10-CM | POA: Diagnosis not present

## 2012-09-26 DIAGNOSIS — D509 Iron deficiency anemia, unspecified: Secondary | ICD-10-CM | POA: Diagnosis not present

## 2012-09-26 DIAGNOSIS — Z23 Encounter for immunization: Secondary | ICD-10-CM | POA: Diagnosis not present

## 2012-09-27 DIAGNOSIS — R269 Unspecified abnormalities of gait and mobility: Secondary | ICD-10-CM | POA: Diagnosis not present

## 2012-09-27 DIAGNOSIS — Z4789 Encounter for other orthopedic aftercare: Secondary | ICD-10-CM | POA: Diagnosis not present

## 2012-09-27 DIAGNOSIS — I120 Hypertensive chronic kidney disease with stage 5 chronic kidney disease or end stage renal disease: Secondary | ICD-10-CM | POA: Diagnosis not present

## 2012-09-27 DIAGNOSIS — E119 Type 2 diabetes mellitus without complications: Secondary | ICD-10-CM | POA: Diagnosis not present

## 2012-09-27 DIAGNOSIS — I739 Peripheral vascular disease, unspecified: Secondary | ICD-10-CM | POA: Diagnosis not present

## 2012-09-27 DIAGNOSIS — N186 End stage renal disease: Secondary | ICD-10-CM | POA: Diagnosis not present

## 2012-09-29 DIAGNOSIS — N186 End stage renal disease: Secondary | ICD-10-CM | POA: Diagnosis not present

## 2012-09-29 DIAGNOSIS — Z23 Encounter for immunization: Secondary | ICD-10-CM | POA: Diagnosis not present

## 2012-09-29 DIAGNOSIS — D509 Iron deficiency anemia, unspecified: Secondary | ICD-10-CM | POA: Diagnosis not present

## 2012-09-30 DIAGNOSIS — I739 Peripheral vascular disease, unspecified: Secondary | ICD-10-CM | POA: Diagnosis not present

## 2012-09-30 DIAGNOSIS — I120 Hypertensive chronic kidney disease with stage 5 chronic kidney disease or end stage renal disease: Secondary | ICD-10-CM | POA: Diagnosis not present

## 2012-09-30 DIAGNOSIS — N186 End stage renal disease: Secondary | ICD-10-CM | POA: Diagnosis not present

## 2012-09-30 DIAGNOSIS — R269 Unspecified abnormalities of gait and mobility: Secondary | ICD-10-CM | POA: Diagnosis not present

## 2012-09-30 DIAGNOSIS — Z4789 Encounter for other orthopedic aftercare: Secondary | ICD-10-CM | POA: Diagnosis not present

## 2012-09-30 DIAGNOSIS — E119 Type 2 diabetes mellitus without complications: Secondary | ICD-10-CM | POA: Diagnosis not present

## 2012-10-01 DIAGNOSIS — N186 End stage renal disease: Secondary | ICD-10-CM | POA: Diagnosis not present

## 2012-10-01 DIAGNOSIS — Z23 Encounter for immunization: Secondary | ICD-10-CM | POA: Diagnosis not present

## 2012-10-01 DIAGNOSIS — D509 Iron deficiency anemia, unspecified: Secondary | ICD-10-CM | POA: Diagnosis not present

## 2012-10-02 ENCOUNTER — Ambulatory Visit (HOSPITAL_COMMUNITY)
Admission: RE | Admit: 2012-10-02 | Discharge: 2012-10-02 | Disposition: A | Discharge status: Home / Self Care | Payer: Medicare Other | Source: Ambulatory Visit | Attending: Internal Medicine | Admitting: Internal Medicine

## 2012-10-02 ENCOUNTER — Other Ambulatory Visit (HOSPITAL_BASED_OUTPATIENT_CLINIC_OR_DEPARTMENT_OTHER): Payer: Self-pay | Admitting: Internal Medicine

## 2012-10-02 DIAGNOSIS — S91309A Unspecified open wound, unspecified foot, initial encounter: Secondary | ICD-10-CM | POA: Diagnosis not present

## 2012-10-02 DIAGNOSIS — R9389 Abnormal findings on diagnostic imaging of other specified body structures: Secondary | ICD-10-CM | POA: Diagnosis not present

## 2012-10-02 DIAGNOSIS — E1169 Type 2 diabetes mellitus with other specified complication: Secondary | ICD-10-CM | POA: Diagnosis not present

## 2012-10-02 DIAGNOSIS — L84 Corns and callosities: Secondary | ICD-10-CM | POA: Diagnosis not present

## 2012-10-02 DIAGNOSIS — T148XXA Other injury of unspecified body region, initial encounter: Secondary | ICD-10-CM

## 2012-10-02 DIAGNOSIS — T8189XA Other complications of procedures, not elsewhere classified, initial encounter: Secondary | ICD-10-CM | POA: Diagnosis not present

## 2012-10-02 DIAGNOSIS — L97509 Non-pressure chronic ulcer of other part of unspecified foot with unspecified severity: Secondary | ICD-10-CM | POA: Diagnosis not present

## 2012-10-02 DIAGNOSIS — X58XXXA Exposure to other specified factors, initial encounter: Secondary | ICD-10-CM | POA: Insufficient documentation

## 2012-10-02 LAB — GLUCOSE, CAPILLARY: Glucose-Capillary: 275 mg/dL — ABNORMAL HIGH (ref 70–99)

## 2012-10-03 DIAGNOSIS — D509 Iron deficiency anemia, unspecified: Secondary | ICD-10-CM | POA: Diagnosis not present

## 2012-10-03 DIAGNOSIS — Z23 Encounter for immunization: Secondary | ICD-10-CM | POA: Diagnosis not present

## 2012-10-03 DIAGNOSIS — N186 End stage renal disease: Secondary | ICD-10-CM | POA: Diagnosis not present

## 2012-10-04 DIAGNOSIS — N186 End stage renal disease: Secondary | ICD-10-CM | POA: Diagnosis not present

## 2012-10-04 DIAGNOSIS — I120 Hypertensive chronic kidney disease with stage 5 chronic kidney disease or end stage renal disease: Secondary | ICD-10-CM | POA: Diagnosis not present

## 2012-10-04 DIAGNOSIS — Z4789 Encounter for other orthopedic aftercare: Secondary | ICD-10-CM | POA: Diagnosis not present

## 2012-10-04 DIAGNOSIS — R269 Unspecified abnormalities of gait and mobility: Secondary | ICD-10-CM | POA: Diagnosis not present

## 2012-10-04 DIAGNOSIS — I739 Peripheral vascular disease, unspecified: Secondary | ICD-10-CM | POA: Diagnosis not present

## 2012-10-04 DIAGNOSIS — E119 Type 2 diabetes mellitus without complications: Secondary | ICD-10-CM | POA: Diagnosis not present

## 2012-10-06 DIAGNOSIS — D509 Iron deficiency anemia, unspecified: Secondary | ICD-10-CM | POA: Diagnosis not present

## 2012-10-06 DIAGNOSIS — N186 End stage renal disease: Secondary | ICD-10-CM | POA: Diagnosis not present

## 2012-10-06 DIAGNOSIS — Z23 Encounter for immunization: Secondary | ICD-10-CM | POA: Diagnosis not present

## 2012-10-07 ENCOUNTER — Encounter: Payer: Self-pay | Admitting: Vascular Surgery

## 2012-10-07 DIAGNOSIS — R269 Unspecified abnormalities of gait and mobility: Secondary | ICD-10-CM | POA: Diagnosis not present

## 2012-10-07 DIAGNOSIS — I120 Hypertensive chronic kidney disease with stage 5 chronic kidney disease or end stage renal disease: Secondary | ICD-10-CM | POA: Diagnosis not present

## 2012-10-07 DIAGNOSIS — N186 End stage renal disease: Secondary | ICD-10-CM | POA: Diagnosis not present

## 2012-10-07 DIAGNOSIS — Z4789 Encounter for other orthopedic aftercare: Secondary | ICD-10-CM | POA: Diagnosis not present

## 2012-10-07 DIAGNOSIS — I739 Peripheral vascular disease, unspecified: Secondary | ICD-10-CM | POA: Diagnosis not present

## 2012-10-07 DIAGNOSIS — E119 Type 2 diabetes mellitus without complications: Secondary | ICD-10-CM | POA: Diagnosis not present

## 2012-10-08 ENCOUNTER — Encounter: Payer: Self-pay | Admitting: Vascular Surgery

## 2012-10-08 ENCOUNTER — Ambulatory Visit (INDEPENDENT_AMBULATORY_CARE_PROVIDER_SITE_OTHER): Payer: Medicare Other | Admitting: Vascular Surgery

## 2012-10-08 VITALS — BP 123/69 | HR 105 | Ht 62.0 in | Wt 153.0 lb

## 2012-10-08 DIAGNOSIS — N186 End stage renal disease: Secondary | ICD-10-CM | POA: Diagnosis not present

## 2012-10-08 DIAGNOSIS — I70269 Atherosclerosis of native arteries of extremities with gangrene, unspecified extremity: Secondary | ICD-10-CM

## 2012-10-08 DIAGNOSIS — Z23 Encounter for immunization: Secondary | ICD-10-CM | POA: Diagnosis not present

## 2012-10-08 DIAGNOSIS — D509 Iron deficiency anemia, unspecified: Secondary | ICD-10-CM | POA: Diagnosis not present

## 2012-10-08 NOTE — Progress Notes (Signed)
Vascular and Vein Specialist of [facility]  Patient name: [name] MRN: [id number] DOB: 07/16/1960 Sex: female  REASON FOR VISIT: follow up of left transmetatarsal amputation.  HPI: [name] is a 52 y.o. female who presented with gangrene of her left forefoot. She underwent endovascular revascularization by Dr. Myra Gianotti and has two-vessel runoff via the posterior tibial and anterior tibial arteries on the left. I performed a left transmetatarsal amputation on 07/15/2012. I last saw her on 08/27/2012. At that time there was some separation of the wound but there appeared to be some evidence of granulation tissue. She comes in for a one-month follow up visit. She has been going to the wound care center. She is complaining about debridements which are hurting her. She denies fever or chills.  REVIEW OF SYSTEMS: Arly.Keller ] denotes positive finding; [  ] denotes negative finding  CARDIOVASCULAR:  [ ]  chest pain   [ ]  dyspnea on exertion    CONSTITUTIONAL:  [ ]  fever   [ ]  chills  PHYSICAL EXAM: Filed Vitals:   10/08/12 1620  BP: 123/69  Pulse: 105  Height: 5\' 2"  (1.575 m)  Weight: 153 lb (69.4 kg)  SpO2: 100%   Body mass index is 27.98 kg/(m^2). GENERAL: The patient is a well-nourished female, in no acute distress. The vital signs are documented above. CARDIOVASCULAR: There is a regular rate and rhythm  PULMONARY: There is good air exchange bilaterally without wheezing or rales. She has a brisk anterior tibial and posterior tibial signal with the Doppler on the left foot. Her is some fibrin tissue which he did not want me to debride today. However, beneath that there appears to be reasonable granulation tissue. I think that this has a reasonable chance of healing with continued aggressive wound care which is been done by the wound care center.  MEDICAL ISSUES: She'll continue her wound care with the wound care center and I'll plan on seeing her back in 6 weeks. She knows to call sooner if  she has problems.  DICKSON,CHRISTOPHER S [facility] [place] Beeper: [phone]

## 2012-10-09 DIAGNOSIS — L84 Corns and callosities: Secondary | ICD-10-CM | POA: Diagnosis not present

## 2012-10-09 DIAGNOSIS — T8189XA Other complications of procedures, not elsewhere classified, initial encounter: Secondary | ICD-10-CM | POA: Diagnosis not present

## 2012-10-09 DIAGNOSIS — L97509 Non-pressure chronic ulcer of other part of unspecified foot with unspecified severity: Secondary | ICD-10-CM | POA: Diagnosis not present

## 2012-10-09 DIAGNOSIS — T8133XA Disruption of traumatic injury wound repair, initial encounter: Secondary | ICD-10-CM | POA: Diagnosis not present

## 2012-10-09 DIAGNOSIS — E1169 Type 2 diabetes mellitus with other specified complication: Secondary | ICD-10-CM | POA: Diagnosis not present

## 2012-10-10 DIAGNOSIS — Z23 Encounter for immunization: Secondary | ICD-10-CM | POA: Diagnosis not present

## 2012-10-10 DIAGNOSIS — D509 Iron deficiency anemia, unspecified: Secondary | ICD-10-CM | POA: Diagnosis not present

## 2012-10-10 DIAGNOSIS — N186 End stage renal disease: Secondary | ICD-10-CM | POA: Diagnosis not present

## 2012-10-11 DIAGNOSIS — I120 Hypertensive chronic kidney disease with stage 5 chronic kidney disease or end stage renal disease: Secondary | ICD-10-CM | POA: Diagnosis not present

## 2012-10-11 DIAGNOSIS — R269 Unspecified abnormalities of gait and mobility: Secondary | ICD-10-CM | POA: Diagnosis not present

## 2012-10-11 DIAGNOSIS — I739 Peripheral vascular disease, unspecified: Secondary | ICD-10-CM | POA: Diagnosis not present

## 2012-10-11 DIAGNOSIS — Z4789 Encounter for other orthopedic aftercare: Secondary | ICD-10-CM | POA: Diagnosis not present

## 2012-10-11 DIAGNOSIS — E119 Type 2 diabetes mellitus without complications: Secondary | ICD-10-CM | POA: Diagnosis not present

## 2012-10-11 DIAGNOSIS — N186 End stage renal disease: Secondary | ICD-10-CM | POA: Diagnosis not present

## 2012-10-13 DIAGNOSIS — Z23 Encounter for immunization: Secondary | ICD-10-CM | POA: Diagnosis not present

## 2012-10-13 DIAGNOSIS — N186 End stage renal disease: Secondary | ICD-10-CM | POA: Diagnosis not present

## 2012-10-13 DIAGNOSIS — D509 Iron deficiency anemia, unspecified: Secondary | ICD-10-CM | POA: Diagnosis not present

## 2012-10-15 DIAGNOSIS — N186 End stage renal disease: Secondary | ICD-10-CM | POA: Diagnosis not present

## 2012-10-15 DIAGNOSIS — D509 Iron deficiency anemia, unspecified: Secondary | ICD-10-CM | POA: Diagnosis not present

## 2012-10-15 DIAGNOSIS — Z23 Encounter for immunization: Secondary | ICD-10-CM | POA: Diagnosis not present

## 2012-10-16 DIAGNOSIS — L84 Corns and callosities: Secondary | ICD-10-CM | POA: Diagnosis not present

## 2012-10-16 DIAGNOSIS — L97509 Non-pressure chronic ulcer of other part of unspecified foot with unspecified severity: Secondary | ICD-10-CM | POA: Diagnosis not present

## 2012-10-16 DIAGNOSIS — T8189XA Other complications of procedures, not elsewhere classified, initial encounter: Secondary | ICD-10-CM | POA: Diagnosis not present

## 2012-10-16 DIAGNOSIS — E1169 Type 2 diabetes mellitus with other specified complication: Secondary | ICD-10-CM | POA: Diagnosis not present

## 2012-10-17 DIAGNOSIS — D509 Iron deficiency anemia, unspecified: Secondary | ICD-10-CM | POA: Diagnosis not present

## 2012-10-17 DIAGNOSIS — Z23 Encounter for immunization: Secondary | ICD-10-CM | POA: Diagnosis not present

## 2012-10-17 DIAGNOSIS — N186 End stage renal disease: Secondary | ICD-10-CM | POA: Diagnosis not present

## 2012-10-19 ENCOUNTER — Emergency Department (HOSPITAL_COMMUNITY)
Admission: EM | Admit: 2012-10-19 | Discharge: 2012-10-19 | Disposition: A | Discharge status: Home / Self Care | Payer: Medicare Other | Attending: Emergency Medicine | Admitting: Emergency Medicine

## 2012-10-19 ENCOUNTER — Encounter (HOSPITAL_COMMUNITY): Payer: Self-pay | Admitting: Emergency Medicine

## 2012-10-19 DIAGNOSIS — K219 Gastro-esophageal reflux disease without esophagitis: Secondary | ICD-10-CM | POA: Insufficient documentation

## 2012-10-19 DIAGNOSIS — S8990XA Unspecified injury of unspecified lower leg, initial encounter: Secondary | ICD-10-CM | POA: Diagnosis not present

## 2012-10-19 DIAGNOSIS — IMO0002 Reserved for concepts with insufficient information to code with codable children: Secondary | ICD-10-CM | POA: Insufficient documentation

## 2012-10-19 DIAGNOSIS — N186 End stage renal disease: Secondary | ICD-10-CM | POA: Insufficient documentation

## 2012-10-19 DIAGNOSIS — I798 Other disorders of arteries, arterioles and capillaries in diseases classified elsewhere: Secondary | ICD-10-CM | POA: Insufficient documentation

## 2012-10-19 DIAGNOSIS — E1159 Type 2 diabetes mellitus with other circulatory complications: Secondary | ICD-10-CM | POA: Insufficient documentation

## 2012-10-19 DIAGNOSIS — Z8701 Personal history of pneumonia (recurrent): Secondary | ICD-10-CM | POA: Diagnosis not present

## 2012-10-19 DIAGNOSIS — Y9389 Activity, other specified: Secondary | ICD-10-CM | POA: Insufficient documentation

## 2012-10-19 DIAGNOSIS — Z8673 Personal history of transient ischemic attack (TIA), and cerebral infarction without residual deficits: Secondary | ICD-10-CM | POA: Diagnosis not present

## 2012-10-19 DIAGNOSIS — Z7982 Long term (current) use of aspirin: Secondary | ICD-10-CM | POA: Diagnosis not present

## 2012-10-19 DIAGNOSIS — Z862 Personal history of diseases of the blood and blood-forming organs and certain disorders involving the immune mechanism: Secondary | ICD-10-CM | POA: Diagnosis not present

## 2012-10-19 DIAGNOSIS — Z79899 Other long term (current) drug therapy: Secondary | ICD-10-CM | POA: Diagnosis not present

## 2012-10-19 DIAGNOSIS — M79609 Pain in unspecified limb: Secondary | ICD-10-CM | POA: Diagnosis not present

## 2012-10-19 DIAGNOSIS — I120 Hypertensive chronic kidney disease with stage 5 chronic kidney disease or end stage renal disease: Secondary | ICD-10-CM | POA: Insufficient documentation

## 2012-10-19 DIAGNOSIS — Z8639 Personal history of other endocrine, nutritional and metabolic disease: Secondary | ICD-10-CM | POA: Insufficient documentation

## 2012-10-19 DIAGNOSIS — S99929A Unspecified injury of unspecified foot, initial encounter: Secondary | ICD-10-CM | POA: Insufficient documentation

## 2012-10-19 DIAGNOSIS — M79671 Pain in right foot: Secondary | ICD-10-CM

## 2012-10-19 DIAGNOSIS — Y9289 Other specified places as the place of occurrence of the external cause: Secondary | ICD-10-CM | POA: Insufficient documentation

## 2012-10-19 MED ORDER — OXYCODONE-ACETAMINOPHEN 5-325 MG PO TABS
2.0000 | ORAL_TABLET | Freq: Once | ORAL | Status: AC
  Administered 2012-10-19: 2 via ORAL
  Filled 2012-10-19: qty 2

## 2012-10-19 MED ORDER — OXYCODONE-ACETAMINOPHEN 5-325 MG PO TABS: 1.0000 | ORAL_TABLET | Freq: Three times a day (TID) | ORAL | Status: DC | PRN

## 2012-10-19 NOTE — ED Notes (Signed)
Pt reports had amputation of toes on left foot in March. Pt reports continue to have pain in left foot even after taking prescribed medications. Pt of [facility] wound center, was told that her culture came back with e-coli in her foot. Pt took Vicodin and diazepam today with no relief.

## 2012-10-19 NOTE — ED Provider Notes (Signed)
History    CSN: [id number] Arrival date & time 10/19/12  1236  First MD Initiated Contact with Patient 10/19/12 1317     Chief Complaint  Patient presents with  . Foot Pain    Left foot   (Consider location/radiation/quality/duration/timing/severity/associated sxs/prior Treatment) HPI  Pt comes to the ER for right foot pain. She had a partial amputation done in March of 2014 and admits that her foot is always hurting. The pain gets aggravated when she gets the dead skin debrided every 09/30/22 at the wound care center. She wants to make sure it doesn't look infected since it is hurting. She also admits to hitting her foot on the ground by accident a few days ago and agrees that may be the cause of the pain as well. She takes diazepam and Vicodin currently for pain. She denies feeling weak, having fevers, n/v/d. No increased warmth, redness, discharge or foul smell coming from the wound. Saw wound Care doctor on Sep 30, 2022 and was told that it is appearing to be healing well.   Past Medical History  Diagnosis Date  . ESRD (end stage renal disease)   . Hypertension   . CVA (cerebral infarction)     right parietal 05/19/2000  . Vaginal bleeding   . Hyperparathyroidism   . Pneumonia   . Stroke     2002,no residual  . Peripheral vascular disease   . GERD (gastroesophageal reflux disease)   . Anemia   . DM (diabetes mellitus) type II controlled peripheral vascular disorder    Past Surgical History  Procedure Laterality Date  . Parathyroidectomy with autotransplant  12/07/2010  . Knee surgery      right, x2  . Brachial artery graft      x2 for dialysis  . Av fistula placement      upper rt thigh  . Dental extractions Bilateral   . Dilation and curettage of uterus      hx: of 1986  . Transmetatarsal amputation Left 07/15/2012    Procedure: TRANSMETATARSAL AMPUTATION;  Surgeon: Chuck Hint, MD;  Location: [facility] OR;  Service: Vascular;  Laterality: Left;   Family History    Problem Relation Age of Onset  . Hypertension Father   . Kidney disease Mother    History  Substance Use Topics  . Smoking status: Never Smoker   . Smokeless tobacco: Never Used  . Alcohol Use: Yes     Comment: occasional   OB History   Grav Para Term Preterm Abortions TAB SAB Ect Mult Living                 Review of Systems  Musculoskeletal: Positive for arthralgias.  All other systems reviewed and are negative.    Allergies  Doxycycline and Peroxyl  Home Medications   Current Outpatient Rx  Name  Route  Sig  Dispense  Refill  . Acetaminophen (TYLENOL PO)   Oral   Take 1-2 tablets by mouth See admin instructions. Uses at dialysis Mon, Wed, Fridays each week for muscle spasms         . amLODipine (NORVASC) 10 MG tablet   Oral   Take 10 mg by mouth at bedtime.          Marland Kitchen aspirin 81 MG tablet   Oral   Take 81 mg by mouth daily.           . calcium carbonate (TUMS - DOSED IN MG ELEMENTAL CALCIUM) 500 MG chewable tablet   Oral  Chew 2 tablets by mouth 3 (three) times daily with meals.          . diazepam (VALIUM) 2 MG tablet   Oral   Take 2 mg by mouth every 6 (six) hours as needed for anxiety.         Marland Kitchen esomeprazole (NEXIUM) 40 MG capsule   Oral   Take 40 mg by mouth daily as needed (acid reflux).          Marland Kitchen glipiZIDE (GLUCOTROL) 10 MG tablet   Oral   Take 10 mg by mouth 2 (two) times daily before a meal.          . HYDROcodone-acetaminophen (NORCO/VICODIN) 5-325 MG per tablet      Take 1 tablet every 6 hours as needed for moderate pain   120 tablet   5   . LIDOCAINE EX   Apply externally   Apply 1 application topically See admin instructions. Use 1 hour prior to dialysis as needed for pain on Mon, Wed and Fri.         . multivitamin (RENA-VIT) TABS tablet   Oral   Take 1 tablet by mouth daily.         Marland Kitchen oxyCODONE-acetaminophen (PERCOCET/ROXICET) 5-325 MG per tablet   Oral   Take 1-2 tablets by mouth every 8 (eight) hours as  needed for pain.   20 tablet   0    BP 165/97  Pulse 95  Temp(Src) 97.2 F (36.2 C) (Oral)  Resp 16  SpO2 100%  LMP 06/22/2012 Physical Exam  Nursing note and vitals reviewed. Constitutional: She appears well-developed and well-nourished. No distress.  HENT:  Head: Normocephalic and atraumatic.  Eyes: Pupils are equal, round, and reactive to light.  Neck: Normal range of motion. Neck supple.  Cardiovascular: Normal rate and regular rhythm.   Pulmonary/Chest: Effort normal.  Abdominal: Soft.  Musculoskeletal:       Feet:  Partial amputation to right foot. Wound flaps have not yet healed closed yet. Their is a clear/yellow fluid inside wound. Edges are clean and are not red or erythematous. No induration to skin. No increased swelling or discharged to foot.  Pedal pulse is palpable.  Neurological: She is alert.  Skin: Skin is warm and dry.    ED Course  Procedures (including critical care time) Labs Reviewed - No data to display No results found. 1. Foot pain, right     MDM  Long discussion with patient on symptoms that warrant return.   Not currently on abx, wound care doctor did not feel she needed them. Her vitals are stable, pain is mildly increased. Will give her short course of stronger pain medication. She says she otherwise feels really well, good energy level. She is to be re-evaluated at wound care clinic this Thursday.   52 y.o.[name]'s evaluation in the Emergency Department is complete. It has been determined that no acute conditions requiring further emergency intervention are present at this time. The patient/guardian have been advised of the diagnosis and plan. We have discussed signs and symptoms that warrant return to the ED, such as changes or worsening in symptoms.  Vital signs are stable at discharge. Filed Vitals:   10/19/12 1336  BP: 165/97  Pulse: 95  Temp: 97.2 F (36.2 C)  Resp: 16    Patient/guardian has voiced understanding and  agreed to follow-up with the PCP or specialist.   Dorthula Matas, PA-C 10/19/12 1405

## 2012-10-20 DIAGNOSIS — Z23 Encounter for immunization: Secondary | ICD-10-CM | POA: Diagnosis not present

## 2012-10-20 DIAGNOSIS — D509 Iron deficiency anemia, unspecified: Secondary | ICD-10-CM | POA: Diagnosis not present

## 2012-10-20 DIAGNOSIS — N186 End stage renal disease: Secondary | ICD-10-CM | POA: Diagnosis not present

## 2012-10-21 DIAGNOSIS — W1841XD Slipping, tripping and stumbling without falling due to stepping on object, subsequent encounter: Secondary | ICD-10-CM

## 2012-10-21 HISTORY — DX: Slipping, tripping and stumbling without falling due to stepping on object, subsequent encounter: W18.41XD

## 2012-10-21 NOTE — ED Provider Notes (Signed)
Medical screening examination/treatment/procedure(s) were performed by non-physician practitioner and as supervising physician I was immediately available for consultation/collaboration.   Vinette Crites M Estefani Bateson, DO 10/21/12 1258 

## 2012-10-22 DIAGNOSIS — D509 Iron deficiency anemia, unspecified: Secondary | ICD-10-CM | POA: Diagnosis not present

## 2012-10-22 DIAGNOSIS — D631 Anemia in chronic kidney disease: Secondary | ICD-10-CM | POA: Diagnosis not present

## 2012-10-22 DIAGNOSIS — N186 End stage renal disease: Secondary | ICD-10-CM | POA: Diagnosis not present

## 2012-10-23 ENCOUNTER — Encounter (HOSPITAL_BASED_OUTPATIENT_CLINIC_OR_DEPARTMENT_OTHER): Payer: Medicare Other | Attending: General Surgery

## 2012-10-23 DIAGNOSIS — E1169 Type 2 diabetes mellitus with other specified complication: Secondary | ICD-10-CM | POA: Insufficient documentation

## 2012-10-23 DIAGNOSIS — L97509 Non-pressure chronic ulcer of other part of unspecified foot with unspecified severity: Secondary | ICD-10-CM | POA: Insufficient documentation

## 2012-10-23 LAB — GLUCOSE, CAPILLARY: Glucose-Capillary: 175 mg/dL — ABNORMAL HIGH (ref 70–99)

## 2012-10-29 ENCOUNTER — Emergency Department (HOSPITAL_COMMUNITY)
Admission: EM | Admit: 2012-10-29 | Discharge: 2012-10-29 | Disposition: A | Discharge status: Home / Self Care | Payer: Medicare Other | Attending: Emergency Medicine | Admitting: Emergency Medicine

## 2012-10-29 ENCOUNTER — Encounter (HOSPITAL_COMMUNITY): Payer: Self-pay

## 2012-10-29 DIAGNOSIS — Z79899 Other long term (current) drug therapy: Secondary | ICD-10-CM | POA: Diagnosis not present

## 2012-10-29 DIAGNOSIS — G8929 Other chronic pain: Secondary | ICD-10-CM | POA: Diagnosis not present

## 2012-10-29 DIAGNOSIS — Z862 Personal history of diseases of the blood and blood-forming organs and certain disorders involving the immune mechanism: Secondary | ICD-10-CM | POA: Insufficient documentation

## 2012-10-29 DIAGNOSIS — E1129 Type 2 diabetes mellitus with other diabetic kidney complication: Secondary | ICD-10-CM | POA: Insufficient documentation

## 2012-10-29 DIAGNOSIS — Z8742 Personal history of other diseases of the female genital tract: Secondary | ICD-10-CM | POA: Diagnosis not present

## 2012-10-29 DIAGNOSIS — Z7982 Long term (current) use of aspirin: Secondary | ICD-10-CM | POA: Insufficient documentation

## 2012-10-29 DIAGNOSIS — K219 Gastro-esophageal reflux disease without esophagitis: Secondary | ICD-10-CM | POA: Insufficient documentation

## 2012-10-29 DIAGNOSIS — E213 Hyperparathyroidism, unspecified: Secondary | ICD-10-CM | POA: Insufficient documentation

## 2012-10-29 DIAGNOSIS — Z8701 Personal history of pneumonia (recurrent): Secondary | ICD-10-CM | POA: Diagnosis not present

## 2012-10-29 DIAGNOSIS — G8928 Other chronic postprocedural pain: Secondary | ICD-10-CM | POA: Diagnosis not present

## 2012-10-29 DIAGNOSIS — N186 End stage renal disease: Secondary | ICD-10-CM | POA: Insufficient documentation

## 2012-10-29 DIAGNOSIS — Z8679 Personal history of other diseases of the circulatory system: Secondary | ICD-10-CM | POA: Diagnosis not present

## 2012-10-29 DIAGNOSIS — M79609 Pain in unspecified limb: Secondary | ICD-10-CM | POA: Diagnosis not present

## 2012-10-29 DIAGNOSIS — Z8673 Personal history of transient ischemic attack (TIA), and cerebral infarction without residual deficits: Secondary | ICD-10-CM | POA: Diagnosis not present

## 2012-10-29 DIAGNOSIS — S98919A Complete traumatic amputation of unspecified foot, level unspecified, initial encounter: Secondary | ICD-10-CM | POA: Insufficient documentation

## 2012-10-29 DIAGNOSIS — I120 Hypertensive chronic kidney disease with stage 5 chronic kidney disease or end stage renal disease: Secondary | ICD-10-CM | POA: Diagnosis not present

## 2012-10-29 MED ORDER — OXYCODONE-ACETAMINOPHEN 5-325 MG PO TABS: 2.0000 | ORAL_TABLET | Freq: Four times a day (QID) | ORAL | Status: DC | PRN

## 2012-10-29 NOTE — ED Notes (Addendum)
Lt. Foot amputation done on March 29th.  The wound has not healed and is opened.  Pt. Is doing wound care but the foot has become too sensitive and painful. The wound bed is greenish yellow.  She also does dialysis Monday, Wednesday and Friday.

## 2012-10-29 NOTE — ED Notes (Signed)
Pt tired of waiting wait time given next to go back

## 2012-10-29 NOTE — ED Provider Notes (Signed)
History    CSN: [id number] Arrival date & time 10/29/12  1741  First MD Initiated Contact with Patient 10/29/12 2124     Chief Complaint  Patient presents with  . Wound Infection   (Consider location/radiation/quality/duration/timing/severity/associated sxs/prior Treatment) HPI Comments: Patient presents emergency department with chief complaint of left foot pain. 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 She states that she has been having increasing pain in the left foot. She states that she tried to call her surgeon, but he is out of town. She states that she has run out of her pain medication. She denies running any fever. She states that she has noticed some increasing yellow discharge around the wound, and would like to be checked for infection. She is being followed by wound care.  The history is provided by the patient. No language interpreter was used.   Past Medical History  Diagnosis Date  . ESRD (end stage renal disease)   . Hypertension   . CVA (cerebral infarction)     right parietal 05/19/2000  . Vaginal bleeding   . Hyperparathyroidism   . Pneumonia   . Stroke     2002,no residual  . Peripheral vascular disease   . GERD (gastroesophageal reflux disease)   . Anemia   . DM (diabetes mellitus) type II controlled peripheral vascular disorder    Past Surgical History  Procedure Laterality Date  . Parathyroidectomy with autotransplant  12/07/2010  . Knee surgery      right, x2  . Brachial artery graft      x2 for dialysis  . Av fistula placement      upper rt thigh  . Dental extractions Bilateral   . Dilation and curettage of uterus      hx: of 1986  . 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 Hypertension Father   . Kidney  disease Mother    History  Substance Use Topics  . Smoking status: Never Smoker   . Smokeless tobacco: Never Used  . Alcohol Use: Yes     Comment: occasional   OB History   Grav Para Term Preterm Abortions TAB SAB Ect Mult Living                 Review of Systems  All other systems reviewed and are negative.    Allergies  Doxycycline and Peroxyl  Home Medications   Current Outpatient Rx  Name  Route  Sig  Dispense  Refill  . Acetaminophen (TYLENOL PO)   Oral   Take 1-2 tablets by mouth See admin instructions. Uses at dialysis Mon, Wed, Fridays each week for muscle spasms         . amLODipine (NORVASC) 10 MG tablet   Oral   Take 10 mg by mouth at bedtime.          Marland Kitchen aspirin 81 MG tablet   Oral   Take 81 mg by mouth daily.           . calcium carbonate (TUMS - DOSED IN MG ELEMENTAL CALCIUM) 500 MG chewable tablet   Oral   Chew 2 tablets by mouth 3 (three) times daily with meals.          . diazepam (VALIUM) 2 MG tablet  Oral   Take 2 mg by mouth every 6 (six) hours as needed for anxiety.         Marland Kitchen esomeprazole (NEXIUM) 40 MG capsule   Oral   Take 40 mg by mouth daily as needed (acid reflux).          Marland Kitchen glipiZIDE (GLUCOTROL) 10 MG tablet   Oral   Take 10 mg by mouth 2 (two) times daily before a meal.          . HYDROcodone-acetaminophen (NORCO/VICODIN) 5-325 MG per tablet      Take 1 tablet every 6 hours as needed for moderate pain   120 tablet   5   . LIDOCAINE EX   Apply externally   Apply 1 application topically See admin instructions. Use 1 hour prior to dialysis as needed for pain on Mon, Wed and Fri.         . multivitamin (RENA-VIT) TABS tablet   Oral   Take 1 tablet by mouth daily.         Marland Kitchen oxyCODONE-acetaminophen (PERCOCET/ROXICET) 5-325 MG per tablet   Oral   Take 1-2 tablets by mouth every 8 (eight) hours as needed for pain.   20 tablet   0    BP 120/79  Pulse 102  Temp(Src) 98.6 F (37 C) (Oral)  Resp 18   SpO2 100%  LMP 06/22/2012 Physical Exam  Nursing note and vitals reviewed. Constitutional: She is oriented to person, place, and time. She appears well-developed and well-nourished.  HENT:  Head: Normocephalic and atraumatic.  Eyes: Conjunctivae and EOM are normal. Pupils are equal, round, and reactive to light.  Neck: Normal range of motion. Neck supple.  Cardiovascular: Normal rate, regular rhythm and intact distal pulses.  Exam reveals no gallop and no friction rub.   No murmur heard. Pulmonary/Chest: Effort normal and breath sounds normal. No respiratory distress. She has no wheezes. She has no rales. She exhibits no tenderness.  Abdominal: Soft. Bowel sounds are normal. She exhibits no distension and no mass. There is no tenderness. There is no rebound and no guarding.  Musculoskeletal: Normal range of motion. She exhibits no edema and no tenderness.       Feet:  Left foot partial amputation as diagrammed, wound flaps have still not closed, surrounding tissue is remarkable for very mild yellowish discharge, and the surrounding skin is not red or inflamed.  Neurological: She is alert and oriented to person, place, and time.  Skin: Skin is warm and dry.  Psychiatric: She has a normal mood and affect. Her behavior is normal. Judgment and thought content normal.    ED Course  Procedures (including critical care time) Labs Reviewed - No data to display No results found. 1. Chronic pain     MDM  Patient with increasing foot pain following amputation. The wound does not appear acutely infected. She has good pedal pulses. She has good followup with wound care and with her surgeon. Will treat the patient with some pain medicine, and she has used all of hers. Will instruct the patient that she cannot expect another refill from the emergency department.  Patient seen by and discussed with Dr. Bebe Shaggy, who agrees that this is a chronic pain issue.  No evidence of infection.  Will discharge  the patient to home with PCP follow-up.  Roxy Horseman, PA-C 10/29/12 2332

## 2012-10-30 DIAGNOSIS — E1169 Type 2 diabetes mellitus with other specified complication: Secondary | ICD-10-CM | POA: Diagnosis not present

## 2012-10-30 DIAGNOSIS — L97509 Non-pressure chronic ulcer of other part of unspecified foot with unspecified severity: Secondary | ICD-10-CM | POA: Diagnosis not present

## 2012-11-01 NOTE — ED Provider Notes (Signed)
Medical screening examination/treatment/procedure(s) were conducted as a shared visit with non-physician practitioner(s) and myself.  I personally evaluated the patient during the encounter   Pt stable in the ED, suspect chronic pain, advised f/u as outpatient  Joya Gaskins, MD 11/01/12 [phone]

## 2012-11-06 DIAGNOSIS — E1169 Type 2 diabetes mellitus with other specified complication: Secondary | ICD-10-CM | POA: Diagnosis not present

## 2012-11-06 DIAGNOSIS — L97509 Non-pressure chronic ulcer of other part of unspecified foot with unspecified severity: Secondary | ICD-10-CM | POA: Diagnosis not present

## 2012-11-07 LAB — GLUCOSE, CAPILLARY: Glucose-Capillary: 207 mg/dL — ABNORMAL HIGH (ref 70–99)

## 2012-11-12 DIAGNOSIS — E1129 Type 2 diabetes mellitus with other diabetic kidney complication: Secondary | ICD-10-CM | POA: Diagnosis not present

## 2012-11-13 DIAGNOSIS — E1169 Type 2 diabetes mellitus with other specified complication: Secondary | ICD-10-CM | POA: Diagnosis not present

## 2012-11-13 DIAGNOSIS — L97509 Non-pressure chronic ulcer of other part of unspecified foot with unspecified severity: Secondary | ICD-10-CM | POA: Diagnosis not present

## 2012-11-17 DIAGNOSIS — Z23 Encounter for immunization: Secondary | ICD-10-CM | POA: Diagnosis not present

## 2012-11-18 ENCOUNTER — Encounter: Payer: Self-pay | Admitting: Vascular Surgery

## 2012-11-19 ENCOUNTER — Encounter: Payer: Self-pay | Admitting: Vascular Surgery

## 2012-11-19 ENCOUNTER — Ambulatory Visit (INDEPENDENT_AMBULATORY_CARE_PROVIDER_SITE_OTHER): Payer: Medicare Other | Admitting: Vascular Surgery

## 2012-11-19 ENCOUNTER — Ambulatory Visit: Payer: Medicare Other | Admitting: Vascular Surgery

## 2012-11-19 VITALS — BP 128/67 | HR 102 | Resp 16 | Ht 62.0 in | Wt 152.0 lb

## 2012-11-19 DIAGNOSIS — M79609 Pain in unspecified limb: Secondary | ICD-10-CM | POA: Diagnosis not present

## 2012-11-19 DIAGNOSIS — I739 Peripheral vascular disease, unspecified: Secondary | ICD-10-CM | POA: Diagnosis not present

## 2012-11-19 NOTE — Progress Notes (Signed)
Patient name: [name] MRN: [id number] DOB: [birth year] Sex: female  REASON FOR VISIT: follow up of left forefoot amputation.  HPI: [name] is a 52 y.o. female who had presented with gangrene of her left forefoot. She underwent endovascular revascularization on the left by Dr. Myra Gianotti. She had two-vessel runoff via the posterior tibial and anterior tibial artery on the left. I performed a left transmetatarsal amputation on 07/15/2012. She is followed in the wound care center. She denies fever or chills. He comes in for a wound check. She has had no significant pain in the foot except when they attempted to debride the wound.  REVIEW OF SYSTEMS: Arly.Keller ] denotes positive finding; [  ] denotes negative finding  CARDIOVASCULAR:  [ ]  chest pain   [ ]  dyspnea on exertion    CONSTITUTIONAL:  [ ]  fever   [ ]  chills  PHYSICAL EXAM: Filed Vitals:   11/19/12 1612  BP: 128/67  Pulse: 102  Resp: 16  Height: 5\' 2"  (1.575 m)  Weight: 152 lb (68.947 kg)  SpO2: 100%   Body mass index is 27.79 kg/(m^2). GENERAL: The patient is a well-nourished female, in no acute distress. The vital signs are documented above. CARDIOVASCULAR: There is a regular rate and rhythm  PULMONARY: There is good air exchange bilaterally without wheezing or rales. He has a brisk dorsalis pedis signal with the Doppler on the left. The transmetatarsal amputation site is open along its length with granulation tissue beneath some fibrin. I debrided some dead scan in the office today to  MEDICAL ISSUES: I think this wound has a reasonable chance of healing with continued aggressive wound care. He appears to have adequate circulation based on a brisk Doppler dorsalis pedis signal on the left. She will continue to follow up with the wound care center. I will see her back in 2 months. She knows to call sooner if she has problems.  [name] S [facility] Beeper: [phone]

## 2012-11-20 ENCOUNTER — Telehealth: Payer: Self-pay

## 2012-11-20 DIAGNOSIS — IMO0002 Reserved for concepts with insufficient information to code with codable children: Secondary | ICD-10-CM

## 2012-11-20 DIAGNOSIS — N186 End stage renal disease: Secondary | ICD-10-CM | POA: Diagnosis not present

## 2012-11-20 NOTE — Telephone Encounter (Signed)
Phone call from pt.  Requesting "Hydrocodone" for pain.  States since Dr. Edilia Bo debrided her left foot yesterday, her foot has been hurting; states "he took off 2 strips of skin that was the size of thick rubber bands."  States she fell in her closet this morning because her foot hurts so much.  Questioned about level of pain.  Pt. rated her left foot pain at "2/10".  Recommended she try Extra Strength Tylenol for the pain.  States that Tylenol doesn't help, but denies trying any Tylenol since office visit/ wound debridement done yesterday.  States "I've been in bed all day, it hurts so much...my foot isn't dead you know."  Advised will send message to Dr. Edilia Bo;  advised pt. will contact her after further recommendation by Dr. Edilia Bo.

## 2012-11-21 DIAGNOSIS — N186 End stage renal disease: Secondary | ICD-10-CM | POA: Diagnosis not present

## 2012-11-21 DIAGNOSIS — D631 Anemia in chronic kidney disease: Secondary | ICD-10-CM | POA: Diagnosis not present

## 2012-11-21 MED ORDER — HYDROCODONE-ACETAMINOPHEN 5-325 MG PO TABS: ORAL_TABLET | ORAL | Status: DC

## 2012-11-21 NOTE — Telephone Encounter (Signed)
Rec'd verbal order per Dr. Edilia Bo to order Hydrocodone/ Acetaminophen 5/325 mg, 1-2 tabs po. q 4-6 hrs/ prn; #15, no refills.  Notified pt. of medication order being called to her pharmacy.

## 2012-11-24 DIAGNOSIS — N039 Chronic nephritic syndrome with unspecified morphologic changes: Secondary | ICD-10-CM | POA: Diagnosis not present

## 2012-11-24 DIAGNOSIS — N186 End stage renal disease: Secondary | ICD-10-CM | POA: Diagnosis not present

## 2012-11-26 DIAGNOSIS — N186 End stage renal disease: Secondary | ICD-10-CM | POA: Diagnosis not present

## 2012-11-26 DIAGNOSIS — N039 Chronic nephritic syndrome with unspecified morphologic changes: Secondary | ICD-10-CM | POA: Diagnosis not present

## 2012-11-28 DIAGNOSIS — D631 Anemia in chronic kidney disease: Secondary | ICD-10-CM | POA: Diagnosis not present

## 2012-11-28 DIAGNOSIS — N186 End stage renal disease: Secondary | ICD-10-CM | POA: Diagnosis not present

## 2012-12-01 DIAGNOSIS — D631 Anemia in chronic kidney disease: Secondary | ICD-10-CM | POA: Diagnosis not present

## 2012-12-01 DIAGNOSIS — N186 End stage renal disease: Secondary | ICD-10-CM | POA: Diagnosis not present

## 2012-12-03 DIAGNOSIS — D631 Anemia in chronic kidney disease: Secondary | ICD-10-CM | POA: Diagnosis not present

## 2012-12-03 DIAGNOSIS — N186 End stage renal disease: Secondary | ICD-10-CM | POA: Diagnosis not present

## 2012-12-04 DIAGNOSIS — T82898A Other specified complication of vascular prosthetic devices, implants and grafts, initial encounter: Secondary | ICD-10-CM | POA: Diagnosis not present

## 2012-12-04 DIAGNOSIS — I871 Compression of vein: Secondary | ICD-10-CM | POA: Diagnosis not present

## 2012-12-04 DIAGNOSIS — N186 End stage renal disease: Secondary | ICD-10-CM | POA: Diagnosis not present

## 2012-12-05 DIAGNOSIS — N186 End stage renal disease: Secondary | ICD-10-CM | POA: Diagnosis not present

## 2012-12-05 DIAGNOSIS — D631 Anemia in chronic kidney disease: Secondary | ICD-10-CM | POA: Diagnosis not present

## 2012-12-08 DIAGNOSIS — D631 Anemia in chronic kidney disease: Secondary | ICD-10-CM | POA: Diagnosis not present

## 2012-12-08 DIAGNOSIS — N186 End stage renal disease: Secondary | ICD-10-CM | POA: Diagnosis not present

## 2012-12-09 ENCOUNTER — Emergency Department (INDEPENDENT_AMBULATORY_CARE_PROVIDER_SITE_OTHER)
Admission: EM | Admit: 2012-12-09 | Discharge: 2012-12-09 | Disposition: A | Discharge status: Home / Self Care | Payer: Medicare Other | Source: Home / Self Care | Attending: Emergency Medicine | Admitting: Emergency Medicine

## 2012-12-09 ENCOUNTER — Encounter (HOSPITAL_COMMUNITY): Payer: Self-pay | Admitting: Emergency Medicine

## 2012-12-09 ENCOUNTER — Other Ambulatory Visit: Payer: Self-pay

## 2012-12-09 DIAGNOSIS — M79672 Pain in left foot: Secondary | ICD-10-CM

## 2012-12-09 DIAGNOSIS — Z1231 Encounter for screening mammogram for malignant neoplasm of breast: Secondary | ICD-10-CM

## 2012-12-09 DIAGNOSIS — M79609 Pain in unspecified limb: Secondary | ICD-10-CM

## 2012-12-09 DIAGNOSIS — Z5189 Encounter for other specified aftercare: Secondary | ICD-10-CM

## 2012-12-09 MED ORDER — "TELFA NON-ADHERENT DESSING 2""X3"" PADS": 1.0000 [IU] | MEDICATED_PAD | Freq: Every day | Status: DC

## 2012-12-09 MED ORDER — VASELINE PETROLATUM GAUZE EX PADS: MEDICATED_PAD | CUTANEOUS | Status: DC

## 2012-12-09 NOTE — ED Notes (Signed)
C/o left foot pain. ? If infection is present. Hx amputation due to gangrene. Pt also states that she is in need of dressing. Having pain. States next appt isn't until 2 months with primary.

## 2012-12-09 NOTE — ED Notes (Signed)
Applied xs post op shoe. Kelix. telfa and xeroform  With 3 inch ace. Per Nida Boatman EMT

## 2012-12-09 NOTE — ED Provider Notes (Signed)
Medical screening examination/treatment/procedure(s) were performed by non-physician practitioner and as supervising physician I was immediately available for consultation/collaboration.  Leslee Home, M.D.  Reuben Likes, MD 12/09/12 [phone]

## 2012-12-09 NOTE — ED Provider Notes (Signed)
CSN: [id number]     Arrival date & time 12/09/12  1700 History     None    Chief Complaint  Patient presents with  . Foot Pain    left foot pain. several days.     (Consider location/radiation/quality/duration/timing/severity/associated sxs/prior Treatment) HPI Comments: 52 year old female who is status post transmetatarsal amputation of her left foot a few months ago presents requesting a wound check to make sure her foot is not affected, also requesting a new postop shoe. She has no symptoms to suggest infection such as increased pain, discharge, fever, she just says she wants to get checked. She does admit to a mild infrequent spasm of pain but is not experiencing this right. She is currently being followed with wound care and with vascular surgery, however she does not have another appointment until one month from now.  Patient is a 52 y.o. female presenting with lower extremity pain.  Foot Pain Pertinent negatives include no chest pain, no abdominal pain and no shortness of breath.    Past Medical History  Diagnosis Date  . ESRD (end stage renal disease)   . Hypertension   . CVA (cerebral infarction)     right parietal 05/19/2000  . Vaginal bleeding   . Hyperparathyroidism   . Pneumonia   . Stroke     2002,no residual  . Peripheral vascular disease   . GERD (gastroesophageal reflux disease)   . Anemia   . DM (diabetes mellitus) type II controlled peripheral vascular disorder   . Slip/trip w/o falling due to stepping on object, subs July  2014   Past Surgical History  Procedure Laterality Date  . Parathyroidectomy with autotransplant  12/07/2010  . Knee surgery      right, x2  . Brachial artery graft      x2 for dialysis  . Av fistula placement      upper rt thigh  . Dental extractions Bilateral   . Dilation and curettage of uterus      hx: of 1986  . 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 Hypertension Father   . Kidney disease Mother    History  Substance Use Topics  . Smoking status: Never Smoker   . Smokeless tobacco: Never Used  . Alcohol Use: Yes     Comment: occasional   OB History   Grav Para Term Preterm Abortions TAB SAB Ect Mult Living                 Review of Systems  Constitutional: Negative for fever and chills.  Eyes: Negative for visual disturbance.  Respiratory: Negative for cough and shortness of breath.   Cardiovascular: Negative for chest pain, palpitations and leg swelling.  Gastrointestinal: Negative for nausea, vomiting and abdominal pain.  Endocrine: Negative for polydipsia and polyuria.  Genitourinary: Negative for dysuria, urgency and frequency.  Musculoskeletal:       See history of present illness  Skin: Negative for rash.  Neurological: Negative for dizziness, weakness and light-headedness.    Allergies  Doxycycline and Peroxyl  Home Medications   Current Outpatient Rx  Name  Route  Sig  Dispense  Refill  . amLODipine (NORVASC) 10 MG tablet   Oral   Take 10 mg by mouth at bedtime.          Marland Kitchen  aspirin 81 MG tablet   Oral   Take 81 mg by mouth daily.           . diazepam (VALIUM) 2 MG tablet   Oral   Take 2 mg by mouth every 6 (six) hours as needed for anxiety.         Marland Kitchen esomeprazole (NEXIUM) 40 MG capsule   Oral   Take 40 mg by mouth daily as needed (acid reflux).          Marland Kitchen glipiZIDE (GLUCOTROL) 10 MG tablet   Oral   Take 10 mg by mouth 2 (two) times daily before a meal.          . Acetaminophen (TYLENOL PO)   Oral   Take 1-2 tablets by mouth See admin instructions. Uses at dialysis Mon, Wed, Fridays each week for muscle spasms         . calcium carbonate (TUMS - DOSED IN MG ELEMENTAL CALCIUM) 500 MG chewable tablet   Oral   Chew 2 tablets by mouth 3 (three) times daily with meals.            . enalapril (VASOTEC) 5 MG tablet               . Gauze Pads & Dressings (TELFA NON-ADHERENT DESSING) 2"X3" PADS   Does not apply   1 Units by Does not apply route daily.   100 each   1   . HYDROcodone-acetaminophen (NORCO) 5-325 MG per tablet      Take 1-2 tablets every 4-6 hrs/ prn/ pain   15 tablet   0   . LIDOCAINE EX   Apply externally   Apply 1 application topically See admin instructions. Use 1 hour prior to dialysis as needed for pain on Mon, Wed and Fri.         . multivitamin (RENA-VIT) TABS tablet   Oral   Take 1 tablet by mouth daily.         Marland Kitchen oxyCODONE-acetaminophen (PERCOCET/ROXICET) 5-325 MG per tablet   Oral   Take 1-2 tablets by mouth every 8 (eight) hours as needed for pain.   20 tablet   0   . oxyCODONE-acetaminophen (PERCOCET/ROXICET) 5-325 MG per tablet   Oral   Take 2 tablets by mouth every 6 (six) hours as needed for pain.   15 tablet   0   . SANTYL ointment               . traMADol (ULTRAM) 50 MG tablet               . Wound Dressings (VASELINE PETROLATUM GAUZE) PADS      Apply with wound dressings   50 each   0    BP 172/80  Pulse 100  Temp(Src) 98.6 F (37 C) (Oral)  Resp 18  SpO2 100% Physical Exam  Nursing note and vitals reviewed. Constitutional: She is oriented to person, place, and time. She appears well-developed and well-nourished. No distress.  HENT:  Head: Normocephalic and atraumatic.  Cardiovascular: Intact distal pulses and normal pulses.   Pulmonary/Chest: Effort normal.  Musculoskeletal:       Feet:  Neurological: She is alert and oriented to person, place, and time. No cranial nerve deficit. She exhibits normal muscle tone. Coordination normal.  Skin: Skin is warm and dry. No rash noted. She is not diaphoretic.  Psychiatric: She has a normal mood and affect. Judgment normal.    ED Course   Procedures (  including critical care time)  Labs Reviewed - No data to display No results  found. 1. Visit for wound check   2. Foot pain, left     MDM  This wound appears to be healing appropriately. She should continue to clean it and just as directed by wound care and her vascular surgeon. We will give her a new, more appropriately sized postoperative shoe. We will dress this with petroleum gauze and nonadherent dressing. Followup if any worsening.  Graylon Good, PA-C 12/09/12 1843

## 2012-12-10 DIAGNOSIS — N186 End stage renal disease: Secondary | ICD-10-CM | POA: Diagnosis not present

## 2012-12-10 DIAGNOSIS — D631 Anemia in chronic kidney disease: Secondary | ICD-10-CM | POA: Diagnosis not present

## 2012-12-11 ENCOUNTER — Other Ambulatory Visit: Payer: Self-pay | Admitting: Geriatric Medicine

## 2012-12-12 DIAGNOSIS — N186 End stage renal disease: Secondary | ICD-10-CM | POA: Diagnosis not present

## 2012-12-12 DIAGNOSIS — D631 Anemia in chronic kidney disease: Secondary | ICD-10-CM | POA: Diagnosis not present

## 2012-12-15 DIAGNOSIS — D631 Anemia in chronic kidney disease: Secondary | ICD-10-CM | POA: Diagnosis not present

## 2012-12-15 DIAGNOSIS — N186 End stage renal disease: Secondary | ICD-10-CM | POA: Diagnosis not present

## 2012-12-17 DIAGNOSIS — N186 End stage renal disease: Secondary | ICD-10-CM | POA: Diagnosis not present

## 2012-12-17 DIAGNOSIS — D631 Anemia in chronic kidney disease: Secondary | ICD-10-CM | POA: Diagnosis not present

## 2012-12-19 DIAGNOSIS — N186 End stage renal disease: Secondary | ICD-10-CM | POA: Diagnosis not present

## 2012-12-19 DIAGNOSIS — D631 Anemia in chronic kidney disease: Secondary | ICD-10-CM | POA: Diagnosis not present

## 2012-12-21 DIAGNOSIS — N186 End stage renal disease: Secondary | ICD-10-CM | POA: Diagnosis not present

## 2012-12-22 DIAGNOSIS — N186 End stage renal disease: Secondary | ICD-10-CM | POA: Diagnosis not present

## 2012-12-22 DIAGNOSIS — D631 Anemia in chronic kidney disease: Secondary | ICD-10-CM | POA: Diagnosis not present

## 2012-12-22 DIAGNOSIS — Z23 Encounter for immunization: Secondary | ICD-10-CM | POA: Diagnosis not present

## 2012-12-24 DIAGNOSIS — D631 Anemia in chronic kidney disease: Secondary | ICD-10-CM | POA: Diagnosis not present

## 2012-12-24 DIAGNOSIS — Z23 Encounter for immunization: Secondary | ICD-10-CM | POA: Diagnosis not present

## 2012-12-24 DIAGNOSIS — N186 End stage renal disease: Secondary | ICD-10-CM | POA: Diagnosis not present

## 2012-12-25 ENCOUNTER — Encounter (HOSPITAL_COMMUNITY): Payer: Self-pay | Admitting: Emergency Medicine

## 2012-12-25 ENCOUNTER — Emergency Department (INDEPENDENT_AMBULATORY_CARE_PROVIDER_SITE_OTHER)
Admission: EM | Admit: 2012-12-25 | Discharge: 2012-12-25 | Disposition: A | Discharge status: Home / Self Care | Payer: Medicare Other | Source: Home / Self Care | Attending: Family Medicine | Admitting: Family Medicine

## 2012-12-25 DIAGNOSIS — M79609 Pain in unspecified limb: Secondary | ICD-10-CM

## 2012-12-25 MED ORDER — SULFAMETHOXAZOLE-TMP DS 800-160 MG PO TABS: ORAL_TABLET | ORAL | Status: DC

## 2012-12-25 NOTE — ED Notes (Signed)
C/o left foot pain due to amputation.   Patient says the pain is sharp and aching.   Procedure was done in March.

## 2012-12-25 NOTE — ED Provider Notes (Signed)
[name] is a 52 y.o. female who presents to Urgent Care today for left foot pain. Patient had a transmetatarsal amputation in March. This is been complicated by poor wound healing. She notes that she was in her normal state of health until recently when she began to experience worsening pain surrounding the wound and especially on the plantar aspect of the foot. She feels it is warm to touch and mildly tender. She denies any fevers or chills. She feels well otherwise.    Past Medical History  Diagnosis Date  . ESRD (end stage renal disease)   . Hypertension   . CVA (cerebral infarction)     right parietal 05/19/2000  . Vaginal bleeding   . Hyperparathyroidism   . Pneumonia   . Stroke     2002,no residual  . Peripheral vascular disease   . GERD (gastroesophageal reflux disease)   . Anemia   . DM (diabetes mellitus) type II controlled peripheral vascular disorder   . Slip/trip w/o falling due to stepping on object, subs July  2014   History  Substance Use Topics  . Smoking status: Never Smoker   . Smokeless tobacco: Never Used  . Alcohol Use: Yes     Comment: occasional   ROS as above Medications reviewed. No current facility-administered medications for this encounter.   Current Outpatient Prescriptions  Medication Sig Dispense Refill  . Acetaminophen (TYLENOL PO) Take 1-2 tablets by mouth See admin instructions. Uses at dialysis Mon, Wed, Fridays each week for muscle spasms      . amLODipine (NORVASC) 10 MG tablet Take 10 mg by mouth at bedtime.       Marland Kitchen aspirin 81 MG tablet Take 81 mg by mouth daily.        . calcium carbonate (TUMS - DOSED IN MG ELEMENTAL CALCIUM) 500 MG chewable tablet Chew 2 tablets by mouth 3 (three) times daily with meals.       . diazepam (VALIUM) 2 MG tablet Take 2 mg by mouth every 6 (six) hours as needed for anxiety.      . enalapril (VASOTEC) 5 MG tablet       . esomeprazole (NEXIUM) 40 MG capsule Take 40 mg by mouth daily as needed (acid  reflux).       . Gauze Pads & Dressings (TELFA NON-ADHERENT DESSING) 2"X3" PADS 1 Units by Does not apply route daily.  100 each  1  . glipiZIDE (GLUCOTROL) 10 MG tablet Take 10 mg by mouth 2 (two) times daily before a meal.       . HYDROcodone-acetaminophen (NORCO) 5-325 MG per tablet Take 1-2 tablets every 4-6 hrs/ prn/ pain  15 tablet  0  . LIDOCAINE EX Apply 1 application topically See admin instructions. Use 1 hour prior to dialysis as needed for pain on Mon, Wed and Fri.      . multivitamin (RENA-VIT) TABS tablet Take 1 tablet by mouth daily.      Marland Kitchen oxyCODONE-acetaminophen (PERCOCET/ROXICET) 5-325 MG per tablet Take 1-2 tablets by mouth every 8 (eight) hours as needed for pain.  20 tablet  0  . oxyCODONE-acetaminophen (PERCOCET/ROXICET) 5-325 MG per tablet Take 2 tablets by mouth every 6 (six) hours as needed for pain.  15 tablet  0  . SANTYL ointment       . sulfamethoxazole-trimethoprim (BACTRIM DS) 800-160 MG per tablet 1 po three times a week after dialysis  6 tablet  0  . traMADol (ULTRAM) 50 MG tablet       .  Wound Dressings (VASELINE PETROLATUM GAUZE) PADS Apply with wound dressings  50 each  0    Exam:  BP 166/82  Pulse 101  Temp(Src) 98.1 F (36.7 C) (Oral)  Resp 20  SpO2 100% Gen: Well NAD LEFT: Transmetatarsal education. Wound at the front aspect of the foot is about 4 cm x 1 cm with good granulation tissue.  The surrounding skin is mildly erythematous and mildly tender to the touch.    No results found for this or any previous visit (from the past 24 hour(s)). No results found.  Assessment and Plan: 52 y.o. female with possible wound infection.  Plan to treat with Bactrim as patient is allergic to doxycycline.  Discussed with the pharmacist the dose of Bactrim on dialysis.  Followup with her primary care provider or her vascular surgeon.  Discussed warning signs or symptoms. Please see discharge instructions. Patient expresses understanding.      Rodolph Bong, MD 12/25/12 [phone]

## 2012-12-26 DIAGNOSIS — N186 End stage renal disease: Secondary | ICD-10-CM | POA: Diagnosis not present

## 2012-12-26 DIAGNOSIS — D631 Anemia in chronic kidney disease: Secondary | ICD-10-CM | POA: Diagnosis not present

## 2012-12-26 DIAGNOSIS — Z23 Encounter for immunization: Secondary | ICD-10-CM | POA: Diagnosis not present

## 2012-12-29 DIAGNOSIS — N186 End stage renal disease: Secondary | ICD-10-CM | POA: Diagnosis not present

## 2012-12-29 DIAGNOSIS — Z23 Encounter for immunization: Secondary | ICD-10-CM | POA: Diagnosis not present

## 2012-12-29 DIAGNOSIS — D631 Anemia in chronic kidney disease: Secondary | ICD-10-CM | POA: Diagnosis not present

## 2012-12-30 DIAGNOSIS — Z992 Dependence on renal dialysis: Secondary | ICD-10-CM | POA: Diagnosis not present

## 2012-12-30 DIAGNOSIS — I739 Peripheral vascular disease, unspecified: Secondary | ICD-10-CM | POA: Diagnosis not present

## 2012-12-30 DIAGNOSIS — I70209 Unspecified atherosclerosis of native arteries of extremities, unspecified extremity: Secondary | ICD-10-CM | POA: Diagnosis not present

## 2012-12-30 DIAGNOSIS — E119 Type 2 diabetes mellitus without complications: Secondary | ICD-10-CM | POA: Diagnosis not present

## 2012-12-30 DIAGNOSIS — N186 End stage renal disease: Secondary | ICD-10-CM | POA: Diagnosis not present

## 2012-12-30 DIAGNOSIS — I120 Hypertensive chronic kidney disease with stage 5 chronic kidney disease or end stage renal disease: Secondary | ICD-10-CM | POA: Diagnosis not present

## 2012-12-31 DIAGNOSIS — D631 Anemia in chronic kidney disease: Secondary | ICD-10-CM | POA: Diagnosis not present

## 2012-12-31 DIAGNOSIS — N186 End stage renal disease: Secondary | ICD-10-CM | POA: Diagnosis not present

## 2012-12-31 DIAGNOSIS — Z23 Encounter for immunization: Secondary | ICD-10-CM | POA: Diagnosis not present

## 2013-01-02 DIAGNOSIS — Z23 Encounter for immunization: Secondary | ICD-10-CM | POA: Diagnosis not present

## 2013-01-02 DIAGNOSIS — N186 End stage renal disease: Secondary | ICD-10-CM | POA: Diagnosis not present

## 2013-01-02 DIAGNOSIS — D631 Anemia in chronic kidney disease: Secondary | ICD-10-CM | POA: Diagnosis not present

## 2013-01-05 DIAGNOSIS — Z23 Encounter for immunization: Secondary | ICD-10-CM | POA: Diagnosis not present

## 2013-01-05 DIAGNOSIS — N186 End stage renal disease: Secondary | ICD-10-CM | POA: Diagnosis not present

## 2013-01-05 DIAGNOSIS — D631 Anemia in chronic kidney disease: Secondary | ICD-10-CM | POA: Diagnosis not present

## 2013-01-06 ENCOUNTER — Inpatient Hospital Stay: Admission: RE | Admit: 2013-01-06 | Payer: Medicare Other | Source: Ambulatory Visit

## 2013-01-06 ENCOUNTER — Ambulatory Visit: Payer: Medicare Other

## 2013-01-07 DIAGNOSIS — Z23 Encounter for immunization: Secondary | ICD-10-CM | POA: Diagnosis not present

## 2013-01-07 DIAGNOSIS — N186 End stage renal disease: Secondary | ICD-10-CM | POA: Diagnosis not present

## 2013-01-07 DIAGNOSIS — D631 Anemia in chronic kidney disease: Secondary | ICD-10-CM | POA: Diagnosis not present

## 2013-01-08 ENCOUNTER — Ambulatory Visit: Payer: Medicare Other

## 2013-01-08 ENCOUNTER — Ambulatory Visit
Admission: RE | Admit: 2013-01-08 | Discharge: 2013-01-08 | Disposition: A | Discharge status: Home / Self Care | Payer: Medicare Other | Source: Ambulatory Visit

## 2013-01-08 DIAGNOSIS — Z1231 Encounter for screening mammogram for malignant neoplasm of breast: Secondary | ICD-10-CM

## 2013-01-09 DIAGNOSIS — N186 End stage renal disease: Secondary | ICD-10-CM | POA: Diagnosis not present

## 2013-01-09 DIAGNOSIS — Z23 Encounter for immunization: Secondary | ICD-10-CM | POA: Diagnosis not present

## 2013-01-09 DIAGNOSIS — D631 Anemia in chronic kidney disease: Secondary | ICD-10-CM | POA: Diagnosis not present

## 2013-01-12 DIAGNOSIS — Z23 Encounter for immunization: Secondary | ICD-10-CM | POA: Diagnosis not present

## 2013-01-12 DIAGNOSIS — N186 End stage renal disease: Secondary | ICD-10-CM | POA: Diagnosis not present

## 2013-01-12 DIAGNOSIS — D631 Anemia in chronic kidney disease: Secondary | ICD-10-CM | POA: Diagnosis not present

## 2013-01-14 DIAGNOSIS — Z23 Encounter for immunization: Secondary | ICD-10-CM | POA: Diagnosis not present

## 2013-01-14 DIAGNOSIS — D631 Anemia in chronic kidney disease: Secondary | ICD-10-CM | POA: Diagnosis not present

## 2013-01-14 DIAGNOSIS — N186 End stage renal disease: Secondary | ICD-10-CM | POA: Diagnosis not present

## 2013-01-16 DIAGNOSIS — N186 End stage renal disease: Secondary | ICD-10-CM | POA: Diagnosis not present

## 2013-01-16 DIAGNOSIS — D631 Anemia in chronic kidney disease: Secondary | ICD-10-CM | POA: Diagnosis not present

## 2013-01-16 DIAGNOSIS — Z23 Encounter for immunization: Secondary | ICD-10-CM | POA: Diagnosis not present

## 2013-01-19 DIAGNOSIS — N186 End stage renal disease: Secondary | ICD-10-CM | POA: Diagnosis not present

## 2013-01-19 DIAGNOSIS — Z23 Encounter for immunization: Secondary | ICD-10-CM | POA: Diagnosis not present

## 2013-01-19 DIAGNOSIS — D631 Anemia in chronic kidney disease: Secondary | ICD-10-CM | POA: Diagnosis not present

## 2013-01-20 ENCOUNTER — Encounter: Payer: Self-pay | Admitting: Vascular Surgery

## 2013-01-20 DIAGNOSIS — N186 End stage renal disease: Secondary | ICD-10-CM | POA: Diagnosis not present

## 2013-01-21 ENCOUNTER — Ambulatory Visit: Payer: Medicare Other | Admitting: Vascular Surgery

## 2013-01-21 DIAGNOSIS — Z23 Encounter for immunization: Secondary | ICD-10-CM | POA: Diagnosis not present

## 2013-01-21 DIAGNOSIS — N186 End stage renal disease: Secondary | ICD-10-CM | POA: Diagnosis not present

## 2013-01-21 DIAGNOSIS — R609 Edema, unspecified: Secondary | ICD-10-CM | POA: Diagnosis not present

## 2013-01-28 ENCOUNTER — Encounter: Payer: Self-pay | Admitting: Podiatry

## 2013-01-28 ENCOUNTER — Ambulatory Visit (INDEPENDENT_AMBULATORY_CARE_PROVIDER_SITE_OTHER): Payer: Medicare Other | Admitting: Podiatry

## 2013-01-28 VITALS — BP 104/63 | HR 95 | Resp 20 | Ht 62.0 in | Wt 153.0 lb

## 2013-01-28 DIAGNOSIS — L02619 Cutaneous abscess of unspecified foot: Secondary | ICD-10-CM

## 2013-01-28 NOTE — Patient Instructions (Signed)
Please contact Dr. Durwin Nora asap to evaluate surgery performed in March

## 2013-01-28 NOTE — Progress Notes (Signed)
Subjective:     Patient ID: [name], female   DOB: 07/07/1960, 52 y.o.   MRN: [id number]  HPI 52 year old female presents after having had surgery in March with loss of partial foot left. Dr. Durwin Nora performed the surgery at that timely and when he saw her last postop apparently she was healed. She is not a good historian currently. She presents by herself using a walker she's unable to bear weight on her foot.   Review of Systems  All other systems reviewed and are negative.       Objective:   Physical Exam  Nursing note and vitals reviewed.  pulses diminished left and right foot neurological diminished left and right foot patient is noted to have had a forefoot amputation left has a nonhealing incision site. Apparently she has also had vascular workup done by Dr. Durwin Nora began she is not a good historian there is no proximal limp no distention is no calor around the area and it is weepy along approximate 5 cm of length 1 cm of with.    Assessment:     Nonhealing incision site left most likely due to vascular disease.    Plan:     Reviewed condition with patient I do think she needs to go directly back to Dr. Durwin Nora as he is aware of her vascular status. I did explain that most likely she is going to lose more of her foot and may require BK amputation. She is going today to be seen by Dr. Durwin Nora at his office.

## 2013-01-29 ENCOUNTER — Encounter (HOSPITAL_BASED_OUTPATIENT_CLINIC_OR_DEPARTMENT_OTHER): Payer: Medicare Other | Attending: Internal Medicine

## 2013-01-29 DIAGNOSIS — Z992 Dependence on renal dialysis: Secondary | ICD-10-CM | POA: Diagnosis not present

## 2013-01-29 DIAGNOSIS — L84 Corns and callosities: Secondary | ICD-10-CM | POA: Insufficient documentation

## 2013-01-29 DIAGNOSIS — E119 Type 2 diabetes mellitus without complications: Secondary | ICD-10-CM | POA: Diagnosis not present

## 2013-01-29 DIAGNOSIS — N189 Chronic kidney disease, unspecified: Secondary | ICD-10-CM | POA: Diagnosis not present

## 2013-01-29 DIAGNOSIS — T8789 Other complications of amputation stump: Secondary | ICD-10-CM | POA: Diagnosis not present

## 2013-01-29 DIAGNOSIS — Y835 Amputation of limb(s) as the cause of abnormal reaction of the patient, or of later complication, without mention of misadventure at the time of the procedure: Secondary | ICD-10-CM | POA: Insufficient documentation

## 2013-01-29 LAB — GLUCOSE, CAPILLARY: Glucose-Capillary: 209 mg/dL — ABNORMAL HIGH (ref 70–99)

## 2013-01-30 NOTE — Progress Notes (Signed)
[facility]  NAME:  [name], [name] NO.:  [id number]  MEDICAL RECORD NO.:  [id number]      DATE OF BIRTH:  08-27-1960  PHYSICIAN:  Maxwell Caul, M.D.      VISIT DATE:                                  OFFICE VISIT   [name] returns to clinic today after almost a 3 month hiatus.  She was a 52 year old woman, who had undergone attempts at revascularization for critical left popliteal artery stenosis.  She ultimately underwent transmetatarsal amputation by Dr. Edilia Bo on July 15, 2012.  She is a type 2 diabetic, chronic renal failure on dialysis.  She unfortunately suffered a dehiscence of the transmetatarsal amputation site.  When we saw this initially in this clinic in May 29th, the area was in need of Debridement.  Unfortunately, she did not tolerate debridement.  We continued dressing this with Santyl, and then by July, she basically did not follow up with [place].  She returns today.  Fortunately, the wound does not look much differnet from whne this was last seen.  She has, according to her, been using Santyl on this.   On examination, temperature is 98.7, pulse 106 respirations 18, blood pressure 121/76.  Her CBG is 109.  The areas on the transmetatarsal site is in the same areas when we saw her here few months ago.  Currently, the measurements measure 1 x 4 x 0.4.  She underwent a debridement of surface slough and bioburden to the wound and also removal of circumferential callus.  She tolerated this marginally due to discomfort.  At this point, we are going to continue to dress this with a combination of Santyl Hydrogel.  This was wrapped with Kerlix and that we will attempt this see her weekly.  I think if she would allow more aggressive debridements of this area, this has a good chance of closing.          ______________________________ Maxwell Caul, M.D.     MGR/MEDQ  D:  01/29/2013  T:  01/30/2013  Job:  [id number]

## 2013-02-11 DIAGNOSIS — E1129 Type 2 diabetes mellitus with other diabetic kidney complication: Secondary | ICD-10-CM | POA: Diagnosis not present

## 2013-02-12 DIAGNOSIS — N189 Chronic kidney disease, unspecified: Secondary | ICD-10-CM | POA: Diagnosis not present

## 2013-02-12 DIAGNOSIS — Z992 Dependence on renal dialysis: Secondary | ICD-10-CM | POA: Diagnosis not present

## 2013-02-12 DIAGNOSIS — T8789 Other complications of amputation stump: Secondary | ICD-10-CM | POA: Diagnosis not present

## 2013-02-12 DIAGNOSIS — E119 Type 2 diabetes mellitus without complications: Secondary | ICD-10-CM | POA: Diagnosis not present

## 2013-02-12 LAB — GLUCOSE, CAPILLARY: Glucose-Capillary: 237 mg/dL — ABNORMAL HIGH (ref 70–99)

## 2013-02-20 DIAGNOSIS — N186 End stage renal disease: Secondary | ICD-10-CM | POA: Diagnosis not present

## 2013-02-23 DIAGNOSIS — N186 End stage renal disease: Secondary | ICD-10-CM | POA: Diagnosis not present

## 2013-02-23 DIAGNOSIS — D509 Iron deficiency anemia, unspecified: Secondary | ICD-10-CM | POA: Diagnosis not present

## 2013-02-23 DIAGNOSIS — N2581 Secondary hyperparathyroidism of renal origin: Secondary | ICD-10-CM | POA: Diagnosis not present

## 2013-02-25 ENCOUNTER — Ambulatory Visit: Payer: Medicare Other | Admitting: Vascular Surgery

## 2013-02-25 NOTE — Progress Notes (Signed)
Patient ID: [name], female   DOB: 01-31-1961, 52 y.o.   MRN: [id number]  [facility]  Allergies  Allergen Reactions  . Doxycycline Other (See Comments)    Unknown reaction  . Peroxyl [Hydrogen Peroxide] Hives and Rash    Chief Complaint  Patient presents with  . Discharge Note    HPI She is being discharged to home;with home health for pt/ot/nursing services. She will need a rollater and shower chair in order for her maintain her level of independence with all her adl's. She will need her prescriptions to be written for her.   Past Medical History  Diagnosis Date  . ESRD (end stage renal disease)   . Hypertension   . CVA (cerebral infarction)     right parietal 05/19/2000  . Vaginal bleeding   . Hyperparathyroidism   . Pneumonia   . Stroke     2002,no residual  . Peripheral vascular disease   . GERD (gastroesophageal reflux disease)   . Anemia   . DM (diabetes mellitus) type II controlled peripheral vascular disorder    Past Surgical History  Procedure Laterality Date  . Parathyroidectomy with autotransplant  12/07/2010  . Knee surgery      right, x2  . Brachial artery graft      x2 for dialysis  . Av fistula placement      upper rt thigh  . Dental extractions Bilateral   . Dilation and curettage of uterus      hx: of 1986  . 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 Leg surgery Right   . Foot surgery Left     5 TOES AMPUTATED    Filed Vitals:   08/19/12 1533  BP: 130/74  Pulse: 70  Height: 5\' 2"  (1.575 m)  Weight: 150 lb (68.04 kg)    Medications: Patient's Medications  New Prescriptions   No medications on file  Previous Medications   ACETAMINOPHEN (TYLENOL PO)    Take 1-2 tablets by mouth See admin instructions. Uses at dialysis Mon, Wed, Fridays each week for muscle spasms   AMLODIPINE (NORVASC) 10 MG  TABLET    Take 10 mg by mouth daily.     ASPIRIN 81 MG TABLET    Take 81 mg by mouth daily.     CALCIUM CARBONATE (TUMS - DOSED IN MG ELEMENTAL CALCIUM) 500 MG CHEWABLE TABLET    Chew 2 tablets by mouth 3 (three) times daily with meals.    DEXTROSE 5 % SOLN 50 ML WITH CEFTAZIDIME 2 G SOLR 2 G    Inject 2 g into the vein every Monday, Wednesday, and Friday at 8 PM.   DIAZEPAM (VALIUM) 2 MG TABLET    Take one tablet every 6 hours as needed   ESOMEPRAZOLE (NEXIUM) 40 MG CAPSULE    Take 40 mg by mouth daily as needed. For heartburn   FEEDING SUPPLEMENT (ENSURE) PUDG    Take 1 Container by mouth 3 (three) times daily between meals.   GLIPIZIDE (GLUCOTROL) 10 MG TABLET    Take 10 mg by mouth 2 (two) times daily before a meal.    HYDROCODONE-ACETAMINOPHEN (NORCO/VICODIN) 5-325 MG PER TABLET    Take 1 tablet every 6 hours as needed for moderate pain   LIDOCAINE EX    Apply 1 application topically See admin instructions. Use 1 hour prior to dialysis as needed for pain  on Mon, Wed and Fri.   MULTIVITAMIN (RENA-VIT) TABS TABLET    Take 1 tablet by mouth daily.   OXYCODONE (ROXICODONE) 5 MG IMMEDIATE RELEASE TABLET    Take 1 tablet (5 mg total) by mouth every 4 (four) hours as needed for pain.   SODIUM CHLORIDE 0.9 % SOLN 150 ML WITH VANCOMYCIN 1000 MG SOLR 750 MG    Inject 750 mg into the vein every Monday, Wednesday, and Friday with hemodialysis.  Modified Medications   No medications on file  Discontinued Medications   No medications on file    Recent Labs  07/17/12 0640 07/21/12 0645 07/23/12 0705  NA 133* 129* 134*  K 3.9 4.4 3.9  CL 93* 89* 93*  CO2 27 24 24   GLUCOSE 199* 169* 135*  BUN 19 38* 38*  CREATININE 6.38* 8.44* 8.70*  CALCIUM 11.4* 10.0 9.4  PHOS  --  3.3 4.0   Liver Function Tests:  Recent Labs  07/21/12 0645 07/23/12 0705  ALBUMIN 2.8* 2.9*      Review of Systems:  Review of Systems  Constitutional:  Negative for fever and chills.  HENT: Negative for congestion  and sore throat.   Eyes: Negative for blurred vision.  Respiratory: Negative for cough, sputum production and shortness of breath.   Cardiovascular: Negative for chest pain, palpitations, orthopnea, leg swelling and PND.  Gastrointestinal: Negative for nausea, vomiting, abdominal pain, diarrhea and constipation. .  Musculoskeletal: Negative for falls.  Skin: Negative for rash.   Psychiatric/Behavioral: Negative for depression and memory loss. The patient does not have insomnia.     Physical Exam  Nursing note and vitals reviewed. Constitutional: She is oriented to person, place, and time. She appears well-developed and well-nourished. No distress.  AA female  Head: Normocephalic and atraumatic.  Right Ear: External ear normal.  Left Ear: External ear normal.  Nose: Nose normal.  Mouth/Throat: Oropharynx is clear and moist. No oropharyngeal exudate.  Eyes: Conjunctivae and EOM are normal. Pupils are equal, round, and reactive to light. Right eye exhibits no discharge. Left eye exhibits no discharge. No scleral icterus.  Neck: Normal range of motion. Neck supple. No JVD present. No tracheal deviation present.  Cardiovascular: Regular rhythm and normal heart sounds.   Right femoral HD access with normal bruit and thrill present  Pulmonary/Chest: Effort normal and breath sounds normal. No respiratory distress. She has no rales.  Abdominal: Soft. Bowel sounds are normal. She exhibits no distension and no mass. There is no tenderness.  Musculoskeletal: She exhibits no tenderness.  Left transmetatarsal amputation present--currently dressed and in boot  Neurological: She is alert and oriented to person, place, and time. She displays normal reflexes. No cranial nerve deficit. She exhibits normal muscle tone. Coordination normal.  Skin: Skin is warm and dry. She is not diaphoretic. No erythema.  Psychiatric: She has a normal mood and affect. Her behavior is normal. Judgment and thought content  normal.   ASSESSMENT/PLAN  Will discharge her to home with home health for pt/ot/nursing; will need a rollater and shower chair; prescriptions written.  Time spent with patient 40 minutes.

## 2013-02-26 ENCOUNTER — Encounter (HOSPITAL_BASED_OUTPATIENT_CLINIC_OR_DEPARTMENT_OTHER): Payer: Medicare Other | Attending: Internal Medicine

## 2013-02-26 DIAGNOSIS — L84 Corns and callosities: Secondary | ICD-10-CM | POA: Insufficient documentation

## 2013-02-26 DIAGNOSIS — L97509 Non-pressure chronic ulcer of other part of unspecified foot with unspecified severity: Secondary | ICD-10-CM | POA: Insufficient documentation

## 2013-02-26 DIAGNOSIS — E1169 Type 2 diabetes mellitus with other specified complication: Secondary | ICD-10-CM | POA: Insufficient documentation

## 2013-03-03 ENCOUNTER — Ambulatory Visit: Payer: Medicare Other | Admitting: *Deleted

## 2013-03-03 ENCOUNTER — Telehealth: Payer: Self-pay | Admitting: *Deleted

## 2013-03-03 VITALS — BP 180/74 | HR 98 | Resp 12

## 2013-03-03 DIAGNOSIS — E139 Other specified diabetes mellitus without complications: Secondary | ICD-10-CM

## 2013-03-03 NOTE — Progress Notes (Signed)
  Subjective:    Patient ID: [name], female    DOB: 07-27-1960, 52 y.o.   MRN: [id number]  HPI Comments: DIABETIC MEASUREMENT  Diabetes      Review of Systems     Objective:   Physical Exam        Assessment & Plan:

## 2013-03-03 NOTE — Telephone Encounter (Signed)
Cindy from [facility] states that pt requested rx for diabetic shoes.  I called Cindy and explained Medicare required a confirmation of diabetic problems from pt's primary doctor - Camille Bal, MD.  I informed pt that we would be contacting Dr Eliott Nine for confirmation of necessity for diabetic shoes.  Pt states understanding.

## 2013-03-12 DIAGNOSIS — L84 Corns and callosities: Secondary | ICD-10-CM | POA: Diagnosis not present

## 2013-03-12 DIAGNOSIS — L97509 Non-pressure chronic ulcer of other part of unspecified foot with unspecified severity: Secondary | ICD-10-CM | POA: Diagnosis not present

## 2013-03-12 DIAGNOSIS — E1169 Type 2 diabetes mellitus with other specified complication: Secondary | ICD-10-CM | POA: Diagnosis not present

## 2013-03-13 ENCOUNTER — Telehealth: Payer: Self-pay | Admitting: *Deleted

## 2013-03-13 NOTE — Telephone Encounter (Signed)
Pt asked if diabetic shoes were in.

## 2013-03-22 DIAGNOSIS — N186 End stage renal disease: Secondary | ICD-10-CM | POA: Diagnosis not present

## 2013-03-23 DIAGNOSIS — D509 Iron deficiency anemia, unspecified: Secondary | ICD-10-CM | POA: Diagnosis not present

## 2013-03-23 DIAGNOSIS — N186 End stage renal disease: Secondary | ICD-10-CM | POA: Diagnosis not present

## 2013-03-26 ENCOUNTER — Encounter (HOSPITAL_BASED_OUTPATIENT_CLINIC_OR_DEPARTMENT_OTHER): Payer: Medicare Other | Attending: Internal Medicine

## 2013-03-26 DIAGNOSIS — Y835 Amputation of limb(s) as the cause of abnormal reaction of the patient, or of later complication, without mention of misadventure at the time of the procedure: Secondary | ICD-10-CM | POA: Insufficient documentation

## 2013-03-26 DIAGNOSIS — E1169 Type 2 diabetes mellitus with other specified complication: Secondary | ICD-10-CM | POA: Insufficient documentation

## 2013-03-26 DIAGNOSIS — T8189XA Other complications of procedures, not elsewhere classified, initial encounter: Secondary | ICD-10-CM | POA: Insufficient documentation

## 2013-03-26 DIAGNOSIS — L97509 Non-pressure chronic ulcer of other part of unspecified foot with unspecified severity: Secondary | ICD-10-CM | POA: Diagnosis not present

## 2013-03-26 DIAGNOSIS — L84 Corns and callosities: Secondary | ICD-10-CM | POA: Diagnosis not present

## 2013-03-26 LAB — GLUCOSE, CAPILLARY: Glucose-Capillary: 220 mg/dL — ABNORMAL HIGH (ref 70–99)

## 2013-04-02 ENCOUNTER — Encounter: Payer: Self-pay | Admitting: Podiatry

## 2013-04-02 ENCOUNTER — Ambulatory Visit (INDEPENDENT_AMBULATORY_CARE_PROVIDER_SITE_OTHER): Payer: Medicare Other | Admitting: Podiatry

## 2013-04-02 ENCOUNTER — Ambulatory Visit: Payer: Medicare Other | Admitting: Podiatry

## 2013-04-02 VITALS — BP 172/81 | HR 98 | Resp 16

## 2013-04-02 DIAGNOSIS — M79609 Pain in unspecified limb: Secondary | ICD-10-CM

## 2013-04-02 DIAGNOSIS — B351 Tinea unguium: Secondary | ICD-10-CM

## 2013-04-02 NOTE — Patient Instructions (Signed)
Diabetes and Foot Care Diabetes may cause you to have problems because of poor blood supply (circulation) to your feet and legs. This may cause the skin on your feet to become thinner, break easier, and heal more slowly. Your skin may become dry, and the skin may peel and crack. You may also have nerve damage in your legs and feet causing decreased feeling in them. You may not notice minor injuries to your feet that could lead to infections or more serious problems. Taking care of your feet is one of the most important things you can do for yourself.  HOME CARE INSTRUCTIONS  Wear shoes at all times, even in the house. Do not go barefoot. Bare feet are easily injured.  Check your feet daily for blisters, cuts, and redness. If you cannot see the bottom of your feet, use a mirror or ask someone for help.  Wash your feet with warm water (do not use hot water) and mild soap. Then pat your feet and the areas between your toes until they are completely dry. Do not soak your feet as this can dry your skin.  Apply a moisturizing lotion or petroleum jelly (that does not contain alcohol and is unscented) to the skin on your feet and to dry, brittle toenails. Do not apply lotion between your toes.  Trim your toenails straight across. Do not dig under them or around the cuticle. File the edges of your nails with an emery board or nail file.  Do not cut corns or calluses or try to remove them with medicine.  Wear clean socks or stockings every day. Make sure they are not too tight. Do not wear knee-high stockings since they may decrease blood flow to your legs.  Wear shoes that fit properly and have enough cushioning. To break in new shoes, wear them for just a few hours a day. This prevents you from injuring your feet. Always look in your shoes before you put them on to be sure there are no objects inside.  Do not cross your legs. This may decrease the blood flow to your feet.  If you find a minor scrape,  cut, or break in the skin on your feet, keep it and the skin around it clean and dry. These areas may be cleansed with mild soap and water. Do not cleanse the area with peroxide, alcohol, or iodine.  When you remove an adhesive bandage, be sure not to damage the skin around it.  If you have a wound, look at it several times a day to make sure it is healing.  Do not use heating pads or hot water bottles. They may burn your skin. If you have lost feeling in your feet or legs, you may not know it is happening until it is too late.  Make sure your health care provider performs a complete foot exam at least annually or more often if you have foot problems. Report any cuts, sores, or bruises to your health care provider immediately. SEEK MEDICAL CARE IF:   You have an injury that is not healing.  You have cuts or breaks in the skin.  You have an ingrown nail.  You notice redness on your legs or feet.  You feel burning or tingling in your legs or feet.  You have pain or cramps in your legs and feet.  Your legs or feet are numb.  Your feet always feel cold. SEEK IMMEDIATE MEDICAL CARE IF:   There is increasing redness,   swelling, or pain in or around a wound.  There is a red line that goes up your leg.  Pus is coming from a wound.  You develop a fever or as directed by your health care provider.  You notice a bad smell coming from an ulcer or wound. Document Released: 04/06/2000 Document Revised: 12/10/2012 Document Reviewed: 09/16/2012 Exit[employer] Patient Information 2014 [employer].  

## 2013-04-02 NOTE — Progress Notes (Signed)
Pt presents for toenail trim right foot only

## 2013-04-05 NOTE — Progress Notes (Signed)
Subjective:     Patient ID: [name], female   DOB: [birth year], 52 y.o.   MRN: [id number]  HPI patient presents with the elongated painful nailbeds 1-5 both feet that she cannot take care of herself. Has been there for a long time and are very thick and she does have diabetes and cannot control   Review of Systems     Objective:   Physical Exam Neurovascular status unchanged with nail disease and pain of both feet    Assessment:     Chronic mycotic nail infection with at risk diabetic both feet that are painful    Plan:     Debridement of painful nailbeds 1-5 both feet

## 2013-04-08 NOTE — Telephone Encounter (Signed)
Shoes have not been order due to doctor not signing off on required paperwork for insurance coverage. Informed pt at appt

## 2013-04-22 DIAGNOSIS — N186 End stage renal disease: Secondary | ICD-10-CM | POA: Diagnosis not present

## 2013-04-24 DIAGNOSIS — D509 Iron deficiency anemia, unspecified: Secondary | ICD-10-CM | POA: Diagnosis not present

## 2013-04-24 DIAGNOSIS — D631 Anemia in chronic kidney disease: Secondary | ICD-10-CM | POA: Diagnosis not present

## 2013-04-24 DIAGNOSIS — N186 End stage renal disease: Secondary | ICD-10-CM | POA: Diagnosis not present

## 2013-05-20 DIAGNOSIS — E1129 Type 2 diabetes mellitus with other diabetic kidney complication: Secondary | ICD-10-CM | POA: Diagnosis not present

## 2013-05-23 DIAGNOSIS — N186 End stage renal disease: Secondary | ICD-10-CM | POA: Diagnosis not present

## 2013-05-25 DIAGNOSIS — N186 End stage renal disease: Secondary | ICD-10-CM | POA: Diagnosis not present

## 2013-05-25 DIAGNOSIS — D509 Iron deficiency anemia, unspecified: Secondary | ICD-10-CM | POA: Diagnosis not present

## 2013-05-25 DIAGNOSIS — D631 Anemia in chronic kidney disease: Secondary | ICD-10-CM | POA: Diagnosis not present

## 2013-06-04 ENCOUNTER — Ambulatory Visit: Payer: Medicare Other | Admitting: Podiatry

## 2013-06-04 ENCOUNTER — Encounter (HOSPITAL_BASED_OUTPATIENT_CLINIC_OR_DEPARTMENT_OTHER): Payer: Medicare Other | Attending: Internal Medicine

## 2013-06-04 DIAGNOSIS — T8789 Other complications of amputation stump: Secondary | ICD-10-CM | POA: Diagnosis not present

## 2013-06-04 DIAGNOSIS — Y835 Amputation of limb(s) as the cause of abnormal reaction of the patient, or of later complication, without mention of misadventure at the time of the procedure: Secondary | ICD-10-CM | POA: Insufficient documentation

## 2013-06-04 DIAGNOSIS — L97509 Non-pressure chronic ulcer of other part of unspecified foot with unspecified severity: Secondary | ICD-10-CM | POA: Insufficient documentation

## 2013-06-04 DIAGNOSIS — L84 Corns and callosities: Secondary | ICD-10-CM | POA: Diagnosis not present

## 2013-06-04 DIAGNOSIS — E1169 Type 2 diabetes mellitus with other specified complication: Secondary | ICD-10-CM | POA: Insufficient documentation

## 2013-06-04 LAB — GLUCOSE, CAPILLARY: GLUCOSE-CAPILLARY: 232 mg/dL — AB (ref 70–99)

## 2013-06-18 ENCOUNTER — Telehealth: Payer: Self-pay | Admitting: *Deleted

## 2013-06-18 NOTE — Telephone Encounter (Signed)
Pt requests status of diabetic shoes.

## 2013-06-19 NOTE — Telephone Encounter (Signed)
I cannot located this patient in [employer]. Has the paperwork been initiated??

## 2013-06-20 DIAGNOSIS — N186 End stage renal disease: Secondary | ICD-10-CM | POA: Diagnosis not present

## 2013-06-22 DIAGNOSIS — N186 End stage renal disease: Secondary | ICD-10-CM | POA: Diagnosis not present

## 2013-06-25 ENCOUNTER — Encounter (HOSPITAL_BASED_OUTPATIENT_CLINIC_OR_DEPARTMENT_OTHER): Payer: Medicare Other | Attending: Internal Medicine

## 2013-06-25 DIAGNOSIS — S98919A Complete traumatic amputation of unspecified foot, level unspecified, initial encounter: Secondary | ICD-10-CM | POA: Insufficient documentation

## 2013-06-25 DIAGNOSIS — E1169 Type 2 diabetes mellitus with other specified complication: Secondary | ICD-10-CM | POA: Insufficient documentation

## 2013-06-25 DIAGNOSIS — L97509 Non-pressure chronic ulcer of other part of unspecified foot with unspecified severity: Secondary | ICD-10-CM | POA: Insufficient documentation

## 2013-07-02 ENCOUNTER — Telehealth: Payer: Self-pay | Admitting: *Deleted

## 2013-07-02 DIAGNOSIS — E1169 Type 2 diabetes mellitus with other specified complication: Secondary | ICD-10-CM | POA: Diagnosis not present

## 2013-07-02 DIAGNOSIS — L97509 Non-pressure chronic ulcer of other part of unspecified foot with unspecified severity: Secondary | ICD-10-CM | POA: Diagnosis not present

## 2013-07-02 DIAGNOSIS — S98919A Complete traumatic amputation of unspecified foot, level unspecified, initial encounter: Secondary | ICD-10-CM | POA: Diagnosis not present

## 2013-07-02 NOTE — Telephone Encounter (Signed)
Pt request status of her diabetic shoes.

## 2013-07-14 ENCOUNTER — Emergency Department (HOSPITAL_COMMUNITY): Payer: Medicare Other

## 2013-07-14 ENCOUNTER — Encounter (HOSPITAL_COMMUNITY): Payer: Self-pay | Admitting: Emergency Medicine

## 2013-07-14 ENCOUNTER — Emergency Department (HOSPITAL_COMMUNITY)
Admission: EM | Admit: 2013-07-14 | Discharge: 2013-07-14 | Disposition: A | Discharge status: Home / Self Care | Payer: Medicare Other | Attending: Emergency Medicine | Admitting: Emergency Medicine

## 2013-07-14 DIAGNOSIS — I798 Other disorders of arteries, arterioles and capillaries in diseases classified elsewhere: Secondary | ICD-10-CM | POA: Diagnosis not present

## 2013-07-14 DIAGNOSIS — Z862 Personal history of diseases of the blood and blood-forming organs and certain disorders involving the immune mechanism: Secondary | ICD-10-CM | POA: Insufficient documentation

## 2013-07-14 DIAGNOSIS — Z8673 Personal history of transient ischemic attack (TIA), and cerebral infarction without residual deficits: Secondary | ICD-10-CM | POA: Diagnosis not present

## 2013-07-14 DIAGNOSIS — N186 End stage renal disease: Secondary | ICD-10-CM | POA: Insufficient documentation

## 2013-07-14 DIAGNOSIS — M25579 Pain in unspecified ankle and joints of unspecified foot: Secondary | ICD-10-CM | POA: Insufficient documentation

## 2013-07-14 DIAGNOSIS — I120 Hypertensive chronic kidney disease with stage 5 chronic kidney disease or end stage renal disease: Secondary | ICD-10-CM | POA: Insufficient documentation

## 2013-07-14 DIAGNOSIS — M79609 Pain in unspecified limb: Secondary | ICD-10-CM | POA: Diagnosis not present

## 2013-07-14 DIAGNOSIS — M62838 Other muscle spasm: Secondary | ICD-10-CM | POA: Diagnosis not present

## 2013-07-14 DIAGNOSIS — Z8701 Personal history of pneumonia (recurrent): Secondary | ICD-10-CM | POA: Diagnosis not present

## 2013-07-14 DIAGNOSIS — Z79899 Other long term (current) drug therapy: Secondary | ICD-10-CM | POA: Insufficient documentation

## 2013-07-14 DIAGNOSIS — Z8742 Personal history of other diseases of the female genital tract: Secondary | ICD-10-CM | POA: Diagnosis not present

## 2013-07-14 DIAGNOSIS — G8929 Other chronic pain: Secondary | ICD-10-CM | POA: Insufficient documentation

## 2013-07-14 DIAGNOSIS — M79672 Pain in left foot: Secondary | ICD-10-CM

## 2013-07-14 DIAGNOSIS — E1159 Type 2 diabetes mellitus with other circulatory complications: Secondary | ICD-10-CM | POA: Diagnosis not present

## 2013-07-14 DIAGNOSIS — S98919A Complete traumatic amputation of unspecified foot, level unspecified, initial encounter: Secondary | ICD-10-CM | POA: Diagnosis not present

## 2013-07-14 DIAGNOSIS — K219 Gastro-esophageal reflux disease without esophagitis: Secondary | ICD-10-CM | POA: Insufficient documentation

## 2013-07-14 DIAGNOSIS — Z7902 Long term (current) use of antithrombotics/antiplatelets: Secondary | ICD-10-CM | POA: Diagnosis not present

## 2013-07-14 DIAGNOSIS — I10 Essential (primary) hypertension: Secondary | ICD-10-CM | POA: Diagnosis not present

## 2013-07-14 MED ORDER — HYDROCODONE-ACETAMINOPHEN 5-325 MG PO TABS
1.0000 | ORAL_TABLET | Freq: Once | ORAL | Status: AC
  Administered 2013-07-14: 1 via ORAL
  Filled 2013-07-14: qty 1

## 2013-07-14 MED ORDER — HYDROCODONE-ACETAMINOPHEN 5-325 MG PO TABS: 1.0000 | ORAL_TABLET | ORAL | Status: DC | PRN

## 2013-07-14 NOTE — Discharge Instructions (Signed)
Heat Therapy Heat therapy can help ease achy, tense, stiff, and tight muscles and joints. Heat should not be used on new injuries. Wait at least 48 hours after the injury before using heat therapy. Heat also should not be used for discomfort or pain that occurs right after doing an activity. If you still have pain or stiffness 3 hours after finishing the activity, then heat therapy may be used. PRECAUTIONS  High heat or prolonged exposure to heat can cause burns. Be careful when using heat therapy to avoid burning your skin. If you have any of the following conditions, do not use heat until you have discussed heat therapy with your caregiver:  Poor circulation.  Healing wounds or scarred skin in the area being treated.  Diabetes, heart disease, or high blood pressure.  Numbness of the area being treated.  Unusual swelling of the area being treated.  Active infections.  Blood clots.  Cancer.  Inability to communicate your response to pain. This can include young children and people with dementia. HOME CARE INSTRUCTIONS Moist heat pack  Soak a clean towel in warm water, and squeeze out the extra water. The water temperature should be comfortable to the skin.  Put the warm, wet towel in a plastic bag.  Place a thin, dry towel between your skin and the bag.  Put the heat pack on the area for 5 minutes, and check your skin. Your skin may be pink, but it should not be red.  Leave the heat pack on the area for a total of 15 to 30 minutes.  Repeat this every 2 to 4 hours while awake. Do not use heat while you are sleeping. Warm water bath  Fill a tub with warm water. The water temperature should be comfortable to the skin.  Place the affected body part in the tub.  Soak the area for 20 to 40 minutes.  Repeat as needed. Hot water bottle  Fill the water bottle half full with hot water.  Press out the extra air. Close the cap tightly.  Place a dry towel between your skin and  the bottle.  Put the bottle on the area for 5 minutes, and check your skin. Your skin may be pink, but it should not be red.  Leave the bottle on the area for a total of 15 to 30 minutes.  Repeat this every 2 to 4 hours while awake. Electric heating pad  Place a dry towel between your skin and the heating pad.  Set the heating pad on low heat.  Put the heating pad on the area for 10 minutes, and check your skin. Your skin may be pink, but it should not be red.  Leave the heating pad on the area for a total of 20 to 40 minutes.  Repeat this every 2 to 4 hours while awake.  Do not lie on the heating pad.  Do not fall asleep while using the heating pad.  Do not use the heating pad near water. Contact with water can result in an electrical shock. SEEK MEDICAL CARE IF:  You have blisters, redness, swelling, or numbness.  You have any new problems.  Your problems are getting worse.  You have any questions or concerns. If you develop any problems, stop using heat therapy until you see your caregiver. MAKE SURE YOU:  Understand these instructions.  Will watch your condition.  Will get help right away if you are not doing well or get worse. Document Released: 07/02/2011  Document Reviewed: 07/02/2011 °ExitCare® Patient Information ©2014 [employer]. ° °

## 2013-07-14 NOTE — ED Provider Notes (Signed)
CSN: [id number]     Arrival date & time 07/14/13  1235 History   First MD Initiated Contact with Patient 07/14/13 1250     Chief Complaint  Patient presents with  . Foot Pain     (Consider location/radiation/quality/duration/timing/severity/associated sxs/prior Treatment) Patient is a 53 y.o. female presenting with lower extremity pain. The history is provided by the patient.  Foot Pain This is a chronic problem. Pertinent negatives include no chills, fever or numbness. Associated symptoms comments: The patient complains of pain in her left foot similar to pain she has experienced in the past, usually controlled by Ultram. She denies injury, redness, warmth. She has undergone partial amputation of the foot with slow healing surgical incisional wound, but reports that it has been improving over time and is no worse. No drainage. .    Past Medical History  Diagnosis Date  . ESRD (end stage renal disease)   . Hypertension   . CVA (cerebral infarction)     right parietal 05/19/2000  . Vaginal bleeding   . Hyperparathyroidism   . Pneumonia   . Stroke     2002,no residual  . Peripheral vascular disease   . GERD (gastroesophageal reflux disease)   . Anemia   . DM (diabetes mellitus) type II controlled peripheral vascular disorder   . Slip/trip w/o falling due to stepping on object, subs July  2014   Past Surgical History  Procedure Laterality Date  . Parathyroidectomy with autotransplant  12/07/2010  . Knee surgery      right, x2  . Brachial artery graft      x2 for dialysis  . Av fistula placement      upper rt thigh  . Dental extractions Bilateral   . Dilation and curettage of uterus      hx: of 1986  . 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 Leg surgery Right   . Foot surgery Left     5 TOES AMPUTATED   Family History  Problem  Relation Age of Onset  . Hypertension Father   . Kidney disease Mother    History  Substance Use Topics  . Smoking status: Never Smoker   . Smokeless tobacco: Never Used  . Alcohol Use: Yes     Comment: occasional   OB History   Grav Para Term Preterm Abortions TAB SAB Ect Mult Living                 Review of Systems  Constitutional: Negative for fever and chills.  Musculoskeletal:       See HPI.  Skin: Negative.  Negative for color change.  Neurological: Negative.  Negative for numbness.      Allergies  Doxycycline and Peroxyl  Home Medications   Current Outpatient Rx  Name  Route  Sig  Dispense  Refill  . Acetaminophen (TYLENOL PO)   Oral   Take 1-2 tablets by mouth See admin instructions. Uses at dialysis Mon, Wed, Fridays each week for muscle spasms         . amLODipine (NORVASC) 10 MG tablet   Oral   Take 5 mg by mouth at bedtime. Take 1/2 tablet         . calcium carbonate (TUMS - DOSED IN MG ELEMENTAL CALCIUM) 500 MG chewable tablet   Oral   Chew 2 tablets by mouth 3 (three) times daily  with meals.          . Clopidogrel Bisulfate (PLAVIX PO)   Oral   Take by mouth daily.         . diazepam (VALIUM) 2 MG tablet   Oral   Take 2 mg by mouth every 6 (six) hours as needed for muscle spasms.          Marland Kitchen esomeprazole (NEXIUM) 40 MG capsule   Oral   Take 40 mg by mouth daily as needed (acid reflux).          Marland Kitchen glipiZIDE (GLUCOTROL) 10 MG tablet   Oral   Take 5 mg by mouth daily.          Marland Kitchen ibuprofen (ADVIL,MOTRIN) 200 MG tablet   Oral   Take 200 mg by mouth 2 (two) times daily as needed for mild pain.         . multivitamin (RENA-VIT) TABS tablet   Oral   Take 1 tablet by mouth daily.         Marland Kitchen SANTYL ointment   Topical   Apply 1 application topically 2 (two) times daily as needed (sore on foot).          . traMADol (ULTRAM) 50 MG tablet   Oral   Take 50 mg by mouth daily.          . Gauze Pads & Dressings (TELFA  NON-ADHERENT DESSING) 2"X3" PADS   Does not apply   1 Units by Does not apply route daily.   100 each   1   . LIDOCAINE EX   Apply externally   Apply 1 application topically See admin instructions. Use 1 hour prior to dialysis as needed for pain on Mon, Wed and Fri.         . Wound Dressings (VASELINE PETROLATUM GAUZE) PADS      Apply with wound dressings   50 each   0    BP 156/79  Pulse 94  Temp(Src) 97.6 F (36.4 C) (Oral)  Resp 16  Ht 5\' 2"  (1.575 m)  Wt 163 lb (73.936 kg)  BMI 29.81 kg/m2  SpO2 99% Physical Exam  Constitutional: She is oriented to person, place, and time. She appears well-developed and well-nourished.  Neck: Normal range of motion.  Pulmonary/Chest: Effort normal.  Musculoskeletal:  Left forefoot surgically absent. No swelling or redness of the remaining foot, ankle or lower left. DP pulses palpable. There is no palpable warmth or temperature unequal to right. Incisional scar is nontender. There is a very small opening at the lateral end without specific tenderness, active drainage or bleeding. No subjective change in appearance.   Neurological: She is alert and oriented to person, place, and time.  Skin: Skin is warm and dry.    ED Course  Procedures (including critical care time) Labs Review Labs Reviewed - No data to display Imaging Review Dg Foot Complete Left  07/14/2013   CLINICAL DATA:  Left foot pain  EXAM: LEFT FOOT - COMPLETE 3+ VIEW  COMPARISON:  04/21/2012  FINDINGS: Three views of the left foot submitted. The patient is status post amputation of anterior foot in mid metatarsal region. Again noted atherosclerotic vascular calcifications. No acute fracture or subluxation. No periosteal reaction or bony erosion.  IMPRESSION: Status post amputation of anterior foot in mid nasal tarsal region. No periosteal reaction or bony erosion. No acute fracture or subluxation. Again noted atherosclerotic vascular calcifications.   Electronically Signed    By: Julien Girt  Pop M.D.   On: 07/14/2013 13:59     EKG Interpretation None      MDM   Final diagnoses:  None    1. Chronic left foot pain  Patient ran out of her pain medications and is likely the source of increased pain, given no fever, redness, swelling of the foot or leg. X-ray negative. She can be discharged home to follow up with her PCP as scheduled.     Dewaine Oats, PA-C 07/14/13 1439

## 2013-07-14 NOTE — ED Notes (Signed)
Pt arrives via EMS from home with c/o increased left lower extremity foot pain and spasms for the last 4 days. Pt states she thinks she has been standing on it too much and putting a lot of pressure causing pain. Pt reports she has taken some prescribed valium which has helped. Is out of her tramadol which she normally takes as well for the pain. Pt has partial left foot amputation, unable to palpate DP pulse, wound to distal left foot with dried serous fluid.

## 2013-07-15 NOTE — ED Provider Notes (Signed)
Medical screening examination/treatment/procedure(s) were conducted as a shared visit with non-physician practitioner(s) and myself.  I personally evaluated the patient during the encounter.   EKG Interpretation None     Left foot:  Obvious forefoot amputation.  No evidence of cellulitis. Neurovascular intact. Rx pain medication  Nat Christen, MD 07/15/13 1218

## 2013-07-16 DIAGNOSIS — T82898A Other specified complication of vascular prosthetic devices, implants and grafts, initial encounter: Secondary | ICD-10-CM | POA: Diagnosis not present

## 2013-07-16 DIAGNOSIS — I871 Compression of vein: Secondary | ICD-10-CM | POA: Diagnosis not present

## 2013-07-16 DIAGNOSIS — N186 End stage renal disease: Secondary | ICD-10-CM | POA: Diagnosis not present

## 2013-07-21 DIAGNOSIS — N186 End stage renal disease: Secondary | ICD-10-CM | POA: Diagnosis not present

## 2013-07-22 DIAGNOSIS — E1129 Type 2 diabetes mellitus with other diabetic kidney complication: Secondary | ICD-10-CM | POA: Diagnosis not present

## 2013-07-22 DIAGNOSIS — N186 End stage renal disease: Secondary | ICD-10-CM | POA: Diagnosis not present

## 2013-07-29 ENCOUNTER — Emergency Department (HOSPITAL_COMMUNITY)
Admission: EM | Admit: 2013-07-29 | Discharge: 2013-07-29 | Disposition: A | Discharge status: Home / Self Care | Payer: Medicare Other | Attending: Emergency Medicine | Admitting: Emergency Medicine

## 2013-07-29 ENCOUNTER — Encounter (HOSPITAL_COMMUNITY): Payer: Self-pay | Admitting: Emergency Medicine

## 2013-07-29 ENCOUNTER — Emergency Department (HOSPITAL_COMMUNITY): Payer: Medicare Other

## 2013-07-29 DIAGNOSIS — Z9181 History of falling: Secondary | ICD-10-CM | POA: Diagnosis not present

## 2013-07-29 DIAGNOSIS — N898 Other specified noninflammatory disorders of vagina: Secondary | ICD-10-CM | POA: Insufficient documentation

## 2013-07-29 DIAGNOSIS — D649 Anemia, unspecified: Secondary | ICD-10-CM | POA: Diagnosis not present

## 2013-07-29 DIAGNOSIS — Y849 Medical procedure, unspecified as the cause of abnormal reaction of the patient, or of later complication, without mention of misadventure at the time of the procedure: Secondary | ICD-10-CM | POA: Insufficient documentation

## 2013-07-29 DIAGNOSIS — Z881 Allergy status to other antibiotic agents status: Secondary | ICD-10-CM | POA: Diagnosis not present

## 2013-07-29 DIAGNOSIS — I798 Other disorders of arteries, arterioles and capillaries in diseases classified elsewhere: Secondary | ICD-10-CM | POA: Diagnosis not present

## 2013-07-29 DIAGNOSIS — E1159 Type 2 diabetes mellitus with other circulatory complications: Secondary | ICD-10-CM | POA: Insufficient documentation

## 2013-07-29 DIAGNOSIS — I120 Hypertensive chronic kidney disease with stage 5 chronic kidney disease or end stage renal disease: Secondary | ICD-10-CM | POA: Insufficient documentation

## 2013-07-29 DIAGNOSIS — T8131XA Disruption of external operation (surgical) wound, not elsewhere classified, initial encounter: Secondary | ICD-10-CM | POA: Diagnosis not present

## 2013-07-29 DIAGNOSIS — Z79899 Other long term (current) drug therapy: Secondary | ICD-10-CM | POA: Diagnosis not present

## 2013-07-29 DIAGNOSIS — Z7902 Long term (current) use of antithrombotics/antiplatelets: Secondary | ICD-10-CM | POA: Insufficient documentation

## 2013-07-29 DIAGNOSIS — Z8701 Personal history of pneumonia (recurrent): Secondary | ICD-10-CM | POA: Insufficient documentation

## 2013-07-29 DIAGNOSIS — Z992 Dependence on renal dialysis: Secondary | ICD-10-CM | POA: Diagnosis not present

## 2013-07-29 DIAGNOSIS — K219 Gastro-esophageal reflux disease without esophagitis: Secondary | ICD-10-CM | POA: Insufficient documentation

## 2013-07-29 DIAGNOSIS — T8789 Other complications of amputation stump: Secondary | ICD-10-CM | POA: Diagnosis not present

## 2013-07-29 DIAGNOSIS — E213 Hyperparathyroidism, unspecified: Secondary | ICD-10-CM | POA: Diagnosis not present

## 2013-07-29 DIAGNOSIS — N186 End stage renal disease: Secondary | ICD-10-CM | POA: Diagnosis not present

## 2013-07-29 DIAGNOSIS — Z8673 Personal history of transient ischemic attack (TIA), and cerebral infarction without residual deficits: Secondary | ICD-10-CM | POA: Insufficient documentation

## 2013-07-29 DIAGNOSIS — T8130XA Disruption of wound, unspecified, initial encounter: Secondary | ICD-10-CM

## 2013-07-29 DIAGNOSIS — M79672 Pain in left foot: Secondary | ICD-10-CM

## 2013-07-29 DIAGNOSIS — S8990XA Unspecified injury of unspecified lower leg, initial encounter: Secondary | ICD-10-CM | POA: Diagnosis not present

## 2013-07-29 DIAGNOSIS — S99929A Unspecified injury of unspecified foot, initial encounter: Secondary | ICD-10-CM | POA: Diagnosis not present

## 2013-07-29 DIAGNOSIS — I10 Essential (primary) hypertension: Secondary | ICD-10-CM | POA: Diagnosis not present

## 2013-07-29 DIAGNOSIS — M79609 Pain in unspecified limb: Secondary | ICD-10-CM | POA: Diagnosis not present

## 2013-07-29 LAB — CBG MONITORING, ED: Glucose-Capillary: 224 mg/dL — ABNORMAL HIGH (ref 70–99)

## 2013-07-29 MED ORDER — HYDROCODONE-ACETAMINOPHEN 5-325 MG PO TABS
1.0000 | ORAL_TABLET | Freq: Once | ORAL | Status: AC
Start: 2013-07-29 — End: 2013-07-29
  Administered 2013-07-29: 1 via ORAL
  Filled 2013-07-29: qty 1

## 2013-07-29 MED ORDER — TRAMADOL HCL 50 MG PO TABS: 50.0000 mg | ORAL_TABLET | Freq: Four times a day (QID) | ORAL | Status: DC | PRN

## 2013-07-29 NOTE — ED Provider Notes (Signed)
CSN: [id number]     Arrival date & time 07/29/13  1513 History  This chart was scribed for non-physician practitioner, Margarita Mail, PA-C , working with Leota Jacobsen, MD by Ladene Artist, ED Scribe. This patient was seen in room TR09C/TR09C and the patient's care was started at 4:27 PM.      Chief Complaint  Patient presents with  . Foot Pain  . Spasms    The history is provided by the patient. No language interpreter was used.   HPI Comments: [name] is a 53 y.o. female who presents to the Emergency Department complaining of constant L foot pain onset 3 days ago. Pt states that she was stretching her legs when she hit her foot on a chair leg. Pt's original surgery was March 2014 at [facility] hospital. Pt is currently attending wound care but the physician is unknown. Pt has an AV graft in her R thigh. Pt denies fever, chills and nausea.  Past Medical History  Diagnosis Date  . ESRD (end stage renal disease)   . Hypertension   . CVA (cerebral infarction)     right parietal 05/19/2000  . Vaginal bleeding   . Hyperparathyroidism   . Pneumonia   . Stroke     2002,no residual  . Peripheral vascular disease   . GERD (gastroesophageal reflux disease)   . Anemia   . DM (diabetes mellitus) type II controlled peripheral vascular disorder   . Slip/trip w/o falling due to stepping on object, subs July  2014   Past Surgical History  Procedure Laterality Date  . Parathyroidectomy with autotransplant  12/07/2010  . Knee surgery      right, x2  . Brachial artery graft      x2 for dialysis  . Av fistula placement      upper rt thigh  . Dental extractions Bilateral   . Dilation and curettage of uterus      hx: of 1986  . 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 Leg surgery Right   . Foot surgery Left     5 TOES AMPUTATED   Family History   Problem Relation Age of Onset  . Hypertension Father   . Kidney disease Mother    History  Substance Use Topics  . Smoking status: Never Smoker   . Smokeless tobacco: Never Used  . Alcohol Use: Yes     Comment: occasional   OB History   Grav Para Term Preterm Abortions TAB SAB Ect Mult Living                 Review of Systems  Constitutional: Negative for fever and chills.  Gastrointestinal: Negative for nausea.  Skin: Positive for wound.  All other systems reviewed and are negative.   Allergies  Doxycycline and Peroxyl  Home Medications   Current Outpatient Rx  Name  Route  Sig  Dispense  Refill  . Acetaminophen (TYLENOL PO)   Oral   Take 1-2 tablets by mouth See admin instructions. Uses at dialysis Mon, Wed, Fridays each week for muscle spasms         . amLODipine (NORVASC) 10 MG tablet   Oral   Take 5 mg by mouth at bedtime. Take 1/2 tablet         . calcium carbonate (TUMS - DOSED IN MG ELEMENTAL CALCIUM) 500 MG chewable tablet  Oral   Chew 2 tablets by mouth 3 (three) times daily with meals.          . Clopidogrel Bisulfate (PLAVIX PO)   Oral   Take by mouth daily.         . diazepam (VALIUM) 2 MG tablet   Oral   Take 2 mg by mouth every 6 (six) hours as needed for muscle spasms.          Marland Kitchen esomeprazole (NEXIUM) 40 MG capsule   Oral   Take 40 mg by mouth daily as needed (acid reflux).          . Gauze Pads & Dressings (TELFA NON-ADHERENT DESSING) 2"X3" PADS   Does not apply   1 Units by Does not apply route daily.   100 each   1   . glipiZIDE (GLUCOTROL) 10 MG tablet   Oral   Take 5 mg by mouth daily.          Marland Kitchen HYDROcodone-acetaminophen (NORCO/VICODIN) 5-325 MG per tablet   Oral   Take 1-2 tablets by mouth every 4 (four) hours as needed for moderate pain.   15 tablet   0   . ibuprofen (ADVIL,MOTRIN) 200 MG tablet   Oral   Take 200 mg by mouth 2 (two) times daily as needed for mild pain.         Marland Kitchen LIDOCAINE EX   Apply  externally   Apply 1 application topically See admin instructions. Use 1 hour prior to dialysis as needed for pain on Mon, Wed and Fri.         . multivitamin (RENA-VIT) TABS tablet   Oral   Take 1 tablet by mouth daily.         Marland Kitchen SANTYL ointment   Topical   Apply 1 application topically 2 (two) times daily as needed (sore on foot).          . traMADol (ULTRAM) 50 MG tablet   Oral   Take 50 mg by mouth daily.          . Wound Dressings (VASELINE PETROLATUM GAUZE) PADS      Apply with wound dressings   50 each   0    Triage Vitals: BP 119/48  Pulse 115  Temp(Src) 98.3 F (36.8 C) (Oral)  Resp 16  Wt 163 lb (73.936 kg)  SpO2 98% Physical Exam  Nursing note and vitals reviewed. Constitutional: She is oriented to person, place, and time. She appears well-developed and well-nourished. No distress.  HENT:  Head: Normocephalic and atraumatic.  Eyes: EOM are normal.  Neck: Neck supple. No tracheal deviation present.  Cardiovascular: Normal rate.   Pulmonary/Chest: Effort normal. No respiratory distress.  Musculoskeletal: Normal range of motion.  Neurological: She is alert and oriented to person, place, and time.  Skin: Skin is warm and dry.  Previous metatarsal amputation of L foot with 5 cm well healing scar 2 cm area of dehiscence on L edge with crusting Tender to palpation No purulence   Psychiatric: She has a normal mood and affect. Her behavior is normal.    ED Course  Procedures (including critical care time) DIAGNOSTIC STUDIES: Oxygen Saturation is 98% on RA, normal by my interpretation.    COORDINATION OF CARE: 4:31 PM-Discussed treatment plan with pt at bedside and pt agreed to plan.   Labs Review Labs Reviewed  CBG MONITORING, ED - Abnormal; Notable for the following:    Glucose-Capillary 224 (*)    All  other components within normal limits   Imaging Review Dg Foot Complete Left  07/29/2013   CLINICAL DATA:  Struck left foot 2 days ago,  reopening a wound at the lateral distal metatarsal region, history of diabetes, hypertension, end-stage renal disease and partial amputation  EXAM: LEFT FOOT - COMPLETE 3+ VIEW  COMPARISON:  07/14/2013  FINDINGS: Osseous demineralization.  Prior transmetatarsal left foot amputation.  Joint spaces preserved.  No acute fracture, dislocation or bone destruction.  Soft tissue swelling at the distal aspect of the foot with soft tissue gas distal to the fourth and fifth metatarsal stumps, could be the result of trauma or infection.  Extensive small vessel vascular calcification and a few scattered distal surgical clips.  IMPRESSION: Soft tissue gas distal to the fourth and fifth metatarsal stump question related to trauma or infection.  No acute osseous abnormalities post transmetatarsal amputation left foot.   Electronically Signed   By: Lavonia Dana M.D.   On: 07/29/2013 16:58     EKG Interpretation None      MDM   Final diagnoses:  Wound dehiscence  Foot pain, left    BP 145/73  Pulse 100  Temp(Src) 98.3 F (36.8 C) (Oral)  Resp 16  Wt 163 lb (73.936 kg)  SpO2 100%  LMP 07/29/2013' Patient with wound dehiscence of the lateral age of her previous metatarsal amputation. No signs of infection this time. Give patient pain medication. She followup with her lead care Center and primary care physician.I personally reviewed the images using our PACS system. I personally performed the services described in this documentation, which was scribed in my presence. The recorded information has been reviewed and is accurate.     Margarita Mail, PA-C 07/30/13 [phone]

## 2013-07-29 NOTE — ED Notes (Signed)
Pt had a wound to L foot.  Sunday pt "bumped" her L foot and the wound opened back up.  Pt also c/o L foot spasm.

## 2013-07-29 NOTE — ED Notes (Signed)
PT comfortable with discharge and follow up instructions. Prescriptions x1. 

## 2013-07-29 NOTE — Discharge Instructions (Signed)
Please follow up with your primary doctor You will need wound management.   Chronic Pain Discharge Instructions  Emergency care providers appreciate that many patients coming to [place] are in severe pain and we wish to address their pain in the safest, most responsible manner.  It is important to recognize however, that the proper treatment of chronic pain differs from that of the pain of injuries and acute illnesses.  Our goal is to provide quality, safe, personalized care and we thank you for giving [place] the opportunity to serve you. The use of narcotics and related agents for chronic pain syndromes may lead to additional physical and psychological problems.  Nearly as many people die from prescription narcotics each year as die from car crashes.  Additionally, this risk is increased if such prescriptions are obtained from a variety of sources.  Therefore, only your primary care physician or a pain management specialist is able to safely treat such syndromes with narcotic medications long-term.    Documentation revealing such prescriptions have been sought from multiple sources may prohibit [place] from providing a refill or different narcotic medication.  Your name may be checked first through the [facility].  This database is a record of controlled substance medication prescriptions that the patient has received.  This has been established by [facility] in an effort to eliminate the dangerous, and often life threatening, practice of obtaining multiple prescriptions from different medical providers.   If you have a chronic pain syndrome (i.e. chronic headaches, recurrent back or neck pain, dental pain, abdominal or pelvis pain without a specific diagnosis, or neuropathic pain such as fibromyalgia) or recurrent visits for the same condition without an acute diagnosis, you may be treated with non-narcotics and other non-addictive medicines.  Allergic reactions or  negative side effects that may be reported by a patient to such medications will not typically lead to the use of a narcotic analgesic or other controlled substance as an alternative.   Patients managing chronic pain with a personal physician should have provisions in place for breakthrough pain.  If you are in crisis, you should call your physician.  If your physician directs you to the emergency department, please have the doctor call and speak to our attending physician concerning your care.   When patients come to the Emergency Department (ED) with acute medical conditions in which the Emergency Department physician feels appropriate to prescribe narcotic or sedating pain medication, the physician will prescribe these in very limited quantities.  The amount of these medications will last only until you can see your primary care physician in his/her office.  Any patient who returns to the ED seeking refills should expect only non-narcotic pain medications.   In the event of an acute medical condition exists and the emergency physician feels it is necessary that the patient be given a narcotic or sedating medication -  a responsible adult driver should be present in the room prior to the medication being given by the nurse.   Prescriptions for narcotic or sedating medications that have been lost, stolen or expired will not be refilled in the Emergency Department.    Patients who have chronic pain may receive non-narcotic prescriptions until seen by their primary care physician.  It is every patients personal responsibility to maintain active prescriptions with his or her primary care physician or specialist.  Wound Dehiscence Wound dehiscence is when a surgical cut (incision) breaks open and does not heal properly after surgery. It  usually happens 7 10 days after surgery. This can be a serious condition. It is important to identify and treat this condition early.  CAUSES  Some common causes of  wound dehiscence include:  Stretching of the wound area. This may be caused by lifting, vomiting, violent coughing, or straining during bowel movements.  Wound infection.  Early stitch (suture) removal. RISK FACTORS Various things can increase your risk of developing wound dehiscence, including:  Obesity.  Lung disease.  Smoking.  Poor nutrition.  Contamination during surgery. SIGNS AND SYMPTOMS  Bleeding from the wound.  Pain.  Fever.  Wound starts breaking open. DIAGNOSIS  Your health care provider may diagnose wound dehiscence by monitoring the incision and noting any changes in the wound. These changes can include an increase in drainage or pain. The health care provider may also ask you if you have noticed any stretching or tearing of the wound.  Wound cultures may be taken to determine if there is an infection.  Imaging studies, such as an MRI scan or CT scan, may be done to determine if there is a collection of pus or fluid in the wound area. TREATMENT Treatment may include:  Wound care.  Surgical repair.  Antibiotic medicine to treat or prevent infection.  Medicines to reduce pain and swelling. HOME CARE INSTRUCTIONS   Only take over-the-counter or prescription medicines for pain, discomfort, or fever as directed by your health care provider. Taking pain medicine 30 minutes before changing a bandage (dressing) can help relieve pain.  Take your antibiotics as directed. Finish them even if you start to feel better.  Gently wash the area with mild soap and water 2 times a day, or as directed. Rinse off the soap. Pat the area dry with a clean towel. Do not rub the wound. This may cause bleeding.  Follow your health care provider's instructions for how often you need to change the dressing and packing inside. Wash your hands well before and after changing your dressing. Apply a dressing to the wound as directed.  Take showers. Do not take tub baths, swim,  or do anything that may soak the wound until it is healed.  Avoid exercises that make you sweat heavily.  Use anti-itch medicine as directed by your health care provider. The wound may itch when it is healing. Do not pick or scratch at the wound.  Do not lift more than 10 pounds (4.5 kg) until the wound is healed, or as directed by your health care provider.  Keep all follow-up appointments as directed. SEEK MEDICAL CARE IF:  You have excessive bleeding from your surgical wound.  Your wound does not seem to be healing properly. SEEK IMMEDIATE MEDICAL CARE IF:   You have increased swelling or redness around the wound.  You have increasing pain in the wound.  You have an increasing amount of pus coming from the wound.  Your wound breaks open farther.  You have a fever. MAKE SURE YOU:   Understand these instructions.  Will watch your condition.  Will get help right away if you are not doing well or get worse. Document Released: 06/30/2003 Document Revised: 01/28/2013 Document Reviewed: 12/15/2012 [facility] Patient Information 2014 [place].

## 2013-07-30 ENCOUNTER — Encounter (HOSPITAL_BASED_OUTPATIENT_CLINIC_OR_DEPARTMENT_OTHER): Payer: Medicare Other | Attending: Internal Medicine

## 2013-07-30 DIAGNOSIS — E1169 Type 2 diabetes mellitus with other specified complication: Secondary | ICD-10-CM | POA: Insufficient documentation

## 2013-07-30 DIAGNOSIS — L97509 Non-pressure chronic ulcer of other part of unspecified foot with unspecified severity: Secondary | ICD-10-CM | POA: Diagnosis not present

## 2013-07-30 DIAGNOSIS — T8132XA Disruption of internal operation (surgical) wound, not elsewhere classified, initial encounter: Secondary | ICD-10-CM | POA: Diagnosis not present

## 2013-07-30 DIAGNOSIS — L84 Corns and callosities: Secondary | ICD-10-CM | POA: Diagnosis not present

## 2013-07-30 DIAGNOSIS — S98919A Complete traumatic amputation of unspecified foot, level unspecified, initial encounter: Secondary | ICD-10-CM | POA: Diagnosis not present

## 2013-07-30 NOTE — ED Provider Notes (Signed)
Medical screening examination/treatment/procedure(s) were performed by non-physician practitioner and as supervising physician I was immediately available for consultation/collaboration.   EKG Interpretation None       Leota Jacobsen, MD 07/30/13 1556

## 2013-08-03 ENCOUNTER — Telehealth: Payer: Self-pay | Admitting: *Deleted

## 2013-08-03 NOTE — Telephone Encounter (Signed)
We received her foam impression but it only had an impression of her right foot.  Is it 3 pair of inserts for the right foot or is it for both feet.  I called and informed her it's for bilateral foot  We will call the patient to come in for another measurement.

## 2013-08-06 ENCOUNTER — Ambulatory Visit: Payer: Medicare Other | Admitting: Podiatry

## 2013-08-07 ENCOUNTER — Encounter (HOSPITAL_COMMUNITY): Payer: Self-pay | Admitting: Emergency Medicine

## 2013-08-07 ENCOUNTER — Emergency Department (HOSPITAL_COMMUNITY)
Admission: EM | Admit: 2013-08-07 | Discharge: 2013-08-07 | Disposition: A | Discharge status: Home / Self Care | Payer: Medicare Other | Attending: Emergency Medicine | Admitting: Emergency Medicine

## 2013-08-07 DIAGNOSIS — Z862 Personal history of diseases of the blood and blood-forming organs and certain disorders involving the immune mechanism: Secondary | ICD-10-CM | POA: Diagnosis not present

## 2013-08-07 DIAGNOSIS — K219 Gastro-esophageal reflux disease without esophagitis: Secondary | ICD-10-CM | POA: Insufficient documentation

## 2013-08-07 DIAGNOSIS — Z7902 Long term (current) use of antithrombotics/antiplatelets: Secondary | ICD-10-CM | POA: Insufficient documentation

## 2013-08-07 DIAGNOSIS — Z8701 Personal history of pneumonia (recurrent): Secondary | ICD-10-CM | POA: Diagnosis not present

## 2013-08-07 DIAGNOSIS — R000 Tachycardia, unspecified: Secondary | ICD-10-CM | POA: Diagnosis not present

## 2013-08-07 DIAGNOSIS — Z8673 Personal history of transient ischemic attack (TIA), and cerebral infarction without residual deficits: Secondary | ICD-10-CM | POA: Diagnosis not present

## 2013-08-07 DIAGNOSIS — I120 Hypertensive chronic kidney disease with stage 5 chronic kidney disease or end stage renal disease: Secondary | ICD-10-CM | POA: Diagnosis not present

## 2013-08-07 DIAGNOSIS — I798 Other disorders of arteries, arterioles and capillaries in diseases classified elsewhere: Secondary | ICD-10-CM | POA: Insufficient documentation

## 2013-08-07 DIAGNOSIS — E1159 Type 2 diabetes mellitus with other circulatory complications: Secondary | ICD-10-CM | POA: Insufficient documentation

## 2013-08-07 DIAGNOSIS — Z4800 Encounter for change or removal of nonsurgical wound dressing: Secondary | ICD-10-CM | POA: Insufficient documentation

## 2013-08-07 DIAGNOSIS — N186 End stage renal disease: Secondary | ICD-10-CM | POA: Diagnosis not present

## 2013-08-07 DIAGNOSIS — Z4801 Encounter for change or removal of surgical wound dressing: Secondary | ICD-10-CM | POA: Diagnosis not present

## 2013-08-07 DIAGNOSIS — Z5189 Encounter for other specified aftercare: Secondary | ICD-10-CM

## 2013-08-07 DIAGNOSIS — Z9889 Other specified postprocedural states: Secondary | ICD-10-CM | POA: Insufficient documentation

## 2013-08-07 DIAGNOSIS — I10 Essential (primary) hypertension: Secondary | ICD-10-CM | POA: Diagnosis not present

## 2013-08-07 DIAGNOSIS — Z79899 Other long term (current) drug therapy: Secondary | ICD-10-CM | POA: Insufficient documentation

## 2013-08-07 DIAGNOSIS — Z8742 Personal history of other diseases of the female genital tract: Secondary | ICD-10-CM | POA: Diagnosis not present

## 2013-08-07 NOTE — Discharge Instructions (Signed)
It is very important you call the wound care center on Monday to schedule and appointment and clarify how they would like you to care for your foot.   Wound Care Wound care helps prevent pain and infection.  You may need a tetanus shot if:  You cannot remember when you had your last tetanus shot.  You have never had a tetanus shot.  The injury broke your skin. If you need a tetanus shot and you choose not to have one, you may get tetanus. Sickness from tetanus can be serious. HOME CARE   Only take medicine as told by your doctor.  Clean the wound daily with mild soap and water.  Change any bandages (dressings) as told by your doctor.  Put medicated cream and a bandage on the wound as told by your doctor.  Change the bandage if it gets wet, dirty, or starts to smell.  Take showers. Do not take baths, swim, or do anything that puts your wound under water.  Rest and raise (elevate) the wound until the pain and puffiness (swelling) are better.  Keep all doctor visits as told. GET HELP RIGHT AWAY IF:   Yellowish-white fluid (pus) comes from the wound.  Medicine does not lessen your pain.  There is a red streak going away from the wound.  You have a fever. MAKE SURE YOU:   Understand these instructions.  Will watch your condition.  Will get help right away if you are not doing well or get worse. Document Released: 01/17/2008 Document Revised: 07/02/2011 Document Reviewed: 08/13/2010 [facility] Patient Information 2014 [place], [place].

## 2013-08-07 NOTE — ED Provider Notes (Signed)
Medical screening examination/treatment/procedure(s) were conducted as a shared visit with non-physician practitioner(s) and myself.  I personally evaluated the patient during the encounter.   EKG Interpretation None      Patient seen by me. Patient with amputation to her left forefoot with a small area of wound has since followed by wound clinic. Patient last seen in wound clinic on April 9 was supposed to be seen yesterday for followup did not go. Came here. Packing was placed on the ninth and it appears as if it was not removed since. We have no notes from the wound care clinic note exactly how often the packing is to be removed. No evidence of significant infection. All packing removed and new replaced. Patient to followup at least on the phone with wound care clinic to find out how often this is to be changed. The meantime are recommending that she change it once a day. Are not sure if it should be that frequent or not.  Mervin Kung, MD 08/07/13 2019

## 2013-08-07 NOTE — ED Provider Notes (Signed)
CSN: [id number]     Arrival date & time 08/07/13  1533 History   This chart was scribed for non-physician practitioner Cleatrice Burke, PA-C working with Mervin Kung, MD by Eston Mould, ED Scribe. This patient was seen in room TR07C/TR07C and the patient's care was started at 7:10 PM .  Chief Complaint  Patient presents with  . Wound Check   The history is provided by the patient. No language interpreter was used.   HPI Comments: [name] is a 53 y.o. female with hx of DM, HTN, and CVA who presents to the Emergency Department for wound check to L foot. Pt states she had gangrene to her large toe on her L foot which caused her to have her L foot amputated.She states she has had a hard time removing the packing from the L foot.  Pt states she has been having muscle spasm from her L foot. Pt states the last time she applied clean packing was 2 days ago, but then states it was the Thursday before last. She states she has soarness and swelling to L foot but states she suspects this is due to her shoes. She is currently at the wound center and states she was suppose to be seen yesterday but was not able to make it to her apt. Pt denies fever and chills. Patient is a poor historian and history is difficult to obtain.   Past Medical History  Diagnosis Date  . ESRD (end stage renal disease)   . Hypertension   . CVA (cerebral infarction)     right parietal 05/19/2000  . Vaginal bleeding   . Hyperparathyroidism   . Pneumonia   . Stroke     2002,no residual  . Peripheral vascular disease   . GERD (gastroesophageal reflux disease)   . Anemia   . DM (diabetes mellitus) type II controlled peripheral vascular disorder   . Slip/trip w/o falling due to stepping on object, subs July  2014   Past Surgical History  Procedure Laterality Date  . Parathyroidectomy with autotransplant  12/07/2010  . Knee surgery      right, x2  . Brachial artery graft      x2 for dialysis  . Av  fistula placement      upper rt thigh  . Dental extractions Bilateral   . Dilation and curettage of uterus      hx: of 1986  . 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 Leg surgery Right   . Foot surgery Left     5 TOES AMPUTATED   Family History  Problem Relation Age of Onset  . Hypertension Father   . Kidney disease Mother    History  Substance Use Topics  . Smoking status: Never Smoker   . Smokeless tobacco: Never Used  . Alcohol Use: Yes     Comment: occasional   OB History   Grav Para Term Preterm Abortions TAB SAB Ect Mult Living                 Review of Systems  Constitutional: Negative for fever and chills.  Skin: Positive for wound. Negative for color change.  All other systems reviewed and are negative.  Allergies  Doxycycline and Peroxyl  Home Medications   Prior to Admission medications   Medication Sig Start Date End Date Taking? Authorizing Provider  Acetaminophen (TYLENOL  PO) Take 1-2 tablets by mouth See admin instructions. Uses at dialysis Mon, Wed, Fridays each week for muscle spasms    Historical Provider, MD  amLODipine (NORVASC) 10 MG tablet Take 10 mg by mouth at bedtime.     Historical Provider, MD  calcium carbonate (TUMS - DOSED IN MG ELEMENTAL CALCIUM) 500 MG chewable tablet Chew 2 tablets by mouth 3 (three) times daily with meals.     Historical Provider, MD  Clopidogrel Bisulfate (PLAVIX PO) Take 75 mg by mouth daily.     Historical Provider, MD  diazepam (VALIUM) 2 MG tablet Take 2 mg by mouth every 6 (six) hours as needed for muscle spasms.  07/28/12   Tiffany L Reed, DO  esomeprazole (NEXIUM) 40 MG capsule Take 40 mg by mouth daily as needed (acid reflux).     Historical Provider, MD  Gauze Pads & Dressings (TELFA NON-ADHERENT DESSING) 2"X3" PADS 1 Units by Does not apply route daily. 12/09/12   Freeman Caldron  Baker, PA-C  glipiZIDE (GLUCOTROL) 10 MG tablet Take 5 mg by mouth daily.     Historical Provider, MD  ibuprofen (ADVIL,MOTRIN) 200 MG tablet Take 200 mg by mouth 2 (two) times daily as needed for mild pain.    Historical Provider, MD  LIDOCAINE EX Apply 1 application topically See admin instructions. Use 1 hour prior to dialysis as needed for pain on Mon, Wed and Fri.    Historical Provider, MD  Multiple Vitamin (MULTIVITAMIN WITH MINERALS) TABS tablet Take 1 tablet by mouth daily.    Historical Provider, MD  multivitamin (RENA-VIT) TABS tablet Take 1 tablet by mouth daily.    Historical Provider, MD  SANTYL ointment Apply 1 application topically 2 (two) times daily as needed (sore on foot).  11/17/12   Historical Provider, MD  traMADol (ULTRAM) 50 MG tablet Take 1 tablet (50 mg total) by mouth every 6 (six) hours as needed. 07/29/13   Margarita Mail, PA-C  Wound Dressings (VASELINE PETROLATUM GAUZE) PADS Apply with wound dressings 12/09/12   Liam Graham, PA-C   BP 143/61  Pulse 106  Temp(Src) 98.7 F (37.1 C) (Oral)  Resp 20  Ht 5\' 2"  (1.575 m)  Wt 163 lb (73.936 kg)  BMI 29.81 kg/m2  SpO2 94%  LMP 07/29/2013  Physical Exam  Nursing note and vitals reviewed. Constitutional: She is oriented to person, place, and time. She appears well-developed and well-nourished. No distress.  HENT:  Head: Normocephalic and atraumatic.  Right Ear: External ear normal.  Left Ear: External ear normal.  Nose: Nose normal.  Mouth/Throat: Oropharynx is clear and moist.  Eyes: Conjunctivae and EOM are normal.  Neck: Normal range of motion. Neck supple. No tracheal deviation present.  Cardiovascular: Regular rhythm and normal heart sounds.  Tachycardia present.   Pulmonary/Chest: Effort normal and breath sounds normal. No stridor. No respiratory distress. She has no wheezes. She has no rales.  Abdominal: Soft. She exhibits no distension.  Musculoskeletal: Normal range of motion.  Amputation of distal  portion of left foot. No drainage coming from incision site. Packing in place. No erythema or warmth.   Neurological: She is alert and oriented to person, place, and time. She has normal strength.  Skin: Skin is warm and dry. She is not diaphoretic. No erythema.  Psychiatric: She has a normal mood and affect. Her behavior is normal.   ED Course  Procedures  DIAGNOSTIC STUDIES: Oxygen Saturation is 98% on RA, normal by my interpretation.    COORDINATION  OF CARE: 7:19 PM-Discussed treatment plan which includes consult with attending for further tx. Pt agreed to plan.   Labs Review Labs Reviewed  CBG MONITORING, ED    Imaging Review No results found.   EKG Interpretation None     MDM   Final diagnoses:  Encounter for wound care    Patient presents to ED for help with wound care. It appears packing was placed in wound on 4/9. It has not been removed since then. Patient is a poor historian and there are no notes in the computer to suggest what the plan the [facility] has developed for this patient. The packing was removed and new packing was placed in her wound. It was redressed and patient was encouraged to call the [facility] on Monday morning to discuss her care. At this time there does not appear to be active infection. Return instructions given. Vital signs stable for discharge. Dr. Rogene Houston evaluated patient and agrees with plan. Patient / Family / Caregiver informed of clinical course, understand medical decision-making process, and agree with plan.   I personally performed the services described in this documentation, which was scribed in my presence. The recorded information has been reviewed and is accurate.    Elwyn Lade, PA-C 08/08/13 2039

## 2013-08-07 NOTE — ED Notes (Signed)
Pt had partial amputation of L foot and she reports she is having trouble doing the wound care at home. She is supposed to be changing the packing but can not figure out how. She staets she is also having muscle spasms in the foot

## 2013-08-07 NOTE — ED Notes (Signed)
CBG result was 174. Used New Patient Override. Unable to scan patient badge.

## 2013-08-10 ENCOUNTER — Ambulatory Visit (INDEPENDENT_AMBULATORY_CARE_PROVIDER_SITE_OTHER): Payer: Medicare Other | Admitting: Podiatry

## 2013-08-10 ENCOUNTER — Encounter: Payer: Self-pay | Admitting: Podiatry

## 2013-08-10 DIAGNOSIS — B351 Tinea unguium: Secondary | ICD-10-CM

## 2013-08-10 DIAGNOSIS — M79609 Pain in unspecified limb: Secondary | ICD-10-CM | POA: Diagnosis not present

## 2013-08-10 DIAGNOSIS — L97509 Non-pressure chronic ulcer of other part of unspecified foot with unspecified severity: Secondary | ICD-10-CM | POA: Diagnosis not present

## 2013-08-10 NOTE — Progress Notes (Signed)
Subjective:     Patient ID: [name], female   DOB: August 21, 1960, 53 y.o.   MRN: [id number]  HPI patient states she bumped her left amputated stump foot several days ago and scab fell off and she wanted to get it checked but she knows she is due to go wound care on Thursday and they will take care of it. Also complains of her nails on her right foot   Review of Systems     Objective:   Physical Exam Neurovascular status unchanged with well-healed transmetatarsal amputation left with a gapping area on the lateral side measuring approximately 2 cm in length 1 cm in width with no odor and a dry granulated bed noted    Assessment:     Trauma to the incision site left that is being treated by the wound care center and nail disease 1-5 right    Plan:     Debrided painful thick nails 1-5 right applied sterile dressing to the left with Iodosorb and patient will go to wound care center on Thursday and be seen back

## 2013-08-11 DIAGNOSIS — S91309A Unspecified open wound, unspecified foot, initial encounter: Secondary | ICD-10-CM | POA: Diagnosis not present

## 2013-08-11 DIAGNOSIS — E119 Type 2 diabetes mellitus without complications: Secondary | ICD-10-CM | POA: Diagnosis not present

## 2013-08-11 DIAGNOSIS — S98139A Complete traumatic amputation of one unspecified lesser toe, initial encounter: Secondary | ICD-10-CM | POA: Diagnosis not present

## 2013-08-11 LAB — CBG MONITORING, ED
Glucose-Capillary: 168 mg/dL — ABNORMAL HIGH (ref 70–99)
Glucose-Capillary: 174 mg/dL — ABNORMAL HIGH (ref 70–99)

## 2013-08-12 DIAGNOSIS — E1129 Type 2 diabetes mellitus with other diabetic kidney complication: Secondary | ICD-10-CM | POA: Diagnosis not present

## 2013-08-13 DIAGNOSIS — E1169 Type 2 diabetes mellitus with other specified complication: Secondary | ICD-10-CM | POA: Diagnosis not present

## 2013-08-13 DIAGNOSIS — S98919A Complete traumatic amputation of unspecified foot, level unspecified, initial encounter: Secondary | ICD-10-CM | POA: Diagnosis not present

## 2013-08-13 DIAGNOSIS — L84 Corns and callosities: Secondary | ICD-10-CM | POA: Diagnosis not present

## 2013-08-13 DIAGNOSIS — L97509 Non-pressure chronic ulcer of other part of unspecified foot with unspecified severity: Secondary | ICD-10-CM | POA: Diagnosis not present

## 2013-08-17 ENCOUNTER — Ambulatory Visit: Payer: Medicare Other | Admitting: Podiatry

## 2013-08-18 DIAGNOSIS — S98139A Complete traumatic amputation of one unspecified lesser toe, initial encounter: Secondary | ICD-10-CM | POA: Diagnosis not present

## 2013-08-18 DIAGNOSIS — E119 Type 2 diabetes mellitus without complications: Secondary | ICD-10-CM | POA: Diagnosis not present

## 2013-08-18 DIAGNOSIS — S91309A Unspecified open wound, unspecified foot, initial encounter: Secondary | ICD-10-CM | POA: Diagnosis not present

## 2013-08-20 DIAGNOSIS — S98919A Complete traumatic amputation of unspecified foot, level unspecified, initial encounter: Secondary | ICD-10-CM | POA: Diagnosis not present

## 2013-08-20 DIAGNOSIS — E1169 Type 2 diabetes mellitus with other specified complication: Secondary | ICD-10-CM | POA: Diagnosis not present

## 2013-08-20 DIAGNOSIS — L84 Corns and callosities: Secondary | ICD-10-CM | POA: Diagnosis not present

## 2013-08-20 DIAGNOSIS — N186 End stage renal disease: Secondary | ICD-10-CM | POA: Diagnosis not present

## 2013-08-20 DIAGNOSIS — L97509 Non-pressure chronic ulcer of other part of unspecified foot with unspecified severity: Secondary | ICD-10-CM | POA: Diagnosis not present

## 2013-08-21 DIAGNOSIS — N186 End stage renal disease: Secondary | ICD-10-CM | POA: Diagnosis not present

## 2013-08-21 DIAGNOSIS — E1129 Type 2 diabetes mellitus with other diabetic kidney complication: Secondary | ICD-10-CM | POA: Diagnosis not present

## 2013-08-21 DIAGNOSIS — D631 Anemia in chronic kidney disease: Secondary | ICD-10-CM | POA: Diagnosis not present

## 2013-08-22 DIAGNOSIS — E119 Type 2 diabetes mellitus without complications: Secondary | ICD-10-CM | POA: Diagnosis not present

## 2013-08-22 DIAGNOSIS — S98139A Complete traumatic amputation of one unspecified lesser toe, initial encounter: Secondary | ICD-10-CM | POA: Diagnosis not present

## 2013-08-22 DIAGNOSIS — S91309A Unspecified open wound, unspecified foot, initial encounter: Secondary | ICD-10-CM | POA: Diagnosis not present

## 2013-08-25 DIAGNOSIS — S98139A Complete traumatic amputation of one unspecified lesser toe, initial encounter: Secondary | ICD-10-CM | POA: Diagnosis not present

## 2013-08-25 DIAGNOSIS — E119 Type 2 diabetes mellitus without complications: Secondary | ICD-10-CM | POA: Diagnosis not present

## 2013-08-25 DIAGNOSIS — S91309A Unspecified open wound, unspecified foot, initial encounter: Secondary | ICD-10-CM | POA: Diagnosis not present

## 2013-08-27 ENCOUNTER — Encounter (HOSPITAL_BASED_OUTPATIENT_CLINIC_OR_DEPARTMENT_OTHER): Payer: Medicare Other | Attending: Internal Medicine

## 2013-08-27 DIAGNOSIS — S98919A Complete traumatic amputation of unspecified foot, level unspecified, initial encounter: Secondary | ICD-10-CM | POA: Diagnosis not present

## 2013-08-27 DIAGNOSIS — L97509 Non-pressure chronic ulcer of other part of unspecified foot with unspecified severity: Secondary | ICD-10-CM | POA: Diagnosis not present

## 2013-08-27 DIAGNOSIS — E1169 Type 2 diabetes mellitus with other specified complication: Secondary | ICD-10-CM | POA: Diagnosis not present

## 2013-08-28 ENCOUNTER — Telehealth: Payer: Self-pay | Admitting: *Deleted

## 2013-08-28 NOTE — Telephone Encounter (Signed)
Returning you call.  Give me a call back.  I left her a message to please call us back to schedule an appointment to be scanned for Diabetic insoles.  Previous mold was only done of the right foot.  I apologized for any inconvenience this may cause her.  I informed her she will not be charged for this visit.

## 2013-08-29 DIAGNOSIS — E119 Type 2 diabetes mellitus without complications: Secondary | ICD-10-CM | POA: Diagnosis not present

## 2013-08-29 DIAGNOSIS — S98139A Complete traumatic amputation of one unspecified lesser toe, initial encounter: Secondary | ICD-10-CM | POA: Diagnosis not present

## 2013-08-29 DIAGNOSIS — S91309A Unspecified open wound, unspecified foot, initial encounter: Secondary | ICD-10-CM | POA: Diagnosis not present

## 2013-08-31 ENCOUNTER — Telehealth: Payer: Self-pay | Admitting: *Deleted

## 2013-08-31 NOTE — Telephone Encounter (Signed)
I got this paoper from you all that says [employer] for Diabetic Shoes.  I was told they would be the ones to fit me for shoes.  Call me back.  I called the patient.  She said she was returning my call about coming in for an appointment to do a scan of her feet.  She said she was told couldn't get Diabetic shoes through [place].  She was given a prescription for [employer] by Dr. Paulla Dolly.  She said she has a hole in her foot that she is trying to heal before she goes.  I told her okay, thanks for calling and letting me know.

## 2013-09-01 ENCOUNTER — Other Ambulatory Visit: Payer: Self-pay | Admitting: *Deleted

## 2013-09-01 DIAGNOSIS — S98919A Complete traumatic amputation of unspecified foot, level unspecified, initial encounter: Secondary | ICD-10-CM | POA: Diagnosis not present

## 2013-09-01 DIAGNOSIS — E119 Type 2 diabetes mellitus without complications: Secondary | ICD-10-CM | POA: Diagnosis not present

## 2013-09-01 DIAGNOSIS — F17200 Nicotine dependence, unspecified, uncomplicated: Secondary | ICD-10-CM | POA: Diagnosis not present

## 2013-09-01 DIAGNOSIS — I739 Peripheral vascular disease, unspecified: Secondary | ICD-10-CM

## 2013-09-01 DIAGNOSIS — R011 Cardiac murmur, unspecified: Secondary | ICD-10-CM | POA: Diagnosis not present

## 2013-09-01 DIAGNOSIS — N186 End stage renal disease: Secondary | ICD-10-CM | POA: Diagnosis not present

## 2013-09-01 DIAGNOSIS — Z992 Dependence on renal dialysis: Secondary | ICD-10-CM | POA: Diagnosis not present

## 2013-09-01 DIAGNOSIS — E213 Hyperparathyroidism, unspecified: Secondary | ICD-10-CM | POA: Diagnosis not present

## 2013-09-01 DIAGNOSIS — L97909 Non-pressure chronic ulcer of unspecified part of unspecified lower leg with unspecified severity: Secondary | ICD-10-CM

## 2013-09-01 DIAGNOSIS — I120 Hypertensive chronic kidney disease with stage 5 chronic kidney disease or end stage renal disease: Secondary | ICD-10-CM | POA: Diagnosis not present

## 2013-09-01 DIAGNOSIS — Z8673 Personal history of transient ischemic attack (TIA), and cerebral infarction without residual deficits: Secondary | ICD-10-CM | POA: Diagnosis not present

## 2013-09-02 DIAGNOSIS — I120 Hypertensive chronic kidney disease with stage 5 chronic kidney disease or end stage renal disease: Secondary | ICD-10-CM | POA: Diagnosis not present

## 2013-09-02 DIAGNOSIS — N186 End stage renal disease: Secondary | ICD-10-CM | POA: Diagnosis not present

## 2013-09-02 DIAGNOSIS — F17200 Nicotine dependence, unspecified, uncomplicated: Secondary | ICD-10-CM | POA: Diagnosis not present

## 2013-09-02 DIAGNOSIS — E119 Type 2 diabetes mellitus without complications: Secondary | ICD-10-CM | POA: Diagnosis not present

## 2013-09-02 DIAGNOSIS — I739 Peripheral vascular disease, unspecified: Secondary | ICD-10-CM | POA: Diagnosis not present

## 2013-09-02 DIAGNOSIS — R011 Cardiac murmur, unspecified: Secondary | ICD-10-CM | POA: Diagnosis not present

## 2013-09-04 DIAGNOSIS — E119 Type 2 diabetes mellitus without complications: Secondary | ICD-10-CM | POA: Diagnosis not present

## 2013-09-04 DIAGNOSIS — S98139A Complete traumatic amputation of one unspecified lesser toe, initial encounter: Secondary | ICD-10-CM | POA: Diagnosis not present

## 2013-09-04 DIAGNOSIS — S91309A Unspecified open wound, unspecified foot, initial encounter: Secondary | ICD-10-CM | POA: Diagnosis not present

## 2013-09-08 ENCOUNTER — Encounter: Payer: Self-pay | Admitting: Vascular Surgery

## 2013-09-08 DIAGNOSIS — E119 Type 2 diabetes mellitus without complications: Secondary | ICD-10-CM | POA: Diagnosis not present

## 2013-09-08 DIAGNOSIS — S91309A Unspecified open wound, unspecified foot, initial encounter: Secondary | ICD-10-CM | POA: Diagnosis not present

## 2013-09-08 DIAGNOSIS — S98139A Complete traumatic amputation of one unspecified lesser toe, initial encounter: Secondary | ICD-10-CM | POA: Diagnosis not present

## 2013-09-09 ENCOUNTER — Other Ambulatory Visit (HOSPITAL_COMMUNITY): Payer: Medicare Other

## 2013-09-09 ENCOUNTER — Ambulatory Visit (HOSPITAL_COMMUNITY)
Admission: RE | Admit: 2013-09-09 | Discharge: 2013-09-09 | Disposition: A | Discharge status: Home / Self Care | Payer: Medicare Other | Source: Ambulatory Visit | Attending: Vascular Surgery | Admitting: Vascular Surgery

## 2013-09-09 ENCOUNTER — Ambulatory Visit (INDEPENDENT_AMBULATORY_CARE_PROVIDER_SITE_OTHER): Payer: Medicare Other | Admitting: Vascular Surgery

## 2013-09-09 ENCOUNTER — Encounter: Payer: Self-pay | Admitting: Vascular Surgery

## 2013-09-09 ENCOUNTER — Ambulatory Visit: Payer: Medicare Other | Admitting: Vascular Surgery

## 2013-09-09 VITALS — BP 148/68 | HR 110 | Ht 62.0 in | Wt 163.0 lb

## 2013-09-09 DIAGNOSIS — L97909 Non-pressure chronic ulcer of unspecified part of unspecified lower leg with unspecified severity: Secondary | ICD-10-CM

## 2013-09-09 DIAGNOSIS — I739 Peripheral vascular disease, unspecified: Secondary | ICD-10-CM | POA: Diagnosis not present

## 2013-09-09 NOTE — Progress Notes (Signed)
[facility] [place] HISTORY AND PHYSICAL   CC:  Non healing wound left foot  Ricard Dillon, MD  HPI: This is a 53 y.o. female who presents today with a non healing wound on the lateral aspect of her transmet amputation site.  She had a transmet amputation on the left by Dr. [name] on 07/15/12.  She is a poor historian, but states that she bumped her foot a couple of months ago and it caused a sore.  She states that she bumped it again and the sore worsened.  She also has a full thickness wound on the heel of her left foot.  She cannot state when she developed this, but does state it was after the sore on her amputation site. She did undergo angiogram 12/30/12 at [facility] where she had PTA and stenting of the left popliteal 90% stenosis to the less than 10% with 5.5 x 40 SUPERA self expanding stent and a 6 x 40 Zilver PTX drug eluding stent as well as successful mechanical thrombectomy of a left anterior tibial 100% to less than 10% with a aspire aspiration thrombectomy device.    She is ESRD and dialyzes M/W/F at the [facility] center.   She does have DM, which she takes glipizide.  She also has hx of CVA in 2002 at which time she had difficulty with speech and memory.  She states this has improved.   Past Medical History  Diagnosis Date  . ESRD (end stage renal disease)   . Hypertension   . CVA (cerebral infarction)     right parietal 05/19/2000  . Vaginal bleeding   . Hyperparathyroidism   . Pneumonia   . Stroke     2002,no residual  . Peripheral vascular disease   . GERD (gastroesophageal reflux disease)   . Anemia   . DM (diabetes mellitus) type II controlled peripheral vascular disorder   . Slip/trip w/o falling due to stepping on object, subs July  2014   Past Surgical History  Procedure Laterality Date  . Parathyroidectomy with autotransplant  12/07/2010  . Knee surgery      right, x2  . Brachial artery graft      x2 for dialysis  . Av fistula  placement      upper rt thigh  . Dental extractions Bilateral   . Dilation and curettage of uterus      hx: of 1986  . 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 OR;  Service: Vascular;  Laterality: Left;  . Leg surgery Right   . Foot surgery Left     5 TOES AMPUTATED    Allergies  Allergen Reactions  . Doxycycline Itching  . Peroxyl [Hydrogen Peroxide] Hives and Rash    Current Outpatient Prescriptions  Medication Sig Dispense Refill  . Acetaminophen (TYLENOL PO) Take 1-2 tablets by mouth See admin instructions. Uses at dialysis Mon, Wed, Fridays each week for muscle spasms      . amLODipine (NORVASC) 10 MG tablet Take 5 mg by mouth at bedtime.       Marland Kitchen CALCIUM-VITAMIN D PO Take 1 tablet by mouth 3 (three) times daily with meals.      . Clopidogrel Bisulfate (PLAVIX PO) Take 75 mg by mouth daily.       . diazepam (VALIUM) 2 MG tablet Take 2 mg by mouth every 6 (six) hours as needed for muscle spasms.       Marland Kitchen  esomeprazole (NEXIUM) 40 MG capsule Take 40 mg by mouth daily as needed (acid reflux).       . Gauze Pads & Dressings (TELFA NON-ADHERENT DESSING) 2"X3" PADS 1 Units by Does not apply route daily.  100 each  1  . glipiZIDE (GLUCOTROL) 10 MG tablet Take 5 mg by mouth daily.       Marland Kitchen ibuprofen (ADVIL,MOTRIN) 200 MG tablet Take 200 mg by mouth 2 (two) times daily as needed for mild pain.      Marland Kitchen LIDOCAINE EX Apply 1 application topically See admin instructions. Use 1 hour prior to dialysis as needed for pain on Mon, Wed and Fri.      . Multiple Vitamin (MULTIVITAMIN WITH MINERALS) TABS tablet Take 1 tablet by mouth daily.      . multivitamin (RENA-VIT) TABS tablet Take 1 tablet by mouth daily.       No current facility-administered medications for this visit.    Family History  Problem Relation Age of Onset  . Hypertension Father   . Kidney disease Mother     History   Social History   . Marital Status: Single    Spouse Name: N/A    Number of Children: N/A  . Years of Education: N/A   Occupational History  . Not on file.   Social History Main Topics  . Smoking status: Never Smoker   . Smokeless tobacco: Never Used  . Alcohol Use: Yes     Comment: occasional  . Drug Use: No  . Sexual Activity: No   Other Topics Concern  . Not on file   Social History Narrative  . No narrative on file     ROS: [x]  Positive   [ ]  Negative   [ ]  All sytems reviewed and are negative  Cardiovascular: []  chest pain/pressure []  palpitations []  SOB lying flat []  DOE []  pain in legs while walking [x] pain in left foot when lying flat []  hx of DVT []  hx of phlebitis []  swelling in legs []  varicose veins  Pulmonary: []  productive cough []  asthma []  wheezing  Neurologic: []  weakness in []  arms []  legs []  numbness in []  arms []  legs [] difficulty speaking or slurred speech []  temporary loss of vision in one eye []  dizziness [x]  hx CVA with aphasia and memory problems  Hematologic: []  bleeding problems []  problems with blood clotting easily  GI []  vomiting blood []  blood in stool  GU: []  burning with urination []  blood in urine [x]  ESRD on HD W/W/F  Psychiatric: []  hx of major depression  Integumentary: []  rashes [x]  ulcers  Constitutional: []  fever []  chills   PHYSICAL EXAMINATION:  Filed Vitals:   09/09/13 1557  BP: 148/68  Pulse: 110   Body mass index is 29.81 kg/(m^2).  General:  WDWN in NAD Gait: Not observed HENT: WNL, normocephalic Eyes: Pupils equal Pulmonary: normal non-labored breathing , without Rales, rhonchi,  wheezing Cardiac: RRR, without  Murmurs, rubs or gallops; without carotid bruits Abdomen: soft, NT, no masses Skin: without rashes, with ulcers  Vascular Exam/Pulses:  Right Left  Radial 2+ (normal) 2+ (normal)  Ulnar Unable to palpate Unable to palpate  Femoral  Difficult to palpate, but there is a + doppler signal    Popliteal Non palpable Non palpable  DP Non palpable  Non palpable + doppler signal  PT Non palpable  Non palpable  - doppler signal    Extremities: with ischemic changes, with Gangrene , without cellulitis; with open wounds to  lateral aspect of right  transmet amputation site with fibrinous tissue.  No bone exposure noted.  She does have a full thickness wound to her left heel. Musculoskeletal: no muscle wasting or atrophy  Neurologic: A&O X 3; Appropriate Affect ; SENSATION: normal; MOTOR FUNCTION:  moving all extremities equally. Speech is slow.   Non-Invasive Vascular Imaging:  09/09/13 Lower extremity arterial evaluation 1.  Greater than 50% stenosis distal to the stent 2.  The stent appears patent  Pt meds includes: Statin:  no Beta Blocker:  no Aspirin:  no ACEI:  no ARB:  no Other Antiplatelet/Anticoagulant:  yes-Plavix   ASSESSMENT/PLAN: 53 y.o. female with non healing wound to lateral aspect of left transmet amputation and a full thickness wound on her heel.  -Dr. [name] recommending angiogram to evaluate LLE arterial anatomy with possible intervention.  The pt wants to think about having the procedure stating that her wound has improved.  She has DM, which also puts her at high risk for limb loss with this non healing wound and concerning full thickness ulcer of her left heel.  -She is also given the option to return to [facility] for further evaluation. -the pt will contact us when she is ready to proceed.  Leontine Locket, PA-C [facility] [phone]  Clinic MD:  Pt seen and examined in conjunction with Dr. [name]  Agree with above. She has a full thickness wound on her left heel and an open ulcer on the lateral aspect of her transmetatarsal amputation. 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 I cannot palpate a femoral pulse on the left although she does have a monophasic femoral signal. She has evidence of a  stenosis below her stent in the left lower extremity. I think without revascularization she is at significant risk for limb loss. I recommended that we proceed with arteriography in order to of I would her for options for revascularization. She may potentially have iliac disease amenable to angioplasty.I have reviewed with the patient the indications for arteriography. In addition, I have reviewed the potential complications of arteriography including but not limited to: Bleeding, arterial injury, arterial thrombosis, dye action, renal insufficiency, or other unpredictable medical problems. I have explained to the patient that if we find disease amenable to angioplasty we could potentially address this at the same time. I have discussed the potential complications of angioplasty and stenting, including but not limited to: Bleeding, arterial thrombosis, arterial injury, dissection, or the need for surgical intervention. She would like to think about this before making a decision. I've encouraged her not to wait too long as I think this is clearly a limb threatening situation.  Deitra Mayo, MD, [place] [phone] 09/09/2013

## 2013-09-10 DIAGNOSIS — E1169 Type 2 diabetes mellitus with other specified complication: Secondary | ICD-10-CM | POA: Diagnosis not present

## 2013-09-10 DIAGNOSIS — S98919A Complete traumatic amputation of unspecified foot, level unspecified, initial encounter: Secondary | ICD-10-CM | POA: Diagnosis not present

## 2013-09-10 DIAGNOSIS — L97509 Non-pressure chronic ulcer of other part of unspecified foot with unspecified severity: Secondary | ICD-10-CM | POA: Diagnosis not present

## 2013-09-12 DIAGNOSIS — S91309A Unspecified open wound, unspecified foot, initial encounter: Secondary | ICD-10-CM | POA: Diagnosis not present

## 2013-09-12 DIAGNOSIS — E119 Type 2 diabetes mellitus without complications: Secondary | ICD-10-CM | POA: Diagnosis not present

## 2013-09-12 DIAGNOSIS — S98139A Complete traumatic amputation of one unspecified lesser toe, initial encounter: Secondary | ICD-10-CM | POA: Diagnosis not present

## 2013-09-15 ENCOUNTER — Emergency Department (HOSPITAL_COMMUNITY): Payer: Medicare Other

## 2013-09-15 ENCOUNTER — Ambulatory Visit (INDEPENDENT_AMBULATORY_CARE_PROVIDER_SITE_OTHER): Payer: Medicare Other | Admitting: Podiatry

## 2013-09-15 ENCOUNTER — Emergency Department (HOSPITAL_COMMUNITY)
Admission: EM | Admit: 2013-09-15 | Discharge: 2013-09-15 | Disposition: A | Discharge status: Home / Self Care | Payer: Medicare Other | Attending: Emergency Medicine | Admitting: Emergency Medicine

## 2013-09-15 ENCOUNTER — Encounter: Payer: Self-pay | Admitting: Podiatry

## 2013-09-15 VITALS — BP 167/82 | HR 102 | Resp 16

## 2013-09-15 DIAGNOSIS — Z862 Personal history of diseases of the blood and blood-forming organs and certain disorders involving the immune mechanism: Secondary | ICD-10-CM | POA: Insufficient documentation

## 2013-09-15 DIAGNOSIS — Z7902 Long term (current) use of antithrombotics/antiplatelets: Secondary | ICD-10-CM | POA: Diagnosis not present

## 2013-09-15 DIAGNOSIS — N186 End stage renal disease: Secondary | ICD-10-CM | POA: Diagnosis not present

## 2013-09-15 DIAGNOSIS — Z79899 Other long term (current) drug therapy: Secondary | ICD-10-CM | POA: Insufficient documentation

## 2013-09-15 DIAGNOSIS — R609 Edema, unspecified: Secondary | ICD-10-CM | POA: Diagnosis not present

## 2013-09-15 DIAGNOSIS — T148XXA Other injury of unspecified body region, initial encounter: Secondary | ICD-10-CM | POA: Diagnosis not present

## 2013-09-15 DIAGNOSIS — S98919A Complete traumatic amputation of unspecified foot, level unspecified, initial encounter: Secondary | ICD-10-CM | POA: Diagnosis not present

## 2013-09-15 DIAGNOSIS — E119 Type 2 diabetes mellitus without complications: Secondary | ICD-10-CM | POA: Diagnosis not present

## 2013-09-15 DIAGNOSIS — Z8679 Personal history of other diseases of the circulatory system: Secondary | ICD-10-CM | POA: Insufficient documentation

## 2013-09-15 DIAGNOSIS — Z992 Dependence on renal dialysis: Secondary | ICD-10-CM | POA: Diagnosis not present

## 2013-09-15 DIAGNOSIS — Z8673 Personal history of transient ischemic attack (TIA), and cerebral infarction without residual deficits: Secondary | ICD-10-CM | POA: Diagnosis not present

## 2013-09-15 DIAGNOSIS — Z5189 Encounter for other specified aftercare: Secondary | ICD-10-CM

## 2013-09-15 DIAGNOSIS — I120 Hypertensive chronic kidney disease with stage 5 chronic kidney disease or end stage renal disease: Secondary | ICD-10-CM | POA: Diagnosis not present

## 2013-09-15 DIAGNOSIS — L089 Local infection of the skin and subcutaneous tissue, unspecified: Secondary | ICD-10-CM | POA: Diagnosis not present

## 2013-09-15 DIAGNOSIS — Z8701 Personal history of pneumonia (recurrent): Secondary | ICD-10-CM | POA: Insufficient documentation

## 2013-09-15 DIAGNOSIS — Y838 Other surgical procedures as the cause of abnormal reaction of the patient, or of later complication, without mention of misadventure at the time of the procedure: Secondary | ICD-10-CM | POA: Insufficient documentation

## 2013-09-15 DIAGNOSIS — L97509 Non-pressure chronic ulcer of other part of unspecified foot with unspecified severity: Secondary | ICD-10-CM | POA: Diagnosis not present

## 2013-09-15 DIAGNOSIS — Z792 Long term (current) use of antibiotics: Secondary | ICD-10-CM | POA: Diagnosis not present

## 2013-09-15 DIAGNOSIS — I10 Essential (primary) hypertension: Secondary | ICD-10-CM | POA: Diagnosis not present

## 2013-09-15 DIAGNOSIS — Z4801 Encounter for change or removal of surgical wound dressing: Secondary | ICD-10-CM | POA: Insufficient documentation

## 2013-09-15 LAB — CBC
HEMATOCRIT: 25.2 % — AB (ref 36.0–46.0)
Hemoglobin: 8.2 g/dL — ABNORMAL LOW (ref 12.0–15.0)
MCH: 30.5 pg (ref 26.0–34.0)
MCHC: 32.5 g/dL (ref 30.0–36.0)
MCV: 93.7 fL (ref 78.0–100.0)
Platelets: 395 10*3/uL (ref 150–400)
RBC: 2.69 MIL/uL — ABNORMAL LOW (ref 3.87–5.11)
RDW: 15.4 % (ref 11.5–15.5)
WBC: 9.1 10*3/uL (ref 4.0–10.5)

## 2013-09-15 LAB — BASIC METABOLIC PANEL
BUN: 35 mg/dL — AB (ref 6–23)
CHLORIDE: 91 meq/L — AB (ref 96–112)
CO2: 28 mEq/L (ref 19–32)
Calcium: 9.2 mg/dL (ref 8.4–10.5)
Creatinine, Ser: 8.43 mg/dL — ABNORMAL HIGH (ref 0.50–1.10)
GFR calc Af Amer: 6 mL/min — ABNORMAL LOW (ref >90)
GFR calc non Af Amer: 5 mL/min — ABNORMAL LOW (ref >90)
GLUCOSE: 210 mg/dL — AB (ref 70–99)
Potassium: 4.2 mEq/L (ref 3.7–5.3)
Sodium: 135 mEq/L — ABNORMAL LOW (ref 137–147)

## 2013-09-15 LAB — I-STAT CG4 LACTIC ACID, ED: Lactic Acid, Venous: 1.21 mmol/L (ref 0.5–2.2)

## 2013-09-15 MED ORDER — DIPHENHYDRAMINE HCL 25 MG PO CAPS
25.0000 mg | ORAL_CAPSULE | Freq: Once | ORAL | Status: AC
  Administered 2013-09-15: 25 mg via ORAL
  Filled 2013-09-15: qty 1

## 2013-09-15 NOTE — ED Notes (Signed)
From home via [facility], wound left foot following procedure on 4/12, concerned about infection, reports foul odor, A/O, VSS, NAD

## 2013-09-15 NOTE — Discharge Instructions (Signed)
Wound Infection  A wound infection happens when a type of germ (bacteria) starts growing in the wound. In some cases, this can cause the wound to break open. If cared for properly, the infected wound will heal from the inside to the outside. Wound infections need treatment.  CAUSES  An infection is caused by bacteria growing in the wound.   SYMPTOMS    Increase in redness, swelling, or pain at the wound site.   Increase in drainage at the wound site.   Wound or bandage (dressing) starts to smell bad.   Fever.   Feeling tired or fatigued.   Pus draining from the wound.  TREATMENT   You caregiver will prescribe antibiotic medicine. The wound infection should improve within 24 to 48 hours. Any redness around the wound should stop spreading and the wound should be less painful.   HOME CARE INSTRUCTIONS    Only take over-the-counter or prescription medicines for pain, discomfort, or fever as directed by your caregiver.   Take your antibiotics as directed. Finish them even if you start to feel better.   Gently wash the area with mild soap and water 2 times a day, or as directed. Rinse off the soap. Pat the area dry with a clean towel. Do not rub the wound. This may cause bleeding.   Follow your caregiver's instructions for how often you need to change the dressing.   Apply ointment and a dressing to the wound as directed.   If the dressing sticks, moisten it with soapy water and gently remove it.   Change the bandage right away if it becomes wet, dirty, or develops a bad smell.   Take showers. Do not take tub baths, swim, or do anything that may soak the wound until it is healed.   Avoid exercises that make you sweat heavily.   Use anti-itch medicine as directed by your caregiver. The wound may itch when it is healing. Do not pick or scratch at the wound.   Follow up with your caregiver to get your wound rechecked as directed.  SEEK MEDICAL CARE IF:   You have an increase in swelling, pain, or redness  around the wound.   You have an increase in the amount of pus coming from the wound.   There is a bad smell coming from the wound.   More of the wound breaks open.   You have a fever.  MAKE SURE YOU:    Understand these instructions.   Will watch your condition.   Will get help right away if you are not doing well or get worse.  Document Released: 01/06/2003 Document Revised: 07/02/2011 Document Reviewed: 08/13/2010  [employer] Patient Information 2014 [employer]

## 2013-09-15 NOTE — Progress Notes (Signed)
   Subjective:    Patient ID: [name], female    DOB: 08-24-1960, 53 y.o.   MRN: [id number]  HPI DIABETIC SHOE MEASUREMENT.   Review of Systems     Objective:   Physical Exam: I have reviewed her past medical history medications and allergies. Her left foot is warm to the touch which has recently opened distally secondary to an injury she is status post transmetatarsal. She has most recently had a revascularization of her left leg do to a failed stent. She is currently taking a biotics and following up with wound care.  Currently the left distal aspect of the foot does have an eschar present but is draining with malodor. I see no signs of cellulitis. Her posterior medial aspect of her left heel also demonstrates an eschar which is extremely painful more than likely ischemic in nature possibly from showering emboli from revascularization. This does not appear to be clinically infected at this time. She does like to sit with the foot dependent which he pain.  Assessment: Peripheral vascular disease left lower extremity history medications and currently infected.  Plan: Discussed etiology pathology conservative versus surgical therapies we redressed the wound today she will followup with wound care tomorrow. There is nothing more that I can do for her.         Assessment:  Her neurovascular disease  Peripheral vascular diseasent & Plan:

## 2013-09-15 NOTE — ED Provider Notes (Signed)
CSN: [id number]     Arrival date & time 09/15/13  1707 History   First MD Initiated Contact with Patient 09/15/13 1953     Chief Complaint  Patient presents with  . Wound Infection     (Consider location/radiation/quality/duration/timing/severity/associated sxs/prior Treatment) Patient is a 53 y.o. female presenting with wound check.  Wound Check This is a chronic problem. The current episode started 1 to 4 weeks ago. The problem occurs constantly. The problem has been gradually worsening. Pertinent negatives include no chills or fever. The symptoms are aggravated by walking.  Patient presents today with a slowly healing wound on the lateral aspect of her transmet amputation site (performed by Dr. [name] on 07/15/12).  She also has a full-thickness wound on the heel of her left foot. Patient receiving home wound care and was seen at the foot center today.  She is concerned about a foul odor from the wound. She states she is currently on antibiotics.  Patient is diabetic. Wound covered with silvadene dressing.  Dressing dry and intact.  Past Medical History  Diagnosis Date  . ESRD (end stage renal disease)   . Hypertension   . CVA (cerebral infarction)     right parietal 05/19/2000  . Vaginal bleeding   . Hyperparathyroidism   . Pneumonia   . Stroke     2002,no residual  . Peripheral vascular disease   . GERD (gastroesophageal reflux disease)   . Anemia   . DM (diabetes mellitus) type II controlled peripheral vascular disorder   . Slip/trip w/o falling due to stepping on object, subs July  2014   Past Surgical History  Procedure Laterality Date  . Parathyroidectomy with autotransplant  12/07/2010  . Knee surgery      right, x2  . Brachial artery graft      x2 for dialysis  . Av fistula placement      upper rt thigh  . Dental extractions Bilateral   . Dilation and curettage of uterus      hx: of 1986  . 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 Leg surgery Right   . Foot surgery Left     5 TOES AMPUTATED   Family History  Problem Relation Age of Onset  . Hypertension Father   . Kidney disease Mother    History  Substance Use Topics  . Smoking status: Never Smoker   . Smokeless tobacco: Never Used  . Alcohol Use: Yes     Comment: occasional   OB History   Grav Para Term Preterm Abortions TAB SAB Ect Mult Living                 Review of Systems  Constitutional: Negative for fever and chills.  Skin: Positive for wound.  All other systems reviewed and are negative.     Allergies  Doxycycline and Peroxyl  Home Medications   Prior to Admission medications   Medication Sig Start Date End Date Taking? Authorizing Provider  Acetaminophen (TYLENOL PO) Take 1-2 tablets by mouth See admin instructions. Uses at dialysis Mon, Wed, Fridays each week for muscle spasms   Yes Historical Provider, MD  amLODipine (NORVASC) 10 MG tablet Take 5 mg by mouth at bedtime.    Yes Historical Provider, MD  amoxicillin-clavulanate (AUGMENTIN) 500-125 MG per tablet Take 2 tablets by mouth daily.  09/10/13 09/15/13 Yes Historical Provider,  MD  aspirin 81 MG tablet Take 81 mg by mouth daily.   Yes Historical Provider, MD  CALCIUM-VITAMIN D PO Take 1 tablet by mouth 3 (three) times daily with meals.   Yes Historical Provider, MD  Clopidogrel Bisulfate (PLAVIX PO) Take 75 mg by mouth daily.    Yes Historical Provider, MD  diphenhydrAMINE (BENADRYL) 25 MG tablet Take 25 mg by mouth every 6 (six) hours as needed (Muscle spasm in feet).   Yes Historical Provider, MD  esomeprazole (NEXIUM) 40 MG capsule Take 40 mg by mouth daily as needed (acid reflux).    Yes Historical Provider, MD  glipiZIDE (GLUCOTROL) 10 MG tablet Take 5 mg by mouth daily.    Yes Historical Provider, MD  HYDROcodone-acetaminophen (NORCO/VICODIN) 5-325 MG per tablet Take 1  tablet by mouth every 6 (six) hours as needed for moderate pain.  08/26/13  Yes Historical Provider, MD  Multiple Vitamin (MULTIVITAMIN WITH MINERALS) TABS tablet Take 1 tablet by mouth daily.   Yes Historical Provider, MD  multivitamin (RENA-VIT) TABS tablet Take 1 tablet by mouth daily.   Yes Historical Provider, MD  neomycin-bacitracin-polymyxin (NEOSPORIN) [phone] ointment Apply 1 application topically daily. Apply to foot   Yes Historical Provider, MD  polyethylene glycol (MIRALAX / GLYCOLAX) packet Take 17 g by mouth daily as needed for mild constipation.   Yes Historical Provider, MD  traMADol (ULTRAM) 50 MG tablet Take 50 mg by mouth every 6 (six) hours as needed for moderate pain.  08/07/13  Yes Historical Provider, MD  VITAMIN E PO Take 1 tablet by mouth daily as needed (Healing of foot).   Yes Historical Provider, MD  Gauze Pads & Dressings (TELFA NON-ADHERENT DESSING) 2"X3" PADS 1 Units by Does not apply route daily. 12/09/12   Freeman Caldron Baker, PA-C   BP 169/76  Pulse 99  Temp(Src) 98.3 F (36.8 C) (Oral)  Resp 16  SpO2 100% Physical Exam  Nursing note and vitals reviewed. Constitutional: She is oriented to person, place, and time. She appears well-developed and well-nourished.  HENT:  Head: Normocephalic.  Eyes: Pupils are equal, round, and reactive to light.  Neck: Normal range of motion.  Cardiovascular: Normal rate and regular rhythm.   Pulmonary/Chest: Effort normal and breath sounds normal.  Abdominal: Soft. Bowel sounds are normal.  Musculoskeletal: She exhibits edema and tenderness.  Lymphadenopathy:    She has no cervical adenopathy.  Neurological: She is alert and oriented to person, place, and time.  Skin: Skin is warm and dry.  Full-thickness wound on the heel of her left foot. Wound on the lateral aspect of her transmet amputation site    Psychiatric: She has a normal mood and affect. Her behavior is normal. Judgment and thought content normal.    ED Course   Procedures (including critical care time) Labs Review Labs Reviewed  CBC - Abnormal; Notable for the following:    RBC 2.69 (*)    Hemoglobin 8.2 (*)    HCT 25.2 (*)    All other components within normal limits  BASIC METABOLIC PANEL - Abnormal; Notable for the following:    Sodium 135 (*)    Chloride 91 (*)    Glucose, Bld 210 (*)    BUN 35 (*)    Creatinine, Ser 8.43 (*)    GFR calc non Af Amer 5 (*)    GFR calc Af Amer 6 (*)    All other components within normal limits  I-STAT CG4 LACTIC ACID, ED  Imaging Review No results found.   EKG Interpretation None     Lab and radiology results reviewed and shared with patient.  No leukocytosis.  No indication of osteomyelitis or tissue gas.  Patient is seen by wound care nurse at home on Tuesday and Saturday, and at the wound care clinic at [facility] on Thursdays. MDM   Final diagnoses:  None    Wound check.    Norman Herrlich, NP 09/15/13 2228

## 2013-09-15 NOTE — ED Notes (Signed)
CG-4 resulted to Ben-RN at [facility]

## 2013-09-15 NOTE — ED Provider Notes (Signed)
Patient presented to the ER with swelling and drainage from heel. Patient has a history of chronic wound in the area, followed by wound care. She is on all antibiotics and Silvadene topically. She reports that there has been some odor coming from the area I wanted to make sure there was no "gangrene"  Face to face Exam: HEENT - PERRLA Lungs - CTAB Heart - RRR, no M/R/G Abd - S/NT/ND Neuro - alert, oriented x3 Skin - a chronic-appearing ulceration over the heel with some eschar formation. No obvious purulent drainage at this time  Plan: Her normal baseline. Will x-ray to rule out osteomyelitis, reassure patient, continue current treatment plan.   Orpah Greek, MD 09/15/13 2038

## 2013-09-15 NOTE — ED Notes (Signed)
Pt states that she is currently on antibiotics from her md. Augmentin 500-125 mg tab

## 2013-09-15 NOTE — ED Provider Notes (Signed)
Medical screening examination/treatment/procedure(s) were conducted as a shared visit with non-physician practitioner(s) and myself.  I personally evaluated the patient during the encounter.  Please see separate associated note for evaluation and plan.    EKG Interpretation None       Orpah Greek, MD 09/15/13 2232

## 2013-09-19 DIAGNOSIS — E119 Type 2 diabetes mellitus without complications: Secondary | ICD-10-CM | POA: Diagnosis not present

## 2013-09-19 DIAGNOSIS — S91309A Unspecified open wound, unspecified foot, initial encounter: Secondary | ICD-10-CM | POA: Diagnosis not present

## 2013-09-19 DIAGNOSIS — S98139A Complete traumatic amputation of one unspecified lesser toe, initial encounter: Secondary | ICD-10-CM | POA: Diagnosis not present

## 2013-09-20 DIAGNOSIS — N186 End stage renal disease: Secondary | ICD-10-CM | POA: Diagnosis not present

## 2013-09-21 DIAGNOSIS — D631 Anemia in chronic kidney disease: Secondary | ICD-10-CM | POA: Diagnosis not present

## 2013-09-21 DIAGNOSIS — N186 End stage renal disease: Secondary | ICD-10-CM | POA: Diagnosis not present

## 2013-09-21 DIAGNOSIS — D509 Iron deficiency anemia, unspecified: Secondary | ICD-10-CM | POA: Diagnosis not present

## 2013-09-21 DIAGNOSIS — E1129 Type 2 diabetes mellitus with other diabetic kidney complication: Secondary | ICD-10-CM | POA: Diagnosis not present

## 2013-09-21 DIAGNOSIS — N039 Chronic nephritic syndrome with unspecified morphologic changes: Secondary | ICD-10-CM | POA: Diagnosis not present

## 2013-09-22 DIAGNOSIS — S98139A Complete traumatic amputation of one unspecified lesser toe, initial encounter: Secondary | ICD-10-CM | POA: Diagnosis not present

## 2013-09-22 DIAGNOSIS — E119 Type 2 diabetes mellitus without complications: Secondary | ICD-10-CM | POA: Diagnosis not present

## 2013-09-22 DIAGNOSIS — S91309A Unspecified open wound, unspecified foot, initial encounter: Secondary | ICD-10-CM | POA: Diagnosis not present

## 2013-09-23 ENCOUNTER — Ambulatory Visit: Payer: Medicare Other | Admitting: Podiatry

## 2013-09-24 ENCOUNTER — Encounter: Payer: Self-pay | Admitting: Vascular Surgery

## 2013-09-24 ENCOUNTER — Encounter (HOSPITAL_BASED_OUTPATIENT_CLINIC_OR_DEPARTMENT_OTHER): Payer: Medicare Other | Attending: Internal Medicine

## 2013-09-24 ENCOUNTER — Telehealth: Payer: Self-pay | Admitting: Vascular Surgery

## 2013-09-24 DIAGNOSIS — E1169 Type 2 diabetes mellitus with other specified complication: Secondary | ICD-10-CM | POA: Insufficient documentation

## 2013-09-24 DIAGNOSIS — L97509 Non-pressure chronic ulcer of other part of unspecified foot with unspecified severity: Secondary | ICD-10-CM | POA: Diagnosis not present

## 2013-09-24 NOTE — Telephone Encounter (Signed)
Faxed letter stating pt had an appt w/ out office on 09/09/13 to her transportation office at [phone]

## 2013-09-25 LAB — GLUCOSE, CAPILLARY: Glucose-Capillary: 111 mg/dL — ABNORMAL HIGH (ref 70–99)

## 2013-09-26 DIAGNOSIS — E119 Type 2 diabetes mellitus without complications: Secondary | ICD-10-CM | POA: Diagnosis not present

## 2013-09-26 DIAGNOSIS — S91309A Unspecified open wound, unspecified foot, initial encounter: Secondary | ICD-10-CM | POA: Diagnosis not present

## 2013-09-26 DIAGNOSIS — S98139A Complete traumatic amputation of one unspecified lesser toe, initial encounter: Secondary | ICD-10-CM | POA: Diagnosis not present

## 2013-09-28 DIAGNOSIS — Z992 Dependence on renal dialysis: Secondary | ICD-10-CM | POA: Diagnosis not present

## 2013-09-28 DIAGNOSIS — S98919A Complete traumatic amputation of unspecified foot, level unspecified, initial encounter: Secondary | ICD-10-CM | POA: Diagnosis not present

## 2013-09-28 DIAGNOSIS — L97409 Non-pressure chronic ulcer of unspecified heel and midfoot with unspecified severity: Secondary | ICD-10-CM | POA: Diagnosis not present

## 2013-09-28 DIAGNOSIS — F17200 Nicotine dependence, unspecified, uncomplicated: Secondary | ICD-10-CM | POA: Diagnosis present

## 2013-09-28 DIAGNOSIS — D631 Anemia in chronic kidney disease: Secondary | ICD-10-CM | POA: Diagnosis not present

## 2013-09-28 DIAGNOSIS — E119 Type 2 diabetes mellitus without complications: Secondary | ICD-10-CM | POA: Diagnosis not present

## 2013-09-28 DIAGNOSIS — Z8673 Personal history of transient ischemic attack (TIA), and cerebral infarction without residual deficits: Secondary | ICD-10-CM | POA: Diagnosis not present

## 2013-09-28 DIAGNOSIS — C9200 Acute myeloblastic leukemia, not having achieved remission: Secondary | ICD-10-CM | POA: Diagnosis present

## 2013-09-28 DIAGNOSIS — I999 Unspecified disorder of circulatory system: Secondary | ICD-10-CM | POA: Diagnosis not present

## 2013-09-28 DIAGNOSIS — N2581 Secondary hyperparathyroidism of renal origin: Secondary | ICD-10-CM | POA: Diagnosis present

## 2013-09-28 DIAGNOSIS — N186 End stage renal disease: Secondary | ICD-10-CM | POA: Diagnosis not present

## 2013-09-28 DIAGNOSIS — Z8249 Family history of ischemic heart disease and other diseases of the circulatory system: Secondary | ICD-10-CM | POA: Diagnosis not present

## 2013-09-28 DIAGNOSIS — I120 Hypertensive chronic kidney disease with stage 5 chronic kidney disease or end stage renal disease: Secondary | ICD-10-CM | POA: Diagnosis not present

## 2013-09-28 DIAGNOSIS — I739 Peripheral vascular disease, unspecified: Secondary | ICD-10-CM | POA: Diagnosis not present

## 2013-09-28 DIAGNOSIS — M79609 Pain in unspecified limb: Secondary | ICD-10-CM | POA: Diagnosis not present

## 2013-10-01 ENCOUNTER — Ambulatory Visit: Payer: Medicare Other | Admitting: Internal Medicine

## 2013-10-01 DIAGNOSIS — E1169 Type 2 diabetes mellitus with other specified complication: Secondary | ICD-10-CM | POA: Diagnosis not present

## 2013-10-01 DIAGNOSIS — L97509 Non-pressure chronic ulcer of other part of unspecified foot with unspecified severity: Secondary | ICD-10-CM | POA: Diagnosis not present

## 2013-10-02 ENCOUNTER — Telehealth: Payer: Self-pay | Admitting: *Deleted

## 2013-10-02 NOTE — Telephone Encounter (Signed)
I called pt to ask her why she missed her appointment. She said that she was unable to get here. She said that she did call to inform us that she couldn't make it. Pt was rescheduled to the 30th at 2:00.

## 2013-10-03 DIAGNOSIS — E119 Type 2 diabetes mellitus without complications: Secondary | ICD-10-CM | POA: Diagnosis not present

## 2013-10-03 DIAGNOSIS — S91309A Unspecified open wound, unspecified foot, initial encounter: Secondary | ICD-10-CM | POA: Diagnosis not present

## 2013-10-03 DIAGNOSIS — S98139A Complete traumatic amputation of one unspecified lesser toe, initial encounter: Secondary | ICD-10-CM | POA: Diagnosis not present

## 2013-10-06 DIAGNOSIS — S91309A Unspecified open wound, unspecified foot, initial encounter: Secondary | ICD-10-CM | POA: Diagnosis not present

## 2013-10-06 DIAGNOSIS — S98139A Complete traumatic amputation of one unspecified lesser toe, initial encounter: Secondary | ICD-10-CM | POA: Diagnosis not present

## 2013-10-06 DIAGNOSIS — E119 Type 2 diabetes mellitus without complications: Secondary | ICD-10-CM | POA: Diagnosis not present

## 2013-10-08 ENCOUNTER — Encounter: Payer: Self-pay | Admitting: Podiatry

## 2013-10-08 ENCOUNTER — Ambulatory Visit (INDEPENDENT_AMBULATORY_CARE_PROVIDER_SITE_OTHER): Payer: Medicare Other | Admitting: Podiatry

## 2013-10-08 ENCOUNTER — Ambulatory Visit: Payer: Medicaid Other | Admitting: Podiatry

## 2013-10-08 VITALS — BP 135/78 | HR 75 | Resp 16

## 2013-10-08 DIAGNOSIS — L97409 Non-pressure chronic ulcer of unspecified heel and midfoot with unspecified severity: Secondary | ICD-10-CM

## 2013-10-08 DIAGNOSIS — L97509 Non-pressure chronic ulcer of other part of unspecified foot with unspecified severity: Secondary | ICD-10-CM | POA: Diagnosis not present

## 2013-10-08 DIAGNOSIS — E1169 Type 2 diabetes mellitus with other specified complication: Secondary | ICD-10-CM | POA: Diagnosis not present

## 2013-10-08 NOTE — Progress Notes (Signed)
Subjective:     Patient ID: [name], female   DOB: 1960-12-10, 53 y.o.   MRN: [id number]  HPI patient presents after having amputation left forefoot with calloused tissue on the left heel that she was scared about   Review of Systems     Objective:   Physical Exam Vascular status has improved with revascularization neurological status same with keratotic lesion left heel that is not draining or weepy    Assessment:     Dry callus left heel    Plan:     Reapplied sterile dressing and if any issues should occur reappoint for US vascular doctors or family physician.

## 2013-10-08 NOTE — Progress Notes (Signed)
   Subjective:    Patient ID: [name], female    DOB: [birth year], 53 y.o.   MRN: [id number]  HPI  Pt has left foot ulcer on heel, she has been keeping dressing on it, states that it is painful but she has been offloading it and not walking on left foot. Review of Systems     Objective:   Physical Exam        Assessment & Plan:

## 2013-10-10 DIAGNOSIS — Z992 Dependence on renal dialysis: Secondary | ICD-10-CM | POA: Diagnosis not present

## 2013-10-10 DIAGNOSIS — I120 Hypertensive chronic kidney disease with stage 5 chronic kidney disease or end stage renal disease: Secondary | ICD-10-CM | POA: Diagnosis not present

## 2013-10-10 DIAGNOSIS — N186 End stage renal disease: Secondary | ICD-10-CM | POA: Diagnosis not present

## 2013-10-10 DIAGNOSIS — S98139A Complete traumatic amputation of one unspecified lesser toe, initial encounter: Secondary | ICD-10-CM | POA: Diagnosis not present

## 2013-10-10 DIAGNOSIS — E119 Type 2 diabetes mellitus without complications: Secondary | ICD-10-CM | POA: Diagnosis not present

## 2013-10-10 DIAGNOSIS — S91309A Unspecified open wound, unspecified foot, initial encounter: Secondary | ICD-10-CM | POA: Diagnosis not present

## 2013-10-13 DIAGNOSIS — S91309A Unspecified open wound, unspecified foot, initial encounter: Secondary | ICD-10-CM | POA: Diagnosis not present

## 2013-10-13 DIAGNOSIS — Z992 Dependence on renal dialysis: Secondary | ICD-10-CM | POA: Diagnosis not present

## 2013-10-13 DIAGNOSIS — E119 Type 2 diabetes mellitus without complications: Secondary | ICD-10-CM | POA: Diagnosis not present

## 2013-10-13 DIAGNOSIS — N186 End stage renal disease: Secondary | ICD-10-CM | POA: Diagnosis not present

## 2013-10-13 DIAGNOSIS — I120 Hypertensive chronic kidney disease with stage 5 chronic kidney disease or end stage renal disease: Secondary | ICD-10-CM | POA: Diagnosis not present

## 2013-10-13 DIAGNOSIS — S98139A Complete traumatic amputation of one unspecified lesser toe, initial encounter: Secondary | ICD-10-CM | POA: Diagnosis not present

## 2013-10-17 DIAGNOSIS — E119 Type 2 diabetes mellitus without complications: Secondary | ICD-10-CM | POA: Diagnosis not present

## 2013-10-17 DIAGNOSIS — S91309A Unspecified open wound, unspecified foot, initial encounter: Secondary | ICD-10-CM | POA: Diagnosis not present

## 2013-10-17 DIAGNOSIS — I120 Hypertensive chronic kidney disease with stage 5 chronic kidney disease or end stage renal disease: Secondary | ICD-10-CM | POA: Diagnosis not present

## 2013-10-17 DIAGNOSIS — Z992 Dependence on renal dialysis: Secondary | ICD-10-CM | POA: Diagnosis not present

## 2013-10-17 DIAGNOSIS — N186 End stage renal disease: Secondary | ICD-10-CM | POA: Diagnosis not present

## 2013-10-17 DIAGNOSIS — S98139A Complete traumatic amputation of one unspecified lesser toe, initial encounter: Secondary | ICD-10-CM | POA: Diagnosis not present

## 2013-10-20 ENCOUNTER — Ambulatory Visit: Payer: Medicare Other | Attending: Internal Medicine | Admitting: Internal Medicine

## 2013-10-20 ENCOUNTER — Encounter: Payer: Self-pay | Admitting: Internal Medicine

## 2013-10-20 VITALS — BP 177/93 | HR 98 | Temp 98.3°F | Resp 16 | Ht 62.0 in | Wt 163.0 lb

## 2013-10-20 DIAGNOSIS — L8990 Pressure ulcer of unspecified site, unspecified stage: Secondary | ICD-10-CM | POA: Diagnosis not present

## 2013-10-20 DIAGNOSIS — N189 Chronic kidney disease, unspecified: Secondary | ICD-10-CM | POA: Diagnosis not present

## 2013-10-20 DIAGNOSIS — I120 Hypertensive chronic kidney disease with stage 5 chronic kidney disease or end stage renal disease: Secondary | ICD-10-CM | POA: Diagnosis not present

## 2013-10-20 DIAGNOSIS — E119 Type 2 diabetes mellitus without complications: Secondary | ICD-10-CM | POA: Insufficient documentation

## 2013-10-20 DIAGNOSIS — E1151 Type 2 diabetes mellitus with diabetic peripheral angiopathy without gangrene: Secondary | ICD-10-CM

## 2013-10-20 DIAGNOSIS — S98139A Complete traumatic amputation of one unspecified lesser toe, initial encounter: Secondary | ICD-10-CM | POA: Diagnosis not present

## 2013-10-20 DIAGNOSIS — I798 Other disorders of arteries, arterioles and capillaries in diseases classified elsewhere: Secondary | ICD-10-CM

## 2013-10-20 DIAGNOSIS — I10 Essential (primary) hypertension: Secondary | ICD-10-CM | POA: Diagnosis not present

## 2013-10-20 DIAGNOSIS — Z992 Dependence on renal dialysis: Secondary | ICD-10-CM | POA: Diagnosis not present

## 2013-10-20 DIAGNOSIS — E1159 Type 2 diabetes mellitus with other circulatory complications: Secondary | ICD-10-CM

## 2013-10-20 DIAGNOSIS — L97509 Non-pressure chronic ulcer of other part of unspecified foot with unspecified severity: Secondary | ICD-10-CM | POA: Diagnosis not present

## 2013-10-20 DIAGNOSIS — L97529 Non-pressure chronic ulcer of other part of left foot with unspecified severity: Secondary | ICD-10-CM

## 2013-10-20 DIAGNOSIS — I129 Hypertensive chronic kidney disease with stage 1 through stage 4 chronic kidney disease, or unspecified chronic kidney disease: Secondary | ICD-10-CM | POA: Diagnosis not present

## 2013-10-20 DIAGNOSIS — N186 End stage renal disease: Secondary | ICD-10-CM | POA: Diagnosis not present

## 2013-10-20 DIAGNOSIS — S91309A Unspecified open wound, unspecified foot, initial encounter: Secondary | ICD-10-CM | POA: Diagnosis not present

## 2013-10-20 LAB — POCT GLYCOSYLATED HEMOGLOBIN (HGB A1C): Hemoglobin A1C: 5.8

## 2013-10-20 LAB — GLUCOSE, POCT (MANUAL RESULT ENTRY): POC GLUCOSE: 157 mg/dL — AB (ref 70–99)

## 2013-10-20 MED ORDER — CEPHALEXIN 500 MG PO CAPS: 500.0000 mg | ORAL_CAPSULE | Freq: Every day | ORAL | Status: DC

## 2013-10-20 MED ORDER — AMLODIPINE BESYLATE 10 MG PO TABS: 5.0000 mg | ORAL_TABLET | Freq: Every day | ORAL | Status: DC

## 2013-10-20 NOTE — Patient Instructions (Signed)
Diabetes and Foot Care Diabetes may cause you to have problems because of poor blood supply (circulation) to your feet and legs. This may cause the skin on your feet to become thinner, break easier, and heal more slowly. Your skin may become dry, and the skin may peel and crack. You may also have nerve damage in your legs and feet causing decreased feeling in them. You may not notice minor injuries to your feet that could lead to infections or more serious problems. Taking care of your feet is one of the most important things you can do for yourself.  HOME CARE INSTRUCTIONS  Wear shoes at all times, even in the house. Do not go barefoot. Bare feet are easily injured.  Check your feet daily for blisters, cuts, and redness. If you cannot see the bottom of your feet, use a mirror or ask someone for help.  Wash your feet with warm water (do not use hot water) and mild soap. Then pat your feet and the areas between your toes until they are completely dry. Do not soak your feet as this can dry your skin.  Apply a moisturizing lotion or petroleum jelly (that does not contain alcohol and is unscented) to the skin on your feet and to dry, brittle toenails. Do not apply lotion between your toes.  Trim your toenails straight across. Do not dig under them or around the cuticle. File the edges of your nails with an emery board or nail file.  Do not cut corns or calluses or try to remove them with medicine.  Wear clean socks or stockings every day. Make sure they are not too tight. Do not wear knee-high stockings since they may decrease blood flow to your legs.  Wear shoes that fit properly and have enough cushioning. To break in new shoes, wear them for just a few hours a day. This prevents you from injuring your feet. Always look in your shoes before you put them on to be sure there are no objects inside.  Do not cross your legs. This may decrease the blood flow to your feet.  If you find a minor scrape,  cut, or break in the skin on your feet, keep it and the skin around it clean and dry. These areas may be cleansed with mild soap and water. Do not cleanse the area with peroxide, alcohol, or iodine.  When you remove an adhesive bandage, be sure not to damage the skin around it.  If you have a wound, look at it several times a day to make sure it is healing.  Do not use heating pads or hot water bottles. They may burn your skin. If you have lost feeling in your feet or legs, you may not know it is happening until it is too late.  Make sure your health care provider performs a complete foot exam at least annually or more often if you have foot problems. Report any cuts, sores, or bruises to your health care provider immediately. SEEK MEDICAL CARE IF:   You have an injury that is not healing.  You have cuts or breaks in the skin.  You have an ingrown nail.  You notice redness on your legs or feet.  You feel burning or tingling in your legs or feet.  You have pain or cramps in your legs and feet.  Your legs or feet are numb.  Your feet always feel cold. SEEK IMMEDIATE MEDICAL CARE IF:   There is increasing redness,   swelling, or pain in or around a wound.  There is a red line that goes up your leg.  Pus is coming from a wound.  You develop a fever or as directed by your health care provider.  You notice a bad smell coming from an ulcer or wound. Document Released: 04/06/2000 Document Revised: 12/10/2012 Document Reviewed: 09/16/2012 Exit[employer] Patient Information 2015 [employer]. This information is not intended to replace advice given to you by your health care provider. Make sure you discuss any questions you have with your health care provider.  

## 2013-10-20 NOTE — Progress Notes (Signed)
Pt here to establish care for End stage renal disease on Dialysis 3 times a week with skin graft left femoral Left foot amputee with necrotic ulcer to heel and new one forming with drainage. Pt follow wound care @ [facility] every week with dressing change C/o muscle spasms in foot A1c,CBG ordered per protocol Need colonoscopy screening,mammogram and pap smear. Need medication refill

## 2013-10-20 NOTE — Progress Notes (Signed)
Cleaned foot ulcer with irrigation and dressed with 4x4 gauze to heel and front area and covered with mepilex dressing

## 2013-10-20 NOTE — Progress Notes (Signed)
Patient ID: [name], female   DOB: 09-Apr-1961, 53 y.o.   MRN: [id number]   WUJ:[id number]  [id number]  DOB - [birth year]  CC:  Chief Complaint  Patient presents with  . Establish Care  . Diabetes  . Hypertension  . Chronic Kidney Disease       HPI: [name] is a 53 y.o. female with significant past medical history, here today to establish medical care.  Patient reports a long history of hypertension and admits to skipping antihypertensive medication today. Patient reports several long-standing leg ulcers which is currently under care at [facility] long wound center. Patient reports that she has a care [name] nurse that comes every other week to change the dressings on her ulcers.  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 Patient also has a left foot heel necrotic ulcer. Patient reports that she has pain in her right leg that feels achy and has spasm at night for the past 2 months.    Allergies  Allergen Reactions  . Doxycycline Itching  . Peroxyl [Hydrogen Peroxide] Hives and Rash   Past Medical History  Diagnosis Date  . ESRD (end stage renal disease)   . Hypertension   . CVA (cerebral infarction)     right parietal 05/19/2000  . Vaginal bleeding   . Hyperparathyroidism   . Pneumonia   . Stroke     2002,no residual  . Peripheral vascular disease   . GERD (gastroesophageal reflux disease)   . Anemia   . DM (diabetes mellitus) type II controlled peripheral vascular disorder   . Slip/trip w/o falling due to stepping on object, subs July  2014   Current Outpatient Prescriptions on File Prior to Visit  Medication Sig Dispense Refill  . amLODipine (NORVASC) 10 MG tablet Take 5 mg by mouth at bedtime.       Marland Kitchen aspirin 81 MG tablet Take 81 mg by mouth daily.      Marland Kitchen Clopidogrel Bisulfate (PLAVIX PO) Take 75 mg by mouth daily.       Marland Kitchen esomeprazole (NEXIUM) 40 MG capsule Take 40 mg by mouth daily as needed (acid reflux).         Marland Kitchen glipiZIDE (GLUCOTROL) 10 MG tablet Take 5 mg by mouth daily.       . Multiple Vitamin (MULTIVITAMIN WITH MINERALS) TABS tablet Take 1 tablet by mouth daily.      . multivitamin (RENA-VIT) TABS tablet Take 1 tablet by mouth daily.      . traMADol (ULTRAM) 50 MG tablet Take 50 mg by mouth every 6 (six) hours as needed for moderate pain.       Marland Kitchen VITAMIN E PO Take 1 tablet by mouth daily as needed (Healing of foot).      . Acetaminophen (TYLENOL PO) Take 1-2 tablets by mouth See admin instructions. Uses at dialysis Mon, Wed, Fridays each week for muscle spasms      . CALCIUM-VITAMIN D PO Take 1 tablet by mouth 3 (three) times daily with meals.      . diphenhydrAMINE (BENADRYL) 25 MG tablet Take 25 mg by mouth every 6 (six) hours as needed (Muscle spasm in feet).      . Gauze Pads & Dressings (TELFA NON-ADHERENT DESSING) 2"X3" PADS 1 Units by Does not apply route daily.  100 each  1  . HYDROcodone-acetaminophen (NORCO/VICODIN) 5-325 MG per tablet Take 1 tablet by mouth every 6 (six) hours as needed for moderate pain.       Marland Kitchen  neomycin-bacitracin-polymyxin (NEOSPORIN) [phone] ointment Apply 1 application topically daily. Apply to foot      . polyethylene glycol (MIRALAX / GLYCOLAX) packet Take 17 g by mouth daily as needed for mild constipation.       No current facility-administered medications on file prior to visit.   Family History  Problem Relation Age of Onset  . Hypertension Father   . Kidney disease Mother    History   Social History  . Marital Status: Single    Spouse Name: N/A    Number of Children: N/A  . Years of Education: N/A   Occupational History  . Not on file.   Social History Main Topics  . Smoking status: Never Smoker   . Smokeless tobacco: Never Used  . Alcohol Use: Yes     Comment: occasional  . Drug Use: No  . Sexual Activity: No   Other Topics Concern  . Not on file   Social History Narrative  . No narrative on file   Review of Systems   Constitutional: Negative.   HENT: Negative.   Eyes: Negative.   Respiratory: Negative.   Cardiovascular: Positive for leg swelling. Negative for chest pain, palpitations, orthopnea and claudication.  Gastrointestinal: Positive for constipation (uses miralax).  Genitourinary: Negative.   Musculoskeletal: Negative.   Skin: Negative.   Neurological: Negative.   Endo/Heme/Allergies: Negative.   Psychiatric/Behavioral: Negative.        Objective:   Filed Vitals:   10/20/13 1611  BP: 177/93  Pulse: 98  Temp: 98.3 F (36.8 C)  Resp: 16    Physical Exam  Skin:        Lab Results  Component Value Date   WBC 9.1 09/15/2013   HGB 8.2* 09/15/2013   HCT 25.2* 09/15/2013   MCV 93.7 09/15/2013   PLT 395 09/15/2013   Lab Results  Component Value Date   CREATININE 8.43* 09/15/2013   BUN 35* 09/15/2013   NA 135* 09/15/2013   K 4.2 09/15/2013   CL 91* 09/15/2013   CO2 28 09/15/2013    Lab Results  Component Value Date   HGBA1C 5.8 10/20/2013   Lipid Panel  No results found for this basename: chol, trig, hdl, cholhdl, vldl, ldlcalc       Assessment and plan:   Halynn was seen today for establish care, diabetes, hypertension and chronic kidney disease.  Diagnoses and associated orders for this visit:  DM (diabetes mellitus) type II controlled peripheral vascular disorder - HgB A1c - Glucose (CBG)  Essential hypertension - May continue amLODipine (NORVASC) 10 MG tablet; Take 0.5 tablets (5 mg total) by mouth at bedtime. - Lipid panel; Future  Foot ulcer, left, with unspecified severity - cephALEXin (KEFLEX) 500 MG capsule; Take 1 capsule (500 mg total) by mouth daily. Low dose due to ESRD.   Return in about 3 months (around 01/20/2014) for DM/HTN.    Chari Manning, NP-C [facility] [phone] 10/25/2013, 11:09 PM

## 2013-10-21 DIAGNOSIS — N186 End stage renal disease: Secondary | ICD-10-CM | POA: Diagnosis not present

## 2013-10-21 DIAGNOSIS — E1129 Type 2 diabetes mellitus with other diabetic kidney complication: Secondary | ICD-10-CM | POA: Diagnosis not present

## 2013-10-21 DIAGNOSIS — D631 Anemia in chronic kidney disease: Secondary | ICD-10-CM | POA: Diagnosis not present

## 2013-10-22 DIAGNOSIS — Z992 Dependence on renal dialysis: Secondary | ICD-10-CM | POA: Diagnosis not present

## 2013-10-22 DIAGNOSIS — I120 Hypertensive chronic kidney disease with stage 5 chronic kidney disease or end stage renal disease: Secondary | ICD-10-CM | POA: Diagnosis not present

## 2013-10-22 DIAGNOSIS — S91309A Unspecified open wound, unspecified foot, initial encounter: Secondary | ICD-10-CM | POA: Diagnosis not present

## 2013-10-22 DIAGNOSIS — E119 Type 2 diabetes mellitus without complications: Secondary | ICD-10-CM | POA: Diagnosis not present

## 2013-10-22 DIAGNOSIS — N186 End stage renal disease: Secondary | ICD-10-CM | POA: Diagnosis not present

## 2013-10-22 DIAGNOSIS — S98139A Complete traumatic amputation of one unspecified lesser toe, initial encounter: Secondary | ICD-10-CM | POA: Diagnosis not present

## 2013-10-24 DIAGNOSIS — Z992 Dependence on renal dialysis: Secondary | ICD-10-CM | POA: Diagnosis not present

## 2013-10-24 DIAGNOSIS — S98139A Complete traumatic amputation of one unspecified lesser toe, initial encounter: Secondary | ICD-10-CM | POA: Diagnosis not present

## 2013-10-24 DIAGNOSIS — N186 End stage renal disease: Secondary | ICD-10-CM | POA: Diagnosis not present

## 2013-10-24 DIAGNOSIS — S91309A Unspecified open wound, unspecified foot, initial encounter: Secondary | ICD-10-CM | POA: Diagnosis not present

## 2013-10-24 DIAGNOSIS — E119 Type 2 diabetes mellitus without complications: Secondary | ICD-10-CM | POA: Diagnosis not present

## 2013-10-24 DIAGNOSIS — I120 Hypertensive chronic kidney disease with stage 5 chronic kidney disease or end stage renal disease: Secondary | ICD-10-CM | POA: Diagnosis not present

## 2013-10-27 ENCOUNTER — Encounter: Payer: Self-pay | Admitting: *Deleted

## 2013-10-27 ENCOUNTER — Ambulatory Visit: Payer: Medicare Other | Attending: Internal Medicine

## 2013-10-27 ENCOUNTER — Ambulatory Visit: Payer: Medicare Other

## 2013-10-27 DIAGNOSIS — IMO0001 Reserved for inherently not codable concepts without codable children: Secondary | ICD-10-CM

## 2013-10-27 DIAGNOSIS — I10 Essential (primary) hypertension: Secondary | ICD-10-CM | POA: Diagnosis not present

## 2013-10-27 DIAGNOSIS — T814XXA Infection following a procedure, initial encounter: Principal | ICD-10-CM

## 2013-10-27 DIAGNOSIS — L089 Local infection of the skin and subcutaneous tissue, unspecified: Secondary | ICD-10-CM | POA: Diagnosis not present

## 2013-10-27 LAB — LIPID PANEL
CHOL/HDL RATIO: 2.8 ratio
CHOLESTEROL: 136 mg/dL (ref 0–200)
HDL: 48 mg/dL (ref >39)
LDL Cholesterol: 73 mg/dL (ref 0–99)
Triglycerides: 74 mg/dL (ref <150)
VLDL: 15 mg/dL (ref 0–40)

## 2013-10-27 NOTE — Progress Notes (Unsigned)
Patient presents today c/o odor from wound on left foot where her toes were amputated. Patient does have some puss like discharge with an odor.  Patient has second wound on the bottom of left heal that is clean, dry and intact. Patients left foot does have some dry skin peeling off. Wound culture obtained, wet to dry dressing done per Roney Jaffe, NP. Patient informed to continue to take antibiotics and follow up with wound clinic on Thursday. Patient verbalized understanding.

## 2013-10-29 ENCOUNTER — Emergency Department (INDEPENDENT_AMBULATORY_CARE_PROVIDER_SITE_OTHER): Payer: Medicare Other

## 2013-10-29 ENCOUNTER — Encounter (HOSPITAL_BASED_OUTPATIENT_CLINIC_OR_DEPARTMENT_OTHER): Payer: Medicare Other | Attending: Internal Medicine

## 2013-10-29 ENCOUNTER — Telehealth: Payer: Self-pay | Admitting: *Deleted

## 2013-10-29 ENCOUNTER — Encounter (HOSPITAL_COMMUNITY): Payer: Self-pay | Admitting: Emergency Medicine

## 2013-10-29 ENCOUNTER — Emergency Department (INDEPENDENT_AMBULATORY_CARE_PROVIDER_SITE_OTHER)
Admission: EM | Admit: 2013-10-29 | Discharge: 2013-10-29 | Disposition: A | Discharge status: Home / Self Care | Payer: Medicare Other | Source: Home / Self Care | Attending: Emergency Medicine | Admitting: Emergency Medicine

## 2013-10-29 DIAGNOSIS — Y92009 Unspecified place in unspecified non-institutional (private) residence as the place of occurrence of the external cause: Secondary | ICD-10-CM

## 2013-10-29 DIAGNOSIS — I96 Gangrene, not elsewhere classified: Secondary | ICD-10-CM | POA: Insufficient documentation

## 2013-10-29 DIAGNOSIS — W19XXXA Unspecified fall, initial encounter: Secondary | ICD-10-CM | POA: Diagnosis not present

## 2013-10-29 DIAGNOSIS — E1159 Type 2 diabetes mellitus with other circulatory complications: Secondary | ICD-10-CM | POA: Insufficient documentation

## 2013-10-29 DIAGNOSIS — T1490XA Injury, unspecified, initial encounter: Secondary | ICD-10-CM

## 2013-10-29 DIAGNOSIS — L97509 Non-pressure chronic ulcer of other part of unspecified foot with unspecified severity: Secondary | ICD-10-CM | POA: Insufficient documentation

## 2013-10-29 DIAGNOSIS — E1169 Type 2 diabetes mellitus with other specified complication: Secondary | ICD-10-CM | POA: Insufficient documentation

## 2013-10-29 DIAGNOSIS — S98919A Complete traumatic amputation of unspecified foot, level unspecified, initial encounter: Secondary | ICD-10-CM | POA: Diagnosis not present

## 2013-10-29 DIAGNOSIS — S9030XA Contusion of unspecified foot, initial encounter: Secondary | ICD-10-CM

## 2013-10-29 DIAGNOSIS — S9031XA Contusion of right foot, initial encounter: Secondary | ICD-10-CM

## 2013-10-29 LAB — GLUCOSE, CAPILLARY: GLUCOSE-CAPILLARY: 234 mg/dL — AB (ref 70–99)

## 2013-10-29 MED ORDER — HYDROCODONE-ACETAMINOPHEN 5-325 MG PO TABS: ORAL_TABLET | ORAL | Status: DC

## 2013-10-29 NOTE — Telephone Encounter (Signed)
Patient called left a message wanting to know if she had an appointment at [facility]. Patient did not answer. Left a message for patient stating she has no appointment at [facility] but at [place] at 3:45 PM.

## 2013-10-29 NOTE — ED Provider Notes (Signed)
Chief Complaint   Chief Complaint  Patient presents with  . Foot Pain    History of Present Illness   [name] is an unfortunate 53 year old female with diabetes, chronic kidney disease on hemodialysis, and peripheral artery disease with a previous forefoot amputation involving the right foot. The patient sustained a fall 3 days ago in which he bumped her right foot. It's been painful since then. She has 2, large nonhealing ulcers on the foot. She is being followed at the wound care center and has appointment this afternoon. They're draining malodorous drainage. She denies any loss of consciousness, head injury, or any injury anywhere else.  Review of Systems   Other than as noted above, the patient denies any of the following symptoms: Systemic:  No fevers or chills. Musculoskeletal:  No joint pain or arthritis.  Neurological:  No muscular weakness, paresthesias.   [place]   Past medical history, family history, social history, meds, and allergies were reviewed.   She has diabetes and hypertension. Current meds include amlodipine, aspirin, Plavix, Nexium, and Glucotrol.  Physical  Examination     Vital signs:  BP 167/80  Pulse 103  Temp(Src) 98.1 F (36.7 C) (Oral)  Resp 20  SpO2 100%  LMP 10/13/2013 Gen:  Alert and oriented times 3.  In no distress. Musculoskeletal:  Exam of the foot reveals she is status post forefoot amputation. The remainder of her foot does not look good. She has 2, large, necrotic ulcers one on the heel and one on the anterior portion of the stump. These are both ulcerated and draining bile smelling, malodorous drainage the foot is diffusely tender to palpation.  Otherwise, all joints had a full a ROM with no swelling, bruising or deformity.  No edema, pedal pulses not felt. Extremities were cool but pink in color.  Capillary refill was slow.  Skin:  Clear, warm and dry.  No rash. Neuro:  Alert and oriented times 3.  Muscle strength was normal.   Sensation was intact to light touch.    Radiology   Dg Foot Complete Left  10/29/2013   CLINICAL DATA:  Open wound.   Prior amputation.  EXAM: LEFT FOOT - COMPLETE 3+ VIEW  COMPARISON:  09/15/2013.  FINDINGS: Ulceration noted lateral aspect of the left foot. No acute bony abnormality identified. Diffuse degenerative change and osteopenia. Vascular calcification.  IMPRESSION: 1. Ulceration noted of the distal lateral aspect of the left foot. No underlying acute bony abnormality. No foreign body.  2. Peripheral vascular disease.   Electronically Signed   By: Marcello Moores  Register   On: 10/29/2013 11:54   I reviewed the images independently and personally and concur with the radiologist's findings.  Assessment   The primary encounter diagnosis was Contusion of right foot, initial encounter. Diagnoses of Fall with injury and Place of occurrence, home were also pertinent to this visit.  There is no fracture but the foot does not look good and I don't think itself it up. I think she may need to ultimately undergo a below-the-knee amputation. She was urged to followup with wound care Center.  Plan    1.  Meds:  The following meds were prescribed:   Discharge Medication List as of 10/29/2013 12:09 PM    START taking these medications   Details  !! HYDROcodone-acetaminophen (NORCO/VICODIN) 5-325 MG per tablet 1 to 2 tabs every 4 to 6 hours as needed for pain., Print     !! - Potential duplicate medications found. Please discuss with  provider.      2.  Patient Education/Counseling:  The patient was given appropriate handouts, self care instructions, and instructed in symptomatic relief including rest and activity, and elevation.    3.  Follow up:  The patient was told to follow up here if no better in 3 to 4 days, or sooner if becoming worse in any way, and given some red flag symptoms such as worsening pain or neurological symptoms which would prompt immediate return.  Follow up here as  necessary.       Harden Mo, MD 10/29/13 [phone]

## 2013-10-29 NOTE — Discharge Instructions (Signed)
Follow up at wound care center today.

## 2013-10-29 NOTE — ED Notes (Signed)
Has an appointment later today 3:45 pm at the wound care center. States she fell and injured her foot a few days ago, and it has opened up and started to drain again. Foul odor

## 2013-10-30 DIAGNOSIS — E1169 Type 2 diabetes mellitus with other specified complication: Secondary | ICD-10-CM | POA: Diagnosis not present

## 2013-10-30 DIAGNOSIS — L97509 Non-pressure chronic ulcer of other part of unspecified foot with unspecified severity: Secondary | ICD-10-CM | POA: Diagnosis not present

## 2013-10-30 LAB — WOUND CULTURE
GRAM STAIN: NONE SEEN
Gram Stain: NONE SEEN

## 2013-10-31 DIAGNOSIS — S91309A Unspecified open wound, unspecified foot, initial encounter: Secondary | ICD-10-CM | POA: Diagnosis not present

## 2013-10-31 DIAGNOSIS — N186 End stage renal disease: Secondary | ICD-10-CM | POA: Diagnosis not present

## 2013-10-31 DIAGNOSIS — S98139A Complete traumatic amputation of one unspecified lesser toe, initial encounter: Secondary | ICD-10-CM | POA: Diagnosis not present

## 2013-10-31 DIAGNOSIS — E119 Type 2 diabetes mellitus without complications: Secondary | ICD-10-CM | POA: Diagnosis not present

## 2013-10-31 DIAGNOSIS — I120 Hypertensive chronic kidney disease with stage 5 chronic kidney disease or end stage renal disease: Secondary | ICD-10-CM | POA: Diagnosis not present

## 2013-10-31 DIAGNOSIS — Z992 Dependence on renal dialysis: Secondary | ICD-10-CM | POA: Diagnosis not present

## 2013-11-03 DIAGNOSIS — S91309A Unspecified open wound, unspecified foot, initial encounter: Secondary | ICD-10-CM | POA: Diagnosis not present

## 2013-11-03 DIAGNOSIS — N186 End stage renal disease: Secondary | ICD-10-CM | POA: Diagnosis not present

## 2013-11-03 DIAGNOSIS — I120 Hypertensive chronic kidney disease with stage 5 chronic kidney disease or end stage renal disease: Secondary | ICD-10-CM | POA: Diagnosis not present

## 2013-11-03 DIAGNOSIS — Z992 Dependence on renal dialysis: Secondary | ICD-10-CM | POA: Diagnosis not present

## 2013-11-03 DIAGNOSIS — E119 Type 2 diabetes mellitus without complications: Secondary | ICD-10-CM | POA: Diagnosis not present

## 2013-11-03 DIAGNOSIS — S98139A Complete traumatic amputation of one unspecified lesser toe, initial encounter: Secondary | ICD-10-CM | POA: Diagnosis not present

## 2013-11-07 DIAGNOSIS — Z992 Dependence on renal dialysis: Secondary | ICD-10-CM | POA: Diagnosis not present

## 2013-11-07 DIAGNOSIS — I120 Hypertensive chronic kidney disease with stage 5 chronic kidney disease or end stage renal disease: Secondary | ICD-10-CM | POA: Diagnosis not present

## 2013-11-07 DIAGNOSIS — N186 End stage renal disease: Secondary | ICD-10-CM | POA: Diagnosis not present

## 2013-11-07 DIAGNOSIS — S91309A Unspecified open wound, unspecified foot, initial encounter: Secondary | ICD-10-CM | POA: Diagnosis not present

## 2013-11-07 DIAGNOSIS — S98139A Complete traumatic amputation of one unspecified lesser toe, initial encounter: Secondary | ICD-10-CM | POA: Diagnosis not present

## 2013-11-07 DIAGNOSIS — E119 Type 2 diabetes mellitus without complications: Secondary | ICD-10-CM | POA: Diagnosis not present

## 2013-11-10 DIAGNOSIS — T82898A Other specified complication of vascular prosthetic devices, implants and grafts, initial encounter: Secondary | ICD-10-CM | POA: Diagnosis not present

## 2013-11-10 DIAGNOSIS — S98139A Complete traumatic amputation of one unspecified lesser toe, initial encounter: Secondary | ICD-10-CM | POA: Diagnosis not present

## 2013-11-10 DIAGNOSIS — E119 Type 2 diabetes mellitus without complications: Secondary | ICD-10-CM | POA: Diagnosis not present

## 2013-11-10 DIAGNOSIS — Z992 Dependence on renal dialysis: Secondary | ICD-10-CM | POA: Diagnosis not present

## 2013-11-10 DIAGNOSIS — N186 End stage renal disease: Secondary | ICD-10-CM | POA: Diagnosis not present

## 2013-11-10 DIAGNOSIS — I120 Hypertensive chronic kidney disease with stage 5 chronic kidney disease or end stage renal disease: Secondary | ICD-10-CM | POA: Diagnosis not present

## 2013-11-10 DIAGNOSIS — I871 Compression of vein: Secondary | ICD-10-CM | POA: Diagnosis not present

## 2013-11-10 DIAGNOSIS — S91309A Unspecified open wound, unspecified foot, initial encounter: Secondary | ICD-10-CM | POA: Diagnosis not present

## 2013-11-11 DIAGNOSIS — E1129 Type 2 diabetes mellitus with other diabetic kidney complication: Secondary | ICD-10-CM | POA: Diagnosis not present

## 2013-11-12 DIAGNOSIS — E1159 Type 2 diabetes mellitus with other circulatory complications: Secondary | ICD-10-CM | POA: Diagnosis not present

## 2013-11-12 DIAGNOSIS — I96 Gangrene, not elsewhere classified: Secondary | ICD-10-CM | POA: Diagnosis not present

## 2013-11-12 DIAGNOSIS — L97509 Non-pressure chronic ulcer of other part of unspecified foot with unspecified severity: Secondary | ICD-10-CM | POA: Diagnosis not present

## 2013-11-12 DIAGNOSIS — E1169 Type 2 diabetes mellitus with other specified complication: Secondary | ICD-10-CM | POA: Diagnosis not present

## 2013-11-12 LAB — GLUCOSE, CAPILLARY: Glucose-Capillary: 130 mg/dL — ABNORMAL HIGH (ref 70–99)

## 2013-11-14 DIAGNOSIS — Z992 Dependence on renal dialysis: Secondary | ICD-10-CM | POA: Diagnosis not present

## 2013-11-14 DIAGNOSIS — I120 Hypertensive chronic kidney disease with stage 5 chronic kidney disease or end stage renal disease: Secondary | ICD-10-CM | POA: Diagnosis not present

## 2013-11-14 DIAGNOSIS — S98139A Complete traumatic amputation of one unspecified lesser toe, initial encounter: Secondary | ICD-10-CM | POA: Diagnosis not present

## 2013-11-14 DIAGNOSIS — N186 End stage renal disease: Secondary | ICD-10-CM | POA: Diagnosis not present

## 2013-11-14 DIAGNOSIS — S91309A Unspecified open wound, unspecified foot, initial encounter: Secondary | ICD-10-CM | POA: Diagnosis not present

## 2013-11-14 DIAGNOSIS — E119 Type 2 diabetes mellitus without complications: Secondary | ICD-10-CM | POA: Diagnosis not present

## 2013-11-17 DIAGNOSIS — S98139A Complete traumatic amputation of one unspecified lesser toe, initial encounter: Secondary | ICD-10-CM | POA: Diagnosis not present

## 2013-11-17 DIAGNOSIS — N186 End stage renal disease: Secondary | ICD-10-CM | POA: Diagnosis not present

## 2013-11-17 DIAGNOSIS — S91309A Unspecified open wound, unspecified foot, initial encounter: Secondary | ICD-10-CM | POA: Diagnosis not present

## 2013-11-17 DIAGNOSIS — E119 Type 2 diabetes mellitus without complications: Secondary | ICD-10-CM | POA: Diagnosis not present

## 2013-11-17 DIAGNOSIS — Z992 Dependence on renal dialysis: Secondary | ICD-10-CM | POA: Diagnosis not present

## 2013-11-17 DIAGNOSIS — I120 Hypertensive chronic kidney disease with stage 5 chronic kidney disease or end stage renal disease: Secondary | ICD-10-CM | POA: Diagnosis not present

## 2013-11-19 ENCOUNTER — Encounter: Payer: Self-pay | Admitting: Podiatrist

## 2013-11-19 ENCOUNTER — Ambulatory Visit (INDEPENDENT_AMBULATORY_CARE_PROVIDER_SITE_OTHER): Payer: Medicare Other | Admitting: Podiatrist

## 2013-11-19 VITALS — BP 162/89 | HR 99 | Resp 12

## 2013-11-19 DIAGNOSIS — L97422 Non-pressure chronic ulcer of left heel and midfoot with fat layer exposed: Secondary | ICD-10-CM

## 2013-11-19 DIAGNOSIS — L97409 Non-pressure chronic ulcer of unspecified heel and midfoot with unspecified severity: Secondary | ICD-10-CM | POA: Diagnosis not present

## 2013-11-19 DIAGNOSIS — S98919A Complete traumatic amputation of unspecified foot, level unspecified, initial encounter: Secondary | ICD-10-CM

## 2013-11-19 DIAGNOSIS — Z89432 Acquired absence of left foot: Secondary | ICD-10-CM

## 2013-11-20 DIAGNOSIS — N186 End stage renal disease: Secondary | ICD-10-CM | POA: Diagnosis not present

## 2013-11-20 NOTE — Progress Notes (Signed)
Subjective: Patient presents today for followup of left heel pain and ulceration distal stump of trans metatarsal amputation. She is being seen at the wound care center and is under the care of Dr. Judene Companion. She has a callus on the heel and she has come here for care. Overall she feels well denies any systemic signs or symptoms of infection  Objective: Distal lateral portion of the stump has a dehisced wound. There is to be necrotic tissue present and an  underlying granular base is seen. No malodor is noted however the wound does not appear to be healing. Her left heel has a large area of thick skin which is very loose from its attachment. He'll also appears to be very dry. Vascular status is decreased with nonpalpable pedal pulses and swelling to the left stump.  Assessment: PVD, transmetatarsal amputation with nonhealing surgical wound, heel callus  Plan: A conservative debridement of the calloused heel was carried out today without complication. We redressed the foot and the ulcer with Iodosorb and a compressive dressing. I recommended that she continue to followup with the wound care center for this wound from here on out. She will also followup with the surgeon who performed her amputation for further treatment.

## 2013-11-21 DIAGNOSIS — S91309A Unspecified open wound, unspecified foot, initial encounter: Secondary | ICD-10-CM | POA: Diagnosis not present

## 2013-11-21 DIAGNOSIS — S98139A Complete traumatic amputation of one unspecified lesser toe, initial encounter: Secondary | ICD-10-CM | POA: Diagnosis not present

## 2013-11-21 DIAGNOSIS — N186 End stage renal disease: Secondary | ICD-10-CM | POA: Diagnosis not present

## 2013-11-21 DIAGNOSIS — I120 Hypertensive chronic kidney disease with stage 5 chronic kidney disease or end stage renal disease: Secondary | ICD-10-CM | POA: Diagnosis not present

## 2013-11-21 DIAGNOSIS — E119 Type 2 diabetes mellitus without complications: Secondary | ICD-10-CM | POA: Diagnosis not present

## 2013-11-21 DIAGNOSIS — Z992 Dependence on renal dialysis: Secondary | ICD-10-CM | POA: Diagnosis not present

## 2013-11-23 DIAGNOSIS — D631 Anemia in chronic kidney disease: Secondary | ICD-10-CM | POA: Diagnosis not present

## 2013-11-23 DIAGNOSIS — N186 End stage renal disease: Secondary | ICD-10-CM | POA: Diagnosis not present

## 2013-11-23 DIAGNOSIS — E1129 Type 2 diabetes mellitus with other diabetic kidney complication: Secondary | ICD-10-CM | POA: Diagnosis not present

## 2013-11-24 DIAGNOSIS — Z992 Dependence on renal dialysis: Secondary | ICD-10-CM | POA: Diagnosis not present

## 2013-11-24 DIAGNOSIS — E119 Type 2 diabetes mellitus without complications: Secondary | ICD-10-CM | POA: Diagnosis not present

## 2013-11-24 DIAGNOSIS — N186 End stage renal disease: Secondary | ICD-10-CM | POA: Diagnosis not present

## 2013-11-24 DIAGNOSIS — I120 Hypertensive chronic kidney disease with stage 5 chronic kidney disease or end stage renal disease: Secondary | ICD-10-CM | POA: Diagnosis not present

## 2013-11-24 DIAGNOSIS — S98139A Complete traumatic amputation of one unspecified lesser toe, initial encounter: Secondary | ICD-10-CM | POA: Diagnosis not present

## 2013-11-24 DIAGNOSIS — S91309A Unspecified open wound, unspecified foot, initial encounter: Secondary | ICD-10-CM | POA: Diagnosis not present

## 2013-11-26 ENCOUNTER — Encounter (HOSPITAL_BASED_OUTPATIENT_CLINIC_OR_DEPARTMENT_OTHER): Payer: Medicare Other | Attending: Internal Medicine

## 2013-11-26 DIAGNOSIS — L97509 Non-pressure chronic ulcer of other part of unspecified foot with unspecified severity: Secondary | ICD-10-CM | POA: Insufficient documentation

## 2013-11-26 DIAGNOSIS — E1169 Type 2 diabetes mellitus with other specified complication: Secondary | ICD-10-CM | POA: Insufficient documentation

## 2013-11-26 DIAGNOSIS — I120 Hypertensive chronic kidney disease with stage 5 chronic kidney disease or end stage renal disease: Secondary | ICD-10-CM | POA: Diagnosis not present

## 2013-11-26 DIAGNOSIS — S98139A Complete traumatic amputation of one unspecified lesser toe, initial encounter: Secondary | ICD-10-CM | POA: Diagnosis not present

## 2013-11-26 DIAGNOSIS — S91309A Unspecified open wound, unspecified foot, initial encounter: Secondary | ICD-10-CM | POA: Diagnosis not present

## 2013-11-26 DIAGNOSIS — N186 End stage renal disease: Secondary | ICD-10-CM | POA: Diagnosis not present

## 2013-11-26 DIAGNOSIS — S98919A Complete traumatic amputation of unspecified foot, level unspecified, initial encounter: Secondary | ICD-10-CM | POA: Insufficient documentation

## 2013-11-26 DIAGNOSIS — Z992 Dependence on renal dialysis: Secondary | ICD-10-CM | POA: Diagnosis not present

## 2013-11-26 DIAGNOSIS — L97409 Non-pressure chronic ulcer of unspecified heel and midfoot with unspecified severity: Secondary | ICD-10-CM | POA: Insufficient documentation

## 2013-11-26 DIAGNOSIS — E119 Type 2 diabetes mellitus without complications: Secondary | ICD-10-CM | POA: Diagnosis not present

## 2013-11-28 DIAGNOSIS — E119 Type 2 diabetes mellitus without complications: Secondary | ICD-10-CM | POA: Diagnosis not present

## 2013-11-28 DIAGNOSIS — Z992 Dependence on renal dialysis: Secondary | ICD-10-CM | POA: Diagnosis not present

## 2013-11-28 DIAGNOSIS — N186 End stage renal disease: Secondary | ICD-10-CM | POA: Diagnosis not present

## 2013-11-28 DIAGNOSIS — I120 Hypertensive chronic kidney disease with stage 5 chronic kidney disease or end stage renal disease: Secondary | ICD-10-CM | POA: Diagnosis not present

## 2013-11-28 DIAGNOSIS — S91309A Unspecified open wound, unspecified foot, initial encounter: Secondary | ICD-10-CM | POA: Diagnosis not present

## 2013-11-28 DIAGNOSIS — S98139A Complete traumatic amputation of one unspecified lesser toe, initial encounter: Secondary | ICD-10-CM | POA: Diagnosis not present

## 2013-12-01 DIAGNOSIS — I120 Hypertensive chronic kidney disease with stage 5 chronic kidney disease or end stage renal disease: Secondary | ICD-10-CM | POA: Diagnosis not present

## 2013-12-01 DIAGNOSIS — N186 End stage renal disease: Secondary | ICD-10-CM | POA: Diagnosis not present

## 2013-12-01 DIAGNOSIS — E119 Type 2 diabetes mellitus without complications: Secondary | ICD-10-CM | POA: Diagnosis not present

## 2013-12-01 DIAGNOSIS — S98139A Complete traumatic amputation of one unspecified lesser toe, initial encounter: Secondary | ICD-10-CM | POA: Diagnosis not present

## 2013-12-01 DIAGNOSIS — S91309A Unspecified open wound, unspecified foot, initial encounter: Secondary | ICD-10-CM | POA: Diagnosis not present

## 2013-12-01 DIAGNOSIS — Z992 Dependence on renal dialysis: Secondary | ICD-10-CM | POA: Diagnosis not present

## 2013-12-02 ENCOUNTER — Telehealth: Payer: Self-pay | Admitting: Vascular Surgery

## 2013-12-02 NOTE — Telephone Encounter (Addendum)
Message copied by Doristine Section on Wed Dec 02, 2013  3:22 PM ------      Message from: Angelia Mould      Created: Wed Dec 02, 2013 12:41 PM      Regarding: office visit       Looks like this pt needs an office visit to check on her Left TMA.      I have the angio from [place].      Thanks      Gerald Stabs  ------  notified patient of post op appt. with dr. [name] on 12-10-13 3:30

## 2013-12-03 DIAGNOSIS — L97509 Non-pressure chronic ulcer of other part of unspecified foot with unspecified severity: Secondary | ICD-10-CM | POA: Diagnosis not present

## 2013-12-03 DIAGNOSIS — L97409 Non-pressure chronic ulcer of unspecified heel and midfoot with unspecified severity: Secondary | ICD-10-CM | POA: Diagnosis not present

## 2013-12-03 DIAGNOSIS — S98919A Complete traumatic amputation of unspecified foot, level unspecified, initial encounter: Secondary | ICD-10-CM | POA: Diagnosis not present

## 2013-12-03 DIAGNOSIS — E1169 Type 2 diabetes mellitus with other specified complication: Secondary | ICD-10-CM | POA: Diagnosis not present

## 2013-12-05 DIAGNOSIS — Z992 Dependence on renal dialysis: Secondary | ICD-10-CM | POA: Diagnosis not present

## 2013-12-05 DIAGNOSIS — I120 Hypertensive chronic kidney disease with stage 5 chronic kidney disease or end stage renal disease: Secondary | ICD-10-CM | POA: Diagnosis not present

## 2013-12-05 DIAGNOSIS — E119 Type 2 diabetes mellitus without complications: Secondary | ICD-10-CM | POA: Diagnosis not present

## 2013-12-05 DIAGNOSIS — S91309A Unspecified open wound, unspecified foot, initial encounter: Secondary | ICD-10-CM | POA: Diagnosis not present

## 2013-12-05 DIAGNOSIS — N186 End stage renal disease: Secondary | ICD-10-CM | POA: Diagnosis not present

## 2013-12-05 DIAGNOSIS — S98139A Complete traumatic amputation of one unspecified lesser toe, initial encounter: Secondary | ICD-10-CM | POA: Diagnosis not present

## 2013-12-08 DIAGNOSIS — I120 Hypertensive chronic kidney disease with stage 5 chronic kidney disease or end stage renal disease: Secondary | ICD-10-CM | POA: Diagnosis not present

## 2013-12-08 DIAGNOSIS — S91309A Unspecified open wound, unspecified foot, initial encounter: Secondary | ICD-10-CM | POA: Diagnosis not present

## 2013-12-08 DIAGNOSIS — N186 End stage renal disease: Secondary | ICD-10-CM | POA: Diagnosis not present

## 2013-12-08 DIAGNOSIS — S98139A Complete traumatic amputation of one unspecified lesser toe, initial encounter: Secondary | ICD-10-CM | POA: Diagnosis not present

## 2013-12-08 DIAGNOSIS — E119 Type 2 diabetes mellitus without complications: Secondary | ICD-10-CM | POA: Diagnosis not present

## 2013-12-08 DIAGNOSIS — Z992 Dependence on renal dialysis: Secondary | ICD-10-CM | POA: Diagnosis not present

## 2013-12-09 ENCOUNTER — Encounter: Payer: Self-pay | Admitting: Vascular Surgery

## 2013-12-09 DIAGNOSIS — S91302D Unspecified open wound, left foot, subsequent encounter: Secondary | ICD-10-CM | POA: Diagnosis not present

## 2013-12-09 DIAGNOSIS — I120 Hypertensive chronic kidney disease with stage 5 chronic kidney disease or end stage renal disease: Secondary | ICD-10-CM | POA: Diagnosis not present

## 2013-12-09 DIAGNOSIS — S91329A Laceration with foreign body, unspecified foot, initial encounter: Secondary | ICD-10-CM | POA: Diagnosis not present

## 2013-12-09 DIAGNOSIS — Z89422 Acquired absence of other left toe(s): Secondary | ICD-10-CM | POA: Diagnosis not present

## 2013-12-09 DIAGNOSIS — N186 End stage renal disease: Secondary | ICD-10-CM | POA: Diagnosis not present

## 2013-12-09 DIAGNOSIS — E119 Type 2 diabetes mellitus without complications: Secondary | ICD-10-CM | POA: Diagnosis not present

## 2013-12-09 DIAGNOSIS — Z992 Dependence on renal dialysis: Secondary | ICD-10-CM | POA: Diagnosis not present

## 2013-12-09 DIAGNOSIS — L89620 Pressure ulcer of left heel, unstageable: Secondary | ICD-10-CM | POA: Diagnosis not present

## 2013-12-10 ENCOUNTER — Ambulatory Visit (INDEPENDENT_AMBULATORY_CARE_PROVIDER_SITE_OTHER): Payer: Medicare Other | Admitting: Vascular Surgery

## 2013-12-10 ENCOUNTER — Encounter: Payer: Self-pay | Admitting: Vascular Surgery

## 2013-12-10 VITALS — BP 179/87 | HR 99 | Resp 16 | Ht 62.0 in | Wt 165.0 lb

## 2013-12-10 DIAGNOSIS — I70269 Atherosclerosis of native arteries of extremities with gangrene, unspecified extremity: Secondary | ICD-10-CM

## 2013-12-10 NOTE — Assessment & Plan Note (Signed)
This patient has dry gangrene of the left heel and also an open ulcer on the lateral aspect of her left transmetatarsal amputation. I have reviewed her arteriogram that was done in [facility] last month. 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 She underwent PTA of the posterior tibial artery however this artery is occluded distally so there are really no further options for revascularization. I would recommend continued aggressive wound care by the wound care center. If this fails to heal unfortunately she would require a below the knee amputation. However I explained to her that I did not see any other options for revascularization on the left.

## 2013-12-10 NOTE — Progress Notes (Signed)
[facility]  Patient name: [name] MRN: [id number] DOB: [birth year] Sex: female  REASON FOR VISIT: follow up of left foot wound.  HPI: [name] is a 53 y.o. female who I last saw on 09/09/2013 with a nonhealing wound of the left foot. This was on the lateral aspect of her transmetatarsal amputation site. This had been performed on 07/15/2012. At that time she told me she had undergone an arteriogram in [facility] and she obtained those x-rays for [place] to review. At that time, on exam she had a full thickness wound of her left heel and an open ulcer on the lateral aspect of her transmetatarsal amputation site. She had evidence of multilevel arterial occlusive disease on exam and I could not palpate a femoral pulse on the left although she had a monophasic femoral signal. I felt that without revascularization clearly she was at risk for limb loss.  I reviewed her study that was done in [facility] on 09/29/2013. At that time she had successful PTA of a left posterior tibial artery stenosis. However, distally the posterior tibial artery is occluded. Her dominant runoff was the anterior tibial artery on the left.  She is followed at the wound care center for her healed wound in wound on the lateral aspect of her TMA. She denies any significant rest pain. She denies fever or chills.   Past Medical History  Diagnosis Date  . ESRD (end stage renal disease)   . Hypertension   . CVA (cerebral infarction)     right parietal 05/19/2000  . Vaginal bleeding   . Hyperparathyroidism   . Pneumonia   . Stroke     2002,no residual  . Peripheral vascular disease   . GERD (gastroesophageal reflux disease)   . Anemia   . DM (diabetes mellitus) type II controlled peripheral vascular disorder   . Slip/trip w/o falling due to stepping on object, subs July  2014   Family History  Problem Relation Age of Onset  . Hypertension Father   . Kidney  disease Mother    SOCIAL HISTORY: History  Substance Use Topics  . Smoking status: Never Smoker   . Smokeless tobacco: Never Used  . Alcohol Use: Yes     Comment: occasional   Allergies  Allergen Reactions  . Doxycycline Itching  . Peroxyl [Hydrogen Peroxide] Hives and Rash   Current Outpatient Prescriptions  Medication Sig Dispense Refill  . Acetaminophen (TYLENOL PO) Take 1-2 tablets by mouth See admin instructions. Uses at dialysis Mon, Wed, Fridays each week for muscle spasms      . amLODipine (NORVASC) 10 MG tablet Take 0.5 tablets (5 mg total) by mouth at bedtime.  30 tablet  1  . aspirin 81 MG tablet Take 81 mg by mouth daily.      Marland Kitchen CALCIUM-VITAMIN D PO Take 1 tablet by mouth 3 (three) times daily with meals.      . Clopidogrel Bisulfate (PLAVIX PO) Take 75 mg by mouth daily.       . diphenhydrAMINE (BENADRYL) 25 MG tablet Take 25 mg by mouth every 6 (six) hours as needed (Muscle spasm in feet).      Marland Kitchen esomeprazole (NEXIUM) 40 MG capsule Take 40 mg by mouth daily as needed (acid reflux).       . Gauze Pads & Dressings (TELFA NON-ADHERENT DESSING) 2"X3" PADS 1 Units by Does not apply route daily.  100 each  1  . glipiZIDE (GLUCOTROL)  10 MG tablet Take 5 mg by mouth daily.       Marland Kitchen HYDROcodone-acetaminophen (NORCO/VICODIN) 5-325 MG per tablet Take 1 tablet by mouth every 6 (six) hours as needed for moderate pain.       Marland Kitchen HYDROcodone-acetaminophen (NORCO/VICODIN) 5-325 MG per tablet 1 to 2 tabs every 4 to 6 hours as needed for pain.  20 tablet  0  . Multiple Vitamin (MULTIVITAMIN WITH MINERALS) TABS tablet Take 1 tablet by mouth daily.      . multivitamin (RENA-VIT) TABS tablet Take 1 tablet by mouth daily.      . polyethylene glycol (MIRALAX / GLYCOLAX) packet Take 17 g by mouth daily as needed for mild constipation.      . traMADol (ULTRAM) 50 MG tablet Take 50 mg by mouth every 6 (six) hours as needed for moderate pain.       Marland Kitchen VITAMIN E PO Take 1 tablet by mouth daily as  needed (Healing of foot).      . cephALEXin (KEFLEX) 500 MG capsule Take 1 capsule (500 mg total) by mouth daily.  10 capsule  0  . neomycin-bacitracin-polymyxin (NEOSPORIN) [phone] ointment Apply 1 application topically daily. Apply to foot       No current facility-administered medications for this visit.   REVIEW OF SYSTEMS: Valu.Nieves ] denotes positive finding; [  ] denotes negative finding  CARDIOVASCULAR:  [ ]  chest pain   [ ]  chest pressure   [ ]  palpitations   [ ]  orthopnea   [ ]  dyspnea on exertion   [ ]  claudication   [ ]  rest pain   [ ]  DVT   [ ]  phlebitis PULMONARY:   [ ]  productive cough   [ ]  asthma   [ ]  wheezing NEUROLOGIC:   [ ]  weakness  [ ]  paresthesias  [ ]  aphasia  [ ]  amaurosis  [ ]  dizziness HEMATOLOGIC:   [ ]  bleeding problems   [ ]  clotting disorders MUSCULOSKELETAL:  [ ]  joint pain   [ ]  joint swelling [ ]  leg swelling GASTROINTESTINAL: [ ]   blood in stool  [ ]   hematemesis GENITOURINARY:  [ ]   dysuria  [ ]   hematuria PSYCHIATRIC:  [ ]  history of major depression INTEGUMENTARY:  [ ]  rashes  Valu.Nieves ] ulcers CONSTITUTIONAL:  [ ]  fever   [ ]  chills  PHYSICAL EXAM: Filed Vitals:   12/10/13 1558  BP: 179/87  Pulse: 99  Resp: 16  Height: 5\' 2"  (1.575 m)  Weight: 165 lb (74.844 kg)   Body mass index is 30.17 kg/(m^2). GENERAL: The patient is a well-nourished female, in no acute distress. The vital signs are documented above. CARDIOVASCULAR: There is a regular rate and rhythm. She has palpable femoral pulses. She has a brisk anterior tibial and dorsalis pedis signal on the left with the Doppler with a markedly dampened monophasic posterior tibial signal with the Doppler. PULMONARY: There is good air exchange bilaterally without wheezing or rales. ABDOMEN: Soft and non-tender with normal pitched bowel sounds.  MUSCULOSKELETAL: she has a left transmetatarsal amputation. NEUROLOGIC: No focal weakness or paresthesias are detected. SKIN: There is a full thickness wound on the  left heel in an open wound on the lateral aspect of her transmetatarsal amputation with poor granulation tissue. PSYCHIATRIC: The patient has a normal affect.  DATA:  I have reviewed her arteriogram as described above.  MEDICAL ISSUES:  Atherosclerosis of native arteries of the extremities with gangrene This patient has dry gangrene  of the left heel and also an open ulcer on the lateral aspect of her left transmetatarsal amputation. I have reviewed her arteriogram that was done in [facility] last month. 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 She underwent PTA of the posterior tibial artery however this artery is occluded distally so there are really no further options for revascularization. I would recommend continued aggressive wound care by the wound care center. If this fails to heal unfortunately she would require a below the knee amputation. However I explained to her that I did not see any other options for revascularization on the left.    Return in about 6 months (around 06/12/2014).  [facility] [place]: [phone]

## 2013-12-17 DIAGNOSIS — L97409 Non-pressure chronic ulcer of unspecified heel and midfoot with unspecified severity: Secondary | ICD-10-CM | POA: Diagnosis not present

## 2013-12-17 DIAGNOSIS — S98919A Complete traumatic amputation of unspecified foot, level unspecified, initial encounter: Secondary | ICD-10-CM | POA: Diagnosis not present

## 2013-12-17 DIAGNOSIS — L97509 Non-pressure chronic ulcer of other part of unspecified foot with unspecified severity: Secondary | ICD-10-CM | POA: Diagnosis not present

## 2013-12-17 DIAGNOSIS — E1169 Type 2 diabetes mellitus with other specified complication: Secondary | ICD-10-CM | POA: Diagnosis not present

## 2013-12-17 LAB — GLUCOSE, CAPILLARY: Glucose-Capillary: 226 mg/dL — ABNORMAL HIGH (ref 70–99)

## 2013-12-21 DIAGNOSIS — N186 End stage renal disease: Secondary | ICD-10-CM | POA: Diagnosis not present

## 2013-12-22 DIAGNOSIS — Z992 Dependence on renal dialysis: Secondary | ICD-10-CM | POA: Diagnosis not present

## 2013-12-22 DIAGNOSIS — N186 End stage renal disease: Secondary | ICD-10-CM | POA: Diagnosis not present

## 2013-12-22 DIAGNOSIS — E119 Type 2 diabetes mellitus without complications: Secondary | ICD-10-CM | POA: Diagnosis not present

## 2013-12-22 DIAGNOSIS — L89620 Pressure ulcer of left heel, unstageable: Secondary | ICD-10-CM | POA: Diagnosis not present

## 2013-12-22 DIAGNOSIS — S91302D Unspecified open wound, left foot, subsequent encounter: Secondary | ICD-10-CM | POA: Diagnosis not present

## 2013-12-22 DIAGNOSIS — I120 Hypertensive chronic kidney disease with stage 5 chronic kidney disease or end stage renal disease: Secondary | ICD-10-CM | POA: Diagnosis not present

## 2013-12-23 DIAGNOSIS — N186 End stage renal disease: Secondary | ICD-10-CM | POA: Diagnosis not present

## 2013-12-23 DIAGNOSIS — D631 Anemia in chronic kidney disease: Secondary | ICD-10-CM | POA: Diagnosis not present

## 2013-12-23 DIAGNOSIS — E1129 Type 2 diabetes mellitus with other diabetic kidney complication: Secondary | ICD-10-CM | POA: Diagnosis not present

## 2013-12-23 DIAGNOSIS — N039 Chronic nephritic syndrome with unspecified morphologic changes: Secondary | ICD-10-CM | POA: Diagnosis not present

## 2013-12-23 DIAGNOSIS — E1139 Type 2 diabetes mellitus with other diabetic ophthalmic complication: Secondary | ICD-10-CM | POA: Diagnosis not present

## 2013-12-24 DIAGNOSIS — Z992 Dependence on renal dialysis: Secondary | ICD-10-CM | POA: Diagnosis not present

## 2013-12-24 DIAGNOSIS — I120 Hypertensive chronic kidney disease with stage 5 chronic kidney disease or end stage renal disease: Secondary | ICD-10-CM | POA: Diagnosis not present

## 2013-12-24 DIAGNOSIS — L89620 Pressure ulcer of left heel, unstageable: Secondary | ICD-10-CM | POA: Diagnosis not present

## 2013-12-24 DIAGNOSIS — N186 End stage renal disease: Secondary | ICD-10-CM | POA: Diagnosis not present

## 2013-12-24 DIAGNOSIS — S91302D Unspecified open wound, left foot, subsequent encounter: Secondary | ICD-10-CM | POA: Diagnosis not present

## 2013-12-24 DIAGNOSIS — E119 Type 2 diabetes mellitus without complications: Secondary | ICD-10-CM | POA: Diagnosis not present

## 2013-12-26 DIAGNOSIS — L89620 Pressure ulcer of left heel, unstageable: Secondary | ICD-10-CM | POA: Diagnosis not present

## 2013-12-26 DIAGNOSIS — I120 Hypertensive chronic kidney disease with stage 5 chronic kidney disease or end stage renal disease: Secondary | ICD-10-CM | POA: Diagnosis not present

## 2013-12-26 DIAGNOSIS — Z992 Dependence on renal dialysis: Secondary | ICD-10-CM | POA: Diagnosis not present

## 2013-12-26 DIAGNOSIS — N186 End stage renal disease: Secondary | ICD-10-CM | POA: Diagnosis not present

## 2013-12-26 DIAGNOSIS — E119 Type 2 diabetes mellitus without complications: Secondary | ICD-10-CM | POA: Diagnosis not present

## 2013-12-26 DIAGNOSIS — S91302D Unspecified open wound, left foot, subsequent encounter: Secondary | ICD-10-CM | POA: Diagnosis not present

## 2013-12-29 ENCOUNTER — Other Ambulatory Visit: Payer: Self-pay

## 2013-12-29 DIAGNOSIS — Z1231 Encounter for screening mammogram for malignant neoplasm of breast: Secondary | ICD-10-CM

## 2013-12-31 ENCOUNTER — Encounter (HOSPITAL_BASED_OUTPATIENT_CLINIC_OR_DEPARTMENT_OTHER): Payer: Medicare Other | Attending: Internal Medicine

## 2013-12-31 DIAGNOSIS — I120 Hypertensive chronic kidney disease with stage 5 chronic kidney disease or end stage renal disease: Secondary | ICD-10-CM | POA: Diagnosis not present

## 2013-12-31 DIAGNOSIS — N186 End stage renal disease: Secondary | ICD-10-CM | POA: Diagnosis not present

## 2013-12-31 DIAGNOSIS — E119 Type 2 diabetes mellitus without complications: Secondary | ICD-10-CM | POA: Diagnosis not present

## 2013-12-31 DIAGNOSIS — L89620 Pressure ulcer of left heel, unstageable: Secondary | ICD-10-CM | POA: Diagnosis not present

## 2013-12-31 DIAGNOSIS — S91302D Unspecified open wound, left foot, subsequent encounter: Secondary | ICD-10-CM | POA: Diagnosis not present

## 2013-12-31 DIAGNOSIS — Z992 Dependence on renal dialysis: Secondary | ICD-10-CM | POA: Diagnosis not present

## 2014-01-02 DIAGNOSIS — S91302D Unspecified open wound, left foot, subsequent encounter: Secondary | ICD-10-CM | POA: Diagnosis not present

## 2014-01-02 DIAGNOSIS — Z992 Dependence on renal dialysis: Secondary | ICD-10-CM | POA: Diagnosis not present

## 2014-01-02 DIAGNOSIS — E119 Type 2 diabetes mellitus without complications: Secondary | ICD-10-CM | POA: Diagnosis not present

## 2014-01-02 DIAGNOSIS — L89620 Pressure ulcer of left heel, unstageable: Secondary | ICD-10-CM | POA: Diagnosis not present

## 2014-01-02 DIAGNOSIS — N186 End stage renal disease: Secondary | ICD-10-CM | POA: Diagnosis not present

## 2014-01-02 DIAGNOSIS — I120 Hypertensive chronic kidney disease with stage 5 chronic kidney disease or end stage renal disease: Secondary | ICD-10-CM | POA: Diagnosis not present

## 2014-01-04 ENCOUNTER — Emergency Department (INDEPENDENT_AMBULATORY_CARE_PROVIDER_SITE_OTHER)
Admission: EM | Admit: 2014-01-04 | Discharge: 2014-01-04 | Disposition: A | Discharge status: Home / Self Care | Payer: Medicare Other | Source: Home / Self Care | Attending: Family Medicine | Admitting: Family Medicine

## 2014-01-04 ENCOUNTER — Emergency Department (INDEPENDENT_AMBULATORY_CARE_PROVIDER_SITE_OTHER): Payer: Medicare Other

## 2014-01-04 ENCOUNTER — Encounter (HOSPITAL_COMMUNITY): Payer: Self-pay | Admitting: Emergency Medicine

## 2014-01-04 DIAGNOSIS — E1159 Type 2 diabetes mellitus with other circulatory complications: Secondary | ICD-10-CM | POA: Diagnosis not present

## 2014-01-04 DIAGNOSIS — E1169 Type 2 diabetes mellitus with other specified complication: Secondary | ICD-10-CM

## 2014-01-04 DIAGNOSIS — L089 Local infection of the skin and subcutaneous tissue, unspecified: Secondary | ICD-10-CM

## 2014-01-04 DIAGNOSIS — I798 Other disorders of arteries, arterioles and capillaries in diseases classified elsewhere: Secondary | ICD-10-CM

## 2014-01-04 DIAGNOSIS — L97509 Non-pressure chronic ulcer of other part of unspecified foot with unspecified severity: Secondary | ICD-10-CM | POA: Diagnosis not present

## 2014-01-04 DIAGNOSIS — I739 Peripheral vascular disease, unspecified: Secondary | ICD-10-CM | POA: Diagnosis not present

## 2014-01-04 DIAGNOSIS — E11628 Type 2 diabetes mellitus with other skin complications: Secondary | ICD-10-CM

## 2014-01-04 MED ORDER — CLINDAMYCIN HCL 300 MG PO CAPS: 300.0000 mg | ORAL_CAPSULE | Freq: Three times a day (TID) | ORAL | Status: DC

## 2014-01-04 MED ORDER — CIPROFLOXACIN HCL 500 MG PO TABS
500.0000 mg | ORAL_TABLET | Freq: Every day | ORAL | Status: DC
Start: 2014-01-04 — End: 2014-01-04

## 2014-01-04 MED ORDER — CIPROFLOXACIN HCL 500 MG PO TABS: 500.0000 mg | ORAL_TABLET | Freq: Every day | ORAL | Status: DC

## 2014-01-04 MED ORDER — SILVER SULFADIAZINE 1 % EX CREA
TOPICAL_CREAM | CUTANEOUS | Status: AC
  Filled 2014-01-04: qty 85

## 2014-01-04 NOTE — ED Notes (Signed)
Applied silvadene to wound and wrapped in 2 in kling and 4 in coban.

## 2014-01-04 NOTE — ED Provider Notes (Signed)
Medical screening examination/treatment/procedure(s) were performed by resident physician or non-physician practitioner and as supervising physician I was immediately available for consultation/collaboration.   Pauline Good MD.   Billy Fischer, MD 01/04/14 [phone]

## 2014-01-04 NOTE — ED Notes (Addendum)
Reports cut to left foot; hx of foot amputation x2 months ago?? home health nurse reports poss inf w/foul odor Brought back in walker/wheelchair Pt is alert, no signs of acute distress.

## 2014-01-04 NOTE — Discharge Instructions (Signed)
Diabetes and Foot Care Diabetes may cause you to have problems because of poor blood supply (circulation) to your feet and legs. This may cause the skin on your feet to become thinner, break easier, and heal more slowly. Your skin may become dry, and the skin may peel and crack. You may also have nerve damage in your legs and feet causing decreased feeling in them. You may not notice minor injuries to your feet that could lead to infections or more serious problems. Taking care of your feet is one of the most important things you can do for yourself.  HOME CARE INSTRUCTIONS  Wear shoes at all times, even in the house. Do not go barefoot. Bare feet are easily injured.  Check your feet daily for blisters, cuts, and redness. If you cannot see the bottom of your feet, use a mirror or ask someone for help.  Wash your feet with warm water (do not use hot water) and mild soap. Then pat your feet and the areas between your toes until they are completely dry. Do not soak your feet as this can dry your skin.  Apply a moisturizing lotion or petroleum jelly (that does not contain alcohol and is unscented) to the skin on your feet and to dry, brittle toenails. Do not apply lotion between your toes.  Trim your toenails straight across. Do not dig under them or around the cuticle. File the edges of your nails with an emery board or nail file.  Do not cut corns or calluses or try to remove them with medicine.  Wear clean socks or stockings every day. Make sure they are not too tight. Do not wear knee-high stockings since they may decrease blood flow to your legs.  Wear shoes that fit properly and have enough cushioning. To break in new shoes, wear them for just a few hours a day. This prevents you from injuring your feet. Always look in your shoes before you put them on to be sure there are no objects inside.  Do not cross your legs. This may decrease the blood flow to your feet.  If you find a minor scrape,  cut, or break in the skin on your feet, keep it and the skin around it clean and dry. These areas may be cleansed with mild soap and water. Do not cleanse the area with peroxide, alcohol, or iodine.  When you remove an adhesive bandage, be sure not to damage the skin around it.  If you have a wound, look at it several times a day to make sure it is healing.  Do not use heating pads or hot water bottles. They may burn your skin. If you have lost feeling in your feet or legs, you may not know it is happening until it is too late.  Make sure your health care provider performs a complete foot exam at least annually or more often if you have foot problems. Report any cuts, sores, or bruises to your health care provider immediately. SEEK MEDICAL CARE IF:   You have an injury that is not healing.  You have cuts or breaks in the skin.  You have an ingrown nail.  You notice redness on your legs or feet.  You feel burning or tingling in your legs or feet.  You have pain or cramps in your legs and feet.  Your legs or feet are numb.  Your feet always feel cold. SEEK IMMEDIATE MEDICAL CARE IF:   There is increasing redness,   swelling, or pain in or around a wound.  There is a red line that goes up your leg.  Pus is coming from a wound.  You develop a fever or as directed by your health care provider.  You notice a bad smell coming from an ulcer or wound. Document Released: 04/06/2000 Document Revised: 12/10/2012 Document Reviewed: 09/16/2012 Exit[employer] Patient Information 2015 [employer]. This information is not intended to replace advice given to you by your health care provider. Make sure you discuss any questions you have with your health care provider.  

## 2014-01-04 NOTE — ED Provider Notes (Signed)
CSN: [id number]     Arrival date & time 01/04/14  1715 History   None    Chief Complaint  Patient presents with  . Foot Problem   (Consider location/radiation/quality/duration/timing/severity/associated sxs/prior Treatment) HPI   53 year old female with a history of end-stage renal disease, type 2 diabetes, peripheral vascular disease with gangrene, status post left midfoot amputation 3 months ago presents complaining of possible infection and ulcer that is on her she'll and also at the end of her foot. She is followed by home health nurse to do dressing changes and wound care, the home health nurse told her to come up here because they thought the wound was infected. She sees her wound care physician every Thursday but the home health nurse told her that could not wait that long. She had one episode of vomiting this morning but no longer feels nauseated. No systemic symptoms at this time. She is having pain in the foot. She also has foul-smelling drainage.  Past Medical History  Diagnosis Date  . ESRD (end stage renal disease)   . Hypertension   . CVA (cerebral infarction)     right parietal 05/19/2000  . Vaginal bleeding   . Hyperparathyroidism   . Pneumonia   . Stroke     2002,no residual  . Peripheral vascular disease   . GERD (gastroesophageal reflux disease)   . Anemia   . DM (diabetes mellitus) type II controlled peripheral vascular disorder   . Slip/trip w/o falling due to stepping on object, subs July  2014   Past Surgical History  Procedure Laterality Date  . Parathyroidectomy with autotransplant  12/07/2010  . Knee surgery      right, x2  . Brachial artery graft      x2 for dialysis  . Av fistula placement      upper rt thigh  . Dental extractions Bilateral   . Dilation and curettage of uterus      hx: of 1986  . Transmetatarsal amputation Left 07/15/2012    Procedure: TRANSMETATARSAL AMPUTATION;  Surgeon: Angelia Mould, MD;  Location: [facility] OR;  Service:  Vascular;  Laterality: Left;  . Leg surgery Right   . Foot surgery Left     5 TOES AMPUTATED   Family History  Problem Relation Age of Onset  . Hypertension Father   . Kidney disease Mother    History  Substance Use Topics  . Smoking status: Never Smoker   . Smokeless tobacco: Never Used  . Alcohol Use: Yes     Comment: occasional   OB History   Grav Para Term Preterm Abortions TAB SAB Ect Mult Living                 Review of Systems  Gastrointestinal: Positive for vomiting. Negative for nausea and abdominal pain.  Skin: Positive for wound (see HPI).  Neurological: Negative for numbness.  All other systems reviewed and are negative.   Allergies  Doxycycline and Peroxyl  Home Medications   Prior to Admission medications   Medication Sig Start Date End Date Taking? Authorizing Provider  amLODipine (NORVASC) 10 MG tablet Take 0.5 tablets (5 mg total) by mouth at bedtime. 10/20/13  Yes Lance Bosch, NP  aspirin 81 MG tablet Take 81 mg by mouth daily.   Yes Historical Provider, MD  glipiZIDE (GLUCOTROL) 10 MG tablet Take 5 mg by mouth daily.    Yes Historical Provider, MD  Acetaminophen (TYLENOL PO) Take 1-2 tablets by mouth See admin instructions.  Uses at dialysis Mon, Wed, Fridays each week for muscle spasms    Historical Provider, MD  CALCIUM-VITAMIN D PO Take 1 tablet by mouth 3 (three) times daily with meals.    Historical Provider, MD  cephALEXin (KEFLEX) 500 MG capsule Take 1 capsule (500 mg total) by mouth daily. 10/20/13   Lance Bosch, NP  ciprofloxacin (CIPRO) 500 MG tablet Take 1 tablet (500 mg total) by mouth daily. After dialysis 01/04/14   Liam Graham, PA-C  clindamycin (CLEOCIN) 300 MG capsule Take 1 capsule (300 mg total) by mouth 3 (three) times daily. 01/04/14   Liam Graham, PA-C  Clopidogrel Bisulfate (PLAVIX PO) Take 75 mg by mouth daily.     Historical Provider, MD  diphenhydrAMINE (BENADRYL) 25 MG tablet Take 25 mg by mouth every 6 (six) hours  as needed (Muscle spasm in feet).    Historical Provider, MD  esomeprazole (NEXIUM) 40 MG capsule Take 40 mg by mouth daily as needed (acid reflux).     Historical Provider, MD  Gauze Pads & Dressings (TELFA NON-ADHERENT DESSING) 2"X3" PADS 1 Units by Does not apply route daily. 12/09/12   Liam Graham, PA-C  HYDROcodone-acetaminophen (NORCO/VICODIN) 5-325 MG per tablet Take 1 tablet by mouth every 6 (six) hours as needed for moderate pain.  08/26/13   Historical Provider, MD  HYDROcodone-acetaminophen (NORCO/VICODIN) 5-325 MG per tablet 1 to 2 tabs every 4 to 6 hours as needed for pain. 10/29/13   Harden Mo, MD  Multiple Vitamin (MULTIVITAMIN WITH MINERALS) TABS tablet Take 1 tablet by mouth daily.    Historical Provider, MD  multivitamin (RENA-VIT) TABS tablet Take 1 tablet by mouth daily.    Historical Provider, MD  neomycin-bacitracin-polymyxin (NEOSPORIN) [phone] ointment Apply 1 application topically daily. Apply to foot    Historical Provider, MD  polyethylene glycol (MIRALAX / GLYCOLAX) packet Take 17 g by mouth daily as needed for mild constipation.    Historical Provider, MD  traMADol (ULTRAM) 50 MG tablet Take 50 mg by mouth every 6 (six) hours as needed for moderate pain.  08/07/13   Historical Provider, MD  VITAMIN E PO Take 1 tablet by mouth daily as needed (Healing of foot).    Historical Provider, MD   BP 152/72  Pulse 98  Temp(Src) 98.3 F (36.8 C) (Oral)  Resp 16  SpO2 100% Physical Exam  Nursing note and vitals reviewed. Constitutional: She is oriented to person, place, and time. Vital signs are normal. She appears well-developed and well-nourished. No distress.  HENT:  Head: Normocephalic and atraumatic.  Cardiovascular: Normal rate, regular rhythm and normal heart sounds.   Pulmonary/Chest: Effort normal and breath sounds normal. No respiratory distress.  Musculoskeletal:       Left foot: She exhibits deformity (status post midfoot amputation).  There are wounds  on the medial heel in the distal lateral foot at the amputation site, both with foul-smelling drainage, both are tender  Neurological: She is alert and oriented to person, place, and time. She has normal strength. Coordination normal.  Skin: Skin is warm and dry. No rash noted. She is not diaphoretic.  Psychiatric: She has a normal mood and affect. Judgment normal.    ED Course  Procedures (including critical care time) Labs Review Labs Reviewed  CULTURE, ROUTINE-ABSCESS  CULTURE, ROUTINE-ABSCESS    Imaging Review Dg Foot Complete Left  01/04/2014   CLINICAL DATA:  Ulceration  EXAM: LEFT FOOT - COMPLETE 3+ VIEW  COMPARISON:  10/29/2013  FINDINGS:  There is amputation of the first through fifth ray at the mid metatarsal level. There is a soft tissue ulceration over the lateral aspect of the distal foot similar to comparison exam. While the margins of the osteotomies are slightly fuzzy compared to the comparison exam, no clear evidence of cortical erosion to suggest osteomyelitis.  IMPRESSION: No clear evidence of osteomyelitis.  Soft tissue ulceration of the distal foot.   Electronically Signed   By: Suzy Bouchard M.D.   On: 01/04/2014 18:15     MDM   1. Diabetic foot infection   2. PAD (peripheral artery disease)    Case discussed with attending. We'll treat with Cipro and clindamycin. Followup with wound care physician as scheduled on Thursday. ED if worsening.   Meds ordered this encounter  Medications  . DISCONTD: clindamycin (CLEOCIN) 300 MG capsule    Sig: Take 1 capsule (300 mg total) by mouth 3 (three) times daily.    Dispense:  42 capsule    Refill:  0    Order Specific Question:  Supervising Provider    Answer:  Jake Michaelis, DAVID C [id number]  . DISCONTD: ciprofloxacin (CIPRO) 500 MG tablet    Sig: Take 1 tablet (500 mg total) by mouth daily. After dialysis    Dispense:  14 tablet    Refill:  0    Order Specific Question:  Supervising Provider    Answer:  Jake Michaelis, DAVID C  [id number]  . ciprofloxacin (CIPRO) 500 MG tablet    Sig: Take 1 tablet (500 mg total) by mouth daily. After dialysis    Dispense:  14 tablet    Refill:  0    Order Specific Question:  Supervising Provider    Answer:  Jake Michaelis, DAVID C [id number]  . clindamycin (CLEOCIN) 300 MG capsule    Sig: Take 1 capsule (300 mg total) by mouth 3 (three) times daily.    Dispense:  42 capsule    Refill:  0    Order Specific Question:  Supervising Provider    Answer:  Jake Michaelis, DAVID C [[id number]]      Liam Graham, PA-C 01/04/14 [phone]

## 2014-01-05 DIAGNOSIS — S91302D Unspecified open wound, left foot, subsequent encounter: Secondary | ICD-10-CM | POA: Diagnosis not present

## 2014-01-05 DIAGNOSIS — L89620 Pressure ulcer of left heel, unstageable: Secondary | ICD-10-CM | POA: Diagnosis not present

## 2014-01-05 DIAGNOSIS — I120 Hypertensive chronic kidney disease with stage 5 chronic kidney disease or end stage renal disease: Secondary | ICD-10-CM | POA: Diagnosis not present

## 2014-01-05 DIAGNOSIS — Z992 Dependence on renal dialysis: Secondary | ICD-10-CM | POA: Diagnosis not present

## 2014-01-05 DIAGNOSIS — E119 Type 2 diabetes mellitus without complications: Secondary | ICD-10-CM | POA: Diagnosis not present

## 2014-01-05 DIAGNOSIS — N186 End stage renal disease: Secondary | ICD-10-CM | POA: Diagnosis not present

## 2014-01-07 ENCOUNTER — Ambulatory Visit (INDEPENDENT_AMBULATORY_CARE_PROVIDER_SITE_OTHER): Payer: Medicare Other | Admitting: Podiatrist

## 2014-01-07 ENCOUNTER — Encounter: Payer: Self-pay | Admitting: Podiatrist

## 2014-01-07 VITALS — BP 154/86 | HR 62 | Resp 12

## 2014-01-07 DIAGNOSIS — L89620 Pressure ulcer of left heel, unstageable: Secondary | ICD-10-CM | POA: Diagnosis not present

## 2014-01-07 DIAGNOSIS — S98919A Complete traumatic amputation of unspecified foot, level unspecified, initial encounter: Secondary | ICD-10-CM

## 2014-01-07 DIAGNOSIS — L97409 Non-pressure chronic ulcer of unspecified heel and midfoot with unspecified severity: Secondary | ICD-10-CM | POA: Diagnosis not present

## 2014-01-07 DIAGNOSIS — E119 Type 2 diabetes mellitus without complications: Secondary | ICD-10-CM | POA: Diagnosis not present

## 2014-01-07 DIAGNOSIS — Z992 Dependence on renal dialysis: Secondary | ICD-10-CM | POA: Diagnosis not present

## 2014-01-07 DIAGNOSIS — I739 Peripheral vascular disease, unspecified: Secondary | ICD-10-CM

## 2014-01-07 DIAGNOSIS — I70269 Atherosclerosis of native arteries of extremities with gangrene, unspecified extremity: Secondary | ICD-10-CM

## 2014-01-07 DIAGNOSIS — S91302D Unspecified open wound, left foot, subsequent encounter: Secondary | ICD-10-CM | POA: Diagnosis not present

## 2014-01-07 DIAGNOSIS — L97422 Non-pressure chronic ulcer of left heel and midfoot with fat layer exposed: Secondary | ICD-10-CM

## 2014-01-07 DIAGNOSIS — Z89432 Acquired absence of left foot: Secondary | ICD-10-CM

## 2014-01-07 DIAGNOSIS — N186 End stage renal disease: Secondary | ICD-10-CM | POA: Diagnosis not present

## 2014-01-07 DIAGNOSIS — I120 Hypertensive chronic kidney disease with stage 5 chronic kidney disease or end stage renal disease: Secondary | ICD-10-CM | POA: Diagnosis not present

## 2014-01-07 LAB — CULTURE, ROUTINE-ABSCESS: Gram Stain: NONE SEEN

## 2014-01-07 MED ORDER — MUPIROCIN 2 % EX OINT: 1.0000 "application " | TOPICAL_OINTMENT | Freq: Every day | CUTANEOUS | Status: DC

## 2014-01-07 NOTE — Patient Instructions (Signed)
Mupirocin is a wound healing ointment and antibiotic that has been called into your pharmacy-- use this on the wound until you can see your wound care doctor

## 2014-01-07 NOTE — ED Notes (Signed)
Abscess culture L foot: Abundant Group B strep (S. Agalactiae).  Treated with Cipro and Cleocin.   Lab shown to Dr. Jake Michaelis. He said tx. Adequate with Cleocin.  Abscess culture L heel: Multiple organisms present none predominant.

## 2014-01-07 NOTE — Progress Notes (Signed)
   Subjective: Patient presents today for followup of ulceration distal tip of transmetatarsal amputation. She also has dry eschar on the left heel which is being watched closely. She is being seen at the wound care center. She recently went to the urgent care center with an episode of pain and drainage to the distal amputation site. She was put on ciprofloxacin and clindamycin. She has home health care Tuesdays and Saturdays and they have been changing the dressing. She states she has been using santyl on the wound up until recently when she switched to neomycin. She denies any systemic signs of infection.  Objective: Continued nonpalpable pedal pulses are noted left. Dry eschar is present with some thin flaky skin on the plantar aspect of left heel which is left undisturbed. She has an ulcer on the distal lateral aspect of the transmetatarsal amputation site measuring 3 cm x 2 cm with a fibrous base. No malodor, no drainage, no active pus or pirulence is seen. No streaking or lymphangitis is noted no calor is present. No tunneling, no probing to bone is noted.  Assessment: PVD, nonhealing wound distal transmetatarsal amputation site left, dry gangrene left heel  Plan: Cleansed the wound with wound wash and applied Silvadene cream and a dressing. Prescription for mupirocin ointment to use until she gets to see the wound care physician's in a week. She is to continue taking her antibiotics as instructed. I discussed with her the distinct possibility that she will require a below-knee amputation due to the poor arterial status of her foot. She will followup with me on an as-needed basis.

## 2014-01-09 DIAGNOSIS — L89620 Pressure ulcer of left heel, unstageable: Secondary | ICD-10-CM | POA: Diagnosis not present

## 2014-01-09 DIAGNOSIS — Z992 Dependence on renal dialysis: Secondary | ICD-10-CM | POA: Diagnosis not present

## 2014-01-09 DIAGNOSIS — E119 Type 2 diabetes mellitus without complications: Secondary | ICD-10-CM | POA: Diagnosis not present

## 2014-01-09 DIAGNOSIS — S91302D Unspecified open wound, left foot, subsequent encounter: Secondary | ICD-10-CM | POA: Diagnosis not present

## 2014-01-09 DIAGNOSIS — N186 End stage renal disease: Secondary | ICD-10-CM | POA: Diagnosis not present

## 2014-01-09 DIAGNOSIS — I120 Hypertensive chronic kidney disease with stage 5 chronic kidney disease or end stage renal disease: Secondary | ICD-10-CM | POA: Diagnosis not present

## 2014-01-12 ENCOUNTER — Ambulatory Visit
Admission: RE | Admit: 2014-01-12 | Discharge: 2014-01-12 | Disposition: A | Discharge status: Home / Self Care | Payer: Medicare Other | Source: Ambulatory Visit

## 2014-01-12 ENCOUNTER — Ambulatory Visit: Payer: Medicare Other

## 2014-01-12 DIAGNOSIS — Z1231 Encounter for screening mammogram for malignant neoplasm of breast: Secondary | ICD-10-CM

## 2014-01-12 DIAGNOSIS — E119 Type 2 diabetes mellitus without complications: Secondary | ICD-10-CM | POA: Diagnosis not present

## 2014-01-12 DIAGNOSIS — Z992 Dependence on renal dialysis: Secondary | ICD-10-CM | POA: Diagnosis not present

## 2014-01-12 DIAGNOSIS — I120 Hypertensive chronic kidney disease with stage 5 chronic kidney disease or end stage renal disease: Secondary | ICD-10-CM | POA: Diagnosis not present

## 2014-01-12 DIAGNOSIS — L89620 Pressure ulcer of left heel, unstageable: Secondary | ICD-10-CM | POA: Diagnosis not present

## 2014-01-12 DIAGNOSIS — N186 End stage renal disease: Secondary | ICD-10-CM | POA: Diagnosis not present

## 2014-01-12 DIAGNOSIS — S91302D Unspecified open wound, left foot, subsequent encounter: Secondary | ICD-10-CM | POA: Diagnosis not present

## 2014-01-14 DIAGNOSIS — I120 Hypertensive chronic kidney disease with stage 5 chronic kidney disease or end stage renal disease: Secondary | ICD-10-CM | POA: Diagnosis not present

## 2014-01-14 DIAGNOSIS — S91302D Unspecified open wound, left foot, subsequent encounter: Secondary | ICD-10-CM | POA: Diagnosis not present

## 2014-01-14 DIAGNOSIS — E119 Type 2 diabetes mellitus without complications: Secondary | ICD-10-CM | POA: Diagnosis not present

## 2014-01-14 DIAGNOSIS — N186 End stage renal disease: Secondary | ICD-10-CM | POA: Diagnosis not present

## 2014-01-14 DIAGNOSIS — L89620 Pressure ulcer of left heel, unstageable: Secondary | ICD-10-CM | POA: Diagnosis not present

## 2014-01-14 DIAGNOSIS — Z992 Dependence on renal dialysis: Secondary | ICD-10-CM | POA: Diagnosis not present

## 2014-01-16 DIAGNOSIS — L89620 Pressure ulcer of left heel, unstageable: Secondary | ICD-10-CM | POA: Diagnosis not present

## 2014-01-16 DIAGNOSIS — S91302D Unspecified open wound, left foot, subsequent encounter: Secondary | ICD-10-CM | POA: Diagnosis not present

## 2014-01-16 DIAGNOSIS — E119 Type 2 diabetes mellitus without complications: Secondary | ICD-10-CM | POA: Diagnosis not present

## 2014-01-16 DIAGNOSIS — I120 Hypertensive chronic kidney disease with stage 5 chronic kidney disease or end stage renal disease: Secondary | ICD-10-CM | POA: Diagnosis not present

## 2014-01-16 DIAGNOSIS — N186 End stage renal disease: Secondary | ICD-10-CM | POA: Diagnosis not present

## 2014-01-16 DIAGNOSIS — Z992 Dependence on renal dialysis: Secondary | ICD-10-CM | POA: Diagnosis not present

## 2014-01-19 ENCOUNTER — Ambulatory Visit: Payer: Medicare Other | Attending: Internal Medicine | Admitting: Internal Medicine

## 2014-01-19 ENCOUNTER — Encounter: Payer: Self-pay | Admitting: Internal Medicine

## 2014-01-19 VITALS — BP 137/78 | HR 98 | Temp 98.5°F | Resp 14

## 2014-01-19 DIAGNOSIS — Z89429 Acquired absence of other toe(s), unspecified side: Secondary | ICD-10-CM | POA: Insufficient documentation

## 2014-01-19 DIAGNOSIS — Z23 Encounter for immunization: Secondary | ICD-10-CM | POA: Diagnosis not present

## 2014-01-19 DIAGNOSIS — Z8673 Personal history of transient ischemic attack (TIA), and cerebral infarction without residual deficits: Secondary | ICD-10-CM | POA: Diagnosis not present

## 2014-01-19 DIAGNOSIS — I120 Hypertensive chronic kidney disease with stage 5 chronic kidney disease or end stage renal disease: Secondary | ICD-10-CM | POA: Diagnosis not present

## 2014-01-19 DIAGNOSIS — I70269 Atherosclerosis of native arteries of extremities with gangrene, unspecified extremity: Secondary | ICD-10-CM

## 2014-01-19 DIAGNOSIS — E1169 Type 2 diabetes mellitus with other specified complication: Secondary | ICD-10-CM | POA: Diagnosis not present

## 2014-01-19 DIAGNOSIS — Z7982 Long term (current) use of aspirin: Secondary | ICD-10-CM | POA: Diagnosis not present

## 2014-01-19 DIAGNOSIS — Z992 Dependence on renal dialysis: Secondary | ICD-10-CM | POA: Diagnosis not present

## 2014-01-19 DIAGNOSIS — S98139A Complete traumatic amputation of one unspecified lesser toe, initial encounter: Secondary | ICD-10-CM

## 2014-01-19 DIAGNOSIS — E1159 Type 2 diabetes mellitus with other circulatory complications: Secondary | ICD-10-CM | POA: Diagnosis not present

## 2014-01-19 DIAGNOSIS — I10 Essential (primary) hypertension: Secondary | ICD-10-CM | POA: Diagnosis not present

## 2014-01-19 DIAGNOSIS — Z89422 Acquired absence of other left toe(s): Secondary | ICD-10-CM

## 2014-01-19 DIAGNOSIS — Z7902 Long term (current) use of antithrombotics/antiplatelets: Secondary | ICD-10-CM | POA: Diagnosis not present

## 2014-01-19 DIAGNOSIS — K219 Gastro-esophageal reflux disease without esophagitis: Secondary | ICD-10-CM | POA: Diagnosis not present

## 2014-01-19 DIAGNOSIS — Z79899 Other long term (current) drug therapy: Secondary | ICD-10-CM | POA: Insufficient documentation

## 2014-01-19 DIAGNOSIS — N186 End stage renal disease: Secondary | ICD-10-CM | POA: Diagnosis not present

## 2014-01-19 DIAGNOSIS — E1151 Type 2 diabetes mellitus with diabetic peripheral angiopathy without gangrene: Secondary | ICD-10-CM

## 2014-01-19 DIAGNOSIS — L97409 Non-pressure chronic ulcer of unspecified heel and midfoot with unspecified severity: Secondary | ICD-10-CM | POA: Diagnosis not present

## 2014-01-19 DIAGNOSIS — I798 Other disorders of arteries, arterioles and capillaries in diseases classified elsewhere: Secondary | ICD-10-CM | POA: Diagnosis not present

## 2014-01-19 LAB — GLUCOSE, POCT (MANUAL RESULT ENTRY): POC GLUCOSE: 153 mg/dL — AB (ref 70–99)

## 2014-01-19 LAB — POCT GLYCOSYLATED HEMOGLOBIN (HGB A1C): Hemoglobin A1C: 6.6

## 2014-01-19 MED ORDER — GLIPIZIDE 10 MG PO TABS: 5.0000 mg | ORAL_TABLET | Freq: Every day | ORAL | Status: DC

## 2014-01-19 MED ORDER — ASPIRIN 81 MG PO TABS: 81.0000 mg | ORAL_TABLET | Freq: Every day | ORAL | Status: DC

## 2014-01-19 MED ORDER — AMLODIPINE BESYLATE 10 MG PO TABS: 5.0000 mg | ORAL_TABLET | Freq: Every day | ORAL | Status: DC

## 2014-01-19 NOTE — Progress Notes (Signed)
Pt is here following up on her HTN and diabetes. Pt is still having pain in her left foot that was partially amputated. Pt has a big ulcer on her heel of her left foot.

## 2014-01-19 NOTE — Progress Notes (Signed)
Patient ID: [name], female   DOB: 05-Oct-1960, 53 y.o.   MRN: [id number]  CC: DM, HTN  HPI:  Patient presents to clinic today for a follow up of DM and HTN.  She reports that she often misses her medications and gets a craving for sweet foods.  She reports that she has continued pain in her left amputated foot.  She had a transmetatarsel amputation  around two years ago.  She states that the ulcer she had on the front of her foot was closed and scabbed over but she recently bumped it and it reopened.  It is currently draining yellow fluid and is on clindamycin and cipro as of now. She is due to follow up with wound care later this week for the necrotic ulcer on her heel.  She has a history of ESRD and is on dialysis on M,W,F.     Allergies  Allergen Reactions  . Doxycycline Itching  . Peroxyl [Hydrogen Peroxide] Hives and Rash   Past Medical History  Diagnosis Date  . ESRD (end stage renal disease)   . Hypertension   . CVA (cerebral infarction)     right parietal 05/19/2000  . Vaginal bleeding   . Hyperparathyroidism   . Pneumonia   . Stroke     2002,no residual  . Peripheral vascular disease   . GERD (gastroesophageal reflux disease)   . Anemia   . DM (diabetes mellitus) type II controlled peripheral vascular disorder   . Slip/trip w/o falling due to stepping on object, subs July  2014   Current Outpatient Prescriptions on File Prior to Visit  Medication Sig Dispense Refill  . Acetaminophen (TYLENOL PO) Take 1-2 tablets by mouth See admin instructions. Uses at dialysis Mon, Wed, Fridays each week for muscle spasms      . amLODipine (NORVASC) 10 MG tablet Take 0.5 tablets (5 mg total) by mouth at bedtime.  30 tablet  1  . aspirin 81 MG tablet Take 81 mg by mouth daily.      Marland Kitchen CALCIUM-VITAMIN D PO Take 1 tablet by mouth 3 (three) times daily with meals.      . ciprofloxacin (CIPRO) 500 MG tablet Take 1 tablet (500 mg total) by mouth daily. After dialysis  14 tablet  0  .  clindamycin (CLEOCIN) 300 MG capsule Take 1 capsule (300 mg total) by mouth 3 (three) times daily.  42 capsule  0  . Clopidogrel Bisulfate (PLAVIX PO) Take 75 mg by mouth daily.       . diphenhydrAMINE (BENADRYL) 25 MG tablet Take 25 mg by mouth every 6 (six) hours as needed (Muscle spasm in feet).      Marland Kitchen esomeprazole (NEXIUM) 40 MG capsule Take 40 mg by mouth daily as needed (acid reflux).       . Gauze Pads & Dressings (TELFA NON-ADHERENT DESSING) 2"X3" PADS 1 Units by Does not apply route daily.  100 each  1  . glipiZIDE (GLUCOTROL) 10 MG tablet Take 5 mg by mouth daily.       . Multiple Vitamin (MULTIVITAMIN WITH MINERALS) TABS tablet Take 1 tablet by mouth daily.      . multivitamin (RENA-VIT) TABS tablet Take 1 tablet by mouth daily.      . mupirocin ointment (BACTROBAN) 2 % Apply 1 application topically daily.  30 g  2  . polyethylene glycol (MIRALAX / GLYCOLAX) packet Take 17 g by mouth daily as needed for mild constipation.      Marland Kitchen  traMADol (ULTRAM) 50 MG tablet Take 50 mg by mouth every 6 (six) hours as needed for moderate pain.       . cephALEXin (KEFLEX) 500 MG capsule Take 1 capsule (500 mg total) by mouth daily.  10 capsule  0  . HYDROcodone-acetaminophen (NORCO/VICODIN) 5-325 MG per tablet Take 1 tablet by mouth every 6 (six) hours as needed for moderate pain.       Marland Kitchen HYDROcodone-acetaminophen (NORCO/VICODIN) 5-325 MG per tablet 1 to 2 tabs every 4 to 6 hours as needed for pain.  20 tablet  0  . neomycin-bacitracin-polymyxin (NEOSPORIN) [phone] ointment Apply 1 application topically daily. Apply to foot      . VITAMIN E PO Take 1 tablet by mouth daily as needed (Healing of foot).       No current facility-administered medications on file prior to visit.   Family History  Problem Relation Age of Onset  . Hypertension Father   . Kidney disease Mother    History   Social History  . Marital Status: Single    Spouse Name: N/A    Number of Children: N/A  . Years of Education:  N/A   Occupational History  . Not on file.   Social History Main Topics  . Smoking status: Never Smoker   . Smokeless tobacco: Never Used  . Alcohol Use: Yes     Comment: occasional  . Drug Use: No  . Sexual Activity: No   Other Topics Concern  . Not on file   Social History Narrative  . No narrative on file    Review of Systems  Respiratory: Negative.   Cardiovascular: Negative.   Musculoskeletal:       Leg pain   Neurological: Negative for dizziness.      Objective:   Filed Vitals:   01/19/14 1404  BP: 137/78  Pulse: 98  Temp: 98.5 F (36.9 C)  Resp: 14    Physical Exam  Cardiovascular: Normal rate, regular rhythm and normal heart sounds.   Pulses:      Dorsalis pedis pulses are 1+ on the right side, and 0 on the left side.  Pulmonary/Chest: Effort normal and breath sounds normal.  Abdominal: Soft. Bowel sounds are normal.  Musculoskeletal: She exhibits edema and tenderness (to heel).  Left heel ulcer--necrotic Left metatarsal head open draining ulcer No palpable pulses on left Very faint on right   Skin: Skin is warm and dry.    Lab Results  Component Value Date   WBC 9.1 09/15/2013   HGB 8.2* 09/15/2013   HCT 25.2* 09/15/2013   MCV 93.7 09/15/2013   PLT 395 09/15/2013   Lab Results  Component Value Date   CREATININE 8.43* 09/15/2013   BUN 35* 09/15/2013   NA 135* 09/15/2013   K 4.2 09/15/2013   CL 91* 09/15/2013   CO2 28 09/15/2013    Lab Results  Component Value Date   HGBA1C 6.6 01/19/2014   Lipid Panel     Component Value Date/Time   CHOL 136 10/27/2013 1121   TRIG 74 10/27/2013 1121   HDL 48 10/27/2013 1121   CHOLHDL 2.8 10/27/2013 1121   VLDL 15 10/27/2013 1121   LDLCALC 73 10/27/2013 1121       Assessment and plan:   [name] was seen today for follow-up.  Diagnoses and associated orders for this visit:  DM (diabetes mellitus) type II controlled peripheral vascular disorder - Glucose (CBG) - HgB A1c - glipiZIDE (GLUCOTROL) 10 MG  tablet; Take  0.5 tablets (5 mg total) by mouth daily. - aspirin 81 MG tablet; Take 1 tablet (81 mg total) by mouth daily. - DME Wheelchair electric  End stage renal disease on dialysis  Essential hypertension - amLODipine (NORVASC) 10 MG tablet; Take 0.5 tablets (5 mg total) by mouth at bedtime.  Need for prophylactic vaccination and inoculation against influenza Received  Patient wants to wait on kidney doctor to give her a pneumonia vaccine  Amputee, toe(s), left - DME Wheelchair electric  Return in about 3 months (around 04/20/2014).       Chari Manning, NP-C [facility] [phone] 01/19/2014, 2:30 PM

## 2014-01-20 DIAGNOSIS — N186 End stage renal disease: Secondary | ICD-10-CM | POA: Diagnosis not present

## 2014-01-21 ENCOUNTER — Encounter (HOSPITAL_BASED_OUTPATIENT_CLINIC_OR_DEPARTMENT_OTHER): Payer: Medicare Other | Attending: Plastic Surgery

## 2014-01-21 DIAGNOSIS — Z992 Dependence on renal dialysis: Secondary | ICD-10-CM | POA: Diagnosis not present

## 2014-01-21 DIAGNOSIS — E119 Type 2 diabetes mellitus without complications: Secondary | ICD-10-CM | POA: Diagnosis not present

## 2014-01-21 DIAGNOSIS — I120 Hypertensive chronic kidney disease with stage 5 chronic kidney disease or end stage renal disease: Secondary | ICD-10-CM | POA: Diagnosis not present

## 2014-01-21 DIAGNOSIS — N186 End stage renal disease: Secondary | ICD-10-CM | POA: Diagnosis not present

## 2014-01-21 DIAGNOSIS — E11621 Type 2 diabetes mellitus with foot ulcer: Secondary | ICD-10-CM | POA: Insufficient documentation

## 2014-01-21 DIAGNOSIS — L97421 Non-pressure chronic ulcer of left heel and midfoot limited to breakdown of skin: Secondary | ICD-10-CM | POA: Diagnosis not present

## 2014-01-21 DIAGNOSIS — L97521 Non-pressure chronic ulcer of other part of left foot limited to breakdown of skin: Secondary | ICD-10-CM | POA: Insufficient documentation

## 2014-01-21 DIAGNOSIS — S91302D Unspecified open wound, left foot, subsequent encounter: Secondary | ICD-10-CM | POA: Diagnosis not present

## 2014-01-21 DIAGNOSIS — L89620 Pressure ulcer of left heel, unstageable: Secondary | ICD-10-CM | POA: Diagnosis not present

## 2014-01-22 DIAGNOSIS — E1129 Type 2 diabetes mellitus with other diabetic kidney complication: Secondary | ICD-10-CM | POA: Diagnosis not present

## 2014-01-22 DIAGNOSIS — N186 End stage renal disease: Secondary | ICD-10-CM | POA: Diagnosis not present

## 2014-01-22 DIAGNOSIS — D631 Anemia in chronic kidney disease: Secondary | ICD-10-CM | POA: Diagnosis not present

## 2014-01-22 LAB — GLUCOSE, CAPILLARY: Glucose-Capillary: 200 mg/dL — ABNORMAL HIGH (ref 70–99)

## 2014-01-26 DIAGNOSIS — N186 End stage renal disease: Secondary | ICD-10-CM | POA: Diagnosis not present

## 2014-01-26 DIAGNOSIS — E119 Type 2 diabetes mellitus without complications: Secondary | ICD-10-CM | POA: Diagnosis not present

## 2014-01-26 DIAGNOSIS — S91302D Unspecified open wound, left foot, subsequent encounter: Secondary | ICD-10-CM | POA: Diagnosis not present

## 2014-01-26 DIAGNOSIS — Z992 Dependence on renal dialysis: Secondary | ICD-10-CM | POA: Diagnosis not present

## 2014-01-26 DIAGNOSIS — I120 Hypertensive chronic kidney disease with stage 5 chronic kidney disease or end stage renal disease: Secondary | ICD-10-CM | POA: Diagnosis not present

## 2014-01-26 DIAGNOSIS — L89620 Pressure ulcer of left heel, unstageable: Secondary | ICD-10-CM | POA: Diagnosis not present

## 2014-01-28 DIAGNOSIS — E11621 Type 2 diabetes mellitus with foot ulcer: Secondary | ICD-10-CM | POA: Diagnosis not present

## 2014-01-28 DIAGNOSIS — L97421 Non-pressure chronic ulcer of left heel and midfoot limited to breakdown of skin: Secondary | ICD-10-CM | POA: Diagnosis not present

## 2014-01-28 DIAGNOSIS — L97521 Non-pressure chronic ulcer of other part of left foot limited to breakdown of skin: Secondary | ICD-10-CM | POA: Diagnosis not present

## 2014-01-28 LAB — GLUCOSE, CAPILLARY: Glucose-Capillary: 171 mg/dL — ABNORMAL HIGH (ref 70–99)

## 2014-02-02 DIAGNOSIS — L89620 Pressure ulcer of left heel, unstageable: Secondary | ICD-10-CM | POA: Diagnosis not present

## 2014-02-02 DIAGNOSIS — I120 Hypertensive chronic kidney disease with stage 5 chronic kidney disease or end stage renal disease: Secondary | ICD-10-CM | POA: Diagnosis not present

## 2014-02-02 DIAGNOSIS — E119 Type 2 diabetes mellitus without complications: Secondary | ICD-10-CM | POA: Diagnosis not present

## 2014-02-02 DIAGNOSIS — I998 Other disorder of circulatory system: Secondary | ICD-10-CM | POA: Diagnosis not present

## 2014-02-02 DIAGNOSIS — N186 End stage renal disease: Secondary | ICD-10-CM | POA: Diagnosis not present

## 2014-02-02 DIAGNOSIS — Z992 Dependence on renal dialysis: Secondary | ICD-10-CM | POA: Diagnosis not present

## 2014-02-02 DIAGNOSIS — S91302D Unspecified open wound, left foot, subsequent encounter: Secondary | ICD-10-CM | POA: Diagnosis not present

## 2014-02-02 DIAGNOSIS — T148 Other injury of unspecified body region: Secondary | ICD-10-CM | POA: Diagnosis not present

## 2014-02-02 DIAGNOSIS — I739 Peripheral vascular disease, unspecified: Secondary | ICD-10-CM | POA: Diagnosis not present

## 2014-02-02 DIAGNOSIS — I10 Essential (primary) hypertension: Secondary | ICD-10-CM | POA: Diagnosis not present

## 2014-02-07 DIAGNOSIS — I120 Hypertensive chronic kidney disease with stage 5 chronic kidney disease or end stage renal disease: Secondary | ICD-10-CM | POA: Diagnosis not present

## 2014-02-07 DIAGNOSIS — S91302D Unspecified open wound, left foot, subsequent encounter: Secondary | ICD-10-CM | POA: Diagnosis not present

## 2014-02-07 DIAGNOSIS — N186 End stage renal disease: Secondary | ICD-10-CM | POA: Diagnosis not present

## 2014-02-07 DIAGNOSIS — Z89422 Acquired absence of other left toe(s): Secondary | ICD-10-CM | POA: Diagnosis not present

## 2014-02-07 DIAGNOSIS — E119 Type 2 diabetes mellitus without complications: Secondary | ICD-10-CM | POA: Diagnosis not present

## 2014-02-07 DIAGNOSIS — I739 Peripheral vascular disease, unspecified: Secondary | ICD-10-CM | POA: Diagnosis not present

## 2014-02-07 DIAGNOSIS — L89622 Pressure ulcer of left heel, stage 2: Secondary | ICD-10-CM | POA: Diagnosis not present

## 2014-02-07 DIAGNOSIS — Z992 Dependence on renal dialysis: Secondary | ICD-10-CM | POA: Diagnosis not present

## 2014-02-09 DIAGNOSIS — L89622 Pressure ulcer of left heel, stage 2: Secondary | ICD-10-CM | POA: Diagnosis not present

## 2014-02-09 DIAGNOSIS — I120 Hypertensive chronic kidney disease with stage 5 chronic kidney disease or end stage renal disease: Secondary | ICD-10-CM | POA: Diagnosis not present

## 2014-02-09 DIAGNOSIS — N186 End stage renal disease: Secondary | ICD-10-CM | POA: Diagnosis not present

## 2014-02-09 DIAGNOSIS — I739 Peripheral vascular disease, unspecified: Secondary | ICD-10-CM | POA: Diagnosis not present

## 2014-02-09 DIAGNOSIS — E119 Type 2 diabetes mellitus without complications: Secondary | ICD-10-CM | POA: Diagnosis not present

## 2014-02-09 DIAGNOSIS — S91302D Unspecified open wound, left foot, subsequent encounter: Secondary | ICD-10-CM | POA: Diagnosis not present

## 2014-02-11 DIAGNOSIS — E11621 Type 2 diabetes mellitus with foot ulcer: Secondary | ICD-10-CM | POA: Diagnosis not present

## 2014-02-11 DIAGNOSIS — L97421 Non-pressure chronic ulcer of left heel and midfoot limited to breakdown of skin: Secondary | ICD-10-CM | POA: Diagnosis not present

## 2014-02-11 DIAGNOSIS — L97521 Non-pressure chronic ulcer of other part of left foot limited to breakdown of skin: Secondary | ICD-10-CM | POA: Diagnosis not present

## 2014-02-11 LAB — GLUCOSE, CAPILLARY: Glucose-Capillary: 102 mg/dL — ABNORMAL HIGH (ref 70–99)

## 2014-02-16 DIAGNOSIS — I120 Hypertensive chronic kidney disease with stage 5 chronic kidney disease or end stage renal disease: Secondary | ICD-10-CM | POA: Diagnosis not present

## 2014-02-16 DIAGNOSIS — S91302D Unspecified open wound, left foot, subsequent encounter: Secondary | ICD-10-CM | POA: Diagnosis not present

## 2014-02-16 DIAGNOSIS — L89622 Pressure ulcer of left heel, stage 2: Secondary | ICD-10-CM | POA: Diagnosis not present

## 2014-02-16 DIAGNOSIS — N186 End stage renal disease: Secondary | ICD-10-CM | POA: Diagnosis not present

## 2014-02-16 DIAGNOSIS — E119 Type 2 diabetes mellitus without complications: Secondary | ICD-10-CM | POA: Diagnosis not present

## 2014-02-16 DIAGNOSIS — I739 Peripheral vascular disease, unspecified: Secondary | ICD-10-CM | POA: Diagnosis not present

## 2014-02-17 ENCOUNTER — Telehealth: Payer: Self-pay | Admitting: Internal Medicine

## 2014-02-17 DIAGNOSIS — E1129 Type 2 diabetes mellitus with other diabetic kidney complication: Secondary | ICD-10-CM | POA: Diagnosis not present

## 2014-02-17 NOTE — Telephone Encounter (Signed)
Patient called asking for pain medication refill. She mentioned Voltaren but that medicine is not in our records. Please follow up with Patient.

## 2014-02-18 NOTE — Telephone Encounter (Signed)
Spoke with patient she state that she is requesting Diazapam. We have never prescribed that medication to her and she states that she needs this due to her pain during dialysis and sore on ankle.

## 2014-02-20 DIAGNOSIS — Z992 Dependence on renal dialysis: Secondary | ICD-10-CM | POA: Diagnosis not present

## 2014-02-20 DIAGNOSIS — N186 End stage renal disease: Secondary | ICD-10-CM | POA: Diagnosis not present

## 2014-02-21 ENCOUNTER — Other Ambulatory Visit: Payer: Self-pay | Admitting: Internal Medicine

## 2014-02-21 NOTE — Telephone Encounter (Signed)
She has never been prescribed diazepam from our clinic, kidney function is very poor thereforeVoltaren will not be prescribed

## 2014-02-22 DIAGNOSIS — E1129 Type 2 diabetes mellitus with other diabetic kidney complication: Secondary | ICD-10-CM | POA: Diagnosis not present

## 2014-02-22 DIAGNOSIS — D631 Anemia in chronic kidney disease: Secondary | ICD-10-CM | POA: Diagnosis not present

## 2014-02-22 DIAGNOSIS — N186 End stage renal disease: Secondary | ICD-10-CM | POA: Diagnosis not present

## 2014-02-22 DIAGNOSIS — D509 Iron deficiency anemia, unspecified: Secondary | ICD-10-CM | POA: Diagnosis not present

## 2014-02-23 DIAGNOSIS — I120 Hypertensive chronic kidney disease with stage 5 chronic kidney disease or end stage renal disease: Secondary | ICD-10-CM | POA: Diagnosis not present

## 2014-02-23 DIAGNOSIS — L89622 Pressure ulcer of left heel, stage 2: Secondary | ICD-10-CM | POA: Diagnosis not present

## 2014-02-23 DIAGNOSIS — E119 Type 2 diabetes mellitus without complications: Secondary | ICD-10-CM | POA: Diagnosis not present

## 2014-02-23 DIAGNOSIS — I739 Peripheral vascular disease, unspecified: Secondary | ICD-10-CM | POA: Diagnosis not present

## 2014-02-23 DIAGNOSIS — S91302D Unspecified open wound, left foot, subsequent encounter: Secondary | ICD-10-CM | POA: Diagnosis not present

## 2014-02-23 DIAGNOSIS — N186 End stage renal disease: Secondary | ICD-10-CM | POA: Diagnosis not present

## 2014-02-25 ENCOUNTER — Emergency Department (HOSPITAL_COMMUNITY)
Admission: EM | Admit: 2014-02-25 | Discharge: 2014-02-25 | Disposition: A | Discharge status: Home / Self Care | Payer: Medicare Other | Attending: Emergency Medicine | Admitting: Emergency Medicine

## 2014-02-25 ENCOUNTER — Emergency Department (HOSPITAL_COMMUNITY): Payer: Medicare Other

## 2014-02-25 ENCOUNTER — Encounter (HOSPITAL_BASED_OUTPATIENT_CLINIC_OR_DEPARTMENT_OTHER): Payer: Medicare Other | Attending: Internal Medicine

## 2014-02-25 ENCOUNTER — Encounter (HOSPITAL_COMMUNITY): Payer: Self-pay | Admitting: Emergency Medicine

## 2014-02-25 DIAGNOSIS — J90 Pleural effusion, not elsewhere classified: Secondary | ICD-10-CM | POA: Diagnosis not present

## 2014-02-25 DIAGNOSIS — E119 Type 2 diabetes mellitus without complications: Secondary | ICD-10-CM | POA: Diagnosis not present

## 2014-02-25 DIAGNOSIS — F4489 Other dissociative and conversion disorders: Secondary | ICD-10-CM | POA: Diagnosis not present

## 2014-02-25 DIAGNOSIS — R079 Chest pain, unspecified: Secondary | ICD-10-CM

## 2014-02-25 DIAGNOSIS — R05 Cough: Secondary | ICD-10-CM | POA: Diagnosis not present

## 2014-02-25 DIAGNOSIS — I120 Hypertensive chronic kidney disease with stage 5 chronic kidney disease or end stage renal disease: Secondary | ICD-10-CM | POA: Insufficient documentation

## 2014-02-25 DIAGNOSIS — Z79899 Other long term (current) drug therapy: Secondary | ICD-10-CM | POA: Insufficient documentation

## 2014-02-25 DIAGNOSIS — Z7902 Long term (current) use of antithrombotics/antiplatelets: Secondary | ICD-10-CM | POA: Insufficient documentation

## 2014-02-25 DIAGNOSIS — J029 Acute pharyngitis, unspecified: Secondary | ICD-10-CM | POA: Diagnosis not present

## 2014-02-25 DIAGNOSIS — Z792 Long term (current) use of antibiotics: Secondary | ICD-10-CM | POA: Diagnosis not present

## 2014-02-25 DIAGNOSIS — Z7982 Long term (current) use of aspirin: Secondary | ICD-10-CM | POA: Diagnosis not present

## 2014-02-25 DIAGNOSIS — I10 Essential (primary) hypertension: Secondary | ICD-10-CM | POA: Diagnosis not present

## 2014-02-25 DIAGNOSIS — M549 Dorsalgia, unspecified: Secondary | ICD-10-CM | POA: Diagnosis not present

## 2014-02-25 DIAGNOSIS — Z8701 Personal history of pneumonia (recurrent): Secondary | ICD-10-CM | POA: Insufficient documentation

## 2014-02-25 DIAGNOSIS — J811 Chronic pulmonary edema: Secondary | ICD-10-CM | POA: Diagnosis not present

## 2014-02-25 DIAGNOSIS — I517 Cardiomegaly: Secondary | ICD-10-CM | POA: Diagnosis not present

## 2014-02-25 DIAGNOSIS — R0789 Other chest pain: Secondary | ICD-10-CM | POA: Diagnosis not present

## 2014-02-25 DIAGNOSIS — K219 Gastro-esophageal reflux disease without esophagitis: Secondary | ICD-10-CM | POA: Insufficient documentation

## 2014-02-25 DIAGNOSIS — Z8742 Personal history of other diseases of the female genital tract: Secondary | ICD-10-CM | POA: Insufficient documentation

## 2014-02-25 DIAGNOSIS — Z862 Personal history of diseases of the blood and blood-forming organs and certain disorders involving the immune mechanism: Secondary | ICD-10-CM | POA: Diagnosis not present

## 2014-02-25 DIAGNOSIS — J209 Acute bronchitis, unspecified: Secondary | ICD-10-CM | POA: Insufficient documentation

## 2014-02-25 DIAGNOSIS — Z8673 Personal history of transient ischemic attack (TIA), and cerebral infarction without residual deficits: Secondary | ICD-10-CM | POA: Insufficient documentation

## 2014-02-25 DIAGNOSIS — J40 Bronchitis, not specified as acute or chronic: Secondary | ICD-10-CM | POA: Diagnosis not present

## 2014-02-25 DIAGNOSIS — Z992 Dependence on renal dialysis: Secondary | ICD-10-CM | POA: Diagnosis not present

## 2014-02-25 DIAGNOSIS — N186 End stage renal disease: Secondary | ICD-10-CM | POA: Insufficient documentation

## 2014-02-25 DIAGNOSIS — I6789 Other cerebrovascular disease: Secondary | ICD-10-CM | POA: Diagnosis not present

## 2014-02-25 DIAGNOSIS — R4182 Altered mental status, unspecified: Secondary | ICD-10-CM

## 2014-02-25 LAB — COMPREHENSIVE METABOLIC PANEL
ALK PHOS: 71 U/L (ref 39–117)
ALT: 19 U/L (ref 0–35)
AST: 24 U/L (ref 0–37)
Albumin: 3.5 g/dL (ref 3.5–5.2)
Anion gap: 18 — ABNORMAL HIGH (ref 5–15)
BUN: 25 mg/dL — ABNORMAL HIGH (ref 6–23)
CO2: 28 meq/L (ref 19–32)
CREATININE: 6.85 mg/dL — AB (ref 0.50–1.10)
Calcium: 9.7 mg/dL (ref 8.4–10.5)
Chloride: 92 mEq/L — ABNORMAL LOW (ref 96–112)
GFR calc Af Amer: 7 mL/min — ABNORMAL LOW (ref >90)
GFR, EST NON AFRICAN AMERICAN: 6 mL/min — AB (ref >90)
Glucose, Bld: 192 mg/dL — ABNORMAL HIGH (ref 70–99)
Potassium: 3.9 mEq/L (ref 3.7–5.3)
SODIUM: 138 meq/L (ref 137–147)
Total Bilirubin: 0.8 mg/dL (ref 0.3–1.2)
Total Protein: 8.5 g/dL — ABNORMAL HIGH (ref 6.0–8.3)

## 2014-02-25 LAB — CBC WITH DIFFERENTIAL/PLATELET
Basophils Absolute: 0.1 10*3/uL (ref 0.0–0.1)
Basophils Relative: 1 % (ref 0–1)
Eosinophils Absolute: 0.4 10*3/uL (ref 0.0–0.7)
Eosinophils Relative: 4 % (ref 0–5)
HEMATOCRIT: 32 % — AB (ref 36.0–46.0)
Hemoglobin: 9.9 g/dL — ABNORMAL LOW (ref 12.0–15.0)
Lymphocytes Relative: 21 % (ref 12–46)
Lymphs Abs: 1.9 10*3/uL (ref 0.7–4.0)
MCH: 27.3 pg (ref 26.0–34.0)
MCHC: 30.9 g/dL (ref 30.0–36.0)
MCV: 88.4 fL (ref 78.0–100.0)
MONO ABS: 0.4 10*3/uL (ref 0.1–1.0)
Monocytes Relative: 5 % (ref 3–12)
NEUTROS ABS: 6.1 10*3/uL (ref 1.7–7.7)
Neutrophils Relative %: 69 % (ref 43–77)
Platelets: 282 10*3/uL (ref 150–400)
RBC: 3.62 MIL/uL — ABNORMAL LOW (ref 3.87–5.11)
RDW: 17.6 % — AB (ref 11.5–15.5)
WBC: 8.8 10*3/uL (ref 4.0–10.5)

## 2014-02-25 LAB — TROPONIN I: Troponin I: <0.3 ng/mL (ref <0.30)

## 2014-02-25 LAB — I-STAT TROPONIN, ED
TROPONIN I, POC: 0.03 ng/mL (ref 0.00–0.08)
Troponin i, poc: 0.03 ng/mL (ref 0.00–0.08)

## 2014-02-25 LAB — RAPID STREP SCREEN (MED CTR MEBANE ONLY): Streptococcus, Group A Screen (Direct): NEGATIVE

## 2014-02-25 MED ORDER — MORPHINE SULFATE 4 MG/ML IJ SOLN
4.0000 mg | Freq: Once | INTRAMUSCULAR | Status: DC
  Filled 2014-02-25: qty 1

## 2014-02-25 MED ORDER — ASPIRIN 325 MG PO TABS
325.0000 mg | ORAL_TABLET | Freq: Once | ORAL | Status: AC
  Administered 2014-02-25: 325 mg via ORAL
  Filled 2014-02-25: qty 1

## 2014-02-25 MED ORDER — AZITHROMYCIN 250 MG PO TABS: ORAL_TABLET | ORAL | Status: DC

## 2014-02-25 MED ORDER — GUAIFENESIN 100 MG/5ML PO LIQD: 100.0000 mg | ORAL | Status: DC | PRN

## 2014-02-25 MED ORDER — ACETAMINOPHEN 325 MG PO TABS
650.0000 mg | ORAL_TABLET | Freq: Once | ORAL | Status: AC
  Administered 2014-02-25: 650 mg via ORAL
  Filled 2014-02-25: qty 2

## 2014-02-25 MED ORDER — GUAIFENESIN ER 600 MG PO TB12
600.0000 mg | ORAL_TABLET | Freq: Once | ORAL | Status: AC
Start: 2014-02-25 — End: 2014-02-25
  Administered 2014-02-25: 600 mg via ORAL
  Filled 2014-02-25: qty 1

## 2014-02-25 NOTE — ED Notes (Signed)
PT on dialysis so does not produce urine.

## 2014-02-25 NOTE — ED Notes (Addendum)
Per EMS pt friend came to pt apartment and pt was not A&O to norm. Pt not able to complete full sentences, pt pointing to upper chest and throat stating "hurt". EMS reported pt friend sts pt has had a cough for a week. Pt is on dialysis MWF, denies n/v/d.  EMS vitals 160/78, 104HR, 16 RR, 285CBG

## 2014-02-25 NOTE — ED Notes (Signed)
Pt refusing morphine. Wants tylenol. MD aware.

## 2014-02-25 NOTE — ED Notes (Signed)
PT returned from xray without distress and placed back on monitor.

## 2014-02-25 NOTE — ED Provider Notes (Signed)
CSN: [id number]     Arrival date & time 02/25/14  1009 History   First MD Initiated Contact with Patient 02/25/14 1038     Chief Complaint  Patient presents with  . Altered Mental Status     (Consider location/radiation/quality/duration/timing/severity/associated sxs/prior Treatment) HPI Comments: Patient is pointing to her upper chest saying that she has pain. She's also pointing to upper back saying that she has pain. She states that this started yesterday. She has had associated productive cough with yellow sputum. She has had no known fevers but has had chills and feels very cold. She states she has not been in the hospital recently though has been on some antibiotics for a chronic lower extremity wound. She denies shortness of breath, abdominal pain, nausea, vomiting, diarrhea or urinary symptoms. She does not make urine every day. She has been compliant with her recent dialysis treatments. Some questions she answers completely inappropriately left thumb she only moans "hurt" to  Patient is a 53 y.o. female presenting with altered mental status. The history is provided by the patient. The history is limited by the absence of a caregiver and the condition of the patient.  Altered Mental Status Presenting symptoms: no confusion   Presenting symptoms comment:  Per EMS, patient's neighbor called 911 for altered mental status. EMS reported that the neighbor stated that she has had a cough for one week. Episode history:  Unable to specify Timing:  Unable to specify Progression:  Unable to specify Associated symptoms: no abdominal pain, no agitation, no fever, no headaches, no nausea, no palpitations, no vomiting and no weakness     Past Medical History  Diagnosis Date  . ESRD (end stage renal disease)   . Hypertension   . CVA (cerebral infarction)     right parietal 05/19/2000  . Vaginal bleeding   . Hyperparathyroidism   . Pneumonia   . Stroke     2002,no residual  . Peripheral  vascular disease   . GERD (gastroesophageal reflux disease)   . Anemia   . DM (diabetes mellitus) type II controlled peripheral vascular disorder   . Slip/trip w/o falling due to stepping on object, subs July  2014   Past Surgical History  Procedure Laterality Date  . Parathyroidectomy with autotransplant  12/07/2010  . Knee surgery      right, x2  . Brachial artery graft      x2 for dialysis  . Av fistula placement      upper rt thigh  . Dental extractions Bilateral   . Dilation and curettage of uterus      hx: of 1986  . 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 Leg surgery Right   . Foot surgery Left     5 TOES AMPUTATED   Family History  Problem Relation Age of Onset  . Hypertension Father   . Kidney disease Mother    History  Substance Use Topics  . Smoking status: Never Smoker   . Smokeless tobacco: Never Used  . Alcohol Use: Yes     Comment: occasional   OB History    No data available     Review of Systems  Constitutional: Positive for chills. Negative for fever, diaphoresis, activity change, appetite change and fatigue.  HENT: Positive for sore throat. Negative for congestion, facial swelling and rhinorrhea.   Eyes: Negative for photophobia and discharge.  Respiratory: Positive for chest tightness. Negative for cough and shortness of breath.   Cardiovascular: Positive for chest pain. Negative for palpitations and leg swelling.  Gastrointestinal: Negative for nausea, vomiting, abdominal pain and diarrhea.  Endocrine: Negative for polydipsia and polyuria.  Genitourinary: Negative for dysuria, frequency, difficulty urinating and pelvic pain.  Musculoskeletal: Positive for back pain. Negative for arthralgias, neck pain and neck stiffness.  Skin: Negative for color change and wound.  Allergic/Immunologic: Negative for  immunocompromised state.  Neurological: Negative for facial asymmetry, weakness, numbness and headaches.  Hematological: Does not bruise/bleed easily.  Psychiatric/Behavioral: Negative for confusion and agitation.      Allergies  Doxycycline and Peroxyl  Home Medications   Prior to Admission medications   Medication Sig Start Date End Date Taking? Authorizing Provider  Acetaminophen (TYLENOL PO) Take 1-2 tablets by mouth See admin instructions. Uses at dialysis Mon, Wed, Fridays each week for muscle spasms   Yes Historical Provider, MD  amLODipine (NORVASC) 10 MG tablet Take 0.5 tablets (5 mg total) by mouth at bedtime. 01/19/14  Yes Lance Bosch, NP  aspirin 81 MG tablet Take 1 tablet (81 mg total) by mouth daily. 01/19/14  Yes Lance Bosch, NP  CALCIUM-VITAMIN D PO Take 1 tablet by mouth 3 (three) times daily with meals.   Yes Historical Provider, MD  Clopidogrel Bisulfate (PLAVIX PO) Take 75 mg by mouth daily.    Yes Historical Provider, MD  Cyanocobalamin (VITAMIN B-12 PO) Take 1 tablet by mouth 4 (four) times a week. Non dialysis days   Yes Historical Provider, MD  diphenhydrAMINE (BENADRYL) 25 MG tablet Take 25 mg by mouth every 6 (six) hours as needed (Muscle spasm in feet).   Yes Historical Provider, MD  esomeprazole (NEXIUM) 40 MG capsule Take 40 mg by mouth daily as needed (acid reflux).    Yes Historical Provider, MD  Gauze Pads & Dressings (TELFA NON-ADHERENT DESSING) 2"X3" PADS 1 Units by Does not apply route daily. 12/09/12  Yes Freeman Caldron Baker, PA-C  glipiZIDE (GLUCOTROL) 10 MG tablet Take 0.5 tablets (5 mg total) by mouth daily. 01/19/14  Yes Lance Bosch, NP  Multiple Vitamin (MULTIVITAMIN WITH MINERALS) TABS tablet Take 1 tablet by mouth daily.   Yes Historical Provider, MD  multivitamin (RENA-VIT) TABS tablet Take 1 tablet by mouth daily.   Yes Historical Provider, MD  mupirocin ointment (BACTROBAN) 2 % Apply 1 application topically daily. 01/07/14  Yes Bronson Ing, DPM  neomycin-bacitracin-polymyxin (NEOSPORIN) [phone] ointment Apply 1 application topically daily. Apply to foot   Yes Historical Provider, MD  polyethylene glycol (MIRALAX / GLYCOLAX) packet Take 17 g by mouth daily as needed for mild constipation.   Yes Historical Provider, MD  VITAMIN E PO Take 1 tablet by mouth daily as needed (Healing of foot).   Yes Historical Provider, MD  azithromycin (ZITHROMAX Z-PAK) 250 MG tablet 2 po day one, then 1 daily x 4 days 02/25/14   Ernestina Patches, MD  cephALEXin (KEFLEX) 500 MG capsule Take 1 capsule (500 mg total) by mouth daily. Patient not taking: Reported on 02/25/2014 10/20/13   Lance Bosch, NP  ciprofloxacin (CIPRO) 500 MG tablet Take 1 tablet (500 mg total) by mouth daily. After dialysis Patient not taking: Reported on 02/25/2014 01/04/14   Liam Graham, PA-C  clindamycin (CLEOCIN) 300 MG capsule Take 1 capsule (300 mg total) by mouth 3 (three) times daily. Patient not taking: Reported on 02/25/2014 01/04/14   Liam Graham, PA-C  guaiFENesin (ROBITUSSIN) 100 MG/5ML liquid Take 5-10 mLs (100-200 mg total) by mouth every 4 (four) hours as needed for cough. 02/25/14   Ernestina Patches, MD   BP 144/75 mmHg  Pulse 98  Temp(Src) 98.9 F (37.2 C) (Oral)  Resp 18  SpO2 97%  LMP  (LMP Unknown) Physical Exam  Constitutional: She is oriented to person, place, and time. She appears well-developed and well-nourished. No distress.  HENT:  Head: Normocephalic and atraumatic.  Mouth/Throat: No oropharyngeal exudate.  Posterior oropharynx clear  Eyes: Pupils are equal, round, and reactive to light.  Neck: Normal range of motion. Neck supple.  Cardiovascular: Normal rate, regular rhythm and normal heart sounds.  Exam reveals no gallop and no friction rub.   No murmur heard. Pulmonary/Chest: Effort normal and breath sounds normal. No respiratory distress. She has no decreased breath sounds (no reproducible back or chest wall pain). She has no  wheezes. She has no rales.  No reproducible back or chest wall.  Abdominal: Soft. Bowel sounds are normal. She exhibits no distension and no mass. There is no tenderness. There is no rebound and no guarding.  Musculoskeletal: Normal range of motion. She exhibits no edema or tenderness.  Partial left foot amputation. Chronic well healing ulcer  Neurological: She is alert and oriented to person, place, and time. She has normal strength. She displays no tremor. No cranial nerve deficit or sensory deficit. Coordination normal. GCS eye subscore is 4. GCS verbal subscore is 4. GCS motor subscore is 6.  Does not answer questions consistently and will occasionally answer appropriately in full sentences.  Skin: Skin is warm and dry.  Psychiatric: She has a normal mood and affect.    ED Course  Procedures (including critical care time) Labs Review Labs Reviewed  CBC WITH DIFFERENTIAL - Abnormal; Notable for the following:    RBC 3.62 (*)    Hemoglobin 9.9 (*)    HCT 32.0 (*)    RDW 17.6 (*)    All other components within normal limits  COMPREHENSIVE METABOLIC PANEL - Abnormal; Notable for the following:    Chloride 92 (*)    Glucose, Bld 192 (*)    BUN 25 (*)    Creatinine, Ser 6.85 (*)    Total Protein 8.5 (*)    GFR calc non Af Amer 6 (*)    GFR calc Af Amer 7 (*)    Anion gap 18 (*)    All other components within normal limits  RAPID STREP SCREEN  CULTURE, GROUP A STREP  TROPONIN I  I-STAT TROPOININ, ED  I-STAT TROPOININ, ED    Imaging Review Dg Chest 2 View  02/25/2014   CLINICAL DATA:  Productive cough, fever x2 days, back tightness x1 week  EXAM: CHEST  2 VIEW  COMPARISON:  02/25/2014  FINDINGS: The cardiac silhouette and mediastinal contours are unchanged. The cardiac silhouette remains enlarged. The costophrenic angles are blunted, right greater left pleural effusions are present. There is prominence of the interstitial markings. Patchy right lung base opacity is present,  compressive atelectasis versus infiltrate. There is no pneumothorax.  IMPRESSION: 1. Bilateral pleural effusions, right greater than left. 2. Cardiomegaly with mild interstitial edema. 3. Right lung base consolidation, atelectasis versus pneumonitis.   Electronically Signed   By: Rosemarie Ax   On: 02/25/2014 14:00   Ct Head Wo Contrast  02/25/2014   CLINICAL DATA:  Altered mental status  EXAM: CT HEAD WITHOUT CONTRAST  TECHNIQUE: Contiguous axial images were obtained from the  base of the skull through the vertex without intravenous contrast.  COMPARISON:  CT head 07/16/2012  FINDINGS: Generalized atrophy. Chronic infarct right parietal lobe unchanged. Chronic microvascular ischemic change in the white matter unchanged.  Negative for acute infarct.  Negative for hemorrhage or mass lesion.  Atherosclerotic disease.  Calvarium intact.  IMPRESSION: Atrophy and chronic ischemia.  No acute abnormality.   Electronically Signed   By: Franchot Gallo M.D.   On: 02/25/2014 12:23   Dg Chest Port 1 View  02/25/2014   CLINICAL DATA:  Cough, mid chest and back pain for 1 week  EXAM: PORTABLE CHEST - 1 VIEW  COMPARISON:  Chest x-ray of 10/02/2012  FINDINGS: There is haziness at both lung bases most consistent with bilateral pleural effusions and atelectasis. Pneumonia cannot be excluded. The heart is mildly enlarged and there is some indistinctness of the perihilar vasculature which could indicate mild congestive heart failure. A lateral view of the chest may be helpful.  IMPRESSION: 1. Bilateral pleural effusions. 2. With cardiomegaly and some indistinctness of perihilar vasculature, mild CHF cannot be excluded. Consider lateral view of the chest.   Electronically Signed   By: Ivar Drape M.D.   On: 02/25/2014 11:28     EKG Interpretation   Date/Time:  Thursday February 25 2014 10:22:32 EST Ventricular Rate:  106 PR Interval:  154 QRS Duration: 79 QT Interval:  346 QTC Calculation: 459 R Axis:   101 Text  Interpretation:  Sinus tachycardia Probable lateral infarct, age  indeterminate TIW in inferior and low latera leads, seen on EKG from 2000,  but not on EKG from 2/4/'14 Confirmed by DOCHERTY  MD, [place] [phone]) on  02/25/2014 10:31:37 AM      MDM   Final diagnoses:  Chest pain  Sore throat  Bronchitis    Pt is a 53 y.o. female with Pmhx as above who presents with report of AMS. Pt points to upper chest & throat saying "hurt" repeatedly. She also reports cough, chills, denies fever, n/v, d/a, urinary symptoms, SOB. She will intermittently answer questions appropriately, but then will sometimes only answer in 1 word replies. Voice sounds hoarse, words are being spoken forefully. She is A&O to person, year, place. No focal neuro findings. Posterior oropharynx nml, lungs clear, abdomen benign. She has partial amputation of L foot with well healing chronic appearing foot ulcer.   13:40 patient speaking in full sentences using normal voice with radiology techs and has appropriately named all of her medications. She has refused morphine for pain, requests tylenol. She is not altered.   15:13PM Pt feeling improved, states she CP is better and she feels like she is not able to bring up more plegm. HR normalized. She is eating a meal tray which she has tolerated without difficulty. Heart rate 100. WNC nml, rectal temp midly elevated.  2 view CXR w/ R lung base consolidation atelectasis vs pneumonia. Given nml WBC, lack of fever, her well-appearance, I feel she more like has bronchitis than pna.  Will start on 5 d azithromycin, mucinex, outpt PCP f/u in 1-2 days.  Return precautions given for new or worsening symptoms including worsening pain, fever, SOB.  Given her report of CP, delta trop done and was negative.          Ernestina Patches, MD 02/26/14 [phone]

## 2014-02-25 NOTE — ED Notes (Signed)
Pt still complaining of chest pain; New EKG performed and given to MD.

## 2014-02-25 NOTE — Discharge Instructions (Signed)

## 2014-03-01 LAB — CULTURE, GROUP A STREP

## 2014-03-02 DIAGNOSIS — E875 Hyperkalemia: Secondary | ICD-10-CM | POA: Diagnosis not present

## 2014-03-02 DIAGNOSIS — Z8673 Personal history of transient ischemic attack (TIA), and cerebral infarction without residual deficits: Secondary | ICD-10-CM | POA: Diagnosis not present

## 2014-03-02 DIAGNOSIS — N186 End stage renal disease: Secondary | ICD-10-CM | POA: Diagnosis not present

## 2014-03-02 DIAGNOSIS — L89622 Pressure ulcer of left heel, stage 2: Secondary | ICD-10-CM | POA: Diagnosis not present

## 2014-03-02 DIAGNOSIS — I739 Peripheral vascular disease, unspecified: Secondary | ICD-10-CM | POA: Diagnosis not present

## 2014-03-02 DIAGNOSIS — E119 Type 2 diabetes mellitus without complications: Secondary | ICD-10-CM | POA: Diagnosis not present

## 2014-03-02 DIAGNOSIS — I771 Stricture of artery: Secondary | ICD-10-CM | POA: Diagnosis not present

## 2014-03-02 DIAGNOSIS — Z87891 Personal history of nicotine dependence: Secondary | ICD-10-CM | POA: Diagnosis not present

## 2014-03-02 DIAGNOSIS — I70245 Atherosclerosis of native arteries of left leg with ulceration of other part of foot: Secondary | ICD-10-CM | POA: Diagnosis not present

## 2014-03-02 DIAGNOSIS — Z89432 Acquired absence of left foot: Secondary | ICD-10-CM | POA: Diagnosis not present

## 2014-03-02 DIAGNOSIS — Z992 Dependence on renal dialysis: Secondary | ICD-10-CM | POA: Diagnosis not present

## 2014-03-02 DIAGNOSIS — I120 Hypertensive chronic kidney disease with stage 5 chronic kidney disease or end stage renal disease: Secondary | ICD-10-CM | POA: Diagnosis not present

## 2014-03-02 DIAGNOSIS — S91302D Unspecified open wound, left foot, subsequent encounter: Secondary | ICD-10-CM | POA: Diagnosis not present

## 2014-03-03 DIAGNOSIS — I120 Hypertensive chronic kidney disease with stage 5 chronic kidney disease or end stage renal disease: Secondary | ICD-10-CM | POA: Diagnosis not present

## 2014-03-03 DIAGNOSIS — N186 End stage renal disease: Secondary | ICD-10-CM | POA: Diagnosis not present

## 2014-03-03 DIAGNOSIS — Z992 Dependence on renal dialysis: Secondary | ICD-10-CM | POA: Diagnosis not present

## 2014-03-03 DIAGNOSIS — E875 Hyperkalemia: Secondary | ICD-10-CM | POA: Diagnosis not present

## 2014-03-03 DIAGNOSIS — I70245 Atherosclerosis of native arteries of left leg with ulceration of other part of foot: Secondary | ICD-10-CM | POA: Diagnosis not present

## 2014-03-03 DIAGNOSIS — E119 Type 2 diabetes mellitus without complications: Secondary | ICD-10-CM | POA: Diagnosis not present

## 2014-03-09 DIAGNOSIS — I120 Hypertensive chronic kidney disease with stage 5 chronic kidney disease or end stage renal disease: Secondary | ICD-10-CM | POA: Diagnosis not present

## 2014-03-09 DIAGNOSIS — S91302D Unspecified open wound, left foot, subsequent encounter: Secondary | ICD-10-CM | POA: Diagnosis not present

## 2014-03-09 DIAGNOSIS — E119 Type 2 diabetes mellitus without complications: Secondary | ICD-10-CM | POA: Diagnosis not present

## 2014-03-09 DIAGNOSIS — I739 Peripheral vascular disease, unspecified: Secondary | ICD-10-CM | POA: Diagnosis not present

## 2014-03-09 DIAGNOSIS — L89622 Pressure ulcer of left heel, stage 2: Secondary | ICD-10-CM | POA: Diagnosis not present

## 2014-03-09 DIAGNOSIS — N186 End stage renal disease: Secondary | ICD-10-CM | POA: Diagnosis not present

## 2014-03-22 DIAGNOSIS — Z992 Dependence on renal dialysis: Secondary | ICD-10-CM | POA: Diagnosis not present

## 2014-03-22 DIAGNOSIS — N186 End stage renal disease: Secondary | ICD-10-CM | POA: Diagnosis not present

## 2014-03-23 DIAGNOSIS — S91302D Unspecified open wound, left foot, subsequent encounter: Secondary | ICD-10-CM | POA: Diagnosis not present

## 2014-03-23 DIAGNOSIS — I739 Peripheral vascular disease, unspecified: Secondary | ICD-10-CM | POA: Diagnosis not present

## 2014-03-23 DIAGNOSIS — L89622 Pressure ulcer of left heel, stage 2: Secondary | ICD-10-CM | POA: Diagnosis not present

## 2014-03-23 DIAGNOSIS — E119 Type 2 diabetes mellitus without complications: Secondary | ICD-10-CM | POA: Diagnosis not present

## 2014-03-23 DIAGNOSIS — I120 Hypertensive chronic kidney disease with stage 5 chronic kidney disease or end stage renal disease: Secondary | ICD-10-CM | POA: Diagnosis not present

## 2014-03-23 DIAGNOSIS — N186 End stage renal disease: Secondary | ICD-10-CM | POA: Diagnosis not present

## 2014-03-24 DIAGNOSIS — E1129 Type 2 diabetes mellitus with other diabetic kidney complication: Secondary | ICD-10-CM | POA: Diagnosis not present

## 2014-03-24 DIAGNOSIS — N186 End stage renal disease: Secondary | ICD-10-CM | POA: Diagnosis not present

## 2014-03-24 DIAGNOSIS — D509 Iron deficiency anemia, unspecified: Secondary | ICD-10-CM | POA: Diagnosis not present

## 2014-03-24 DIAGNOSIS — N2581 Secondary hyperparathyroidism of renal origin: Secondary | ICD-10-CM | POA: Diagnosis not present

## 2014-03-24 DIAGNOSIS — D631 Anemia in chronic kidney disease: Secondary | ICD-10-CM | POA: Diagnosis not present

## 2014-03-25 ENCOUNTER — Encounter (HOSPITAL_BASED_OUTPATIENT_CLINIC_OR_DEPARTMENT_OTHER): Payer: Medicare Other | Attending: Internal Medicine

## 2014-03-25 DIAGNOSIS — E11621 Type 2 diabetes mellitus with foot ulcer: Secondary | ICD-10-CM | POA: Insufficient documentation

## 2014-03-25 DIAGNOSIS — L97521 Non-pressure chronic ulcer of other part of left foot limited to breakdown of skin: Secondary | ICD-10-CM | POA: Diagnosis not present

## 2014-03-25 DIAGNOSIS — L97421 Non-pressure chronic ulcer of left heel and midfoot limited to breakdown of skin: Secondary | ICD-10-CM | POA: Insufficient documentation

## 2014-03-26 DIAGNOSIS — N2581 Secondary hyperparathyroidism of renal origin: Secondary | ICD-10-CM | POA: Diagnosis not present

## 2014-03-26 DIAGNOSIS — N186 End stage renal disease: Secondary | ICD-10-CM | POA: Diagnosis not present

## 2014-03-26 DIAGNOSIS — D509 Iron deficiency anemia, unspecified: Secondary | ICD-10-CM | POA: Diagnosis not present

## 2014-03-26 DIAGNOSIS — E1129 Type 2 diabetes mellitus with other diabetic kidney complication: Secondary | ICD-10-CM | POA: Diagnosis not present

## 2014-03-26 DIAGNOSIS — D631 Anemia in chronic kidney disease: Secondary | ICD-10-CM | POA: Diagnosis not present

## 2014-03-29 DIAGNOSIS — N186 End stage renal disease: Secondary | ICD-10-CM | POA: Diagnosis not present

## 2014-03-29 DIAGNOSIS — D631 Anemia in chronic kidney disease: Secondary | ICD-10-CM | POA: Diagnosis not present

## 2014-03-29 DIAGNOSIS — D509 Iron deficiency anemia, unspecified: Secondary | ICD-10-CM | POA: Diagnosis not present

## 2014-03-29 DIAGNOSIS — E1129 Type 2 diabetes mellitus with other diabetic kidney complication: Secondary | ICD-10-CM | POA: Diagnosis not present

## 2014-03-29 DIAGNOSIS — N2581 Secondary hyperparathyroidism of renal origin: Secondary | ICD-10-CM | POA: Diagnosis not present

## 2014-03-30 DIAGNOSIS — N186 End stage renal disease: Secondary | ICD-10-CM | POA: Diagnosis not present

## 2014-03-30 DIAGNOSIS — I739 Peripheral vascular disease, unspecified: Secondary | ICD-10-CM | POA: Diagnosis not present

## 2014-03-30 DIAGNOSIS — S91302D Unspecified open wound, left foot, subsequent encounter: Secondary | ICD-10-CM | POA: Diagnosis not present

## 2014-03-30 DIAGNOSIS — L89622 Pressure ulcer of left heel, stage 2: Secondary | ICD-10-CM | POA: Diagnosis not present

## 2014-03-30 DIAGNOSIS — E119 Type 2 diabetes mellitus without complications: Secondary | ICD-10-CM | POA: Diagnosis not present

## 2014-03-30 DIAGNOSIS — I120 Hypertensive chronic kidney disease with stage 5 chronic kidney disease or end stage renal disease: Secondary | ICD-10-CM | POA: Diagnosis not present

## 2014-03-31 DIAGNOSIS — D631 Anemia in chronic kidney disease: Secondary | ICD-10-CM | POA: Diagnosis not present

## 2014-03-31 DIAGNOSIS — E1129 Type 2 diabetes mellitus with other diabetic kidney complication: Secondary | ICD-10-CM | POA: Diagnosis not present

## 2014-03-31 DIAGNOSIS — D509 Iron deficiency anemia, unspecified: Secondary | ICD-10-CM | POA: Diagnosis not present

## 2014-03-31 DIAGNOSIS — N186 End stage renal disease: Secondary | ICD-10-CM | POA: Diagnosis not present

## 2014-03-31 DIAGNOSIS — N2581 Secondary hyperparathyroidism of renal origin: Secondary | ICD-10-CM | POA: Diagnosis not present

## 2014-04-01 ENCOUNTER — Encounter (HOSPITAL_COMMUNITY): Payer: Self-pay | Admitting: Surgery

## 2014-04-02 DIAGNOSIS — D631 Anemia in chronic kidney disease: Secondary | ICD-10-CM | POA: Diagnosis not present

## 2014-04-02 DIAGNOSIS — E1129 Type 2 diabetes mellitus with other diabetic kidney complication: Secondary | ICD-10-CM | POA: Diagnosis not present

## 2014-04-02 DIAGNOSIS — D509 Iron deficiency anemia, unspecified: Secondary | ICD-10-CM | POA: Diagnosis not present

## 2014-04-02 DIAGNOSIS — N2581 Secondary hyperparathyroidism of renal origin: Secondary | ICD-10-CM | POA: Diagnosis not present

## 2014-04-02 DIAGNOSIS — N186 End stage renal disease: Secondary | ICD-10-CM | POA: Diagnosis not present

## 2014-04-05 DIAGNOSIS — N2581 Secondary hyperparathyroidism of renal origin: Secondary | ICD-10-CM | POA: Diagnosis not present

## 2014-04-05 DIAGNOSIS — D509 Iron deficiency anemia, unspecified: Secondary | ICD-10-CM | POA: Diagnosis not present

## 2014-04-05 DIAGNOSIS — E1129 Type 2 diabetes mellitus with other diabetic kidney complication: Secondary | ICD-10-CM | POA: Diagnosis not present

## 2014-04-05 DIAGNOSIS — D631 Anemia in chronic kidney disease: Secondary | ICD-10-CM | POA: Diagnosis not present

## 2014-04-05 DIAGNOSIS — N186 End stage renal disease: Secondary | ICD-10-CM | POA: Diagnosis not present

## 2014-04-06 DIAGNOSIS — N186 End stage renal disease: Secondary | ICD-10-CM | POA: Diagnosis not present

## 2014-04-06 DIAGNOSIS — I120 Hypertensive chronic kidney disease with stage 5 chronic kidney disease or end stage renal disease: Secondary | ICD-10-CM | POA: Diagnosis not present

## 2014-04-06 DIAGNOSIS — E119 Type 2 diabetes mellitus without complications: Secondary | ICD-10-CM | POA: Diagnosis not present

## 2014-04-06 DIAGNOSIS — S91302D Unspecified open wound, left foot, subsequent encounter: Secondary | ICD-10-CM | POA: Diagnosis not present

## 2014-04-06 DIAGNOSIS — L89622 Pressure ulcer of left heel, stage 2: Secondary | ICD-10-CM | POA: Diagnosis not present

## 2014-04-06 DIAGNOSIS — I739 Peripheral vascular disease, unspecified: Secondary | ICD-10-CM | POA: Diagnosis not present

## 2014-04-07 DIAGNOSIS — N186 End stage renal disease: Secondary | ICD-10-CM | POA: Diagnosis not present

## 2014-04-07 DIAGNOSIS — D509 Iron deficiency anemia, unspecified: Secondary | ICD-10-CM | POA: Diagnosis not present

## 2014-04-07 DIAGNOSIS — N2581 Secondary hyperparathyroidism of renal origin: Secondary | ICD-10-CM | POA: Diagnosis not present

## 2014-04-07 DIAGNOSIS — D631 Anemia in chronic kidney disease: Secondary | ICD-10-CM | POA: Diagnosis not present

## 2014-04-07 DIAGNOSIS — E1129 Type 2 diabetes mellitus with other diabetic kidney complication: Secondary | ICD-10-CM | POA: Diagnosis not present

## 2014-04-09 DIAGNOSIS — N186 End stage renal disease: Secondary | ICD-10-CM | POA: Diagnosis not present

## 2014-04-09 DIAGNOSIS — D509 Iron deficiency anemia, unspecified: Secondary | ICD-10-CM | POA: Diagnosis not present

## 2014-04-09 DIAGNOSIS — N2581 Secondary hyperparathyroidism of renal origin: Secondary | ICD-10-CM | POA: Diagnosis not present

## 2014-04-09 DIAGNOSIS — E1129 Type 2 diabetes mellitus with other diabetic kidney complication: Secondary | ICD-10-CM | POA: Diagnosis not present

## 2014-04-09 DIAGNOSIS — D631 Anemia in chronic kidney disease: Secondary | ICD-10-CM | POA: Diagnosis not present

## 2014-04-12 DIAGNOSIS — N2581 Secondary hyperparathyroidism of renal origin: Secondary | ICD-10-CM | POA: Diagnosis not present

## 2014-04-12 DIAGNOSIS — E1129 Type 2 diabetes mellitus with other diabetic kidney complication: Secondary | ICD-10-CM | POA: Diagnosis not present

## 2014-04-12 DIAGNOSIS — D509 Iron deficiency anemia, unspecified: Secondary | ICD-10-CM | POA: Diagnosis not present

## 2014-04-12 DIAGNOSIS — D631 Anemia in chronic kidney disease: Secondary | ICD-10-CM | POA: Diagnosis not present

## 2014-04-12 DIAGNOSIS — N186 End stage renal disease: Secondary | ICD-10-CM | POA: Diagnosis not present

## 2014-04-14 DIAGNOSIS — D631 Anemia in chronic kidney disease: Secondary | ICD-10-CM | POA: Diagnosis not present

## 2014-04-14 DIAGNOSIS — E1129 Type 2 diabetes mellitus with other diabetic kidney complication: Secondary | ICD-10-CM | POA: Diagnosis not present

## 2014-04-14 DIAGNOSIS — N2581 Secondary hyperparathyroidism of renal origin: Secondary | ICD-10-CM | POA: Diagnosis not present

## 2014-04-14 DIAGNOSIS — N186 End stage renal disease: Secondary | ICD-10-CM | POA: Diagnosis not present

## 2014-04-14 DIAGNOSIS — D509 Iron deficiency anemia, unspecified: Secondary | ICD-10-CM | POA: Diagnosis not present

## 2014-04-17 DIAGNOSIS — N186 End stage renal disease: Secondary | ICD-10-CM | POA: Diagnosis not present

## 2014-04-17 DIAGNOSIS — D509 Iron deficiency anemia, unspecified: Secondary | ICD-10-CM | POA: Diagnosis not present

## 2014-04-17 DIAGNOSIS — D631 Anemia in chronic kidney disease: Secondary | ICD-10-CM | POA: Diagnosis not present

## 2014-04-17 DIAGNOSIS — E1129 Type 2 diabetes mellitus with other diabetic kidney complication: Secondary | ICD-10-CM | POA: Diagnosis not present

## 2014-04-17 DIAGNOSIS — N2581 Secondary hyperparathyroidism of renal origin: Secondary | ICD-10-CM | POA: Diagnosis not present

## 2014-04-21 DIAGNOSIS — E1129 Type 2 diabetes mellitus with other diabetic kidney complication: Secondary | ICD-10-CM | POA: Diagnosis not present

## 2014-04-21 DIAGNOSIS — N2581 Secondary hyperparathyroidism of renal origin: Secondary | ICD-10-CM | POA: Diagnosis not present

## 2014-04-21 DIAGNOSIS — D509 Iron deficiency anemia, unspecified: Secondary | ICD-10-CM | POA: Diagnosis not present

## 2014-04-21 DIAGNOSIS — N186 End stage renal disease: Secondary | ICD-10-CM | POA: Diagnosis not present

## 2014-04-21 DIAGNOSIS — D631 Anemia in chronic kidney disease: Secondary | ICD-10-CM | POA: Diagnosis not present

## 2014-04-22 DIAGNOSIS — Z992 Dependence on renal dialysis: Secondary | ICD-10-CM | POA: Diagnosis not present

## 2014-04-22 DIAGNOSIS — N186 End stage renal disease: Secondary | ICD-10-CM | POA: Diagnosis not present

## 2014-04-24 DIAGNOSIS — N186 End stage renal disease: Secondary | ICD-10-CM | POA: Diagnosis not present

## 2014-04-24 DIAGNOSIS — D631 Anemia in chronic kidney disease: Secondary | ICD-10-CM | POA: Diagnosis not present

## 2014-04-24 DIAGNOSIS — D509 Iron deficiency anemia, unspecified: Secondary | ICD-10-CM | POA: Diagnosis not present

## 2014-04-24 DIAGNOSIS — E1129 Type 2 diabetes mellitus with other diabetic kidney complication: Secondary | ICD-10-CM | POA: Diagnosis not present

## 2014-04-29 ENCOUNTER — Encounter (HOSPITAL_BASED_OUTPATIENT_CLINIC_OR_DEPARTMENT_OTHER): Payer: Medicare Other | Attending: Internal Medicine

## 2014-04-29 DIAGNOSIS — L97422 Non-pressure chronic ulcer of left heel and midfoot with fat layer exposed: Secondary | ICD-10-CM | POA: Insufficient documentation

## 2014-04-30 ENCOUNTER — Encounter (HOSPITAL_BASED_OUTPATIENT_CLINIC_OR_DEPARTMENT_OTHER): Payer: Medicare Other

## 2014-05-06 DIAGNOSIS — N186 End stage renal disease: Secondary | ICD-10-CM | POA: Diagnosis not present

## 2014-05-06 DIAGNOSIS — T82858D Stenosis of vascular prosthetic devices, implants and grafts, subsequent encounter: Secondary | ICD-10-CM | POA: Diagnosis not present

## 2014-05-06 DIAGNOSIS — Z992 Dependence on renal dialysis: Secondary | ICD-10-CM | POA: Diagnosis not present

## 2014-05-06 DIAGNOSIS — I871 Compression of vein: Secondary | ICD-10-CM | POA: Diagnosis not present

## 2014-05-12 DIAGNOSIS — E1129 Type 2 diabetes mellitus with other diabetic kidney complication: Secondary | ICD-10-CM | POA: Diagnosis not present

## 2014-05-20 DIAGNOSIS — L97422 Non-pressure chronic ulcer of left heel and midfoot with fat layer exposed: Secondary | ICD-10-CM | POA: Diagnosis not present

## 2014-05-23 DIAGNOSIS — N186 End stage renal disease: Secondary | ICD-10-CM | POA: Diagnosis not present

## 2014-05-23 DIAGNOSIS — Z992 Dependence on renal dialysis: Secondary | ICD-10-CM | POA: Diagnosis not present

## 2014-05-24 DIAGNOSIS — N186 End stage renal disease: Secondary | ICD-10-CM | POA: Diagnosis not present

## 2014-05-24 DIAGNOSIS — E1129 Type 2 diabetes mellitus with other diabetic kidney complication: Secondary | ICD-10-CM | POA: Diagnosis not present

## 2014-06-03 ENCOUNTER — Encounter (HOSPITAL_BASED_OUTPATIENT_CLINIC_OR_DEPARTMENT_OTHER): Payer: Medicare Other | Attending: Internal Medicine

## 2014-06-16 ENCOUNTER — Ambulatory Visit: Payer: Medicare Other | Admitting: Vascular Surgery

## 2014-06-21 DIAGNOSIS — N186 End stage renal disease: Secondary | ICD-10-CM | POA: Diagnosis not present

## 2014-06-21 DIAGNOSIS — Z992 Dependence on renal dialysis: Secondary | ICD-10-CM | POA: Diagnosis not present

## 2014-06-22 ENCOUNTER — Encounter: Payer: Self-pay | Admitting: Vascular Surgery

## 2014-06-23 ENCOUNTER — Ambulatory Visit: Payer: Medicare Other | Admitting: Vascular Surgery

## 2014-06-23 DIAGNOSIS — N186 End stage renal disease: Secondary | ICD-10-CM | POA: Diagnosis not present

## 2014-06-23 DIAGNOSIS — E1129 Type 2 diabetes mellitus with other diabetic kidney complication: Secondary | ICD-10-CM | POA: Diagnosis not present

## 2014-07-22 DIAGNOSIS — N186 End stage renal disease: Secondary | ICD-10-CM | POA: Diagnosis not present

## 2014-07-22 DIAGNOSIS — Z992 Dependence on renal dialysis: Secondary | ICD-10-CM | POA: Diagnosis not present

## 2014-07-22 DIAGNOSIS — E1129 Type 2 diabetes mellitus with other diabetic kidney complication: Secondary | ICD-10-CM | POA: Diagnosis not present

## 2014-07-23 DIAGNOSIS — E1129 Type 2 diabetes mellitus with other diabetic kidney complication: Secondary | ICD-10-CM | POA: Diagnosis not present

## 2014-07-23 DIAGNOSIS — N186 End stage renal disease: Secondary | ICD-10-CM | POA: Diagnosis not present

## 2014-07-23 DIAGNOSIS — N2581 Secondary hyperparathyroidism of renal origin: Secondary | ICD-10-CM | POA: Diagnosis not present

## 2014-07-26 DIAGNOSIS — N2581 Secondary hyperparathyroidism of renal origin: Secondary | ICD-10-CM | POA: Diagnosis not present

## 2014-07-26 DIAGNOSIS — N186 End stage renal disease: Secondary | ICD-10-CM | POA: Diagnosis not present

## 2014-07-26 DIAGNOSIS — E1129 Type 2 diabetes mellitus with other diabetic kidney complication: Secondary | ICD-10-CM | POA: Diagnosis not present

## 2014-07-28 DIAGNOSIS — N186 End stage renal disease: Secondary | ICD-10-CM | POA: Diagnosis not present

## 2014-07-28 DIAGNOSIS — E1129 Type 2 diabetes mellitus with other diabetic kidney complication: Secondary | ICD-10-CM | POA: Diagnosis not present

## 2014-07-28 DIAGNOSIS — N2581 Secondary hyperparathyroidism of renal origin: Secondary | ICD-10-CM | POA: Diagnosis not present

## 2014-07-29 DIAGNOSIS — I871 Compression of vein: Secondary | ICD-10-CM | POA: Diagnosis not present

## 2014-07-29 DIAGNOSIS — N186 End stage renal disease: Secondary | ICD-10-CM | POA: Diagnosis not present

## 2014-07-29 DIAGNOSIS — Z992 Dependence on renal dialysis: Secondary | ICD-10-CM | POA: Diagnosis not present

## 2014-07-29 DIAGNOSIS — T82858D Stenosis of vascular prosthetic devices, implants and grafts, subsequent encounter: Secondary | ICD-10-CM | POA: Diagnosis not present

## 2014-07-30 DIAGNOSIS — E1129 Type 2 diabetes mellitus with other diabetic kidney complication: Secondary | ICD-10-CM | POA: Diagnosis not present

## 2014-07-30 DIAGNOSIS — N2581 Secondary hyperparathyroidism of renal origin: Secondary | ICD-10-CM | POA: Diagnosis not present

## 2014-07-30 DIAGNOSIS — N186 End stage renal disease: Secondary | ICD-10-CM | POA: Diagnosis not present

## 2014-08-02 DIAGNOSIS — E1129 Type 2 diabetes mellitus with other diabetic kidney complication: Secondary | ICD-10-CM | POA: Diagnosis not present

## 2014-08-02 DIAGNOSIS — N2581 Secondary hyperparathyroidism of renal origin: Secondary | ICD-10-CM | POA: Diagnosis not present

## 2014-08-02 DIAGNOSIS — N186 End stage renal disease: Secondary | ICD-10-CM | POA: Diagnosis not present

## 2014-08-04 DIAGNOSIS — E1129 Type 2 diabetes mellitus with other diabetic kidney complication: Secondary | ICD-10-CM | POA: Diagnosis not present

## 2014-08-04 DIAGNOSIS — N2581 Secondary hyperparathyroidism of renal origin: Secondary | ICD-10-CM | POA: Diagnosis not present

## 2014-08-04 DIAGNOSIS — N186 End stage renal disease: Secondary | ICD-10-CM | POA: Diagnosis not present

## 2014-08-06 DIAGNOSIS — N186 End stage renal disease: Secondary | ICD-10-CM | POA: Diagnosis not present

## 2014-08-06 DIAGNOSIS — N2581 Secondary hyperparathyroidism of renal origin: Secondary | ICD-10-CM | POA: Diagnosis not present

## 2014-08-06 DIAGNOSIS — E1129 Type 2 diabetes mellitus with other diabetic kidney complication: Secondary | ICD-10-CM | POA: Diagnosis not present

## 2014-08-09 DIAGNOSIS — E1129 Type 2 diabetes mellitus with other diabetic kidney complication: Secondary | ICD-10-CM | POA: Diagnosis not present

## 2014-08-09 DIAGNOSIS — N2581 Secondary hyperparathyroidism of renal origin: Secondary | ICD-10-CM | POA: Diagnosis not present

## 2014-08-09 DIAGNOSIS — N186 End stage renal disease: Secondary | ICD-10-CM | POA: Diagnosis not present

## 2014-08-11 DIAGNOSIS — N2581 Secondary hyperparathyroidism of renal origin: Secondary | ICD-10-CM | POA: Diagnosis not present

## 2014-08-11 DIAGNOSIS — E1129 Type 2 diabetes mellitus with other diabetic kidney complication: Secondary | ICD-10-CM | POA: Diagnosis not present

## 2014-08-11 DIAGNOSIS — N186 End stage renal disease: Secondary | ICD-10-CM | POA: Diagnosis not present

## 2014-08-13 DIAGNOSIS — N2581 Secondary hyperparathyroidism of renal origin: Secondary | ICD-10-CM | POA: Diagnosis not present

## 2014-08-13 DIAGNOSIS — E1129 Type 2 diabetes mellitus with other diabetic kidney complication: Secondary | ICD-10-CM | POA: Diagnosis not present

## 2014-08-13 DIAGNOSIS — N186 End stage renal disease: Secondary | ICD-10-CM | POA: Diagnosis not present

## 2014-08-15 ENCOUNTER — Inpatient Hospital Stay (HOSPITAL_COMMUNITY)
Admission: EM | Admit: 2014-08-15 | Discharge: 2014-08-21 | DRG: 186 | Disposition: A | Discharge status: Home / Self Care | Payer: Medicare Other | Attending: Internal Medicine | Admitting: Internal Medicine

## 2014-08-15 DIAGNOSIS — I120 Hypertensive chronic kidney disease with stage 5 chronic kidney disease or end stage renal disease: Secondary | ICD-10-CM | POA: Diagnosis present

## 2014-08-15 DIAGNOSIS — J90 Pleural effusion, not elsewhere classified: Secondary | ICD-10-CM | POA: Diagnosis present

## 2014-08-15 DIAGNOSIS — R0602 Shortness of breath: Secondary | ICD-10-CM | POA: Diagnosis not present

## 2014-08-15 DIAGNOSIS — D649 Anemia, unspecified: Secondary | ICD-10-CM | POA: Diagnosis present

## 2014-08-15 DIAGNOSIS — J9811 Atelectasis: Secondary | ICD-10-CM | POA: Diagnosis not present

## 2014-08-15 DIAGNOSIS — E1151 Type 2 diabetes mellitus with diabetic peripheral angiopathy without gangrene: Secondary | ICD-10-CM | POA: Diagnosis present

## 2014-08-15 DIAGNOSIS — Z8673 Personal history of transient ischemic attack (TIA), and cerebral infarction without residual deficits: Secondary | ICD-10-CM

## 2014-08-15 DIAGNOSIS — Z7902 Long term (current) use of antithrombotics/antiplatelets: Secondary | ICD-10-CM | POA: Diagnosis not present

## 2014-08-15 DIAGNOSIS — R079 Chest pain, unspecified: Secondary | ICD-10-CM | POA: Insufficient documentation

## 2014-08-15 DIAGNOSIS — R109 Unspecified abdominal pain: Secondary | ICD-10-CM | POA: Diagnosis present

## 2014-08-15 DIAGNOSIS — K219 Gastro-esophageal reflux disease without esophagitis: Secondary | ICD-10-CM | POA: Diagnosis present

## 2014-08-15 DIAGNOSIS — N2581 Secondary hyperparathyroidism of renal origin: Secondary | ICD-10-CM | POA: Diagnosis present

## 2014-08-15 DIAGNOSIS — Z7982 Long term (current) use of aspirin: Secondary | ICD-10-CM | POA: Diagnosis not present

## 2014-08-15 DIAGNOSIS — R1084 Generalized abdominal pain: Secondary | ICD-10-CM | POA: Diagnosis not present

## 2014-08-15 DIAGNOSIS — K828 Other specified diseases of gallbladder: Secondary | ICD-10-CM | POA: Diagnosis not present

## 2014-08-15 DIAGNOSIS — I82C22 Chronic embolism and thrombosis of left internal jugular vein: Secondary | ICD-10-CM | POA: Diagnosis present

## 2014-08-15 DIAGNOSIS — J181 Lobar pneumonia, unspecified organism: Secondary | ICD-10-CM | POA: Diagnosis not present

## 2014-08-15 DIAGNOSIS — R0902 Hypoxemia: Secondary | ICD-10-CM

## 2014-08-15 DIAGNOSIS — K8020 Calculus of gallbladder without cholecystitis without obstruction: Secondary | ICD-10-CM | POA: Diagnosis present

## 2014-08-15 DIAGNOSIS — N189 Chronic kidney disease, unspecified: Secondary | ICD-10-CM

## 2014-08-15 DIAGNOSIS — N186 End stage renal disease: Secondary | ICD-10-CM

## 2014-08-15 DIAGNOSIS — J869 Pyothorax without fistula: Secondary | ICD-10-CM | POA: Diagnosis present

## 2014-08-15 DIAGNOSIS — Z452 Encounter for adjustment and management of vascular access device: Secondary | ICD-10-CM | POA: Diagnosis not present

## 2014-08-15 DIAGNOSIS — R1011 Right upper quadrant pain: Secondary | ICD-10-CM | POA: Diagnosis not present

## 2014-08-15 DIAGNOSIS — I82611 Acute embolism and thrombosis of superficial veins of right upper extremity: Secondary | ICD-10-CM | POA: Diagnosis not present

## 2014-08-15 DIAGNOSIS — K819 Cholecystitis, unspecified: Secondary | ICD-10-CM | POA: Insufficient documentation

## 2014-08-15 DIAGNOSIS — R230 Cyanosis: Secondary | ICD-10-CM

## 2014-08-15 DIAGNOSIS — Z992 Dependence on renal dialysis: Secondary | ICD-10-CM

## 2014-08-15 DIAGNOSIS — N185 Chronic kidney disease, stage 5: Secondary | ICD-10-CM | POA: Diagnosis not present

## 2014-08-15 DIAGNOSIS — I10 Essential (primary) hypertension: Secondary | ICD-10-CM | POA: Diagnosis not present

## 2014-08-15 DIAGNOSIS — E1129 Type 2 diabetes mellitus with other diabetic kidney complication: Secondary | ICD-10-CM | POA: Diagnosis not present

## 2014-08-15 DIAGNOSIS — I82290 Acute embolism and thrombosis of other thoracic veins: Secondary | ICD-10-CM | POA: Diagnosis not present

## 2014-08-15 DIAGNOSIS — Z9889 Other specified postprocedural states: Secondary | ICD-10-CM

## 2014-08-15 DIAGNOSIS — D631 Anemia in chronic kidney disease: Secondary | ICD-10-CM | POA: Diagnosis not present

## 2014-08-15 DIAGNOSIS — I82291 Chronic embolism and thrombosis of other thoracic veins: Secondary | ICD-10-CM | POA: Diagnosis present

## 2014-08-15 DIAGNOSIS — R0789 Other chest pain: Secondary | ICD-10-CM | POA: Diagnosis not present

## 2014-08-15 DIAGNOSIS — J948 Other specified pleural conditions: Secondary | ICD-10-CM | POA: Diagnosis not present

## 2014-08-15 DIAGNOSIS — E1159 Type 2 diabetes mellitus with other circulatory complications: Secondary | ICD-10-CM | POA: Diagnosis not present

## 2014-08-15 NOTE — ED Provider Notes (Signed)
CSN: [id number]     Arrival date & time 08/15/14  2353 History  This chart was scribed for Elizabeth Balls, MD by Eustaquio Maize, ED Scribe. This patient was seen in room B18C/B18C and the patient's care was started at 11:57 PM.    Chief Complaint  Patient presents with  . Abdominal Pain   The history is provided by the patient and the EMS personnel. No language interpreter was used.     HPI Comments: [name] is a 54 y.o. female brought in by ambulance, with hx HTN, GERD, DM, and ESRD on dialysis MWF who presents to the Emergency Department complaining of right sided chest pain and lower back pain that began earlier today. She describes the pain as a cramping sensation.Pt states she feels as though she has gas and attributes this to the pain. She also complains of shortness of breath. Pt took Rolaids earlier today without relief. Denies nausea, vomiting, fever, diarrhea, or any other symptoms.   Past Medical History  Diagnosis Date  . ESRD (end stage renal disease)   . Hypertension   . CVA (cerebral infarction)     right parietal 05/19/2000  . Vaginal bleeding   . Hyperparathyroidism   . Pneumonia   . Stroke     2002,no residual  . Peripheral vascular disease   . GERD (gastroesophageal reflux disease)   . Anemia   . DM (diabetes mellitus) type II controlled peripheral vascular disorder   . Slip/trip w/o falling due to stepping on object, subs July  2014   Past Surgical History  Procedure Laterality Date  . Parathyroidectomy with autotransplant  12/07/2010  . Knee surgery      right, x2  . Brachial artery graft      x2 for dialysis  . Av fistula placement      upper rt thigh  . Dental extractions Bilateral   . Dilation and curettage of uterus      hx: of 1986  . Transmetatarsal amputation Left 07/15/2012    Procedure: TRANSMETATARSAL AMPUTATION;  Surgeon: Angelia Mould, MD;  Location: [facility] OR;  Service: Vascular;  Laterality: Left;  . Leg surgery Right   . Foot  surgery Left     5 TOES AMPUTATED  . Abdominal aortagram N/A 05/27/2012    Procedure: ABDOMINAL Maxcine Ham;  Surgeon: Serafina Mitchell, MD;  Location: [facility] CATH LAB;  Service: Cardiovascular;  Laterality: N/A;  . Lower extremity angiogram Left 05/27/2012    Procedure: LOWER EXTREMITY ANGIOGRAM;  Surgeon: Serafina Mitchell, MD;  Location: [facility] CATH LAB;  Service: Cardiovascular;  Laterality: Left;  lt leg angio   Family History  Problem Relation Age of Onset  . Hypertension Father   . Kidney disease Mother    History  Substance Use Topics  . Smoking status: Never Smoker   . Smokeless tobacco: Never Used  . Alcohol Use: Yes     Comment: occasional   OB History    No data available     Review of Systems  10 Systems reviewed and all are negative for acute change except as noted in the HPI.   Allergies  Doxycycline and Peroxyl  Home Medications   Prior to Admission medications   Medication Sig Start Date End Date Taking? Authorizing Provider  Acetaminophen (TYLENOL PO) Take 1-2 tablets by mouth See admin instructions. Uses at dialysis Mon, Wed, Fridays each week for muscle spasms    Historical Provider, MD  amLODipine (NORVASC) 10 MG tablet Take 0.5 tablets (  5 mg total) by mouth at bedtime. 01/19/14   Lance Bosch, NP  aspirin 81 MG tablet Take 1 tablet (81 mg total) by mouth daily. 01/19/14   Lance Bosch, NP  azithromycin (ZITHROMAX Z-PAK) 250 MG tablet 2 po day one, then 1 daily x 4 days 02/25/14   Ernestina Patches, MD  CALCIUM-VITAMIN D PO Take 1 tablet by mouth 3 (three) times daily with meals.    Historical Provider, MD  cephALEXin (KEFLEX) 500 MG capsule Take 1 capsule (500 mg total) by mouth daily. Patient not taking: Reported on 02/25/2014 10/20/13   Lance Bosch, NP  ciprofloxacin (CIPRO) 500 MG tablet Take 1 tablet (500 mg total) by mouth daily. After dialysis Patient not taking: Reported on 02/25/2014 01/04/14   Liam Graham, PA-C  clindamycin (CLEOCIN) 300 MG capsule Take 1  capsule (300 mg total) by mouth 3 (three) times daily. Patient not taking: Reported on 02/25/2014 01/04/14   Liam Graham, PA-C  Clopidogrel Bisulfate (PLAVIX PO) Take 75 mg by mouth daily.     Historical Provider, MD  Cyanocobalamin (VITAMIN B-12 PO) Take 1 tablet by mouth 4 (four) times a week. Non dialysis days    Historical Provider, MD  diphenhydrAMINE (BENADRYL) 25 MG tablet Take 25 mg by mouth every 6 (six) hours as needed (Muscle spasm in feet).    Historical Provider, MD  esomeprazole (NEXIUM) 40 MG capsule Take 40 mg by mouth daily as needed (acid reflux).     Historical Provider, MD  Gauze Pads & Dressings (TELFA NON-ADHERENT DESSING) 2"X3" PADS 1 Units by Does not apply route daily. 12/09/12   Freeman Caldron Baker, PA-C  glipiZIDE (GLUCOTROL) 10 MG tablet Take 0.5 tablets (5 mg total) by mouth daily. 01/19/14   Lance Bosch, NP  guaiFENesin (ROBITUSSIN) 100 MG/5ML liquid Take 5-10 mLs (100-200 mg total) by mouth every 4 (four) hours as needed for cough. 02/25/14   Ernestina Patches, MD  Multiple Vitamin (MULTIVITAMIN WITH MINERALS) TABS tablet Take 1 tablet by mouth daily.    Historical Provider, MD  multivitamin (RENA-VIT) TABS tablet Take 1 tablet by mouth daily.    Historical Provider, MD  mupirocin ointment (BACTROBAN) 2 % Apply 1 application topically daily. 01/07/14   Bronson Ing, DPM  neomycin-bacitracin-polymyxin (NEOSPORIN) [phone] ointment Apply 1 application topically daily. Apply to foot    Historical Provider, MD  polyethylene glycol (MIRALAX / GLYCOLAX) packet Take 17 g by mouth daily as needed for mild constipation.    Historical Provider, MD  VITAMIN E PO Take 1 tablet by mouth daily as needed (Healing of foot).    Historical Provider, MD   Triage Vitals: BP 198/90 mmHg  Pulse 117  Temp(Src) 98.7 F (37.1 C) (Oral)  Resp 25  Ht 5\' 2"  (1.575 m)  Wt 165 lb (74.844 kg)  BMI 30.17 kg/m2  SpO2 97%   Physical Exam  Constitutional: She is oriented to person, place, and  time. She appears well-developed and well-nourished. She appears distressed.  HENT:  Head: Normocephalic and atraumatic.  Nose: Nose normal.  Mouth/Throat: Oropharynx is clear and moist. No oropharyngeal exudate.  Eyes: Conjunctivae and EOM are normal. Pupils are equal, round, and reactive to light. No scleral icterus.  Neck: Normal range of motion. Neck supple. No JVD present. No tracheal deviation present. No thyromegaly present.  Cardiovascular: Regular rhythm and normal heart sounds.  Tachycardia present.  Exam reveals no gallop and no friction rub.   No murmur heard. Pulmonary/Chest:  Effort normal and breath sounds normal. No respiratory distress. She has no wheezes. She exhibits no tenderness.  Abdominal: Soft. Bowel sounds are normal. She exhibits no distension and no mass. There is no tenderness. There is no rebound and no guarding.  Musculoskeletal: Normal range of motion. She exhibits no edema or tenderness.   LUE AV fistula with no thrill. RLE fistula with palpable thrill.   Lymphadenopathy:    She has no cervical adenopathy.  Neurological: She is alert and oriented to person, place, and time. No cranial nerve deficit. She exhibits normal muscle tone.  Skin: Skin is warm and dry. No rash noted. No erythema. No pallor.  Nursing note and vitals reviewed.   ED Course  Procedures (including critical care time)  DIAGNOSTIC STUDIES: Oxygen Saturation is 97% on RA, normal by my interpretation.    COORDINATION OF CARE: 12:05 AM-Discussed treatment plan with pt at bedside and pt agreed to plan.   Labs Review Labs Reviewed  CBC WITH DIFFERENTIAL/PLATELET - Abnormal; Notable for the following:    WBC 11.9 (*)    Neutro Abs 9.1 (*)    All other components within normal limits  COMPREHENSIVE METABOLIC PANEL - Abnormal; Notable for the following:    Sodium 129 (*)    Chloride 85 (*)    Glucose, Bld 233 (*)    BUN 63 (*)    Creatinine, Ser 12.41 (*)    Total Protein 8.8 (*)     Albumin 3.2 (*)    GFR calc non Af Amer 3 (*)    GFR calc Af Amer 4 (*)    Anion gap 20 (*)    All other components within normal limits  PROTIME-INR - Abnormal; Notable for the following:    Prothrombin Time 15.5 (*)    All other components within normal limits  I-STAT CHEM 8, ED - Abnormal; Notable for the following:    Sodium 129 (*)    Chloride 91 (*)    BUN 58 (*)    Creatinine, Ser 11.40 (*)    Glucose, Bld 239 (*)    Calcium, Ion 0.99 (*)    Hemoglobin 15.3 (*)    All other components within normal limits  I-STAT ARTERIAL BLOOD GAS, ED - Abnormal; Notable for the following:    pH, Arterial 7.455 (*)    pO2, Arterial 60.0 (*)    Bicarbonate 25.9 (*)    All other components within normal limits  LIPASE, BLOOD  BLOOD GAS, ARTERIAL  I-STAT CG4 LACTIC ACID, ED  I-STAT TROPOININ, ED    Imaging Review Dg Chest 2 View  08/16/2014   CLINICAL DATA:  54 year old female with several day history of chest and back pain accompanied by shortness of breath  EXAM: CHEST  2 VIEW  COMPARISON:  Prior chest x-ray 02/25/2014  FINDINGS: Large right layering pleural effusion with associated dense opacification of the right mid and lower chest. Mild pulmonary vascular congestion without edema. Cardiac and mediastinal contours are grossly within normal limits. No focal airspace consolidation in the left lung. No acute osseous abnormality.  IMPRESSION: 1. Large right layering pleural effusion with associated dense right mid and basilar airspace opacity. Findings are favored to reflect pleural fluid with atelectasis. Superimposed infiltrate/ pneumonia and mass are difficult to exclude radiographically. 2. Consider further evaluation with CT scan of the chest with contrast.   Electronically Signed   By: Jacqulynn Cadet M.D.   On: 08/16/2014 02:24     EKG Interpretation   Date/Time:  Monday August 16 2014 00:06:50 EDT Ventricular Rate:  117 PR Interval:  144 QRS Duration: 74 QT Interval:  316 QTC  Calculation: 441 R Axis:   71 Text Interpretation:  Sinus tachycardia Confirmed by Glynn Octave  [phone]) on 08/16/2014 12:50:39 AM      MDM   Final diagnoses:  None   patient since emergency department for shortness of breath. X-ray shows a large right-sided pleural effusion. ABG shows a SPO2 of 60. Her oxygen saturation continues to be greater than 95% on room air. This may be a new pleural effusion versus fluid overload from end-stage renal disease. Patient will require admission to the hospital for continued management. We'll give IV fluid bolus for tachycardia.  I spoke with Dr. Alcario Drought with [facility] hospitalist who will admit the patient to telemetry for continued management.  Angiocath insertion Performed by: [name]  Consent: Verbal consent obtained. Risks and benefits: risks, benefits and alternatives were discussed Time out: Immediately prior to procedure a "time out" was called to verify the correct patient, procedure, equipment, support staff and site/side marked as required.  Preparation: Patient was prepped and draped in the usual sterile fashion.  Vein Location: R brachial vein  Ultrasound Guided  Gauge: 20G  Normal blood return and flush without difficulty Patient tolerance: Patient tolerated the procedure well with no immediate complications.      I personally performed the services described in this documentation, which was scribed in my presence. The recorded information has been reviewed and is accurate.     Elizabeth Balls, MD 08/16/14 [phone]

## 2014-08-16 ENCOUNTER — Inpatient Hospital Stay (HOSPITAL_COMMUNITY): Payer: Medicare Other

## 2014-08-16 ENCOUNTER — Emergency Department (HOSPITAL_COMMUNITY): Payer: Medicare Other

## 2014-08-16 ENCOUNTER — Encounter (HOSPITAL_COMMUNITY): Payer: Self-pay | Admitting: Emergency Medicine

## 2014-08-16 DIAGNOSIS — E1159 Type 2 diabetes mellitus with other circulatory complications: Secondary | ICD-10-CM

## 2014-08-16 DIAGNOSIS — I82291 Chronic embolism and thrombosis of other thoracic veins: Secondary | ICD-10-CM | POA: Diagnosis present

## 2014-08-16 DIAGNOSIS — J948 Other specified pleural conditions: Secondary | ICD-10-CM | POA: Diagnosis not present

## 2014-08-16 DIAGNOSIS — Z992 Dependence on renal dialysis: Secondary | ICD-10-CM

## 2014-08-16 DIAGNOSIS — R1084 Generalized abdominal pain: Secondary | ICD-10-CM | POA: Diagnosis not present

## 2014-08-16 DIAGNOSIS — I82290 Acute embolism and thrombosis of other thoracic veins: Secondary | ICD-10-CM | POA: Diagnosis not present

## 2014-08-16 DIAGNOSIS — I82611 Acute embolism and thrombosis of superficial veins of right upper extremity: Secondary | ICD-10-CM | POA: Diagnosis not present

## 2014-08-16 DIAGNOSIS — N2581 Secondary hyperparathyroidism of renal origin: Secondary | ICD-10-CM | POA: Diagnosis present

## 2014-08-16 DIAGNOSIS — K8020 Calculus of gallbladder without cholecystitis without obstruction: Secondary | ICD-10-CM | POA: Diagnosis not present

## 2014-08-16 DIAGNOSIS — I10 Essential (primary) hypertension: Secondary | ICD-10-CM

## 2014-08-16 DIAGNOSIS — E1129 Type 2 diabetes mellitus with other diabetic kidney complication: Secondary | ICD-10-CM | POA: Diagnosis not present

## 2014-08-16 DIAGNOSIS — D649 Anemia, unspecified: Secondary | ICD-10-CM | POA: Diagnosis present

## 2014-08-16 DIAGNOSIS — I82C22 Chronic embolism and thrombosis of left internal jugular vein: Secondary | ICD-10-CM | POA: Diagnosis present

## 2014-08-16 DIAGNOSIS — J90 Pleural effusion, not elsewhere classified: Secondary | ICD-10-CM | POA: Diagnosis present

## 2014-08-16 DIAGNOSIS — J9811 Atelectasis: Secondary | ICD-10-CM | POA: Diagnosis not present

## 2014-08-16 DIAGNOSIS — I120 Hypertensive chronic kidney disease with stage 5 chronic kidney disease or end stage renal disease: Secondary | ICD-10-CM | POA: Diagnosis present

## 2014-08-16 DIAGNOSIS — J869 Pyothorax without fistula: Secondary | ICD-10-CM | POA: Diagnosis present

## 2014-08-16 DIAGNOSIS — R109 Unspecified abdominal pain: Secondary | ICD-10-CM | POA: Diagnosis present

## 2014-08-16 DIAGNOSIS — K819 Cholecystitis, unspecified: Secondary | ICD-10-CM | POA: Diagnosis not present

## 2014-08-16 DIAGNOSIS — D631 Anemia in chronic kidney disease: Secondary | ICD-10-CM | POA: Diagnosis not present

## 2014-08-16 DIAGNOSIS — J181 Lobar pneumonia, unspecified organism: Secondary | ICD-10-CM | POA: Diagnosis not present

## 2014-08-16 DIAGNOSIS — K219 Gastro-esophageal reflux disease without esophagitis: Secondary | ICD-10-CM | POA: Diagnosis present

## 2014-08-16 DIAGNOSIS — N186 End stage renal disease: Secondary | ICD-10-CM

## 2014-08-16 DIAGNOSIS — R0902 Hypoxemia: Secondary | ICD-10-CM | POA: Insufficient documentation

## 2014-08-16 DIAGNOSIS — Z8673 Personal history of transient ischemic attack (TIA), and cerebral infarction without residual deficits: Secondary | ICD-10-CM | POA: Diagnosis not present

## 2014-08-16 DIAGNOSIS — Z7982 Long term (current) use of aspirin: Secondary | ICD-10-CM | POA: Diagnosis not present

## 2014-08-16 DIAGNOSIS — R0602 Shortness of breath: Secondary | ICD-10-CM | POA: Diagnosis not present

## 2014-08-16 DIAGNOSIS — R079 Chest pain, unspecified: Secondary | ICD-10-CM

## 2014-08-16 DIAGNOSIS — R1011 Right upper quadrant pain: Secondary | ICD-10-CM

## 2014-08-16 DIAGNOSIS — K828 Other specified diseases of gallbladder: Secondary | ICD-10-CM | POA: Diagnosis not present

## 2014-08-16 DIAGNOSIS — E1151 Type 2 diabetes mellitus with diabetic peripheral angiopathy without gangrene: Secondary | ICD-10-CM | POA: Diagnosis present

## 2014-08-16 DIAGNOSIS — Z452 Encounter for adjustment and management of vascular access device: Secondary | ICD-10-CM | POA: Diagnosis not present

## 2014-08-16 DIAGNOSIS — Z7902 Long term (current) use of antithrombotics/antiplatelets: Secondary | ICD-10-CM | POA: Diagnosis not present

## 2014-08-16 DIAGNOSIS — N185 Chronic kidney disease, stage 5: Secondary | ICD-10-CM | POA: Diagnosis not present

## 2014-08-16 LAB — BODY FLUID CELL COUNT WITH DIFFERENTIAL
EOS FL: 1 %
Lymphs, Fluid: 44 %
Monocyte-Macrophage-Serous Fluid: 4 % — ABNORMAL LOW (ref 50–90)
Neutrophil Count, Fluid: 51 % — ABNORMAL HIGH (ref 0–25)
Total Nucleated Cell Count, Fluid: 3133 cu mm — ABNORMAL HIGH (ref 0–1000)

## 2014-08-16 LAB — CBC
HCT: 34 % — ABNORMAL LOW (ref 36.0–46.0)
HEMOGLOBIN: 11.4 g/dL — AB (ref 12.0–15.0)
MCH: 30.5 pg (ref 26.0–34.0)
MCHC: 33.5 g/dL (ref 30.0–36.0)
MCV: 90.9 fL (ref 78.0–100.0)
PLATELETS: 321 10*3/uL (ref 150–400)
RBC: 3.74 MIL/uL — ABNORMAL LOW (ref 3.87–5.11)
RDW: 14.6 % (ref 11.5–15.5)
WBC: 9.9 10*3/uL (ref 4.0–10.5)

## 2014-08-16 LAB — CBC WITH DIFFERENTIAL/PLATELET
BASOS PCT: 1 % (ref 0–1)
Basophils Absolute: 0.1 10*3/uL (ref 0.0–0.1)
Eosinophils Absolute: 0.6 10*3/uL (ref 0.0–0.7)
Eosinophils Relative: 5 % (ref 0–5)
HEMATOCRIT: 37.9 % (ref 36.0–46.0)
HEMOGLOBIN: 12.7 g/dL (ref 12.0–15.0)
LYMPHS ABS: 1.4 10*3/uL (ref 0.7–4.0)
LYMPHS PCT: 12 % (ref 12–46)
MCH: 30.8 pg (ref 26.0–34.0)
MCHC: 33.5 g/dL (ref 30.0–36.0)
MCV: 91.8 fL (ref 78.0–100.0)
MONO ABS: 0.8 10*3/uL (ref 0.1–1.0)
Monocytes Relative: 7 % (ref 3–12)
NEUTROS ABS: 9.1 10*3/uL — AB (ref 1.7–7.7)
NEUTROS PCT: 75 % (ref 43–77)
PLATELETS: 324 10*3/uL (ref 150–400)
RBC: 4.13 MIL/uL (ref 3.87–5.11)
RDW: 14.6 % (ref 11.5–15.5)
WBC: 11.9 10*3/uL — ABNORMAL HIGH (ref 4.0–10.5)

## 2014-08-16 LAB — I-STAT CHEM 8, ED
BUN: 58 mg/dL — ABNORMAL HIGH (ref 6–23)
CHLORIDE: 91 mmol/L — AB (ref 96–112)
CREATININE: 11.4 mg/dL — AB (ref 0.50–1.10)
Calcium, Ion: 0.99 mmol/L — ABNORMAL LOW (ref 1.12–1.23)
Glucose, Bld: 239 mg/dL — ABNORMAL HIGH (ref 70–99)
HCT: 45 % (ref 36.0–46.0)
HEMOGLOBIN: 15.3 g/dL — AB (ref 12.0–15.0)
Potassium: 4.7 mmol/L (ref 3.5–5.1)
Sodium: 129 mmol/L — ABNORMAL LOW (ref 135–145)
TCO2: 24 mmol/L (ref 0–100)

## 2014-08-16 LAB — PROTEIN, BODY FLUID: TOTAL PROTEIN, FLUID: 5.7 g/dL

## 2014-08-16 LAB — I-STAT ARTERIAL BLOOD GAS, ED
Acid-Base Excess: 2 mmol/L (ref 0.0–2.0)
Bicarbonate: 25.9 mEq/L — ABNORMAL HIGH (ref 20.0–24.0)
O2 Saturation: 92 %
PH ART: 7.455 — AB (ref 7.350–7.450)
Patient temperature: 98.6
TCO2: 27 mmol/L (ref 0–100)
pCO2 arterial: 36.8 mmHg (ref 35.0–45.0)
pO2, Arterial: 60 mmHg — ABNORMAL LOW (ref 80.0–100.0)

## 2014-08-16 LAB — LIPASE, BLOOD: Lipase: 46 U/L (ref 11–59)

## 2014-08-16 LAB — COMPREHENSIVE METABOLIC PANEL
ALT: 20 U/L (ref 0–35)
AST: 22 U/L (ref 0–37)
Albumin: 3.2 g/dL — ABNORMAL LOW (ref 3.5–5.2)
Alkaline Phosphatase: 90 U/L (ref 39–117)
Anion gap: 20 — ABNORMAL HIGH (ref 5–15)
BUN: 63 mg/dL — AB (ref 6–23)
CALCIUM: 9.2 mg/dL (ref 8.4–10.5)
CHLORIDE: 85 mmol/L — AB (ref 96–112)
CO2: 24 mmol/L (ref 19–32)
CREATININE: 12.41 mg/dL — AB (ref 0.50–1.10)
GFR calc non Af Amer: 3 mL/min — ABNORMAL LOW (ref >90)
GFR, EST AFRICAN AMERICAN: 4 mL/min — AB (ref >90)
GLUCOSE: 233 mg/dL — AB (ref 70–99)
Potassium: 4.7 mmol/L (ref 3.5–5.1)
Sodium: 129 mmol/L — ABNORMAL LOW (ref 135–145)
Total Bilirubin: 0.6 mg/dL (ref 0.3–1.2)
Total Protein: 8.8 g/dL — ABNORMAL HIGH (ref 6.0–8.3)

## 2014-08-16 LAB — RENAL FUNCTION PANEL
ANION GAP: 18 — AB (ref 5–15)
Albumin: 2.7 g/dL — ABNORMAL LOW (ref 3.5–5.2)
BUN: 75 mg/dL — AB (ref 6–23)
CALCIUM: 8.4 mg/dL (ref 8.4–10.5)
CO2: 24 mmol/L (ref 19–32)
Chloride: 88 mmol/L — ABNORMAL LOW (ref 96–112)
Creatinine, Ser: 13.51 mg/dL — ABNORMAL HIGH (ref 0.50–1.10)
GFR calc Af Amer: 3 mL/min — ABNORMAL LOW (ref >90)
GFR calc non Af Amer: 3 mL/min — ABNORMAL LOW (ref >90)
Glucose, Bld: 166 mg/dL — ABNORMAL HIGH (ref 70–99)
Phosphorus: 6.4 mg/dL — ABNORMAL HIGH (ref 2.3–4.6)
Potassium: 4.7 mmol/L (ref 3.5–5.1)
Sodium: 130 mmol/L — ABNORMAL LOW (ref 135–145)

## 2014-08-16 LAB — I-STAT TROPONIN, ED: Troponin i, poc: 0.03 ng/mL (ref 0.00–0.08)

## 2014-08-16 LAB — GLUCOSE, SEROUS FLUID: GLUCOSE FL: 218 mg/dL

## 2014-08-16 LAB — LACTATE DEHYDROGENASE, PLEURAL OR PERITONEAL FLUID: LD FL: 171 U/L — AB (ref 3–23)

## 2014-08-16 LAB — GLUCOSE, CAPILLARY
GLUCOSE-CAPILLARY: 171 mg/dL — AB (ref 70–99)
Glucose-Capillary: 198 mg/dL — ABNORMAL HIGH (ref 70–99)
Glucose-Capillary: 148 mg/dL — ABNORMAL HIGH (ref 70–99)

## 2014-08-16 LAB — PROTIME-INR
INR: 1.22 (ref 0.00–1.49)
PROTHROMBIN TIME: 15.5 s — AB (ref 11.6–15.2)

## 2014-08-16 LAB — I-STAT CG4 LACTIC ACID, ED: Lactic Acid, Venous: 1.82 mmol/L (ref 0.5–2.0)

## 2014-08-16 MED ORDER — VANCOMYCIN HCL IN DEXTROSE 750-5 MG/150ML-% IV SOLN
750.0000 mg | INTRAVENOUS | Status: DC
  Administered 2014-08-16 – 2014-08-20 (×3): 750 mg via INTRAVENOUS
  Filled 2014-08-16 (×6): qty 150

## 2014-08-16 MED ORDER — LIDOCAINE HCL (PF) 1 % IJ SOLN
INTRAMUSCULAR | Status: AC
  Filled 2014-08-16: qty 10

## 2014-08-16 MED ORDER — BACITRACIN-NEOMYCIN-POLYMYXIN OINTMENT TUBE
1.0000 "application " | TOPICAL_OINTMENT | Freq: Every day | CUTANEOUS | Status: DC
  Administered 2014-08-16 – 2014-08-21 (×3): 1 via TOPICAL
  Filled 2014-08-16: qty 15

## 2014-08-16 MED ORDER — DIPHENHYDRAMINE HCL 25 MG PO CAPS
25.0000 mg | ORAL_CAPSULE | Freq: Four times a day (QID) | ORAL | Status: DC | PRN
  Filled 2014-08-16: qty 1

## 2014-08-16 MED ORDER — PIPERACILLIN-TAZOBACTAM IN DEX 2-0.25 GM/50ML IV SOLN
2.2500 g | Freq: Three times a day (TID) | INTRAVENOUS | Status: DC
  Administered 2014-08-16 – 2014-08-21 (×11): 2.25 g via INTRAVENOUS
  Filled 2014-08-16 (×22): qty 50

## 2014-08-16 MED ORDER — CLOPIDOGREL BISULFATE 75 MG PO TABS
75.0000 mg | ORAL_TABLET | Freq: Every day | ORAL | Status: DC
  Administered 2014-08-16 – 2014-08-21 (×6): 75 mg via ORAL
  Filled 2014-08-16 (×6): qty 1

## 2014-08-16 MED ORDER — TRAMADOL HCL 50 MG PO TABS
50.0000 mg | ORAL_TABLET | Freq: Two times a day (BID) | ORAL | Status: DC | PRN
  Administered 2014-08-16 – 2014-08-20 (×3): 50 mg via ORAL
  Filled 2014-08-16 (×3): qty 1

## 2014-08-16 MED ORDER — ACETAMINOPHEN 325 MG PO TABS: 325.0000 mg | ORAL_TABLET | ORAL | Status: DC | PRN

## 2014-08-16 MED ORDER — VANCOMYCIN HCL 10 G IV SOLR
1500.0000 mg | INTRAVENOUS | Status: AC
  Administered 2014-08-16: 1500 mg via INTRAVENOUS
  Filled 2014-08-16: qty 1500

## 2014-08-16 MED ORDER — SODIUM CHLORIDE 0.9 % IV BOLUS (SEPSIS)
500.0000 mL | Freq: Once | INTRAVENOUS | Status: DC
Start: 2014-08-16 — End: 2014-08-21

## 2014-08-16 MED ORDER — ACETAMINOPHEN 325 MG PO TABS: 325.0000 mg | ORAL_TABLET | ORAL | Status: DC

## 2014-08-16 MED ORDER — MORPHINE SULFATE 4 MG/ML IJ SOLN
6.0000 mg | Freq: Once | INTRAMUSCULAR | Status: DC
  Filled 2014-08-16: qty 2

## 2014-08-16 MED ORDER — ONDANSETRON HCL 4 MG/2ML IJ SOLN
4.0000 mg | Freq: Once | INTRAMUSCULAR | Status: AC
  Administered 2014-08-16: 4 mg via INTRAVENOUS
  Filled 2014-08-16: qty 2

## 2014-08-16 MED ORDER — LIDOCAINE HCL 1 % IJ SOLN
INTRAMUSCULAR | Status: AC
  Filled 2014-08-16: qty 20

## 2014-08-16 MED ORDER — ASPIRIN EC 81 MG PO TBEC
81.0000 mg | DELAYED_RELEASE_TABLET | Freq: Every day | ORAL | Status: DC
Start: 2014-08-16 — End: 2014-08-19
  Administered 2014-08-16 – 2014-08-19 (×4): 81 mg via ORAL
  Filled 2014-08-16 (×4): qty 1

## 2014-08-16 MED ORDER — IOHEXOL 300 MG/ML  SOLN
100.0000 mL | Freq: Once | INTRAMUSCULAR | Status: AC | PRN
  Administered 2014-08-16: 80 mL via INTRAVENOUS

## 2014-08-16 MED ORDER — GLIPIZIDE 5 MG PO TABS
5.0000 mg | ORAL_TABLET | Freq: Every day | ORAL | Status: DC
  Administered 2014-08-16: 5 mg via ORAL
  Filled 2014-08-16 (×2): qty 1

## 2014-08-16 MED ORDER — AMLODIPINE BESYLATE 10 MG PO TABS
10.0000 mg | ORAL_TABLET | Freq: Every day | ORAL | Status: DC
  Administered 2014-08-16 – 2014-08-20 (×5): 10 mg via ORAL
  Filled 2014-08-16 (×6): qty 1

## 2014-08-16 MED ORDER — RENA-VITE PO TABS
1.0000 | ORAL_TABLET | Freq: Every day | ORAL | Status: DC
  Administered 2014-08-16 – 2014-08-21 (×6): 1 via ORAL
  Filled 2014-08-16 (×6): qty 1

## 2014-08-16 NOTE — Procedures (Signed)
Successful US guided right thoracentesis. Yielded 600 ml of yellow fluid. Pt tolerated procedure well. No immediate complications. Procedure stopped early secondary to pain, remaining pleural fluid seen.  Specimen was sent for labs. CXR ordered.  Tsosie Billing D PA-C 08/16/2014 10:22 AM

## 2014-08-16 NOTE — Progress Notes (Signed)
Hemodialysis- Report called to 2W. Picked up from hemo by primary RN (thank you). Vitals stable post HD. Unable to achieve 3.5L goal d/t bp drop. Pt has no complaints. Dressings clean dry and intact.

## 2014-08-16 NOTE — Progress Notes (Signed)
Utilization review completed. Darel Ricketts, RN, BSN. 

## 2014-08-16 NOTE — Consult Note (Signed)
[facility] Surgery Consult Note  [name] 1961/02/01  [id number].    Requesting MD: Dr. Tana Coast Chief Complaint/Reason for Consult: Abdominal pain  HPI:  54 y/o AA female with h/o ESRD, dialysis MWF, presents to the ED with c/o right sided lower chest pain, right upper abdominal pain, which she describes as "gas pains". She also c/o back mid right sided back pain which radiates around to the front.  She also c/o SOB/dyspnea.  Symptoms have been ongoing for the past couple of days.  Pain is precipitated by movement and feels like something is catching.  She tried rolaids without relief. She was found to have a large right sided pleural effusion by CT chest, IR completed pleuracentesis and her symptoms have since improved.  She denies RUQ pain 0/10 pain, but still c/o right mid back pain.    ROS: All systems reviewed and otherwise negative except for as above  Family History  Problem Relation Age of Onset  . Hypertension Father   . Kidney disease Mother     Past Medical History  Diagnosis Date  . ESRD (end stage renal disease)   . Hypertension   . CVA (cerebral infarction)     right parietal 05/19/2000  . Vaginal bleeding   . Hyperparathyroidism   . Pneumonia   . Stroke     2002,no residual  . Peripheral vascular disease   . GERD (gastroesophageal reflux disease)   . Anemia   . DM (diabetes mellitus) type II controlled peripheral vascular disorder   . Slip/trip w/o falling due to stepping on object, subs July  2014    Past Surgical History  Procedure Laterality Date  . Parathyroidectomy with autotransplant  12/07/2010  . Knee surgery      right, x2  . Brachial artery graft      x2 for dialysis  . Av fistula placement      upper rt thigh  . Dental extractions Bilateral   . Dilation and curettage of uterus      hx: of 1986  . 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 Leg surgery Right   . Foot surgery Left     5 TOES AMPUTATED  . Abdominal aortagram N/A 05/27/2012    Procedure: ABDOMINAL Maxcine Ham;  Surgeon: Serafina Mitchell, MD;  Location: [facility] CATH LAB;  Service: Cardiovascular;  Laterality: N/A;  . Lower extremity angiogram Left 05/27/2012    Procedure: LOWER EXTREMITY ANGIOGRAM;  Surgeon: Serafina Mitchell, MD;  Location: [facility] CATH LAB;  Service: Cardiovascular;  Laterality: Left;  lt leg angio    Social History:  reports that she has never smoked. She has never used smokeless tobacco. She reports that she drinks alcohol. She reports that she does not use illicit drugs.  Allergies:  Allergies  Allergen Reactions  . Doxycycline Itching  . Peroxyl [Hydrogen Peroxide] Hives and Rash    Medications Prior to Admission  Medication Sig Dispense Refill  . Acetaminophen (TYLENOL PO) Take 325-650 mg by mouth See admin instructions. Uses at dialysis Mon, Wed, Fridays each week for muscle spasms    . amLODipine (NORVASC) 10 MG tablet Take 0.5 tablets (5 mg total) by mouth at bedtime. (Patient taking differently: Take 10 mg by mouth at bedtime. ) 30 tablet 1  . aspirin 81 MG tablet Take 1 tablet (81 mg total) by mouth daily. 90 tablet 3  .  CALCIUM-VITAMIN D PO Take 1 tablet by mouth 3 (three) times daily with meals.    . Clopidogrel Bisulfate (PLAVIX PO) Take 75 mg by mouth daily.     . Cyanocobalamin (VITAMIN B-12 PO) Take 1 tablet by mouth 4 (four) times a week. Non dialysis days. Tues, Thurs, Sat and Sunday    . diphenhydrAMINE (BENADRYL) 25 MG tablet Take 25 mg by mouth every 6 (six) hours as needed (Muscle spasm in feet).    Marland Kitchen glipiZIDE (GLUCOTROL) 10 MG tablet Take 0.5 tablets (5 mg total) by mouth daily. 45 tablet 3  . multivitamin (RENA-VIT) TABS tablet Take 1 tablet by mouth daily.    Marland Kitchen neomycin-bacitracin-polymyxin (NEOSPORIN) [phone] ointment Apply 1 application topically daily. Apply to foot    . traMADol  (ULTRAM) 50 MG tablet Take 50 mg by mouth 2 (two) times daily as needed for moderate pain (for foot pain). For foot pain  0    Blood pressure 132/78, pulse 87, temperature 99.9 F (37.7 C), temperature source Oral, resp. rate 23, height _0  (1.575 m), weight 71.5 kg (157 lb 10.1 oz), SpO2 100 %. Physical Exam: General: pleasant, WD/WN AA female who is laying in bed in NAD HEENT: head is normocephalic, atraumatic.  Sclera are noninjected.  PERRL.  Ears and nose without any masses or lesions.  Mouth is pink and moist Heart: regular, rate, and rhythm.  No obvious murmurs, gallops, or rubs noted.  Palpable pedal pulses bilaterally Lungs: CTAB, no wheezes, rhonchi, or rales noted.  Respiratory effort nonlabored, on [place] O2, diminished effort. Abd: soft, NT/ND, +BS, no masses, hernias, or organomegaly MS: all 4 extremities distal CSM intact, left foot partial amputation Skin: warm and dry with no masses, lesions, or rashes Psych: A&Ox3 with an appropriate affect. Neuro: Very slow to answer, however speech is fluent, education level seems greatly diminished  Results for orders placed or performed during the hospital encounter of 08/15/14 (from the past 48 hour(s))  CBC with Differential/Platelet     Status: Abnormal   Collection Time: 08/16/14  1:12 AM  Result Value Ref Range   WBC 11.9 (H) 4.0 - 10.5 K/uL   RBC 4.13 3.87 - 5.11 MIL/uL   Hemoglobin 12.7 12.0 - 15.0 g/dL   HCT 37.9 36.0 - 46.0 %   MCV 91.8 78.0 - 100.0 fL   MCH 30.8 26.0 - 34.0 pg   MCHC 33.5 30.0 - 36.0 g/dL   RDW 14.6 11.5 - 15.5 %   Platelets 324 150 - 400 K/uL   Neutrophils Relative % 75 43 - 77 %   Neutro Abs 9.1 (H) 1.7 - 7.7 K/uL   Lymphocytes Relative 12 12 - 46 %   Lymphs Abs 1.4 0.7 - 4.0 K/uL   Monocytes Relative 7 3 - 12 %   Monocytes Absolute 0.8 0.1 - 1.0 K/uL   Eosinophils Relative 5 0 - 5 %   Eosinophils Absolute 0.6 0.0 - 0.7 K/uL   Basophils Relative 1 0 - 1 %   Basophils Absolute 0.1 0.0 - 0.1 K/uL   Comprehensive metabolic panel     Status: Abnormal   Collection Time: 08/16/14  1:12 AM  Result Value Ref Range   Sodium 129 (L) 135 - 145 mmol/L   Potassium 4.7 3.5 - 5.1 mmol/L   Chloride 85 (L) 96 - 112 mmol/L   CO2 24 19 - 32 mmol/L   Glucose, Bld 233 (H) 70 - 99 mg/dL   BUN 63 (H) 6 -  23 mg/dL   Creatinine, Ser 12.41 (H) 0.50 - 1.10 mg/dL   Calcium 9.2 8.4 - 10.5 mg/dL   Total Protein 8.8 (H) 6.0 - 8.3 g/dL   Albumin 3.2 (L) 3.5 - 5.2 g/dL   AST 22 0 - 37 U/L   ALT 20 0 - 35 U/L   Alkaline Phosphatase 90 39 - 117 U/L   Total Bilirubin 0.6 0.3 - 1.2 mg/dL   GFR calc non Af Amer 3 (L) >90 mL/min   GFR calc Af Amer 4 (L) >90 mL/min    Comment: (NOTE) The eGFR has been calculated using the CKD EPI equation. This calculation has not been validated in all clinical situations. eGFR's persistently <90 mL/min signify possible Chronic Kidney Disease.    Anion gap 20 (H) 5 - 15  Lipase, blood     Status: None   Collection Time: 08/16/14  1:12 AM  Result Value Ref Range   Lipase 46 11 - 59 U/L  Protime-INR     Status: Abnormal   Collection Time: 08/16/14  1:12 AM  Result Value Ref Range   Prothrombin Time 15.5 (H) 11.6 - 15.2 seconds   INR 1.22 0.00 - 1.49  I-stat troponin, ED     Status: None   Collection Time: 08/16/14  1:20 AM  Result Value Ref Range   Troponin i, poc 0.03 0.00 - 0.08 ng/mL   Comment 3            Comment: Due to the release kinetics of cTnI, a negative result within the first hours of the onset of symptoms does not rule out myocardial infarction with certainty. If myocardial infarction is still suspected, repeat the test at appropriate intervals.   I-stat chem 8, ed     Status: Abnormal   Collection Time: 08/16/14  1:21 AM  Result Value Ref Range   Sodium 129 (L) 135 - 145 mmol/L   Potassium 4.7 3.5 - 5.1 mmol/L   Chloride 91 (L) 96 - 112 mmol/L   BUN 58 (H) 6 - 23 mg/dL   Creatinine, Ser 11.40 (H) 0.50 - 1.10 mg/dL   Glucose, Bld 239 (H) 70 - 99  mg/dL   Calcium, Ion 0.99 (L) 1.12 - 1.23 mmol/L   TCO2 24 0 - 100 mmol/L   Hemoglobin 15.3 (H) 12.0 - 15.0 g/dL   HCT 45.0 36.0 - 46.0 %  I-Stat arterial blood gas, ED     Status: Abnormal   Collection Time: 08/16/14  1:21 AM  Result Value Ref Range   pH, Arterial 7.455 (H) 7.350 - 7.450   pCO2 arterial 36.8 35.0 - 45.0 mmHg   pO2, Arterial 60.0 (L) 80.0 - 100.0 mmHg   Bicarbonate 25.9 (H) 20.0 - 24.0 mEq/L   TCO2 27 0 - 100 mmol/L   O2 Saturation 92.0 %   Acid-Base Excess 2.0 0.0 - 2.0 mmol/L   Patient temperature 98.6 F    Collection site RADIAL, ALLEN'S TEST ACCEPTABLE    Drawn by RT    Sample type ARTERIAL   I-Stat CG4 Lactic Acid, ED     Status: None   Collection Time: 08/16/14  1:23 AM  Result Value Ref Range   Lactic Acid, Venous 1.82 0.5 - 2.0 mmol/L  Glucose, capillary     Status: Abnormal   Collection Time: 08/16/14  6:51 AM  Result Value Ref Range   Glucose-Capillary 198 (H) 70 - 99 mg/dL  Lactate dehydrogenase, pleural or peritoneal fluid  Status: Abnormal   Collection Time: 08/16/14 10:16 AM  Result Value Ref Range   LD, Fluid 171 (H) 3 - 23 U/L    Comment: (NOTE) Results should be evaluated in conjunction with serum values    Fluid Type-FLDH FLUID     Comment: PLEURAL RIGHT CORRECTED ON 04/25 AT 1034: PREVIOUSLY REPORTED AS Pleural R   Protein, pleural or peritoneal fluid     Status: None   Collection Time: 08/16/14 10:16 AM  Result Value Ref Range   Total protein, fluid 5.7 g/dL    Comment: (NOTE) No normal range established for this test Results should be evaluated in conjunction with serum values    Fluid Type-FTP FLUID     Comment: PLEURAL RIGHT CORRECTED ON 04/25 AT 1034: PREVIOUSLY REPORTED AS Pleural R   Body fluid cell count with differential     Status: Abnormal   Collection Time: 08/16/14 10:16 AM  Result Value Ref Range   Fluid Type-FCT FLUID     Comment: PLEURAL RIGHT CORRECTED ON 04/25 AT 1033: PREVIOUSLY REPORTED AS Pleural R     Color, Fluid YELLOW YELLOW   Appearance, Fluid CLOUDY (A) CLEAR   WBC, Fluid 3133 (H) 0 - 1000 cu mm   Neutrophil Count, Fluid 51 (H) 0 - 25 %   Lymphs, Fluid 44 %   Monocyte-Macrophage-Serous Fluid 4 (L) 50 - 90 %   Eos, Fluid 1 %  Glucose, pleural or peritoneal fluid     Status: None   Collection Time: 08/16/14 10:16 AM  Result Value Ref Range   Glucose, Fluid 218 mg/dL    Comment: (NOTE) No normal range established for this test Results should be evaluated in conjunction with serum values    Fluid Type-FGLU FLUID     Comment: PLEURAL RIGHT CORRECTED ON 04/25 AT 1035: PREVIOUSLY REPORTED AS Pleural R   Glucose, capillary     Status: Abnormal   Collection Time: 08/16/14 11:49 AM  Result Value Ref Range   Glucose-Capillary 171 (H) 70 - 99 mg/dL   Comment 1 Notify RN    Dg Chest 1 View  08/16/2014   CLINICAL DATA:  Thoracentesis.  EXAM: CHEST  1 VIEW  COMPARISON:  08/16/2014  FINDINGS: Large right pleural effusion is modestly decreased from previous. The underlying lung is opacified.  Unchanged cardiomegaly and vascular pedicle widening. No pulmonary edema no in the left lung. No overt re-expansion edema in the right lung.  IMPRESSION: Decreased but still large right pleural effusion.  No pneumothorax.   Electronically Signed   By: Monte Fantasia M.D.   On: 08/16/2014 10:49   Dg Chest 2 View  08/16/2014   CLINICAL DATA:  54 year old female with several day history of chest and back pain accompanied by shortness of breath  EXAM: CHEST  2 VIEW  COMPARISON:  Prior chest x-ray 02/25/2014  FINDINGS: Large right layering pleural effusion with associated dense opacification of the right mid and lower chest. Mild pulmonary vascular congestion without edema. Cardiac and mediastinal contours are grossly within normal limits. No focal airspace consolidation in the left lung. No acute osseous abnormality.  IMPRESSION: 1. Large right layering pleural effusion with associated dense right mid and  basilar airspace opacity. Findings are favored to reflect pleural fluid with atelectasis. Superimposed infiltrate/ pneumonia and mass are difficult to exclude radiographically. 2. Consider further evaluation with CT scan of the chest with contrast.   Electronically Signed   By: Jacqulynn Cadet M.D.   On: 08/16/2014 02:24  Ct Chest W Contrast  08/16/2014   CLINICAL DATA:  Acute onset of right-sided chest pain and upper abdominal pain. Shortness of breath. Pleural effusion noted on chest radiograph. Initial encounter.  EXAM: CT CHEST WITH CONTRAST  TECHNIQUE: Multidetector CT imaging of the chest was performed during intravenous contrast administration.  CONTRAST:  78m OMNIPAQUE IOHEXOL 300 MG/ML  SOLN  COMPARISON:  Chest radiograph performed earlier today at 12:40 a.m., and CTA of the chest performed 05/07/2009  FINDINGS: There is a large loculated right-sided pleural effusion, with consolidation of much of the right lung. Minimal left basilar atelectasis is noted. No definite mass is seen. There is slightly unusual loculation of fluid along the right side of the mediastinum, without definite evidence of a pleural rind. No pneumothorax is seen.  The mediastinum is unremarkable in appearance. No mediastinal lymphadenopathy is seen. No pericardial effusion is identified. The great vessels are grossly unremarkable in appearance. The thyroid gland is unremarkable. No axillary lymphadenopathy is seen.  The visualized portions of the liver and spleen are unremarkable in appearance. There is mild wall thickening and soft tissue inflammation about the gallbladder, raising concern for mild acute cholecystitis. A few stones are seen within the gallbladder. The visualized portions of the pancreas and adrenal glands are unremarkable. Bilateral renal atrophy is noted.  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  No acute osseous abnormalities are identified.   IMPRESSION: 1. Mild wall thickening and soft tissue inflammation about the gallbladder, raising concern for mild acute cholecystitis. Would correlate for associated symptoms. 2. Underlying cholelithiasis noted. 3. Large loculated right-sided pleural effusion, with consolidation of much of the right lung. Minimal left basilar atelectasis noted. No definite mass is seen. Diagnostic thoracentesis could be considered for further evaluation, as deemed clinically appropriate. 4. Bilateral renal atrophy noted. 5. Calcification along the superior mesenteric artery, with mural thrombus and luminal narrowing.   Electronically Signed   By: JGarald BaldingM.D.   On: 08/16/2014 04:58   [place]oracentesis Asp Pleural Space W/img Guide  08/16/2014   INDICATION: Symptomatic right sided pleural effusion  EXAM: [place]ORACENTESIS ASP PLEURAL SPACE W/IMG GUIDE  COMPARISON:  Chest CT 08/16/14.  MEDICATIONS: None  COMPLICATIONS: None immediate  TECHNIQUE: Informed written consent was obtained from the patient after a discussion of the risks, benefits and alternatives to treatment. A timeout was performed prior to the initiation of the procedure.  Initial ultrasound scanning demonstrates a right pleural effusion. The lower chest was prepped and draped in the usual sterile fashion. 1% lidocaine was used for local anesthesia.  An ultrasound image was saved for documentation purposes. A 6 Fr Safe-T-Centesis catheter was introduced. The thoracentesis was performed. The catheter was removed and a dressing was applied. The patient tolerated the procedure well without immediate post procedural complication. The patient was escorted to have an upright chest radiograph. Procedure was stopped early secondary to pain with remaining pleural fluid seen.  FINDINGS: A total of approximately 600 ml of yellow fluid was removed. Requested samples were sent to the laboratory.  IMPRESSION: Successful ultrasound-guided right sided thoracentesis yielding 600 ml  of pleural fluid.  Read By:  KTsosie BillingPA-C   Electronically Signed   By: GAletta EdouardM.D.   On: 08/16/2014 11:05      Assessment/Plan Cholelithiasis Gallbladder edema -CT CHEST done and worrisome for cholecystitis in the setting of accompanying symptoms.  The patient is asymptomatic.  No RUQ abdominal pain, no N/V.  No precipitating food's.  Pt's LFT's are completely normal, her WBC was mildly elevated at 11.9, but this is likely explained by her pleural effusion and ?empyema.  Having a right pleural effusion can often make the gallbladder secondarily look edematous.   -Can get [place] to rule out acute cholecystitis, but may be falsely positive as well -Her "gas pains" are already improved after thoracentesis -Can likely have clears if [place] okay, and advance as tolerated Loculated right pleural effusion with consolidation -s/p thoracentesis, results pending -On vanc/zosyn    Coralie Keens, [facility] Surgery 08/16/2014, 1:04 PM Pager: [phone]

## 2014-08-16 NOTE — Progress Notes (Signed)
ANTIBIOTIC CONSULT NOTE - INITIAL  Pharmacy Consult for Vancomycin and Zosyn Indication: loculated pleural effusion  Allergies  Allergen Reactions  . Doxycycline Itching  . Peroxyl [Hydrogen Peroxide] Hives and Rash    Patient Measurements: Height: 5\' 2"  (157.5 cm) Weight: 157 lb 10.1 oz (71.5 kg) IBW/kg (Calculated) : 50.1  Vital Signs: Temp: 99.9 F (37.7 C) (04/25 0504) Temp Source: Oral (04/25 0504) BP: 132/78 mmHg (04/25 1009) Pulse Rate: 87 (04/25 1009) Intake/Output from previous day:   Intake/Output from this shift: Total I/O In: 120 [P.O.:120] Out: -   Labs:  Recent Labs  08/16/14 0112 08/16/14 0121  WBC 11.9*  --   HGB 12.7 15.3*  PLT 324  --   CREATININE 12.41* 11.40*   Estimated Creatinine Clearance: 5.4 mL/min (by C-G formula based on Cr of 11.4). No results for input(s): VANCOTROUGH, VANCOPEAK, VANCORANDOM, GENTTROUGH, GENTPEAK, GENTRANDOM, TOBRATROUGH, TOBRAPEAK, TOBRARND, AMIKACINPEAK, AMIKACINTROU, AMIKACIN in the last 72 hours.   Microbiology: No results found for this or any previous visit (from the past 720 hour(s)).  Medical History: Past Medical History  Diagnosis Date  . ESRD (end stage renal disease)   . Hypertension   . CVA (cerebral infarction)     right parietal 05/19/2000  . Vaginal bleeding   . Hyperparathyroidism   . Pneumonia   . Stroke     2002,no residual  . Peripheral vascular disease   . GERD (gastroesophageal reflux disease)   . Anemia   . DM (diabetes mellitus) type II controlled peripheral vascular disorder   . Slip/trip w/o falling due to stepping on object, subs July  2014    Medications:  Scheduled:  . amLODipine  10 mg Oral QHS  . aspirin EC  81 mg Oral Daily  . clopidogrel  75 mg Oral Daily  . lidocaine (PF)      . multivitamin  1 tablet Oral Daily  . neomycin-bacitracin-polymyxin  1 application Topical Daily  . sodium chloride  500 mL Intravenous Once   Assessment: 54 yo F presented to ED 4/24 with  complaints of R sided CP and upper abd pain for the last few days.  CT scan showed R sided pleural effusion and GB wall thickening concerning for acute cholecystitis.    Pt went to IR this morning US drainage of R pleural effusion with removal of 649ml of fluid.  Fluid sent for cx.  Now to start empiric abx.  Pt has hx of ESRD with HD on MWF.  Will plan for Vancomycin loading dose this morning since it does not appear that patient is going to dialysis until later today.  Pt will need repeat Vancomycin dose after HD today.  Goal of Therapy:  Vancomycin pre-HD level 20-25 mcg/ml Renal dose adjustment of antibiotics  Plan:  Vancomycin 1500mg  IV x 1 now Zosyn 2.25gm IV q8h - first dose now Vancomycin 750 mg IV qHD (first dose today after HD) Follow up cx data and clinical progress Check drug levels as indicated  [employer], Pharm.D., BCPS Clinical Pharmacist Pager [phone] 08/16/2014 11:32 AM

## 2014-08-16 NOTE — H&P (Signed)
[facility] History and Physical  [name] UEA:[id number] DOB: Oct 30, 1960 DOA: 08/15/2014  Referring physician: EDP PCP: Chari Manning, NP   Chief Complaint: Abdominal pain   HPI: [name] is a 54 y.o. female with h/o ESRD, dialysis MWF, presents to the ED with c/o right sided chest pain, upper abdominal pain, which she describes as "gas".  Symptoms have been ongoing for the past couple of days.  She took rolaids earlier today without relief.  She has also been SOB since earlier today.  Review of Systems: Systems reviewed.  As above, otherwise negative  Past Medical History  Diagnosis Date  . ESRD (end stage renal disease)   . Hypertension   . CVA (cerebral infarction)     right parietal 05/19/2000  . Vaginal bleeding   . Hyperparathyroidism   . Pneumonia   . Stroke     2002,no residual  . Peripheral vascular disease   . GERD (gastroesophageal reflux disease)   . Anemia   . DM (diabetes mellitus) type II controlled peripheral vascular disorder   . Slip/trip w/o falling due to stepping on object, subs July  2014   Past Surgical History  Procedure Laterality Date  . Parathyroidectomy with autotransplant  12/07/2010  . Knee surgery      right, x2  . Brachial artery graft      x2 for dialysis  . Av fistula placement      upper rt thigh  . Dental extractions Bilateral   . Dilation and curettage of uterus      hx: of 1986  . 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 Leg surgery Right   . Foot surgery Left     5 TOES AMPUTATED  . Abdominal aortagram N/A 05/27/2012    Procedure: ABDOMINAL Maxcine Ham;  Surgeon: Serafina Mitchell, MD;  Location: [facility] CATH LAB;  Service: Cardiovascular;  Laterality: N/A;  . Lower extremity angiogram Left 05/27/2012    Procedure: LOWER EXTREMITY ANGIOGRAM;  Surgeon: Serafina Mitchell, MD;  Location: [facility]  CATH LAB;  Service: Cardiovascular;  Laterality: Left;  lt leg angio   Social History:  reports that she has never smoked. She has never used smokeless tobacco. She reports that she drinks alcohol. She reports that she does not use illicit drugs.  Allergies  Allergen Reactions  . Doxycycline Itching  . Peroxyl [Hydrogen Peroxide] Hives and Rash    Family History  Problem Relation Age of Onset  . Hypertension Father   . Kidney disease Mother      Prior to Admission medications   Medication Sig Start Date End Date Taking? Authorizing Provider  Acetaminophen (TYLENOL PO) Take 325-650 mg by mouth See admin instructions. Uses at dialysis Mon, Wed, Fridays each week for muscle spasms   Yes Historical Provider, MD  amLODipine (NORVASC) 10 MG tablet Take 0.5 tablets (5 mg total) by mouth at bedtime. Patient taking differently: Take 10 mg by mouth at bedtime.  01/19/14  Yes Lance Bosch, NP  aspirin 81 MG tablet Take 1 tablet (81 mg total) by mouth daily. 01/19/14  Yes Lance Bosch, NP  CALCIUM-VITAMIN D PO Take 1 tablet by mouth 3 (three) times daily with meals.   Yes Historical Provider, MD  Clopidogrel Bisulfate (PLAVIX PO) Take 75 mg by mouth daily.    Yes Historical Provider, MD  Cyanocobalamin (VITAMIN B-12 PO) Take 1  tablet by mouth 4 (four) times a week. Non dialysis days. Cain Saupe, Sat and Sunday   Yes Historical Provider, MD  diphenhydrAMINE (BENADRYL) 25 MG tablet Take 25 mg by mouth every 6 (six) hours as needed (Muscle spasm in feet).   Yes Historical Provider, MD  glipiZIDE (GLUCOTROL) 10 MG tablet Take 0.5 tablets (5 mg total) by mouth daily. 01/19/14  Yes Lance Bosch, NP  multivitamin (RENA-VIT) TABS tablet Take 1 tablet by mouth daily.   Yes Historical Provider, MD  neomycin-bacitracin-polymyxin (NEOSPORIN) [phone] ointment Apply 1 application topically daily. Apply to foot   Yes Historical Provider, MD  traMADol (ULTRAM) 50 MG tablet Take 50 mg by mouth 2 (two) times  daily as needed for moderate pain (for foot pain). For foot pain 07/26/14  Yes Historical Provider, MD   Physical Exam: Filed Vitals:   08/16/14 0230  BP: 174/78  Pulse: 116  Temp:   Resp: 26    BP 174/78 mmHg  Pulse 116  Temp(Src) 98.7 F (37.1 C) (Oral)  Resp 26  Ht 5\' 2"  (1.575 m)  Wt 74.844 kg (165 lb)  BMI 30.17 kg/m2  SpO2 95%  General Appearance:    Alert, oriented, no distress, appears stated age  Head:    Normocephalic, atraumatic  Eyes:    PERRL, EOMI, sclera non-icteric        Nose:   Nares without drainage or epistaxis. Mucosa, turbinates normal  Throat:   Moist mucous membranes. Oropharynx without erythema or exudate.  Neck:   Supple. No carotid bruits.  No thyromegaly.  No lymphadenopathy.   Back:     No CVA tenderness, no spinal tenderness  Lungs:     Absent in R lower and R middle lung fields, clear on the left.  Chest wall:    No tenderness to palpitation  Heart:    Regular rate and rhythm without murmurs, gallops, rubs  Abdomen:     Soft, non-tender, nondistended, normal bowel sounds, no organomegaly  Genitalia:    deferred  Rectal:    deferred  Extremities:   No clubbing, cyanosis or edema.  Pulses:   2+ and symmetric all extremities  Skin:   Skin color, texture, turgor normal, no rashes or lesions  Lymph nodes:   Cervical, supraclavicular, and axillary nodes normal  Neurologic:   CNII-XII intact. Normal strength, sensation and reflexes      throughout    Labs on Admission:  Basic Metabolic Panel:  Recent Labs Lab 08/16/14 0112 08/16/14 0121  NA 129* 129*  K 4.7 4.7  CL 85* 91*  CO2 24  --   GLUCOSE 233* 239*  BUN 63* 58*  CREATININE 12.41* 11.40*  CALCIUM 9.2  --    Liver Function Tests:  Recent Labs Lab 08/16/14 0112  AST 22  ALT 20  ALKPHOS 90  BILITOT 0.6  PROT 8.8*  ALBUMIN 3.2*    Recent Labs Lab 08/16/14 0112  LIPASE 46   No results for input(s): AMMONIA in the last 168 hours. CBC:  Recent Labs Lab 08/16/14 0112  08/16/14 0121  WBC 11.9*  --   NEUTROABS 9.1*  --   HGB 12.7 15.3*  HCT 37.9 45.0  MCV 91.8  --   PLT 324  --    Cardiac Enzymes: No results for input(s): CKTOTAL, CKMB, CKMBINDEX, TROPONINI in the last 168 hours.  BNP (last 3 results) No results for input(s): PROBNP in the last 8760 hours. CBG: No results for input(s): GLUCAP in  the last 168 hours.  Radiological Exams on Admission: Dg Chest 2 View  08/16/2014   CLINICAL DATA:  54 year old female with several day history of chest and back pain accompanied by shortness of breath  EXAM: CHEST  2 VIEW  COMPARISON:  Prior chest x-ray 02/25/2014  FINDINGS: Large right layering pleural effusion with associated dense opacification of the right mid and lower chest. Mild pulmonary vascular congestion without edema. Cardiac and mediastinal contours are grossly within normal limits. No focal airspace consolidation in the left lung. No acute osseous abnormality.  IMPRESSION: 1. Large right layering pleural effusion with associated dense right mid and basilar airspace opacity. Findings are favored to reflect pleural fluid with atelectasis. Superimposed infiltrate/ pneumonia and mass are difficult to exclude radiographically. 2. Consider further evaluation with CT scan of the chest with contrast.   Electronically Signed   By: Jacqulynn Cadet M.D.   On: 08/16/2014 02:24    EKG: Independently reviewed.  Assessment/Plan Principal Problem:   Pleural effusion, right Active Problems:   End stage renal disease on dialysis   DM (diabetes mellitus) type II controlled peripheral vascular disorder   1. Large right sided pleural effusion - 1. CT chest with contrast ordered 2. IR pleuracentesis ordered 1. SCDs for dvt ppx 2. ESRD - dialysis later today, left message on consult line 3. DM2 - continue glucotrol 4. HTN - continue home meds  Left message on dialysis consult line.  Code Status: Full Code  Family Communication: No family in  room Disposition Plan: Admit to inpatient   Time spent: 70 min  Ellwood Steidle M. [facility] Pager [phone]  If 7AM-7PM, please contact the day team taking care of the patient [email or web address] Password [email or web address] 08/16/2014, 2:50 AM

## 2014-08-16 NOTE — Progress Notes (Signed)
[facility]                                                                              Patient Demographics  [name], is a 54 y.o. female, DOB - [birth year], FTD:[id number]  Admit date - 08/15/2014   Admitting Physician Etta Quill, DO  Outpatient Primary MD for the patient is Chari Manning, NP  LOS - 0   Chief Complaint  Patient presents with  . Abdominal Pain       Brief HPI   Patient is a 54 y.o. female with h/o ESRD, dialysis MWF, presents to the ED with complaints of right upper quadrant abdominal pain and right lower chest pain, describing it as gas pain. Patient reported symptoms have been ongoing for the past few days. She had been taking Rolaids without much relief. She also reported being short of breath on the day of admission.     Assessment & Plan    Principal Problem:   Abdominal pain - CT of the chest showed mild wall thickening and soft tissue inflammation about the gallbladder, raising concern for mild acute cholecystitis, underlying the cholelithiasis. Large loculated right-sided pleural effusion with consolidation of the right lung - Surgery consult called, obtain abdominal ultrasound to rule out acute cholecystitis - Lipase normal  Active Problems: Loculated Right pleural effusion with consolidation - Thoracentesis done, results pending - will place on IV Vanco and Zosyn per pharmacy, rule out empyema    End stage renal disease on dialysis - Per admitting physician, nephrology consult called, patient is on hemodialysis MWF, will need dialysis today     DM (diabetes mellitus) type II controlled peripheral vascular disorder - Continue sliding-scale insulin while inpatient  Hypertension - Currently controlled, continue amlodipine  Code Status: Full code  Family Communication: Discussed in detail with the patient, all imaging results, lab results explained to the patient    Disposition Plan: Pending evaluations  Time  Spent in minutes   25 minutes  Procedures  CT angiogram of the chest Thoracentesis  Consults   Interventional radiology Surgery  DVT Prophylaxis   SCD's  Medications  Scheduled Meds: . amLODipine  10 mg Oral QHS  . aspirin EC  81 mg Oral Daily  . clopidogrel  75 mg Oral Daily  . glipiZIDE  5 mg Oral Daily  . lidocaine (PF)      . multivitamin  1 tablet Oral Daily  . neomycin-bacitracin-polymyxin  1 application Topical Daily  . sodium chloride  500 mL Intravenous Once   Continuous Infusions:  PRN Meds:.acetaminophen, diphenhydrAMINE, traMADol   Antibiotics   Anti-infectives    None        Subjective:   [name] was seen and examined today.  Patient denies dizziness, D/C, new weakness, numbess, tingling. No acute events overnight.  Reports right-sided upper quadrant abdominal pain with nausea, gas, no significant chest pain, low-grade temp 99.9   Objective:   Blood pressure 132/78, pulse 87, temperature 99.9 F (37.7 C), temperature source Oral, resp. rate 23, height 5\' 2"  (1.575 m), weight 71.5 kg (157 lb 10.1 oz), SpO2 100 %.  Wt Readings from Last  3 Encounters:  08/16/14 71.5 kg (157 lb 10.1 oz)  12/10/13 74.844 kg (165 lb)  10/20/13 73.936 kg (163 lb)    No intake or output data in the 24 hours ending 08/16/14 1045  Exam  General: Alert and oriented x 3, NAD, speaks softly  HEENT:  PERRLA, EOMI, Anicteic Sclera, mucous membranes moist.   Neck: Supple, no JVD, no masses  CVS: S1 S2 auscultated, no rubs, murmurs or gallops. Regular rate and rhythm.  Respiratory: Decreased breath sounds on the right, CTA on the left  Abdomen: Soft, mildly tender in the right upper quadrant region, nondistended, + bowel sounds  Ext: no cyanosis clubbing or edema  Neuro: AAOx3, Cr N's II- XII. Strength 5/5 upper and lower extremities bilaterally  Skin: No rashes  Psych: Normal affect and demeanor, alert and oriented x3    Data Review   Micro  Results No results found for this or any previous visit (from the past 240 hour(s)).  Radiology Reports Dg Chest 2 View  08/16/2014   CLINICAL DATA:  54 year old female with several day history of chest and back pain accompanied by shortness of breath  EXAM: CHEST  2 VIEW  COMPARISON:  Prior chest x-ray 02/25/2014  FINDINGS: Large right layering pleural effusion with associated dense opacification of the right mid and lower chest. Mild pulmonary vascular congestion without edema. Cardiac and mediastinal contours are grossly within normal limits. No focal airspace consolidation in the left lung. No acute osseous abnormality.  IMPRESSION: 1. Large right layering pleural effusion with associated dense right mid and basilar airspace opacity. Findings are favored to reflect pleural fluid with atelectasis. Superimposed infiltrate/ pneumonia and mass are difficult to exclude radiographically. 2. Consider further evaluation with CT scan of the chest with contrast.   Electronically Signed   By: Jacqulynn Cadet M.D.   On: 08/16/2014 02:24   Ct Chest W Contrast  08/16/2014   CLINICAL DATA:  Acute onset of right-sided chest pain and upper abdominal pain. Shortness of breath. Pleural effusion noted on chest radiograph. Initial encounter.  EXAM: CT CHEST WITH CONTRAST  TECHNIQUE: Multidetector CT imaging of the chest was performed during intravenous contrast administration.  CONTRAST:  22mL OMNIPAQUE IOHEXOL 300 MG/ML  SOLN  COMPARISON:  Chest radiograph performed earlier today at 12:40 a.m., and CTA of the chest performed 05/07/2009  FINDINGS: There is a large loculated right-sided pleural effusion, with consolidation of much of the right lung. Minimal left basilar atelectasis is noted. No definite mass is seen. There is slightly unusual loculation of fluid along the right side of the mediastinum, without definite evidence of a pleural rind. No pneumothorax is seen.  The mediastinum is unremarkable in appearance. No  mediastinal lymphadenopathy is seen. No pericardial effusion is identified. The great vessels are grossly unremarkable in appearance. The thyroid gland is unremarkable. No axillary lymphadenopathy is seen.  The visualized portions of the liver and spleen are unremarkable in appearance. There is mild wall thickening and soft tissue inflammation about the gallbladder, raising concern for mild acute cholecystitis. A few stones are seen within the gallbladder. The visualized portions of the pancreas and adrenal glands are unremarkable. Bilateral renal atrophy is noted.  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  No acute osseous abnormalities are identified.  IMPRESSION: 1. Mild wall thickening and soft tissue inflammation about the gallbladder, raising concern for mild acute cholecystitis. Would correlate for associated symptoms. 2. Underlying cholelithiasis noted. 3. Large loculated right-sided  pleural effusion, with consolidation of much of the right lung. Minimal left basilar atelectasis noted. No definite mass is seen. Diagnostic thoracentesis could be considered for further evaluation, as deemed clinically appropriate. 4. Bilateral renal atrophy noted. 5. Calcification along the superior mesenteric artery, with mural thrombus and luminal narrowing.   Electronically Signed   By: Garald Balding M.D.   On: 08/16/2014 04:58    CBC  Recent Labs Lab 08/16/14 0112 08/16/14 0121  WBC 11.9*  --   HGB 12.7 15.3*  HCT 37.9 45.0  PLT 324  --   MCV 91.8  --   MCH 30.8  --   MCHC 33.5  --   RDW 14.6  --   LYMPHSABS 1.4  --   MONOABS 0.8  --   EOSABS 0.6  --   BASOSABS 0.1  --     Chemistries   Recent Labs Lab 08/16/14 0112 08/16/14 0121  NA 129* 129*  K 4.7 4.7  CL 85* 91*  CO2 24  --   GLUCOSE 233* 239*  BUN 63* 58*  CREATININE 12.41* 11.40*  CALCIUM 9.2  --   AST 22  --   ALT 20  --   ALKPHOS 90  --   BILITOT 0.6  --     ------------------------------------------------------------------------------------------------------------------ estimated creatinine clearance is 5.4 mL/min (by C-G formula based on Cr of 11.4). ------------------------------------------------------------------------------------------------------------------ No results for input(s): HGBA1C in the last 72 hours. ------------------------------------------------------------------------------------------------------------------ No results for input(s): CHOL, HDL, LDLCALC, TRIG, CHOLHDL, LDLDIRECT in the last 72 hours. ------------------------------------------------------------------------------------------------------------------ No results for input(s): TSH, T4TOTAL, T3FREE, THYROIDAB in the last 72 hours.  Invalid input(s): FREET3 ------------------------------------------------------------------------------------------------------------------ No results for input(s): VITAMINB12, FOLATE, FERRITIN, TIBC, IRON, RETICCTPCT in the last 72 hours.  Coagulation profile  Recent Labs Lab 08/16/14 0112  INR 1.22    No results for input(s): DDIMER in the last 72 hours.  Cardiac Enzymes No results for input(s): CKMB, TROPONINI, MYOGLOBIN in the last 168 hours.  Invalid input(s): CK ------------------------------------------------------------------------------------------------------------------ Invalid input(s): POCBNP   Recent Labs  08/16/14 0651  GLUCAP 198*     Albion Weatherholtz M.D. [facility] 08/16/2014, 10:45 AM  Pager: [phone]   Between 7am to 7pm - call Pager - [phone]  After 7pm go to [email or web address] - password [email or web address]  Call night coverage person covering after 7pm

## 2014-08-16 NOTE — Consult Note (Signed)
WOC wound consult note Reason for Consult: left heel ulcer.  Pt with longstanding history of PVD.  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  Has been followed by vascular and "foot doctor" in the past.  She reports "much better, has come a long way".  I am not able to palpate DP or PT pulses in the left foot today, and no working doppler available at the time of my assessment.  She does not have any significant edema.  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   Of note pt also has hyperkeratotic area over the old transmet site   Drainage (amount, consistency, odor) minimal, no odor Periwound: dry skin  Dressing procedure/placement/frequency: After assessment, discussed need for enzymatic debridement ointment to clear necrotic tissue with patient. At this time she does not wish to have any additional wound care treatment.   Discussed POC with patient and bedside nurse.  Re consult if needed, will not follow at this time. Thanks  Cruzito Standre Kellogg, [place] [phone])

## 2014-08-16 NOTE — Progress Notes (Signed)
Inpatient Diabetes Program Recommendations  AACE/ADA: New Consensus Statement on Inpatient Glycemic Control (2013)  Target Ranges:  Prepandial:   less than 140 mg/dL      Peak postprandial:   less than 180 mg/dL (1-2 hours)      Critically ill patients:  140 - 180 mg/dL   Inpatient Diabetes Program Recommendations Correction (SSI): add sensitive scale TID  Thank you  Raoul Pitch BSN, RN,CDE Inpatient Diabetes Coordinator [phone] (team pager)

## 2014-08-16 NOTE — Procedures (Signed)
Procedure:  Left external jugular central line Findings:  Only small left external jugular vein identified by [place].  Both IJ's occluded. 4 Fr catheter placed and advanced to level of left subclavian vein.  Would not pass further centrally.

## 2014-08-16 NOTE — ED Notes (Signed)
C/o gas pain x 2 days.  Dialysis patient.  States I also have more fluid on me than normal.  States the gas pain is in the abdomen through to the lower back.

## 2014-08-16 NOTE — Consult Note (Signed)
[facility] Renal Consultation Note  Indication for Consultation:  Management of ESRD/hemodialysis; anemia, hypertension/volume and secondary hyperparathyroidism  HPI: [name] is a 54 y.o. female with ESRD MWF on Hd 15 years ,( [facility] last hd on schedule past fri) admitted with Large R Pleural effusion . She came to ER sec to 2 day history of R  Chest discomfort  She describes as   "gas pain" increasing in discomfort with associated SOB. She  denies fever, chills, cough , abd pain, or problems using her R fem Avgg She is just back from her  Pleurocentesis  now / Ct with contrast pending .      Past Medical History  Diagnosis Date  . ESRD (end stage renal disease)   . Hypertension   . CVA (cerebral infarction)     right parietal 05/19/2000  . Vaginal bleeding   . Hyperparathyroidism   . Pneumonia   . Stroke     2002,no residual  . Peripheral vascular disease   . GERD (gastroesophageal reflux disease)   . Anemia   . DM (diabetes mellitus) type II controlled peripheral vascular disorder   . Slip/trip w/o falling due to stepping on object, subs July  2014    Past Surgical History  Procedure Laterality Date  . Parathyroidectomy with autotransplant  12/07/2010  . Knee surgery      right, x2  . Brachial artery graft      x2 for dialysis  . Av fistula placement      upper rt thigh  . Dental extractions Bilateral   . Dilation and curettage of uterus      hx: of 1986  . Transmetatarsal amputation Left 07/15/2012    Procedure: TRANSMETATARSAL AMPUTATION;  Surgeon: Angelia Mould, MD;  [facility];  Service: Vascular;  Laterality: Left;  . Leg surgery Right   . Foot surgery Left     5 TOES AMPUTATED  . Abdominal aortagram N/A 05/27/2012    Procedure: ABDOMINAL Maxcine Ham;  Surgeon: Serafina Mitchell, MD;  Location: [facility] CATH LAB;  Service: Cardiovascular;  Laterality: N/A;  . Lower extremity angiogram Left 05/27/2012    Procedure: LOWER EXTREMITY ANGIOGRAM;   Surgeon: Serafina Mitchell, MD;  Location: [facility] CATH LAB;  Service: Cardiovascular;  Laterality: Left;  lt leg angio      Family History  Problem Relation Age of Onset  . Hypertension Father   . Kidney disease Mother       reports that she has never smoked. She has never used smokeless tobacco. She reports that she drinks alcohol. She reports that she does not use illicit drugs.   Allergies  Allergen Reactions  . Doxycycline Itching  . Peroxyl [Hydrogen Peroxide] Hives and Rash    Prior to Admission medications   Medication Sig Start Date End Date Taking? Authorizing Provider  Acetaminophen (TYLENOL PO) Take 325-650 mg by mouth See admin instructions. Uses at dialysis Mon, Wed, Fridays each week for muscle spasms   Yes Historical Provider, MD  amLODipine (NORVASC) 10 MG tablet Take 0.5 tablets (5 mg total) by mouth at bedtime. Patient taking differently: Take 10 mg by mouth at bedtime.  01/19/14  Yes Lance Bosch, NP  aspirin 81 MG tablet Take 1 tablet (81 mg total) by mouth daily. 01/19/14  Yes Lance Bosch, NP  CALCIUM-VITAMIN D PO Take 1 tablet by mouth 3 (three) times daily with meals.   Yes Historical Provider, MD  Clopidogrel Bisulfate (PLAVIX PO) Take 75  mg by mouth daily.    Yes Historical Provider, MD  Cyanocobalamin (VITAMIN B-12 PO) Take 1 tablet by mouth 4 (four) times a week. Non dialysis days. Cain Saupe, Sat and Sunday   Yes Historical Provider, MD  diphenhydrAMINE (BENADRYL) 25 MG tablet Take 25 mg by mouth every 6 (six) hours as needed (Muscle spasm in feet).   Yes Historical Provider, MD  glipiZIDE (GLUCOTROL) 10 MG tablet Take 0.5 tablets (5 mg total) by mouth daily. 01/19/14  Yes Lance Bosch, NP  multivitamin (RENA-VIT) TABS tablet Take 1 tablet by mouth daily.   Yes Historical Provider, MD  neomycin-bacitracin-polymyxin (NEOSPORIN) [phone] ointment Apply 1 application topically daily. Apply to foot   Yes Historical Provider, MD  traMADol (ULTRAM) 50 MG tablet  Take 50 mg by mouth 2 (two) times daily as needed for moderate pain (for foot pain). For foot pain 07/26/14  Yes Historical Provider, MD    ALP:[id number], diphenhydrAMINE, traMADol  Results for orders placed or performed during the hospital encounter of 08/15/14 (from the past 48 hour(s))  CBC with Differential/Platelet     Status: Abnormal   Collection Time: 08/16/14  1:12 AM  Result Value Ref Range   WBC 11.9 (H) 4.0 - 10.5 K/uL   RBC 4.13 3.87 - 5.11 MIL/uL   Hemoglobin 12.7 12.0 - 15.0 g/dL   HCT 37.9 36.0 - 46.0 %   MCV 91.8 78.0 - 100.0 fL   MCH 30.8 26.0 - 34.0 pg   MCHC 33.5 30.0 - 36.0 g/dL   RDW 14.6 11.5 - 15.5 %   Platelets 324 150 - 400 K/uL   Neutrophils Relative % 75 43 - 77 %   Neutro Abs 9.1 (H) 1.7 - 7.7 K/uL   Lymphocytes Relative 12 12 - 46 %   Lymphs Abs 1.4 0.7 - 4.0 K/uL   Monocytes Relative 7 3 - 12 %   Monocytes Absolute 0.8 0.1 - 1.0 K/uL   Eosinophils Relative 5 0 - 5 %   Eosinophils Absolute 0.6 0.0 - 0.7 K/uL   Basophils Relative 1 0 - 1 %   Basophils Absolute 0.1 0.0 - 0.1 K/uL  Comprehensive metabolic panel     Status: Abnormal   Collection Time: 08/16/14  1:12 AM  Result Value Ref Range   Sodium 129 (L) 135 - 145 mmol/L   Potassium 4.7 3.5 - 5.1 mmol/L   Chloride 85 (L) 96 - 112 mmol/L   CO2 24 19 - 32 mmol/L   Glucose, Bld 233 (H) 70 - 99 mg/dL   BUN 63 (H) 6 - 23 mg/dL   Creatinine, Ser 12.41 (H) 0.50 - 1.10 mg/dL   Calcium 9.2 8.4 - 10.5 mg/dL   Total Protein 8.8 (H) 6.0 - 8.3 g/dL   Albumin 3.2 (L) 3.5 - 5.2 g/dL   AST 22 0 - 37 U/L   ALT 20 0 - 35 U/L   Alkaline Phosphatase 90 39 - 117 U/L   Total Bilirubin 0.6 0.3 - 1.2 mg/dL   GFR calc non Af Amer 3 (L) >90 mL/min   GFR calc Af Amer 4 (L) >90 mL/min    Comment: (NOTE) The eGFR has been calculated using the CKD EPI equation. This calculation has not been validated in all clinical situations. eGFR's persistently <90 mL/min signify possible Chronic Kidney Disease.    Anion gap  20 (H) 5 - 15  Lipase, blood     Status: None   Collection Time: 08/16/14  1:12 AM  Result Value Ref Range   Lipase 46 11 - 59 U/L  Protime-INR     Status: Abnormal   Collection Time: 08/16/14  1:12 AM  Result Value Ref Range   Prothrombin Time 15.5 (H) 11.6 - 15.2 seconds   INR 1.22 0.00 - 1.49  I-stat troponin, ED     Status: None   Collection Time: 08/16/14  1:20 AM  Result Value Ref Range   Troponin i, poc 0.03 0.00 - 0.08 ng/mL   Comment 3            Comment: Due to the release kinetics of cTnI, a negative result within the first hours of the onset of symptoms does not rule out myocardial infarction with certainty. If myocardial infarction is still suspected, repeat the test at appropriate intervals.   I-stat chem 8, ed     Status: Abnormal   Collection Time: 08/16/14  1:21 AM  Result Value Ref Range   Sodium 129 (L) 135 - 145 mmol/L   Potassium 4.7 3.5 - 5.1 mmol/L   Chloride 91 (L) 96 - 112 mmol/L   BUN 58 (H) 6 - 23 mg/dL   Creatinine, Ser 11.40 (H) 0.50 - 1.10 mg/dL   Glucose, Bld 239 (H) 70 - 99 mg/dL   Calcium, Ion 0.99 (L) 1.12 - 1.23 mmol/L   TCO2 24 0 - 100 mmol/L   Hemoglobin 15.3 (H) 12.0 - 15.0 g/dL   HCT 45.0 36.0 - 46.0 %  I-Stat arterial blood gas, ED     Status: Abnormal   Collection Time: 08/16/14  1:21 AM  Result Value Ref Range   pH, Arterial 7.455 (H) 7.350 - 7.450   pCO2 arterial 36.8 35.0 - 45.0 mmHg   pO2, Arterial 60.0 (L) 80.0 - 100.0 mmHg   Bicarbonate 25.9 (H) 20.0 - 24.0 mEq/L   TCO2 27 0 - 100 mmol/L   O2 Saturation 92.0 %   Acid-Base Excess 2.0 0.0 - 2.0 mmol/L   Patient temperature 98.6 F    Collection site RADIAL, ALLEN'S TEST ACCEPTABLE    Drawn by RT    Sample type ARTERIAL   I-Stat CG4 Lactic Acid, ED     Status: None   Collection Time: 08/16/14  1:23 AM  Result Value Ref Range   Lactic Acid, Venous 1.82 0.5 - 2.0 mmol/L  Glucose, capillary     Status: Abnormal   Collection Time: 08/16/14  6:51 AM  Result Value Ref Range    Glucose-Capillary 198 (H) 70 - 99 mg/dL  Lactate dehydrogenase, pleural or peritoneal fluid     Status: Abnormal   Collection Time: 08/16/14 10:16 AM  Result Value Ref Range   LD, Fluid 171 (H) 3 - 23 U/L    Comment: (NOTE) Results should be evaluated in conjunction with serum values    Fluid Type-FLDH FLUID     Comment: PLEURAL RIGHT CORRECTED ON 04/25 AT 1034: PREVIOUSLY REPORTED AS Pleural R   Protein, pleural or peritoneal fluid     Status: None   Collection Time: 08/16/14 10:16 AM  Result Value Ref Range   Total protein, fluid 5.7 g/dL    Comment: (NOTE) No normal range established for this test Results should be evaluated in conjunction with serum values    Fluid Type-FTP FLUID     Comment: PLEURAL RIGHT CORRECTED ON 04/25 AT 1034: PREVIOUSLY REPORTED AS Pleural R   Body fluid cell count with differential     Status: Abnormal   Collection Time: 08/16/14  10:16 AM  Result Value Ref Range   Fluid Type-FCT FLUID     Comment: PLEURAL RIGHT CORRECTED ON 04/25 AT 1033: PREVIOUSLY REPORTED AS Pleural R    Color, Fluid YELLOW YELLOW   Appearance, Fluid CLOUDY (A) CLEAR   WBC, Fluid 3133 (H) 0 - 1000 cu mm   Neutrophil Count, Fluid PENDING 0 - 25 %   Lymphs, Fluid PENDING %   Monocyte-Macrophage-Serous Fluid PENDING 50 - 90 %   Eos, Fluid PENDING %   Other Cells, Fluid PENDING %  Glucose, pleural or peritoneal fluid     Status: None   Collection Time: 08/16/14 10:16 AM  Result Value Ref Range   Glucose, Fluid 218 mg/dL    Comment: (NOTE) No normal range established for this test Results should be evaluated in conjunction with serum values    Fluid Type-FGLU FLUID     Comment: PLEURAL RIGHT CORRECTED ON 04/25 AT 1035: PREVIOUSLY REPORTED AS Pleural R     ROS: see hpi for pos.Alsd chronic R foot wound Followed by wound clinic in past and she does daily dressing to medial foot wound  Physical Exam: Filed Vitals:   08/16/14 1009  BP: 132/78  Pulse: 87  Temp:    Resp:      General: alert soft spoken BF ,NAD, appropirate HEENT: [place], MMM Eyes: nonicteric Neck: supple, no jvd Heart: RRR, no rub, gallop or mur Lungs: Decr. BS R side / L side cta Abdomen: bs pos. ,soft, NT, ND Extremities: No pedal edema Skin: L foot medially wound ulcer whitish/yellow edges nontender Neuro: Alert Ox3, No acute focal deficits for her Dialysis Access:  Pos . Bruit R  fem avgg  Dialysis Orders: [facility]: [facility]  on MWF . EDW 68kg HD Bath 2.0 k, 2.25 ca  Time 3.5 hr Heparin 2000. Access R fem avgg  BFR 450 DFR Af1.5    Zemplar 0 mcg IV/HD Epogen 0   Units IV/HD  Venofer  0  Other op lab 12.1, ca 8.6/ phos 7.3 ''Does not take binders"  Assessment/Plan 1. Large R  Pleural Effusion + wu per admit team  2. ESRD -  MWF HD  k 4.7 / No hep today sp Pleurocentesis  3. Hypertension/volume  - HTN on admit ??? Compliance  With  Norvasc as op / wt 74.8 to 71.5 ?  wts post pleurocentesis   Attempt uf 2 to 3 l on hd  68 kg edw 4. Anemia  -hgb  12.7  No esa or fe  5. Metabolic bone disease -  No vit d  Binder with meals / fu ca phos labs 6. Nutrition - renal  Diet carb mod / Renal vitamin 7. DM type 2 - per admit team   Ernest Haber, PA-C [place] [phone] 08/16/2014, 11:32 AM

## 2014-08-16 NOTE — ED Notes (Signed)
Attempted IV x1, unsuccessful.  

## 2014-08-17 LAB — GLUCOSE, CAPILLARY
Glucose-Capillary: 310 mg/dL — ABNORMAL HIGH (ref 70–99)
Glucose-Capillary: 204 mg/dL — ABNORMAL HIGH (ref 70–99)
Glucose-Capillary: 217 mg/dL — ABNORMAL HIGH (ref 70–99)

## 2014-08-17 LAB — PH, BODY FLUID: PH, FLUID: 8

## 2014-08-17 LAB — BASIC METABOLIC PANEL
Anion gap: 17 — ABNORMAL HIGH (ref 5–15)
BUN: 36 mg/dL — AB (ref 6–23)
CHLORIDE: 94 mmol/L — AB (ref 96–112)
CO2: 22 mmol/L (ref 19–32)
Calcium: 8.4 mg/dL (ref 8.4–10.5)
Creatinine, Ser: 8.62 mg/dL — ABNORMAL HIGH (ref 0.50–1.10)
GFR, EST AFRICAN AMERICAN: 5 mL/min — AB (ref >90)
GFR, EST NON AFRICAN AMERICAN: 5 mL/min — AB (ref >90)
Glucose, Bld: 205 mg/dL — ABNORMAL HIGH (ref 70–99)
POTASSIUM: 5.9 mmol/L — AB (ref 3.5–5.1)
SODIUM: 133 mmol/L — AB (ref 135–145)

## 2014-08-17 LAB — POTASSIUM: Potassium: 5 mmol/L (ref 3.5–5.1)

## 2014-08-17 LAB — CBC
HCT: 36 % (ref 36.0–46.0)
Hemoglobin: 12.1 g/dL (ref 12.0–15.0)
MCH: 31.1 pg (ref 26.0–34.0)
MCHC: 33.6 g/dL (ref 30.0–36.0)
MCV: 92.5 fL (ref 78.0–100.0)
PLATELETS: 326 10*3/uL (ref 150–400)
RBC: 3.89 MIL/uL (ref 3.87–5.11)
RDW: 15 % (ref 11.5–15.5)
WBC: 11.3 10*3/uL — AB (ref 4.0–10.5)

## 2014-08-17 MED ORDER — INSULIN ASPART 100 UNIT/ML ~~LOC~~ SOLN
0.0000 [IU] | Freq: Every day | SUBCUTANEOUS | Status: DC
  Administered 2014-08-19: 5 [IU] via SUBCUTANEOUS
  Administered 2014-08-20: 2 [IU] via SUBCUTANEOUS

## 2014-08-17 MED ORDER — INSULIN ASPART 100 UNIT/ML ~~LOC~~ SOLN
0.0000 [IU] | Freq: Three times a day (TID) | SUBCUTANEOUS | Status: DC
  Administered 2014-08-18: 8 [IU] via SUBCUTANEOUS
  Administered 2014-08-19 (×2): 5 [IU] via SUBCUTANEOUS
  Administered 2014-08-20: 11 [IU] via SUBCUTANEOUS
  Administered 2014-08-21: 5 [IU] via SUBCUTANEOUS
  Administered 2014-08-21: 3 [IU] via SUBCUTANEOUS

## 2014-08-17 NOTE — Progress Notes (Signed)
Patient ID: [name], female   DOB: [birth year], 54 y.o.   MRN: [id number]  [facility] Progress Note   Assessment/ Plan:   1. Large R Pleural Effusion : Status post thoracentesis yesterday, initial studies indicative of an exudative pleural effusion by Light's criteria. Cultures appear to be so far negative and CT scan of the chest did not show associated pneumonia/mass. 2. ESRD - usually on a Monday/Wednesday/Friday dialysis schedule-we'll recheck labs again today as initial labs showed a potassium of 5.9 (surprising because she got dialyzed yesterday)and determine need for dialysis today. 3. Hypertension/volume - blood pressures appear to be fairly controlled at this time-status post ultrafiltration and resumption of usual antihypertensive therapy. 4. Anemia -hemoglobin stable, continue to monitor-no overt loss  5. Metabolic bone disease - No vit d Binder with meals / fu ca phos labs 6. Nutrition - renal Diet carb mod / Renal vitamin 7. Possible acute cholecystitis: this was noted on her CT scan of the chest but clinically she is asymptomatic and has been cleared by surgery.   Subjective:   Reports to be feeling somewhat better and denies any chest pain or shortness of breath   Objective:   BP 118/50 mmHg  Pulse 109  Temp(Src) 99.1 F (37.3 C) (Oral)  Resp 19  Ht 5\' 2"  (1.575 m)  Wt 69.7 kg (153 lb 10.6 oz)  BMI 28.10 kg/m2  SpO2 94%  Physical Exam: Gen: comfortably sitting up in recliner, eating breakfast CVS: pulse regular tachycardia, S1 and S2 normal  Resp: Decreased breath sounds right base otherwise clear to auscultation Abd: Soft, obese, nontender Ext: No lower extremity edema  Labs: BMET  Recent Labs Lab 08/16/14 0112 08/16/14 0121 08/16/14 1730 08/17/14 0555  NA 129* 129* 130* 133*  K 4.7 4.7 4.7 5.9*  CL 85* 91* 88* 94*  CO2 24  --  24 22  GLUCOSE 233* 239* 166* 205*  BUN 63* 58* 75* 36*  CREATININE 12.41* 11.40* 13.51* 8.62*   CALCIUM 9.2  --  8.4 8.4  PHOS  --   --  6.4*  --    CBC  Recent Labs Lab 08/16/14 0112 08/16/14 0121 08/16/14 1730 08/17/14 0555  WBC 11.9*  --  9.9 11.3*  NEUTROABS 9.1*  --   --   --   HGB 12.7 15.3* 11.4* 12.1  HCT 37.9 45.0 34.0* 36.0  MCV 91.8  --  90.9 92.5  PLT 324  --  321 326    Medications:    . amLODipine  10 mg Oral QHS  . aspirin EC  81 mg Oral Daily  . clopidogrel  75 mg Oral Daily  . multivitamin  1 tablet Oral Daily  . neomycin-bacitracin-polymyxin  1 application Topical Daily  . piperacillin-tazobactam (ZOSYN)  IV  2.25 g Intravenous 3 times per day  . sodium chloride  500 mL Intravenous Once  . vancomycin  750 mg Intravenous Q M,W,F-HD   Elmarie Shiley, MD 08/17/2014, 9:34 AM

## 2014-08-17 NOTE — Progress Notes (Signed)
[facility]                                                                              Patient Demographics  [name], is a 54 y.o. female, DOB - 1960/10/13, RWE:[id number]  Admit date - 08/15/2014   Admitting Physician Etta Quill, DO  Outpatient Primary MD for the patient is Chari Manning, NP  LOS - 1   Chief Complaint  Patient presents with  . Abdominal Pain       Brief HPI   Patient is a 54 y.o. female with h/o ESRD, dialysis MWF, presents to the ED with complaints of right upper quadrant abdominal pain and right lower chest pain, describing it as gas pain. Patient reported symptoms have been ongoing for the past few days. She had been taking Rolaids without much relief. She also reported being short of breath on the day of admission.     Assessment & Plan    Principal Problem:   Right upper Abdominal pain - CT of the chest showed mild wall thickening and soft tissue inflammation about the gallbladder, raising concern for mild acute cholecystitis, underlying the cholelithiasis. Large loculated right-sided pleural effusion with consolidation of the right lung - Surgery consult called, right upper quadrant ultrasound did not show acute cholecystitis, surgery signed off  - Lipase normal  Active Problems: Loculated Right pleural effusion with consolidation - Thoracentesis done, cultures so far negative, continue IV antibiotics for now    End stage renal disease on dialysis - Nephrology following, planning dialysis today if hyperkalemia, first shift tomorrow morning     DM (diabetes mellitus) type II controlled peripheral vascular disorder - Continue sliding-scale insulin while inpatient  Hypertension - Currently controlled, continue amlodipine  Code Status: Full code  Family Communication: Discussed in detail with the patient, all imaging results, lab results explained to the patient    Disposition Plan: Hopefully DC home tomorrow after  dialysis pending pleural fluid cultures remain negative. Renal planning for first shift dialysis tomorrow morning  Time Spent in minutes   25 minutes  Procedures  CT angiogram of the chest Thoracentesis  Consults   Interventional radiology Surgery  DVT Prophylaxis   SCD's  Medications  Scheduled Meds: . amLODipine  10 mg Oral QHS  . aspirin EC  81 mg Oral Daily  . clopidogrel  75 mg Oral Daily  . multivitamin  1 tablet Oral Daily  . neomycin-bacitracin-polymyxin  1 application Topical Daily  . piperacillin-tazobactam (ZOSYN)  IV  2.25 g Intravenous 3 times per day  . sodium chloride  500 mL Intravenous Once  . vancomycin  750 mg Intravenous Q M,W,F-HD   Continuous Infusions:  PRN Meds:.acetaminophen, diphenhydrAMINE, traMADol   Antibiotics   Anti-infectives    Start     Dose/Rate Route Frequency Ordered Stop   08/16/14 1600  vancomycin (VANCOCIN) IVPB 750 mg/150 ml premix     750 mg 150 mL/hr over 60 Minutes Intravenous Every M-W-F (Hemodialysis) 08/16/14 1134     08/16/14 1200  piperacillin-tazobactam (ZOSYN) IVPB 2.25 g     2.25 g 100 mL/hr over 30 Minutes Intravenous 3 times per day  08/16/14 1134     08/16/14 1145  vancomycin (VANCOCIN) 1,500 mg in sodium chloride 0.9 % 500 mL IVPB     1,500 mg 250 mL/hr over 120 Minutes Intravenous NOW 08/16/14 1134 08/16/14 1520        Subjective:   [name] was seen and examined today.  Patient denies any chest pain, shortness of breath, no abdominal pain this morning, low-grade temp +. Denies any coughing, dizziness, lightheadedness, diarrhea or constipation.  Objective:   Blood pressure 118/50, pulse 109, temperature 99.1 F (37.3 C), temperature source Oral, resp. rate 19, height 5\' 2"  (1.575 m), weight 69.7 kg (153 lb 10.6 oz), SpO2 94 %.  Wt Readings from Last 3 Encounters:  08/17/14 69.7 kg (153 lb 10.6 oz)  12/10/13 74.844 kg (165 lb)  10/20/13 73.936 kg (163 lb)     Intake/Output Summary (Last 24  hours) at 08/17/14 1237 Last data filed at 08/17/14 0900  Gross per 24 hour  Intake    480 ml  Output   2302 ml  Net  -1822 ml    Exam  General: Alert and oriented x 3, NAD, speaks softly  HEENT:  PERRLA, EOMI, Anicteic Sclera, mucous membranes moist.   Neck: Supple, no JVD, no masses  CVS: S1 S2 clear, RRR  Respiratory: Decreased breath sounds on the right, CTA on the left  Abdomen: Soft, NT, ND, + bowel sounds  Ext: no cyanosis clubbing or edema  Neuro: AAOx3, Cr N's II- XII. Strength 5/5 upper and lower extremities bilaterally  Skin: No rashes  Psych: Normal affect and demeanor, alert and oriented x3    Data Review   Micro Results Recent Results (from the past 240 hour(s))  Body fluid culture     Status: None (Preliminary result)   Collection Time: 08/16/14 10:16 AM  Result Value Ref Range Status   Specimen Description FLUID PLEURAL RIGHT  Final   Special Requests NONE  Final   Gram Stain   Final    RARE WBC PRESENT,BOTH PMN AND MONONUCLEAR NO ORGANISMS SEEN Performed at [facility]    Culture   Final    NO GROWTH 1 DAY Performed at [facility]    Report Status PENDING  Incomplete    Radiology Reports Dg Chest 1 View  08/16/2014   CLINICAL DATA:  Thoracentesis.  EXAM: CHEST  1 VIEW  COMPARISON:  08/16/2014  FINDINGS: Large right pleural effusion is modestly decreased from previous. The underlying lung is opacified.  Unchanged cardiomegaly and vascular pedicle widening. No pulmonary edema no in the left lung. No overt re-expansion edema in the right lung.  IMPRESSION: Decreased but still large right pleural effusion.  No pneumothorax.   Electronically Signed   By: Monte Fantasia M.D.   On: 08/16/2014 10:49   Dg Chest 2 View  08/16/2014   CLINICAL DATA:  54 year old female with several day history of chest and back pain accompanied by shortness of breath  EXAM: CHEST  2 VIEW  COMPARISON:  Prior chest x-ray 02/25/2014  FINDINGS: Large right  layering pleural effusion with associated dense opacification of the right mid and lower chest. Mild pulmonary vascular congestion without edema. Cardiac and mediastinal contours are grossly within normal limits. No focal airspace consolidation in the left lung. No acute osseous abnormality.  IMPRESSION: 1. Large right layering pleural effusion with associated dense right mid and basilar airspace opacity. Findings are favored to reflect pleural fluid with atelectasis. Superimposed infiltrate/ pneumonia and mass are difficult  to exclude radiographically. 2. Consider further evaluation with CT scan of the chest with contrast.   Electronically Signed   By: Jacqulynn Cadet M.D.   On: 08/16/2014 02:24   Ct Chest W Contrast  08/16/2014   CLINICAL DATA:  Acute onset of right-sided chest pain and upper abdominal pain. Shortness of breath. Pleural effusion noted on chest radiograph. Initial encounter.  EXAM: CT CHEST WITH CONTRAST  TECHNIQUE: Multidetector CT imaging of the chest was performed during intravenous contrast administration.  CONTRAST:  7mL OMNIPAQUE IOHEXOL 300 MG/ML  SOLN  COMPARISON:  Chest radiograph performed earlier today at 12:40 a.m., and CTA of the chest performed 05/07/2009  FINDINGS: There is a large loculated right-sided pleural effusion, with consolidation of much of the right lung. Minimal left basilar atelectasis is noted. No definite mass is seen. There is slightly unusual loculation of fluid along the right side of the mediastinum, without definite evidence of a pleural rind. No pneumothorax is seen.  The mediastinum is unremarkable in appearance. No mediastinal lymphadenopathy is seen. No pericardial effusion is identified. The great vessels are grossly unremarkable in appearance. The thyroid gland is unremarkable. No axillary lymphadenopathy is seen.  The visualized portions of the liver and spleen are unremarkable in appearance. There is mild wall thickening and soft tissue inflammation  about the gallbladder, raising concern for mild acute cholecystitis. A few stones are seen within the gallbladder. The visualized portions of the pancreas and adrenal glands are unremarkable. Bilateral renal atrophy is noted.  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  No acute osseous abnormalities are identified.  IMPRESSION: 1. Mild wall thickening and soft tissue inflammation about the gallbladder, raising concern for mild acute cholecystitis. Would correlate for associated symptoms. 2. Underlying cholelithiasis noted. 3. Large loculated right-sided pleural effusion, with consolidation of much of the right lung. Minimal left basilar atelectasis noted. No definite mass is seen. Diagnostic thoracentesis could be considered for further evaluation, as deemed clinically appropriate. 4. Bilateral renal atrophy noted. 5. Calcification along the superior mesenteric artery, with mural thrombus and luminal narrowing.   Electronically Signed   By: Garald Balding M.D.   On: 08/16/2014 04:58   Ir Fluoro Guide Cv Line Left  08/16/2014   CLINICAL DATA:  Poor intravenous access and history of end-stage renal disease. The patient requires IV access for antibiotics and other medications.  EXAM: NON-TUNNELED CENTRAL VENOUS CATHETER PLACEMENT WITH ULTRASOUND AND FLUOROSCOPIC GUIDANCE  FLUOROSCOPY TIME:  1 minute and 14 seconds.  PROCEDURE: The procedure, risks, benefits, and alternatives were explained to the patient. Questions regarding the procedure were encouraged and answered. The patient understands and consents to the procedure.  Ultrasound was performed of both right and left neck. The left neck was prepped with chlorhexidine in a sterile fashion, and a sterile drape was applied covering the operative field. Maximum barrier sterile technique with sterile gowns and gloves were used for the procedure. A time-out was performed prior to the procedure. Local  anesthesia was provided with 1% lidocaine.  After creating a small venotomy incision, a 21 gauge needle was advanced into the left external jugular vein under direct, real-time ultrasound guidance. Ultrasound image documentation was performed. A guidewire was advanced into the vein.  A 4 French micropuncture dilator was advanced over the guidewire.  Final catheter positioning was confirmed and documented with a fluoroscopic spot image. Venous access was aspirated and flushed with sterile saline. A dressing was applied over the catheter exit  site.  COMPLICATIONS: None.  No pneumothorax.  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 A small left external jugular vein was identifiable by ultrasound. No external jugular vein was visualized on the right.  After establishing access into the left external jugular vein, a guidewire would only advanced to the level of the subclavian vein. There was good return of blood and the catheter flushed well. The catheter tip lies in the left subclavian vein on completion.  IMPRESSION: Placement of non-tunneled central venous catheter via the left external jugular vein. The catheter tip lies in the left subclavian vein. The catheter is ready for immediate use.   Electronically Signed   By: Aletta Edouard M.D.   On: 08/16/2014 17:10   Ir US Guide Vasc Access Left  08/16/2014   CLINICAL DATA:  Poor intravenous access and history of end-stage renal disease. The patient requires IV access for antibiotics and other medications.  EXAM: NON-TUNNELED CENTRAL VENOUS CATHETER PLACEMENT WITH ULTRASOUND AND FLUOROSCOPIC GUIDANCE  FLUOROSCOPY TIME:  1 minute and 14 seconds.  PROCEDURE: The procedure, risks, benefits, and alternatives were explained to the patient. Questions regarding the procedure were encouraged and answered. The patient understands and consents to the procedure.  Ultrasound was performed of both right and left neck. The left  neck was prepped with chlorhexidine in a sterile fashion, and a sterile drape was applied covering the operative field. Maximum barrier sterile technique with sterile gowns and gloves were used for the procedure. A time-out was performed prior to the procedure. Local anesthesia was provided with 1% lidocaine.  After creating a small venotomy incision, a 21 gauge needle was advanced into the left external jugular vein under direct, real-time ultrasound guidance. Ultrasound image documentation was performed. A guidewire was advanced into the vein.  A 4 French micropuncture dilator was advanced over the guidewire.  Final catheter positioning was confirmed and documented with a fluoroscopic spot image. Venous access was aspirated and flushed with sterile saline. A dressing was applied over the catheter exit site.  COMPLICATIONS: None.  No pneumothorax.  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 A small left external jugular vein was identifiable by ultrasound. No external jugular vein was visualized on the right.  After establishing access into the left external jugular vein, a guidewire would only advanced to the level of the subclavian vein. There was good return of blood and the catheter flushed well. The catheter tip lies in the left subclavian vein on completion.  IMPRESSION: Placement of non-tunneled central venous catheter via the left external jugular vein. The catheter tip lies in the left subclavian vein. The catheter is ready for immediate use.   Electronically Signed   By: Aletta Edouard M.D.   On: 08/16/2014 17:10   US Abdomen Limited Ruq  08/16/2014   CLINICAL DATA:  Cholecystitis  EXAM: US ABDOMEN LIMITED - RIGHT UPPER QUADRANT  COMPARISON:  None.  FINDINGS: Gallbladder:  Cholelithiasis. There is circumferential wall thickening to 4 mm without focal tenderness or pericholecystic edema.  Common bile duct:  Diameter: 5-6 mm  Liver:  No focal lesion  identified. Within normal limits in parenchymal echogenicity. Antegrade flow in the hepatic and portal venous system.  Atrophic and echogenic right kidney consistent with patient's history of end-stage renal disease. Known right pleural effusion.  IMPRESSION: Cholelithiasis and gallbladder wall thickening. No gallbladder distention or focal tenderness to suggest obstruction/acute cholecystitis.   Electronically Signed   By: Monte Fantasia  M.D.   On: 08/16/2014 15:48   US Thoracentesis Asp Pleural Space W/img Guide  08/16/2014   INDICATION: Symptomatic right sided pleural effusion  EXAM: US THORACENTESIS ASP PLEURAL SPACE W/IMG GUIDE  COMPARISON:  Chest CT 08/16/14.  MEDICATIONS: None  COMPLICATIONS: None immediate  TECHNIQUE: Informed written consent was obtained from the patient after a discussion of the risks, benefits and alternatives to treatment. A timeout was performed prior to the initiation of the procedure.  Initial ultrasound scanning demonstrates a right pleural effusion. The lower chest was prepped and draped in the usual sterile fashion. 1% lidocaine was used for local anesthesia.  An ultrasound image was saved for documentation purposes. A 6 Fr Safe-T-Centesis catheter was introduced. The thoracentesis was performed. The catheter was removed and a dressing was applied. The patient tolerated the procedure well without immediate post procedural complication. The patient was escorted to have an upright chest radiograph. Procedure was stopped early secondary to pain with remaining pleural fluid seen.  FINDINGS: A total of approximately 600 ml of yellow fluid was removed. Requested samples were sent to the laboratory.  IMPRESSION: Successful ultrasound-guided right sided thoracentesis yielding 600 ml of pleural fluid.  Read By:  Tsosie Billing PA-C   Electronically Signed   By: Aletta Edouard M.D.   On: 08/16/2014 11:05    CBC  Recent Labs Lab 08/16/14 0112 08/16/14 0121 08/16/14 1730  08/17/14 0555  WBC 11.9*  --  9.9 11.3*  HGB 12.7 15.3* 11.4* 12.1  HCT 37.9 45.0 34.0* 36.0  PLT 324  --  321 326  MCV 91.8  --  90.9 92.5  MCH 30.8  --  30.5 31.1  MCHC 33.5  --  33.5 33.6  RDW 14.6  --  14.6 15.0  LYMPHSABS 1.4  --   --   --   MONOABS 0.8  --   --   --   EOSABS 0.6  --   --   --   BASOSABS 0.1  --   --   --     Chemistries   Recent Labs Lab 08/16/14 0112 08/16/14 0121 08/16/14 1730 08/17/14 0555 08/17/14 1040  NA 129* 129* 130* 133*  --   K 4.7 4.7 4.7 5.9* 5.0  CL 85* 91* 88* 94*  --   CO2 24  --  24 22  --   GLUCOSE 233* 239* 166* 205*  --   BUN 63* 58* 75* 36*  --   CREATININE 12.41* 11.40* 13.51* 8.62*  --   CALCIUM 9.2  --  8.4 8.4  --   AST 22  --   --   --   --   ALT 20  --   --   --   --   ALKPHOS 90  --   --   --   --   BILITOT 0.6  --   --   --   --    ------------------------------------------------------------------------------------------------------------------ estimated creatinine clearance is 7.1 mL/min (by C-G formula based on Cr of 8.62). ------------------------------------------------------------------------------------------------------------------ No results for input(s): HGBA1C in the last 72 hours. ------------------------------------------------------------------------------------------------------------------ No results for input(s): CHOL, HDL, LDLCALC, TRIG, CHOLHDL, LDLDIRECT in the last 72 hours. ------------------------------------------------------------------------------------------------------------------ No results for input(s): TSH, T4TOTAL, T3FREE, THYROIDAB in the last 72 hours.  Invalid input(s): FREET3 ------------------------------------------------------------------------------------------------------------------ No results for input(s): VITAMINB12, FOLATE, FERRITIN, TIBC, IRON, RETICCTPCT in the last 72 hours.  Coagulation profile  Recent Labs Lab 08/16/14 0112  INR 1.22    No results for  input(s): DDIMER in the last 72 hours.  Cardiac Enzymes No results for input(s): CKMB, TROPONINI, MYOGLOBIN in the last 168 hours.  Invalid input(s): CK ------------------------------------------------------------------------------------------------------------------ Invalid input(s): [place]  08/16/14 0651 08/16/14 1149 08/16/14 2138 08/17/14 1116  GLUCAP 198* 171* 148* 310*     Hays Dunnigan M.D. [facility] 08/17/2014, 12:37 PM  Pager: [phone]   Between 7am to 7pm - call Pager - [phone]  After 7pm go to [email or web address] - password [email or web address]  Call night coverage person covering after 7pm

## 2014-08-17 NOTE — Progress Notes (Signed)
[facility] Progress Note     Subjective: Pt has no pain, nausea or vomiting.  Sitting up in the chair eating soft foods and tolerating them well.  She says her back pain is resolved since the pleuracentesis.    Objective: Vital signs in last 24 hours: Temp:  [98.1 F (36.7 C)-99.4 F (37.4 C)] 99.1 F (37.3 C) (04/26 0356) Pulse Rate:  [81-116] 109 (04/26 0356) Resp:  [17-20] 19 (04/26 0356) BP: (71-147)/(45-90) 118/50 mmHg (04/26 0356) SpO2:  [94 %-100 %] 94 % (04/26 0356) Weight:  [69.6 kg (153 lb 7 oz)-72 kg (158 lb 11.7 oz)] 69.7 kg (153 lb 10.6 oz) (04/26 0356) Last BM Date: 08/15/14  Intake/Output from previous day: 04/25 0701 - 04/26 0700 In: 240 [P.O.:240] Out: 2302  Intake/Output this shift:    PE: Gen:  Alert, NAD, pleasant Abd: Soft, NT/ND, +BS, no HSM   Lab Results:   Recent Labs  08/16/14 1730 08/17/14 0555  WBC 9.9 11.3*  HGB 11.4* 12.1  HCT 34.0* 36.0  PLT 321 326   BMET  Recent Labs  08/16/14 1730 08/17/14 0555  NA 130* 133*  K 4.7 5.9*  CL 88* 94*  CO2 24 22  GLUCOSE 166* 205*  BUN 75* 36*  CREATININE 13.51* 8.62*  CALCIUM 8.4 8.4   PT/INR  Recent Labs  08/16/14 0112  LABPROT 15.5*  INR 1.22   CMP     Component Value Date/Time   NA 133* 08/17/2014 0555   K 5.9* 08/17/2014 0555   CL 94* 08/17/2014 0555   CO2 22 08/17/2014 0555   GLUCOSE 205* 08/17/2014 0555   BUN 36* 08/17/2014 0555   CREATININE 8.62* 08/17/2014 0555   CALCIUM 8.4 08/17/2014 0555   PROT 8.8* 08/16/2014 0112   ALBUMIN 2.7* 08/16/2014 1730   AST 22 08/16/2014 0112   ALT 20 08/16/2014 0112   ALKPHOS 90 08/16/2014 0112   BILITOT 0.6 08/16/2014 0112   GFRNONAA 5* 08/17/2014 0555   GFRAA 5* 08/17/2014 0555   Lipase     Component Value Date/Time   LIPASE 46 08/16/2014 0112       Studies/Results: Dg Chest 1 View  08/16/2014   CLINICAL DATA:  Thoracentesis.  EXAM: CHEST  1 VIEW  COMPARISON:  08/16/2014  FINDINGS: Large right pleural  effusion is modestly decreased from previous. The underlying lung is opacified.  Unchanged cardiomegaly and vascular pedicle widening. No pulmonary edema no in the left lung. No overt re-expansion edema in the right lung.  IMPRESSION: Decreased but still large right pleural effusion.  No pneumothorax.   Electronically Signed   By: Monte Fantasia M.D.   On: 08/16/2014 10:49   Dg Chest 2 View  08/16/2014   CLINICAL DATA:  54 year old female with several day history of chest and back pain accompanied by shortness of breath  EXAM: CHEST  2 VIEW  COMPARISON:  Prior chest x-ray 02/25/2014  FINDINGS: Large right layering pleural effusion with associated dense opacification of the right mid and lower chest. Mild pulmonary vascular congestion without edema. Cardiac and mediastinal contours are grossly within normal limits. No focal airspace consolidation in the left lung. No acute osseous abnormality.  IMPRESSION: 1. Large right layering pleural effusion with associated dense right mid and basilar airspace opacity. Findings are favored to reflect pleural fluid with atelectasis. Superimposed infiltrate/ pneumonia and mass are difficult to exclude radiographically. 2. Consider further evaluation with CT scan of the chest with contrast.   Electronically Signed  By: Jacqulynn Cadet M.D.   On: 08/16/2014 02:24   Ct Chest W Contrast  08/16/2014   CLINICAL DATA:  Acute onset of right-sided chest pain and upper abdominal pain. Shortness of breath. Pleural effusion noted on chest radiograph. Initial encounter.  EXAM: CT CHEST WITH CONTRAST  TECHNIQUE: Multidetector CT imaging of the chest was performed during intravenous contrast administration.  CONTRAST:  65mL OMNIPAQUE IOHEXOL 300 MG/ML  SOLN  COMPARISON:  Chest radiograph performed earlier today at 12:40 a.m., and CTA of the chest performed 05/07/2009  FINDINGS: There is a large loculated right-sided pleural effusion, with consolidation of much of the right lung.  Minimal left basilar atelectasis is noted. No definite mass is seen. There is slightly unusual loculation of fluid along the right side of the mediastinum, without definite evidence of a pleural rind. No pneumothorax is seen.  The mediastinum is unremarkable in appearance. No mediastinal lymphadenopathy is seen. No pericardial effusion is identified. The great vessels are grossly unremarkable in appearance. The thyroid gland is unremarkable. No axillary lymphadenopathy is seen.  The visualized portions of the liver and spleen are unremarkable in appearance. There is mild wall thickening and soft tissue inflammation about the gallbladder, raising concern for mild acute cholecystitis. A few stones are seen within the gallbladder. The visualized portions of the pancreas and adrenal glands are unremarkable. Bilateral renal atrophy is noted.  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  No acute osseous abnormalities are identified.  IMPRESSION: 1. Mild wall thickening and soft tissue inflammation about the gallbladder, raising concern for mild acute cholecystitis. Would correlate for associated symptoms. 2. Underlying cholelithiasis noted. 3. Large loculated right-sided pleural effusion, with consolidation of much of the right lung. Minimal left basilar atelectasis noted. No definite mass is seen. Diagnostic thoracentesis could be considered for further evaluation, as deemed clinically appropriate. 4. Bilateral renal atrophy noted. 5. Calcification along the superior mesenteric artery, with mural thrombus and luminal narrowing.   Electronically Signed   By: Garald Balding M.D.   On: 08/16/2014 04:58   Ir Fluoro Guide Cv Line Left  08/16/2014   CLINICAL DATA:  Poor intravenous access and history of end-stage renal disease. The patient requires IV access for antibiotics and other medications.  EXAM: NON-TUNNELED CENTRAL VENOUS CATHETER PLACEMENT WITH  ULTRASOUND AND FLUOROSCOPIC GUIDANCE  FLUOROSCOPY TIME:  1 minute and 14 seconds.  PROCEDURE: The procedure, risks, benefits, and alternatives were explained to the patient. Questions regarding the procedure were encouraged and answered. The patient understands and consents to the procedure.  Ultrasound was performed of both right and left neck. The left neck was prepped with chlorhexidine in a sterile fashion, and a sterile drape was applied covering the operative field. Maximum barrier sterile technique with sterile gowns and gloves were used for the procedure. A time-out was performed prior to the procedure. Local anesthesia was provided with 1% lidocaine.  After creating a small venotomy incision, a 21 gauge needle was advanced into the left external jugular vein under direct, real-time ultrasound guidance. Ultrasound image documentation was performed. A guidewire was advanced into the vein.  A 4 French micropuncture dilator was advanced over the guidewire.  Final catheter positioning was confirmed and documented with a fluoroscopic spot image. Venous access was aspirated and flushed with sterile saline. A dressing was applied over the catheter exit site.  COMPLICATIONS: None.  No pneumothorax.  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 A small  left external jugular vein was identifiable by ultrasound. No external jugular vein was visualized on the right.  After establishing access into the left external jugular vein, a guidewire would only advanced to the level of the subclavian vein. There was good return of blood and the catheter flushed well. The catheter tip lies in the left subclavian vein on completion.  IMPRESSION: Placement of non-tunneled central venous catheter via the left external jugular vein. The catheter tip lies in the left subclavian vein. The catheter is ready for immediate use.   Electronically Signed   By: Aletta Edouard M.D.   On:  08/16/2014 17:10   Ir US Guide Vasc Access Left  08/16/2014   CLINICAL DATA:  Poor intravenous access and history of end-stage renal disease. The patient requires IV access for antibiotics and other medications.  EXAM: NON-TUNNELED CENTRAL VENOUS CATHETER PLACEMENT WITH ULTRASOUND AND FLUOROSCOPIC GUIDANCE  FLUOROSCOPY TIME:  1 minute and 14 seconds.  PROCEDURE: The procedure, risks, benefits, and alternatives were explained to the patient. Questions regarding the procedure were encouraged and answered. The patient understands and consents to the procedure.  Ultrasound was performed of both right and left neck. The left neck was prepped with chlorhexidine in a sterile fashion, and a sterile drape was applied covering the operative field. Maximum barrier sterile technique with sterile gowns and gloves were used for the procedure. A time-out was performed prior to the procedure. Local anesthesia was provided with 1% lidocaine.  After creating a small venotomy incision, a 21 gauge needle was advanced into the left external jugular vein under direct, real-time ultrasound guidance. Ultrasound image documentation was performed. A guidewire was advanced into the vein.  A 4 French micropuncture dilator was advanced over the guidewire.  Final catheter positioning was confirmed and documented with a fluoroscopic spot image. Venous access was aspirated and flushed with sterile saline. A dressing was applied over the catheter exit site.  COMPLICATIONS: None.  No pneumothorax.  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 A small left external jugular vein was identifiable by ultrasound. No external jugular vein was visualized on the right.  After establishing access into the left external jugular vein, a guidewire would only advanced to the level of the subclavian vein. There was good return of blood and the catheter flushed well. The catheter tip lies in the left subclavian  vein on completion.  IMPRESSION: Placement of non-tunneled central venous catheter via the left external jugular vein. The catheter tip lies in the left subclavian vein. The catheter is ready for immediate use.   Electronically Signed   By: Aletta Edouard M.D.   On: 08/16/2014 17:10   US Abdomen Limited Ruq  08/16/2014   CLINICAL DATA:  Cholecystitis  EXAM: US ABDOMEN LIMITED - RIGHT UPPER QUADRANT  COMPARISON:  None.  FINDINGS: Gallbladder:  Cholelithiasis. There is circumferential wall thickening to 4 mm without focal tenderness or pericholecystic edema.  Common bile duct:  Diameter: 5-6 mm  Liver:  No focal lesion identified. Within normal limits in parenchymal echogenicity. Antegrade flow in the hepatic and portal venous system.  Atrophic and echogenic right kidney consistent with patient's history of end-stage renal disease. Known right pleural effusion.  IMPRESSION: Cholelithiasis and gallbladder wall thickening. No gallbladder distention or focal tenderness to suggest obstruction/acute cholecystitis.   Electronically Signed   By: Monte Fantasia M.D.   On: 08/16/2014 15:48   US Thoracentesis Asp Pleural Space W/img Guide  08/16/2014   INDICATION: Symptomatic  right sided pleural effusion  EXAM: US THORACENTESIS ASP PLEURAL SPACE W/IMG GUIDE  COMPARISON:  Chest CT 08/16/14.  MEDICATIONS: None  COMPLICATIONS: None immediate  TECHNIQUE: Informed written consent was obtained from the patient after a discussion of the risks, benefits and alternatives to treatment. A timeout was performed prior to the initiation of the procedure.  Initial ultrasound scanning demonstrates a right pleural effusion. The lower chest was prepped and draped in the usual sterile fashion. 1% lidocaine was used for local anesthesia.  An ultrasound image was saved for documentation purposes. A 6 Fr Safe-T-Centesis catheter was introduced. The thoracentesis was performed. The catheter was removed and a dressing was applied. The patient  tolerated the procedure well without immediate post procedural complication. The patient was escorted to have an upright chest radiograph. Procedure was stopped early secondary to pain with remaining pleural fluid seen.  FINDINGS: A total of approximately 600 ml of yellow fluid was removed. Requested samples were sent to the laboratory.  IMPRESSION: Successful ultrasound-guided right sided thoracentesis yielding 600 ml of pleural fluid.  Read By:  Tsosie Billing PA-C   Electronically Signed   By: Aletta Edouard M.D.   On: 08/16/2014 11:05    Anti-infectives: Anti-infectives    Start     Dose/Rate Route Frequency Ordered Stop   08/16/14 1600  vancomycin (VANCOCIN) IVPB 750 mg/150 ml premix     750 mg 150 mL/hr over 60 Minutes Intravenous Every M-W-F (Hemodialysis) 08/16/14 1134     08/16/14 1200  piperacillin-tazobactam (ZOSYN) IVPB 2.25 g     2.25 g 100 mL/hr over 30 Minutes Intravenous 3 times per day 08/16/14 1134     08/16/14 1145  vancomycin (VANCOCIN) 1,500 mg in sodium chloride 0.9 % 500 mL IVPB     1,500 mg 250 mL/hr over 120 Minutes Intravenous NOW 08/16/14 1134 08/16/14 1520       Assessment/Plan Cholelithiasis Gallbladder edema -CT CHEST done and worrisome for cholecystitis in the setting of accompanying symptoms. The patient is asymptomatic. No RUQ abdominal pain, no N/V. No precipitating food's. Pt's LFT's are completely normal, her WBC was mildly elevated at 11.3, but this is likely explained by her pleural effusion and ?empyema. Having a right pleural effusion can often make the gallbladder secondarily look edematous. -US shows cholelithiasis with GB wall thickening, no distension or focal tenderness to suggest obstruction/acute cholecystitis -will sign off, no surgical interventions needed.  Follow up with Dr. Georgette Dover in the office if gallbladder symptoms arise (symptomatic currently). Loculated right pleural effusion with consolidation -s/p thoracentesis, results  pending -On vanc/zosyn Leukocytosis 11.3 ESRD - Cr. 8.62 after HD   LOS: 1 day    DORT, [place] Athanas 08/17/2014, 8:35 AM Pager: [phone]

## 2014-08-17 NOTE — Care Management Note (Signed)
    Page 1 of 1   08/19/2014     3:13:43 PM CARE MANAGEMENT NOTE 08/19/2014  Patient:  DEAMBER, BUCKHALTER   Account Number:  [id number]  Date Initiated:  08/17/2014  Documentation initiated by:  Marvetta Gibbons  Subjective/Objective Assessment:   Pt admitted with abd pain     Action/Plan:   PTA pt lived at home- has PCS aide (with [facility]) for 3 hrs/day m-f through Medicaid benefits- uses walker at home   Anticipated DC Date:  08/18/2014   Anticipated DC Plan:  [place]  CM consult      Choice offered to / List presented to:             Status of service:  Completed, signed off Medicare Important Message given?  YES (If response is "NO", the following Medicare IM given date fields will be blank) Date Medicare IM given:  08/19/2014 Medicare IM given by:  Marvetta Gibbons Date Additional Medicare IM given:   Additional Medicare IM given by:    Discharge Disposition:  HOME/SELF CARE  Per UR Regulation:  Reviewed for med. necessity/level of care/duration of stay  If discussed at [place] of Stay Meetings, dates discussed:    Comments:

## 2014-08-18 LAB — CBC
HCT: 34.1 % — ABNORMAL LOW (ref 36.0–46.0)
HCT: 31.3 % — ABNORMAL LOW (ref 36.0–46.0)
Hemoglobin: 11.6 g/dL — ABNORMAL LOW (ref 12.0–15.0)
Hemoglobin: 10.5 g/dL — ABNORMAL LOW (ref 12.0–15.0)
MCH: 31 pg (ref 26.0–34.0)
MCH: 30.5 pg (ref 26.0–34.0)
MCHC: 34 g/dL (ref 30.0–36.0)
MCHC: 33.5 g/dL (ref 30.0–36.0)
MCV: 91.2 fL (ref 78.0–100.0)
MCV: 91 fL (ref 78.0–100.0)
PLATELETS: 285 10*3/uL (ref 150–400)
PLATELETS: 301 10*3/uL (ref 150–400)
RBC: 3.74 MIL/uL — ABNORMAL LOW (ref 3.87–5.11)
RBC: 3.44 MIL/uL — ABNORMAL LOW (ref 3.87–5.11)
RDW: 14.6 % (ref 11.5–15.5)
RDW: 14.3 % (ref 11.5–15.5)
WBC: 10.6 10*3/uL — AB (ref 4.0–10.5)
WBC: 11.2 10*3/uL — ABNORMAL HIGH (ref 4.0–10.5)

## 2014-08-18 LAB — BASIC METABOLIC PANEL
ANION GAP: 17 — AB (ref 5–15)
BUN: 52 mg/dL — ABNORMAL HIGH (ref 6–23)
CALCIUM: 8 mg/dL — AB (ref 8.4–10.5)
CHLORIDE: 90 mmol/L — AB (ref 96–112)
CO2: 21 mmol/L (ref 19–32)
Creatinine, Ser: 10.86 mg/dL — ABNORMAL HIGH (ref 0.50–1.10)
GFR calc Af Amer: 4 mL/min — ABNORMAL LOW (ref >90)
GFR calc non Af Amer: 4 mL/min — ABNORMAL LOW (ref >90)
Glucose, Bld: 194 mg/dL — ABNORMAL HIGH (ref 70–99)
Potassium: 4.6 mmol/L (ref 3.5–5.1)
SODIUM: 128 mmol/L — AB (ref 135–145)

## 2014-08-18 LAB — GLUCOSE, CAPILLARY
Glucose-Capillary: 179 mg/dL — ABNORMAL HIGH (ref 70–99)
Glucose-Capillary: 273 mg/dL — ABNORMAL HIGH (ref 70–99)
Glucose-Capillary: 135 mg/dL — ABNORMAL HIGH (ref 70–99)

## 2014-08-18 LAB — MAGNESIUM: MAGNESIUM: 2.3 mg/dL (ref 1.5–2.5)

## 2014-08-18 MED ORDER — POLYETHYLENE GLYCOL 3350 17 G PO PACK
17.0000 g | PACK | Freq: Every day | ORAL | Status: DC
Start: 2014-08-18 — End: 2014-08-21
  Administered 2014-08-18 – 2014-08-19 (×2): 17 g via ORAL
  Filled 2014-08-18 (×4): qty 1

## 2014-08-18 NOTE — Progress Notes (Signed)
OT cancellation    08/18/14 1539  OT Visit Information  Last OT Received On 08/18/14  Reason Eval/Treat Not Completed Patient at procedure or test/ unavailable. Pt in HD.   Roseanne Reno, OTR/L [phone]

## 2014-08-18 NOTE — Progress Notes (Signed)
Patient ID: [name], female   DOB: 02-Nov-1960, 54 y.o.   MRN: [id number]  [facility] Progress Note   Assessment/ Plan:   1. Large R Pleural Effusion : Status post thoracentesis, data indicative of an exudative pleural effusion by Light's criteria. Cultures remain negative and CT scan of the chest without associated pneumonia/mass. Agree with empiric vancomycin and fortaz for 1 week at HD 2. ESRD - HD today per her usual OP HD schedule- monitor post-HD as she had some intra-HD BP drop. 3. Hypertension/volume - low BP intra-HD, UF reduced. 4. Anemia -hemoglobin stable, continue to monitor-no overt loss  5. Metabolic bone disease - No vit d Binder with meals / fu ca phos labs 6. Nutrition - renal Diet carb mod / Renal vitamin  Subjective:   Reports to be feeling fair and denies any chest pain or shortness of breath   Objective:   BP 99/57 mmHg  Pulse 110  Temp(Src) 98.8 F (37.1 C) (Oral)  Resp 20  Ht 5\' 2"  (1.575 m)  Wt 71.6 kg (157 lb 13.6 oz)  BMI 28.86 kg/m2  SpO2 95%  Physical Exam: Gen: somewhat lethargic on HD (just had hypotension) NOM:[facility] regular tachycardia, s1 and s2 normal Resp:Decreased right base, otherwise clear NOB:SJGG, obese, NT Ext:No LE edema  Labs: BMET  Recent Labs Lab 08/16/14 0112 08/16/14 0121 08/16/14 1730 08/17/14 0555 08/17/14 1040 08/18/14 0429  NA 129* 129* 130* 133*  --  128*  K 4.7 4.7 4.7 5.9* 5.0 4.6  CL 85* 91* 88* 94*  --  90*  CO2 24  --  24 22  --  21  GLUCOSE 233* 239* 166* 205*  --  194*  BUN 63* 58* 75* 36*  --  52*  CREATININE 12.41* 11.40* 13.51* 8.62*  --  10.86*  CALCIUM 9.2  --  8.4 8.4  --  8.0*  PHOS  --   --  6.4*  --   --   --    CBC  Recent Labs Lab 08/16/14 0112  08/16/14 1730 08/17/14 0555 08/18/14 0429 08/18/14 0910  WBC 11.9*  --  9.9 11.3* 10.6* 11.2*  NEUTROABS 9.1*  --   --   --   --   --   HGB 12.7  < > 11.4* 12.1 11.6* 10.5*  HCT 37.9  < > 34.0* 36.0 34.1* 31.3*   MCV 91.8  --  90.9 92.5 91.2 91.0  PLT 324  --  321 326 285 301  < > = values in this interval not displayed.  Medications:    . amLODipine  10 mg Oral QHS  . aspirin EC  81 mg Oral Daily  . clopidogrel  75 mg Oral Daily  . insulin aspart  0-15 Units Subcutaneous TID WC  . insulin aspart  0-5 Units Subcutaneous QHS  . multivitamin  1 tablet Oral Daily  . neomycin-bacitracin-polymyxin  1 application Topical Daily  . piperacillin-tazobactam (ZOSYN)  IV  2.25 g Intravenous 3 times per day  . sodium chloride  500 mL Intravenous Once  . vancomycin  750 mg Intravenous Q M,W,F-HD   Elmarie Shiley, MD 08/18/2014, 11:09 AM

## 2014-08-18 NOTE — Procedures (Signed)
Patient seen on Hemodialysis. QB 300, UF goal 3.5L Treatment adjusted as needed.  Elmarie Shiley MD [facility]. Office # [phone] Pager # [phone] 11:16 AM

## 2014-08-18 NOTE — Progress Notes (Signed)
PT Cancellation Note  Patient Details Name: [name] MRN: [id number] DOB: July 24, 1960   Cancelled Treatment:    Reason Eval/Treat Not Completed: Patient at procedure or test/unavailable. Came to see pt and she is in dialysis.   Schawn Byas 08/18/2014, 11:42 AM Pager [phone]

## 2014-08-18 NOTE — Progress Notes (Signed)
PT Cancellation Note  Patient Details Name: [name] MRN: [id number] DOB: Nov 25, 1960   Cancelled Treatment:    Reason Eval/Treat Not Completed: Fatigue/lethargy limiting ability to participate. Attempted PT eval again this afternoon and pt too fatigued to participate from HD session earlier in the day. Will continue to follow and complete evaluation when able.    Rolinda Roan 08/18/2014, 2:48 PM   Rolinda Roan, PT, DPT Acute Rehabilitation Services Pager: [phone]

## 2014-08-18 NOTE — Progress Notes (Signed)
[facility]                                                                              Patient Demographics  [name], is a 54 y.o. female, DOB - 1960-12-24, FTD:[id number]  Admit date - 08/15/2014   Admitting Physician Etta Quill, DO  Outpatient Primary MD for the patient is Chari Manning, NP  LOS - 2   Chief Complaint  Patient presents with  . Abdominal Pain       Brief HPI   Patient is a 54 y.o. female with h/o ESRD, dialysis MWF, presents to the ED with complaints of right upper quadrant abdominal pain and right lower chest pain, describing it as gas pain. Patient reported symptoms have been ongoing for the past few days. She had been taking Rolaids without much relief. She also reported being short of breath on the day of admission.     Assessment & Plan    Principal Problem:   Right upper Abdominal pain - CT of the chest showed mild wall thickening and soft tissue inflammation about the gallbladder, raising concern for mild acute cholecystitis, underlying the cholelithiasis. Large loculated right-sided pleural effusion with consolidation of the right lung - Surgery consult called, right upper quadrant ultrasound did not show acute cholecystitis, surgery signed off  - Lipase normal    Loculated Right pleural effusion with consolidation - Thoracentesis done, cultures so far negative, continue IV antibiotics for now data indicative of an exudative pleural effusion by Light's criteria. Cultures remain negative and CT scan of the chest without associated pneumonia/mass.  empiric vancomycin and fortaz for 1 week at HD     End stage renal disease on dialysis - Nephrology following, HD today per her usual OP HD schedule  intra-HD BP drop    DM (diabetes mellitus) type II controlled peripheral vascular disorder - Continue sliding-scale insulin while inpatient  Hypertension - Currently controlled, continue amlodipine  Code Status: Full  code  Family Communication: Discussed in detail with the patient, all imaging results, lab results explained to the patient    Disposition Plan: Hopefully DC home tomorrow  pending pleural fluid cultures remain negative. pt too fatigued to participate in PT from HD session earlier in the day  Time Spent in minutes   25 minutes  Procedures  CT angiogram of the chest Thoracentesis  Consults   Interventional radiology Surgery  DVT Prophylaxis   SCD's  Medications  Scheduled Meds: . amLODipine  10 mg Oral QHS  . aspirin EC  81 mg Oral Daily  . clopidogrel  75 mg Oral Daily  . insulin aspart  0-15 Units Subcutaneous TID WC  . insulin aspart  0-5 Units Subcutaneous QHS  . multivitamin  1 tablet Oral Daily  . neomycin-bacitracin-polymyxin  1 application Topical Daily  . piperacillin-tazobactam (ZOSYN)  IV  2.25 g Intravenous 3 times per day  . sodium chloride  500 mL Intravenous Once  . vancomycin  750 mg Intravenous Q M,W,F-HD   Continuous Infusions:  PRN Meds:.acetaminophen, diphenhydrAMINE, traMADol   Antibiotics   Anti-infectives    Start     Dose/Rate Route Frequency  Ordered Stop   08/16/14 1600  vancomycin (VANCOCIN) IVPB 750 mg/150 ml premix     750 mg 150 mL/hr over 60 Minutes Intravenous Every M-W-F (Hemodialysis) 08/16/14 1134     08/16/14 1200  piperacillin-tazobactam (ZOSYN) IVPB 2.25 g     2.25 g 100 mL/hr over 30 Minutes Intravenous 3 times per day 08/16/14 1134     08/16/14 1145  vancomycin (VANCOCIN) 1,500 mg in sodium chloride 0.9 % 500 mL IVPB     1,500 mg 250 mL/hr over 120 Minutes Intravenous NOW 08/16/14 1134 08/16/14 1520        Subjective:   [name] was seen and examined today.  Patient denies any chest pain, shortness of breath, no abdominal pain this morning, low-grade temp +. Fatigue/lethargy limiting ability to participate in PT, BP soft  Objective:   Blood pressure 125/54, pulse 110, temperature 98 F (36.7 C), temperature  source Oral, resp. rate 19, height 5\' 2"  (1.575 m), weight 71.6 kg (157 lb 13.6 oz), SpO2 98 %.  Wt Readings from Last 3 Encounters:  08/18/14 71.6 kg (157 lb 13.6 oz)  12/10/13 74.844 kg (165 lb)  10/20/13 73.936 kg (163 lb)     Intake/Output Summary (Last 24 hours) at 08/18/14 1619 Last data filed at 08/18/14 1301  Gross per 24 hour  Intake    480 ml  Output   2550 ml  Net  -2070 ml    Exam  General: Alert and oriented x 3, NAD, speaks softly  HEENT:  PERRLA, EOMI, Anicteic Sclera, mucous membranes moist.   Neck: Supple, no JVD, no masses  CVS: S1 S2 clear, RRR  Respiratory: Decreased breath sounds on the right, CTA on the left  Abdomen: Soft, NT, ND, + bowel sounds  Ext: no cyanosis clubbing or edema  Neuro: AAOx3, Cr N's II- XII. Strength 5/5 upper and lower extremities bilaterally  Skin: No rashes  Psych: Normal affect and demeanor, alert and oriented x3    Data Review   Micro Results Recent Results (from the past 240 hour(s))  Body fluid culture     Status: None (Preliminary result)   Collection Time: 08/16/14 10:16 AM  Result Value Ref Range Status   Specimen Description FLUID PLEURAL RIGHT  Final   Special Requests NONE  Final   Gram Stain   Final    RARE WBC PRESENT,BOTH PMN AND MONONUCLEAR NO ORGANISMS SEEN Performed at [facility]    Culture   Final    NO GROWTH 2 DAYS Performed at [facility]    Report Status PENDING  Incomplete    Radiology Reports Dg Chest 1 View  08/16/2014   CLINICAL DATA:  Thoracentesis.  EXAM: CHEST  1 VIEW  COMPARISON:  08/16/2014  FINDINGS: Large right pleural effusion is modestly decreased from previous. The underlying lung is opacified.  Unchanged cardiomegaly and vascular pedicle widening. No pulmonary edema no in the left lung. No overt re-expansion edema in the right lung.  IMPRESSION: Decreased but still large right pleural effusion.  No pneumothorax.   Electronically Signed   By: Monte Fantasia M.D.   On: 08/16/2014 10:49   Dg Chest 2 View  08/16/2014   CLINICAL DATA:  54 year old female with several day history of chest and back pain accompanied by shortness of breath  EXAM: CHEST  2 VIEW  COMPARISON:  Prior chest x-ray 02/25/2014  FINDINGS: Large right layering pleural effusion with associated dense opacification of the right mid and lower chest.  Mild pulmonary vascular congestion without edema. Cardiac and mediastinal contours are grossly within normal limits. No focal airspace consolidation in the left lung. No acute osseous abnormality.  IMPRESSION: 1. Large right layering pleural effusion with associated dense right mid and basilar airspace opacity. Findings are favored to reflect pleural fluid with atelectasis. Superimposed infiltrate/ pneumonia and mass are difficult to exclude radiographically. 2. Consider further evaluation with CT scan of the chest with contrast.   Electronically Signed   By: Jacqulynn Cadet M.D.   On: 08/16/2014 02:24   Ct Chest W Contrast  08/16/2014   CLINICAL DATA:  Acute onset of right-sided chest pain and upper abdominal pain. Shortness of breath. Pleural effusion noted on chest radiograph. Initial encounter.  EXAM: CT CHEST WITH CONTRAST  TECHNIQUE: Multidetector CT imaging of the chest was performed during intravenous contrast administration.  CONTRAST:  65mL OMNIPAQUE IOHEXOL 300 MG/ML  SOLN  COMPARISON:  Chest radiograph performed earlier today at 12:40 a.m., and CTA of the chest performed 05/07/2009  FINDINGS: There is a large loculated right-sided pleural effusion, with consolidation of much of the right lung. Minimal left basilar atelectasis is noted. No definite mass is seen. There is slightly unusual loculation of fluid along the right side of the mediastinum, without definite evidence of a pleural rind. No pneumothorax is seen.  The mediastinum is unremarkable in appearance. No mediastinal lymphadenopathy is seen. No pericardial effusion is  identified. The great vessels are grossly unremarkable in appearance. The thyroid gland is unremarkable. No axillary lymphadenopathy is seen.  The visualized portions of the liver and spleen are unremarkable in appearance. There is mild wall thickening and soft tissue inflammation about the gallbladder, raising concern for mild acute cholecystitis. A few stones are seen within the gallbladder. The visualized portions of the pancreas and adrenal glands are unremarkable. Bilateral renal atrophy is noted.  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  No acute osseous abnormalities are identified.  IMPRESSION: 1. Mild wall thickening and soft tissue inflammation about the gallbladder, raising concern for mild acute cholecystitis. Would correlate for associated symptoms. 2. Underlying cholelithiasis noted. 3. Large loculated right-sided pleural effusion, with consolidation of much of the right lung. Minimal left basilar atelectasis noted. No definite mass is seen. Diagnostic thoracentesis could be considered for further evaluation, as deemed clinically appropriate. 4. Bilateral renal atrophy noted. 5. Calcification along the superior mesenteric artery, with mural thrombus and luminal narrowing.   Electronically Signed   By: Garald Balding M.D.   On: 08/16/2014 04:58   Ir Fluoro Guide Cv Line Left  08/16/2014   CLINICAL DATA:  Poor intravenous access and history of end-stage renal disease. The patient requires IV access for antibiotics and other medications.  EXAM: NON-TUNNELED CENTRAL VENOUS CATHETER PLACEMENT WITH ULTRASOUND AND FLUOROSCOPIC GUIDANCE  FLUOROSCOPY TIME:  1 minute and 14 seconds.  PROCEDURE: The procedure, risks, benefits, and alternatives were explained to the patient. Questions regarding the procedure were encouraged and answered. The patient understands and consents to the procedure.  Ultrasound was performed of both right and left neck.  The left neck was prepped with chlorhexidine in a sterile fashion, and a sterile drape was applied covering the operative field. Maximum barrier sterile technique with sterile gowns and gloves were used for the procedure. A time-out was performed prior to the procedure. Local anesthesia was provided with 1% lidocaine.  After creating a small venotomy incision, a 21 gauge needle was advanced into the left external  jugular vein under direct, real-time ultrasound guidance. Ultrasound image documentation was performed. A guidewire was advanced into the vein.  A 4 French micropuncture dilator was advanced over the guidewire.  Final catheter positioning was confirmed and documented with a fluoroscopic spot image. Venous access was aspirated and flushed with sterile saline. A dressing was applied over the catheter exit site.  COMPLICATIONS: None.  No pneumothorax.  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 A small left external jugular vein was identifiable by ultrasound. No external jugular vein was visualized on the right.  After establishing access into the left external jugular vein, a guidewire would only advanced to the level of the subclavian vein. There was good return of blood and the catheter flushed well. The catheter tip lies in the left subclavian vein on completion.  IMPRESSION: Placement of non-tunneled central venous catheter via the left external jugular vein. The catheter tip lies in the left subclavian vein. The catheter is ready for immediate use.   Electronically Signed   By: Aletta Edouard M.D.   On: 08/16/2014 17:10   Ir US Guide Vasc Access Left  08/16/2014   CLINICAL DATA:  Poor intravenous access and history of end-stage renal disease. The patient requires IV access for antibiotics and other medications.  EXAM: NON-TUNNELED CENTRAL VENOUS CATHETER PLACEMENT WITH ULTRASOUND AND FLUOROSCOPIC GUIDANCE  FLUOROSCOPY TIME:  1 minute and 14 seconds.   PROCEDURE: The procedure, risks, benefits, and alternatives were explained to the patient. Questions regarding the procedure were encouraged and answered. The patient understands and consents to the procedure.  Ultrasound was performed of both right and left neck. The left neck was prepped with chlorhexidine in a sterile fashion, and a sterile drape was applied covering the operative field. Maximum barrier sterile technique with sterile gowns and gloves were used for the procedure. A time-out was performed prior to the procedure. Local anesthesia was provided with 1% lidocaine.  After creating a small venotomy incision, a 21 gauge needle was advanced into the left external jugular vein under direct, real-time ultrasound guidance. Ultrasound image documentation was performed. A guidewire was advanced into the vein.  A 4 French micropuncture dilator was advanced over the guidewire.  Final catheter positioning was confirmed and documented with a fluoroscopic spot image. Venous access was aspirated and flushed with sterile saline. A dressing was applied over the catheter exit site.  COMPLICATIONS: None.  No pneumothorax.  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 A small left external jugular vein was identifiable by ultrasound. No external jugular vein was visualized on the right.  After establishing access into the left external jugular vein, a guidewire would only advanced to the level of the subclavian vein. There was good return of blood and the catheter flushed well. The catheter tip lies in the left subclavian vein on completion.  IMPRESSION: Placement of non-tunneled central venous catheter via the left external jugular vein. The catheter tip lies in the left subclavian vein. The catheter is ready for immediate use.   Electronically Signed   By: Aletta Edouard M.D.   On: 08/16/2014 17:10   US Abdomen Limited Ruq  08/16/2014   CLINICAL DATA:  Cholecystitis   EXAM: US ABDOMEN LIMITED - RIGHT UPPER QUADRANT  COMPARISON:  None.  FINDINGS: Gallbladder:  Cholelithiasis. There is circumferential wall thickening to 4 mm without focal tenderness or pericholecystic edema.  Common bile duct:  Diameter: 5-6 mm  Liver:  No focal lesion identified.  Within normal limits in parenchymal echogenicity. Antegrade flow in the hepatic and portal venous system.  Atrophic and echogenic right kidney consistent with patient's history of end-stage renal disease. Known right pleural effusion.  IMPRESSION: Cholelithiasis and gallbladder wall thickening. No gallbladder distention or focal tenderness to suggest obstruction/acute cholecystitis.   Electronically Signed   By: Monte Fantasia M.D.   On: 08/16/2014 15:48   US Thoracentesis Asp Pleural Space W/img Guide  08/16/2014   INDICATION: Symptomatic right sided pleural effusion  EXAM: US THORACENTESIS ASP PLEURAL SPACE W/IMG GUIDE  COMPARISON:  Chest CT 08/16/14.  MEDICATIONS: None  COMPLICATIONS: None immediate  TECHNIQUE: Informed written consent was obtained from the patient after a discussion of the risks, benefits and alternatives to treatment. A timeout was performed prior to the initiation of the procedure.  Initial ultrasound scanning demonstrates a right pleural effusion. The lower chest was prepped and draped in the usual sterile fashion. 1% lidocaine was used for local anesthesia.  An ultrasound image was saved for documentation purposes. A 6 Fr Safe-T-Centesis catheter was introduced. The thoracentesis was performed. The catheter was removed and a dressing was applied. The patient tolerated the procedure well without immediate post procedural complication. The patient was escorted to have an upright chest radiograph. Procedure was stopped early secondary to pain with remaining pleural fluid seen.  FINDINGS: A total of approximately 600 ml of yellow fluid was removed. Requested samples were sent to the laboratory.  IMPRESSION:  Successful ultrasound-guided right sided thoracentesis yielding 600 ml of pleural fluid.  Read By:  Tsosie Billing PA-C   Electronically Signed   By: Aletta Edouard M.D.   On: 08/16/2014 11:05    CBC  Recent Labs Lab 08/16/14 0112 08/16/14 0121 08/16/14 1730 08/17/14 0555 08/18/14 0429 08/18/14 0910  WBC 11.9*  --  9.9 11.3* 10.6* 11.2*  HGB 12.7 15.3* 11.4* 12.1 11.6* 10.5*  HCT 37.9 45.0 34.0* 36.0 34.1* 31.3*  PLT 324  --  321 326 285 301  MCV 91.8  --  90.9 92.5 91.2 91.0  MCH 30.8  --  30.5 31.1 31.0 30.5  MCHC 33.5  --  33.5 33.6 34.0 33.5  RDW 14.6  --  14.6 15.0 14.6 14.3  LYMPHSABS 1.4  --   --   --   --   --   MONOABS 0.8  --   --   --   --   --   EOSABS 0.6  --   --   --   --   --   BASOSABS 0.1  --   --   --   --   --     Chemistries   Recent Labs Lab 08/16/14 0112 08/16/14 0121 08/16/14 1730 08/17/14 0555 08/17/14 1040 08/18/14 0429 08/18/14 0910  NA 129* 129* 130* 133*  --  128*  --   K 4.7 4.7 4.7 5.9* 5.0 4.6  --   CL 85* 91* 88* 94*  --  90*  --   CO2 24  --  24 22  --  21  --   GLUCOSE 233* 239* 166* 205*  --  194*  --   BUN 63* 58* 75* 36*  --  52*  --   CREATININE 12.41* 11.40* 13.51* 8.62*  --  10.86*  --   CALCIUM 9.2  --  8.4 8.4  --  8.0*  --   MG  --   --   --   --   --   --  2.3  AST 22  --   --   --   --   --   --   ALT 20  --   --   --   --   --   --   ALKPHOS 90  --   --   --   --   --   --   BILITOT 0.6  --   --   --   --   --   --    ------------------------------------------------------------------------------------------------------------------ estimated creatinine clearance is 5.7 mL/min (by C-G formula based on Cr of 10.86). ------------------------------------------------------------------------------------------------------------------ No results for input(s): HGBA1C in the last 72 hours. ------------------------------------------------------------------------------------------------------------------ No results for  input(s): CHOL, HDL, LDLCALC, TRIG, CHOLHDL, LDLDIRECT in the last 72 hours. ------------------------------------------------------------------------------------------------------------------ No results for input(s): TSH, T4TOTAL, T3FREE, THYROIDAB in the last 72 hours.  Invalid input(s): FREET3 ------------------------------------------------------------------------------------------------------------------ No results for input(s): VITAMINB12, FOLATE, FERRITIN, TIBC, IRON, RETICCTPCT in the last 72 hours.  Coagulation profile  Recent Labs Lab 08/16/14 0112  INR 1.22    No results for input(s): DDIMER in the last 72 hours.  Cardiac Enzymes No results for input(s): CKMB, TROPONINI, MYOGLOBIN in the last 168 hours.  Invalid input(s): CK ------------------------------------------------------------------------------------------------------------------ Invalid input(s): [place]  08/16/14 1149 08/16/14 2138 08/17/14 1116 08/17/14 1629 08/17/14 2200 08/18/14 0613  GLUCAP 171* 148* 310* 204* 217* 179Reyne Dumas M.D. [facility] 08/18/2014, 4:19 PM  Pager: [phone]   Between 7am to 7pm - call Pager - [phone]  After 7pm go to [email or web address] - password [email or web address]  Call night coverage person covering after 7pm

## 2014-08-19 LAB — BASIC METABOLIC PANEL
Anion gap: 15 (ref 5–15)
BUN: 23 mg/dL (ref 6–23)
CALCIUM: 8.8 mg/dL (ref 8.4–10.5)
CO2: 25 mmol/L (ref 19–32)
Chloride: 95 mmol/L — ABNORMAL LOW (ref 96–112)
Creatinine, Ser: 6.79 mg/dL — ABNORMAL HIGH (ref 0.50–1.10)
GFR calc Af Amer: 7 mL/min — ABNORMAL LOW (ref >90)
GFR, EST NON AFRICAN AMERICAN: 6 mL/min — AB (ref >90)
Glucose, Bld: 117 mg/dL — ABNORMAL HIGH (ref 70–99)
Potassium: 4.3 mmol/L (ref 3.5–5.1)
SODIUM: 135 mmol/L (ref 135–145)

## 2014-08-19 LAB — GLUCOSE, CAPILLARY
Glucose-Capillary: 96 mg/dL (ref 70–99)
Glucose-Capillary: 318 mg/dL — ABNORMAL HIGH (ref 70–99)
Glucose-Capillary: 238 mg/dL — ABNORMAL HIGH (ref 70–99)
Glucose-Capillary: 200 mg/dL — ABNORMAL HIGH (ref 70–99)

## 2014-08-19 LAB — CBC
HCT: 32.4 % — ABNORMAL LOW (ref 36.0–46.0)
Hemoglobin: 10.6 g/dL — ABNORMAL LOW (ref 12.0–15.0)
MCH: 31 pg (ref 26.0–34.0)
MCHC: 32.7 g/dL (ref 30.0–36.0)
MCV: 94.7 fL (ref 78.0–100.0)
Platelets: 347 10*3/uL (ref 150–400)
RBC: 3.42 MIL/uL — AB (ref 3.87–5.11)
RDW: 14.6 % (ref 11.5–15.5)
WBC: 11.7 10*3/uL — ABNORMAL HIGH (ref 4.0–10.5)

## 2014-08-19 LAB — BODY FLUID CULTURE: CULTURE: NO GROWTH

## 2014-08-19 MED ORDER — HEPARIN BOLUS VIA INFUSION
4000.0000 [IU] | Freq: Once | INTRAVENOUS | Status: AC
  Administered 2014-08-19: 4000 [IU] via INTRAVENOUS
  Filled 2014-08-19: qty 4000

## 2014-08-19 MED ORDER — HEPARIN (PORCINE) IN NACL 100-0.45 UNIT/ML-% IJ SOLN
1150.0000 [IU]/h | INTRAMUSCULAR | Status: DC
  Administered 2014-08-19: 1150 [IU]/h via INTRAVENOUS
  Filled 2014-08-19 (×4): qty 250

## 2014-08-19 MED ORDER — VANCOMYCIN HCL IN DEXTROSE 750-5 MG/150ML-% IV SOLN: 750.0000 mg | INTRAVENOUS | Status: AC

## 2014-08-19 MED ORDER — POLYETHYLENE GLYCOL 3350 17 G PO PACK: 17.0000 g | PACK | Freq: Every day | ORAL | Status: DC

## 2014-08-19 MED ORDER — CEFTAZIDIME 1 G IJ SOLR: 1.0000 g | INTRAMUSCULAR | Status: AC

## 2014-08-19 MED ORDER — AMLODIPINE BESYLATE 10 MG PO TABS: 10.0000 mg | ORAL_TABLET | Freq: Every day | ORAL | Status: DC

## 2014-08-19 NOTE — Progress Notes (Signed)
ANTIBIOTIC CONSULT NOTE - FOLLOW UP  Pharmacy Consult for Vancomycin and Zosyn Indication: loculated pleural effusion  Allergies  Allergen Reactions  . Doxycycline Itching  . Peroxyl [Hydrogen Peroxide] Hives and Rash    Patient Measurements: Height: 5\' 2"  (157.5 cm) Weight: 157 lb 13.6 oz (71.6 kg) IBW/kg (Calculated) : 50.1  Vital Signs: Temp: 98.6 F (37 C) (04/28 0635) Temp Source: Oral (04/28 0635) BP: 117/59 mmHg (04/28 1016) Pulse Rate: 95 (04/28 0635) Intake/Output from previous day: 04/27 0701 - 04/28 0700 In: 480 [P.O.:480] Out: 2550  Intake/Output from this shift:    Labs:  Recent Labs  08/17/14 0555 08/18/14 0429 08/18/14 0910 08/19/14 0426  WBC 11.3* 10.6* 11.2* 11.7*  HGB 12.1 11.6* 10.5* 10.6*  PLT 326 285 301 347  CREATININE 8.62* 10.86*  --  6.79*   Estimated Creatinine Clearance: 9.1 mL/min (by C-G formula based on Cr of 6.79). No results for input(s): VANCOTROUGH, VANCOPEAK, VANCORANDOM, GENTTROUGH, GENTPEAK, GENTRANDOM, TOBRATROUGH, TOBRAPEAK, TOBRARND, AMIKACINPEAK, AMIKACINTROU, AMIKACIN in the last 72 hours.   Microbiology: Recent Results (from the past 720 hour(s))  Body fluid culture     Status: None (Preliminary result)   Collection Time: 08/16/14 10:16 AM  Result Value Ref Range Status   Specimen Description FLUID PLEURAL RIGHT  Final   Special Requests NONE  Final   Gram Stain   Final    RARE WBC PRESENT,BOTH PMN AND MONONUCLEAR NO ORGANISMS SEEN Performed at [facility]    Culture   Final    NO GROWTH 2 DAYS Performed at [facility]    Report Status PENDING  Incomplete    Medical History: Past Medical History  Diagnosis Date  . ESRD (end stage renal disease)   . Hypertension   . CVA (cerebral infarction)     right parietal 05/19/2000  . Vaginal bleeding   . Hyperparathyroidism   . Pneumonia   . Stroke     2002,no residual  . Peripheral vascular disease   . GERD (gastroesophageal reflux disease)    . Anemia   . DM (diabetes mellitus) type II controlled peripheral vascular disorder   . Slip/trip w/o falling due to stepping on object, subs July  2014    Medications:  Scheduled:  . amLODipine  10 mg Oral QHS  . aspirin EC  81 mg Oral Daily  . clopidogrel  75 mg Oral Daily  . insulin aspart  0-15 Units Subcutaneous TID WC  . insulin aspart  0-5 Units Subcutaneous QHS  . multivitamin  1 tablet Oral Daily  . neomycin-bacitracin-polymyxin  1 application Topical Daily  . piperacillin-tazobactam (ZOSYN)  IV  2.25 g Intravenous 3 times per day  . polyethylene glycol  17 g Oral Daily  . sodium chloride  500 mL Intravenous Once  . vancomycin  750 mg Intravenous Q M,W,F-HD   Assessment: 54 yo F presented to ED 4/24 with R sided CP, upper abd pain for the last few days.  Pt found to have pleural effusion.  PMH: ESRD MWF, HTN, CVA, PVD, GERD, anemia, DM  Infectious Disease:  loculated pleral effusion, cannot ro PNA, plan 1 week of abx after DC   Vanc 4/25 >> Zosyn 4/25 >>  4/25 pleural fluid cx no growth  Renal:  ESRD with HD on MWF.   Goal of Therapy:  Vancomycin pre-HD level 20-25 mcg/ml Renal dose adjustment of antibiotics  Plan:  Vancomycin 750 mg IV qHD  Continue current zosyn dose,  Follow up SCr, UOP,  cultures, clinical course and adjust as clinically indicated.  Thank you for allowing pharmacy to be a part of this patients care team.  Rowe Robert Pharm.D., BCPS, AQ-Cardiology Clinical Pharmacist 08/19/2014 10:44 AM Pager: [phone] Phone: [phone]
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
## 2014-08-19 NOTE — Progress Notes (Signed)
Pt refusing to be discharged and requesting to speak with MD, MD paged and aware. Kathleen Argue S 1:45 PM

## 2014-08-19 NOTE — Progress Notes (Signed)
Medicare Important Message given? YES  (If response is "NO", the following Medicare IM given date fields will be blank)  Date Medicare IM given: 08/19/14 Medicare IM given by:  Edgard Debord  

## 2014-08-19 NOTE — Discharge Summary (Addendum)
Physician Discharge Summary  [name] MRN: [id number] DOB/AGE: October 02, 1960 54 y.o.  PCP: Chari Manning, NP   Admit date: 08/15/2014 Discharge date: 08/19/2014  Discharge Diagnoses:     Principal Problem:   Abdominal pain Active Problems:   End stage renal disease on dialysis   DM (diabetes mellitus) type II controlled peripheral vascular disorder   Pleural effusion, right   Chest pain   Cholecystitis   Hypoxia  Follow-up recommendations Patient to continue with vancomycin and Fortaz with hemodialysis for 1 more week Follow-up with PCP in 3-5 days Follow labs with hemodialysis     Medication List    TAKE these medications        amLODipine 10 MG tablet  Commonly known as:  NORVASC  Take 1 tablet (10 mg total) by mouth at bedtime.     aspirin 81 MG tablet  Take 1 tablet (81 mg total) by mouth daily.     CALCIUM-VITAMIN D PO  Take 1 tablet by mouth 3 (three) times daily with meals.     cefTAZidime 1 G injection  Commonly known as:  FORTAZ  Inject 1 g into the muscle every Monday, Wednesday, and Friday.     diphenhydrAMINE 25 MG tablet  Commonly known as:  BENADRYL  Take 25 mg by mouth every 6 (six) hours as needed (Muscle spasm in feet).     glipiZIDE 10 MG tablet  Commonly known as:  GLUCOTROL  Take 0.5 tablets (5 mg total) by mouth daily.     multivitamin Tabs tablet  Take 1 tablet by mouth daily.     neomycin-bacitracin-polymyxin [phone] ointment  Apply 1 application topically daily. Apply to foot     PLAVIX PO  Take 75 mg by mouth daily.     polyethylene glycol packet  Commonly known as:  MIRALAX / GLYCOLAX  Take 17 g by mouth daily.     traMADol 50 MG tablet  Commonly known as:  ULTRAM  Take 50 mg by mouth 2 (two) times daily as needed for moderate pain (for foot pain). For foot pain     TYLENOL PO  Take 325-650 mg by mouth See admin instructions. Uses at dialysis Mon, Wed, Fridays each week for muscle spasms     Vancomycin 750  MG/150ML Soln  Commonly known as:  VANCOCIN  Inject 150 mLs (750 mg total) into the vein every Monday, Wednesday, and Friday with hemodialysis.     VITAMIN B-12 PO  Take 1 tablet by mouth 4 (four) times a week. Non dialysis days. Cain Saupe, Sat and Sunday        Discharge Condition:stable   Disposition: 01-Home or Self Care   Consults:  General surgery Nephrology   Significant Diagnostic Studies: Dg Chest 1 View  08/16/2014   CLINICAL DATA:  Thoracentesis.  EXAM: CHEST  1 VIEW  COMPARISON:  08/16/2014  FINDINGS: Large right pleural effusion is modestly decreased from previous. The underlying lung is opacified.  Unchanged cardiomegaly and vascular pedicle widening. No pulmonary edema no in the left lung. No overt re-expansion edema in the right lung.  IMPRESSION: Decreased but still large right pleural effusion.  No pneumothorax.   Electronically Signed   By: Monte Fantasia M.D.   On: 08/16/2014 10:49   Dg Chest 2 View  08/16/2014   CLINICAL DATA:  54 year old female with several day history of chest and back pain accompanied by shortness of breath  EXAM: CHEST  2 VIEW  COMPARISON:  Prior chest  x-ray 02/25/2014  FINDINGS: Large right layering pleural effusion with associated dense opacification of the right mid and lower chest. Mild pulmonary vascular congestion without edema. Cardiac and mediastinal contours are grossly within normal limits. No focal airspace consolidation in the left lung. No acute osseous abnormality.  IMPRESSION: 1. Large right layering pleural effusion with associated dense right mid and basilar airspace opacity. Findings are favored to reflect pleural fluid with atelectasis. Superimposed infiltrate/ pneumonia and mass are difficult to exclude radiographically. 2. Consider further evaluation with CT scan of the chest with contrast.   Electronically Signed   By: Jacqulynn Cadet M.D.   On: 08/16/2014 02:24   Ct Chest W Contrast  08/16/2014   CLINICAL DATA:  Acute  onset of right-sided chest pain and upper abdominal pain. Shortness of breath. Pleural effusion noted on chest radiograph. Initial encounter.  EXAM: CT CHEST WITH CONTRAST  TECHNIQUE: Multidetector CT imaging of the chest was performed during intravenous contrast administration.  CONTRAST:  32mL OMNIPAQUE IOHEXOL 300 MG/ML  SOLN  COMPARISON:  Chest radiograph performed earlier today at 12:40 a.m., and CTA of the chest performed 05/07/2009  FINDINGS: There is a large loculated right-sided pleural effusion, with consolidation of much of the right lung. Minimal left basilar atelectasis is noted. No definite mass is seen. There is slightly unusual loculation of fluid along the right side of the mediastinum, without definite evidence of a pleural rind. No pneumothorax is seen.  The mediastinum is unremarkable in appearance. No mediastinal lymphadenopathy is seen. No pericardial effusion is identified. The great vessels are grossly unremarkable in appearance. The thyroid gland is unremarkable. No axillary lymphadenopathy is seen.  The visualized portions of the liver and spleen are unremarkable in appearance. There is mild wall thickening and soft tissue inflammation about the gallbladder, raising concern for mild acute cholecystitis. A few stones are seen within the gallbladder. The visualized portions of the pancreas and adrenal glands are unremarkable. Bilateral renal atrophy is noted.  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  No acute osseous abnormalities are identified.  IMPRESSION: 1. Mild wall thickening and soft tissue inflammation about the gallbladder, raising concern for mild acute cholecystitis. Would correlate for associated symptoms. 2. Underlying cholelithiasis noted. 3. Large loculated right-sided pleural effusion, with consolidation of much of the right lung. Minimal left basilar atelectasis noted. No definite mass is seen. Diagnostic  thoracentesis could be considered for further evaluation, as deemed clinically appropriate. 4. Bilateral renal atrophy noted. 5. Calcification along the superior mesenteric artery, with mural thrombus and luminal narrowing.   Electronically Signed   By: Garald Balding M.D.   On: 08/16/2014 04:58   Ir Fluoro Guide Cv Line Left  08/16/2014   CLINICAL DATA:  Poor intravenous access and history of end-stage renal disease. The patient requires IV access for antibiotics and other medications.  EXAM: NON-TUNNELED CENTRAL VENOUS CATHETER PLACEMENT WITH ULTRASOUND AND FLUOROSCOPIC GUIDANCE  FLUOROSCOPY TIME:  1 minute and 14 seconds.  PROCEDURE: The procedure, risks, benefits, and alternatives were explained to the patient. Questions regarding the procedure were encouraged and answered. The patient understands and consents to the procedure.  Ultrasound was performed of both right and left neck. The left neck was prepped with chlorhexidine in a sterile fashion, and a sterile drape was applied covering the operative field. Maximum barrier sterile technique with sterile gowns and gloves were used for the procedure. A time-out was performed prior to the procedure. Local anesthesia was provided  with 1% lidocaine.  After creating a small venotomy incision, a 21 gauge needle was advanced into the left external jugular vein under direct, real-time ultrasound guidance. Ultrasound image documentation was performed. A guidewire was advanced into the vein.  A 4 French micropuncture dilator was advanced over the guidewire.  Final catheter positioning was confirmed and documented with a fluoroscopic spot image. Venous access was aspirated and flushed with sterile saline. A dressing was applied over the catheter exit site.  COMPLICATIONS: None.  No pneumothorax.  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 A small left external jugular vein was identifiable by ultrasound. No external  jugular vein was visualized on the right.  After establishing access into the left external jugular vein, a guidewire would only advanced to the level of the subclavian vein. There was good return of blood and the catheter flushed well. The catheter tip lies in the left subclavian vein on completion.  IMPRESSION: Placement of non-tunneled central venous catheter via the left external jugular vein. The catheter tip lies in the left subclavian vein. The catheter is ready for immediate use.   Electronically Signed   By: Aletta Edouard M.D.   On: 08/16/2014 17:10   Ir US Guide Vasc Access Left  08/16/2014   CLINICAL DATA:  Poor intravenous access and history of end-stage renal disease. The patient requires IV access for antibiotics and other medications.  EXAM: NON-TUNNELED CENTRAL VENOUS CATHETER PLACEMENT WITH ULTRASOUND AND FLUOROSCOPIC GUIDANCE  FLUOROSCOPY TIME:  1 minute and 14 seconds.  PROCEDURE: The procedure, risks, benefits, and alternatives were explained to the patient. Questions regarding the procedure were encouraged and answered. The patient understands and consents to the procedure.  Ultrasound was performed of both right and left neck. The left neck was prepped with chlorhexidine in a sterile fashion, and a sterile drape was applied covering the operative field. Maximum barrier sterile technique with sterile gowns and gloves were used for the procedure. A time-out was performed prior to the procedure. Local anesthesia was provided with 1% lidocaine.  After creating a small venotomy incision, a 21 gauge needle was advanced into the left external jugular vein under direct, real-time ultrasound guidance. Ultrasound image documentation was performed. A guidewire was advanced into the vein.  A 4 French micropuncture dilator was advanced over the guidewire.  Final catheter positioning was confirmed and documented with a fluoroscopic spot image. Venous access was aspirated and flushed with sterile saline.  A dressing was applied over the catheter exit site.  COMPLICATIONS: None.  No pneumothorax.  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 A small left external jugular vein was identifiable by ultrasound. No external jugular vein was visualized on the right.  After establishing access into the left external jugular vein, a guidewire would only advanced to the level of the subclavian vein. There was good return of blood and the catheter flushed well. The catheter tip lies in the left subclavian vein on completion.  IMPRESSION: Placement of non-tunneled central venous catheter via the left external jugular vein. The catheter tip lies in the left subclavian vein. The catheter is ready for immediate use.   Electronically Signed   By: Aletta Edouard M.D.   On: 08/16/2014 17:10   US Abdomen Limited Ruq  08/16/2014   CLINICAL DATA:  Cholecystitis  EXAM: US ABDOMEN LIMITED - RIGHT UPPER QUADRANT  COMPARISON:  None.  FINDINGS: Gallbladder:  Cholelithiasis. There is circumferential wall thickening to 4 mm without  focal tenderness or pericholecystic edema.  Common bile duct:  Diameter: 5-6 mm  Liver:  No focal lesion identified. Within normal limits in parenchymal echogenicity. Antegrade flow in the hepatic and portal venous system.  Atrophic and echogenic right kidney consistent with patient's history of end-stage renal disease. Known right pleural effusion.  IMPRESSION: Cholelithiasis and gallbladder wall thickening. No gallbladder distention or focal tenderness to suggest obstruction/acute cholecystitis.   Electronically Signed   By: Marnee Spring M.D.   On: 08/16/2014 15:48   US Thoracentesis Asp Pleural Space W/img Guide  08/16/2014   INDICATION: Symptomatic right sided pleural effusion  EXAM: US THORACENTESIS ASP PLEURAL SPACE W/IMG GUIDE  COMPARISON:  Chest CT 08/16/14.  MEDICATIONS: None  COMPLICATIONS: None immediate  TECHNIQUE: Informed written consent was  obtained from the patient after a discussion of the risks, benefits and alternatives to treatment. A timeout was performed prior to the initiation of the procedure.  Initial ultrasound scanning demonstrates a right pleural effusion. The lower chest was prepped and draped in the usual sterile fashion. 1% lidocaine was used for local anesthesia.  An ultrasound image was saved for documentation purposes. A 6 Fr Safe-T-Centesis catheter was introduced. The thoracentesis was performed. The catheter was removed and a dressing was applied. The patient tolerated the procedure well without immediate post procedural complication. The patient was escorted to have an upright chest radiograph. Procedure was stopped early secondary to pain with remaining pleural fluid seen.  FINDINGS: A total of approximately 600 ml of yellow fluid was removed. Requested samples were sent to the laboratory.  IMPRESSION: Successful ultrasound-guided right sided thoracentesis yielding 600 ml of pleural fluid.  Read By:  Pattricia Boss PA-C   Electronically Signed   By: Irish Lack M.D.   On: 08/16/2014 11:05      Microbiology: Recent Results (from the past 240 hour(s))  Body fluid culture     Status: None (Preliminary result)   Collection Time: 08/16/14 10:16 AM  Result Value Ref Range Status   Specimen Description FLUID PLEURAL RIGHT  Final   Special Requests NONE  Final   Gram Stain   Final    RARE WBC PRESENT,BOTH PMN AND MONONUCLEAR NO ORGANISMS SEEN Performed at [facility]    Culture   Final    NO GROWTH 2 DAYS Performed at [facility]    Report Status PENDING  Incomplete     Labs: Results for orders placed or performed during the hospital encounter of 08/15/14 (from the past 48 hour(s))  Glucose, capillary     Status: Abnormal   Collection Time: 08/17/14  4:29 PM  Result Value Ref Range   Glucose-Capillary 204 (H) 70 - 99 mg/dL   Comment 1 Notify RN    Comment 2 Document in Chart   Glucose,  capillary     Status: Abnormal   Collection Time: 08/17/14 10:00 PM  Result Value Ref Range   Glucose-Capillary 217 (H) 70 - 99 mg/dL   Comment 1 Notify RN    Comment 2 Document in Chart   CBC     Status: Abnormal   Collection Time: 08/18/14  4:29 AM  Result Value Ref Range   WBC 10.6 (H) 4.0 - 10.5 K/uL   RBC 3.74 (L) 3.87 - 5.11 MIL/uL   Hemoglobin 11.6 (L) 12.0 - 15.0 g/dL   HCT 49.6 (L) 05.1 - 00.4 %   MCV 91.2 78.0 - 100.0 fL   MCH 31.0 26.0 - 34.0 pg  MCHC 34.0 30.0 - 36.0 g/dL   RDW 14.6 11.5 - 15.5 %   Platelets 285 150 - 400 K/uL  Basic metabolic panel     Status: Abnormal   Collection Time: 08/18/14  4:29 AM  Result Value Ref Range   Sodium 128 (L) 135 - 145 mmol/L   Potassium 4.6 3.5 - 5.1 mmol/L   Chloride 90 (L) 96 - 112 mmol/L   CO2 21 19 - 32 mmol/L   Glucose, Bld 194 (H) 70 - 99 mg/dL   BUN 52 (H) 6 - 23 mg/dL   Creatinine, Ser 10.86 (H) 0.50 - 1.10 mg/dL   Calcium 8.0 (L) 8.4 - 10.5 mg/dL   GFR calc non Af Amer 4 (L) >90 mL/min   GFR calc Af Amer 4 (L) >90 mL/min    Comment: (NOTE) The eGFR has been calculated using the CKD EPI equation. This calculation has not been validated in all clinical situations. eGFR's persistently <90 mL/min signify possible Chronic Kidney Disease.    Anion gap 17 (H) 5 - 15  Glucose, capillary     Status: Abnormal   Collection Time: 08/18/14  6:13 AM  Result Value Ref Range   Glucose-Capillary 179 (H) 70 - 99 mg/dL  Magnesium     Status: None   Collection Time: 08/18/14  9:10 AM  Result Value Ref Range   Magnesium 2.3 1.5 - 2.5 mg/dL  CBC     Status: Abnormal   Collection Time: 08/18/14  9:10 AM  Result Value Ref Range   WBC 11.2 (H) 4.0 - 10.5 K/uL   RBC 3.44 (L) 3.87 - 5.11 MIL/uL   Hemoglobin 10.5 (L) 12.0 - 15.0 g/dL   HCT 31.3 (L) 36.0 - 46.0 %   MCV 91.0 78.0 - 100.0 fL   MCH 30.5 26.0 - 34.0 pg   MCHC 33.5 30.0 - 36.0 g/dL   RDW 14.3 11.5 - 15.5 %   Platelets 301 150 - 400 K/uL  Glucose, capillary      Status: Abnormal   Collection Time: 08/18/14  4:53 PM  Result Value Ref Range   Glucose-Capillary 273 (H) 70 - 99 mg/dL   Comment 1 Notify RN    Comment 2 Document in Chart   Glucose, capillary     Status: Abnormal   Collection Time: 08/18/14  9:39 PM  Result Value Ref Range   Glucose-Capillary 135 (H) 70 - 99 mg/dL   Comment 1 Notify RN   CBC     Status: Abnormal   Collection Time: 08/19/14  4:26 AM  Result Value Ref Range   WBC 11.7 (H) 4.0 - 10.5 K/uL    Comment: WHITE COUNT CONFIRMED ON SMEAR   RBC 3.42 (L) 3.87 - 5.11 MIL/uL   Hemoglobin 10.6 (L) 12.0 - 15.0 g/dL   HCT 32.4 (L) 36.0 - 46.0 %   MCV 94.7 78.0 - 100.0 fL   MCH 31.0 26.0 - 34.0 pg   MCHC 32.7 30.0 - 36.0 g/dL   RDW 14.6 11.5 - 15.5 %   Platelets 347 150 - 400 K/uL  Basic metabolic panel     Status: Abnormal   Collection Time: 08/19/14  4:26 AM  Result Value Ref Range   Sodium 135 135 - 145 mmol/L    Comment: DELTA CHECK NOTED   Potassium 4.3 3.5 - 5.1 mmol/L   Chloride 95 (L) 96 - 112 mmol/L   CO2 25 19 - 32 mmol/L   Glucose, Bld 117 (H) 70 -  99 mg/dL   BUN 23 6 - 23 mg/dL    Comment: DELTA CHECK NOTED   Creatinine, Ser 6.79 (H) 0.50 - 1.10 mg/dL    Comment: DELTA CHECK NOTED   Calcium 8.8 8.4 - 10.5 mg/dL   GFR calc non Af Amer 6 (L) >90 mL/min   GFR calc Af Amer 7 (L) >90 mL/min    Comment: (NOTE) The eGFR has been calculated using the CKD EPI equation. This calculation has not been validated in all clinical situations. eGFR's persistently <90 mL/min signify possible Chronic Kidney Disease.    Anion gap 15 5 - 15  Glucose, capillary     Status: None   Collection Time: 08/19/14  6:31 AM  Result Value Ref Range   Glucose-Capillary 96 70 - 99 mg/dL  Glucose, capillary     Status: Abnormal   Collection Time: 08/19/14 10:31 AM  Result Value Ref Range   Glucose-Capillary 318 (H) 70 - 99 mg/dL   Comment 1 Notify RN      HPI :Gracyn Allor is a 54 y.o. female with h/o ESRD, dialysis MWF,  presents to the ED with c/o right sided chest pain, upper abdominal pain, which she describes as "gas". Symptoms have been ongoing for the past couple of days. She took rolaids earlier today without relief. She has also been SOB since earlier today.   She also c/o back mid right sided back pain which radiates around to the front. She also c/o SOB/dyspnea. Symptoms have been ongoing for the past couple of days. Pain is precipitated by movement and feels like something is catching. She tried rolaids without relief. She was found to have a large right sided pleural effusion by CT chest, IR completed pleuracentesis and her symptoms have since improved. She denies RUQ pain 0/10 pain, but still c/o right mid back pain.   HOSPITAL COURSE:   Right upper Abdominal pain - CT of the chest showed mild wall thickening and soft tissue inflammation about the gallbladder, raising concern for mild acute cholecystitis, underlying the cholelithiasis. Large loculated right-sided pleural effusion with consolidation of the right lung - Surgery consult called, right upper quadrant ultrasound did not show acute cholecystitis, surgery signed off  - Lipase normal   Loculated Right pleural effusion with consolidation - Thoracentesis done, cultures so far negative, continue IV antibiotics for now data indicative of an exudative pleural effusion by Light's criteria. Cultures remain negative and CT scan of the chest without associated pneumonia/mass. empiric vancomycin and fortaz for 1 week at HD  Bilateral arm pain and leg pain Venous Doppler was ordered to rule out DVT in all 4 extremities based on patient's concerns If this is negative the patient will be discharged home today DVT noted in the right jugular and left brachial veins. Discussed with Dr Bridgett Larsson who recommended anticoagulation, no further intervention needed  Will start the patient on heparin gtt given ESRD and discuss with vascular again in the  morning  Hold ASA tonight    End stage renal disease on dialysis - Nephrology following, HD today per her usual OP HD schedule intra-HD BP drop   DM (diabetes mellitus) type II controlled peripheral vascular disorder - Continue sliding-scale insulin while inpatient Otherwise continue glipizide outpatient  Hypertension - Currently controlled, continue amlodipine  Code Status: Full code  Discharge Exam:   Blood pressure 117/59, pulse 95, temperature 98.6 F (37 C), temperature source Oral, resp. rate 19, height $RemoveBe'5\' 2"'azTZueuZR$  (1.575 m), weight 71.6 kg (157 lb 13.6  oz), SpO2 97 %.   General: Alert and oriented x 3, NAD, speaks softly  HEENT: PERRLA, EOMI, Anicteic Sclera, mucous membranes moist.   Neck: Supple, no JVD, no masses  CVS: S1 S2 clear, RRR  Respiratory: Decreased breath sounds on the right, CTA on the left  Abdomen: Soft, NT, ND, + bowel sounds  Ext: no cyanosis clubbing or edema  Neuro: AAOx3, Cr N's II- XII. Strength 5/5 upper and lower extremities bilaterally  Skin: No rashes  Psych: Normal affect and demeanor, alert and oriented x3       Discharge Instructions    Diet - low sodium heart healthy    Complete by:  As directed      Increase activity slowly    Complete by:  As directed            Follow-up Information    Call Maia Petties., MD.   Specialty:  General Surgery   Why:  As needed for your gallbladder   Contact information:   [place] [place] [address] [phone]       Signed: Reyne Dumas 08/19/2014, 12:28 PM

## 2014-08-19 NOTE — Progress Notes (Signed)
ANTICOAGULATION CONSULT NOTE - Initial Consult  Pharmacy Consult for Heparin Indication: DVT  Allergies  Allergen Reactions  . Doxycycline Itching  . Peroxyl [Hydrogen Peroxide] Hives and Rash    Patient Measurements: Height: 5\' 2"  (157.5 cm) Weight: 157 lb 13.6 oz (71.6 kg) IBW/kg (Calculated) : 50.1 Heparin Dosing Weight: 65.3 kg  Vital Signs: Temp: 98.5 F (36.9 C) (04/28 1319) Temp Source: Oral (04/28 1319) BP: 134/57 mmHg (04/28 1319) Pulse Rate: 97 (04/28 1319)  Labs:  Recent Labs  08/17/14 0555 08/18/14 0429 08/18/14 0910 08/19/14 0426  HGB 12.1 11.6* 10.5* 10.6*  HCT 36.0 34.1* 31.3* 32.4*  PLT 326 285 301 347  CREATININE 8.62* 10.86*  --  6.79*    Estimated Creatinine Clearance: 9.1 mL/min (by C-G formula based on Cr of 6.79).   Medical History: Past Medical History  Diagnosis Date  . ESRD (end stage renal disease)   . Hypertension   . CVA (cerebral infarction)     right parietal 05/19/2000  . Vaginal bleeding   . Hyperparathyroidism   . Pneumonia   . Stroke     2002,no residual  . Peripheral vascular disease   . GERD (gastroesophageal reflux disease)   . Anemia   . DM (diabetes mellitus) type II controlled peripheral vascular disorder   . Slip/trip w/o falling due to stepping on object, subs July  2014   Assessment: 54 year old female with ESRD on diaylsis (last HD 4/27) and new DVT in R-jugular and L-brachial viens to start IV heparin. H/H is low-stable. Platelets are within normal limits.   Goal of Therapy:  Heparin level 0.3-0.7 units/ml Monitor platelets by anticoagulation protocol: Yes   Plan:  Heparin bolus 4000 units x1. Heparin drip at 1150 units/hr.  Heparin level in 8 hours.  Daily heparin level and CBC.   Sloan Leiter, PharmD, BCPS Clinical Pharmacist [phone] 08/19/2014,5:58 PM

## 2014-08-19 NOTE — Progress Notes (Signed)
Bilateral lower and upper extremity venous duplex completed.  Bilateral lower extremity:  No evidence of DVT, superficial thrombosis, or Baker's cyst.  Bilateral upper extremity:  DVT noted in the right jugular and left brachial veins.

## 2014-08-19 NOTE — Progress Notes (Signed)
OT Cancellation Note  Patient Details Name: [name] MRN: [id number] DOB: [birth year]   Cancelled Treatment:    Reason Eval/Treat Not Completed: OT screened, no needs identified, will sign off. Per PT pt at Supervision with ambulation and has HHAide for 5 hours a day for ADLs and has no acute OT needs, pt at or near baseline. Acute OT to sign off. Thank you for this referral.   Su Ley, OTR/L Occupational Therapist [phone] (pager)  08/19/2014, 10:38 AM

## 2014-08-19 NOTE — Progress Notes (Signed)
MD paged to discuss discharge plans. Sherrie Mustache 5:45 PM

## 2014-08-19 NOTE — Progress Notes (Signed)
Patient ID: [name], female   DOB: October 13, 1960, 54 y.o.   MRN: [id number]  [facility] Progress Note   Assessment/ Plan:   1. Large R Pleural Effusion : Status post thoracentesis, data indicative of an exudative pleural effusion by Light's criteria. Pleural fluid cultures negative and no indication of pulmonary mass/pneumonia with CT imaging. Would favor empiric treatment with vancomycin/Fortaz for 1 week. 2. ESRD - hemodialysis done yesterday with intradialytic blood pressure drop and postdialysis weakness. Anticipated discharge home today to resume dialysis at her usual outpatient unit tomorrow. 3. Hypertension/volume - low BP intra-HD, UF reduced. 4. Anemia -hemoglobin stable, continue to monitor-no overt loss  5. Metabolic bone disease - No vit d Binder with meals / fu ca phos labs 6. Nutrition - renal Diet carb mod / Renal vitamin  Subjective:   Reports to be feeling better this morning-ambulating around hallways with physical therapy assistance    Objective:   BP 97/53 mmHg  Pulse 95  Temp(Src) 98.6 F (37 C) (Oral)  Resp 19  Ht 5\' 2"  (1.575 m)  Wt 71.6 kg (157 lb 13.6 oz)  BMI 28.86 kg/m2  SpO2 97%  Physical Exam: Gen: Comfortably ambulating around hallways (using rolling walker) CVS: Pulse regular in rate and rhythm Resp: Decreased breath sounds right base otherwise clear to auscultation Abd: Soft, obese, nontender Ext: No lower extremity edema  Labs: BMET  Recent Labs Lab 08/16/14 0112 08/16/14 0121 08/16/14 1730 08/17/14 0555 08/17/14 1040 08/18/14 0429 08/19/14 0426  NA 129* 129* 130* 133*  --  128* 135  K 4.7 4.7 4.7 5.9* 5.0 4.6 4.3  CL 85* 91* 88* 94*  --  90* 95*  CO2 24  --  24 22  --  21 25  GLUCOSE 233* 239* 166* 205*  --  194* 117*  BUN 63* 58* 75* 36*  --  52* 23  CREATININE 12.41* 11.40* 13.51* 8.62*  --  10.86* 6.79*  CALCIUM 9.2  --  8.4 8.4  --  8.0* 8.8  PHOS  --   --  6.4*  --   --   --   --     CBC  Recent Labs Lab 08/16/14 0112  08/17/14 0555 08/18/14 0429 08/18/14 0910 08/19/14 0426  WBC 11.9*  < > 11.3* 10.6* 11.2* 11.7*  NEUTROABS 9.1*  --   --   --   --   --   HGB 12.7  < > 12.1 11.6* 10.5* 10.6*  HCT 37.9  < > 36.0 34.1* 31.3* 32.4*  MCV 91.8  < > 92.5 91.2 91.0 94.7  PLT 324  < > 326 285 301 347  < > = values in this interval not displayed.  Medications:    . amLODipine  10 mg Oral QHS  . aspirin EC  81 mg Oral Daily  . clopidogrel  75 mg Oral Daily  . insulin aspart  0-15 Units Subcutaneous TID WC  . insulin aspart  0-5 Units Subcutaneous QHS  . multivitamin  1 tablet Oral Daily  . neomycin-bacitracin-polymyxin  1 application Topical Daily  . piperacillin-tazobactam (ZOSYN)  IV  2.25 g Intravenous 3 times per day  . polyethylene glycol  17 g Oral Daily  . sodium chloride  500 mL Intravenous Once  . vancomycin  750 mg Intravenous Q M,W,F-HD   Elmarie Shiley, MD 08/19/2014, 9:18 AM

## 2014-08-19 NOTE — Progress Notes (Signed)
DVT noted in the right jugular and left brachial veins. Discussed with Dr Bridgett Larsson who recommended anticoagulation, no further intervention needed  Will start the patient on heparin gtt given ESRD and discuss with vascular again in the morning  Hold ASA tonight

## 2014-08-19 NOTE — Progress Notes (Signed)
Utilization review completed.  

## 2014-08-20 DIAGNOSIS — N185 Chronic kidney disease, stage 5: Secondary | ICD-10-CM

## 2014-08-20 LAB — RENAL FUNCTION PANEL
Albumin: 2.4 g/dL — ABNORMAL LOW (ref 3.5–5.2)
Anion gap: 15 (ref 5–15)
BUN: 43 mg/dL — ABNORMAL HIGH (ref 6–23)
CALCIUM: 8.4 mg/dL (ref 8.4–10.5)
CHLORIDE: 94 mmol/L — AB (ref 96–112)
CO2: 24 mmol/L (ref 19–32)
CREATININE: 10.45 mg/dL — AB (ref 0.50–1.10)
GFR calc Af Amer: 4 mL/min — ABNORMAL LOW (ref >90)
GFR calc non Af Amer: 4 mL/min — ABNORMAL LOW (ref >90)
GLUCOSE: 289 mg/dL — AB (ref 70–99)
PHOSPHORUS: 5.5 mg/dL — AB (ref 2.3–4.6)
Potassium: 3.9 mmol/L (ref 3.5–5.1)
Sodium: 133 mmol/L — ABNORMAL LOW (ref 135–145)

## 2014-08-20 LAB — CBC
HEMATOCRIT: 30.6 % — AB (ref 36.0–46.0)
Hemoglobin: 10.1 g/dL — ABNORMAL LOW (ref 12.0–15.0)
MCH: 30.1 pg (ref 26.0–34.0)
MCHC: 33 g/dL (ref 30.0–36.0)
MCV: 91.3 fL (ref 78.0–100.0)
Platelets: 371 10*3/uL (ref 150–400)
RBC: 3.35 MIL/uL — ABNORMAL LOW (ref 3.87–5.11)
RDW: 14.3 % (ref 11.5–15.5)
WBC: 9 10*3/uL (ref 4.0–10.5)

## 2014-08-20 LAB — GLUCOSE, CAPILLARY
Glucose-Capillary: 138 mg/dL — ABNORMAL HIGH (ref 70–99)
Glucose-Capillary: 145 mg/dL — ABNORMAL HIGH (ref 70–99)
Glucose-Capillary: 327 mg/dL — ABNORMAL HIGH (ref 70–99)
Glucose-Capillary: 109 mg/dL — ABNORMAL HIGH (ref 70–99)
Glucose-Capillary: 204 mg/dL — ABNORMAL HIGH (ref 70–99)

## 2014-08-20 LAB — HEPARIN LEVEL (UNFRACTIONATED)
HEPARIN UNFRACTIONATED: 0.51 [IU]/mL (ref 0.30–0.70)
Heparin Unfractionated: 0.63 IU/mL (ref 0.30–0.70)

## 2014-08-20 NOTE — Progress Notes (Signed)
Patient ID: [name], female   DOB: 10/05/1960, 54 y.o.   MRN: [id number]  [facility] Progress Note   Assessment/ Plan:   1. Large R Pleural Effusion : Status post thoracentesis, data indicative of an exudative pleural effusion by Light's criteria. Pleural fluid cultures negative and no indication of pulmonary mass/pneumonia with CT imaging. On empiric antibiotic therapy with vancomycin and Zosyn. 2. ESRD - hemodialysis will be done today-no acute indications noted. 3. Hypertension/volume - low BP intra-HD, UF reduced. 4. Anemia -hemoglobin stable, continue to monitor-no overt loss  5. Metabolic bone disease - No vit d Binder with meals / fu ca phos labs 6. Nutrition - renal Diet carb mod / Renal vitamin 7. Left brachial vein DVT, right IJ DVT: Currently on heparin drip with plans to start Coumadin (previously on Plavix/aspirin)  Subjective:   Reports to have had a fair night-hand symptoms apparently better    Objective:   BP 99/54 mmHg  Pulse 101  Temp(Src) 99.4 F (37.4 C) (Oral)  Resp 18  Ht 5\' 2"  (1.575 m)  Wt 72.3 kg (159 lb 6.3 oz)  BMI 29.15 kg/m2  SpO2 98%  Physical Exam: Gen: Comfortably resting in bed CVS: Pulse regular tachycardia, S1 and S2 normal Resp: Clear to auscultation, no rales Abd: Soft, obese, nontender Ext: Trace LE edema  Labs: BMET  Recent Labs Lab 08/16/14 0112 08/16/14 0121 08/16/14 1730 08/17/14 0555 08/17/14 1040 08/18/14 0429 08/19/14 0426  NA 129* 129* 130* 133*  --  128* 135  K 4.7 4.7 4.7 5.9* 5.0 4.6 4.3  CL 85* 91* 88* 94*  --  90* 95*  CO2 24  --  24 22  --  21 25  GLUCOSE 233* 239* 166* 205*  --  194* 117*  BUN 63* 58* 75* 36*  --  52* 23  CREATININE 12.41* 11.40* 13.51* 8.62*  --  10.86* 6.79*  CALCIUM 9.2  --  8.4 8.4  --  8.0* 8.8  PHOS  --   --  6.4*  --   --   --   --    CBC  Recent Labs Lab 08/16/14 0112  08/17/14 0555 08/18/14 0429 08/18/14 0910 08/19/14 0426  WBC 11.9*  < > 11.3*  10.6* 11.2* 11.7*  NEUTROABS 9.1*  --   --   --   --   --   HGB 12.7  < > 12.1 11.6* 10.5* 10.6*  HCT 37.9  < > 36.0 34.1* 31.3* 32.4*  MCV 91.8  < > 92.5 91.2 91.0 94.7  PLT 324  < > 326 285 301 347  < > = values in this interval not displayed.  Medications:    . amLODipine  10 mg Oral QHS  . clopidogrel  75 mg Oral Daily  . insulin aspart  0-15 Units Subcutaneous TID WC  . insulin aspart  0-5 Units Subcutaneous QHS  . multivitamin  1 tablet Oral Daily  . neomycin-bacitracin-polymyxin  1 application Topical Daily  . piperacillin-tazobactam (ZOSYN)  IV  2.25 g Intravenous 3 times per day  . polyethylene glycol  17 g Oral Daily  . sodium chloride  500 mL Intravenous Once  . vancomycin  750 mg Intravenous Q M,W,F-HD   Elmarie Shiley, MD 08/20/2014, 9:13 AM

## 2014-08-20 NOTE — Procedures (Signed)
Patient seen on Hemodialysis. QB 400, UF goal 3L Treatment adjusted as needed.  Elmarie Shiley MD [facility]. Office # [phone] Pager # [phone] 3:15 PM

## 2014-08-20 NOTE — Progress Notes (Addendum)
[facility]                                                                              Patient Demographics  [name], is a 54 y.o. female, DOB - Aug 06, 1960, LPF:[id number]  Admit date - 08/15/2014   Admitting Physician Etta Quill, DO  Outpatient Primary MD for the patient is Chari Manning, NP  LOS - 4   Chief Complaint  Patient presents with  . Abdominal Pain       HPI :[name] is a 54 y.o. female with h/o ESRD, dialysis MWF, presents to the ED with c/o right sided chest pain, upper abdominal pain, which she describes as "gas". Symptoms have been ongoing for the past couple of days. She took rolaids earlier today without relief. She has also been SOB since earlier today.  She also c/o back mid right sided back pain which radiates around to the front. She also c/o SOB/dyspnea. Symptoms have been ongoing for the past couple of days. Pain is precipitated by movement and feels like something is catching. She tried rolaids without relief. She was found to have a large right sided pleural effusion by CT chest, IR completed pleuracentesis and her symptoms have since improved. She denies RUQ pain 0/10 pain, but still c/o right mid back pain.   HOSPITAL COURSE:   Right upper Abdominal pain - CT of the chest showed mild wall thickening and soft tissue inflammation about the gallbladder, raising concern for mild acute cholecystitis, underlying the cholelithiasis. Large loculated right-sided pleural effusion with consolidation of the right lung - Surgery consult called, right upper quadrant ultrasound did not show acute cholecystitis, surgery signed off  - Lipase normal   Loculated Right pleural effusion with consolidation - Thoracentesis done, cultures so far negative, continue IV antibiotics for now data indicative of an exudative pleural effusion by Light's criteria. Cultures remain negative and CT scan of the chest without associated  pneumonia/mass. empiric vancomycin and fortaz for 1 week at HD    Bilateral arm pain and leg pain Venous Doppler was ordered to rule out DVT in all 4 extremities based on patient's concerns Patient noted to have DVT  in the right jugular and left brachial veins. Discussed with Dr Bridgett Larsson who recommended anticoagulation,    Started patient on heparin gtt given ESRD and discussed with vascular again in the morning Dr. Oneida Alar Aspirin placed on hold given increased risk of bleeding Dr. fields recommended left central venogram in IR.  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 would not start long-term anticoagulation/coumadin as she has no swelling symptoms in left arm and would not be at risk for PE    End stage renal disease on dialysis - Nephrology following, HD today per her usual OP HD schedule Usually hypotensive after hemodialysis   DM (diabetes mellitus) type II controlled peripheral vascular disorder - Continue sliding-scale insulin while inpatient Otherwise continue glipizide outpatient  Hypertension - Currently controlled, continue amlodipine  Code Status: Full code   Family Communication: Discussed in detail with the patient, all imaging results, lab results explained to the patient    Disposition  Plan: Anticipate discharge home tomorrow, if venogram completed  Time Spent in minutes   25 minutes  Procedures  CT angiogram of the chest Thoracentesis  Consults   Interventional radiology Surgery  DVT Prophylaxis   SCD's  Medications  Scheduled Meds: . amLODipine  10 mg Oral QHS  . clopidogrel  75 mg Oral Daily  . insulin aspart  0-15 Units Subcutaneous TID WC  . insulin aspart  0-5 Units Subcutaneous QHS  . multivitamin  1 tablet Oral Daily  . neomycin-bacitracin-polymyxin  1 application Topical Daily  . piperacillin-tazobactam (ZOSYN)  IV  2.25 g Intravenous 3 times per day  . polyethylene glycol  17 g Oral Daily  . sodium chloride   500 mL Intravenous Once  . vancomycin  750 mg Intravenous Q M,W,F-HD   Continuous Infusions: . heparin 1,150 Units/hr (08/19/14 2051)   PRN Meds:.acetaminophen, diphenhydrAMINE, traMADol   Antibiotics   Anti-infectives    Start     Dose/Rate Route Frequency Ordered Stop   08/19/14 0000  Vancomycin (VANCOCIN) 750 MG/150ML SOLN     750 mg 150 mL/hr over 60 Minutes Intravenous Every M-W-F (Hemodialysis) 08/19/14 1228 08/26/14 2359   08/19/14 0000  cefTAZidime (FORTAZ) 1 G injection     1 g Intramuscular Every M-W-F 08/19/14 1228 08/26/14 2359   08/16/14 1600  vancomycin (VANCOCIN) IVPB 750 mg/150 ml premix     750 mg 150 mL/hr over 60 Minutes Intravenous Every M-W-F (Hemodialysis) 08/16/14 1134     08/16/14 1200  piperacillin-tazobactam (ZOSYN) IVPB 2.25 g     2.25 g 100 mL/hr over 30 Minutes Intravenous 3 times per day 08/16/14 1134     08/16/14 1145  vancomycin (VANCOCIN) 1,500 mg in sodium chloride 0.9 % 500 mL IVPB     1,500 mg 250 mL/hr over 120 Minutes Intravenous NOW 08/16/14 1134 08/16/14 1520        Subjective:   [name] was seen and examined today. Patient complained of bilateral hand pain and change in the color of both her hands, duration unknown, declined to be discharged yesterday  Objective:   Blood pressure 99/54, pulse 101, temperature 99.4 F (37.4 C), temperature source Oral, resp. rate 18, height 5\' 2"  (1.575 m), weight 72.3 kg (159 lb 6.3 oz), SpO2 98 %.  Wt Readings from Last 3 Encounters:  08/20/14 72.3 kg (159 lb 6.3 oz)  12/10/13 74.844 kg (165 lb)  10/20/13 73.936 kg (163 lb)     Intake/Output Summary (Last 24 hours) at 08/20/14 1005 Last data filed at 08/19/14 1700  Gross per 24 hour  Intake    480 ml  Output      0 ml  Net    480 ml    Exam  General: Alert and oriented x 3, NAD, speaks softly  HEENT:  PERRLA, EOMI, Anicteic Sclera, mucous membranes moist.   Neck: Supple, no JVD, no masses  CVS: S1 S2 clear,  RRR  Respiratory: Decreased breath sounds on the right, CTA on the left  Abdomen: Soft, NT, ND, + bowel sounds  Ext: no cyanosis clubbing or edema  Neuro: AAOx3, Cr N's II- XII. Strength 5/5 upper and lower extremities bilaterally  Skin: No rashes  Psych: Normal affect and demeanor, alert and oriented x3    Data Review   Micro Results Recent Results (from the past 240 hour(s))  Body fluid culture     Status: None   Collection Time: 08/16/14 10:16 AM  Result Value Ref Range Status  Specimen Description FLUID PLEURAL RIGHT  Final   Special Requests NONE  Final   Gram Stain   Final    RARE WBC PRESENT,BOTH PMN AND MONONUCLEAR NO ORGANISMS SEEN Performed at [facility]    Culture   Final    NO GROWTH 3 DAYS Performed at [facility]    Report Status 08/19/2014 FINAL  Final    Radiology Reports Dg Chest 1 View  08/16/2014   CLINICAL DATA:  Thoracentesis.  EXAM: CHEST  1 VIEW  COMPARISON:  08/16/2014  FINDINGS: Large right pleural effusion is modestly decreased from previous. The underlying lung is opacified.  Unchanged cardiomegaly and vascular pedicle widening. No pulmonary edema no in the left lung. No overt re-expansion edema in the right lung.  IMPRESSION: Decreased but still large right pleural effusion.  No pneumothorax.   Electronically Signed   By: Monte Fantasia M.D.   On: 08/16/2014 10:49   Dg Chest 2 View  08/16/2014   CLINICAL DATA:  54 year old female with several day history of chest and back pain accompanied by shortness of breath  EXAM: CHEST  2 VIEW  COMPARISON:  Prior chest x-ray 02/25/2014  FINDINGS: Large right layering pleural effusion with associated dense opacification of the right mid and lower chest. Mild pulmonary vascular congestion without edema. Cardiac and mediastinal contours are grossly within normal limits. No focal airspace consolidation in the left lung. No acute osseous abnormality.  IMPRESSION: 1. Large right layering pleural  effusion with associated dense right mid and basilar airspace opacity. Findings are favored to reflect pleural fluid with atelectasis. Superimposed infiltrate/ pneumonia and mass are difficult to exclude radiographically. 2. Consider further evaluation with CT scan of the chest with contrast.   Electronically Signed   By: Jacqulynn Cadet M.D.   On: 08/16/2014 02:24   Ct Chest W Contrast  08/16/2014   CLINICAL DATA:  Acute onset of right-sided chest pain and upper abdominal pain. Shortness of breath. Pleural effusion noted on chest radiograph. Initial encounter.  EXAM: CT CHEST WITH CONTRAST  TECHNIQUE: Multidetector CT imaging of the chest was performed during intravenous contrast administration.  CONTRAST:  43mL OMNIPAQUE IOHEXOL 300 MG/ML  SOLN  COMPARISON:  Chest radiograph performed earlier today at 12:40 a.m., and CTA of the chest performed 05/07/2009  FINDINGS: There is a large loculated right-sided pleural effusion, with consolidation of much of the right lung. Minimal left basilar atelectasis is noted. No definite mass is seen. There is slightly unusual loculation of fluid along the right side of the mediastinum, without definite evidence of a pleural rind. No pneumothorax is seen.  The mediastinum is unremarkable in appearance. No mediastinal lymphadenopathy is seen. No pericardial effusion is identified. The great vessels are grossly unremarkable in appearance. The thyroid gland is unremarkable. No axillary lymphadenopathy is seen.  The visualized portions of the liver and spleen are unremarkable in appearance. There is mild wall thickening and soft tissue inflammation about the gallbladder, raising concern for mild acute cholecystitis. A few stones are seen within the gallbladder. The visualized portions of the pancreas and adrenal glands are unremarkable. Bilateral renal atrophy is noted.  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  No  acute osseous abnormalities are identified.  IMPRESSION: 1. Mild wall thickening and soft tissue inflammation about the gallbladder, raising concern for mild acute cholecystitis. Would correlate for associated symptoms. 2. Underlying cholelithiasis noted. 3. Large loculated right-sided pleural effusion, with consolidation of much of  the right lung. Minimal left basilar atelectasis noted. No definite mass is seen. Diagnostic thoracentesis could be considered for further evaluation, as deemed clinically appropriate. 4. Bilateral renal atrophy noted. 5. Calcification along the superior mesenteric artery, with mural thrombus and luminal narrowing.   Electronically Signed   By: Garald Balding M.D.   On: 08/16/2014 04:58   Ir Fluoro Guide Cv Line Left  08/16/2014   CLINICAL DATA:  Poor intravenous access and history of end-stage renal disease. The patient requires IV access for antibiotics and other medications.  EXAM: NON-TUNNELED CENTRAL VENOUS CATHETER PLACEMENT WITH ULTRASOUND AND FLUOROSCOPIC GUIDANCE  FLUOROSCOPY TIME:  1 minute and 14 seconds.  PROCEDURE: The procedure, risks, benefits, and alternatives were explained to the patient. Questions regarding the procedure were encouraged and answered. The patient understands and consents to the procedure.  Ultrasound was performed of both right and left neck. The left neck was prepped with chlorhexidine in a sterile fashion, and a sterile drape was applied covering the operative field. Maximum barrier sterile technique with sterile gowns and gloves were used for the procedure. A time-out was performed prior to the procedure. Local anesthesia was provided with 1% lidocaine.  After creating a small venotomy incision, a 21 gauge needle was advanced into the left external jugular vein under direct, real-time ultrasound guidance. Ultrasound image documentation was performed. A guidewire was advanced into the vein.  A 4 French micropuncture dilator was advanced over the  guidewire.  Final catheter positioning was confirmed and documented with a fluoroscopic spot image. Venous access was aspirated and flushed with sterile saline. A dressing was applied over the catheter exit site.  COMPLICATIONS: None.  No pneumothorax.  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 A small left external jugular vein was identifiable by ultrasound. No external jugular vein was visualized on the right.  After establishing access into the left external jugular vein, a guidewire would only advanced to the level of the subclavian vein. There was good return of blood and the catheter flushed well. The catheter tip lies in the left subclavian vein on completion.  IMPRESSION: Placement of non-tunneled central venous catheter via the left external jugular vein. The catheter tip lies in the left subclavian vein. The catheter is ready for immediate use.   Electronically Signed   By: Aletta Edouard M.D.   On: 08/16/2014 17:10   Ir US Guide Vasc Access Left  08/16/2014   CLINICAL DATA:  Poor intravenous access and history of end-stage renal disease. The patient requires IV access for antibiotics and other medications.  EXAM: NON-TUNNELED CENTRAL VENOUS CATHETER PLACEMENT WITH ULTRASOUND AND FLUOROSCOPIC GUIDANCE  FLUOROSCOPY TIME:  1 minute and 14 seconds.  PROCEDURE: The procedure, risks, benefits, and alternatives were explained to the patient. Questions regarding the procedure were encouraged and answered. The patient understands and consents to the procedure.  Ultrasound was performed of both right and left neck. The left neck was prepped with chlorhexidine in a sterile fashion, and a sterile drape was applied covering the operative field. Maximum barrier sterile technique with sterile gowns and gloves were used for the procedure. A time-out was performed prior to the procedure. Local anesthesia was provided with 1% lidocaine.  After creating a small venotomy  incision, a 21 gauge needle was advanced into the left external jugular vein under direct, real-time ultrasound guidance. Ultrasound image documentation was performed. A guidewire was advanced into the vein.  A 4 French micropuncture dilator was advanced over the  guidewire.  Final catheter positioning was confirmed and documented with a fluoroscopic spot image. Venous access was aspirated and flushed with sterile saline. A dressing was applied over the catheter exit site.  COMPLICATIONS: None.  No pneumothorax.  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 A small left external jugular vein was identifiable by ultrasound. No external jugular vein was visualized on the right.  After establishing access into the left external jugular vein, a guidewire would only advanced to the level of the subclavian vein. There was good return of blood and the catheter flushed well. The catheter tip lies in the left subclavian vein on completion.  IMPRESSION: Placement of non-tunneled central venous catheter via the left external jugular vein. The catheter tip lies in the left subclavian vein. The catheter is ready for immediate use.   Electronically Signed   By: Aletta Edouard M.D.   On: 08/16/2014 17:10   US Abdomen Limited Ruq  08/16/2014   CLINICAL DATA:  Cholecystitis  EXAM: US ABDOMEN LIMITED - RIGHT UPPER QUADRANT  COMPARISON:  None.  FINDINGS: Gallbladder:  Cholelithiasis. There is circumferential wall thickening to 4 mm without focal tenderness or pericholecystic edema.  Common bile duct:  Diameter: 5-6 mm  Liver:  No focal lesion identified. Within normal limits in parenchymal echogenicity. Antegrade flow in the hepatic and portal venous system.  Atrophic and echogenic right kidney consistent with patient's history of end-stage renal disease. Known right pleural effusion.  IMPRESSION: Cholelithiasis and gallbladder wall thickening. No gallbladder distention or focal  tenderness to suggest obstruction/acute cholecystitis.   Electronically Signed   By: Monte Fantasia M.D.   On: 08/16/2014 15:48   US Thoracentesis Asp Pleural Space W/img Guide  08/16/2014   INDICATION: Symptomatic right sided pleural effusion  EXAM: US THORACENTESIS ASP PLEURAL SPACE W/IMG GUIDE  COMPARISON:  Chest CT 08/16/14.  MEDICATIONS: None  COMPLICATIONS: None immediate  TECHNIQUE: Informed written consent was obtained from the patient after a discussion of the risks, benefits and alternatives to treatment. A timeout was performed prior to the initiation of the procedure.  Initial ultrasound scanning demonstrates a right pleural effusion. The lower chest was prepped and draped in the usual sterile fashion. 1% lidocaine was used for local anesthesia.  An ultrasound image was saved for documentation purposes. A 6 Fr Safe-T-Centesis catheter was introduced. The thoracentesis was performed. The catheter was removed and a dressing was applied. The patient tolerated the procedure well without immediate post procedural complication. The patient was escorted to have an upright chest radiograph. Procedure was stopped early secondary to pain with remaining pleural fluid seen.  FINDINGS: A total of approximately 600 ml of yellow fluid was removed. Requested samples were sent to the laboratory.  IMPRESSION: Successful ultrasound-guided right sided thoracentesis yielding 600 ml of pleural fluid.  Read By:  Tsosie Billing PA-C   Electronically Signed   By: Aletta Edouard M.D.   On: 08/16/2014 11:05    CBC  Recent Labs Lab 08/16/14 0112  08/16/14 1730 08/17/14 0555 08/18/14 0429 08/18/14 0910 08/19/14 0426  WBC 11.9*  --  9.9 11.3* 10.6* 11.2* 11.7*  HGB 12.7  < > 11.4* 12.1 11.6* 10.5* 10.6*  HCT 37.9  < > 34.0* 36.0 34.1* 31.3* 32.4*  PLT 324  --  321 326 285 301 347  MCV 91.8  --  90.9 92.5 91.2 91.0 94.7  MCH 30.8  --  30.5 31.1 31.0 30.5 31.0  MCHC 33.5  --  33.5  33.6 34.0 33.5 32.7  RDW 14.6   --  14.6 15.0 14.6 14.3 14.6  LYMPHSABS 1.4  --   --   --   --   --   --   MONOABS 0.8  --   --   --   --   --   --   EOSABS 0.6  --   --   --   --   --   --   BASOSABS 0.1  --   --   --   --   --   --   < > = values in this interval not displayed.  Chemistries   Recent Labs Lab 08/16/14 0112 08/16/14 0121 08/16/14 1730 08/17/14 0555 08/17/14 1040 08/18/14 0429 08/18/14 0910 08/19/14 0426  NA 129* 129* 130* 133*  --  128*  --  135  K 4.7 4.7 4.7 5.9* 5.0 4.6  --  4.3  CL 85* 91* 88* 94*  --  90*  --  95*  CO2 24  --  24 22  --  21  --  25  GLUCOSE 233* 239* 166* 205*  --  194*  --  117*  BUN 63* 58* 75* 36*  --  52*  --  23  CREATININE 12.41* 11.40* 13.51* 8.62*  --  10.86*  --  6.79*  CALCIUM 9.2  --  8.4 8.4  --  8.0*  --  8.8  MG  --   --   --   --   --   --  2.3  --   AST 22  --   --   --   --   --   --   --   ALT 20  --   --   --   --   --   --   --   ALKPHOS 90  --   --   --   --   --   --   --   BILITOT 0.6  --   --   --   --   --   --   --    ------------------------------------------------------------------------------------------------------------------ estimated creatinine clearance is 9.1 mL/min (by C-G formula based on Cr of 6.79). ------------------------------------------------------------------------------------------------------------------ No results for input(s): HGBA1C in the last 72 hours. ------------------------------------------------------------------------------------------------------------------ No results for input(s): CHOL, HDL, LDLCALC, TRIG, CHOLHDL, LDLDIRECT in the last 72 hours. ------------------------------------------------------------------------------------------------------------------ No results for input(s): TSH, T4TOTAL, T3FREE, THYROIDAB in the last 72 hours.  Invalid input(s): FREET3 ------------------------------------------------------------------------------------------------------------------ No results for input(s):  VITAMINB12, FOLATE, FERRITIN, TIBC, IRON, RETICCTPCT in the last 72 hours.  Coagulation profile  Recent Labs Lab 08/16/14 0112  INR 1.22    No results for input(s): DDIMER in the last 72 hours.  Cardiac Enzymes No results for input(s): CKMB, TROPONINI, MYOGLOBIN in the last 168 hours.  Invalid input(s): CK ------------------------------------------------------------------------------------------------------------------ Invalid input(s): [place]  08/19/14 0631 08/19/14 1031 08/19/14 1646 08/19/14 2048 08/19/14 2350 08/20/14 0351  GLUCAP 96 318* 238* 200* 145* 138Reyne Dumas M.D. [facility] 08/20/2014, 10:05 AM  Pager: [phone]   Between 7am to 7pm - call Pager - [phone]  After 7pm go to [email or web address] - password [email or web address]  Call night coverage person covering after 7pm

## 2014-08-20 NOTE — Progress Notes (Addendum)
ANTICOAGULATION CONSULT NOTE - Follow Up Consult  Pharmacy Consult for heparin Indication: DVT   Labs:  Recent Labs  08/18/14 0429 08/18/14 0910 08/19/14 0426 08/20/14 0546  HGB 11.6* 10.5* 10.6*  --   HCT 34.1* 31.3* 32.4*  --   PLT 285 301 347  --   HEPARINUNFRC  --   --   --  0.63  CREATININE 10.86*  --  6.79*  --     Assessment/Plan:  54 yo F presented to ED 4/24 with R sided CP, upper abd pain for the last few days.  Pt found to have pleural effusion cannot rule out pneumonia.  PMH: ESRD MWF, HTN, CVA, PVD, GERD, anemia, DM  Coag/Heme: found a new RIJ DVT 4/28, heparin started, vascular to do a venogram to determine if DVT is obstructive before deciding about oral anticoag. Heparin infusing at goal, confirmation level pending,  CBC low but stable.  ID:  loculated pleral effusion, cannot ro PNA, plan 1 week of abx after DC ; afeb, wbc up slightly, trend stable  Vanc 4/25 >> Zosyn 4/25 >>  4/25 pleural fluid cx, no growth  Renal:  ESRD, HD MWF, on way to HD now  Plan Spoke with HD RN to draw confirmatory heparin level Continue vancomcyin and zosyn, doses adjusted for renal function. Follow up SCr, UOP, cultures, clinical course and adjust as clinically indicated.  Thank you for allowing pharmacy to be a part of this patients care team.  Rowe Robert Pharm.D., BCPS, AQ-Cardiology Clinical Pharmacist 08/20/2014 1:48 PM Pager: [phone] Phone: [phone]  PM level therapeutic, no change in heparin Thank you. Anette Guarneri, PharmD

## 2014-08-20 NOTE — Consult Note (Signed)
[facility] [place] HISTORY AND PHYSICAL  Reason for consult: DVT left arm/ right IJ History of Present Illness:  Patient is a 54 y.o. year old female who presents for evaluation of right jugular and left brachial vein DVT.  Pt noted pale to bluish color left hand a few days ago which has now resolved.  Pt had duplex that showed DVT right IJ and left brachial veins. 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  She currently has no swelling or pain in her left arm. Recent US in radiology showed occlusion of both internal jugular veins.  Other medical problems include ESRD, prior CVA (on plavix/aspirin), PAD (popliteal stent Br[place]ham, TMA Dickson), anemia, diabetes which are currently stable.  Pt was admitted with abdominal pain and a pleural effusion which is now resolved.  Past Medical History  Diagnosis Date  . ESRD (end stage renal disease)   . Hypertension   . CVA (cerebral infarction)     right parietal 05/19/2000  . Vaginal bleeding   . Hyperparathyroidism   . Pneumonia   . Stroke     2002,no residual  . Peripheral vascular disease   . GERD (gastroesophageal reflux disease)   . Anemia   . DM (diabetes mellitus) type II controlled peripheral vascular disorder   . Slip/trip w/o falling due to stepping on object, subs July  2014    Past Surgical History  Procedure Laterality Date  . Parathyroidectomy with autotransplant  12/07/2010  . Knee surgery      right, x2  . Brachial artery graft      x2 for dialysis  . Av fistula placement      upper rt thigh  . Dental extractions Bilateral   . Dilation and curettage of uterus      hx: of 1986  . Transmetatarsal amputation Left 07/15/2012    Procedure: TRANSMETATARSAL AMPUTATION;  Surgeon: Angelia Mould, MD;  Location: [facility] OR;  Service: Vascular;  Laterality: Left;  . Leg  surgery Right   . Foot surgery Left     5 TOES AMPUTATED  . Abdominal aortagram N/A 05/27/2012    Procedure: ABDOMINAL Maxcine Ham;  Surgeon: Serafina Mitchell, MD;  Location: [facility] CATH LAB;  Service: Cardiovascular;  Laterality: N/A;  . Lower extremity angiogram Left 05/27/2012    Procedure: LOWER EXTREMITY ANGIOGRAM;  Surgeon: Serafina Mitchell, MD;  Location: [facility] CATH LAB;  Service: Cardiovascular;  Laterality: Left;  lt leg angio    Social History History  Substance Use Topics  . Smoking status: Never Smoker   . Smokeless tobacco: Never Used  . Alcohol Use: Yes     Comment: occasional    Family History Family History  Problem Relation Age of Onset  . Hypertension Father   . Kidney disease Mother     Allergies  Allergies  Allergen Reactions  . Doxycycline Itching  . Peroxyl [Hydrogen Peroxide] Hives and Rash     Current Facility-Administered Medications  Medication Dose Route Frequency Provider Last Rate Last Dose  . acetaminophen (TYLENOL) tablet 325-650 mg  325-650 mg Oral Q48H PRN Ripudeep K Rai, MD      . amLODipine (NORVASC) tablet 10 mg  10 mg Oral QHS Etta Quill, DO   10 mg at 08/19/14 2050  . clopidogrel (PLAVIX) tablet 75 mg  75 mg Oral Daily Toy Care  Gardner, DO   75 mg at 08/20/14 1030  . diphenhydrAMINE (BENADRYL) capsule 25 mg  25 mg Oral Q6H PRN Etta Quill, DO      . heparin ADULT infusion 100 units/mL (25000 units/250 mL)  1,150 Units/hr Intravenous Continuous Priscella Mann, RPH 11.5 mL/hr at 08/19/14 2051 1,150 Units/hr at 08/19/14 2051  . insulin aspart (novoLOG) injection 0-15 Units  0-15 Units Subcutaneous TID WC Gardiner Barefoot, NP   5 Units at 08/19/14 2108  . insulin aspart (novoLOG) injection 0-5 Units  0-5 Units Subcutaneous QHS Gardiner Barefoot, NP   5 Units at 08/19/14 2200  . multivitamin (RENA-VIT) tablet 1 tablet  1 tablet Oral Daily Etta Quill, DO   1 tablet at 08/20/14 1030  . neomycin-bacitracin-polymyxin (NEOSPORIN) ointment  1 application  1 application Topical Daily Etta Quill, DO   1 application at 44/31/54 1001  . piperacillin-tazobactam (ZOSYN) IVPB 2.25 g  2.25 g Intravenous 3 times per day Joelene Millin B Hammons, RPH   2.25 g at 08/20/14 1030  . polyethylene glycol (MIRALAX / GLYCOLAX) packet 17 g  17 g Oral Daily Reyne Dumas, MD   17 g at 08/19/14 1017  . sodium chloride 0.9 % bolus 500 mL  500 mL Intravenous Once [name], MD   500 mL at 08/16/14 0406  . traMADol (ULTRAM) tablet 50 mg  50 mg Oral BID PRN Etta Quill, DO   50 mg at 08/18/14 2345  . vancomycin (VANCOCIN) IVPB 750 mg/150 ml premix  750 mg Intravenous Q M,W,F-HD Kimberly B Hammons, RPH   750 mg at 08/18/14 1140    ROS:   General:  No weight loss, Fever, chills  HEENT: No recent headaches, no nasal bleeding, no visual changes, no sore throat  Neurologic: No dizziness, blackouts, seizures. No recent symptoms of stroke or mini- stroke. No recent episodes of slurred speech, or temporary blindness.  Cardiac: No recent episodes of chest pain/pressure, no shortness of breath at rest.  + shortness of breath with exertion.  Denies history of atrial fibrillation or irregular heartbeat  Vascular: No history of rest pain in feet.  No history of claudication.  + history of non-healing ulcer, No history of DVT   Pulmonary: No home oxygen, no productive cough, no hemoptysis,  No asthma or wheezing  Musculoskeletal:  [ ]  Arthritis, [ ]  Low back pain,  [ ]  Joint pain  Hematologic:No history of hypercoagulable state.  No history of easy bleeding.  + history of anemia  Gastrointestinal: No hematochezia or melena,  No gastroesophageal reflux, no trouble swallowing  Urinary: [ x] chronic Kidney disease, [x ] on HD - [ ]  MWF or [ ]  TTHS, [ ]  Burning with urination, [ ]  Frequent urination, [ ]  Difficulty urinating;   Skin: No rashes  Psychological: No history of anxiety,  No history of depression   Physical Examination  Filed Vitals:    08/19/14 1016 08/19/14 1319 08/19/14 1947 08/20/14 0353  BP: 117/59 134/57 130/60 99/54  Pulse:  97 98 101  Temp:  98.5 F (36.9 C) 98.7 F (37.1 C) 99.4 F (37.4 C)  TempSrc:  Oral Oral Oral  Resp:  18 18 18   Height:      Weight:    159 lb 6.3 oz (72.3 kg)  SpO2:  97% 98% 98%    Body mass index is 29.15 kg/(m^2).  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   PLAN:  Would obtain left central venogram in IR.  Most likely this can be done through her existing cathter.  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 would not start coumadin as she has no swelling symptoms in left arm and would not be at risk for PE.  Also would be good to avoid anticoagulation in addition to Plavix and ASA if possible.  Ruta Hinds, MD [facility] [place] Office: [phone] Pager: [phone]

## 2014-08-20 NOTE — Progress Notes (Signed)
ANTICOAGULATION CONSULT NOTE - Follow Up Consult  Pharmacy Consult for heparin Indication: DVT   Labs:  Recent Labs  08/18/14 0429 08/18/14 0910 08/19/14 0426 08/20/14 0546  HGB 11.6* 10.5* 10.6*  --   HCT 34.1* 31.3* 32.4*  --   PLT 285 301 347  --   HEPARINUNFRC  --   --   --  0.63  CREATININE 10.86*  --  6.79*  --     Assessment/Plan:  54yo female therapeutic on heparin with initial dosing for jugular/brachial DVT. Will continue gtt at current rate and confirm stable with additional level.   Wynona Neat, PharmD, BCPS  08/20/2014,7:38 AM

## 2014-08-21 ENCOUNTER — Inpatient Hospital Stay (HOSPITAL_COMMUNITY): Payer: Medicare Other

## 2014-08-21 DIAGNOSIS — R1084 Generalized abdominal pain: Secondary | ICD-10-CM

## 2014-08-21 DIAGNOSIS — E1129 Type 2 diabetes mellitus with other diabetic kidney complication: Secondary | ICD-10-CM | POA: Diagnosis not present

## 2014-08-21 DIAGNOSIS — Z992 Dependence on renal dialysis: Secondary | ICD-10-CM | POA: Diagnosis not present

## 2014-08-21 DIAGNOSIS — N186 End stage renal disease: Secondary | ICD-10-CM | POA: Diagnosis not present

## 2014-08-21 LAB — CBC
HEMATOCRIT: 33.9 % — AB (ref 36.0–46.0)
Hemoglobin: 11.1 g/dL — ABNORMAL LOW (ref 12.0–15.0)
MCH: 30.5 pg (ref 26.0–34.0)
MCHC: 32.7 g/dL (ref 30.0–36.0)
MCV: 93.1 fL (ref 78.0–100.0)
Platelets: 467 10*3/uL — ABNORMAL HIGH (ref 150–400)
RBC: 3.64 MIL/uL — ABNORMAL LOW (ref 3.87–5.11)
RDW: 14.5 % (ref 11.5–15.5)
WBC: 10.6 10*3/uL — ABNORMAL HIGH (ref 4.0–10.5)

## 2014-08-21 LAB — GLUCOSE, CAPILLARY
GLUCOSE-CAPILLARY: 95 mg/dL (ref 70–99)
Glucose-Capillary: 230 mg/dL — ABNORMAL HIGH (ref 70–99)
Glucose-Capillary: 161 mg/dL — ABNORMAL HIGH (ref 70–99)
Glucose-Capillary: 129 mg/dL — ABNORMAL HIGH (ref 70–99)

## 2014-08-21 LAB — HEPARIN LEVEL (UNFRACTIONATED): Heparin Unfractionated: 0.63 IU/mL (ref 0.30–0.70)

## 2014-08-21 MED ORDER — IOHEXOL 300 MG/ML  SOLN
100.0000 mL | Freq: Once | INTRAMUSCULAR | Status: AC | PRN
  Administered 2014-08-21: 20 mL via INTRAVENOUS

## 2014-08-21 MED ORDER — LIDOCAINE HCL 1 % IJ SOLN
INTRAMUSCULAR | Status: AC
  Filled 2014-08-21: qty 20

## 2014-08-21 NOTE — Progress Notes (Signed)
ANTICOAGULATION CONSULT NOTE - Follow Up Consult  Pharmacy Consult for heparin Indication: DVT   Labs:  Recent Labs  08/19/14 0426 08/20/14 0546 08/20/14 0805 08/20/14 1300 08/21/14 0430  HGB 10.6*  --  10.1*  --  11.1*  HCT 32.4*  --  30.6*  --  33.9*  PLT 347  --  371  --  467*  HEPARINUNFRC  --  0.63  --  0.51 0.63  CREATININE 6.79*  --  10.45*  --   --     Assessment/Plan:  54 yo F presented to ED 4/24 with R sided CP, upper abd pain for the last few days.  Pt found to have pleural effusion cannot rule out pneumonia.  PMH: ESRD MWF, HTN, CVA, PVD, GERD, anemia, DM  Coag/Heme: found a new RIJ DVT 4/28, heparin started, vascular to do a venogram to determine if DVT is obstructive before deciding about oral anticoag. Heparin infusing at goal,  CBC low but stable.  ID:  loculated pleral effusion, cannot ro PNA, plan 1 week of abx after DC ; afeb, wbc trend up slightly Vanc 4/25 >> Zosyn 4/25 >>  4/25 pleural fluid cx, no growth  Renal:  ESRD, HD MWF  Plan Continue current heparin ggt, pending decision about anticoagulation based on venogram Continue vancomcyin and zosyn, doses adjusted for renal function. Follow up SCr, UOP, cultures, clinical course and adjust as clinically indicated.  Thank you for allowing pharmacy to be a part of this patients care team.  Rowe Robert Pharm.D., BCPS, AQ-Cardiology Clinical Pharmacist 08/21/2014 12:52 PM Pager: [phone] Phone: [phone]  PM level therapeutic, no change in heparin Thank you. Anette Guarneri, PharmD

## 2014-08-21 NOTE — Discharge Summary (Signed)
Physician Discharge Summary  [name] GEZ:[id number] DOB: 1960-06-20 DOA: 08/15/2014  PCP: Chari Manning, NP  Admit date: 08/15/2014 Discharge date: 08/21/2014  Time spent: greater than 30 minutes   Discharge Diagnoses:  Principal Problem:   Abdominal pain Active Problems:   End stage renal disease on dialysis   DM (diabetes mellitus) type II controlled peripheral vascular disorder   Pleural effusion, right   Chest pain   Cholecystitis   Hypoxia   Discharge Condition: stable  Diet recommendation: renal diabetic  Filed Weights   08/20/14 0353 08/20/14 1408 08/20/14 1816  Weight: 72.3 kg (159 lb 6.3 oz) 70.2 kg (154 lb 12.2 oz) 68 kg (149 lb 14.6 oz)    History of present illness:  [name] is a 54 y.o. female with h/o ESRD, dialysis MWF, presents to the ED with c/o right sided chest pain, upper abdominal pain, which she describes as "gas". Symptoms have been ongoing for the past couple of days. She took rolaids earlier today without relief. She has also been SOB since earlier today.  She also c/o back mid right sided back pain which radiates around to the front. She also c/o SOB/dyspnea. Symptoms have been ongoing for the past couple of days. Pain is precipitated by movement and feels like something is catching. She tried rolaids without relief. She was found to have a large right sided pleural effusion by CT chest, IR completed pleuracentesis and her symptoms have since improved. She denies RUQ pain 0/10 pain, but still c/o right mid back pain.   HOSPITAL COURSE:   Right upper Abdominal pain - CT of the chest showed mild wall thickening and soft tissue inflammation about the gallbladder, raising concern for mild acute cholecystitis, underlying the cholelithiasis. Large loculated right-sided pleural effusion with consolidation of the right lung - Surgery consult called, right upper quadrant ultrasound did not show acute cholecystitis, surgery signed off  -  Lipase normal   Loculated Right pleural effusion with consolidation - Thoracentesis done, cultures so far negative, continue IV antibiotics for now data indicative of an exudative pleural effusion by Light's criteria. Cultures remain negative and CT scan of the chest without associated pneumonia/mass. empiric vancomycin and fortaz for 1 week at HD  Bilateral arm pain and leg pain Venous Doppler was ordered to rule out DVT in all 4 extremities based on patient's concerns Patient noted to have DVT in the right jugular and left brachial veins. Started patient on heparin gtt given ESRD and discussed with vascular again in the morning Dr. fields Dr. fields recommended left central venogram in IR.  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 There are segments of the left subclavian vein and brachiocephalic vein that fill, but there appears to be be occlusion of the left brachiocephalic vein at the confluence with the SVC.   Consultations:  Nephrology  General  surgery  Vascular surgery  IR  Discharge Exam: Filed Vitals:   08/21/14 1618  BP: 155/74  Pulse: 100  Temp: 98 F (36.7 C)  Resp: 18    General: comfortable Cardiovascular: RRR Respiratory:  CTA  Discharge Instructions   Discharge Instructions    Diet - low sodium heart healthy    Complete by:  As directed      Increase activity slowly    Complete by:  As directed           Current Discharge Medication List    START taking these medications   Details  cefTAZidime (FORTAZ) 1 G injection Inject 1 g into the muscle every Monday, Wednesday, and Friday. Qty: 1 vial, Refills: 0    polyethylene glycol (MIRALAX / GLYCOLAX) packet Take 17 g by mouth daily. Qty: 14 each, Refills: 0    Vancomycin (VANCOCIN) 750 MG/150ML SOLN Inject 150 mLs (750 mg total) into the vein every Monday, Wednesday, and Friday with hemodialysis. Qty: 150 mL, Refills: 0      CONTINUE these medications which have CHANGED   Details  amLODipine (NORVASC) 10 MG tablet Take 1 tablet (10 mg total) by mouth at bedtime. Qty: 30 tablet, Refills: 2      CONTINUE these medications which have NOT CHANGED   Details  Acetaminophen (TYLENOL PO) Take 325-650 mg by mouth See admin instructions. Uses at dialysis Mon, Wed, Fridays each week for muscle spasms    aspirin 81 MG tablet Take 1 tablet (81 mg total) by mouth daily. Qty: 90 tablet, Refills: 3   Associated Diagnoses: DM (diabetes mellitus) type II controlled peripheral vascular disorder    CALCIUM-VITAMIN D PO Take 1 tablet by mouth 3 (three) times daily with meals.    Clopidogrel Bisulfate (PLAVIX PO) Take 75 mg by mouth daily.     Cyanocobalamin (VITAMIN B-12 PO) Take 1 tablet by mouth 4 (four) times a week. Non dialysis days. Tues, Thurs, Sat and Sunday    diphenhydrAMINE (BENADRYL) 25 MG tablet Take 25 mg by mouth every 6 (six) hours as needed (Muscle spasm in feet).    glipiZIDE (GLUCOTROL) 10 MG tablet Take 0.5 tablets (5 mg total) by mouth daily. Qty: 45 tablet, Refills: 3   Associated Diagnoses: DM (diabetes mellitus) type II controlled peripheral vascular disorder    multivitamin (RENA-VIT) TABS tablet Take 1 tablet by mouth daily.     neomycin-bacitracin-polymyxin (NEOSPORIN) [id number] ointment Apply 1 application topically daily. Apply to foot    traMADol (ULTRAM) 50 MG tablet Take 50 mg by mouth 2 (two) times daily as needed for moderate pain (for foot pain). For foot pain Refills: 0       Allergies  Allergen Reactions  . Doxycycline Itching  . Peroxyl [Hydrogen Peroxide] Hives and Rash   Follow-up Information    Call Maia Petties., MD.   Specialty:  General Surgery   Why:  As needed for your gallbladder   Contact information:   [address] [place] [address] [phone]       Schedule an appointment as soon as possible for a visit with Chari Manning, NP.   Specialty:  Internal Medicine   Contact information:   [place] [address] [phone]        The results of significant diagnostics from this hospitalization (including imaging, microbiology, ancillary and laboratory) are listed below for reference.    Significant Diagnostic Studies: Dg Chest 1 View  08/16/2014   CLINICAL  DATA:  Thoracentesis.  EXAM: CHEST  1 VIEW  COMPARISON:  08/16/2014  FINDINGS: Large right pleural effusion is modestly decreased from previous. The underlying lung is opacified.  Unchanged cardiomegaly and vascular pedicle widening. No pulmonary edema no in the left lung. No overt re-expansion edema in the right lung.  IMPRESSION: Decreased but still large right pleural effusion.  No pneumothorax.   Electronically Signed   By: Monte Fantasia M.D.   On: 08/16/2014 10:49   Dg Chest 2 View  08/16/2014   CLINICAL DATA:  54 year old female with several day history of chest and back pain accompanied by shortness of breath  EXAM: CHEST  2 VIEW  COMPARISON:  Prior chest x-ray 02/25/2014  FINDINGS: Large right layering pleural effusion with associated dense opacification of the right mid and lower chest. Mild pulmonary vascular congestion without edema. Cardiac and mediastinal contours are grossly  within normal limits. No focal airspace consolidation in the left lung. No acute osseous abnormality.  IMPRESSION: 1. Large right layering pleural effusion with associated dense right mid and basilar airspace opacity. Findings are favored to reflect pleural fluid with atelectasis. Superimposed infiltrate/ pneumonia and mass are difficult to exclude radiographically. 2. Consider further evaluation with CT scan of the chest with contrast.   Electronically Signed   By: Jacqulynn Cadet M.D.   On: 08/16/2014 02:24   Ct Chest W Contrast  08/16/2014   CLINICAL DATA:  Acute onset of right-sided chest pain and upper abdominal pain. Shortness of breath. Pleural effusion noted on chest radiograph. Initial encounter.  EXAM: CT CHEST WITH CONTRAST  TECHNIQUE: Multidetector CT imaging of the chest was performed during intravenous contrast administration.  CONTRAST:  72mL OMNIPAQUE IOHEXOL 300 MG/ML  SOLN  COMPARISON:  Chest radiograph performed earlier today at 12:40 a.m., and CTA of the chest performed 05/07/2009  FINDINGS: There is a large loculated right-sided pleural effusion, with consolidation of much of the right lung. Minimal left basilar atelectasis is noted. No definite mass is seen. There is slightly unusual loculation of fluid along the right side of the mediastinum, without definite evidence of a pleural rind. No pneumothorax is seen.  The mediastinum is unremarkable in appearance. No mediastinal lymphadenopathy is seen. No pericardial effusion is identified. The great vessels are grossly unremarkable in appearance. The thyroid gland is unremarkable. No axillary lymphadenopathy is seen.  The visualized portions of the liver and spleen are unremarkable in appearance. There is mild wall thickening and soft tissue inflammation about the gallbladder, raising concern for mild acute cholecystitis. A few stones are seen within the gallbladder. The visualized portions of the pancreas and adrenal glands are unremarkable.  Bilateral renal atrophy is noted.  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  No acute osseous abnormalities are identified.  IMPRESSION: 1. Mild wall thickening and soft tissue inflammation about the gallbladder, raising concern for mild acute cholecystitis. Would correlate for associated symptoms. 2. Underlying cholelithiasis noted. 3. Large loculated right-sided pleural effusion, with consolidation of much of the right lung. Minimal left basilar atelectasis noted. No definite mass is seen. Diagnostic thoracentesis could be considered for further evaluation, as deemed clinically appropriate. 4. Bilateral renal atrophy noted. 5. Calcification along the superior mesenteric artery, with mural thrombus and luminal narrowing.   Electronically Signed   By: Garald Balding M.D.   On: 08/16/2014 04:58   Ir Fluoro Guide Cv Line Left  08/16/2014   CLINICAL DATA:  Poor intravenous access and history of  end-stage renal disease. The patient requires IV access for antibiotics and other medications.  EXAM: NON-TUNNELED CENTRAL VENOUS CATHETER PLACEMENT WITH ULTRASOUND AND FLUOROSCOPIC GUIDANCE  FLUOROSCOPY TIME:  1 minute and 14 seconds.  PROCEDURE: The procedure, risks, benefits, and alternatives were explained to the patient. Questions regarding the procedure were encouraged and answered. The patient understands and consents to the procedure.  Ultrasound was performed of both right and left neck. The left neck was prepped with chlorhexidine in a sterile fashion, and a sterile drape was applied covering the operative field. Maximum barrier sterile technique with sterile gowns and gloves were used for the procedure. A time-out was performed prior to the procedure. Local anesthesia was provided with 1% lidocaine.  After creating a small venotomy incision, a 21 gauge needle was advanced into the left external jugular vein under direct, real-time ultrasound guidance.  Ultrasound image documentation was performed. A guidewire was advanced into the vein.  A 4 French micropuncture dilator was advanced over the guidewire.  Final catheter positioning was confirmed and documented with a fluoroscopic spot image. Venous access was aspirated and flushed with sterile saline. A dressing was applied over the catheter exit site.  COMPLICATIONS: None.  No pneumothorax.  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 A small left external jugular vein was identifiable by ultrasound. No external jugular vein was visualized on the right.  After establishing access into the left external jugular vein, a guidewire would only advanced to the level of the subclavian vein. There was good return of blood and the catheter flushed well. The catheter tip lies in the left subclavian vein on completion.  IMPRESSION: Placement of non-tunneled central venous catheter via the left external jugular vein. The catheter tip lies in the left subclavian vein. The catheter is ready for immediate use.   Electronically Signed   By: Aletta Edouard M.D.   On: 08/16/2014 17:10   Ir US Guide Vasc Access Left  08/16/2014   CLINICAL DATA:  Poor intravenous access and history of end-stage renal disease. The patient requires IV access for antibiotics and other medications.  EXAM: NON-TUNNELED CENTRAL VENOUS CATHETER PLACEMENT WITH ULTRASOUND AND FLUOROSCOPIC GUIDANCE  FLUOROSCOPY TIME:  1 minute and 14 seconds.  PROCEDURE: The procedure, risks, benefits, and alternatives were explained to the patient. Questions regarding the procedure were encouraged and answered. The patient understands and consents to the procedure.  Ultrasound was performed of both right and left neck. The left neck was prepped with chlorhexidine in a sterile fashion, and a sterile drape was applied covering the operative field. Maximum barrier sterile technique with sterile gowns and gloves were used for the  procedure. A time-out was performed prior to the procedure. Local anesthesia was provided with 1% lidocaine.  After creating a small venotomy incision, a 21 gauge needle was advanced into the left external jugular vein under direct, real-time ultrasound guidance. Ultrasound image documentation was performed. A guidewire was advanced into the vein.  A 4 French micropuncture dilator was advanced over the guidewire.  Final catheter positioning was confirmed and documented with a fluoroscopic spot image. Venous access was aspirated and flushed with sterile saline. A dressing was applied over the catheter exit site.  COMPLICATIONS: None.  No pneumothorax.  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 A small left external jugular vein was identifiable by ultrasound. No external jugular vein was visualized on the right.  After establishing access into the left external jugular vein, a  guidewire would only advanced to the level of the subclavian vein. There was good return of blood and the catheter flushed well. The catheter tip lies in the left subclavian vein on completion.  IMPRESSION: Placement of non-tunneled central venous catheter via the left external jugular vein. The catheter tip lies in the left subclavian vein. The catheter is ready for immediate use.   Electronically Signed   By: Aletta Edouard M.D.   On: 08/16/2014 17:10   US Abdomen Limited Ruq  08/16/2014   CLINICAL DATA:  Cholecystitis  EXAM: US ABDOMEN LIMITED - RIGHT UPPER QUADRANT  COMPARISON:  None.  FINDINGS: Gallbladder:  Cholelithiasis. There is circumferential wall thickening to 4 mm without focal tenderness or pericholecystic edema.  Common bile duct:  Diameter: 5-6 mm  Liver:  No focal lesion identified. Within normal limits in parenchymal echogenicity. Antegrade flow in the hepatic and portal venous system.  Atrophic and echogenic right kidney consistent with patient's history of end-stage renal  disease. Known right pleural effusion.  IMPRESSION: Cholelithiasis and gallbladder wall thickening. No gallbladder distention or focal tenderness to suggest obstruction/acute cholecystitis.   Electronically Signed   By: Monte Fantasia M.D.   On: 08/16/2014 15:48   US Thoracentesis Asp Pleural Space W/img Guide  08/16/2014   INDICATION: Symptomatic right sided pleural effusion  EXAM: US THORACENTESIS ASP PLEURAL SPACE W/IMG GUIDE  COMPARISON:  Chest CT 08/16/14.  MEDICATIONS: None  COMPLICATIONS: None immediate  TECHNIQUE: Informed written consent was obtained from the patient after a discussion of the risks, benefits and alternatives to treatment. A timeout was performed prior to the initiation of the procedure.  Initial ultrasound scanning demonstrates a right pleural effusion. The lower chest was prepped and draped in the usual sterile fashion. 1% lidocaine was used for local anesthesia.  An ultrasound image was saved for documentation purposes. A 6 Fr Safe-T-Centesis catheter was introduced. The thoracentesis was performed. The catheter was removed and a dressing was applied. The patient tolerated the procedure well without immediate post procedural complication. The patient was escorted to have an upright chest radiograph. Procedure was stopped early secondary to pain with remaining pleural fluid seen.  FINDINGS: A total of approximately 600 ml of yellow fluid was removed. Requested samples were sent to the laboratory.  IMPRESSION: Successful ultrasound-guided right sided thoracentesis yielding 600 ml of pleural fluid.  Read By:  Tsosie Billing PA-C   Electronically Signed   By: Aletta Edouard M.D.   On: 08/16/2014 11:05    Microbiology: Recent Results (from the past 240 hour(s))  Body fluid culture     Status: None   Collection Time: 08/16/14 10:16 AM  Result Value Ref Range Status   Specimen Description FLUID PLEURAL RIGHT  Final   Special Requests NONE  Final   Gram Stain   Final    RARE WBC  PRESENT,BOTH PMN AND MONONUCLEAR NO ORGANISMS SEEN Performed at [facility]    Culture   Final    NO GROWTH 3 DAYS Performed at [facility]    Report Status 08/19/2014 FINAL  Final     Labs: Basic Metabolic Panel:  Recent Labs Lab 08/16/14 1730 08/17/14 0555 08/17/14 1040 08/18/14 0429 08/18/14 0910 08/19/14 0426 08/20/14 0805  NA 130* 133*  --  128*  --  135 133*  K 4.7 5.9* 5.0 4.6  --  4.3 3.9  CL 88* 94*  --  90*  --  95* 94*  CO2 24 22  --  21  --  25 24  GLUCOSE 166* 205*  --  194*  --  117* 289*  BUN 75* 36*  --  52*  --  23 43*  CREATININE 13.51* 8.62*  --  10.86*  --  6.79* 10.45*  CALCIUM 8.4 8.4  --  8.0*  --  8.8 8.4  MG  --   --   --   --  2.3  --   --   PHOS 6.4*  --   --   --   --   --  5.5*   Liver Function Tests:  Recent Labs Lab 08/16/14 0112 08/16/14 1730 08/20/14 0805  AST 22  --   --   ALT 20  --   --   ALKPHOS 90  --   --   BILITOT 0.6  --   --   PROT 8.8*  --   --   ALBUMIN 3.2* 2.7* 2.4*    Recent Labs Lab 08/16/14 0112  LIPASE 46   No results for input(s): AMMONIA in the last 168 hours. CBC:  Recent Labs Lab 08/16/14 0112  08/18/14 0429 08/18/14 0910 08/19/14 0426 08/20/14 0805 08/21/14 0430  WBC 11.9*  < > 10.6* 11.2* 11.7* 9.0 10.6*  NEUTROABS 9.1*  --   --   --   --   --   --   HGB 12.7  < > 11.6* 10.5* 10.6* 10.1* 11.1*  HCT 37.9  < > 34.1* 31.3* 32.4* 30.6* 33.9*  MCV 91.8  < > 91.2 91.0 94.7 91.3 93.1  PLT 324  < > 285 301 347 371 467*  < > = values in this interval not displayed. Cardiac Enzymes: No results for input(s): CKTOTAL, CKMB, CKMBINDEX, TROPONINI in the last 168 hours. BNP: BNP (last 3 results) No results for input(s): BNP in the last 8760 hours.  ProBNP (last 3 results) No results for input(s): PROBNP in the last 8760 hours.  CBG:  Recent Labs Lab 08/20/14 1130 08/20/14 2002 08/20/14 2102 08/21/14 0553 08/21/14 1128  GLUCAP 327* 109* 204* 230* 161*        Signed:  Nikeria Kalman L  [facility] 08/21/2014, 4:39 PM

## 2014-08-21 NOTE — Consult Note (Signed)
  Filed Vitals:   08/20/14 1800 08/20/14 1816 08/20/14 1959 08/21/14 0502  BP: 101/66 133/79 122/57 129/55  Pulse: 108 105 100 102  Temp:  97.9 F (36.6 C) 99.3 F (37.4 C) 98.5 F (36.9 C)  TempSrc:  Oral Oral Oral  Resp:  13 18 18   Height:      Weight:  149 lb 14.6 oz (68 kg)    SpO2:  100% 99% 95%    No real swelling in left hand, well perfused  Central venogram pending today confirmed with IR.  IF left subclavian, innominate or SVC is occluded no need for coumadin.  Call if questions  Ruta Hinds, MD [facility] Office: [phone] Pager: [phone]

## 2014-08-21 NOTE — Progress Notes (Signed)
[facility]                                                                              Patient Demographics  [name], is a 54 y.o. female, DOB - Mar 14, 1961, LKJ:[id number]  Admit date - 08/15/2014   Admitting Physician Etta Quill, DO  Outpatient Primary MD for the patient is Chari Manning, NP  LOS - 5   Chief Complaint  Patient presents with  . Abdominal Pain       HPI :[name] is a 54 y.o. female with h/o ESRD, dialysis MWF, presents to the ED with c/o right sided chest pain, upper abdominal pain, which she describes as "gas". Symptoms have been ongoing for the past couple of days. She took rolaids earlier today without relief. She has also been SOB since earlier today.  She also c/o back mid right sided back pain which radiates around to the front. She also c/o SOB/dyspnea. Symptoms have been ongoing for the past couple of days. Pain is precipitated by movement and feels like something is catching. She tried rolaids without relief. She was found to have a large right sided pleural effusion by CT chest, IR completed pleuracentesis and her symptoms have since improved. She denies RUQ pain 0/10 pain, but still c/o right mid back pain.   HOSPITAL COURSE:   Right upper Abdominal pain - CT of the chest showed mild wall thickening and soft tissue inflammation about the gallbladder, raising concern for mild acute cholecystitis, underlying the cholelithiasis. 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 Vascular surgery consulted. Awaiting venogram in IR.  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 no need for anticoagulation    End stage renal disease on dialysis - Nephrology following, HD today per her usual OP HD schedule Usually hypotensive after hemodialysis   DM (diabetes mellitus) type II controlled peripheral vascular disorder - Continue sliding-scale insulin while inpatient  Hypertension - Currently controlled, continue amlodipine  Code Status: Full code  Family Communication: Patient is alert and oriented  Disposition Plan: If venogram negative, discharged home  Time Spent in minutes   25 minutes  Procedures  Thoracentesis  Consults   Interventional radiology Surgery Vascular surgery  DVT Prophylaxis     Medications  Scheduled Meds: . amLODipine  10 mg Oral QHS  . clopidogrel  75 mg Oral Daily  . insulin aspart  0-15 Units Subcutaneous TID WC  . insulin aspart  0-5 Units Subcutaneous QHS  . multivitamin  1 tablet Oral Daily  . neomycin-bacitracin-polymyxin  1 application Topical Daily  . piperacillin-tazobactam (ZOSYN)  IV  2.25 g Intravenous 3 times per day  . polyethylene glycol  17 g Oral Daily  . sodium chloride  500 mL Intravenous Once  . vancomycin  750 mg Intravenous Q  M,W,F-HD   Continuous Infusions: . heparin 1,150 Units/hr (08/19/14 2051)   PRN Meds:.acetaminophen, diphenhydrAMINE, traMADol   Antibiotics   Anti-infectives    Start     Dose/Rate Route Frequency Ordered Stop   08/19/14 0000  Vancomycin (VANCOCIN) 750 MG/150ML SOLN     750 mg 150 mL/hr over 60 Minutes Intravenous Every M-W-F (Hemodialysis) 08/19/14 1228 08/26/14 2359   08/19/14 0000  cefTAZidime (FORTAZ) 1 G injection     1 g Intramuscular Every M-W-F 08/19/14 1228 08/26/14 2359   08/16/14 1600  vancomycin (VANCOCIN) IVPB 750 mg/150 ml premix     750 mg 150 mL/hr over 60 Minutes Intravenous Every M-W-F (Hemodialysis) 08/16/14 1134     08/16/14 1200  piperacillin-tazobactam (ZOSYN) IVPB 2.25 g     2.25 g 100 mL/hr over  30 Minutes Intravenous 3 times per day 08/16/14 1134     08/16/14 1145  vancomycin (VANCOCIN) 1,500 mg in sodium chloride 0.9 % 500 mL IVPB     1,500 mg 250 mL/hr over 120 Minutes Intravenous NOW 08/16/14 1134 08/16/14 1520        Subjective:   Sleepy. No other complaints.  Objective:   Blood pressure 129/55, pulse 102, temperature 98.5 F (36.9 C), temperature source Oral, resp. rate 18, height 5\' 2"  (1.575 m), weight 68 kg (149 lb 14.6 oz), SpO2 95 %.  Wt Readings from Last 3 Encounters:  08/20/14 68 kg (149 lb 14.6 oz)  12/10/13 74.844 kg (165 lb)  10/20/13 73.936 kg (163 lb)     Intake/Output Summary (Last 24 hours) at 08/21/14 1431 Last data filed at 08/20/14 1816  Gross per 24 hour  Intake      0 ml  Output   2000 ml  Net  -2000 ml    Exam  General: Asleep. Arousable. Appropriate.  HEENT:  PERRLA, EOMI, Anicteic Sclera, mucous membranes moist.   Neck: Supple, no JVD, no masses  CVS: S1 S2 clear, RRR without murmurs gallops rubs  Respiratory: Decreased breath sounds on the right, CTA on the left without wheezes rhonchi or rales  Abdomen: Soft, NT, ND, + bowel sounds  Ext: no cyanosis clubbing or edema  Skin: No rashes   Data Review   Micro Results Recent Results (from the past 240 hour(s))  Body fluid culture     Status: None   Collection Time: 08/16/14 10:16 AM  Result Value Ref Range Status   Specimen Description FLUID PLEURAL RIGHT  Final   Special Requests NONE  Final   Gram Stain   Final    RARE WBC PRESENT,BOTH PMN AND MONONUCLEAR NO ORGANISMS SEEN Performed at [facility]    Culture   Final    NO GROWTH 3 DAYS Performed at [facility]    Report Status 08/19/2014 FINAL  Final    Radiology Reports Dg Chest 1 View  08/16/2014   CLINICAL DATA:  Thoracentesis.  EXAM: CHEST  1 VIEW  COMPARISON:  08/16/2014  FINDINGS: Large right pleural effusion is modestly decreased from previous. The underlying lung is opacified.   Unchanged cardiomegaly and vascular pedicle widening. No pulmonary edema no in the left lung. No overt re-expansion edema in the right lung.  IMPRESSION: Decreased but still large right pleural effusion.  No pneumothorax.   Electronically Signed   By: Monte Fantasia M.D.   On: 08/16/2014 10:49   Dg Chest 2 View  08/16/2014   CLINICAL DATA:  54 year old female with several day history of chest and back  pain accompanied by shortness of breath  EXAM: CHEST  2 VIEW  COMPARISON:  Prior chest x-ray 02/25/2014  FINDINGS: Large right layering pleural effusion with associated dense opacification of the right mid and lower chest. Mild pulmonary vascular congestion without edema. Cardiac and mediastinal contours are grossly within normal limits. No focal airspace consolidation in the left lung. No acute osseous abnormality.  IMPRESSION: 1. Large right layering pleural effusion with associated dense right mid and basilar airspace opacity. Findings are favored to reflect pleural fluid with atelectasis. Superimposed infiltrate/ pneumonia and mass are difficult to exclude radiographically. 2. Consider further evaluation with CT scan of the chest with contrast.   Electronically Signed   By: Jacqulynn Cadet M.D.   On: 08/16/2014 02:24   Ct Chest W Contrast  08/16/2014   CLINICAL DATA:  Acute onset of right-sided chest pain and upper abdominal pain. Shortness of breath. Pleural effusion noted on chest radiograph. Initial encounter.  EXAM: CT CHEST WITH CONTRAST  TECHNIQUE: Multidetector CT imaging of the chest was performed during intravenous contrast administration.  CONTRAST:  48mL OMNIPAQUE IOHEXOL 300 MG/ML  SOLN  COMPARISON:  Chest radiograph performed earlier today at 12:40 a.m., and CTA of the chest performed 05/07/2009  FINDINGS: There is a large loculated right-sided pleural effusion, with consolidation of much of the right lung. Minimal left basilar atelectasis is noted. No definite mass is seen. There is slightly  unusual loculation of fluid along the right side of the mediastinum, without definite evidence of a pleural rind. No pneumothorax is seen.  The mediastinum is unremarkable in appearance. No mediastinal lymphadenopathy is seen. No pericardial effusion is identified. The great vessels are grossly unremarkable in appearance. The thyroid gland is unremarkable. No axillary lymphadenopathy is seen.  The visualized portions of the liver and spleen are unremarkable in appearance. There is mild wall thickening and soft tissue inflammation about the gallbladder, raising concern for mild acute cholecystitis. A few stones are seen within the gallbladder. The visualized portions of the pancreas and adrenal glands are unremarkable. Bilateral renal atrophy is noted.  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  No acute osseous abnormalities are identified.  IMPRESSION: 1. Mild wall thickening and soft tissue inflammation about the gallbladder, raising concern for mild acute cholecystitis. Would correlate for associated symptoms. 2. Underlying cholelithiasis noted. 3. Large loculated right-sided pleural effusion, with consolidation of much of the right lung. Minimal left basilar atelectasis noted. No definite mass is seen. Diagnostic thoracentesis could be considered for further evaluation, as deemed clinically appropriate. 4. Bilateral renal atrophy noted. 5. Calcification along the superior mesenteric artery, with mural thrombus and luminal narrowing.   Electronically Signed   By: Garald Balding M.D.   On: 08/16/2014 04:58   Ir Fluoro Guide Cv Line Left  08/16/2014   CLINICAL DATA:  Poor intravenous access and history of end-stage renal disease. The patient requires IV access for antibiotics and other medications.  EXAM: NON-TUNNELED CENTRAL VENOUS CATHETER PLACEMENT WITH ULTRASOUND AND FLUOROSCOPIC GUIDANCE  FLUOROSCOPY TIME:  1 minute and 14 seconds.  PROCEDURE:  The procedure, risks, benefits, and alternatives were explained to the patient. Questions regarding the procedure were encouraged and answered. The patient understands and consents to the procedure.  Ultrasound was performed of both right and left neck. The left neck was prepped with chlorhexidine in a sterile fashion, and a sterile drape was applied covering the operative field. Maximum barrier sterile technique with sterile gowns and gloves  were used for the procedure. A time-out was performed prior to the procedure. Local anesthesia was provided with 1% lidocaine.  After creating a small venotomy incision, a 21 gauge needle was advanced into the left external jugular vein under direct, real-time ultrasound guidance. Ultrasound image documentation was performed. A guidewire was advanced into the vein.  A 4 French micropuncture dilator was advanced over the guidewire.  Final catheter positioning was confirmed and documented with a fluoroscopic spot image. Venous access was aspirated and flushed with sterile saline. A dressing was applied over the catheter exit site.  COMPLICATIONS: None.  No pneumothorax.  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 A small left external jugular vein was identifiable by ultrasound. No external jugular vein was visualized on the right.  After establishing access into the left external jugular vein, a guidewire would only advanced to the level of the subclavian vein. There was good return of blood and the catheter flushed well. The catheter tip lies in the left subclavian vein on completion.  IMPRESSION: Placement of non-tunneled central venous catheter via the left external jugular vein. The catheter tip lies in the left subclavian vein. The catheter is ready for immediate use.   Electronically Signed   By: Aletta Edouard M.D.   On: 08/16/2014 17:10   Ir US Guide Vasc Access Left  08/16/2014   CLINICAL DATA:  Poor intravenous  access and history of end-stage renal disease. The patient requires IV access for antibiotics and other medications.  EXAM: NON-TUNNELED CENTRAL VENOUS CATHETER PLACEMENT WITH ULTRASOUND AND FLUOROSCOPIC GUIDANCE  FLUOROSCOPY TIME:  1 minute and 14 seconds.  PROCEDURE: The procedure, risks, benefits, and alternatives were explained to the patient. Questions regarding the procedure were encouraged and answered. The patient understands and consents to the procedure.  Ultrasound was performed of both right and left neck. The left neck was prepped with chlorhexidine in a sterile fashion, and a sterile drape was applied covering the operative field. Maximum barrier sterile technique with sterile gowns and gloves were used for the procedure. A time-out was performed prior to the procedure. Local anesthesia was provided with 1% lidocaine.  After creating a small venotomy incision, a 21 gauge needle was advanced into the left external jugular vein under direct, real-time ultrasound guidance. Ultrasound image documentation was performed. A guidewire was advanced into the vein.  A 4 French micropuncture dilator was advanced over the guidewire.  Final catheter positioning was confirmed and documented with a fluoroscopic spot image. Venous access was aspirated and flushed with sterile saline. A dressing was applied over the catheter exit site.  COMPLICATIONS: None.  No pneumothorax.  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 A small left external jugular vein was identifiable by ultrasound. No external jugular vein was visualized on the right.  After establishing access into the left external jugular vein, a guidewire would only advanced to the level of the subclavian vein. There was good return of blood and the catheter flushed well. The catheter tip lies in the left subclavian vein on completion.  IMPRESSION: Placement of non-tunneled central venous catheter via the left  external jugular vein. The catheter tip lies in the left subclavian vein. The catheter is ready for immediate use.   Electronically Signed   By: Aletta Edouard M.D.   On: 08/16/2014 17:10   US Abdomen Limited Ruq  08/16/2014   CLINICAL DATA:  Cholecystitis  EXAM: US ABDOMEN LIMITED - RIGHT UPPER QUADRANT  COMPARISON:  None.  FINDINGS: Gallbladder:  Cholelithiasis. There is circumferential wall thickening to 4 mm without focal tenderness or pericholecystic edema.  Common bile duct:  Diameter: 5-6 mm  Liver:  No focal lesion identified. Within normal limits in parenchymal echogenicity. Antegrade flow in the hepatic and portal venous system.  Atrophic and echogenic right kidney consistent with patient's history of end-stage renal disease. Known right pleural effusion.  IMPRESSION: Cholelithiasis and gallbladder wall thickening. No gallbladder distention or focal tenderness to suggest obstruction/acute cholecystitis.   Electronically Signed   By: Monte Fantasia M.D.   On: 08/16/2014 15:48   US Thoracentesis Asp Pleural Space W/img Guide  08/16/2014   INDICATION: Symptomatic right sided pleural effusion  EXAM: US THORACENTESIS ASP PLEURAL SPACE W/IMG GUIDE  COMPARISON:  Chest CT 08/16/14.  MEDICATIONS: None  COMPLICATIONS: None immediate  TECHNIQUE: Informed written consent was obtained from the patient after a discussion of the risks, benefits and alternatives to treatment. A timeout was performed prior to the initiation of the procedure.  Initial ultrasound scanning demonstrates a right pleural effusion. The lower chest was prepped and draped in the usual sterile fashion. 1% lidocaine was used for local anesthesia.  An ultrasound image was saved for documentation purposes. A 6 Fr Safe-T-Centesis catheter was introduced. The thoracentesis was performed. The catheter was removed and a dressing was applied. The patient tolerated the procedure well without immediate post procedural complication. The patient was  escorted to have an upright chest radiograph. Procedure was stopped early secondary to pain with remaining pleural fluid seen.  FINDINGS: A total of approximately 600 ml of yellow fluid was removed. Requested samples were sent to the laboratory.  IMPRESSION: Successful ultrasound-guided right sided thoracentesis yielding 600 ml of pleural fluid.  Read By:  Tsosie Billing PA-C   Electronically Signed   By: Aletta Edouard M.D.   On: 08/16/2014 11:05    CBC  Recent Labs Lab 08/16/14 0112  08/18/14 0429 08/18/14 0910 08/19/14 0426 08/20/14 0805 08/21/14 0430  WBC 11.9*  < > 10.6* 11.2* 11.7* 9.0 10.6*  HGB 12.7  < > 11.6* 10.5* 10.6* 10.1* 11.1*  HCT 37.9  < > 34.1* 31.3* 32.4* 30.6* 33.9*  PLT 324  < > 285 301 347 371 467*  MCV 91.8  < > 91.2 91.0 94.7 91.3 93.1  MCH 30.8  < > 31.0 30.5 31.0 30.1 30.5  MCHC 33.5  < > 34.0 33.5 32.7 33.0 32.7  RDW 14.6  < > 14.6 14.3 14.6 14.3 14.5  LYMPHSABS 1.4  --   --   --   --   --   --   MONOABS 0.8  --   --   --   --   --   --   EOSABS 0.6  --   --   --   --   --   --   BASOSABS 0.1  --   --   --   --   --   --   < > = values in this interval not displayed.  Chemistries   Recent Labs Lab 08/16/14 0112  08/16/14 1730 08/17/14 0555 08/17/14 1040 08/18/14 0429 08/18/14 0910 08/19/14 0426 08/20/14 0805  NA 129*  < > 130* 133*  --  128*  --  135 133*  K 4.7  < > 4.7 5.9* 5.0 4.6  --  4.3 3.9  CL 85*  < > 88* 94*  --  90*  --  95* 94*  CO2 24  --  24 22  --  21  --  25 24  GLUCOSE 233*  < > 166* 205*  --  194*  --  117* 289*  BUN 63*  < > 75* 36*  --  52*  --  23 43*  CREATININE 12.41*  < > 13.51* 8.62*  --  10.86*  --  6.79* 10.45*  CALCIUM 9.2  --  8.4 8.4  --  8.0*  --  8.8 8.4  MG  --   --   --   --   --   --  2.3  --   --   AST 22  --   --   --   --   --   --   --   --   ALT 20  --   --   --   --   --   --   --   --   ALKPHOS 90  --   --   --   --   --   --   --   --   BILITOT 0.6  --   --   --   --   --   --   --   --   < > =  values in this interval not displayed. ------------------------------------------------------------------------------------------------------------------ estimated creatinine clearance is 5.8 mL/min (by C-G formula based on Cr of 10.45). ------------------------------------------------------------------------------------------------------------------ No results for input(s): HGBA1C in the last 72 hours. ------------------------------------------------------------------------------------------------------------------ No results for input(s): CHOL, HDL, LDLCALC, TRIG, CHOLHDL, LDLDIRECT in the last 72 hours. ------------------------------------------------------------------------------------------------------------------ No results for input(s): TSH, T4TOTAL, T3FREE, THYROIDAB in the last 72 hours.  Invalid input(s): FREET3 ------------------------------------------------------------------------------------------------------------------ No results for input(s): VITAMINB12, FOLATE, FERRITIN, TIBC, IRON, RETICCTPCT in the last 72 hours.  Coagulation profile  Recent Labs Lab 08/16/14 0112  INR 1.22    No results for input(s): DDIMER in the last 72 hours.  Cardiac Enzymes No results for input(s): CKMB, TROPONINI, MYOGLOBIN in the last 168 hours.  Invalid input(s): CK ------------------------------------------------------------------------------------------------------------------ Invalid input(s): [place]  08/20/14 0351 08/20/14 1130 08/20/14 2002 08/20/14 2102 08/21/14 0553 08/21/14 1128  GLUCAP 138* 327* 109* 204* 230* 161*    Kardell Virgil L M.D. [facility] 08/21/2014, 2:31 PM  Pager: [phone]   [email or web address] - password [facility]

## 2014-08-21 NOTE — Progress Notes (Signed)
Patient ID: [name], female   DOB: 1961-02-20, 54 y.o.   MRN: [id number]  [facility] Progress Note   Assessment/ Plan:   1. Large R Pleural Effusion : Status post thoracentesis, data indicative of an exudative pleural effusion by Light's criteria. Pleural fluid cultures negative and no indication of pulmonary mass/pneumonia with CT imaging. On empiric antibiotic therapy with vancomycin and Zosyn-would plan to switch to vancomycin/Fortaz should she need continued antibiotics beyond next week. 2. ESRD - status post hemodialysis yesterday that she tolerated without problems. 3. Hypertension/volume - blood pressures well controlled, careful ultrafiltration and hemodialysis yesterday to avoid intradialytic hypotension 4. Anemia -hemoglobin stable, continue to monitor-no overt loss  5. Metabolic bone disease -mildly hypophosphatemic-not on phosphate binder, start low-dose non-calcium containing binder  6. Nutrition - on carbohydrate modified renal diet and fluid restriction. 7. Left brachial vein DVT, right IJ DVT: Currently on heparin drip- appreciate input from vascular surgery with further management of this problem-plans noted for left central venogram and avoidance of anticoagulation with Coumadin if left innominate or SVC chronically occluded.  Subjective:   Reports to be feeling better-denies any acute problems overnight    Objective:   BP 129/55 mmHg  Pulse 102  Temp(Src) 98.5 F (36.9 C) (Oral)  Resp 18  Ht 5\' 2"  (1.575 m)  Wt 68 kg (149 lb 14.6 oz)  BMI 27.41 kg/m2  SpO2 95%  Physical Exam: Gen: Comfortably resting in bed-picking at dressing over her right thigh graft CVS: Pulse regular tachycardia, S1 and S2 normal Resp: Clear to auscultation, no rales Abd: Soft, obese, nontender Ext: No lower extremity edema  Labs: BMET  Recent Labs Lab 08/16/14 0112 08/16/14 0121 08/16/14 1730 08/17/14 0555 08/17/14 1040 08/18/14 0429 08/19/14 0426  08/20/14 0805  NA 129* 129* 130* 133*  --  128* 135 133*  K 4.7 4.7 4.7 5.9* 5.0 4.6 4.3 3.9  CL 85* 91* 88* 94*  --  90* 95* 94*  CO2 24  --  24 22  --  21 25 24   GLUCOSE 233* 239* 166* 205*  --  194* 117* 289*  BUN 63* 58* 75* 36*  --  52* 23 43*  CREATININE 12.41* 11.40* 13.51* 8.62*  --  10.86* 6.79* 10.45*  CALCIUM 9.2  --  8.4 8.4  --  8.0* 8.8 8.4  PHOS  --   --  6.4*  --   --   --   --  5.5*   CBC  Recent Labs Lab 08/16/14 0112  08/18/14 0910 08/19/14 0426 08/20/14 0805 08/21/14 0430  WBC 11.9*  < > 11.2* 11.7* 9.0 10.6*  NEUTROABS 9.1*  --   --   --   --   --   HGB 12.7  < > 10.5* 10.6* 10.1* 11.1*  HCT 37.9  < > 31.3* 32.4* 30.6* 33.9*  MCV 91.8  < > 91.0 94.7 91.3 93.1  PLT 324  < > 301 347 371 467*  < > = values in this interval not displayed.  Medications:    . amLODipine  10 mg Oral QHS  . clopidogrel  75 mg Oral Daily  . insulin aspart  0-15 Units Subcutaneous TID WC  . insulin aspart  0-5 Units Subcutaneous QHS  . multivitamin  1 tablet Oral Daily  . neomycin-bacitracin-polymyxin  1 application Topical Daily  . piperacillin-tazobactam (ZOSYN)  IV  2.25 g Intravenous 3 times per day  . polyethylene glycol  17 g Oral Daily  . sodium  chloride  500 mL Intravenous Once  . vancomycin  750 mg Intravenous Q M,W,F-HD   Elmarie Shiley, MD 08/21/2014, 8:04 AM

## 2014-08-21 NOTE — Procedures (Signed)
Interventional Radiology Procedure Note  Pre Findings:  [place] survey of the bilateral upper arms.   Left:  Occluded dialysis graft.  Occluded brachial vein.  Occluded Basilic vein.  Occluded cephalic vein.  Right: Occluded basilic vein.  Occluded cephalic vein. Patent Brachial vein.   Procedure: Venogram via the left EJ sheath, as there is no left upper venous access, and there is concern about accessing the single right upper extremity patent vein.   Post Findings: Injection of left EJ vein shows network of collateral draining veins through the anterior chest wall, with eventual filling of the Superior Vena cava via right sided chest wall collaterals.  There are segments of the left subclavian vein and brachiocephalic vein that fill, but there appears to be be occlusion of the left brachiocephalic vein at the confluence with the SVC.    Complications: No immediate Recommendations:  - Routine line care.   Signed,  Dulcy Fanny. Earleen Newport, DO

## 2014-08-23 DIAGNOSIS — J90 Pleural effusion, not elsewhere classified: Secondary | ICD-10-CM | POA: Diagnosis not present

## 2014-08-23 DIAGNOSIS — N186 End stage renal disease: Secondary | ICD-10-CM | POA: Diagnosis not present

## 2014-08-23 DIAGNOSIS — E1129 Type 2 diabetes mellitus with other diabetic kidney complication: Secondary | ICD-10-CM | POA: Diagnosis not present

## 2014-08-23 DIAGNOSIS — D631 Anemia in chronic kidney disease: Secondary | ICD-10-CM | POA: Diagnosis not present

## 2014-08-24 ENCOUNTER — Encounter: Payer: Self-pay | Admitting: Vascular Surgery

## 2014-08-25 ENCOUNTER — Ambulatory Visit: Payer: Medicare Other | Admitting: Vascular Surgery

## 2014-09-06 ENCOUNTER — Encounter: Payer: Self-pay | Admitting: Vascular Surgery

## 2014-09-08 ENCOUNTER — Ambulatory Visit (INDEPENDENT_AMBULATORY_CARE_PROVIDER_SITE_OTHER): Payer: Medicare Other | Admitting: Vascular Surgery

## 2014-09-08 ENCOUNTER — Encounter: Payer: Self-pay | Admitting: Vascular Surgery

## 2014-09-08 VITALS — BP 134/67 | HR 110 | Resp 16 | Ht 61.5 in | Wt 152.0 lb

## 2014-09-08 DIAGNOSIS — M79672 Pain in left foot: Secondary | ICD-10-CM

## 2014-09-08 HISTORY — PX: TOOTH EXTRACTION: SUR596

## 2014-09-08 NOTE — Progress Notes (Signed)
Vascular and Vein Specialist of [facility]  Patient name: [name] MRN: [id number] DOB: July 18, 1960 Sex: female  REASON FOR VISIT: Follow up  HPI: [name] is a 54 y.o. female who I last saw on 12/10/2013. I have been following a left foot wound. She had dry gangrene of the left heel and also an open ulcer on the lateral aspect of her left transmetatarsal amputation. 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 She had undergone PTA of the posterior tibial artery however this was occluded distally and there were no further options for revascularization. I recommended continued aggressive wound care by the wound care center. I felt that that if this failed to heal she would require below the knee amputation.  Since I saw her last, she states that the wounds on the lateral aspect of her foot have healed. The wound on the heel has improved significantly. She denies significant pain associated with the foot. As no real history of claudication although her activity is fairly limited.  Past Medical History  Diagnosis Date  . ESRD (end stage renal disease)   . Hypertension   . CVA (cerebral infarction)     right parietal 05/19/2000  . Vaginal bleeding   . Hyperparathyroidism   . Pneumonia   . Stroke     2002,no residual  . Peripheral vascular disease   . GERD (gastroesophageal reflux disease)   . Anemia   . DM (diabetes mellitus) type II controlled peripheral vascular disorder   . Slip/trip w/o falling due to stepping on object, subs July  2014   Family History  Problem Relation Age of Onset  . Hypertension Father   . Kidney disease Mother    SOCIAL HISTORY: History  Substance Use Topics  . Smoking status: Never Smoker   . Smokeless tobacco: Never Used  . Alcohol Use: Yes     Comment: occasional   Allergies    Allergen Reactions  . Doxycycline Itching  . Peroxyl [Hydrogen Peroxide] Hives and Rash   Current Outpatient Prescriptions  Medication Sig Dispense Refill  . Acetaminophen (TYLENOL PO) Take 325-650 mg by mouth See admin instructions. Uses at dialysis Mon, Wed, Fridays each week for muscle spasms    . amLODipine (NORVASC) 10 MG tablet Take 1 tablet (10 mg total) by mouth at bedtime. 30 tablet 2  . aspirin 81 MG tablet Take 1 tablet (81 mg total) by mouth daily. 90 tablet 3  . CALCIUM-VITAMIN D PO Take 500 mg by mouth 3 (three) times daily with meals.     . Clopidogrel Bisulfate (PLAVIX PO) Take 75 mg by mouth daily.     . Cyanocobalamin (VITAMIN B-12 PO) Take 1 tablet by mouth 4 (four) times a week. Non dialysis days. Tues, Thurs, Sat and Sunday    . diphenhydrAMINE (BENADRYL) 25 MG tablet Take 25 mg by mouth every 6 (six) hours as needed (Muscle spasm in feet).    Marland Kitchen glipiZIDE (GLUCOTROL) 10 MG tablet Take 0.5 tablets (5 mg total) by mouth daily. 45 tablet 3  . HYDROcodone-acetaminophen (NORCO/VICODIN) 5-325 MG per tablet as needed.  0  . multivitamin (RENA-VIT) TABS tablet Take 1 tablet by mouth daily.    Marland Kitchen neomycin-bacitracin-polymyxin (NEOSPORIN) [phone] ointment Apply 1 application topically daily. Apply to foot    . polyethylene glycol (MIRALAX / GLYCOLAX) packet Take 17 g by mouth daily. Sayner  each 0  . traMADol (ULTRAM) 50 MG tablet Take 50 mg by mouth 2 (two) times daily as needed for moderate pain (for foot pain). For foot pain  0   No current facility-administered medications for this visit.   REVIEW OF SYSTEMS: Valu.Nieves ] denotes positive finding; [  ] denotes negative finding  CARDIOVASCULAR:  [ ]  chest pain   [ ]  chest pressure   [ ]  palpitations   [ ]  orthopnea   [ ]  dyspnea on exertion   [ ]  claudication   [ ]  rest pain   [ ]  DVT   [ ]  phlebitis PULMONARY:   [ ]  productive cough   [ ]  asthma   [ ]  wheezing NEUROLOGIC:   [ ]  weakness  [ ]  paresthesias  [ ]  aphasia  [ ]  amaurosis  [  ] dizziness HEMATOLOGIC:   [ ]  bleeding problems   [ ]  clotting disorders MUSCULOSKELETAL:  [ ]  joint pain   [ ]  joint swelling [ ]  leg swelling GASTROINTESTINAL: [ ]   blood in stool  [ ]   hematemesis GENITOURINARY:  [ ]   dysuria  [ ]   hematuria PSYCHIATRIC:  [ ]  history of major depression INTEGUMENTARY:  [ ]  rashes  [ ]  ulcers CONSTITUTIONAL:  [ ]  fever   [ ]  chills  PHYSICAL EXAM: Filed Vitals:   09/08/14 1607  BP: 134/67  Pulse: 110  Resp: 16  Height: 5' 1.5" (1.562 m)  Weight: 152 lb (68.947 kg)  SpO2: 100%   GENERAL: The patient is a well-nourished female, in no acute distress. The vital signs are documented above. CARDIOVASCULAR: There is a regular rate and rhythm. I do not to take carotid bruits. She has diminished but palpable femoral pulses. I cannot palpate pedal pulses. PULMONARY: There is good air exchange bilaterally without wheezing or rales. ABDOMEN: Soft and non-tender with normal pitched bowel sounds.  MUSCULOSKELETAL: There are no major deformities or cyanosis. NEUROLOGIC: No focal weakness or paresthesias are detected. SKIN: The transmetatarsal amputation site has healed. There is a wound on the medial aspect of her left heel which measures 3.5 centimeters in length by 2.5 cm in width.  DATA:  I have independently interpreted her duplex of the left lower extremity which shows that her stent is patent although there are increased velocities at the distal aspect of the stent with a peak systolic velocity of 562 cm/s.  MEDICAL ISSUES:  ATHEROSCLEROSIS WITH ULCERATION: The wound on the transmetatarsal amputation site has healed. The wound on the heel is improving and looks fairly well perfused. There is good granulation tissue. Given that there is improvement we'll continue with aggressive wound care. If this fails to improve consideration could be given to arteriography to evaluate the stenosis just distal to the stent on the left. This work was done and all he can  certainly if she wishes to follow up with them that's fine also. However given that the wounds are improving and the stenosis does not appear to be severe we'll continue with conservative treatment. I plan on seeing her back in 3 months. She knows to call sooner if she has problems.    No Follow-up on file.   Deitra Mayo Vascular and Vein Specialists of [place]: [phone]

## 2014-09-21 DIAGNOSIS — N186 End stage renal disease: Secondary | ICD-10-CM | POA: Diagnosis not present

## 2014-09-21 DIAGNOSIS — Z992 Dependence on renal dialysis: Secondary | ICD-10-CM | POA: Diagnosis not present

## 2014-09-21 DIAGNOSIS — E1129 Type 2 diabetes mellitus with other diabetic kidney complication: Secondary | ICD-10-CM | POA: Diagnosis not present

## 2014-09-22 DIAGNOSIS — N186 End stage renal disease: Secondary | ICD-10-CM | POA: Diagnosis not present

## 2014-09-22 DIAGNOSIS — E1129 Type 2 diabetes mellitus with other diabetic kidney complication: Secondary | ICD-10-CM | POA: Diagnosis not present

## 2014-09-24 DIAGNOSIS — E1129 Type 2 diabetes mellitus with other diabetic kidney complication: Secondary | ICD-10-CM | POA: Diagnosis not present

## 2014-09-24 DIAGNOSIS — N186 End stage renal disease: Secondary | ICD-10-CM | POA: Diagnosis not present

## 2014-09-27 DIAGNOSIS — N186 End stage renal disease: Secondary | ICD-10-CM | POA: Diagnosis not present

## 2014-09-27 DIAGNOSIS — E1129 Type 2 diabetes mellitus with other diabetic kidney complication: Secondary | ICD-10-CM | POA: Diagnosis not present

## 2014-09-29 DIAGNOSIS — E1129 Type 2 diabetes mellitus with other diabetic kidney complication: Secondary | ICD-10-CM | POA: Diagnosis not present

## 2014-09-29 DIAGNOSIS — N186 End stage renal disease: Secondary | ICD-10-CM | POA: Diagnosis not present

## 2014-10-01 DIAGNOSIS — N186 End stage renal disease: Secondary | ICD-10-CM | POA: Diagnosis not present

## 2014-10-01 DIAGNOSIS — E1129 Type 2 diabetes mellitus with other diabetic kidney complication: Secondary | ICD-10-CM | POA: Diagnosis not present

## 2014-10-04 DIAGNOSIS — N186 End stage renal disease: Secondary | ICD-10-CM | POA: Diagnosis not present

## 2014-10-04 DIAGNOSIS — E1129 Type 2 diabetes mellitus with other diabetic kidney complication: Secondary | ICD-10-CM | POA: Diagnosis not present

## 2014-10-05 DIAGNOSIS — Z992 Dependence on renal dialysis: Secondary | ICD-10-CM | POA: Diagnosis not present

## 2014-10-05 DIAGNOSIS — I871 Compression of vein: Secondary | ICD-10-CM | POA: Diagnosis not present

## 2014-10-05 DIAGNOSIS — N186 End stage renal disease: Secondary | ICD-10-CM | POA: Diagnosis not present

## 2014-10-05 DIAGNOSIS — T82858D Stenosis of vascular prosthetic devices, implants and grafts, subsequent encounter: Secondary | ICD-10-CM | POA: Diagnosis not present

## 2014-10-06 DIAGNOSIS — N186 End stage renal disease: Secondary | ICD-10-CM | POA: Diagnosis not present

## 2014-10-06 DIAGNOSIS — E1129 Type 2 diabetes mellitus with other diabetic kidney complication: Secondary | ICD-10-CM | POA: Diagnosis not present

## 2014-10-08 DIAGNOSIS — E1129 Type 2 diabetes mellitus with other diabetic kidney complication: Secondary | ICD-10-CM | POA: Diagnosis not present

## 2014-10-08 DIAGNOSIS — N186 End stage renal disease: Secondary | ICD-10-CM | POA: Diagnosis not present

## 2014-10-11 DIAGNOSIS — E1129 Type 2 diabetes mellitus with other diabetic kidney complication: Secondary | ICD-10-CM | POA: Diagnosis not present

## 2014-10-11 DIAGNOSIS — N186 End stage renal disease: Secondary | ICD-10-CM | POA: Diagnosis not present

## 2014-10-13 DIAGNOSIS — E1129 Type 2 diabetes mellitus with other diabetic kidney complication: Secondary | ICD-10-CM | POA: Diagnosis not present

## 2014-10-13 DIAGNOSIS — N186 End stage renal disease: Secondary | ICD-10-CM | POA: Diagnosis not present

## 2014-10-15 DIAGNOSIS — N186 End stage renal disease: Secondary | ICD-10-CM | POA: Diagnosis not present

## 2014-10-15 DIAGNOSIS — E1129 Type 2 diabetes mellitus with other diabetic kidney complication: Secondary | ICD-10-CM | POA: Diagnosis not present

## 2014-10-18 DIAGNOSIS — N186 End stage renal disease: Secondary | ICD-10-CM | POA: Diagnosis not present

## 2014-10-18 DIAGNOSIS — E1129 Type 2 diabetes mellitus with other diabetic kidney complication: Secondary | ICD-10-CM | POA: Diagnosis not present

## 2014-10-20 DIAGNOSIS — N186 End stage renal disease: Secondary | ICD-10-CM | POA: Diagnosis not present

## 2014-10-20 DIAGNOSIS — E1129 Type 2 diabetes mellitus with other diabetic kidney complication: Secondary | ICD-10-CM | POA: Diagnosis not present

## 2014-10-21 DIAGNOSIS — Z992 Dependence on renal dialysis: Secondary | ICD-10-CM | POA: Diagnosis not present

## 2014-10-21 DIAGNOSIS — N186 End stage renal disease: Secondary | ICD-10-CM | POA: Diagnosis not present

## 2014-10-21 DIAGNOSIS — E1129 Type 2 diabetes mellitus with other diabetic kidney complication: Secondary | ICD-10-CM | POA: Diagnosis not present

## 2014-10-22 DIAGNOSIS — N186 End stage renal disease: Secondary | ICD-10-CM | POA: Diagnosis not present

## 2014-10-22 DIAGNOSIS — A4902 Methicillin resistant Staphylococcus aureus infection, unspecified site: Secondary | ICD-10-CM | POA: Diagnosis not present

## 2014-10-22 DIAGNOSIS — E1129 Type 2 diabetes mellitus with other diabetic kidney complication: Secondary | ICD-10-CM | POA: Diagnosis not present

## 2014-10-22 DIAGNOSIS — N2581 Secondary hyperparathyroidism of renal origin: Secondary | ICD-10-CM | POA: Diagnosis not present

## 2014-10-25 DIAGNOSIS — N2581 Secondary hyperparathyroidism of renal origin: Secondary | ICD-10-CM | POA: Diagnosis not present

## 2014-10-25 DIAGNOSIS — E1129 Type 2 diabetes mellitus with other diabetic kidney complication: Secondary | ICD-10-CM | POA: Diagnosis not present

## 2014-10-25 DIAGNOSIS — A4902 Methicillin resistant Staphylococcus aureus infection, unspecified site: Secondary | ICD-10-CM | POA: Diagnosis not present

## 2014-10-25 DIAGNOSIS — N186 End stage renal disease: Secondary | ICD-10-CM | POA: Diagnosis not present

## 2014-10-27 DIAGNOSIS — A4902 Methicillin resistant Staphylococcus aureus infection, unspecified site: Secondary | ICD-10-CM | POA: Diagnosis not present

## 2014-10-27 DIAGNOSIS — N2581 Secondary hyperparathyroidism of renal origin: Secondary | ICD-10-CM | POA: Diagnosis not present

## 2014-10-27 DIAGNOSIS — E1129 Type 2 diabetes mellitus with other diabetic kidney complication: Secondary | ICD-10-CM | POA: Diagnosis not present

## 2014-10-27 DIAGNOSIS — N186 End stage renal disease: Secondary | ICD-10-CM | POA: Diagnosis not present

## 2014-10-29 DIAGNOSIS — N186 End stage renal disease: Secondary | ICD-10-CM | POA: Diagnosis not present

## 2014-10-29 DIAGNOSIS — E1129 Type 2 diabetes mellitus with other diabetic kidney complication: Secondary | ICD-10-CM | POA: Diagnosis not present

## 2014-10-29 DIAGNOSIS — N2581 Secondary hyperparathyroidism of renal origin: Secondary | ICD-10-CM | POA: Diagnosis not present

## 2014-10-29 DIAGNOSIS — A4902 Methicillin resistant Staphylococcus aureus infection, unspecified site: Secondary | ICD-10-CM | POA: Diagnosis not present

## 2014-11-01 DIAGNOSIS — N186 End stage renal disease: Secondary | ICD-10-CM | POA: Diagnosis not present

## 2014-11-01 DIAGNOSIS — A4902 Methicillin resistant Staphylococcus aureus infection, unspecified site: Secondary | ICD-10-CM | POA: Diagnosis not present

## 2014-11-01 DIAGNOSIS — E1129 Type 2 diabetes mellitus with other diabetic kidney complication: Secondary | ICD-10-CM | POA: Diagnosis not present

## 2014-11-01 DIAGNOSIS — N2581 Secondary hyperparathyroidism of renal origin: Secondary | ICD-10-CM | POA: Diagnosis not present

## 2014-11-03 ENCOUNTER — Telehealth: Payer: Self-pay

## 2014-11-03 DIAGNOSIS — A4902 Methicillin resistant Staphylococcus aureus infection, unspecified site: Secondary | ICD-10-CM | POA: Diagnosis not present

## 2014-11-03 DIAGNOSIS — N186 End stage renal disease: Secondary | ICD-10-CM | POA: Diagnosis not present

## 2014-11-03 DIAGNOSIS — E1129 Type 2 diabetes mellitus with other diabetic kidney complication: Secondary | ICD-10-CM | POA: Diagnosis not present

## 2014-11-03 DIAGNOSIS — N2581 Secondary hyperparathyroidism of renal origin: Secondary | ICD-10-CM | POA: Diagnosis not present

## 2014-11-03 NOTE — Telephone Encounter (Signed)
rec'd phone call from [facility].  Reported pt. Has a silver dollar-size open sore on left medical heel.  Stated it is draining a yellow- green substance.  Reported the wound bed is red, and the borders of the ulcer are yellow/ green.  Deneid any fever/ chills.  Stated the open area has worsened over past 2 weeks.  Questioned if pt. needs to return to [place], or to see Dr. [name].  Discussed with Dr. [name].  Recommended her to be evaluated at [facility], since the last progress note indicated there are no further options for revascularization.  Notified Angie @ [facility].  Given recommendations by Dr. [name].  Advised appt. Will be scheduled and the kidney center will be notified of appt.  Agrees with plan.

## 2014-11-04 NOTE — Telephone Encounter (Signed)
I have been unable to reach Angie at [facility], but did speak with Ms Seyer. She agreed to the appointment and stated that she would tell Angie so that transportation can be arranged, dpm

## 2014-11-05 DIAGNOSIS — A4902 Methicillin resistant Staphylococcus aureus infection, unspecified site: Secondary | ICD-10-CM | POA: Diagnosis not present

## 2014-11-05 DIAGNOSIS — N186 End stage renal disease: Secondary | ICD-10-CM | POA: Diagnosis not present

## 2014-11-05 DIAGNOSIS — E1129 Type 2 diabetes mellitus with other diabetic kidney complication: Secondary | ICD-10-CM | POA: Diagnosis not present

## 2014-11-05 DIAGNOSIS — N2581 Secondary hyperparathyroidism of renal origin: Secondary | ICD-10-CM | POA: Diagnosis not present

## 2014-11-08 ENCOUNTER — Encounter: Payer: Self-pay | Admitting: Vascular Surgery

## 2014-11-08 DIAGNOSIS — N2581 Secondary hyperparathyroidism of renal origin: Secondary | ICD-10-CM | POA: Diagnosis not present

## 2014-11-08 DIAGNOSIS — N186 End stage renal disease: Secondary | ICD-10-CM | POA: Diagnosis not present

## 2014-11-08 DIAGNOSIS — A4902 Methicillin resistant Staphylococcus aureus infection, unspecified site: Secondary | ICD-10-CM | POA: Diagnosis not present

## 2014-11-08 DIAGNOSIS — E1129 Type 2 diabetes mellitus with other diabetic kidney complication: Secondary | ICD-10-CM | POA: Diagnosis not present

## 2014-11-10 ENCOUNTER — Encounter: Payer: Self-pay | Admitting: Vascular Surgery

## 2014-11-10 ENCOUNTER — Ambulatory Visit (INDEPENDENT_AMBULATORY_CARE_PROVIDER_SITE_OTHER): Payer: Medicare Other | Admitting: Vascular Surgery

## 2014-11-10 VITALS — BP 85/58 | HR 111 | Temp 98.2°F | Ht 61.5 in | Wt 152.0 lb

## 2014-11-10 DIAGNOSIS — L97909 Non-pressure chronic ulcer of unspecified part of unspecified lower leg with unspecified severity: Secondary | ICD-10-CM

## 2014-11-10 DIAGNOSIS — A4902 Methicillin resistant Staphylococcus aureus infection, unspecified site: Secondary | ICD-10-CM | POA: Diagnosis not present

## 2014-11-10 DIAGNOSIS — I70299 Other atherosclerosis of native arteries of extremities, unspecified extremity: Secondary | ICD-10-CM | POA: Diagnosis not present

## 2014-11-10 DIAGNOSIS — N2581 Secondary hyperparathyroidism of renal origin: Secondary | ICD-10-CM | POA: Diagnosis not present

## 2014-11-10 DIAGNOSIS — N186 End stage renal disease: Secondary | ICD-10-CM | POA: Diagnosis not present

## 2014-11-10 DIAGNOSIS — E1129 Type 2 diabetes mellitus with other diabetic kidney complication: Secondary | ICD-10-CM | POA: Diagnosis not present

## 2014-11-10 NOTE — Progress Notes (Signed)
Vascular and Vein Specialist of [facility]  Patient name: [name] MRN: [id number] DOB: 1960/10/11 Sex: female  REASON FOR VISIT: Follow up of wound on left heel  HPI: [name] is a 54 y.o. female who I last saw on 09/08/2014. I had been following a wound on her left transmetatarsal amputation site which had healed. She also had a wound on her heel that was improving. It had good granulation tissue and appeared reasonably well perfused.  The kidney center called reporting a  large wound on the left medial heel which was draining and for this reason she set up for an office visit. She states that she fell and injured her heel and this is when the wound developed. He has been started on an about it during dialysis treatments. She has a right thigh AV graft which has been working well. She dialyzes on Monday Wednesdays and Fridays.  Of note this patient underwent successful balloon angioplasty of a tight left popliteal artery stenosis by Dr. Trula Slade on 05/27/2012. At that time she had two-vessel runoff via the posterior tibial and anterior tibial arteries. She is not a very good historian, however, I believe that subsequently she has undergone an endovascular procedure in [place] [facility]. In reviewing my notes at one point I was able to review this arteriogram which showed diffuse superficial femoral artery occlusive disease with a dominant runoff being the anterior tibial artery. Apparently in [place] they had tried to stent the posterior tibial artery but this was occluded and I did not think there were further options for revascularization.   I performed her transmetatarsal amputation on 07/15/2012.  A duplex scan at the last visit however showed that she had a stent in the left lower extremity with elevated velocities at the distal aspect of the stent with a peak systolic velocity of 637 cm/s. Given that the wound was improving in the left leg we decided to hold off on  arteriography to evaluate the stenosis in the stent.   Past Medical History  Diagnosis Date  . ESRD (end stage renal disease)   . Hypertension   . CVA (cerebral infarction)     right parietal 05/19/2000  . Vaginal bleeding   . Hyperparathyroidism   . Pneumonia   . Stroke     2002,no residual  . Peripheral vascular disease   . GERD (gastroesophageal reflux disease)   . Anemia   . DM (diabetes mellitus) type II controlled peripheral vascular disorder   . Slip/trip w/o falling due to stepping on object, subs July  2014   Family History  Problem Relation Age of Onset  . Hypertension Father   . Kidney disease Mother    SOCIAL HISTORY: History  Substance Use Topics  . Smoking status: Never Smoker   . Smokeless tobacco: Never Used  . Alcohol Use: Yes     Comment: occasional   Allergies  Allergen Reactions  . Doxycycline Itching  . Peroxyl [Hydrogen Peroxide] Hives and Rash   Current Outpatient Prescriptions  Medication Sig Dispense Refill  . Acetaminophen (TYLENOL PO) Take 325-650 mg by mouth See admin instructions. Uses at dialysis Mon, Wed, Fridays each week for muscle spasms    . amLODipine (NORVASC) 10 MG tablet Take 1 tablet (10 mg total) by mouth at bedtime. 30 tablet 2  . aspirin 81 MG tablet Take 1 tablet (81 mg total) by mouth daily. 90 tablet 3  . CALCIUM-VITAMIN D PO Take 500 mg by mouth 3 (three) times daily  with meals.     . Clopidogrel Bisulfate (PLAVIX PO) Take 75 mg by mouth daily.     . Cyanocobalamin (VITAMIN B-12 PO) Take 1 tablet by mouth 4 (four) times a week. Non dialysis days. Tues, Thurs, Sat and Sunday    . diphenhydrAMINE (BENADRYL) 25 MG tablet Take 25 mg by mouth every 6 (six) hours as needed (Muscle spasm in feet).    Marland Kitchen glipiZIDE (GLUCOTROL) 10 MG tablet Take 0.5 tablets (5 mg total) by mouth daily. 45 tablet 3  . HYDROcodone-acetaminophen (NORCO/VICODIN) 5-325 MG per tablet as needed.  0  . multivitamin (RENA-VIT) TABS tablet Take 1 tablet by  mouth daily.    Marland Kitchen neomycin-bacitracin-polymyxin (NEOSPORIN) [phone] ointment Apply 1 application topically daily. Apply to foot    . polyethylene glycol (MIRALAX / GLYCOLAX) packet Take 17 g by mouth daily. 14 each 0  . traMADol (ULTRAM) 50 MG tablet Take 50 mg by mouth 2 (two) times daily as needed for moderate pain (for foot pain). For foot pain  0   No current facility-administered medications for this visit.   REVIEW OF SYSTEMS: Valu.Nieves ] denotes positive finding; [  ] denotes negative finding  CARDIOVASCULAR:  [ ]  chest pain   [ ]  chest pressure   [ ]  palpitations   [ ]  orthopnea   [ ]  dyspnea on exertion   [ ]  claudication   [ ]  rest pain   [ ]  DVT   [ ]  phlebitis PULMONARY:   [ ]  productive cough   [ ]  asthma   [ ]  wheezing NEUROLOGIC:   [ ]  weakness  [ ]  paresthesias  [ ]  aphasia  [ ]  amaurosis  [ ]  dizziness HEMATOLOGIC:   [ ]  bleeding problems   [ ]  clotting disorders MUSCULOSKELETAL:  [ ]  joint pain   [ ]  joint swelling [ ]  leg swelling GASTROINTESTINAL: [ ]   blood in stool  [ ]   hematemesis GENITOURINARY:  [ ]   dysuria  [ ]   hematuria PSYCHIATRIC:  [ ]  history of major depression INTEGUMENTARY:  [ ]  rashes  [ ]  ulcers CONSTITUTIONAL:  [ ]  fever   [ ]  chills  PHYSICAL EXAM: There were no vitals filed for this visit. GENERAL: The patient is a well-nourished female, in no acute distress. The vital signs are documented above. CARDIAC: There is a regular rate and rhythm.  VASCULAR: she has a right thigh AV graft which has a good bruit and thrill. She has a palpable left femoral pulse. She has monophasic Doppler signals in the left foot. PULMONARY: There is good air exchange bilaterally without wheezing or rales. ABDOMEN: Soft and non-tender with normal pitched bowel sounds.  MUSCULOSKELETAL: she has a transmetatarsal amputation on the left foot.NEUROLOGIC: No focal weakness or paresthesias are detected. SKIN: There is a large wound on the left heel on the medial aspect which measures  35 mm in width and 20 mm in width. I did some excisional debridement of this in the office today. There is currently no significant drainage. PSYCHIATRIC: The patient has a normal affect.  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 She now has an extensive wound on the left heel and this is clearly a limb threatening problem. A previous duplex had suggested some increased velocities at the distal aspect of her stent in the popliteal artery and I think that it is worth restudying her  to be sure that there are no further options for revascularization. We would have to cannulate the left side given her graft in the right thigh. If there are no options for revascularization and she will ultimately likely require a BKA. She dialyzes on Monday Wednesdays and Fridays and she would like to schedule this on a Tuesday or Thursday. We will make further recommendations pending these results.    Deitra Mayo [facility] Vein Specialists of [place]: [phone]

## 2014-11-11 ENCOUNTER — Encounter: Payer: Self-pay | Admitting: Nephrology

## 2014-11-12 DIAGNOSIS — N2581 Secondary hyperparathyroidism of renal origin: Secondary | ICD-10-CM | POA: Diagnosis not present

## 2014-11-12 DIAGNOSIS — E1129 Type 2 diabetes mellitus with other diabetic kidney complication: Secondary | ICD-10-CM | POA: Diagnosis not present

## 2014-11-12 DIAGNOSIS — A4902 Methicillin resistant Staphylococcus aureus infection, unspecified site: Secondary | ICD-10-CM | POA: Diagnosis not present

## 2014-11-12 DIAGNOSIS — N186 End stage renal disease: Secondary | ICD-10-CM | POA: Diagnosis not present

## 2014-11-15 DIAGNOSIS — N2581 Secondary hyperparathyroidism of renal origin: Secondary | ICD-10-CM | POA: Diagnosis not present

## 2014-11-15 DIAGNOSIS — A4902 Methicillin resistant Staphylococcus aureus infection, unspecified site: Secondary | ICD-10-CM | POA: Diagnosis not present

## 2014-11-15 DIAGNOSIS — N186 End stage renal disease: Secondary | ICD-10-CM | POA: Diagnosis not present

## 2014-11-15 DIAGNOSIS — E1129 Type 2 diabetes mellitus with other diabetic kidney complication: Secondary | ICD-10-CM | POA: Diagnosis not present

## 2014-11-17 DIAGNOSIS — A4902 Methicillin resistant Staphylococcus aureus infection, unspecified site: Secondary | ICD-10-CM | POA: Diagnosis not present

## 2014-11-17 DIAGNOSIS — N186 End stage renal disease: Secondary | ICD-10-CM | POA: Diagnosis not present

## 2014-11-17 DIAGNOSIS — N2581 Secondary hyperparathyroidism of renal origin: Secondary | ICD-10-CM | POA: Diagnosis not present

## 2014-11-17 DIAGNOSIS — E1129 Type 2 diabetes mellitus with other diabetic kidney complication: Secondary | ICD-10-CM | POA: Diagnosis not present

## 2014-11-19 DIAGNOSIS — A4902 Methicillin resistant Staphylococcus aureus infection, unspecified site: Secondary | ICD-10-CM | POA: Diagnosis not present

## 2014-11-19 DIAGNOSIS — E1129 Type 2 diabetes mellitus with other diabetic kidney complication: Secondary | ICD-10-CM | POA: Diagnosis not present

## 2014-11-19 DIAGNOSIS — N2581 Secondary hyperparathyroidism of renal origin: Secondary | ICD-10-CM | POA: Diagnosis not present

## 2014-11-19 DIAGNOSIS — N186 End stage renal disease: Secondary | ICD-10-CM | POA: Diagnosis not present

## 2014-11-20 ENCOUNTER — Encounter (HOSPITAL_COMMUNITY): Payer: Self-pay | Admitting: Emergency Medicine

## 2014-11-20 ENCOUNTER — Emergency Department (HOSPITAL_COMMUNITY)
Admission: EM | Admit: 2014-11-20 | Discharge: 2014-11-20 | Disposition: A | Discharge status: Home / Self Care | Payer: Medicare Other | Attending: Physician Assistant | Admitting: Physician Assistant

## 2014-11-20 DIAGNOSIS — Y999 Unspecified external cause status: Secondary | ICD-10-CM | POA: Diagnosis not present

## 2014-11-20 DIAGNOSIS — Z992 Dependence on renal dialysis: Secondary | ICD-10-CM | POA: Insufficient documentation

## 2014-11-20 DIAGNOSIS — K219 Gastro-esophageal reflux disease without esophagitis: Secondary | ICD-10-CM | POA: Diagnosis not present

## 2014-11-20 DIAGNOSIS — Z7902 Long term (current) use of antithrombotics/antiplatelets: Secondary | ICD-10-CM | POA: Diagnosis not present

## 2014-11-20 DIAGNOSIS — Z8701 Personal history of pneumonia (recurrent): Secondary | ICD-10-CM | POA: Insufficient documentation

## 2014-11-20 DIAGNOSIS — I120 Hypertensive chronic kidney disease with stage 5 chronic kidney disease or end stage renal disease: Secondary | ICD-10-CM | POA: Diagnosis not present

## 2014-11-20 DIAGNOSIS — S91302A Unspecified open wound, left foot, initial encounter: Secondary | ICD-10-CM | POA: Diagnosis not present

## 2014-11-20 DIAGNOSIS — E213 Hyperparathyroidism, unspecified: Secondary | ICD-10-CM | POA: Diagnosis not present

## 2014-11-20 DIAGNOSIS — Z9889 Other specified postprocedural states: Secondary | ICD-10-CM | POA: Diagnosis not present

## 2014-11-20 DIAGNOSIS — Z862 Personal history of diseases of the blood and blood-forming organs and certain disorders involving the immune mechanism: Secondary | ICD-10-CM | POA: Insufficient documentation

## 2014-11-20 DIAGNOSIS — N186 End stage renal disease: Secondary | ICD-10-CM | POA: Diagnosis not present

## 2014-11-20 DIAGNOSIS — Z79899 Other long term (current) drug therapy: Secondary | ICD-10-CM | POA: Insufficient documentation

## 2014-11-20 DIAGNOSIS — E119 Type 2 diabetes mellitus without complications: Secondary | ICD-10-CM | POA: Diagnosis not present

## 2014-11-20 DIAGNOSIS — X58XXXA Exposure to other specified factors, initial encounter: Secondary | ICD-10-CM | POA: Diagnosis not present

## 2014-11-20 DIAGNOSIS — Y939 Activity, unspecified: Secondary | ICD-10-CM | POA: Diagnosis not present

## 2014-11-20 DIAGNOSIS — Z7982 Long term (current) use of aspirin: Secondary | ICD-10-CM | POA: Diagnosis not present

## 2014-11-20 DIAGNOSIS — Z8673 Personal history of transient ischemic attack (TIA), and cerebral infarction without residual deficits: Secondary | ICD-10-CM | POA: Diagnosis not present

## 2014-11-20 DIAGNOSIS — Y929 Unspecified place or not applicable: Secondary | ICD-10-CM | POA: Diagnosis not present

## 2014-11-20 DIAGNOSIS — S91309A Unspecified open wound, unspecified foot, initial encounter: Secondary | ICD-10-CM | POA: Diagnosis not present

## 2014-11-20 NOTE — ED Notes (Signed)
Bed: AU45 Expected date:  Expected time:  Means of arrival:  Comments: EMS-Foot wound

## 2014-11-20 NOTE — ED Notes (Signed)
Pt reports L foot wound to heel x3 months. Pt sts that she had dialysis yesterday. Pt is A&O and in NAd. Pt adds that she stopped going to wound care center "a long time ago."

## 2014-11-20 NOTE — ED Notes (Signed)
Pt has called a friend to pick her up. Pt allowed to wait in room d/t not having walker to ambulate and condition of her foot

## 2014-11-20 NOTE — ED Provider Notes (Signed)
Medical screening examination/treatment/procedure(s) were conducted as a shared visit with non-physician practitioner(s) and myself.  I personally evaluated the patient during the encounter.  I saw patient with APP and agree with the assessment.   Patient is presenting with chronic wound infection. Patient seen by vascular surgeon for this and was seen recently. No changes since that last visit. It appears to be in nonhealing wound but with no acute infection. No purulence. Patient has no fevers. No increasing pain do not suspect any further vascular, compromise. Vascular surgeon plan to do imaging and potentially BKA. We'll defer to them for further treatment.   Rollie Hynek Julio Alm, MD 11/20/14 1004

## 2014-11-20 NOTE — ED Notes (Signed)
Pt from home via EMS-Per EMS reports foot wound x3 months. Pt received abx last week for infection with no response. Pt has hx of DM and is a dialysis pt. Pt is A&O and in NAD. Pt ambulates with walker w/o assistance

## 2014-11-20 NOTE — ED Provider Notes (Signed)
CSN: [id number]     Arrival date & time 11/20/14  0814 History   First MD Initiated Contact with Patient 11/20/14 [id number]     Chief Complaint  Patient presents with  . Wound Infection     (Consider location/radiation/quality/duration/timing/severity/associated sxs/prior Treatment) HPI [name] is a 54 y.o. female with multiple medical problems including ESRD on dialysis, right foot metatarsal amputation, comes in for evaluation of possible wound infection. Patient states she has a wound on the inside of her left foot has been present for the past 3 months and she is concerned it may be infected. She denies any fevers, chills, nausea, vomiting or she does report associated mild tenderness to the bottom of her foot, no overt wound drainage. She reports being given antibiotics by the dialysis center last week. She reports being compliant with these medications. She also reports she has a follow-up with vein and vascular next week.  Past Medical History  Diagnosis Date  . ESRD (end stage renal disease)   . Hypertension   . CVA (cerebral infarction)     right parietal 05/19/2000  . Vaginal bleeding   . Hyperparathyroidism   . Pneumonia   . Stroke     2002,no residual  . Peripheral vascular disease   . GERD (gastroesophageal reflux disease)   . Anemia   . DM (diabetes mellitus) type II controlled peripheral vascular disorder   . Slip/trip w/o falling due to stepping on object, subs July  2014   Past Surgical History  Procedure Laterality Date  . Parathyroidectomy with autotransplant  12/07/2010  . Knee surgery      right, x2  . Brachial artery graft      x2 for dialysis  . Av fistula placement      upper rt thigh  . Dental extractions Bilateral   . Dilation and curettage of uterus      hx: of 1986  . Transmetatarsal amputation Left 07/15/2012    Procedure: TRANSMETATARSAL AMPUTATION;  Surgeon: Angelia Mould, MD;  Location: [facility] OR;  Service: Vascular;  Laterality: Left;  .  Leg surgery Right   . Foot surgery Left     5 TOES AMPUTATED  . Abdominal aortagram N/A 05/27/2012    Procedure: ABDOMINAL Maxcine Ham;  Surgeon: Serafina Mitchell, MD;  Location: [facility] CATH LAB;  Service: Cardiovascular;  Laterality: N/A;  . Lower extremity angiogram Left 05/27/2012    Procedure: LOWER EXTREMITY ANGIOGRAM;  Surgeon: Serafina Mitchell, MD;  Location: [facility] CATH LAB;  Service: Cardiovascular;  Laterality: Left;  lt leg angio  . Tooth extraction  Sep 08, 2014   Family History  Problem Relation Age of Onset  . Hypertension Father   . Kidney disease Mother    History  Substance Use Topics  . Smoking status: Never Smoker   . Smokeless tobacco: Never Used  . Alcohol Use: Yes     Comment: occasional   OB History    No data available     Review of Systems A 10 point review of systems was completed and was negative except for pertinent positives and negatives as mentioned in the history of present illness     Allergies  Doxycycline and Peroxyl  Home Medications   Prior to Admission medications   Medication Sig Start Date End Date Taking? Authorizing Provider  acetaminophen (TYLENOL) 325 MG tablet Take 650 mg by mouth every Monday, Wednesday, and Friday. With dialysis   Yes Historical Provider, MD  amLODipine (NORVASC) 10 MG tablet  Take 1 tablet (10 mg total) by mouth at bedtime. 08/19/14  Yes Reyne Dumas, MD  aspirin 81 MG tablet Take 1 tablet (81 mg total) by mouth daily. 01/19/14  Yes Lance Bosch, NP  CALCIUM-VITAMIN D PO Take 500 mg by mouth 3 (three) times daily with meals.    Yes Historical Provider, MD  Clopidogrel Bisulfate (PLAVIX PO) Take 75 mg by mouth daily.    Yes Historical Provider, MD  Cyanocobalamin (VITAMIN B-12 PO) Take 1 tablet by mouth 4 (four) times a week. Non dialysis days. Cain Saupe, Sat and Sunday   Yes Historical Provider, MD  diazepam (VALIUM) 5 MG tablet Take 5 mg by mouth every 8 (eight) hours as needed for muscle spasms.  11/15/14  Yes Historical  Provider, MD  diphenhydrAMINE (BENADRYL) 25 MG tablet Take 25 mg by mouth every 6 (six) hours as needed (Muscle spasm in feet).   Yes Historical Provider, MD  glipiZIDE (GLUCOTROL) 10 MG tablet Take 0.5 tablets (5 mg total) by mouth daily. 01/19/14  Yes Lance Bosch, NP  multivitamin (RENA-VIT) TABS tablet Take 1 tablet by mouth daily.   Yes Historical Provider, MD  neomycin-bacitracin-polymyxin (NEOSPORIN) [phone] ointment Apply 1 application topically daily. Apply to foot   Yes Historical Provider, MD  polyethylene glycol (MIRALAX / GLYCOLAX) packet Take 17 g by mouth daily. 08/19/14  Yes Reyne Dumas, MD  traMADol (ULTRAM) 50 MG tablet Take 50 mg by mouth 2 (two) times daily as needed for moderate pain (for foot pain). For foot pain 07/26/14  Yes Historical Provider, MD   BP 123/55 mmHg  Pulse 95  Temp(Src) 98.5 F (36.9 C) (Oral)  Resp 16  SpO2 100% Physical Exam  Constitutional: She is oriented to person, place, and time. She appears well-developed and well-nourished.  HENT:  Head: Normocephalic and atraumatic.  Mouth/Throat: Oropharynx is clear and moist.  Eyes: Conjunctivae are normal. Pupils are equal, round, and reactive to light. Right eye exhibits no discharge. Left eye exhibits no discharge. No scleral icterus.  Neck: Neck supple.  Cardiovascular: Normal rate, regular rhythm and normal heart sounds.   Pulmonary/Chest: Effort normal and breath sounds normal. No respiratory distress. She has no wheezes. She has no rales.  Abdominal: Soft. There is no tenderness.  Musculoskeletal: She exhibits no tenderness.  Well-appearing, chronic wound to medial aspect of the left calcaneus. No surrounding cellulitis or drainage noted.  Neurological: She is alert and oriented to person, place, and time.  Cranial Nerves II-XII grossly intact  Skin: Skin is warm and dry. No rash noted.  Psychiatric: She has a normal mood and affect.  Nursing note and vitals reviewed.       ED Course    Procedures (including critical care time) Labs Review Labs Reviewed - No data to display  Imaging Review No results found.   EKG Interpretation None      MDM  Vitals stable - WNL -afebrile Pt resting comfortably in ED. PE--physical exam is not concerning for acute infection.  No evidence of acute infection or other emergent pathology at this time. This is a chronic wound that looks well. Patient is stable to follow-up with her doctors next week for regularly scheduled appointment.  I discussed all relevant lab findings and imaging results with pt and they verbalized understanding. Discussed f/u with PCP within 48 hrs and return precautions, pt very amenable to plan. Prior to patient discharge, I discussed and reviewed this case with Dr. Thomasene Lot, Who also saw and evaluated  the patient.   Final diagnoses:  Wound, open, foot, left, initial encounter        Comer Locket, PA-C 11/20/14 0935  Courteney Lyn Mackuen, MD 11/20/14 1520

## 2014-11-20 NOTE — Discharge Instructions (Signed)
Is important for you to follow-up with your doctors for further evaluation and management of your wound. Your wound does not appear to be infected today. Return to ED for new or worsening symptoms.

## 2014-11-20 NOTE — ED Notes (Signed)
Per pt request, pt was pushed out to waiting room in wheelchair to wait for her friend to pick her up.

## 2014-11-20 NOTE — ED Notes (Signed)
Pt provided with snack and juice while waiting on ride

## 2014-11-21 DIAGNOSIS — Z992 Dependence on renal dialysis: Secondary | ICD-10-CM | POA: Diagnosis not present

## 2014-11-21 DIAGNOSIS — N186 End stage renal disease: Secondary | ICD-10-CM | POA: Diagnosis not present

## 2014-11-21 DIAGNOSIS — E1129 Type 2 diabetes mellitus with other diabetic kidney complication: Secondary | ICD-10-CM | POA: Diagnosis not present

## 2014-11-22 DIAGNOSIS — N2581 Secondary hyperparathyroidism of renal origin: Secondary | ICD-10-CM | POA: Diagnosis not present

## 2014-11-22 DIAGNOSIS — N186 End stage renal disease: Secondary | ICD-10-CM | POA: Diagnosis not present

## 2014-11-22 DIAGNOSIS — E1129 Type 2 diabetes mellitus with other diabetic kidney complication: Secondary | ICD-10-CM | POA: Diagnosis not present

## 2014-11-23 ENCOUNTER — Other Ambulatory Visit: Payer: Self-pay | Admitting: *Deleted

## 2014-11-23 ENCOUNTER — Ambulatory Visit: Payer: Medicare Other | Attending: Internal Medicine | Admitting: Internal Medicine

## 2014-11-23 ENCOUNTER — Encounter: Payer: Self-pay | Admitting: Internal Medicine

## 2014-11-23 VITALS — BP 99/66 | HR 104 | Temp 98.2°F | Resp 18 | Ht 62.0 in | Wt 160.0 lb

## 2014-11-23 DIAGNOSIS — S81802A Unspecified open wound, left lower leg, initial encounter: Secondary | ICD-10-CM | POA: Diagnosis not present

## 2014-11-23 DIAGNOSIS — Z01810 Encounter for preprocedural cardiovascular examination: Secondary | ICD-10-CM

## 2014-11-23 DIAGNOSIS — N186 End stage renal disease: Secondary | ICD-10-CM | POA: Diagnosis not present

## 2014-11-23 DIAGNOSIS — Z48812 Encounter for surgical aftercare following surgery on the circulatory system: Secondary | ICD-10-CM

## 2014-11-23 DIAGNOSIS — L98492 Non-pressure chronic ulcer of skin of other sites with fat layer exposed: Secondary | ICD-10-CM

## 2014-11-23 DIAGNOSIS — Z992 Dependence on renal dialysis: Secondary | ICD-10-CM | POA: Diagnosis not present

## 2014-11-23 DIAGNOSIS — I749 Embolism and thrombosis of unspecified artery: Secondary | ICD-10-CM | POA: Diagnosis not present

## 2014-11-23 DIAGNOSIS — I739 Peripheral vascular disease, unspecified: Secondary | ICD-10-CM

## 2014-11-23 DIAGNOSIS — I7090 Unspecified atherosclerosis: Secondary | ICD-10-CM

## 2014-11-23 DIAGNOSIS — I120 Hypertensive chronic kidney disease with stage 5 chronic kidney disease or end stage renal disease: Secondary | ICD-10-CM | POA: Insufficient documentation

## 2014-11-23 DIAGNOSIS — E1159 Type 2 diabetes mellitus with other circulatory complications: Secondary | ICD-10-CM | POA: Insufficient documentation

## 2014-11-23 DIAGNOSIS — Z9114 Patient's other noncompliance with medication regimen: Secondary | ICD-10-CM | POA: Insufficient documentation

## 2014-11-23 DIAGNOSIS — I10 Essential (primary) hypertension: Secondary | ICD-10-CM

## 2014-11-23 DIAGNOSIS — E1151 Type 2 diabetes mellitus with diabetic peripheral angiopathy without gangrene: Secondary | ICD-10-CM

## 2014-11-23 LAB — GLUCOSE, POCT (MANUAL RESULT ENTRY): POC GLUCOSE: 136 mg/dL — AB (ref 70–99)

## 2014-11-23 LAB — POCT GLYCOSYLATED HEMOGLOBIN (HGB A1C): Hemoglobin A1C: 8.1

## 2014-11-23 MED ORDER — AMLODIPINE BESYLATE 10 MG PO TABS: 10.0000 mg | ORAL_TABLET | Freq: Every day | ORAL | Status: DC

## 2014-11-23 MED ORDER — GLIPIZIDE 10 MG PO TABS: 5.0000 mg | ORAL_TABLET | Freq: Every day | ORAL | Status: DC

## 2014-11-23 NOTE — Patient Instructions (Signed)
Continue to keep area clean. Please complete doppler of lower extremity and f/u with Dr. [name] for ulcer management   Please take your diabetes medication daily to help get your diabetes back controlled

## 2014-11-23 NOTE — Progress Notes (Signed)
Patient here for DM, HTN follow up.  Patient reports pain in left foot, at level 7, described as achy. Patient reports having muscle spasms in left foot at times.   Patient reports having ulcer on side of left ankle, has been on antibiotics.   Patient reports checking sugar every other day. Patient reports sugar is "about like it is today" referring to 136 as the lowest. When asked about the highest value patient responded "up in the hundreds".   Patient has dialysis Monday, Wednesday and Friday. Patient has graft in right leg. Patient reports having to be taken off of dialysis because BP drops.   Patient reports taking Vitamin E and Vitamin C but unsure of dosage.   Patient would like to set up appointment to have pap smear.

## 2014-11-23 NOTE — Progress Notes (Signed)
Patient ID: [name], female   DOB: Dec 17, 1960, 54 y.o.   MRN: [id number]  CC: DM/HTN f/u  HPI: [name] is a 54 y.o. female here today for a follow up visit of DM and HTN.  Patient has past medical history of ESRD on HD, CVA, HTN, T2DM, PVD, GERD, and left transmetatarsal amputation. Patient did not bring a blood sugar log with her today. She notes that her sugars at home have been in the high 100's. She reports that she takes her glipizide 5 mg once every couple of days. She denies hypoglycemic events and has no reason for not taken medication as prescribed. She denies blurred vision, dizziness, polyuria, polydipsia, or neuropathy. She has not been following ADA diet guidelines or been monitoring sodium intake.   Patient also complains of pain in left heel due to open diabetic ulcer. Patient reports that she has had the ulcer for "a while" but recently bumped her foot and noticed the wound opened and became worse. She reports being seen by vascular last week who ordered a ABI and doppler to see if they could save her leg. She reports that she was told she may be at risk for having to have a amputation of that leg. Review of charts reveal that Dr. [name] did a excisional debridement last week as well on the medial left heel.  Patient has No headache, No chest pain, No abdominal pain - No Nausea, No new weakness tingling or numbness, No Cough - SOB.  Allergies  Allergen Reactions  . Doxycycline Itching  . Peroxyl [Hydrogen Peroxide] Hives and Rash   Past Medical History  Diagnosis Date  . ESRD (end stage renal disease)   . Hypertension   . CVA (cerebral infarction)     right parietal 05/19/2000  . Vaginal bleeding   . Hyperparathyroidism   . Pneumonia   . Stroke     2002,no residual  . Peripheral vascular disease   . GERD (gastroesophageal reflux disease)   . Anemia   . DM (diabetes mellitus) type II controlled peripheral vascular disorder   . Slip/trip w/o falling due to  stepping on object, subs July  2014   Current Outpatient Prescriptions on File Prior to Visit  Medication Sig Dispense Refill  . acetaminophen (TYLENOL) 325 MG tablet Take 650 mg by mouth every Monday, Wednesday, and Friday. With dialysis    . amLODipine (NORVASC) 10 MG tablet Take 1 tablet (10 mg total) by mouth at bedtime. 30 tablet 2  . aspirin 81 MG tablet Take 1 tablet (81 mg total) by mouth daily. 90 tablet 3  . CALCIUM-VITAMIN D PO Take 500 mg by mouth 3 (three) times daily with meals.     . Clopidogrel Bisulfate (PLAVIX PO) Take 75 mg by mouth daily.     . Cyanocobalamin (VITAMIN B-12 PO) Take 1 tablet by mouth 4 (four) times a week. Non dialysis days. Tues, Thurs, Sat and Sunday    . diazepam (VALIUM) 5 MG tablet Take 5 mg by mouth every 8 (eight) hours as needed for muscle spasms.   0  . diphenhydrAMINE (BENADRYL) 25 MG tablet Take 25 mg by mouth every 6 (six) hours as needed (Muscle spasm in feet).    Marland Kitchen glipiZIDE (GLUCOTROL) 10 MG tablet Take 0.5 tablets (5 mg total) by mouth daily. 45 tablet 3  . multivitamin (RENA-VIT) TABS tablet Take 1 tablet by mouth daily.    Marland Kitchen neomycin-bacitracin-polymyxin (NEOSPORIN) [phone] ointment Apply 1 application topically daily. Apply to  foot    . polyethylene glycol (MIRALAX / GLYCOLAX) packet Take 17 g by mouth daily. 14 each 0  . traMADol (ULTRAM) 50 MG tablet Take 50 mg by mouth 2 (two) times daily as needed for moderate pain (for foot pain). For foot pain  0   No current facility-administered medications on file prior to visit.   Family History  Problem Relation Age of Onset  . Hypertension Father   . Kidney disease Mother    History   Social History  . Marital Status: Single    Spouse Name: N/A  . Number of Children: N/A  . Years of Education: N/A   Occupational History  . Not on file.   Social History Main Topics  . Smoking status: Never Smoker   . Smokeless tobacco: Never Used  . Alcohol Use: No  . Drug Use: No  . Sexual  Activity: No   Other Topics Concern  . Not on file   Social History Narrative    Review of Systems: Other than what is stated in HPI, all other systems are negative.    Objective:   Filed Vitals:   11/23/14 1451  BP: 99/66  Pulse: 104  Temp: 98.2 F (36.8 C)  Resp: 18    Physical Exam  Constitutional: She is oriented to person, place, and time.  Cardiovascular: Normal rate, regular rhythm and normal heart sounds.   Pulmonary/Chest: Effort normal and breath sounds normal.  Musculoskeletal: She exhibits no edema or tenderness.       Feet:  Large open ulcer  Neurological: She is alert and oriented to person, place, and time.  Skin: Skin is warm and dry. No erythema.  Psychiatric:  Flat affect     Lab Results  Component Value Date   WBC 10.6* 08/21/2014   HGB 11.1* 08/21/2014   HCT 33.9* 08/21/2014   MCV 93.1 08/21/2014   PLT 467* 08/21/2014   Lab Results  Component Value Date   CREATININE 10.45* 08/20/2014   BUN 43* 08/20/2014   NA 133* 08/20/2014   K 3.9 08/20/2014   CL 94* 08/20/2014   CO2 24 08/20/2014    Lab Results  Component Value Date   HGBA1C 8.10 11/23/2014   Lipid Panel     Component Value Date/Time   CHOL 136 10/27/2013 1121   TRIG 74 10/27/2013 1121   HDL 48 10/27/2013 1121   CHOLHDL 2.8 10/27/2013 1121   VLDL 15 10/27/2013 1121   LDLCALC 73 10/27/2013 1121       Assessment and plan:   [name] was seen today for follow-up.  Diagnoses and all orders for this visit:  DM (diabetes mellitus) type II controlled peripheral vascular disorder Orders: -     Glucose (CBG) -     HgB A1c -     glipiZIDE (GLUCOTROL) 10 MG tablet; Take 0.5 tablets (5 mg total) by mouth daily. Patient's A1C has increased from 6.6 to 8.1%. Worsening condition due to only taking medication every other day. I have addressed the need for strict glycemic control especially with non-healing ulcer of left heel. Uncontrolled sugars place patient at higher risk limb  loss and complication of surgery if needed.  Arterial occlusive disease Continue f/u with Vascular and have vascular studies   Non-healing wound of lower extremity, left, initial encounter  Continue f/u with Vascular to see if revascularization is possible. If not addressed the need for diabetes control to prevent more complications if amputation is needed  End stage renal  disease on dialysis Continue HD and avoid all nephrotoxic drugs. Stable  Essential hypertension Orders: -    Refill amLODipine (NORVASC) 10 MG tablet; Take 1 tablet (10 mg total) by mouth at bedtime. Stable, continue current medication. DASH diet  Non compliance w medication regimen See above    Return in about 3 months (around 02/23/2015) for DM/HTN.        Lance Bosch, [facility] [phone] 11/23/2014, 3:18 PM

## 2014-11-24 ENCOUNTER — Ambulatory Visit (HOSPITAL_COMMUNITY)
Admission: RE | Admit: 2014-11-24 | Discharge: 2014-11-24 | Disposition: A | Discharge status: Home / Self Care | Payer: Medicare Other | Source: Ambulatory Visit | Attending: Vascular Surgery | Admitting: Vascular Surgery

## 2014-11-24 ENCOUNTER — Ambulatory Visit (INDEPENDENT_AMBULATORY_CARE_PROVIDER_SITE_OTHER)
Admission: RE | Admit: 2014-11-24 | Discharge: 2014-11-24 | Disposition: A | Discharge status: Home / Self Care | Payer: Medicare Other | Source: Ambulatory Visit | Attending: Vascular Surgery | Admitting: Vascular Surgery

## 2014-11-24 DIAGNOSIS — Z01810 Encounter for preprocedural cardiovascular examination: Secondary | ICD-10-CM

## 2014-11-24 DIAGNOSIS — Z95820 Peripheral vascular angioplasty status with implants and grafts: Secondary | ICD-10-CM | POA: Insufficient documentation

## 2014-11-24 DIAGNOSIS — L98492 Non-pressure chronic ulcer of skin of other sites with fat layer exposed: Secondary | ICD-10-CM

## 2014-11-24 DIAGNOSIS — I739 Peripheral vascular disease, unspecified: Secondary | ICD-10-CM | POA: Diagnosis not present

## 2014-11-24 DIAGNOSIS — I70202 Unspecified atherosclerosis of native arteries of extremities, left leg: Secondary | ICD-10-CM | POA: Insufficient documentation

## 2014-12-15 ENCOUNTER — Ambulatory Visit: Payer: Medicare Other | Admitting: Vascular Surgery

## 2014-12-15 ENCOUNTER — Encounter (HOSPITAL_COMMUNITY): Payer: Medicare Other

## 2014-12-22 DIAGNOSIS — E1129 Type 2 diabetes mellitus with other diabetic kidney complication: Secondary | ICD-10-CM | POA: Diagnosis not present

## 2014-12-22 DIAGNOSIS — N186 End stage renal disease: Secondary | ICD-10-CM | POA: Diagnosis not present

## 2014-12-22 DIAGNOSIS — Z992 Dependence on renal dialysis: Secondary | ICD-10-CM | POA: Diagnosis not present

## 2014-12-24 DIAGNOSIS — N2581 Secondary hyperparathyroidism of renal origin: Secondary | ICD-10-CM | POA: Diagnosis not present

## 2014-12-24 DIAGNOSIS — E1129 Type 2 diabetes mellitus with other diabetic kidney complication: Secondary | ICD-10-CM | POA: Diagnosis not present

## 2014-12-24 DIAGNOSIS — N186 End stage renal disease: Secondary | ICD-10-CM | POA: Diagnosis not present

## 2015-01-07 ENCOUNTER — Other Ambulatory Visit: Payer: Self-pay

## 2015-01-11 ENCOUNTER — Observation Stay (HOSPITAL_COMMUNITY)
Admission: RE | Admit: 2015-01-11 | Discharge: 2015-01-12 | Disposition: A | Discharge status: Home / Self Care | Payer: Medicare Other | Source: Ambulatory Visit | Attending: Surgery | Admitting: Surgery

## 2015-01-11 ENCOUNTER — Encounter (HOSPITAL_COMMUNITY)
Admission: RE | Disposition: A | Discharge status: Home / Self Care | Payer: Self-pay | Source: Ambulatory Visit | Attending: Surgery

## 2015-01-11 DIAGNOSIS — Z8673 Personal history of transient ischemic attack (TIA), and cerebral infarction without residual deficits: Secondary | ICD-10-CM | POA: Diagnosis not present

## 2015-01-11 DIAGNOSIS — Z7902 Long term (current) use of antithrombotics/antiplatelets: Secondary | ICD-10-CM | POA: Diagnosis not present

## 2015-01-11 DIAGNOSIS — E119 Type 2 diabetes mellitus without complications: Secondary | ICD-10-CM | POA: Diagnosis not present

## 2015-01-11 DIAGNOSIS — I120 Hypertensive chronic kidney disease with stage 5 chronic kidney disease or end stage renal disease: Secondary | ICD-10-CM | POA: Insufficient documentation

## 2015-01-11 DIAGNOSIS — I70249 Atherosclerosis of native arteries of left leg with ulceration of unspecified site: Secondary | ICD-10-CM | POA: Diagnosis not present

## 2015-01-11 DIAGNOSIS — L97929 Non-pressure chronic ulcer of unspecified part of left lower leg with unspecified severity: Secondary | ICD-10-CM | POA: Diagnosis not present

## 2015-01-11 DIAGNOSIS — N186 End stage renal disease: Secondary | ICD-10-CM | POA: Insufficient documentation

## 2015-01-11 DIAGNOSIS — Z992 Dependence on renal dialysis: Secondary | ICD-10-CM | POA: Insufficient documentation

## 2015-01-11 DIAGNOSIS — I739 Peripheral vascular disease, unspecified: Secondary | ICD-10-CM | POA: Diagnosis present

## 2015-01-11 DIAGNOSIS — I70244 Atherosclerosis of native arteries of left leg with ulceration of heel and midfoot: Secondary | ICD-10-CM | POA: Diagnosis not present

## 2015-01-11 DIAGNOSIS — Z7982 Long term (current) use of aspirin: Secondary | ICD-10-CM | POA: Diagnosis not present

## 2015-01-11 HISTORY — PX: PERIPHERAL VASCULAR CATHETERIZATION: SHX172C

## 2015-01-11 LAB — POCT I-STAT, CHEM 8
BUN: 36 mg/dL — ABNORMAL HIGH (ref 6–20)
Calcium, Ion: 1.16 mmol/L (ref 1.12–1.23)
Chloride: 97 mmol/L — ABNORMAL LOW (ref 101–111)
Creatinine, Ser: 8.4 mg/dL — ABNORMAL HIGH (ref 0.44–1.00)
Glucose, Bld: 116 mg/dL — ABNORMAL HIGH (ref 65–99)
HCT: 42 % (ref 36.0–46.0)
Hemoglobin: 14.3 g/dL (ref 12.0–15.0)
Potassium: 5 mmol/L (ref 3.5–5.1)
Sodium: 136 mmol/L (ref 135–145)
TCO2: 27 mmol/L (ref 0–100)

## 2015-01-11 LAB — POCT ACTIVATED CLOTTING TIME
Activated Clotting Time: 312 seconds
Activated Clotting Time: 227 seconds

## 2015-01-11 LAB — GLUCOSE, CAPILLARY
GLUCOSE-CAPILLARY: 196 mg/dL — AB (ref 65–99)
GLUCOSE-CAPILLARY: 153 mg/dL — AB (ref 65–99)
Glucose-Capillary: 100 mg/dL — ABNORMAL HIGH (ref 65–99)

## 2015-01-11 SURGERY — LOWER EXTREMITY ANGIOGRAPHY

## 2015-01-11 MED ORDER — DOCUSATE SODIUM 100 MG PO CAPS
100.0000 mg | ORAL_CAPSULE | Freq: Every day | ORAL | Status: DC
  Administered 2015-01-12: 100 mg via ORAL
  Filled 2015-01-11: qty 1

## 2015-01-11 MED ORDER — POLYETHYLENE GLYCOL 3350 17 G PO PACK
17.0000 g | PACK | Freq: Every day | ORAL | Status: DC
  Administered 2015-01-12: 17 g via ORAL
  Filled 2015-01-11: qty 1

## 2015-01-11 MED ORDER — ACETAMINOPHEN 325 MG RE SUPP: 325.0000 mg | RECTAL | Status: DC | PRN

## 2015-01-11 MED ORDER — OXYCODONE HCL 5 MG PO TABS
ORAL_TABLET | ORAL | Status: AC
  Filled 2015-01-11: qty 1

## 2015-01-11 MED ORDER — BACITRACIN-NEOMYCIN-POLYMYXIN OINTMENT TUBE
TOPICAL_OINTMENT | Freq: Every day | CUTANEOUS | Status: DC
  Administered 2015-01-12: 10:00:00 via TOPICAL
  Filled 2015-01-11: qty 15

## 2015-01-11 MED ORDER — HEPARIN SODIUM (PORCINE) 1000 UNIT/ML IJ SOLN
INTRAMUSCULAR | Status: DC | PRN
  Administered 2015-01-11: 7000 [IU] via INTRAVENOUS

## 2015-01-11 MED ORDER — PANTOPRAZOLE SODIUM 40 MG PO TBEC
40.0000 mg | DELAYED_RELEASE_TABLET | Freq: Every day | ORAL | Status: DC
  Filled 2015-01-11 (×2): qty 1

## 2015-01-11 MED ORDER — MIDAZOLAM HCL 2 MG/2ML IJ SOLN
INTRAMUSCULAR | Status: AC
  Filled 2015-01-11: qty 4

## 2015-01-11 MED ORDER — VERAPAMIL HCL 2.5 MG/ML IV SOLN
INTRAVENOUS | Status: AC
  Filled 2015-01-11: qty 2

## 2015-01-11 MED ORDER — DIPHENHYDRAMINE HCL 25 MG PO TABS
25.0000 mg | ORAL_TABLET | Freq: Four times a day (QID) | ORAL | Status: DC | PRN
  Filled 2015-01-11: qty 1

## 2015-01-11 MED ORDER — HYDRALAZINE HCL 20 MG/ML IJ SOLN
5.0000 mg | INTRAMUSCULAR | Status: DC | PRN
  Administered 2015-01-11: 5 mg via INTRAVENOUS

## 2015-01-11 MED ORDER — ONDANSETRON HCL 4 MG/2ML IJ SOLN
4.0000 mg | Freq: Four times a day (QID) | INTRAMUSCULAR | Status: DC | PRN
Start: 2015-01-11 — End: 2015-01-12

## 2015-01-11 MED ORDER — PHENOL 1.4 % MT LIQD: 1.0000 | OROMUCOSAL | Status: DC | PRN

## 2015-01-11 MED ORDER — HEPARIN SODIUM (PORCINE) 1000 UNIT/ML IJ SOLN
INTRAMUSCULAR | Status: AC
  Filled 2015-01-11: qty 1

## 2015-01-11 MED ORDER — MORPHINE SULFATE (PF) 10 MG/ML IV SOLN: 2.0000 mg | INTRAVENOUS | Status: DC | PRN

## 2015-01-11 MED ORDER — ACETAMINOPHEN 325 MG PO TABS: 325.0000 mg | ORAL_TABLET | ORAL | Status: DC | PRN

## 2015-01-11 MED ORDER — TRIPLE ANTIBIOTIC 5-400-5000 EX OINT: 1.0000 "application " | TOPICAL_OINTMENT | Freq: Every day | CUTANEOUS | Status: DC

## 2015-01-11 MED ORDER — IODIXANOL 320 MG/ML IV SOLN
INTRAVENOUS | Status: DC | PRN
  Administered 2015-01-11: 170 mL via INTRAVENOUS

## 2015-01-11 MED ORDER — OXYCODONE HCL 5 MG PO TABS
5.0000 mg | ORAL_TABLET | Freq: Four times a day (QID) | ORAL | Status: DC | PRN
  Administered 2015-01-11 – 2015-01-12 (×3): 5 mg via ORAL
  Filled 2015-01-11 (×3): qty 1

## 2015-01-11 MED ORDER — DIAZEPAM 5 MG PO TABS
5.0000 mg | ORAL_TABLET | Freq: Three times a day (TID) | ORAL | Status: DC | PRN
  Administered 2015-01-11: 5 mg via ORAL
  Filled 2015-01-11 (×2): qty 1

## 2015-01-11 MED ORDER — METOPROLOL TARTRATE 1 MG/ML IV SOLN: 2.0000 mg | INTRAVENOUS | Status: DC | PRN

## 2015-01-11 MED ORDER — FENTANYL CITRATE (PF) 100 MCG/2ML IJ SOLN
INTRAMUSCULAR | Status: AC
  Filled 2015-01-11: qty 4

## 2015-01-11 MED ORDER — HEPARIN (PORCINE) IN NACL 2-0.9 UNIT/ML-% IJ SOLN
INTRAMUSCULAR | Status: AC
  Filled 2015-01-11: qty 1000

## 2015-01-11 MED ORDER — GLIPIZIDE 5 MG PO TABS
5.0000 mg | ORAL_TABLET | Freq: Every day | ORAL | Status: DC
Start: 2015-01-12 — End: 2015-01-12
  Administered 2015-01-12: 5 mg via ORAL
  Filled 2015-01-11: qty 1

## 2015-01-11 MED ORDER — CLOPIDOGREL BISULFATE 75 MG PO TABS
75.0000 mg | ORAL_TABLET | Freq: Every day | ORAL | Status: DC
  Administered 2015-01-12: 75 mg via ORAL
  Filled 2015-01-11: qty 1

## 2015-01-11 MED ORDER — BISACODYL 10 MG RE SUPP: 10.0000 mg | Freq: Every day | RECTAL | Status: DC | PRN

## 2015-01-11 MED ORDER — VIPERSLIDE LUBRICANT OPTIME
TOPICAL | Status: DC | PRN
  Administered 2015-01-11: 12:00:00 via SURGICAL_CAVITY

## 2015-01-11 MED ORDER — MIDAZOLAM HCL 2 MG/2ML IJ SOLN
INTRAMUSCULAR | Status: DC | PRN
  Administered 2015-01-11: 1 mg via INTRAVENOUS
  Administered 2015-01-11: 2 mg via INTRAVENOUS

## 2015-01-11 MED ORDER — ASPIRIN 81 MG PO TABS: 81.0000 mg | ORAL_TABLET | Freq: Every day | ORAL | Status: DC

## 2015-01-11 MED ORDER — ASPIRIN 81 MG PO CHEW
81.0000 mg | CHEWABLE_TABLET | Freq: Every day | ORAL | Status: DC
  Administered 2015-01-12: 81 mg via ORAL
  Filled 2015-01-11: qty 1

## 2015-01-11 MED ORDER — TRAMADOL HCL 50 MG PO TABS
50.0000 mg | ORAL_TABLET | Freq: Two times a day (BID) | ORAL | Status: DC | PRN
  Administered 2015-01-11: 50 mg via ORAL
  Filled 2015-01-11: qty 1

## 2015-01-11 MED ORDER — LABETALOL HCL 5 MG/ML IV SOLN: 10.0000 mg | INTRAVENOUS | Status: DC | PRN

## 2015-01-11 MED ORDER — MORPHINE SULFATE (PF) 2 MG/ML IV SOLN: 2.0000 mg | INTRAVENOUS | Status: DC | PRN

## 2015-01-11 MED ORDER — HYDRALAZINE HCL 20 MG/ML IJ SOLN
INTRAMUSCULAR | Status: AC
  Filled 2015-01-11: qty 1

## 2015-01-11 MED ORDER — FENTANYL CITRATE (PF) 100 MCG/2ML IJ SOLN
INTRAMUSCULAR | Status: DC | PRN
  Administered 2015-01-11 (×3): 50 ug via INTRAVENOUS
  Administered 2015-01-11: 25 ug via INTRAVENOUS

## 2015-01-11 MED ORDER — INSULIN ASPART 100 UNIT/ML ~~LOC~~ SOLN
0.0000 [IU] | Freq: Three times a day (TID) | SUBCUTANEOUS | Status: DC
  Administered 2015-01-11: 2 [IU] via SUBCUTANEOUS

## 2015-01-11 MED ORDER — ALUM & MAG HYDROXIDE-SIMETH 200-200-20 MG/5ML PO SUSP: 15.0000 mL | ORAL | Status: DC | PRN

## 2015-01-11 MED ORDER — GUAIFENESIN-DM 100-10 MG/5ML PO SYRP: 15.0000 mL | ORAL_SOLUTION | ORAL | Status: DC | PRN

## 2015-01-11 MED ORDER — NITROGLYCERIN 1 MG/10 ML FOR IR/CATH LAB
INTRA_ARTERIAL | Status: DC | PRN
  Administered 2015-01-11: 12:00:00

## 2015-01-11 MED ORDER — SODIUM CHLORIDE 0.9 % IJ SOLN: 3.0000 mL | INTRAMUSCULAR | Status: DC | PRN

## 2015-01-11 MED ORDER — AMLODIPINE BESYLATE 10 MG PO TABS
10.0000 mg | ORAL_TABLET | Freq: Every day | ORAL | Status: DC
  Administered 2015-01-11: 10 mg via ORAL
  Filled 2015-01-11: qty 1

## 2015-01-11 MED ORDER — RENA-VITE PO TABS
1.0000 | ORAL_TABLET | Freq: Every day | ORAL | Status: DC
  Administered 2015-01-11: 1 via ORAL
  Filled 2015-01-11: qty 1

## 2015-01-11 MED ORDER — LIDOCAINE HCL (PF) 1 % IJ SOLN
INTRAMUSCULAR | Status: AC
  Filled 2015-01-11: qty 30

## 2015-01-11 MED ORDER — OXYCODONE HCL 5 MG PO TABS
5.0000 mg | ORAL_TABLET | ORAL | Status: DC | PRN
  Administered 2015-01-11: 5 mg via ORAL

## 2015-01-11 SURGICAL SUPPLY — 22 items
BALLN CHOCOLATE 3.0X40X150 (BALLOONS) ×3
BALLN LUTONIX DCB 6X60X130 (BALLOONS) ×3
BALLOON CHOCOLATE 3.0X40X150 (BALLOONS) ×1 IMPLANT
BALLOON LUTONIX DCB 6X60X130 (BALLOONS) IMPLANT
CATH CXI SUPP ST 2.6FR 150CM (MICROCATHETER) ×3 IMPLANT
CATH OMNI FLUSH 5F 65CM (CATHETERS) ×2 IMPLANT
COVER PRB 48X5XTLSCP FOLD TPE (BAG) IMPLANT
COVER PROBE 5X48 (BAG) ×3
CROWN STEALTH MICRO-30 1.25MM (CATHETERS) ×2 IMPLANT
GUIDEWIRE REGALIA .014X300CM (WIRE) ×3 IMPLANT
KIT ENCORE 26 ADVANTAGE (KITS) ×2 IMPLANT
KIT MICROINTRODUCER STIFF 5F (SHEATH) ×2 IMPLANT
KIT PV (KITS) ×3 IMPLANT
SHEATH FLEXOR ANSEL 1 7F 45CM (SHEATH) ×2 IMPLANT
SHEATH PINNACLE 5F 10CM (SHEATH) ×2 IMPLANT
SYR MEDRAD MARK V 150ML (SYRINGE) ×3 IMPLANT
TAPE RADIOPAQUE TURBO (MISCELLANEOUS) ×2 IMPLANT
TRANSDUCER W/STOPCOCK (MISCELLANEOUS) ×3 IMPLANT
TRAY PV CATH (CUSTOM PROCEDURE TRAY) ×3 IMPLANT
WIRE BENTSON .035X145CM (WIRE) ×2 IMPLANT
WIRE SPARTACORE .014X300CM (WIRE) ×4 IMPLANT
WIRE VIPER ADVANCE .017X335CM (WIRE) ×3 IMPLANT

## 2015-01-11 NOTE — Progress Notes (Signed)
ACT at 1315 at was 227

## 2015-01-11 NOTE — Op Note (Signed)
Patient name: [name] MRN: [id number] DOB: [birth year] Sex: female  01/11/2015 Pre-operative Diagnosis: Left leg ulcer Post-operative diagnosis:  Same Surgeon:  Annamarie Major Procedure Performed:  1.  Ultrasound-guided access, right femoral artery  2.  Abdominal aortogram  3.  Left lower extremity runoff  4.  Atherectomy with angioplasty, left anterior tibial artery  5.  Atherectomy with angioplasty, left popliteal artery  6.  Drug coated balloon angioplasty left superficial femoral/popliteal artery    Indications:  The patient has undergone multiple percutaneous interventions both here and in [facility].  She has a nonhealing wound on her foot.  She comes in today to evaluate her previous interventions in an attempt at limb salvage.  Procedure:  The patient was identified in the holding area and taken to room 8.  The patient was then placed supine on the table and prepped and draped in the usual sterile fashion.  A time out was called.  Ultrasound was used to evaluate the right thigh dialysis graft.  It was patent .  A digital ultrasound image was acquired.  A micropuncture needle was used to access the right thigh dialysis graft under ultrasound guidance.  An 018 wire was advanced without resistance and a micropuncture sheath was placed.  The 018 wire was removed and a benson wire was placed.  The micropuncture sheath was exchanged for a 5 french sheath.  An omniflush catheter was advanced over the wire to the level of L-1.  An abdominal angiogram was obtained.  Next, using the omniflush catheter and a benson wire, the aortic bifurcation was crossed and the catheter was placed into theleft external iliac artery and left runoff was obtained.    Findings:   Aortogram:  Diminutive renal arteries bilaterally.  The infrarenal abdominal aorta is widely patent.  Bilateral common and external iliac arteries are widely patent.  Right Lower Extremity:  Not evaluated  Left Lower Extremity:   Common femoral profunda femoral artery are patent.  The superficial femoral artery has approximately 50% stenosis at its origin.  There is mild stenosis in the proximal half, all stenosis of less than 50%.  A stent is visualized within the popliteal artery above the knee.  There is approximately 80% in-stent stenosis.  The popliteal artery is patent down to its bifurcation.  There is a stent within the popliteal, anterior tibial and tibioperoneal trunk.  Within the popliteal artery at the stents there is greater than 70% stenosis.  The dominant runoff is anterior tibial artery which does crossed the ankle.  The posterior tibial artery is occluded.  Intervention:  After the above images were acquired the decision was made to proceed with intervention.  Over a 035 wire, a 7 French 45 cm antral 1 sheath was inserted.  The patient was fully heparinized.  Using a CXI 018 catheter and a 014 wire, the stenoses were all crossed.  The catheter wire were taken down to the dorsalis pedis artery.  At this point a Viper wire was inserted.  I selected a CSI 1.25 migrated device and performed atherectomy of the popliteal artery and proximal anterior tibial artery.  The device continue to shut down when it came into contact with the previously deployed stents.  I therefore elected to remove the device.  I then selected a 3.0 x 40 chocolate balloon and perform balloon angioplasty of the below knee popliteal and anterior tibial artery.  The balloon was taken to nominal pressure for 2 minutes.  Completion imaging revealed resolution of  the stenosis in this area.  On the way out I used the chocolate balloon to dilate the in-stent stenosis, taken the balloon to nominal pressure.  I then removed the chocolate balloon and inserted a 6 x 60 drug coated Lutonix 6 x 60 balloon and perform balloon angioplasty of the in-stent stenosis within the superficial femoral and popliteal artery stents taking the balloon to nominal pressure for 2-1/2  minutes.  Follow-up imaging revealed resolution of the stenosis to less than 10%.  At this point I elected not to push forward with intervention on the more proximal lesions and superficial femoral artery as the follow-up imaging showed good flow to the foot.  At this point catheters and wires were removed.  I used a suture to close the cannulation site of the right thigh dialysis graft.  There was some bleeding after the stitch and therefore manual pressure was held.  Impression:  #1  greater than 80% in-stent stenosis within the left superficial femoral and popliteal artery stents.  This was successfully treated using a drug coated Lutonix X by 60 balloon with resolution of the stenosis to less than 10%  #2  greater than 80% stenosis within the below knee popliteal and anterior tibial artery previously placed stents.  Atherectomy of this area with a CSI 1.25 micro device was performed, followed by balloon angioplasty with a 3.0 x 40 chocolate balloon with resolution of the stenosis to less than 10%  #3  single vessel runoff via the anterior tibial artery   V. Annamarie Major, M.D. [facility] Office: [phone] Pager:  [phone]

## 2015-01-11 NOTE — H&P (Signed)
Expand All Collapse All     [facility]  Patient name: Elizabeth AustinMRN: [id number] DOB: 08-Jul-1962Sex: female  REASON FOR VISIT: Follow up of wound on left heel  HPI: [name] is a 54 y.o. female who I last saw on 09/08/2014. I had been following a wound on her left transmetatarsal amputation site which had healed. She also had a wound on her heel that was improving. It had good granulation tissue and appeared reasonably well perfused.  The kidney center called reporting a large wound on the left medial heel which was draining and for this reason she set up for an office visit. She states that she fell and injured her heel and this is when the wound developed. He has been started on an about it during dialysis treatments. She has a right thigh AV graft which has been working well. She dialyzes on Monday Wednesdays and Fridays.  Of note this patient underwent successful balloon angioplasty of a tight left popliteal artery stenosis by Dr. Trula Slade on 05/27/2012. At that time she had two-vessel runoff via the posterior tibial and anterior tibial arteries. She is not a very good historian, however, I believe that subsequently she has undergone an endovascular procedure in [facility]. In reviewing my notes at one point I was able to review this arteriogram which showed diffuse superficial femoral artery occlusive disease with a dominant runoff being the anterior tibial artery. Apparently in [place] they had tried to stent the posterior tibial artery but this was occluded and I did not think there were further options for revascularization.  I performed her transmetatarsal amputation on 07/15/2012.  A duplex scan at the last visit however showed that she had a stent in the left lower extremity with elevated velocities at the distal aspect of the stent with a peak systolic velocity of 010 cm/s. Given that the wound was improving  in the left leg we decided to hold off on arteriography to evaluate the stenosis in the stent.   Past Medical History  Diagnosis Date  . ESRD (end stage renal disease)   . Hypertension   . CVA (cerebral infarction)     right parietal 05/19/2000  . Vaginal bleeding   . Hyperparathyroidism   . Pneumonia   . Stroke     2002,no residual  . Peripheral vascular disease   . GERD (gastroesophageal reflux disease)   . Anemia   . DM (diabetes mellitus) type II controlled peripheral vascular disorder   . Slip/trip w/o falling due to stepping on object, subs July 2014   Family History  Problem Relation Age of Onset  . Hypertension Father   . Kidney disease Mother    SOCIAL HISTORY: History  Substance Use Topics  . Smoking status: Never Smoker   . Smokeless tobacco: Never Used  . Alcohol Use: Yes     Comment: occasional   Allergies  Allergen Reactions  . Doxycycline Itching  . Peroxyl [Hydrogen Peroxide] Hives and Rash   Current Outpatient Prescriptions  Medication Sig Dispense Refill  . Acetaminophen (TYLENOL PO) Take 325-650 mg by mouth See admin instructions. Uses at dialysis Mon, Wed, Fridays each week for muscle spasms    . amLODipine (NORVASC) 10 MG tablet Take 1 tablet (10 mg total) by mouth at bedtime. 30 tablet 2  . aspirin 81 MG tablet Take 1 tablet (81 mg total) by mouth daily. 90 tablet 3  . CALCIUM-VITAMIN D PO Take 500 mg by mouth 3 (three)  times daily with meals.     . Clopidogrel Bisulfate (PLAVIX PO) Take 75 mg by mouth daily.     . Cyanocobalamin (VITAMIN B-12 PO) Take 1 tablet by mouth 4 (four) times a week. Non dialysis days. Tues, Thurs, Sat and Sunday    . diphenhydrAMINE (BENADRYL) 25 MG tablet Take 25 mg by mouth every 6 (six) hours as needed (Muscle spasm in feet).    Marland Kitchen glipiZIDE (GLUCOTROL) 10 MG tablet Take 0.5 tablets (5 mg total)  by mouth daily. 45 tablet 3  . HYDROcodone-acetaminophen (NORCO/VICODIN) 5-325 MG per tablet as needed.  0  . multivitamin (RENA-VIT) TABS tablet Take 1 tablet by mouth daily.    Marland Kitchen neomycin-bacitracin-polymyxin (NEOSPORIN) [phone] ointment Apply 1 application topically daily. Apply to foot    . polyethylene glycol (MIRALAX / GLYCOLAX) packet Take 17 g by mouth daily. 14 each 0  . traMADol (ULTRAM) 50 MG tablet Take 50 mg by mouth 2 (two) times daily as needed for moderate pain (for foot pain). For foot pain  0   No current facility-administered medications for this visit.   REVIEW OF SYSTEMS: Valu.Nieves ] denotes positive finding; [ ]  denotes negative finding  CARDIOVASCULAR: [ ]  chest pain [ ]  chest pressure [ ]  palpitations [ ]  orthopnea  [ ]  dyspnea on exertion [ ]  claudication [ ]  rest pain [ ]  DVT [ ]  phlebitis PULMONARY: [ ]  productive cough [ ]  asthma [ ]  wheezing NEUROLOGIC: [ ]  weakness [ ]  paresthesias [ ]  aphasia [ ]  amaurosis [ ]  dizziness HEMATOLOGIC: [ ]  bleeding problems [ ]  clotting disorders MUSCULOSKELETAL: [ ]  joint pain [ ]  joint swelling [ ]  leg swelling GASTROINTESTINAL: [ ]  blood in stool [ ]  hematemesis GENITOURINARY: [ ]  dysuria [ ]  hematuria PSYCHIATRIC: [ ]  history of major depression INTEGUMENTARY: [ ]  rashes [ ]  ulcers CONSTITUTIONAL: [ ]  fever [ ]  chills  PHYSICAL EXAM: There were no vitals filed for this visit. GENERAL: The patient is a well-nourished female, in no acute distress. The vital signs are documented above. CARDIAC: There is a regular rate and rhythm.  VASCULAR: she has a right thigh AV graft which has a good bruit and thrill. She has a palpable left femoral pulse. She has monophasic Doppler signals in the left foot. PULMONARY: There is good air exchange bilaterally without wheezing or rales. ABDOMEN: Soft and non-tender with normal pitched bowel sounds.   MUSCULOSKELETAL: she has a transmetatarsal amputation on the left foot.NEUROLOGIC: No focal weakness or paresthesias are detected. SKIN: There is a large wound on the left heel on the medial aspect which measures 35 mm in width and 20 mm in width. I did some excisional debridement of this in the office today. There is currently no significant drainage. PSYCHIATRIC: The patient has a normal affect.  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 She now has an extensive wound on the left heel and this is clearly a limb threatening problem. A previous duplex had suggested some increased velocities at the distal aspect of her stent in the popliteal artery and I think that it is worth restudying her to be sure that there are no further options for revascularization. We would have to cannulate the left side given her graft in the right thigh. If there are no options for revascularization and she will ultimately likely require a BKA. She dialyzes on Monday Wednesdays and  Fridays and she would like to schedule this on a Tuesday or Thursday. We will make further recommendations pending these results.    Deitra Mayo [facility] [place] [facility]: [phone]        Persistent wound

## 2015-01-11 NOTE — Progress Notes (Signed)
     She needs to get to dialysis at 12:35 not 6 am.  Her normal dialysis center 01/12/2015.  Inetha Maret MAUREEN PA-C

## 2015-01-11 NOTE — Discharge Instructions (Signed)

## 2015-01-11 NOTE — Progress Notes (Signed)
Pt rode [employer] bus in for procedure today. Pt does not have family to be with her tonight. Dr Trula Slade states the pt will need to stay the night. Pt has no family. Maureen PA for [facility] called and will arrange for dialysis.

## 2015-01-12 ENCOUNTER — Encounter (HOSPITAL_COMMUNITY): Payer: Self-pay | Admitting: Surgery

## 2015-01-12 ENCOUNTER — Telehealth: Payer: Self-pay | Admitting: Vascular Surgery

## 2015-01-12 DIAGNOSIS — Z8673 Personal history of transient ischemic attack (TIA), and cerebral infarction without residual deficits: Secondary | ICD-10-CM | POA: Diagnosis not present

## 2015-01-12 DIAGNOSIS — L97929 Non-pressure chronic ulcer of unspecified part of left lower leg with unspecified severity: Secondary | ICD-10-CM | POA: Diagnosis not present

## 2015-01-12 DIAGNOSIS — I70249 Atherosclerosis of native arteries of left leg with ulceration of unspecified site: Secondary | ICD-10-CM | POA: Diagnosis not present

## 2015-01-12 DIAGNOSIS — N186 End stage renal disease: Secondary | ICD-10-CM | POA: Diagnosis not present

## 2015-01-12 DIAGNOSIS — E119 Type 2 diabetes mellitus without complications: Secondary | ICD-10-CM | POA: Diagnosis not present

## 2015-01-12 DIAGNOSIS — I120 Hypertensive chronic kidney disease with stage 5 chronic kidney disease or end stage renal disease: Secondary | ICD-10-CM | POA: Diagnosis not present

## 2015-01-12 LAB — GLUCOSE, CAPILLARY
Glucose-Capillary: 120 mg/dL — ABNORMAL HIGH (ref 65–99)
Glucose-Capillary: 127 mg/dL — ABNORMAL HIGH (ref 65–99)

## 2015-01-12 NOTE — Telephone Encounter (Signed)
-----   Message from Mena Goes, RN sent at 01/11/2015  2:55 PM EDT ----- Regarding: schedule   ----- Message -----    From: Serafina Mitchell, MD    Sent: 01/11/2015  12:27 PM      To: [facility] Charge Pool  01/11/2015:  Surgeon:  Annamarie Major Procedure Performed:  1.  Ultrasound-guided access, right femoral artery  2.  Abdominal aortogram  3.  Left lower extremity runoff  4.  Atherectomy with angioplasty, left anterior tibial artery  5.  Atherectomy with angioplasty, left popliteal artery  6.  Drug coated balloon angioplasty left superficial femoral/popliteal artery  Follow-up Dr. [name] in 3 weeks

## 2015-01-12 NOTE — Discharge Summary (Signed)
Discharge Summary    [name] 1961-03-23 54 y.o. female  [id number]  Admission Date: 01/11/2015  Discharge Date: 01/12/15  Physician: Serafina Mitchell, MD  Admission Diagnosis: pad   HPI:   This is a 54 y.o. female who I last saw on 09/08/2014. I had been following a wound on her left transmetatarsal amputation site which had healed. She also had a wound on her heel that was improving. It had good granulation tissue and appeared reasonably well perfused.  The kidney center called reporting a large wound on the left medial heel which was draining and for this reason she set up for an office visit. She states that she fell and injured her heel and this is when the wound developed. He has been started on an about it during dialysis treatments. She has a right thigh AV graft which has been working well. She dialyzes on Monday Wednesdays and Fridays.  Of note this patient underwent successful balloon angioplasty of a tight left popliteal artery stenosis by Dr. Trula Slade on 05/27/2012. At that time she had two-vessel runoff via the posterior tibial and anterior tibial arteries. She is not a very good historian, however, I believe that subsequently she has undergone an endovascular procedure in [facility]. In reviewing my notes at one point I was able to review this arteriogram which showed diffuse superficial femoral artery occlusive disease with a dominant runoff being the anterior tibial artery. Apparently in [place] they had tried to stent the posterior tibial artery but this was occluded and I did not think there were further options for revascularization.  I performed her transmetatarsal amputation on 07/15/2012.  A duplex scan at the last visit however showed that she had a stent in the left lower extremity with elevated velocities at the distal aspect of the stent with a peak systolic velocity of 992 cm/s. Given that the wound was improving in the left leg we decided to  hold off on arteriography to evaluate the stenosis in the stent.   Hospital Course:  The patient was admitted to the hospital and taken to the operating room on 01/11/2015 and underwent: 1. Ultrasound-guided access, right femoral artery 2. Abdominal aortogram 3. Left lower extremity runoff 4. Atherectomy with angioplasty, left anterior tibial artery 5. Atherectomy with angioplasty, left popliteal artery 6. Drug coated balloon angioplasty left superficial femoral/popliteal artery    The pt tolerated the procedure well and was transported to the PACU in good condition.   The pt is discharged home to make her HD appt, which is her normal dialysis day.  She will f/u with Dr. Trula Slade in 3 weeks.  The remainder of the hospital course consisted of increasing mobilization and increasing intake of solids without difficulty.  CBC    Component Value Date/Time   WBC 10.6* 08/21/2014 0430   RBC 3.64* 08/21/2014 0430   HGB 14.3 01/11/2015 0805   HCT 42.0 01/11/2015 0805   PLT 467* 08/21/2014 0430   MCV 93.1 08/21/2014 0430   MCH 30.5 08/21/2014 0430   MCHC 32.7 08/21/2014 0430   RDW 14.5 08/21/2014 0430   LYMPHSABS 1.4 08/16/2014 0112   MONOABS 0.8 08/16/2014 0112   EOSABS 0.6 08/16/2014 0112   BASOSABS 0.1 08/16/2014 0112    BMET    Component Value Date/Time   NA 136 01/11/2015 0805   K 5.0 01/11/2015 0805   CL 97* 01/11/2015 0805   CO2 24 08/20/2014 0805   GLUCOSE 116* 01/11/2015 0805   BUN 36* 01/11/2015  0805   CREATININE 8.40* 01/11/2015 0805   CALCIUM 8.4 08/20/2014 0805   GFRNONAA 4* 08/20/2014 0805   GFRAA 4* 08/20/2014 0805      Discharge Instructions    Call MD for:  redness, tenderness, or signs of infection (pain, swelling, bleeding, redness, odor or green/yellow discharge around incision site)    Complete by:  As directed      Call MD for:  severe or increased pain, loss or decreased  feeling  in affected limb(s)    Complete by:  As directed      Call MD for:  temperature >100.5    Complete by:  As directed      Lifting restrictions    Complete by:  As directed   No lifting for 3 weeks     Resume previous diet    Complete by:  As directed      may wash over wound with mild soap and water    Complete by:  As directed            Discharge Diagnosis:  pad  Secondary Diagnosis: Patient Active Problem List   Diagnosis Date Noted  . PAD (peripheral artery disease) 01/11/2015  . Pleural effusion, right 08/16/2014  . Abdominal pain 08/16/2014  . Chest pain   . Cholecystitis   . Hypoxia   . 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 Ulcer of lower limb, unspecified 09/09/2013  . Pain in limb-left foot 11/19/2012  . End stage renal disease on dialysis   . DM (diabetes mellitus) type II controlled peripheral vascular disorder   . Peripheral vascular disease, unspecified 07/02/2012  . Atherosclerosis of native arteries of the extremities with gangrene 05/21/2012  . Hyperparathyroidism, secondary 12/28/2010   Past Medical History  Diagnosis Date  . ESRD (end stage renal disease)   . Hypertension   . CVA (cerebral infarction)     right parietal 05/19/2000  . Vaginal bleeding   . Hyperparathyroidism   . Pneumonia   . Stroke     2002,no residual  . Peripheral vascular disease   . GERD (gastroesophageal reflux disease)   . Anemia   . DM (diabetes mellitus) type II controlled peripheral vascular disorder   . Slip/trip w/o falling due to stepping on object, subs July  2014       Medication List    TAKE these medications        acetaminophen 325 MG tablet  Commonly known as:  TYLENOL  Take 650 mg by mouth every Monday, Wednesday, and Friday. With dialysis     amLODipine 10 MG tablet  Commonly known as:  NORVASC  Take 1 tablet (10 mg total) by mouth at bedtime.     aspirin 81 MG tablet  Take 1 tablet (81 mg total) by mouth daily.     CALCIUM-VITAMIN D PO  Take  500 mg by mouth 3 (three) times daily with meals.     diazepam 5 MG tablet  Commonly known as:  VALIUM  Take 5 mg by mouth every 8 (eight) hours as needed for muscle spasms.     diphenhydrAMINE 25 MG tablet  Commonly known as:  BENADRYL  Take 25 mg by mouth every 6 (six) hours as needed (Muscle spasm in feet).     glipiZIDE 10 MG tablet  Commonly known as:  GLUCOTROL  Take 0.5 tablets (5 mg total) by mouth daily.     multivitamin Tabs tablet  Take 1 tablet by mouth daily.  neomycin-bacitracin-polymyxin [phone] ointment  Apply 1 application topically daily. Apply to foot     PLAVIX PO  Take 75 mg by mouth daily.     polyethylene glycol packet  Commonly known as:  MIRALAX / GLYCOLAX  Take 17 g by mouth daily.     traMADol 50 MG tablet  Commonly known as:  ULTRAM  Take 50 mg by mouth 2 (two) times daily as needed for moderate pain (for foot pain). For foot pain     VITAMIN B-12 PO  Take 1 tablet by mouth 4 (four) times a week. Non dialysis days. Tues, Thurs, Sat and Sunday     vitamin C 500 MG tablet  Commonly known as:  ASCORBIC ACID  Take 500 mg by mouth daily.     vitamin E 400 UNIT capsule  Take 400 Units by mouth daily.        Prescriptions given: none  Disposition: home  Patient's condition: is Good  Follow up: 1. Dr. Trula Slade in 3 weeks   Leontine Locket, PA-C [facility] [phone] 01/12/2015  9:48 AM

## 2015-01-12 NOTE — Telephone Encounter (Signed)
LM for pt re appt, dpm °

## 2015-01-21 DIAGNOSIS — E1129 Type 2 diabetes mellitus with other diabetic kidney complication: Secondary | ICD-10-CM | POA: Diagnosis not present

## 2015-01-21 DIAGNOSIS — Z992 Dependence on renal dialysis: Secondary | ICD-10-CM | POA: Diagnosis not present

## 2015-01-21 DIAGNOSIS — N186 End stage renal disease: Secondary | ICD-10-CM | POA: Diagnosis not present

## 2015-01-24 ENCOUNTER — Encounter: Payer: Self-pay | Admitting: Vascular Surgery

## 2015-01-24 ENCOUNTER — Other Ambulatory Visit: Payer: Self-pay | Admitting: *Deleted

## 2015-01-24 DIAGNOSIS — E1129 Type 2 diabetes mellitus with other diabetic kidney complication: Secondary | ICD-10-CM | POA: Diagnosis not present

## 2015-01-24 DIAGNOSIS — Z23 Encounter for immunization: Secondary | ICD-10-CM | POA: Diagnosis not present

## 2015-01-24 DIAGNOSIS — D508 Other iron deficiency anemias: Secondary | ICD-10-CM | POA: Diagnosis not present

## 2015-01-24 DIAGNOSIS — D631 Anemia in chronic kidney disease: Secondary | ICD-10-CM | POA: Diagnosis not present

## 2015-01-24 DIAGNOSIS — N2581 Secondary hyperparathyroidism of renal origin: Secondary | ICD-10-CM | POA: Diagnosis not present

## 2015-01-24 DIAGNOSIS — S91301D Unspecified open wound, right foot, subsequent encounter: Secondary | ICD-10-CM | POA: Diagnosis not present

## 2015-01-24 DIAGNOSIS — I739 Peripheral vascular disease, unspecified: Secondary | ICD-10-CM

## 2015-01-24 DIAGNOSIS — D509 Iron deficiency anemia, unspecified: Secondary | ICD-10-CM | POA: Diagnosis not present

## 2015-01-24 DIAGNOSIS — N186 End stage renal disease: Secondary | ICD-10-CM | POA: Diagnosis not present

## 2015-01-26 ENCOUNTER — Encounter: Payer: Medicare Other | Admitting: Vascular Surgery

## 2015-01-26 ENCOUNTER — Encounter (HOSPITAL_COMMUNITY): Payer: Medicare Other

## 2015-01-26 DIAGNOSIS — S91301D Unspecified open wound, right foot, subsequent encounter: Secondary | ICD-10-CM | POA: Diagnosis not present

## 2015-01-26 DIAGNOSIS — D509 Iron deficiency anemia, unspecified: Secondary | ICD-10-CM | POA: Diagnosis not present

## 2015-01-26 DIAGNOSIS — N186 End stage renal disease: Secondary | ICD-10-CM | POA: Diagnosis not present

## 2015-01-26 DIAGNOSIS — E1129 Type 2 diabetes mellitus with other diabetic kidney complication: Secondary | ICD-10-CM | POA: Diagnosis not present

## 2015-01-26 DIAGNOSIS — D508 Other iron deficiency anemias: Secondary | ICD-10-CM | POA: Diagnosis not present

## 2015-01-26 DIAGNOSIS — N2581 Secondary hyperparathyroidism of renal origin: Secondary | ICD-10-CM | POA: Diagnosis not present

## 2015-01-28 DIAGNOSIS — N2581 Secondary hyperparathyroidism of renal origin: Secondary | ICD-10-CM | POA: Diagnosis not present

## 2015-01-28 DIAGNOSIS — D508 Other iron deficiency anemias: Secondary | ICD-10-CM | POA: Diagnosis not present

## 2015-01-28 DIAGNOSIS — N186 End stage renal disease: Secondary | ICD-10-CM | POA: Diagnosis not present

## 2015-01-28 DIAGNOSIS — S91301D Unspecified open wound, right foot, subsequent encounter: Secondary | ICD-10-CM | POA: Diagnosis not present

## 2015-01-28 DIAGNOSIS — D509 Iron deficiency anemia, unspecified: Secondary | ICD-10-CM | POA: Diagnosis not present

## 2015-01-28 DIAGNOSIS — E1129 Type 2 diabetes mellitus with other diabetic kidney complication: Secondary | ICD-10-CM | POA: Diagnosis not present

## 2015-01-31 ENCOUNTER — Telehealth: Payer: Self-pay | Admitting: Internal Medicine

## 2015-01-31 DIAGNOSIS — D509 Iron deficiency anemia, unspecified: Secondary | ICD-10-CM | POA: Diagnosis not present

## 2015-01-31 DIAGNOSIS — E1129 Type 2 diabetes mellitus with other diabetic kidney complication: Secondary | ICD-10-CM | POA: Diagnosis not present

## 2015-01-31 DIAGNOSIS — D508 Other iron deficiency anemias: Secondary | ICD-10-CM | POA: Diagnosis not present

## 2015-01-31 DIAGNOSIS — S91301D Unspecified open wound, right foot, subsequent encounter: Secondary | ICD-10-CM | POA: Diagnosis not present

## 2015-01-31 DIAGNOSIS — N2581 Secondary hyperparathyroidism of renal origin: Secondary | ICD-10-CM | POA: Diagnosis not present

## 2015-01-31 DIAGNOSIS — N186 End stage renal disease: Secondary | ICD-10-CM | POA: Diagnosis not present

## 2015-01-31 NOTE — Telephone Encounter (Signed)
[facility] [employer] is requesting diagnosis codes for Medicare purposes. Please follow up with Lorena, thank you.

## 2015-02-01 ENCOUNTER — Telehealth: Payer: Self-pay

## 2015-02-01 NOTE — Telephone Encounter (Signed)
Returned phone call to [place] from [place] for [employer] stated that they already received a fax with the information they Had requested

## 2015-02-02 ENCOUNTER — Encounter: Payer: Medicare Other | Admitting: Vascular Surgery

## 2015-02-02 ENCOUNTER — Encounter (HOSPITAL_COMMUNITY): Payer: Medicare Other

## 2015-02-02 DIAGNOSIS — D508 Other iron deficiency anemias: Secondary | ICD-10-CM | POA: Diagnosis not present

## 2015-02-02 DIAGNOSIS — N186 End stage renal disease: Secondary | ICD-10-CM | POA: Diagnosis not present

## 2015-02-02 DIAGNOSIS — N2581 Secondary hyperparathyroidism of renal origin: Secondary | ICD-10-CM | POA: Diagnosis not present

## 2015-02-02 DIAGNOSIS — E1129 Type 2 diabetes mellitus with other diabetic kidney complication: Secondary | ICD-10-CM | POA: Diagnosis not present

## 2015-02-02 DIAGNOSIS — S91301D Unspecified open wound, right foot, subsequent encounter: Secondary | ICD-10-CM | POA: Diagnosis not present

## 2015-02-02 DIAGNOSIS — D509 Iron deficiency anemia, unspecified: Secondary | ICD-10-CM | POA: Diagnosis not present

## 2015-02-04 DIAGNOSIS — D509 Iron deficiency anemia, unspecified: Secondary | ICD-10-CM | POA: Diagnosis not present

## 2015-02-04 DIAGNOSIS — S91301D Unspecified open wound, right foot, subsequent encounter: Secondary | ICD-10-CM | POA: Diagnosis not present

## 2015-02-04 DIAGNOSIS — N186 End stage renal disease: Secondary | ICD-10-CM | POA: Diagnosis not present

## 2015-02-04 DIAGNOSIS — N2581 Secondary hyperparathyroidism of renal origin: Secondary | ICD-10-CM | POA: Diagnosis not present

## 2015-02-04 DIAGNOSIS — E1129 Type 2 diabetes mellitus with other diabetic kidney complication: Secondary | ICD-10-CM | POA: Diagnosis not present

## 2015-02-04 DIAGNOSIS — D508 Other iron deficiency anemias: Secondary | ICD-10-CM | POA: Diagnosis not present

## 2015-02-07 DIAGNOSIS — E1129 Type 2 diabetes mellitus with other diabetic kidney complication: Secondary | ICD-10-CM | POA: Diagnosis not present

## 2015-02-07 DIAGNOSIS — S91301D Unspecified open wound, right foot, subsequent encounter: Secondary | ICD-10-CM | POA: Diagnosis not present

## 2015-02-07 DIAGNOSIS — N2581 Secondary hyperparathyroidism of renal origin: Secondary | ICD-10-CM | POA: Diagnosis not present

## 2015-02-07 DIAGNOSIS — N186 End stage renal disease: Secondary | ICD-10-CM | POA: Diagnosis not present

## 2015-02-07 DIAGNOSIS — D509 Iron deficiency anemia, unspecified: Secondary | ICD-10-CM | POA: Diagnosis not present

## 2015-02-07 DIAGNOSIS — D508 Other iron deficiency anemias: Secondary | ICD-10-CM | POA: Diagnosis not present

## 2015-02-09 DIAGNOSIS — S91301D Unspecified open wound, right foot, subsequent encounter: Secondary | ICD-10-CM | POA: Diagnosis not present

## 2015-02-09 DIAGNOSIS — E1129 Type 2 diabetes mellitus with other diabetic kidney complication: Secondary | ICD-10-CM | POA: Diagnosis not present

## 2015-02-09 DIAGNOSIS — D509 Iron deficiency anemia, unspecified: Secondary | ICD-10-CM | POA: Diagnosis not present

## 2015-02-09 DIAGNOSIS — D508 Other iron deficiency anemias: Secondary | ICD-10-CM | POA: Diagnosis not present

## 2015-02-09 DIAGNOSIS — N186 End stage renal disease: Secondary | ICD-10-CM | POA: Diagnosis not present

## 2015-02-09 DIAGNOSIS — N2581 Secondary hyperparathyroidism of renal origin: Secondary | ICD-10-CM | POA: Diagnosis not present

## 2015-02-11 ENCOUNTER — Encounter: Payer: Self-pay | Admitting: Vascular Surgery

## 2015-02-11 DIAGNOSIS — D509 Iron deficiency anemia, unspecified: Secondary | ICD-10-CM | POA: Diagnosis not present

## 2015-02-11 DIAGNOSIS — S91301D Unspecified open wound, right foot, subsequent encounter: Secondary | ICD-10-CM | POA: Diagnosis not present

## 2015-02-11 DIAGNOSIS — N186 End stage renal disease: Secondary | ICD-10-CM | POA: Diagnosis not present

## 2015-02-11 DIAGNOSIS — E1129 Type 2 diabetes mellitus with other diabetic kidney complication: Secondary | ICD-10-CM | POA: Diagnosis not present

## 2015-02-11 DIAGNOSIS — D508 Other iron deficiency anemias: Secondary | ICD-10-CM | POA: Diagnosis not present

## 2015-02-11 DIAGNOSIS — N2581 Secondary hyperparathyroidism of renal origin: Secondary | ICD-10-CM | POA: Diagnosis not present

## 2015-02-14 DIAGNOSIS — N2581 Secondary hyperparathyroidism of renal origin: Secondary | ICD-10-CM | POA: Diagnosis not present

## 2015-02-14 DIAGNOSIS — E1129 Type 2 diabetes mellitus with other diabetic kidney complication: Secondary | ICD-10-CM | POA: Diagnosis not present

## 2015-02-14 DIAGNOSIS — N186 End stage renal disease: Secondary | ICD-10-CM | POA: Diagnosis not present

## 2015-02-14 DIAGNOSIS — D509 Iron deficiency anemia, unspecified: Secondary | ICD-10-CM | POA: Diagnosis not present

## 2015-02-14 DIAGNOSIS — S91301D Unspecified open wound, right foot, subsequent encounter: Secondary | ICD-10-CM | POA: Diagnosis not present

## 2015-02-14 DIAGNOSIS — D508 Other iron deficiency anemias: Secondary | ICD-10-CM | POA: Diagnosis not present

## 2015-02-16 ENCOUNTER — Ambulatory Visit (INDEPENDENT_AMBULATORY_CARE_PROVIDER_SITE_OTHER): Payer: Medicare Other | Admitting: Vascular Surgery

## 2015-02-16 ENCOUNTER — Encounter: Payer: Self-pay | Admitting: Vascular Surgery

## 2015-02-16 ENCOUNTER — Encounter (HOSPITAL_COMMUNITY): Payer: Medicare Other

## 2015-02-16 ENCOUNTER — Ambulatory Visit (HOSPITAL_COMMUNITY)
Admission: RE | Admit: 2015-02-16 | Discharge: 2015-02-16 | Disposition: A | Discharge status: Home / Self Care | Payer: Medicare Other | Source: Ambulatory Visit | Attending: Vascular Surgery | Admitting: Vascular Surgery

## 2015-02-16 VITALS — BP 144/77 | HR 108 | Ht 62.0 in | Wt 155.0 lb

## 2015-02-16 DIAGNOSIS — D508 Other iron deficiency anemias: Secondary | ICD-10-CM | POA: Diagnosis not present

## 2015-02-16 DIAGNOSIS — I739 Peripheral vascular disease, unspecified: Secondary | ICD-10-CM | POA: Insufficient documentation

## 2015-02-16 DIAGNOSIS — S91301D Unspecified open wound, right foot, subsequent encounter: Secondary | ICD-10-CM | POA: Diagnosis not present

## 2015-02-16 DIAGNOSIS — N186 End stage renal disease: Secondary | ICD-10-CM | POA: Diagnosis not present

## 2015-02-16 DIAGNOSIS — E1129 Type 2 diabetes mellitus with other diabetic kidney complication: Secondary | ICD-10-CM | POA: Diagnosis not present

## 2015-02-16 DIAGNOSIS — D509 Iron deficiency anemia, unspecified: Secondary | ICD-10-CM | POA: Diagnosis not present

## 2015-02-16 DIAGNOSIS — S91302D Unspecified open wound, left foot, subsequent encounter: Secondary | ICD-10-CM | POA: Diagnosis not present

## 2015-02-16 DIAGNOSIS — N2581 Secondary hyperparathyroidism of renal origin: Secondary | ICD-10-CM | POA: Diagnosis not present

## 2015-02-16 DIAGNOSIS — I749 Embolism and thrombosis of unspecified artery: Secondary | ICD-10-CM

## 2015-02-16 NOTE — Progress Notes (Signed)
History of Present Illness  [name] is a 54 y.o. female who presents with chief complaint: Left heel ulcer.  She has had a previous left transmetatarsal amputation site which had healed. The heel ulcer was noted at the dialysis center prior to her last visit 11/10/2014.  She is currently waiting to get in at the wound center for weekly treatments.  She has been treating her heel herself at home using triple antibiotic ointment and dry dressings.  The wound was cultured which grew Klebsiella pneumonia, MRSA and Gram negative rods.  She was started on Vancomycin and [employer]. Times 6 treatments.  She reports no fever or chills.    Past Medical History  Diagnosis Date  . ESRD (end stage renal disease) ([place])   . Hypertension   . CVA (cerebral infarction)     right parietal 05/19/2000  . Vaginal bleeding   . Hyperparathyroidism   . Pneumonia   . Stroke ([place])     2002,no residual  . Peripheral vascular disease ([place])   . GERD (gastroesophageal reflux disease)   . Anemia   . DM (diabetes mellitus) type II controlled peripheral vascular disorder ([place])   . Slip/trip w/o falling due to stepping on object, subs July  2014    Past Surgical History  Procedure Laterality Date  . Parathyroidectomy with autotransplant  12/07/2010  . Knee surgery      right, x2  . Brachial artery graft      x2 for dialysis  . Av fistula placement      upper rt thigh  . Dental extractions Bilateral   . Dilation and curettage of uterus      hx: of 1986  . Transmetatarsal amputation Left 07/15/2012    Procedure: TRANSMETATARSAL AMPUTATION;  Surgeon: Angelia Mould, MD;  Location: [facility] OR;  Service: Vascular;  Laterality: Left;  . Leg surgery Right   . Foot surgery Left     5 TOES AMPUTATED  . Abdominal aortagram N/A 05/27/2012    Procedure: ABDOMINAL Maxcine Ham;  Surgeon: Serafina Mitchell, MD;  Location: [facility] CATH LAB;  Service: Cardiovascular;  Laterality: N/A;  . Lower extremity angiogram Left  05/27/2012    Procedure: LOWER EXTREMITY ANGIOGRAM;  Surgeon: Serafina Mitchell, MD;  Location: [facility] CATH LAB;  Service: Cardiovascular;  Laterality: Left;  lt leg angio  . Tooth extraction  Sep 08, 2014  . Peripheral vascular catheterization N/A 01/11/2015    Procedure: Lower Extremity Angiography;  Surgeon: Serafina Mitchell, MD;  Location: [place] CV LAB;  Service: Cardiovascular;  Laterality: N/A;    Social History   Social History  . Marital Status: Single    Spouse Name: N/A  . Number of Children: N/A  . Years of Education: N/A   Occupational History  . Not on file.   Social History Main Topics  . Smoking status: Never Smoker   . Smokeless tobacco: Never Used  . Alcohol Use: No  . Drug Use: No  . Sexual Activity: No   Other Topics Concern  . Not on file   Social History Narrative    Family History  Problem Relation Age of Onset  . Hypertension Father   . Kidney disease Mother     Current Outpatient Prescriptions on File Prior to Visit  Medication Sig Dispense Refill  . acetaminophen (TYLENOL) 325 MG tablet Take 650 mg by mouth every Monday, Wednesday, and Friday. With dialysis    . amLODipine (NORVASC) 10 MG  tablet Take 1 tablet (10 mg total) by mouth at bedtime. 30 tablet 2  . aspirin 81 MG tablet Take 1 tablet (81 mg total) by mouth daily. 90 tablet 3  . CALCIUM-VITAMIN D PO Take 500 mg by mouth 3 (three) times daily with meals.     . Clopidogrel Bisulfate (PLAVIX PO) Take 75 mg by mouth daily.     . Cyanocobalamin (VITAMIN B-12 PO) Take 1 tablet by mouth 4 (four) times a week. Non dialysis days. Tues, Thurs, Sat and Sunday    . diazepam (VALIUM) 5 MG tablet Take 5 mg by mouth every 8 (eight) hours as needed for muscle spasms.   0  . diphenhydrAMINE (BENADRYL) 25 MG tablet Take 25 mg by mouth every 6 (six) hours as needed (Muscle spasm in feet).    Marland Kitchen glipiZIDE (GLUCOTROL) 10 MG tablet Take 0.5 tablets (5 mg total) by mouth daily. 45 tablet 3  . multivitamin  (RENA-VIT) TABS tablet Take 1 tablet by mouth daily.    Marland Kitchen neomycin-bacitracin-polymyxin (NEOSPORIN) [phone] ointment Apply 1 application topically daily. Apply to foot    . polyethylene glycol (MIRALAX / GLYCOLAX) packet Take 17 g by mouth daily. 14 each 0  . traMADol (ULTRAM) 50 MG tablet Take 50 mg by mouth 2 (two) times daily as needed for moderate pain (for foot pain). For foot pain  0  . vitamin C (ASCORBIC ACID) 500 MG tablet Take 500 mg by mouth daily.    . vitamin E 400 UNIT capsule Take 400 Units by mouth daily.     No current facility-administered medications on file prior to visit.    Allergies  Allergen Reactions  . Doxycycline Itching  . Peroxyl [Hydrogen Peroxide] Hives and Rash    Review of Systems: As listed above, otherwise negative.  Physical Examination  Filed Vitals:   02/16/15 1428 02/16/15 1437  BP: 176/85 144/77  Pulse: 108   Height: 5\' 2"  (1.575 m)   Weight: 155 lb (70.308 kg)   SpO2: 100%    Arteriogram with intervention for limb salvage by Dr. Trula Slade 01/11/2015.  Impression: #1 greater than 80% in-stent stenosis within the left superficial femoral and popliteal artery stents. This was successfully treated using a drug coated Lutonix X by 60 balloon with resolution of the stenosis to less than 10% #2 greater than 80% stenosis within the below knee popliteal and anterior tibial artery previously placed stents. Atherectomy of this area with a CSI 1.25 micro device was performed, followed by balloon angioplasty with a 3.0 x 40 chocolate balloon with resolution of the stenosis to less than 10% #3 single vessel runoff via the anterior tibial artery  ABI's 02/16/2015 Right Biphasic flow Left monophasic flow non compressable vessels General: A&O x 3, WDWN  Pulmonary: Sym exp, good air movt, CTAB, no rales, rhonchi, & wheezing  Cardiac: RRR, Nl S1, S2, no Murmurs  Gastrointestinal: soft, NTND  Left heel full  thickness wound with malodor.  No purulent drainage.  Palpable femoral pulse bilateral LE non palpable popliteal, DP/PT.  Procedure: Under clean conditions Dr. [name] debrided the left heel.  The ound has remaining necrotic  tissue and he had to stop the debridement due to pain at the patient's request.  A clean wet to dry dressing was placed over the wound.     Medical Decision Making  [name] is a 54 y.o. female who presents for follow up of her left heel wound.  She  has undergone multiple previous endovascular procedures  on the left. She now has an extensive wound on the left heel and this is clearly a limb threatening problem.  There are no revascularization options.  We will have [place] perform daily wet to dry dressing changes to the left heel.  She is waiting to see the wound center, until then we will see her back in 2-3 weeks for wound evaluation.  If the heel wound does not heal, infection causes her to be septic or the heel pain gets bad gets unbearable we will have to perform BKA verses AKA.     Theda Sers, Aleshia Cartelli MAUREEN PA-C [facility] [place]  She was seen today in conjunction with Dr. [name] 02/16/2015, 3:05 PM  Agree with above. I debrided her heel ulcer in the office today. This is a very extensive one wound and I have explained that this certainly could easily become a limb threatening problem and that she could ultimately require a below-the-knee amputation. She is very much opposed to this. I have reviewed her previous arteriogram and procedures that were done by Dr. Trula Slade. She's had an excellent result from this and I do not see any further options for revascularization in the left leg. We will continue to follow her wound and she can get an appointment in the wound care center but from a vascular standpoint I don't think is anything further to do. I'll plan on seeing her back in 3 weeks. She knows to call sooner she has  problems.  Deitra Mayo, MD, [place] [phone] Office: [phone]

## 2015-02-18 ENCOUNTER — Encounter: Payer: Self-pay | Admitting: Nephrology

## 2015-02-18 ENCOUNTER — Telehealth: Payer: Self-pay | Admitting: *Deleted

## 2015-02-18 DIAGNOSIS — E1129 Type 2 diabetes mellitus with other diabetic kidney complication: Secondary | ICD-10-CM | POA: Diagnosis not present

## 2015-02-18 DIAGNOSIS — D508 Other iron deficiency anemias: Secondary | ICD-10-CM | POA: Diagnosis not present

## 2015-02-18 DIAGNOSIS — N186 End stage renal disease: Secondary | ICD-10-CM | POA: Diagnosis not present

## 2015-02-18 DIAGNOSIS — D509 Iron deficiency anemia, unspecified: Secondary | ICD-10-CM | POA: Diagnosis not present

## 2015-02-18 DIAGNOSIS — S91301D Unspecified open wound, right foot, subsequent encounter: Secondary | ICD-10-CM | POA: Diagnosis not present

## 2015-02-18 DIAGNOSIS — N2581 Secondary hyperparathyroidism of renal origin: Secondary | ICD-10-CM | POA: Diagnosis not present

## 2015-02-18 NOTE — Telephone Encounter (Signed)
I have called patient multiple times to confirm information to schedule home health.  [name] will not be  starting her care until 02/25/15.  I have been able to reach her friend [name].  I explained to her the importance of me talking to the patient as I need to have her come to the office today in order for me to change this dressing for her and to instruct her or a family member how to do this. Also I explained that we will provide her with supplies to last through next week until [place] will begin the care.  [name] is to begin treatment sooner if their schedule permits.  Patient is at [facility] this morning; however,  [name] explained that she would call patient and inform her of this information. Again, I stressed the importance of having this dressing changed today.  [name] voiced understanding of these instructions.

## 2015-02-21 DIAGNOSIS — S91301D Unspecified open wound, right foot, subsequent encounter: Secondary | ICD-10-CM | POA: Diagnosis not present

## 2015-02-21 DIAGNOSIS — N186 End stage renal disease: Secondary | ICD-10-CM | POA: Diagnosis not present

## 2015-02-21 DIAGNOSIS — Z992 Dependence on renal dialysis: Secondary | ICD-10-CM | POA: Diagnosis not present

## 2015-02-21 DIAGNOSIS — N2581 Secondary hyperparathyroidism of renal origin: Secondary | ICD-10-CM | POA: Diagnosis not present

## 2015-02-21 DIAGNOSIS — D508 Other iron deficiency anemias: Secondary | ICD-10-CM | POA: Diagnosis not present

## 2015-02-21 DIAGNOSIS — E1129 Type 2 diabetes mellitus with other diabetic kidney complication: Secondary | ICD-10-CM | POA: Diagnosis not present

## 2015-02-21 DIAGNOSIS — D509 Iron deficiency anemia, unspecified: Secondary | ICD-10-CM | POA: Diagnosis not present

## 2015-02-22 ENCOUNTER — Other Ambulatory Visit: Payer: Self-pay

## 2015-02-22 DIAGNOSIS — E1151 Type 2 diabetes mellitus with diabetic peripheral angiopathy without gangrene: Secondary | ICD-10-CM | POA: Diagnosis not present

## 2015-02-22 DIAGNOSIS — B9562 Methicillin resistant Staphylococcus aureus infection as the cause of diseases classified elsewhere: Secondary | ICD-10-CM | POA: Diagnosis not present

## 2015-02-22 DIAGNOSIS — L97422 Non-pressure chronic ulcer of left heel and midfoot with fat layer exposed: Secondary | ICD-10-CM | POA: Diagnosis not present

## 2015-02-22 DIAGNOSIS — Z1231 Encounter for screening mammogram for malignant neoplasm of breast: Secondary | ICD-10-CM

## 2015-02-22 DIAGNOSIS — B961 Klebsiella pneumoniae [K. pneumoniae] as the cause of diseases classified elsewhere: Secondary | ICD-10-CM | POA: Diagnosis not present

## 2015-02-22 DIAGNOSIS — I120 Hypertensive chronic kidney disease with stage 5 chronic kidney disease or end stage renal disease: Secondary | ICD-10-CM | POA: Diagnosis not present

## 2015-02-22 DIAGNOSIS — E11621 Type 2 diabetes mellitus with foot ulcer: Secondary | ICD-10-CM | POA: Diagnosis not present

## 2015-02-23 DIAGNOSIS — S91301D Unspecified open wound, right foot, subsequent encounter: Secondary | ICD-10-CM | POA: Diagnosis not present

## 2015-02-23 DIAGNOSIS — E1129 Type 2 diabetes mellitus with other diabetic kidney complication: Secondary | ICD-10-CM | POA: Diagnosis not present

## 2015-02-23 DIAGNOSIS — N2581 Secondary hyperparathyroidism of renal origin: Secondary | ICD-10-CM | POA: Diagnosis not present

## 2015-02-23 DIAGNOSIS — D509 Iron deficiency anemia, unspecified: Secondary | ICD-10-CM | POA: Diagnosis not present

## 2015-02-23 DIAGNOSIS — N186 End stage renal disease: Secondary | ICD-10-CM | POA: Diagnosis not present

## 2015-02-24 DIAGNOSIS — L97422 Non-pressure chronic ulcer of left heel and midfoot with fat layer exposed: Secondary | ICD-10-CM | POA: Diagnosis not present

## 2015-02-24 DIAGNOSIS — B9562 Methicillin resistant Staphylococcus aureus infection as the cause of diseases classified elsewhere: Secondary | ICD-10-CM | POA: Diagnosis not present

## 2015-02-24 DIAGNOSIS — B961 Klebsiella pneumoniae [K. pneumoniae] as the cause of diseases classified elsewhere: Secondary | ICD-10-CM | POA: Diagnosis not present

## 2015-02-24 DIAGNOSIS — E1151 Type 2 diabetes mellitus with diabetic peripheral angiopathy without gangrene: Secondary | ICD-10-CM | POA: Diagnosis not present

## 2015-02-24 DIAGNOSIS — I120 Hypertensive chronic kidney disease with stage 5 chronic kidney disease or end stage renal disease: Secondary | ICD-10-CM | POA: Diagnosis not present

## 2015-02-24 DIAGNOSIS — E11621 Type 2 diabetes mellitus with foot ulcer: Secondary | ICD-10-CM | POA: Diagnosis not present

## 2015-02-25 ENCOUNTER — Telehealth: Payer: Self-pay | Admitting: *Deleted

## 2015-02-25 NOTE — Telephone Encounter (Signed)
Brett Canales home health nurse for [name] called asking for order to apply Santyl on soft eschar to heel wound.  I called and left message for her to call me back this afternoon.

## 2015-03-01 DIAGNOSIS — L97422 Non-pressure chronic ulcer of left heel and midfoot with fat layer exposed: Secondary | ICD-10-CM | POA: Diagnosis not present

## 2015-03-01 DIAGNOSIS — E1151 Type 2 diabetes mellitus with diabetic peripheral angiopathy without gangrene: Secondary | ICD-10-CM | POA: Diagnosis not present

## 2015-03-01 DIAGNOSIS — E11621 Type 2 diabetes mellitus with foot ulcer: Secondary | ICD-10-CM | POA: Diagnosis not present

## 2015-03-01 DIAGNOSIS — B961 Klebsiella pneumoniae [K. pneumoniae] as the cause of diseases classified elsewhere: Secondary | ICD-10-CM | POA: Diagnosis not present

## 2015-03-01 DIAGNOSIS — B9562 Methicillin resistant Staphylococcus aureus infection as the cause of diseases classified elsewhere: Secondary | ICD-10-CM | POA: Diagnosis not present

## 2015-03-01 DIAGNOSIS — I120 Hypertensive chronic kidney disease with stage 5 chronic kidney disease or end stage renal disease: Secondary | ICD-10-CM | POA: Diagnosis not present

## 2015-03-03 DIAGNOSIS — L97422 Non-pressure chronic ulcer of left heel and midfoot with fat layer exposed: Secondary | ICD-10-CM | POA: Diagnosis not present

## 2015-03-03 DIAGNOSIS — E11621 Type 2 diabetes mellitus with foot ulcer: Secondary | ICD-10-CM | POA: Diagnosis not present

## 2015-03-03 DIAGNOSIS — B9562 Methicillin resistant Staphylococcus aureus infection as the cause of diseases classified elsewhere: Secondary | ICD-10-CM | POA: Diagnosis not present

## 2015-03-03 DIAGNOSIS — B961 Klebsiella pneumoniae [K. pneumoniae] as the cause of diseases classified elsewhere: Secondary | ICD-10-CM | POA: Diagnosis not present

## 2015-03-03 DIAGNOSIS — E1151 Type 2 diabetes mellitus with diabetic peripheral angiopathy without gangrene: Secondary | ICD-10-CM | POA: Diagnosis not present

## 2015-03-03 DIAGNOSIS — I120 Hypertensive chronic kidney disease with stage 5 chronic kidney disease or end stage renal disease: Secondary | ICD-10-CM | POA: Diagnosis not present

## 2015-03-07 ENCOUNTER — Encounter: Payer: Self-pay | Admitting: Vascular Surgery

## 2015-03-08 ENCOUNTER — Encounter: Payer: Self-pay | Admitting: Vascular Surgery

## 2015-03-08 ENCOUNTER — Ambulatory Visit
Admission: RE | Admit: 2015-03-08 | Discharge: 2015-03-08 | Disposition: A | Discharge status: Home / Self Care | Payer: Medicare Other | Source: Ambulatory Visit

## 2015-03-08 DIAGNOSIS — I120 Hypertensive chronic kidney disease with stage 5 chronic kidney disease or end stage renal disease: Secondary | ICD-10-CM | POA: Diagnosis not present

## 2015-03-08 DIAGNOSIS — Z1231 Encounter for screening mammogram for malignant neoplasm of breast: Secondary | ICD-10-CM

## 2015-03-08 DIAGNOSIS — B9562 Methicillin resistant Staphylococcus aureus infection as the cause of diseases classified elsewhere: Secondary | ICD-10-CM | POA: Diagnosis not present

## 2015-03-08 DIAGNOSIS — E11621 Type 2 diabetes mellitus with foot ulcer: Secondary | ICD-10-CM | POA: Diagnosis not present

## 2015-03-08 DIAGNOSIS — E1151 Type 2 diabetes mellitus with diabetic peripheral angiopathy without gangrene: Secondary | ICD-10-CM | POA: Diagnosis not present

## 2015-03-08 DIAGNOSIS — B961 Klebsiella pneumoniae [K. pneumoniae] as the cause of diseases classified elsewhere: Secondary | ICD-10-CM | POA: Diagnosis not present

## 2015-03-08 DIAGNOSIS — L97422 Non-pressure chronic ulcer of left heel and midfoot with fat layer exposed: Secondary | ICD-10-CM | POA: Diagnosis not present

## 2015-03-09 ENCOUNTER — Ambulatory Visit: Payer: Medicare Other | Admitting: Vascular Surgery

## 2015-03-10 DIAGNOSIS — I120 Hypertensive chronic kidney disease with stage 5 chronic kidney disease or end stage renal disease: Secondary | ICD-10-CM | POA: Diagnosis not present

## 2015-03-10 DIAGNOSIS — L97422 Non-pressure chronic ulcer of left heel and midfoot with fat layer exposed: Secondary | ICD-10-CM | POA: Diagnosis not present

## 2015-03-10 DIAGNOSIS — B9562 Methicillin resistant Staphylococcus aureus infection as the cause of diseases classified elsewhere: Secondary | ICD-10-CM | POA: Diagnosis not present

## 2015-03-10 DIAGNOSIS — B961 Klebsiella pneumoniae [K. pneumoniae] as the cause of diseases classified elsewhere: Secondary | ICD-10-CM | POA: Diagnosis not present

## 2015-03-10 DIAGNOSIS — E11621 Type 2 diabetes mellitus with foot ulcer: Secondary | ICD-10-CM | POA: Diagnosis not present

## 2015-03-10 DIAGNOSIS — E1151 Type 2 diabetes mellitus with diabetic peripheral angiopathy without gangrene: Secondary | ICD-10-CM | POA: Diagnosis not present

## 2015-03-15 DIAGNOSIS — I120 Hypertensive chronic kidney disease with stage 5 chronic kidney disease or end stage renal disease: Secondary | ICD-10-CM | POA: Diagnosis not present

## 2015-03-15 DIAGNOSIS — B9562 Methicillin resistant Staphylococcus aureus infection as the cause of diseases classified elsewhere: Secondary | ICD-10-CM | POA: Diagnosis not present

## 2015-03-15 DIAGNOSIS — B961 Klebsiella pneumoniae [K. pneumoniae] as the cause of diseases classified elsewhere: Secondary | ICD-10-CM | POA: Diagnosis not present

## 2015-03-15 DIAGNOSIS — E1151 Type 2 diabetes mellitus with diabetic peripheral angiopathy without gangrene: Secondary | ICD-10-CM | POA: Diagnosis not present

## 2015-03-15 DIAGNOSIS — E11621 Type 2 diabetes mellitus with foot ulcer: Secondary | ICD-10-CM | POA: Diagnosis not present

## 2015-03-15 DIAGNOSIS — L97422 Non-pressure chronic ulcer of left heel and midfoot with fat layer exposed: Secondary | ICD-10-CM | POA: Diagnosis not present

## 2015-03-21 ENCOUNTER — Encounter: Payer: Self-pay | Admitting: Vascular Surgery

## 2015-03-22 ENCOUNTER — Telehealth: Payer: Self-pay | Admitting: *Deleted

## 2015-03-22 DIAGNOSIS — B9562 Methicillin resistant Staphylococcus aureus infection as the cause of diseases classified elsewhere: Secondary | ICD-10-CM | POA: Diagnosis not present

## 2015-03-22 DIAGNOSIS — L97422 Non-pressure chronic ulcer of left heel and midfoot with fat layer exposed: Secondary | ICD-10-CM | POA: Diagnosis not present

## 2015-03-22 DIAGNOSIS — B961 Klebsiella pneumoniae [K. pneumoniae] as the cause of diseases classified elsewhere: Secondary | ICD-10-CM | POA: Diagnosis not present

## 2015-03-22 DIAGNOSIS — E1151 Type 2 diabetes mellitus with diabetic peripheral angiopathy without gangrene: Secondary | ICD-10-CM | POA: Diagnosis not present

## 2015-03-22 DIAGNOSIS — I120 Hypertensive chronic kidney disease with stage 5 chronic kidney disease or end stage renal disease: Secondary | ICD-10-CM | POA: Diagnosis not present

## 2015-03-22 DIAGNOSIS — E11621 Type 2 diabetes mellitus with foot ulcer: Secondary | ICD-10-CM | POA: Diagnosis not present

## 2015-03-22 NOTE — Telephone Encounter (Signed)
Stacy Gardner home health nurse called stating that patients wound is worsening.  She states that family member is applying an antibiotic  ointment to the wound instead of the santyl.  Jackelyn Poling has advised against this and wanted to alert Dr [name].

## 2015-03-23 ENCOUNTER — Ambulatory Visit (INDEPENDENT_AMBULATORY_CARE_PROVIDER_SITE_OTHER): Payer: Medicare Other | Admitting: Vascular Surgery

## 2015-03-23 ENCOUNTER — Encounter: Payer: Self-pay | Admitting: Vascular Surgery

## 2015-03-23 VITALS — BP 104/69 | HR 101 | Temp 98.2°F | Ht 62.0 in | Wt 151.8 lb

## 2015-03-23 DIAGNOSIS — I749 Embolism and thrombosis of unspecified artery: Secondary | ICD-10-CM | POA: Diagnosis not present

## 2015-03-23 DIAGNOSIS — I70262 Atherosclerosis of native arteries of extremities with gangrene, left leg: Secondary | ICD-10-CM

## 2015-03-23 DIAGNOSIS — Z992 Dependence on renal dialysis: Secondary | ICD-10-CM | POA: Diagnosis not present

## 2015-03-23 DIAGNOSIS — N186 End stage renal disease: Secondary | ICD-10-CM | POA: Diagnosis not present

## 2015-03-23 DIAGNOSIS — E1129 Type 2 diabetes mellitus with other diabetic kidney complication: Secondary | ICD-10-CM | POA: Diagnosis not present

## 2015-03-23 NOTE — Progress Notes (Signed)
[facility]  Patient name: Jutta Mckelvy MRN: [id number] DOB: 03/06/1961 Sex: female  REASON FOR VISIT: follow-up  HPI: Rosealyn Bouck is a 54 y.o. female with an extensive nonhealing wound of her left heel. She previously underwent atherectomy with angioplasty of the left anterior tibial artery and left popliteal artery with drug coated balloon angioplasty to the left superficial femoral/popliteal artery by Dr. Trula Slade on 01/11/15. She had excellent results from this. She was last seen in the office on 02/16/15 where her left heel ulcer was debrided. She has no further revascularization options. The patient was offered a below-knee-amputation but she was very opposed to this. She was advised to follow-up with the wound center and follow-up in three weeks.  She has been performing wet to dry dressings daily with the help of a home aide. Home health nursing has been coming to her home twice a week.   Past Medical History  Diagnosis Date  . ESRD (end stage renal disease) ([place])   . Hypertension   . CVA (cerebral infarction)     right parietal 05/19/2000  . Vaginal bleeding   . Hyperparathyroidism   . Pneumonia   . Stroke ([place])     2002,no residual  . Peripheral vascular disease ([place])   . GERD (gastroesophageal reflux disease)   . Anemia   . DM (diabetes mellitus) type II controlled peripheral vascular disorder ([place])   . Slip/trip w/o falling due to stepping on object, subs July  2014    Family History  Problem Relation Age of Onset  . Hypertension Father   . Kidney disease Mother     SOCIAL HISTORY: Social History  Substance Use Topics  . Smoking status: Never Smoker   . Smokeless tobacco: Never Used  . Alcohol Use: No    Allergies  Allergen Reactions  . Doxycycline Itching  . Peroxyl [Hydrogen Peroxide] Hives and Rash    Current Outpatient Prescriptions  Medication Sig Dispense Refill  . acetaminophen (TYLENOL) 325 MG tablet Take 650 mg  by mouth every Monday, Wednesday, and Friday. With dialysis    . amLODipine (NORVASC) 10 MG tablet Take 1 tablet (10 mg total) by mouth at bedtime. 30 tablet 2  . aspirin 81 MG tablet Take 1 tablet (81 mg total) by mouth daily. 90 tablet 3  . CALCIUM-VITAMIN D PO Take 500 mg by mouth 3 (three) times daily with meals.     . Clopidogrel Bisulfate (PLAVIX PO) Take 75 mg by mouth daily.     . Cyanocobalamin (VITAMIN B-12 PO) Take 1 tablet by mouth 4 (four) times a week. Non dialysis days. Tues, Thurs, Sat and Sunday    . diazepam (VALIUM) 5 MG tablet Take 5 mg by mouth every 8 (eight) hours as needed for muscle spasms.   0  . diphenhydrAMINE (BENADRYL) 25 MG tablet Take 25 mg by mouth every 6 (six) hours as needed (Muscle spasm in feet).    Marland Kitchen glipiZIDE (GLUCOTROL) 10 MG tablet Take 0.5 tablets (5 mg total) by mouth daily. 45 tablet 3  . multivitamin (RENA-VIT) TABS tablet Take 1 tablet by mouth daily.    Marland Kitchen neomycin-bacitracin-polymyxin (NEOSPORIN) [phone] ointment Apply 1 application topically daily. Apply to foot    . polyethylene glycol (MIRALAX / GLYCOLAX) packet Take 17 g by mouth daily. 14 each 0  . traMADol (ULTRAM) 50 MG tablet Take 50 mg by mouth 2 (two) times daily as needed for moderate pain (for foot pain). For foot pain  0  . vitamin C (ASCORBIC ACID) 500 MG tablet Take 500 mg by mouth daily.    . vitamin E 400 UNIT capsule Take 400 Units by mouth daily.     No current facility-administered medications for this visit.    REVIEW OF SYSTEMS:  [X]  denotes positive finding, [ ]  denotes negative finding Cardiac  Comments:  Chest pain or chest pressure:    Shortness of breath upon exertion:    Short of breath when lying flat:    Irregular heart rhythm:        Vascular    Pain in calf, thigh, or hip brought on by ambulation:    Pain in feet at night that wakes you up from your sleep:     Blood clot in your veins:    Leg swelling:         Pulmonary    Oxygen at home:      Productive cough:     Wheezing:         Neurologic    Sudden weakness in arms or legs:     Sudden numbness in arms or legs:     Sudden onset of difficulty speaking or slurred speech:    Temporary loss of vision in one eye:     Problems with dizziness:         Gastrointestinal    Blood in stool:     Vomited blood:         Genitourinary    Burning when urinating:     Blood in urine:        Psychiatric    Major depression:         Hematologic    Bleeding problems:    Problems with blood clotting too easily:        Skin    Rashes or ulcers: x       Constitutional    Fever or chills:      PHYSICAL EXAM: Filed Vitals:   03/23/15 1516  BP: 104/69  Pulse: 101  Temp: 98.2 F (36.8 C)  TempSrc: Oral  Height: 5\' 2"  (1.575 m)  Weight: 151 lb 12.8 oz (68.856 kg)  SpO2: 100%    GENERAL: The patient is a well-nourished female, in no acute distress. The vital signs are documented above. VASCULAR: Left transmetatarsal amputation. Extensive necrotic left heel ulcer with bone exposed. No purulent drainage. Some bleeding with debridement.  PULMONARY: Non labored breathing NEUROLOGIC: No focal weakness or paresthesias are detected. PSYCHIATRIC: The patient has a flat affect.   MEDICAL ISSUES:  Non-healing wound left heel The patient has an extensive left heel ulcer that was debrided today and bone was exposed. Explained that the wound will not likely heal. She is s/p multiple revascularization procedures on her left lower extremity and has no further revascularization options. She was offered below knee amputation at her last office visit on 02/16/15 but declined. Once again, she declines amputation. Also offered the option for surgical debridement and placement of a negative pressure dressing. The patient also declined this option. The patient will follow-up in three weeks for wound evaluation and further discussion. She will continue with daily wet to dry dressings. Will try to  have home health come daily for wound care.   Alvia Grove, PA-C [facility]

## 2015-03-24 DIAGNOSIS — E1151 Type 2 diabetes mellitus with diabetic peripheral angiopathy without gangrene: Secondary | ICD-10-CM | POA: Diagnosis not present

## 2015-03-24 DIAGNOSIS — L97422 Non-pressure chronic ulcer of left heel and midfoot with fat layer exposed: Secondary | ICD-10-CM | POA: Diagnosis not present

## 2015-03-24 DIAGNOSIS — B9562 Methicillin resistant Staphylococcus aureus infection as the cause of diseases classified elsewhere: Secondary | ICD-10-CM | POA: Diagnosis not present

## 2015-03-24 DIAGNOSIS — I120 Hypertensive chronic kidney disease with stage 5 chronic kidney disease or end stage renal disease: Secondary | ICD-10-CM | POA: Diagnosis not present

## 2015-03-24 DIAGNOSIS — B961 Klebsiella pneumoniae [K. pneumoniae] as the cause of diseases classified elsewhere: Secondary | ICD-10-CM | POA: Diagnosis not present

## 2015-03-24 DIAGNOSIS — E11621 Type 2 diabetes mellitus with foot ulcer: Secondary | ICD-10-CM | POA: Diagnosis not present

## 2015-03-25 ENCOUNTER — Encounter (HOSPITAL_COMMUNITY): Payer: Self-pay | Admitting: Emergency Medicine

## 2015-03-25 ENCOUNTER — Inpatient Hospital Stay (HOSPITAL_COMMUNITY)
Admission: EM | Admit: 2015-03-25 | Discharge: 2015-03-27 | DRG: 308 | Disposition: A | Discharge status: Home / Self Care | Payer: Medicare Other | Attending: Internal Medicine | Admitting: Internal Medicine

## 2015-03-25 ENCOUNTER — Emergency Department (HOSPITAL_COMMUNITY): Payer: Medicare Other

## 2015-03-25 DIAGNOSIS — E11621 Type 2 diabetes mellitus with foot ulcer: Secondary | ICD-10-CM | POA: Diagnosis present

## 2015-03-25 DIAGNOSIS — L97922 Non-pressure chronic ulcer of unspecified part of left lower leg with fat layer exposed: Secondary | ICD-10-CM | POA: Diagnosis not present

## 2015-03-25 DIAGNOSIS — I120 Hypertensive chronic kidney disease with stage 5 chronic kidney disease or end stage renal disease: Secondary | ICD-10-CM | POA: Diagnosis present

## 2015-03-25 DIAGNOSIS — I739 Peripheral vascular disease, unspecified: Secondary | ICD-10-CM

## 2015-03-25 DIAGNOSIS — I480 Paroxysmal atrial fibrillation: Principal | ICD-10-CM | POA: Diagnosis present

## 2015-03-25 DIAGNOSIS — D638 Anemia in other chronic diseases classified elsewhere: Secondary | ICD-10-CM | POA: Diagnosis present

## 2015-03-25 DIAGNOSIS — Z992 Dependence on renal dialysis: Secondary | ICD-10-CM | POA: Diagnosis not present

## 2015-03-25 DIAGNOSIS — Z8673 Personal history of transient ischemic attack (TIA), and cerebral infarction without residual deficits: Secondary | ICD-10-CM | POA: Diagnosis not present

## 2015-03-25 DIAGNOSIS — E1151 Type 2 diabetes mellitus with diabetic peripheral angiopathy without gangrene: Secondary | ICD-10-CM

## 2015-03-25 DIAGNOSIS — L97429 Non-pressure chronic ulcer of left heel and midfoot with unspecified severity: Secondary | ICD-10-CM | POA: Diagnosis present

## 2015-03-25 DIAGNOSIS — E1129 Type 2 diabetes mellitus with other diabetic kidney complication: Secondary | ICD-10-CM | POA: Diagnosis not present

## 2015-03-25 DIAGNOSIS — N186 End stage renal disease: Secondary | ICD-10-CM | POA: Diagnosis not present

## 2015-03-25 DIAGNOSIS — Z794 Long term (current) use of insulin: Secondary | ICD-10-CM | POA: Diagnosis not present

## 2015-03-25 DIAGNOSIS — Z7902 Long term (current) use of antithrombotics/antiplatelets: Secondary | ICD-10-CM

## 2015-03-25 DIAGNOSIS — E1122 Type 2 diabetes mellitus with diabetic chronic kidney disease: Secondary | ICD-10-CM | POA: Diagnosis not present

## 2015-03-25 DIAGNOSIS — R0602 Shortness of breath: Secondary | ICD-10-CM | POA: Diagnosis not present

## 2015-03-25 DIAGNOSIS — D631 Anemia in chronic kidney disease: Secondary | ICD-10-CM | POA: Diagnosis not present

## 2015-03-25 DIAGNOSIS — I4891 Unspecified atrial fibrillation: Secondary | ICD-10-CM | POA: Diagnosis not present

## 2015-03-25 DIAGNOSIS — N2581 Secondary hyperparathyroidism of renal origin: Secondary | ICD-10-CM | POA: Diagnosis present

## 2015-03-25 DIAGNOSIS — R072 Precordial pain: Secondary | ICD-10-CM | POA: Diagnosis not present

## 2015-03-25 DIAGNOSIS — R079 Chest pain, unspecified: Secondary | ICD-10-CM | POA: Diagnosis not present

## 2015-03-25 DIAGNOSIS — Z7982 Long term (current) use of aspirin: Secondary | ICD-10-CM | POA: Diagnosis not present

## 2015-03-25 DIAGNOSIS — L97909 Non-pressure chronic ulcer of unspecified part of unspecified lower leg with unspecified severity: Secondary | ICD-10-CM | POA: Diagnosis present

## 2015-03-25 LAB — CBC WITH DIFFERENTIAL/PLATELET
BASOS ABS: 0 10*3/uL (ref 0.0–0.1)
Basophils Relative: 0 %
EOS PCT: 1 %
Eosinophils Absolute: 0.1 10*3/uL (ref 0.0–0.7)
HEMATOCRIT: 42.7 % (ref 36.0–46.0)
Hemoglobin: 13.9 g/dL (ref 12.0–15.0)
LYMPHS ABS: 1.5 10*3/uL (ref 0.7–4.0)
LYMPHS PCT: 15 %
MCH: 30.4 pg (ref 26.0–34.0)
MCHC: 32.6 g/dL (ref 30.0–36.0)
MCV: 93.4 fL (ref 78.0–100.0)
MONO ABS: 0.8 10*3/uL (ref 0.1–1.0)
Monocytes Relative: 8 %
NEUTROS ABS: 7.6 10*3/uL (ref 1.7–7.7)
Neutrophils Relative %: 76 %
Platelets: 197 10*3/uL (ref 150–400)
RBC: 4.57 MIL/uL (ref 3.87–5.11)
RDW: 15.3 % (ref 11.5–15.5)
WBC: 10 10*3/uL (ref 4.0–10.5)

## 2015-03-25 LAB — TROPONIN I
Troponin I: <0.03 ng/mL (ref <0.031)
Troponin I: 0.04 ng/mL — ABNORMAL HIGH
Troponin I: 0.04 ng/mL — ABNORMAL HIGH

## 2015-03-25 LAB — BASIC METABOLIC PANEL
ANION GAP: 13 (ref 5–15)
BUN: 26 mg/dL — AB (ref 6–20)
CHLORIDE: 94 mmol/L — AB (ref 101–111)
CO2: 30 mmol/L (ref 22–32)
Calcium: 9.2 mg/dL (ref 8.9–10.3)
Creatinine, Ser: 7.14 mg/dL — ABNORMAL HIGH (ref 0.44–1.00)
GFR calc Af Amer: 7 mL/min — ABNORMAL LOW (ref >60)
GFR, EST NON AFRICAN AMERICAN: 6 mL/min — AB (ref >60)
GLUCOSE: 160 mg/dL — AB (ref 65–99)
POTASSIUM: 4.5 mmol/L (ref 3.5–5.1)
Sodium: 137 mmol/L (ref 135–145)

## 2015-03-25 LAB — GLUCOSE, CAPILLARY
GLUCOSE-CAPILLARY: 301 mg/dL — AB (ref 65–99)
Glucose-Capillary: 145 mg/dL — ABNORMAL HIGH (ref 65–99)

## 2015-03-25 LAB — TSH: TSH: 0.298 u[IU]/mL — AB (ref 0.350–4.500)

## 2015-03-25 LAB — MRSA PCR SCREENING: MRSA BY PCR: POSITIVE — AB

## 2015-03-25 LAB — T4, FREE: Free T4: 0.96 ng/dL (ref 0.61–1.12)

## 2015-03-25 MED ORDER — SODIUM CHLORIDE 0.9 % IV BOLUS (SEPSIS): 250.0000 mL | Freq: Once | INTRAVENOUS | Status: DC

## 2015-03-25 MED ORDER — INSULIN ASPART 100 UNIT/ML ~~LOC~~ SOLN
0.0000 [IU] | Freq: Three times a day (TID) | SUBCUTANEOUS | Status: DC
  Administered 2015-03-25: 7 [IU] via SUBCUTANEOUS
  Administered 2015-03-26: 3 [IU] via SUBCUTANEOUS
  Administered 2015-03-27: 1 [IU] via SUBCUTANEOUS
  Administered 2015-03-27: 2 [IU] via SUBCUTANEOUS

## 2015-03-25 MED ORDER — DILTIAZEM HCL ER COATED BEADS 120 MG PO CP24
120.0000 mg | ORAL_CAPSULE | Freq: Every day | ORAL | Status: DC
Start: 2015-03-25 — End: 2015-03-27
  Administered 2015-03-25 – 2015-03-27 (×3): 120 mg via ORAL
  Filled 2015-03-25 (×4): qty 1

## 2015-03-25 MED ORDER — ASPIRIN 81 MG PO TABS: 81.0000 mg | ORAL_TABLET | Freq: Every day | ORAL | Status: DC

## 2015-03-25 MED ORDER — DILTIAZEM HCL 30 MG PO TABS: 30.0000 mg | ORAL_TABLET | Freq: Three times a day (TID) | ORAL | Status: DC | PRN

## 2015-03-25 MED ORDER — POLYETHYLENE GLYCOL 3350 17 G PO PACK
17.0000 g | PACK | Freq: Every day | ORAL | Status: DC
  Administered 2015-03-27: 17 g via ORAL
  Filled 2015-03-25 (×3): qty 1

## 2015-03-25 MED ORDER — DIAZEPAM 5 MG PO TABS: 5.0000 mg | ORAL_TABLET | Freq: Three times a day (TID) | ORAL | Status: DC | PRN

## 2015-03-25 MED ORDER — ACETAMINOPHEN 325 MG PO TABS
650.0000 mg | ORAL_TABLET | ORAL | Status: DC | PRN
  Administered 2015-03-26: 650 mg via ORAL
  Filled 2015-03-25: qty 2

## 2015-03-25 MED ORDER — VITAMIN C 500 MG PO TABS
500.0000 mg | ORAL_TABLET | Freq: Every day | ORAL | Status: DC
  Administered 2015-03-25 – 2015-03-27 (×3): 500 mg via ORAL
  Filled 2015-03-25 (×4): qty 1

## 2015-03-25 MED ORDER — HEPARIN BOLUS VIA INFUSION
4000.0000 [IU] | Freq: Once | INTRAVENOUS | Status: AC
  Administered 2015-03-25: 4000 [IU] via INTRAVENOUS
  Filled 2015-03-25: qty 4000

## 2015-03-25 MED ORDER — WARFARIN SODIUM 7.5 MG PO TABS
7.5000 mg | ORAL_TABLET | Freq: Once | ORAL | Status: AC
  Administered 2015-03-25: 7.5 mg via ORAL
  Filled 2015-03-25 (×2): qty 1

## 2015-03-25 MED ORDER — ACETAMINOPHEN 325 MG PO TABS
650.0000 mg | ORAL_TABLET | ORAL | Status: DC
  Administered 2015-03-25: 650 mg via ORAL
  Filled 2015-03-25: qty 2

## 2015-03-25 MED ORDER — TRIPLE ANTIBIOTIC 5-400-5000 EX OINT: 1.0000 "application " | TOPICAL_OINTMENT | Freq: Every day | CUTANEOUS | Status: DC

## 2015-03-25 MED ORDER — RENA-VITE PO TABS
1.0000 | ORAL_TABLET | Freq: Every day | ORAL | Status: DC
  Administered 2015-03-25 – 2015-03-26 (×2): 1 via ORAL
  Filled 2015-03-25 (×2): qty 1

## 2015-03-25 MED ORDER — ASPIRIN 81 MG PO CHEW
81.0000 mg | CHEWABLE_TABLET | Freq: Every day | ORAL | Status: DC
  Administered 2015-03-26: 81 mg via ORAL
  Filled 2015-03-25: qty 1

## 2015-03-25 MED ORDER — VITAMIN E 180 MG (400 UNIT) PO CAPS
400.0000 [IU] | ORAL_CAPSULE | Freq: Every day | ORAL | Status: DC
  Administered 2015-03-25 – 2015-03-27 (×3): 400 [IU] via ORAL
  Filled 2015-03-25 (×4): qty 1

## 2015-03-25 MED ORDER — CALCIUM CARBONATE ANTACID 500 MG PO CHEW
1.0000 | CHEWABLE_TABLET | Freq: Three times a day (TID) | ORAL | Status: DC
  Administered 2015-03-25 – 2015-03-27 (×5): 200 mg via ORAL
  Filled 2015-03-25 (×6): qty 1

## 2015-03-25 MED ORDER — TRAMADOL HCL 50 MG PO TABS
50.0000 mg | ORAL_TABLET | Freq: Two times a day (BID) | ORAL | Status: DC | PRN
  Administered 2015-03-26 – 2015-03-27 (×2): 50 mg via ORAL
  Filled 2015-03-25 (×2): qty 1

## 2015-03-25 MED ORDER — HEPARIN (PORCINE) IN NACL 100-0.45 UNIT/ML-% IJ SOLN
1050.0000 [IU]/h | INTRAMUSCULAR | Status: DC
  Administered 2015-03-25: 750 [IU]/h via INTRAVENOUS
  Administered 2015-03-26: 900 [IU]/h via INTRAVENOUS
  Filled 2015-03-25 (×2): qty 250

## 2015-03-25 MED ORDER — DILTIAZEM HCL 100 MG IV SOLR
5.0000 mg/h | Freq: Once | INTRAVENOUS | Status: AC
  Administered 2015-03-25: 5 mg/h via INTRAVENOUS
  Filled 2015-03-25: qty 100

## 2015-03-25 MED ORDER — CLOPIDOGREL BISULFATE 75 MG PO TABS
75.0000 mg | ORAL_TABLET | Freq: Every day | ORAL | Status: DC
  Administered 2015-03-25 – 2015-03-27 (×3): 75 mg via ORAL
  Filled 2015-03-25 (×3): qty 1

## 2015-03-25 MED ORDER — BACITRACIN-NEOMYCIN-POLYMYXIN OINTMENT TUBE
TOPICAL_OINTMENT | Freq: Every day | CUTANEOUS | Status: DC
  Administered 2015-03-25 – 2015-03-27 (×3): via TOPICAL
  Filled 2015-03-25: qty 15

## 2015-03-25 MED ORDER — VITAMIN B-12 100 MCG PO TABS
50.0000 ug | ORAL_TABLET | ORAL | Status: DC
  Administered 2015-03-26 – 2015-03-27 (×2): 50 ug via ORAL
  Filled 2015-03-25 (×3): qty 1

## 2015-03-25 MED ORDER — CALCIUM-VITAMIN D 500 MG PO WAFR: WAFER | Freq: Three times a day (TID) | ORAL | Status: DC

## 2015-03-25 MED ORDER — ONDANSETRON HCL 4 MG/2ML IJ SOLN: 4.0000 mg | Freq: Four times a day (QID) | INTRAMUSCULAR | Status: DC | PRN

## 2015-03-25 MED ORDER — DIPHENHYDRAMINE HCL 25 MG PO TABS
25.0000 mg | ORAL_TABLET | Freq: Four times a day (QID) | ORAL | Status: DC | PRN
  Filled 2015-03-25: qty 1

## 2015-03-25 MED ORDER — WARFARIN - PHYSICIAN DOSING INPATIENT: Freq: Every day | Status: DC

## 2015-03-25 NOTE — ED Notes (Signed)
Pt to xray without distress.  

## 2015-03-25 NOTE — ED Notes (Signed)
Renal MD at bedside.

## 2015-03-25 NOTE — ED Notes (Signed)
PT was halfway through dialysis and felt chest pain about 8:30. Reports dialysis had heart rate in 120s then went to 140's for EMS. 110/66 on arrival and BP now [id number] systolic; chest pain decreased with oxygen. Asprin given. 7/10 chest pain.

## 2015-03-25 NOTE — ED Notes (Signed)
MD cardiologist at bedside at bedside.

## 2015-03-25 NOTE — ED Notes (Signed)
Heart healthy tray ordered 

## 2015-03-25 NOTE — ED Notes (Signed)
Phlebotomy at bedside. Patient reports chest pain better and not having any symptoms.

## 2015-03-25 NOTE — H&P (Signed)
[facility] History and Physical                                                                                    [name], is a 54 y.o. female  MRN: [id number]   DOB - [birth year]  Admit Date - 03/25/2015  Outpatient Primary MD for the patient is Lance Bosch, NP  Referring Physician:  Dr. Stark Jock  Chief Complaint:   Chief Complaint  Patient presents with  . Chest Pain     HPI  [name]  is a 54 y.o. female, with ESRD (HD M/W/F), PVD, HTN, HLD, DM, and a non healing wound on her left heal.  She was feeling in her usual state of health this morning other than a sore throat for the last couple days. About one hour into dialysis she developed chest pain and palpitations and was transported to the emergency department. Her chest pain felt like an aching pressure in the center of her chest that lasted about an hour and was relieved with medications here in the ER. In the ER the patient was found to be in atrial fibrillation with RVR. She has never had this diagnosis before.  The patient denies any recent medication changes. She denies any changes in her dry weight - she states her dry weight is 67.5 kg. She denies any difficulty breathing or dyspnea on exertion. Labs are relatively reassuring her troponin is less than 0.03. Her white count is 10.  The patient does have a nonhealing wound on her left heel being treated as an outpatient by the vascular surgeons. The heel wound was debrided on Monday. Amputation has been suggested to the patient multiple times but thus far she has refused.   Review of Systems  Constitutional: Negative for weight loss.  HENT: Positive for sore throat. Negative for congestion.   Eyes: Negative.   Respiratory: Negative.   Cardiovascular: Positive for chest pain, palpitations and leg swelling.  Gastrointestinal: Negative for nausea, vomiting and abdominal pain.  Genitourinary: Negative.   Musculoskeletal: Negative.   Skin: Negative.    Neurological: Negative.   Endo/Heme/Allergies: Negative.   Psychiatric/Behavioral: Negative.      Past Medical History  Past Medical History  Diagnosis Date  . ESRD (end stage renal disease) ([place])   . Hypertension   . CVA (cerebral infarction)     right parietal 05/19/2000  . Vaginal bleeding   . Hyperparathyroidism   . Pneumonia   . Stroke ([place])     2002,no residual  . Peripheral vascular disease ([place])   . GERD (gastroesophageal reflux disease)   . Anemia   . DM (diabetes mellitus) type II controlled peripheral vascular disorder ([place])   . Slip/trip w/o falling due to stepping on object, subs July  2014    Past Surgical History  Procedure Laterality Date  . Parathyroidectomy with autotransplant  12/07/2010  . Knee surgery      right, x2  . Brachial artery graft      x2 for dialysis  . Av fistula placement      upper rt thigh  . Dental extractions Bilateral   . Dilation and curettage of uterus  hx: of 1986  . 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 Leg surgery Right   . Foot surgery Left     5 TOES AMPUTATED  . Abdominal aortagram N/A 05/27/2012    Procedure: ABDOMINAL Maxcine Ham;  Surgeon: Serafina Mitchell, MD;  Location: [facility] CATH LAB;  Service: Cardiovascular;  Laterality: N/A;  . Lower extremity angiogram Left 05/27/2012    Procedure: LOWER EXTREMITY ANGIOGRAM;  Surgeon: Serafina Mitchell, MD;  Location: [facility] CATH LAB;  Service: Cardiovascular;  Laterality: Left;  lt leg angio  . Tooth extraction  Sep 08, 2014  . Peripheral vascular catheterization N/A 01/11/2015    Procedure: Lower Extremity Angiography;  Surgeon: Serafina Mitchell, MD;  Location: [place] CV LAB;  Service: Cardiovascular;  Laterality: N/A;      Social History Social History  Substance Use Topics  . Smoking status: Never Smoker   . Smokeless tobacco: Never  Used  . Alcohol Use: No   lives in an apartment. Has an aide for assistance  Family History Family History  Problem Relation Age of Onset  . Hypertension Father   . Kidney disease Mother    denies history of cardiac disease in her family.  Prior to Admission medications   Medication Sig Start Date End Date Taking? Authorizing Provider  acetaminophen (TYLENOL) 325 MG tablet Take 650 mg by mouth every Monday, Wednesday, and Friday. With dialysis   Yes Historical Provider, MD  amLODipine (NORVASC) 10 MG tablet Take 1 tablet (10 mg total) by mouth at bedtime. 11/23/14  Yes Lance Bosch, NP  aspirin 81 MG tablet Take 1 tablet (81 mg total) by mouth daily. 01/19/14  Yes Lance Bosch, NP  CALCIUM-VITAMIN D PO Take 500 mg by mouth 3 (three) times daily with meals.    Yes Historical Provider, MD  Clopidogrel Bisulfate (PLAVIX PO) Take 75 mg by mouth daily.    Yes Historical Provider, MD  Cyanocobalamin (VITAMIN B-12 PO) Take 1 tablet by mouth 4 (four) times a week. Non dialysis days. Cain Saupe, Sat and Sunday   Yes Historical Provider, MD  diazepam (VALIUM) 5 MG tablet Take 5 mg by mouth every 8 (eight) hours as needed for muscle spasms.  11/15/14  Yes Historical Provider, MD  diphenhydrAMINE (BENADRYL) 25 MG tablet Take 25 mg by mouth every 6 (six) hours as needed (Muscle spasm in feet).   Yes Historical Provider, MD  glipiZIDE (GLUCOTROL) 10 MG tablet Take 0.5 tablets (5 mg total) by mouth daily. 11/23/14  Yes Lance Bosch, NP  multivitamin (RENA-VIT) TABS tablet Take 1 tablet by mouth daily.   Yes Historical Provider, MD  neomycin-bacitracin-polymyxin (NEOSPORIN) [phone] ointment Apply 1 application topically daily. Apply to foot   Yes Historical Provider, MD  traMADol (ULTRAM) 50 MG tablet Take 50 mg by mouth 2 (two) times daily as needed for moderate pain (for foot pain). For foot pain 07/26/14  Yes Historical Provider, MD  vitamin C (ASCORBIC ACID) 500 MG tablet Take 500 mg by mouth daily.    Yes Historical Provider, MD  vitamin E 400 UNIT capsule Take 400 Units by mouth daily.   Yes Historical Provider, MD  polyethylene glycol (MIRALAX / GLYCOLAX) packet Take 17 g by mouth daily. Patient taking differently: Take 17 g by mouth daily as needed for mild constipation.  08/19/14   Reyne Dumas, MD  Allergies  Allergen Reactions  . Doxycycline Itching  . Peroxyl [Hydrogen Peroxide] Hives and Rash    Physical Exam  Vitals  Blood pressure 117/74, pulse 145, temperature 99 F (37.2 C), temperature source Oral, resp. rate 20, SpO2 100 %.   General:  Pleasant, shy appearing female lying in bed complaining of pain in her left heel.  Psych:  Normal affect and insight, Not Suicidal or Homicidal, Awake Alert, Oriented X 3.  Neuro:   No F.N deficits, ALL C.Nerves Intact, Strength 5/5 all 4 extremities, Sensation intact all 4 extremities.  ENT:  Ears and Eyes appear Normal, Conjunctivae clear, PER. Moist oral mucosa without erythema or exudates.  Neck:  Supple, No lymphadenopathy appreciated  Respiratory:  Symmetrical chest wall movement, Good air movement bilaterally, CTAB.  Cardiac:  Tachycardic, No Murmurs, no LE edema noted, no JVD.    Abdomen:  Positive bowel sounds, Soft, Non tender, Non distended,  No masses appreciated  Skin:  No Cyanosis, Normal Skin Turgor, annular ulceration on left heel approximately 2 inches in diameter with foul odor.  Extremities:  Able to move all 4. 5/5 strength in each,  no effusions.   Previous transmetatarsal amputation on the left. Hemodialysis access in her right thigh.  Data Review  Wt Readings from Last 3 Encounters:  03/23/15 68.856 kg (151 lb 12.8 oz)  02/16/15 70.308 kg (155 lb)  01/11/15 70.308 kg (155 lb)    CBC  Recent Labs Lab 03/25/15 1125  WBC 10.0  HGB 13.9  HCT 42.7  PLT 197  MCV 93.4  MCH 30.4  MCHC 32.6  RDW 15.3  LYMPHSABS 1.5  MONOABS 0.8  EOSABS 0.1  BASOSABS 0.0    Chemistries   Recent  Labs Lab 03/25/15 1125  NA 137  K 4.5  CL 94*  CO2 30  GLUCOSE 160*  BUN 26*  CREATININE 7.14*  CALCIUM 9.2     Lab Results  Component Value Date   HGBA1C 8.10 11/23/2014    CREATININE: 7.14 mg/dL ABNORMAL (03/25/15 1125) Estimated creatinine clearance - 8.4 mL/min   Cardiac Enzymes  Recent Labs Lab 03/25/15 1125  TROPONINI <0.03    Imaging results:   Dg Chest 2 View  03/25/2015  CLINICAL DATA:  Mid chest pain today, hypertension, diabetes mellitus, end-stage renal disease, history stroke EXAM: CHEST  2 VIEW COMPARISON:  08/16/2014 FINDINGS: Enlargement of cardiac silhouette. Mediastinal contours and pulmonary vascularity normal. Resolution of RIGHT pleural effusion and basilar atelectasis seen on previous exam. No acute infiltrate, pleural effusion or pneumothorax. Osseous structures unremarkable. IMPRESSION: No acute abnormalities. Enlargement of cardiac silhouette. Electronically Signed   By: Lavonia Dana M.D.   On: 03/25/2015 12:18   Mm Digital Screening Bilateral  03/08/2015  CLINICAL DATA:  Screening. EXAM: DIGITAL SCREENING BILATERAL MAMMOGRAM WITH CAD COMPARISON:  Previous exam(s). ACR Breast Density Category b: There are scattered areas of fibroglandular density. FINDINGS: There are no findings suspicious for malignancy. Images were processed with CAD. IMPRESSION: No mammographic evidence of malignancy. A result letter of this screening mammogram will be mailed directly to the patient. RECOMMENDATION: Screening mammogram in one year. (Code:SM-B-01Y) BI-RADS CATEGORY  1: Negative. Electronically Signed   By: Franki Cabot M.D.   On: 03/08/2015 16:27    My personal review of EKG: Sinus tach, with ST elevation in leads I, II.   Assessment & Plan  Principal Problem:   Atrial fibrillation with RVR (HCC) Active Problems:   Hyperparathyroidism, secondary (HCC)   Peripheral vascular disease, unspecified (  [place])   End stage renal disease on dialysis ([place])   DM  (diabetes mellitus) type II controlled peripheral vascular disorder (HCC)   Ulcer of lower limb (HCC)   Chest pain   Atrial fibrillation with RVR Appreciate cardiology consultation. Uncertain etiology. Will check TSH, free T4, cycle troponins, blood cultures. Obtain 2-D echo. Patient placed on diltiazem drip in the ER. Cards ordering PO diltiazem. Will be admitted to stepdown unit. Have requested heparin and Coumadin per pharmacy.  Stress test in am.  End-stage renal disease on hemodialysis (M/W/F) Nephrology consulted.  Patient completed approximately 1 hour of HD this am.  DM II Glucose 160 in the ER. We'll hold glipizide. Place on sliding scale insulin sensitive.  Ulceration of left lower heel Chronic, nonhealing. Managed by vascular surgery outpatient. Wound consult requested for this admission. Blood cultures obtained to rule out possible bacteremia from the wound.  Peripheral vascular disease Continue aspirin and Plavix.  Consultants Called:    Nephrology  Family Communication: Patient is alert, orientated and understands their plan of care.      Code Status:    Full code  Condition:    Guarded  Potential Disposition:   To be determined. Likely ready for discharge in approximately 3 days  Time spent in minutes : [place],  PA-C on 03/25/2015 at 1:50 PM Between 7am to 7pm - Pager - [phone] After 7pm go to [email or web address] - password [email or web address] And look for the night coverage person covering me after hours

## 2015-03-25 NOTE — ED Provider Notes (Addendum)
CSN: [id number]     Arrival date & time 03/25/15  [id number] History   First MD Initiated Contact with Patient 03/25/15 1000     Chief Complaint  Patient presents with  . Chest Pain     (Consider location/radiation/quality/duration/timing/severity/associated sxs/prior Treatment) HPI Comments: Patient is a 54 year old female with history of hypertension, diabetes, end-stage renal disease on hemodialysis. She presents for evaluation of chest pressure that started during her dialysis session. She denies any difficulty breathing. She denies any productive cough. She was brought here by EMS and was found to be in atrial fibrillation with rapid ventricular response. She has no prior history of this.  Patient is a 54 y.o. female presenting with chest pain. The history is provided by the patient.  Chest Pain Pain location:  Substernal area Pain quality: pressure   Pain radiates to:  Does not radiate Pain radiates to the back: no   Pain severity:  Moderate Onset quality:  Sudden Timing:  Constant Progression:  Worsening Chronicity:  New Relieved by:  Nothing Worsened by:  Nothing tried Ineffective treatments:  None tried   Past Medical History  Diagnosis Date  . ESRD (end stage renal disease) ([place])   . Hypertension   . CVA (cerebral infarction)     right parietal 05/19/2000  . Vaginal bleeding   . Hyperparathyroidism   . Pneumonia   . Stroke ([place])     2002,no residual  . Peripheral vascular disease ([place])   . GERD (gastroesophageal reflux disease)   . Anemia   . DM (diabetes mellitus) type II controlled peripheral vascular disorder ([place])   . Slip/trip w/o falling due to stepping on object, subs July  2014   Past Surgical History  Procedure Laterality Date  . Parathyroidectomy with autotransplant  12/07/2010  . Knee surgery      right, x2  . Brachial artery graft      x2 for dialysis  . Av fistula placement      upper rt thigh  . Dental extractions Bilateral   . Dilation and  curettage of uterus      hx: of 1986  . 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 Leg surgery Right   . Foot surgery Left     5 TOES AMPUTATED  . Abdominal aortagram N/A 05/27/2012    Procedure: ABDOMINAL Maxcine Ham;  Surgeon: Serafina Mitchell, MD;  Location: [facility] CATH LAB;  Service: Cardiovascular;  Laterality: N/A;  . Lower extremity angiogram Left 05/27/2012    Procedure: LOWER EXTREMITY ANGIOGRAM;  Surgeon: Serafina Mitchell, MD;  Location: [facility] CATH LAB;  Service: Cardiovascular;  Laterality: Left;  lt leg angio  . Tooth extraction  Sep 08, 2014  . Peripheral vascular catheterization N/A 01/11/2015    Procedure: Lower Extremity Angiography;  Surgeon: Serafina Mitchell, MD;  Location: [place] CV LAB;  Service: Cardiovascular;  Laterality: N/A;   Family History  Problem Relation Age of Onset  . Hypertension Father   . Kidney disease Mother    Social History  Substance Use Topics  . Smoking status: Never Smoker   . Smokeless tobacco: Never Used  . Alcohol Use: No   OB History    No data available     Review of Systems  Cardiovascular: Positive for chest pain.  All other systems reviewed and are negative.     Allergies  Doxycycline and  Peroxyl  Home Medications   Prior to Admission medications   Medication Sig Start Date End Date Taking? Authorizing Provider  acetaminophen (TYLENOL) 325 MG tablet Take 650 mg by mouth every Monday, Wednesday, and Friday. With dialysis    Historical Provider, MD  amLODipine (NORVASC) 10 MG tablet Take 1 tablet (10 mg total) by mouth at bedtime. 11/23/14   Lance Bosch, NP  aspirin 81 MG tablet Take 1 tablet (81 mg total) by mouth daily. 01/19/14   Lance Bosch, NP  CALCIUM-VITAMIN D PO Take 500 mg by mouth 3 (three) times daily with meals.     Historical Provider, MD  Clopidogrel Bisulfate (PLAVIX PO) Take 75  mg by mouth daily.     Historical Provider, MD  Cyanocobalamin (VITAMIN B-12 PO) Take 1 tablet by mouth 4 (four) times a week. Non dialysis days. Cain Saupe, Sat and Sunday    Historical Provider, MD  diazepam (VALIUM) 5 MG tablet Take 5 mg by mouth every 8 (eight) hours as needed for muscle spasms.  11/15/14   Historical Provider, MD  diphenhydrAMINE (BENADRYL) 25 MG tablet Take 25 mg by mouth every 6 (six) hours as needed (Muscle spasm in feet).    Historical Provider, MD  glipiZIDE (GLUCOTROL) 10 MG tablet Take 0.5 tablets (5 mg total) by mouth daily. 11/23/14   Lance Bosch, NP  multivitamin (RENA-VIT) TABS tablet Take 1 tablet by mouth daily.    Historical Provider, MD  neomycin-bacitracin-polymyxin (NEOSPORIN) [phone] ointment Apply 1 application topically daily. Apply to foot    Historical Provider, MD  polyethylene glycol (MIRALAX / GLYCOLAX) packet Take 17 g by mouth daily. 08/19/14   Reyne Dumas, MD  traMADol (ULTRAM) 50 MG tablet Take 50 mg by mouth 2 (two) times daily as needed for moderate pain (for foot pain). For foot pain 07/26/14   Historical Provider, MD  vitamin C (ASCORBIC ACID) 500 MG tablet Take 500 mg by mouth daily.    Historical Provider, MD  vitamin E 400 UNIT capsule Take 400 Units by mouth daily.    Historical Provider, MD   BP 98/64 mmHg  Pulse 145  Temp(Src) 99 F (37.2 C) (Oral)  Resp 18  SpO2 94% Physical Exam  Constitutional: She is oriented to person, place, and time. She appears well-developed and well-nourished. No distress.  Patient is a 54 year old female who appears chronically ill.  HENT:  Head: Normocephalic and atraumatic.  Mouth/Throat: Oropharynx is clear and moist.  Neck: Normal range of motion. Neck supple.  Cardiovascular:  No murmur heard. Heart is irregularly irregular and rapid.  Pulmonary/Chest: Effort normal and breath sounds normal. No respiratory distress. She has no wheezes.  Abdominal: Soft. Bowel sounds are normal. She exhibits no  distension. There is no tenderness.  Musculoskeletal: Normal range of motion.  Neurological: She is alert and oriented to person, place, and time.  Skin: Skin is warm and dry. She is not diaphoretic.  Nursing note and vitals reviewed.   ED Course  Procedures (including critical care time) Labs Review Labs Reviewed  BASIC METABOLIC PANEL  CBC WITH DIFFERENTIAL/PLATELET  TROPONIN I    Imaging Review No results found. I have personally reviewed and evaluated these images and lab results as part of my medical decision-making.  ED ECG REPORT   Date: 03/25/2015  Rate: 140  Rhythm: atrial fibrillation  QRS Axis: normal  Intervals: normal  ST/T Wave abnormalities: nonspecific T wave changes  Conduction Disutrbances:none  Narrative Interpretation:   Old EKG  Reviewed: changes noted  I have personally reviewed the EKG tracing and agree with the computerized printout as noted.   MDM   Final diagnoses:  None    Patient presents here with complaints of chest pressure and shortness of breath that started while at dialysis. She was found to EMS to be in atrial fibrillation with rapid ventricular response. She arrived here somewhat hypotensive, but her blood pressure responded to a 250 mL bolus of saline. She was then started on a Cardizem drip for which she was on for approximately one hour. She then spontaneously converted back to a sinus rhythm.  Her laboratory studies are otherwise unremarkable. I've discussed this with cardiology who is recommending the patient be admitted to the hospitalist service. I've spoken with Agustina Caroli who will evaluate patient in the ER.  CRITICAL CARE Performed by: Veryl Speak Total critical care time: 45 minutes Critical care time was exclusive of separately billable procedures and treating other patients. Critical care was necessary to treat or prevent imminent or life-threatening deterioration. Critical care was time spent personally by me on  the following activities: development of treatment plan with patient and/or surrogate as well as nursing, discussions with consultants, evaluation of patient's response to treatment, examination of patient, obtaining history from patient or surrogate, ordering and performing treatments and interventions, ordering and review of laboratory studies, ordering and review of radiographic studies, pulse oximetry and re-evaluation of patient's condition.   Veryl Speak, MD 03/25/15 1323  Veryl Speak, MD 03/25/15 [phone]

## 2015-03-25 NOTE — ED Notes (Signed)
MD at bedside. 

## 2015-03-25 NOTE — ED Notes (Signed)
MD and 3 RN's attempting IV.

## 2015-03-25 NOTE — Consult Note (Signed)
[facility] Renal Consultation Note    Indication for Consultation:  Management of ESRD/hemodialysis; anemia, hypertension/volume and secondary hyperparathyroidism. PCP:  HPI: [name] is a 54 y.o. female with ESRD who had hemodialysis MWF at [facility]. Past medical history significant for hypertension, CVA 2002, DMT2, GERD, pneumonia, PVD, secondary hyperparathyroidism, anemia of chronic disease, vaginal bleeding. 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 [name] 07/15/2012. Has had none healing wound on L heel being followed per Dr. [name]. She had atherectomy with angioplasty of the left anterior tibial artery and left popliteal artery with drug coated balloon angioplasty to the left superficial femoral/popliteal artery by Dr. Trula Slade on 01/11/15. L BKA has been recommended but patient refuses.   Patient was 2 hours into hemodialysis today when she developed substernal chest pain. Patient is poor historian, has difficulty describing pain. EMS was called. Patient was found to be in rapid atrial fibrillation with RVR. She was started on cardizem in ED after being given 250 CC NS bolus for SBP 70s. Currently patient is in [place] with rate in 110s. BP 108/71. No c/o chest pain at present. EKG unremarkable except for new onset of AFIB. K+ 4.5, CXR unremarkable. Troponin negative. Patient denies chest pain, fever, chills, malaise, N,V,D, diaphoresis, night sweats, SOB, DOE, orthopnea, abdominal pain, tarry stools, hematochezia, syncope, headache, tinnitus, blurred vision, hearing loss. Denies falls. Currently eating her lunch in NAD.  Patient has hemodialysis M,W, F, at [facility]. Does not miss treatments or sign off early. Last in-center lab values are as follows: HGB 11.6 (03/23/15) Ca 9.3 C Ca 9.1 PTH 119 Phos 7.8 (03/16/2015). Calcium carbonate for binders, no calcitriol or sensipar.   Past Medical History  Diagnosis Date  .  ESRD (end stage renal disease) ([place])   . Hypertension   . CVA (cerebral infarction)     right parietal 05/19/2000  . Vaginal bleeding   . Hyperparathyroidism   . Pneumonia   . Stroke ([place])     2002,no residual  . Peripheral vascular disease ([place])   . GERD (gastroesophageal reflux disease)   . Anemia   . DM (diabetes mellitus) type II controlled peripheral vascular disorder ([place])   . Slip/trip w/o falling due to stepping on object, subs July  2014   Past Surgical History  Procedure Laterality Date  . Parathyroidectomy with autotransplant  12/07/2010  . Knee surgery      right, x2  . Brachial artery graft      x2 for dialysis  . Av fistula placement      upper rt thigh  . Dental extractions Bilateral   . Dilation and curettage of uterus      hx: of 1986  . Transmetatarsal amputation Left 07/15/2012    Procedure: TRANSMETATARSAL AMPUTATION;  Surgeon: Angelia Mould, MD;  Location: [facility] OR;  Service: Vascular;  Laterality: Left;  . Leg surgery Right   . Foot surgery Left     5 TOES AMPUTATED  . Abdominal aortagram N/A 05/27/2012    Procedure: ABDOMINAL Maxcine Ham;  Surgeon: Serafina Mitchell, MD;  Location: [facility] CATH LAB;  Service: Cardiovascular;  Laterality: N/A;  . Lower extremity angiogram Left 05/27/2012    Procedure: LOWER EXTREMITY ANGIOGRAM;  Surgeon: Serafina Mitchell, MD;  Location: [facility] CATH LAB;  Service: Cardiovascular;  Laterality: Left;  lt leg angio  . Tooth extraction  Sep 08, 2014  . Peripheral vascular catheterization N/A 01/11/2015    Procedure: Lower  Extremity Angiography;  Surgeon: Serafina Mitchell, MD;  Location: [place] CV LAB;  Service: Cardiovascular;  Laterality: N/A;   Family History  Problem Relation Age of Onset  . Hypertension Father   . Kidney disease Mother    Social History:  reports that she has never smoked. She has never used smokeless tobacco. She reports that she does not drink alcohol or use illicit drugs. Allergies  Allergen Reactions  .  Doxycycline Itching  . Peroxyl [Hydrogen Peroxide] Hives and Rash   Prior to Admission medications   Medication Sig Start Date End Date Taking? Authorizing Provider  acetaminophen (TYLENOL) 325 MG tablet Take 650 mg by mouth every Monday, Wednesday, and Friday. With dialysis   Yes Historical Provider, MD  amLODipine (NORVASC) 10 MG tablet Take 1 tablet (10 mg total) by mouth at bedtime. 11/23/14  Yes Lance Bosch, NP  aspirin 81 MG tablet Take 1 tablet (81 mg total) by mouth daily. 01/19/14  Yes Lance Bosch, NP  CALCIUM-VITAMIN D PO Take 500 mg by mouth 3 (three) times daily with meals.    Yes Historical Provider, MD  Clopidogrel Bisulfate (PLAVIX PO) Take 75 mg by mouth daily.    Yes Historical Provider, MD  Cyanocobalamin (VITAMIN B-12 PO) Take 1 tablet by mouth 4 (four) times a week. Non dialysis days. Cain Saupe, Sat and Sunday   Yes Historical Provider, MD  diazepam (VALIUM) 5 MG tablet Take 5 mg by mouth every 8 (eight) hours as needed for muscle spasms.  11/15/14  Yes Historical Provider, MD  diphenhydrAMINE (BENADRYL) 25 MG tablet Take 25 mg by mouth every 6 (six) hours as needed (Muscle spasm in feet).   Yes Historical Provider, MD  glipiZIDE (GLUCOTROL) 10 MG tablet Take 0.5 tablets (5 mg total) by mouth daily. 11/23/14  Yes Lance Bosch, NP  multivitamin (RENA-VIT) TABS tablet Take 1 tablet by mouth daily.   Yes Historical Provider, MD  neomycin-bacitracin-polymyxin (NEOSPORIN) [phone] ointment Apply 1 application topically daily. Apply to foot   Yes Historical Provider, MD  traMADol (ULTRAM) 50 MG tablet Take 50 mg by mouth 2 (two) times daily as needed for moderate pain (for foot pain). For foot pain 07/26/14  Yes Historical Provider, MD  vitamin C (ASCORBIC ACID) 500 MG tablet Take 500 mg by mouth daily.   Yes Historical Provider, MD  vitamin E 400 UNIT capsule Take 400 Units by mouth daily.   Yes Historical Provider, MD  polyethylene glycol (MIRALAX / GLYCOLAX) packet Take 17 g  by mouth daily. Patient taking differently: Take 17 g by mouth daily as needed for mild constipation.  08/19/14   Reyne Dumas, MD   Current Facility-Administered Medications  Medication Dose Route Frequency Provider Last Rate Last Dose  . acetaminophen (TYLENOL) tablet 650 mg  650 mg Oral Q M,W,F Bobby Rumpf York, PA-C      . acetaminophen (TYLENOL) tablet 650 mg  650 mg Oral Q4H PRN Melton Alar, PA-C      . aspirin tablet 81 mg  81 mg Oral Daily Melton Alar, PA-C      . Calcium-Vitamin D 500-100 MG-UNIT WAFR   Oral TID WC Marianne L York, PA-C      . clopidogrel (PLAVIX) tablet 75 mg  75 mg Oral Daily Marianne L York, PA-C      . diazepam (VALIUM) tablet 5 mg  5 mg Oral Q8H PRN Bobby Rumpf York, PA-C      . diltiazem (CARDIZEM CD)  24 hr capsule 120 mg  120 mg Oral Daily Almyra Deforest, [place]      . diltiazem (CARDIZEM) tablet 30 mg  30 mg Oral Q8H PRN Almyra Deforest, PA      . diphenhydrAMINE (BENADRYL) tablet 25 mg  25 mg Oral Q6H PRN Melton Alar, PA-C      . insulin aspart (novoLOG) injection 0-9 Units  0-9 Units Subcutaneous TID WC Marianne L York, PA-C      . multivitamin (RENA-VIT) tablet 1 tablet  1 tablet Oral Daily Melton Alar, PA-C      . neomycin-bacitracin-polymyxin (NEOSPORIN) ointment 1 application  1 application Topical Daily Bobby Rumpf York, PA-C      . ondansetron (ZOFRAN) injection 4 mg  4 mg Intravenous Q6H PRN Melton Alar, PA-C      . polyethylene glycol (MIRALAX / GLYCOLAX) packet 17 g  17 g Oral Daily Marianne L York, PA-C      . sodium chloride 0.9 % bolus 250 mL  250 mL Intravenous Once Veryl Speak, MD   250 mL at 03/25/15 1037  . traMADol (ULTRAM) tablet 50 mg  50 mg Oral BID PRN Melton Alar, PA-C      . [START ON 03/26/2015] vitamin B-12 (CYANOCOBALAMIN) tablet 50 mcg  50 mcg Oral Once per day on Sun Tue Thu Sat Bobby Rumpf York, PA-C      . vitamin C (ASCORBIC ACID) tablet 500 mg  500 mg Oral Daily Melton Alar, PA-C      . vitamin E capsule 400 Units  400  Units Oral Daily Melton Alar, PA-C       Current Outpatient Prescriptions  Medication Sig Dispense Refill  . acetaminophen (TYLENOL) 325 MG tablet Take 650 mg by mouth every Monday, Wednesday, and Friday. With dialysis    . amLODipine (NORVASC) 10 MG tablet Take 1 tablet (10 mg total) by mouth at bedtime. 30 tablet 2  . aspirin 81 MG tablet Take 1 tablet (81 mg total) by mouth daily. 90 tablet 3  . CALCIUM-VITAMIN D PO Take 500 mg by mouth 3 (three) times daily with meals.     . Clopidogrel Bisulfate (PLAVIX PO) Take 75 mg by mouth daily.     . Cyanocobalamin (VITAMIN B-12 PO) Take 1 tablet by mouth 4 (four) times a week. Non dialysis days. Tues, Thurs, Sat and Sunday    . diazepam (VALIUM) 5 MG tablet Take 5 mg by mouth every 8 (eight) hours as needed for muscle spasms.   0  . diphenhydrAMINE (BENADRYL) 25 MG tablet Take 25 mg by mouth every 6 (six) hours as needed (Muscle spasm in feet).    Marland Kitchen glipiZIDE (GLUCOTROL) 10 MG tablet Take 0.5 tablets (5 mg total) by mouth daily. 45 tablet 3  . multivitamin (RENA-VIT) TABS tablet Take 1 tablet by mouth daily.    Marland Kitchen neomycin-bacitracin-polymyxin (NEOSPORIN) [phone] ointment Apply 1 application topically daily. Apply to foot    . traMADol (ULTRAM) 50 MG tablet Take 50 mg by mouth 2 (two) times daily as needed for moderate pain (for foot pain). For foot pain  0  . vitamin C (ASCORBIC ACID) 500 MG tablet Take 500 mg by mouth daily.    . vitamin E 400 UNIT capsule Take 400 Units by mouth daily.    . polyethylene glycol (MIRALAX / GLYCOLAX) packet Take 17 g by mouth daily. (Patient taking differently: Take 17 g by mouth daily as needed for mild constipation. ) 14  each 0   Labs: Basic Metabolic Panel:  Recent Labs Lab 03/25/15 1125  NA 137  K 4.5  CL 94*  CO2 30  GLUCOSE 160*  BUN 26*  CREATININE 7.14*  CALCIUM 9.2   Liver Function Tests: No results for input(s): AST, ALT, ALKPHOS, BILITOT, PROT, ALBUMIN in the last 168 hours. No  results for input(s): LIPASE, AMYLASE in the last 168 hours. No results for input(s): AMMONIA in the last 168 hours. CBC:  Recent Labs Lab 03/25/15 1125  WBC 10.0  NEUTROABS 7.6  HGB 13.9  HCT 42.7  MCV 93.4  PLT 197   Cardiac Enzymes:  Recent Labs Lab 03/25/15 1125  TROPONINI <0.03   CBG: No results for input(s): GLUCAP in the last 168 hours. Iron Studies: No results for input(s): IRON, TIBC, TRANSFERRIN, FERRITIN in the last 72 hours. Studies/Results: Dg Chest 2 View  03/25/2015  CLINICAL DATA:  Mid chest pain today, hypertension, diabetes mellitus, end-stage renal disease, history stroke EXAM: CHEST  2 VIEW COMPARISON:  08/16/2014 FINDINGS: Enlargement of cardiac silhouette. Mediastinal contours and pulmonary vascularity normal. Resolution of RIGHT pleural effusion and basilar atelectasis seen on previous exam. No acute infiltrate, pleural effusion or pneumothorax. Osseous structures unremarkable. IMPRESSION: No acute abnormalities. Enlargement of cardiac silhouette. Electronically Signed   By: Lavonia Dana M.D.   On: 03/25/2015 12:18    ROS: As per HPI otherwise negative.   Physical Exam: Filed Vitals:   03/25/15 1330 03/25/15 1400 03/25/15 1415 03/25/15 1430  BP: 117/74 107/75 114/68 109/66  Pulse:  109 104 103  Temp:      TempSrc:      Resp: 20 21 16 15   SpO2:  99% 98% 99%     General: Well developed, well nourished, in no acute distress. Head: Normocephalic, atraumatic, sclera non-icteric, mucus membranes are moist Neck: Supple. JVD not elevated. Lungs: Clear bilaterally to auscultation without wheezes, rales, or rhonchi. Breathing is unlabored. Heart: RRR with S1 S2. II/VI systolic M. no rubs, or gallops appreciated. Abdomen: Soft, non-tender, non-distended with normoactive bowel sounds. No rebound/guarding. No obvious abdominal masses. M-S:  Strength and tone appear normal for age. Lower extremities: L transmet amputation. Drsg on L heel, CDI. Faint pulses R  foot. No pulse L foot. No LE edema.  Neuro: Alert and oriented X 3. Moves all extremities spontaneously. Psych:  Responds to questions appropriately with a normal affect. Dialysis Access: R thigh AVF + thrill/+Bruit.   Dialysis Orders: Center: [facility] on MWF . MonWedFri, 3 hrs 30 min, 160NRe Optiflux, BFR 450, DFR Autoflow 1.5, EDW 67.5 (kg), Dialysate 2.0 K, 2.25 Ca, UFR Profile: Profile 2, Sodium Model: Linear, Access: R thigh AV Graft-Standard  Heparin: 1000 units per treatment  Assessment/Plan: 1.  Afib/RVR: Sent from OP HD unit with C/O chest pain. Found to be in AFIB/RVR. Currently on diltiazem drip. No Chest pain.  2.  ESRD -  MWF at [facility]. Had approx. 2 hours of HD today. K+ 4.5 Scr 7.14 BUN 26 Will hold off finishing HD for now.  3.  Hypertension/volume  - SBP in low 100s. Take amlodipine 10 mg PO Q hs at home. Hold for now. 4.  Anemia  - HGB 13.9. No OP ESA. Monitor HGB 5.  Metabolic bone disease -  Calcium Carbonate binders, no Calcitriol. Ca 9.2 6.  Nutrition - change to renal/carb mod diet with renal vit 7. DM: Per primary 8. Nonhealing ulcer L. Heel: Being followed per Dr. Dickson-recently debrided. HHA assists with wound  care at home.    Rita H. Owens Shark, NP-C 03/25/2015, 3:09 PM  [employer] [phone]  Pt seen, examined and agree w A/P as above.  Kelly Splinter MD [employer] pager [phone]    cell [phone] 03/25/2015, 4:55 PM

## 2015-03-25 NOTE — Consult Note (Signed)
CARDIOLOGY CONSULT NOTE   Patient ID: [name] MRN: [id number], DOB/AGE: 11/06/1960   Admit date: 03/25/2015 Date of Consult: 03/25/2015   Primary Physician: Lance Bosch, NP Primary Cardiologist: new  Primary vascular surgeon: Dr. Lisette Grinder Primary nephrologist: Dr. Lorrene Reid  Pt. Profile  [name] is a pleasant 54 year old African-American female with past medical history of HTN, DM, history of remote CVA in January 1952, hyperparathyroidism, PVD, and ESRD on HD MWF since 2002 presented with chest pain and afib with RVR  Problem List  Past Medical History  Diagnosis Date  . ESRD (end stage renal disease) ([place])   . Hypertension   . CVA (cerebral infarction)     right parietal 05/19/2000  . Vaginal bleeding   . Hyperparathyroidism   . Pneumonia   . Stroke ([place])     2002,no residual  . Peripheral vascular disease ([place])   . GERD (gastroesophageal reflux disease)   . Anemia   . DM (diabetes mellitus) type II controlled peripheral vascular disorder ([place])   . Slip/trip w/o falling due to stepping on object, subs July  2014    Past Surgical History  Procedure Laterality Date  . Parathyroidectomy with autotransplant  12/07/2010  . Knee surgery      right, x2  . Brachial artery graft      x2 for dialysis  . Av fistula placement      upper rt thigh  . Dental extractions Bilateral   . Dilation and curettage of uterus      hx: of 1986  . Transmetatarsal amputation Left 07/15/2012    Procedure: TRANSMETATARSAL AMPUTATION;  Surgeon: Angelia Mould, MD;  Location: [facility] OR;  Service: Vascular;  Laterality: Left;  . Leg surgery Right   . Foot surgery Left     5 TOES AMPUTATED  . Abdominal aortagram N/A 05/27/2012    Procedure: ABDOMINAL Maxcine Ham;  Surgeon: Serafina Mitchell, MD;  Location: [facility] CATH LAB;  Service: Cardiovascular;  Laterality: N/A;  . Lower extremity angiogram Left 05/27/2012    Procedure: LOWER EXTREMITY ANGIOGRAM;  Surgeon: Serafina Mitchell, MD;   Location: [facility] CATH LAB;  Service: Cardiovascular;  Laterality: Left;  lt leg angio  . Tooth extraction  Sep 08, 2014  . Peripheral vascular catheterization N/A 01/11/2015    Procedure: Lower Extremity Angiography;  Surgeon: Serafina Mitchell, MD;  Location: [facility] CV LAB;  Service: Cardiovascular;  Laterality: N/A;     Allergies  Allergies  Allergen Reactions  . Doxycycline Itching  . Peroxyl [Hydrogen Peroxide] Hives and Rash    HPI   [name] is a pleasant 54 year old African-American female with past medical history of HTN, DM, history of remote CVA in January 1952, hyperparathyroidism, PVD, and ESRD on HD MWF since 2002. According to the patient, she denies any previous history of MI nor has she had any evaluation by cardiology service. She denies any prior cardiac cath and stress test. She had significant past medical history for PAD and has nonhealing wound in the medial side of left heel. She also had transmetatarsal amputation on the left side in 2014. She previously underwent atherectomy with angioplasty of the left anterior tibial artery and left popliteal artery with drug coated balloon angioplasty to the left superficial femoral and popliteal artery in September 2016. She was seen in the office by vascular surgery on 02/16/2015 where her left heel ulcer was debrided. She has no further revascularization options, she was offered below knee amputation but she is  was very opposed to this. She usually have wet-to-dry dressing daily with help of a home aide. She continued to live by herself in an apartment. She does get around with a walker. She reports remote history of blood transfusion when her hemoglobin was very low. However she denies significant blood in the stool or urine. She had falls in the past, however denies any recent fall. She follows Dr. Lorrene Reid for her dialysis. She had upper extremity AV fistula before, however there are no longer functioning. She is getting her dialysis  via right thigh AV fistula. She denies any history of chest pain, palpitation or any awareness of irregular rhythm in the past. She denies any recent fever, chill, or cough. She was instructed to take her amlodipine on nondialysis days, however has been missing a few doses of amlodipine even on nondialysis day as well.  She was in the middle of her dialysis session on 03/25/2015 when she started having chest pain. Her usual duration is 3-1/2 hours, roughly 1 hour into today's dialysis, she started having chest discomfort. Nursing staff noticed she was in atrial fibrillation with RVR. She was also hypertensive. Her dialysis was stopped early because of this. She was sent to [facility] for further evaluation. On arrival to [facility], she was noted to be in atrial fibrillation with RVR based on EKG finding. Initially, she was hypertensive on IV dialysis. Fortunately she converted around 12:20 PM today in the ED to sinus tachycardia. Since then, she has been chest pain-free and her blood pressure is better. Cardiology is consulted for newly diagnosed atrial fibrillation with RVR.    No current facility-administered medications on file prior to encounter.   Current Outpatient Prescriptions on File Prior to Encounter  Medication Sig Dispense Refill  . acetaminophen (TYLENOL) 325 MG tablet Take 650 mg by mouth every Monday, Wednesday, and Friday. With dialysis    . amLODipine (NORVASC) 10 MG tablet Take 1 tablet (10 mg total) by mouth at bedtime. 30 tablet 2  . aspirin 81 MG tablet Take 1 tablet (81 mg total) by mouth daily. 90 tablet 3  . CALCIUM-VITAMIN D PO Take 500 mg by mouth 3 (three) times daily with meals.     . Clopidogrel Bisulfate (PLAVIX PO) Take 75 mg by mouth daily.     . Cyanocobalamin (VITAMIN B-12 PO) Take 1 tablet by mouth 4 (four) times a week. Non dialysis days. Tues, Thurs, Sat and Sunday    . diazepam (VALIUM) 5 MG tablet Take 5 mg by mouth every 8 (eight) hours as  needed for muscle spasms.   0  . diphenhydrAMINE (BENADRYL) 25 MG tablet Take 25 mg by mouth every 6 (six) hours as needed (Muscle spasm in feet).    Marland Kitchen glipiZIDE (GLUCOTROL) 10 MG tablet Take 0.5 tablets (5 mg total) by mouth daily. 45 tablet 3  . multivitamin (RENA-VIT) TABS tablet Take 1 tablet by mouth daily.    Marland Kitchen neomycin-bacitracin-polymyxin (NEOSPORIN) [phone] ointment Apply 1 application topically daily. Apply to foot    . traMADol (ULTRAM) 50 MG tablet Take 50 mg by mouth 2 (two) times daily as needed for moderate pain (for foot pain). For foot pain  0  . vitamin C (ASCORBIC ACID) 500 MG tablet Take 500 mg by mouth daily.    . vitamin E 400 UNIT capsule Take 400 Units by mouth daily.    . polyethylene glycol (MIRALAX / GLYCOLAX) packet Take 17 g by mouth daily. (Patient taking differently:  Take 17 g by mouth daily as needed for mild constipation. ) 14 each 0        Family History Family History  Problem Relation Age of Onset  . Hypertension Father   . Kidney disease Mother      Social History Social History   Social History  . Marital Status: Single    Spouse Name: N/A  . Number of Children: N/A  . Years of Education: N/A   Occupational History  . Not on file.   Social History Main Topics  . Smoking status: Never Smoker   . Smokeless tobacco: Never Used  . Alcohol Use: No  . Drug Use: No  . Sexual Activity: No   Other Topics Concern  . Not on file   Social History Narrative     Review of Systems  General:  No chills, fever, night sweats or weight changes.  Cardiovascular:  No dyspnea on exertion, edema, orthopnea, palpitations, paroxysmal nocturnal dyspnea. +chest pain Dermatological: No rash, lesions/masses Respiratory: No cough, dyspnea Urologic: No hematuria, dysuria Abdominal:   No nausea, vomiting, diarrhea, bright red blood per rectum, melena, or hematemesis Neurologic:  No visual changes, wkns, changes in mental status. All other systems  reviewed and are otherwise negative except as noted above.  Physical Exam  Blood pressure 107/75, pulse 109, temperature 99 F (37.2 C), temperature source Oral, resp. rate 21, SpO2 99 %.  General: Pleasant, NAD Psych: Normal affect. Neuro: Alert and oriented X 3. Moves all extremities spontaneously. HEENT: Normal  Neck: Supple without bruits or JVD. Lungs:  Resp regular and unlabored, CTA. Heart: RRR no s3, s4, or murmurs. Abdomen: Soft, non-tender, non-distended, BS + x 4.  Extremities: No clubbing, cyanosis or edema. L medial heal ulcer noted. RLE has 2+ pulse  Labs   Recent Labs  03/25/15 1125  TROPONINI <0.03   Lab Results  Component Value Date   WBC 10.0 03/25/2015   HGB 13.9 03/25/2015   HCT 42.7 03/25/2015   MCV 93.4 03/25/2015   PLT 197 03/25/2015     Recent Labs Lab 03/25/15 1125  NA 137  K 4.5  CL 94*  CO2 30  BUN 26*  CREATININE 7.14*  CALCIUM 9.2  GLUCOSE 160*   Lab Results  Component Value Date   CHOL 136 10/27/2013   HDL 48 10/27/2013   LDLCALC 73 10/27/2013   TRIG 74 10/27/2013   No results found for: DDIMER  Radiology/Studies  Dg Chest 2 View  03/25/2015  CLINICAL DATA:  Mid chest pain today, hypertension, diabetes mellitus, end-stage renal disease, history stroke EXAM: CHEST  2 VIEW COMPARISON:  08/16/2014 FINDINGS: Enlargement of cardiac silhouette. Mediastinal contours and pulmonary vascularity normal. Resolution of RIGHT pleural effusion and basilar atelectasis seen on previous exam. No acute infiltrate, pleural effusion or pneumothorax. Osseous structures unremarkable. IMPRESSION: No acute abnormalities. Enlargement of cardiac silhouette. Electronically Signed   By: Lavonia Dana M.D.   On: 03/25/2015 12:18   Mm Digital Screening Bilateral  03/08/2015  CLINICAL DATA:  Screening. EXAM: DIGITAL SCREENING BILATERAL MAMMOGRAM WITH CAD COMPARISON:  Previous exam(s). ACR Breast Density Category b: There are scattered areas of fibroglandular  density. FINDINGS: There are no findings suspicious for malignancy. Images were processed with CAD. IMPRESSION: No mammographic evidence of malignancy. A result letter of this screening mammogram will be mailed directly to the patient. RECOMMENDATION: Screening mammogram in one year. (Code:SM-B-01Y) BI-RADS CATEGORY  1: Negative. Electronically Signed   By: Roxy Horseman.D.  On: 03/08/2015 16:27    ECG  Sinus tach with HR 100s  ASSESSMENT AND PLAN  1. Newly diagnosed atrial fibrillation with RVR  - CHA2DS2-Vasc score 6 (female, CVA, HTN, DM, PAD)  - converted on IV diltiazem. Was hypotensive during RVR, BP improved after converting on diltiazem  - start 120mg  PO diltiazem CD daily for rate control for nondialysis days. Potentially 30mg  short acting diltiazem as needed on dialysis days  - if stress test negative, potentially start on Coumadin tomorrow  - obtain echo  2. Chest pain  - unclear if related to afib with RVR, surprising that she is a vasculopathy who had transmetatarsal amputation and multiple LE angio before, however no stress test before. Given degree of PAD, would not be surprised if she has underlying CAD  - NPO past midnight, 1 day lexiscan stress test tomorrow  3. Nonhealing ulcer of L medial heel  - consider let vascular surgeon know patient is in the hospital. Per IM  3. ESRD on HD MWF since 2002 followed by Dr. Lorrene Reid  4. Severe PAD, per Dr. [name], not revascularization candidate  - L BKA was recommended but she refused  5. HTN - stable (alternating med dosing based upon HD day) -- converted from Amlodipine to Diltiazem. 6. DM 7. history of remote CVA in January 1952 8. hyperparathyroidism  Signed, Almyra Deforest, PA-C 03/25/2015, 2:11 PM    I have seen, examined and evaluated the patient this PM along with Mr. Eulas Post, [place].  After reviewing all the available data and chart,  I agree with his findings, examination as well as impression recommendations.  Very  unfortunate 54 year old woman with severe PAD, brittle diabetes and end-stage renal disease who now presents with atrial fibrillation the setting of her dialysis today. She has a severe poorly with healing ulcer on the left heel, but has declined BKA because of concern for lack of mobility.  She clearly has significant risk for CAD, therefore with new diagnosis of A. fib, needs ischemic evaluation as well as structural cardiac evaluation with Myoview and echocardiogram.  Her CHA2DS2VASC is 6, and clearly would have a stroke protection benefit from anticoagulation. With her being on dialysis think the best option would be warfarin, however this is always touchy subject as far as intermittent dialysis access. She is already on aspirin plus Plavix for PAD. If he were to use warfarin, I would stop aspirin.  Pending results of echo and nuclear stress test, we can consider the possibility of antiarrhythmics, however this may simply just be at event triggered by wound related infection versus hypotension from dialysis -- would prefer rate control at this point. Would also need to consider stability with ambulation lambert considering anticoagulation. She has not had significant falls or bleeds on aspirin plus Plavix, but would be of major concern with warfarin plus Plavix.  She is currently in sinus rhythm, being admitted by the medicine service.   we will follow along pending results of her evaluation to help determine future plans of treatment.    Leonie Man, M.D., M.S. Interventional Cardiologist   Pager # [phone]

## 2015-03-25 NOTE — Progress Notes (Signed)
ANTICOAGULATION CONSULT NOTE - Initial Consult  Pharmacy Consult for Heparin, Coumadin  Indication: atrial fibrillation  Allergies  Allergen Reactions  . Doxycycline Itching  . Peroxyl [Hydrogen Peroxide] Hives and Rash    Patient Measurements:   Heparin Dosing Weight: 65 kg   Vital Signs: Temp: 99 F (37.2 C) (12/02 0950) Temp Source: Oral (12/02 0950) BP: 109/66 mmHg (12/02 1430) Pulse Rate: 103 (12/02 1430)  Labs:  Recent Labs  03/25/15 1125  HGB 13.9  HCT 42.7  PLT 197  CREATININE 7.14*  TROPONINI <0.03    Estimated Creatinine Clearance: 8.4 mL/min (by C-G formula based on Cr of 7.14).   Medical History: Past Medical History  Diagnosis Date  . ESRD (end stage renal disease) ([place])   . Hypertension   . CVA (cerebral infarction)     right parietal 05/19/2000  . Vaginal bleeding   . Hyperparathyroidism   . Pneumonia   . Stroke ([place])     2002,no residual  . Peripheral vascular disease ([place])   . GERD (gastroesophageal reflux disease)   . Anemia   . DM (diabetes mellitus) type II controlled peripheral vascular disorder ([place])   . Slip/trip w/o falling due to stepping on object, subs July  2014    Medications:   (Not in a hospital admission)  Assessment: 35 YOF who presented with chest pain and new afib with RVR. Pharmacy consulted to start IV heparin and Coumadin for Afib. H/H and Plt wnl. CrCl < 10 mL/min. She was not on any anticoagulants prior to admission.   Goal of Therapy:  Heparin level 0.3-0.7 units/ml  INR 2-3  Monitor platelets by anticoagulation protocol: Yes   Plan:  -Give heparin 4000 units IV bolus followed by infusion at 750 units/hr -Give Coumadin 7.5 mg once today  -F/u 8 hr HL -Monitor daily HL, CBC, PT/INR and s/s of bleeding -Stop IV heparin when INR > 2   Albertina Parr, PharmD., BCPS Clinical Pharmacist Pager [phone]

## 2015-03-25 NOTE — ED Notes (Signed)
Lunch tray delivered; wound rewrapped with nonadherant.

## 2015-03-26 ENCOUNTER — Other Ambulatory Visit (HOSPITAL_COMMUNITY): Payer: Medicare Other

## 2015-03-26 ENCOUNTER — Inpatient Hospital Stay (HOSPITAL_COMMUNITY): Payer: Medicare Other

## 2015-03-26 DIAGNOSIS — L97922 Non-pressure chronic ulcer of unspecified part of left lower leg with fat layer exposed: Secondary | ICD-10-CM

## 2015-03-26 DIAGNOSIS — R079 Chest pain, unspecified: Secondary | ICD-10-CM

## 2015-03-26 DIAGNOSIS — I4891 Unspecified atrial fibrillation: Secondary | ICD-10-CM

## 2015-03-26 LAB — HEPARIN LEVEL (UNFRACTIONATED)
HEPARIN UNFRACTIONATED: 0.28 [IU]/mL — AB (ref 0.30–0.70)
Heparin Unfractionated: 0.26 IU/mL — ABNORMAL LOW (ref 0.30–0.70)
Heparin Unfractionated: 0.35 IU/mL (ref 0.30–0.70)

## 2015-03-26 LAB — NM MYOCAR MULTI W/SPECT W/WALL MOTION / EF
CHL CUP MPHR: 168 {beats}/min
CSEPEW: 1 METS
CSEPHR: 61 %
CSEPPHR: 104 {beats}/min
Exercise duration (min): 5 min
Exercise duration (sec): 17 s
Rest HR: 91 {beats}/min

## 2015-03-26 LAB — GLUCOSE, CAPILLARY
GLUCOSE-CAPILLARY: 230 mg/dL — AB (ref 65–99)
Glucose-Capillary: 109 mg/dL — ABNORMAL HIGH (ref 65–99)
Glucose-Capillary: 164 mg/dL — ABNORMAL HIGH (ref 65–99)
Glucose-Capillary: 127 mg/dL — ABNORMAL HIGH (ref 65–99)

## 2015-03-26 LAB — BASIC METABOLIC PANEL
Anion gap: 14 (ref 5–15)
BUN: 38 mg/dL — AB (ref 6–20)
CO2: 25 mmol/L (ref 22–32)
CREATININE: 8.75 mg/dL — AB (ref 0.44–1.00)
Calcium: 8.5 mg/dL — ABNORMAL LOW (ref 8.9–10.3)
Chloride: 96 mmol/L — ABNORMAL LOW (ref 101–111)
GFR calc Af Amer: 5 mL/min — ABNORMAL LOW (ref >60)
GFR, EST NON AFRICAN AMERICAN: 5 mL/min — AB (ref >60)
Glucose, Bld: 131 mg/dL — ABNORMAL HIGH (ref 65–99)
Potassium: 4.4 mmol/L (ref 3.5–5.1)
SODIUM: 135 mmol/L (ref 135–145)

## 2015-03-26 LAB — CBC
HCT: 34 % — ABNORMAL LOW (ref 36.0–46.0)
Hemoglobin: 11.1 g/dL — ABNORMAL LOW (ref 12.0–15.0)
MCH: 30.2 pg (ref 26.0–34.0)
MCHC: 32.6 g/dL (ref 30.0–36.0)
MCV: 92.4 fL (ref 78.0–100.0)
PLATELETS: 192 10*3/uL (ref 150–400)
RBC: 3.68 MIL/uL — ABNORMAL LOW (ref 3.87–5.11)
RDW: 15.5 % (ref 11.5–15.5)
WBC: 8.2 10*3/uL (ref 4.0–10.5)

## 2015-03-26 LAB — PROTIME-INR
INR: 1.34 (ref 0.00–1.49)
Prothrombin Time: 16.7 seconds — ABNORMAL HIGH (ref 11.6–15.2)

## 2015-03-26 LAB — TROPONIN I: Troponin I: 0.05 ng/mL — ABNORMAL HIGH (ref <0.031)

## 2015-03-26 LAB — HEMOGLOBIN A1C
Hgb A1c MFr Bld: 7.4 % — ABNORMAL HIGH (ref 4.8–5.6)
MEAN PLASMA GLUCOSE: 166 mg/dL

## 2015-03-26 MED ORDER — TECHNETIUM TC 99M SESTAMIBI GENERIC - CARDIOLITE
10.0000 | Freq: Once | INTRAVENOUS | Status: AC | PRN
  Administered 2015-03-26: 10 via INTRAVENOUS

## 2015-03-26 MED ORDER — REGADENOSON 0.4 MG/5ML IV SOLN
INTRAVENOUS | Status: AC
  Administered 2015-03-26: 0.4 mg via INTRAVENOUS
  Filled 2015-03-26: qty 5

## 2015-03-26 MED ORDER — HEPARIN BOLUS VIA INFUSION
1000.0000 [IU] | Freq: Once | INTRAVENOUS | Status: AC
  Administered 2015-03-26: 1000 [IU] via INTRAVENOUS
  Filled 2015-03-26: qty 1000

## 2015-03-26 MED ORDER — TECHNETIUM TC 99M SESTAMIBI GENERIC - CARDIOLITE
30.0000 | Freq: Once | INTRAVENOUS | Status: AC | PRN
  Administered 2015-03-26: 30 via INTRAVENOUS

## 2015-03-26 MED ORDER — REGADENOSON 0.4 MG/5ML IV SOLN
0.4000 mg | Freq: Once | INTRAVENOUS | Status: AC
  Administered 2015-03-26: 0.4 mg via INTRAVENOUS
  Filled 2015-03-26: qty 5

## 2015-03-26 NOTE — Progress Notes (Signed)
ANTICOAGULATION CONSULT NOTE - Follow-up Consult  Pharmacy Consult for Heparin, Warfarin Indication: atrial fibrillation  Allergies  Allergen Reactions  . Doxycycline Itching  . Peroxyl [Hydrogen Peroxide] Hives and Rash    Patient Measurements: Height: 5\' 2"  (157.5 cm) Weight: 155 lb 14.4 oz (70.716 kg) IBW/kg (Calculated) : 50.1 Heparin Dosing Weight: 65 kg   Vital Signs: Temp: 99.1 F (37.3 C) (12/03 0614) Temp Source: Oral (12/03 0614) BP: 134/68 mmHg (12/03 1521) Pulse Rate: 103 (12/03 1203)  Labs:  Recent Labs  03/25/15 1125 03/25/15 1725 03/25/15 2059 03/26/15 0018 03/26/15 0236 03/26/15 1300  HGB 13.9  --   --   --  11.1*  --   HCT 42.7  --   --   --  34.0*  --   PLT 197  --   --   --  192  --   LABPROT  --   --   --  16.7*  --   --   INR  --   --   --  1.34  --   --   HEPARINUNFRC  --   --   --  0.26*  --  0.28*  CREATININE 7.14*  --   --   --  8.75*  --   TROPONINI <0.03 0.04* 0.04*  --  0.05*  --     Estimated Creatinine Clearance: 6.9 mL/min (by C-G formula based on Cr of 8.75).   Assessment: 37 YOF who presented with chest pain and new afib with RVR.   HL 0.28 on 900 units/hr heparin  INR 1.34 s/p 7.5 mg warfarin x 1 dose  H&H 11.4/34, Plt 192  Goal of Therapy:  Heparin level 0.3-0.7 units/ml  INR 2-3  Monitor platelets by anticoagulation protocol: Yes   Plan:  -Bolus heparin with 1000 units then increase drip to 1050 units/hr -Warfarin 7.5 mg x 1 tonight -Repeat HL at 2200 -Daily HL, CBC -Monitor for s/sx of bleeding  Levester Fresh, PharmD, BCPS, [facility] Clinical Pharmacist Pager [phone] 03/26/2015 3:48 PM

## 2015-03-26 NOTE — Evaluation (Signed)
Physical Therapy Evaluation Patient Details Name: [name] MRN: [id number] DOB: 1960-12-09 Today's Date: 03/26/2015   History of Present Illness  Pt is a 54 y/o F from home found to be in a fib w/ RVR during dialysis.  Pt has chronic non healing wound on her Lt heel and has refused suggested amputation.  Pt's PMH includes CVA (Rt parietal), anemia.  Clinical Impression  Pt admitted with above diagnosis. Pt currently with functional limitations due to the deficits listed below (see PT Problem List). [name] is from home w/ intermittent assist from [facility] aide.  Will need to continue [facility] services and introduce HHPT at d/c.  Currently requires supervision for safe ambulation in room using RW.  Pt will benefit from skilled PT to increase their independence and safety with mobility to allow discharge to the venue listed below.      Follow Up Recommendations Home health PT;Supervision - Intermittent    Equipment Recommendations  None recommended by PT    Recommendations for Other Services       Precautions / Restrictions Precautions Precautions: Fall Precaution Comments: chronic wound Lt heel Restrictions Weight Bearing Restrictions: No      Mobility  Bed Mobility               General bed mobility comments: Pt sitting EOB upon PT arrival  Transfers Overall transfer level: Needs assistance Equipment used: Rolling walker (2 wheeled) Transfers: Sit to/from Stand Sit to Stand: Supervision         General transfer comment: Pt w/ safe technique, supervision for pt's safety.  Ambulation/Gait Ambulation/Gait assistance: Supervision Ambulation Distance (Feet): 20 Feet Assistive device: Rolling walker (2 wheeled) Gait Pattern/deviations: Step-through pattern;Decreased stride length;Decreased stance time - left;Antalgic;Trunk flexed;Shuffle     General Gait Details: Pt pushes walker too far ahead and reports that she is tired when cues provided to stand upright w/ RW  closer.  Declines ambulating in hallway 2/2 fatigue.  Shuffle Lt LE.  Stairs            Wheelchair Mobility    Modified Rankin (Stroke Patients Only)       Balance Overall balance assessment: Needs assistance Sitting-balance support: Feet supported Sitting balance-Leahy Scale: Good     Standing balance support: Bilateral upper extremity supported;During functional activity Standing balance-Leahy Scale: Poor Standing balance comment: Relies on RW for support                             Pertinent Vitals/Pain Pain Assessment: 0-10 Pain Score: 7  Pain Location: Lt heel Pain Descriptors / Indicators: Sharp (after bumping it on chair while sitting) Pain Intervention(s): Limited activity within patient's tolerance;Monitored during session;Repositioned    Home Living Family/patient expects to be discharged to:: Private residence Living Arrangements: Alone Available Help at Discharge: Personal care attendant;Available PRN/intermittently Type of Home: Apartment Home Access: Stairs to enter Entrance Stairs-Rails: Left Entrance Stairs-Number of Steps: 3 Home Layout: One level Home Equipment: Walker - 2 wheels      Prior Function Level of Independence: Independent with assistive device(s)         Comments: RW during ambualtion.  Has aide 3x/wk to assist w/ cleaning and cooking.  HHRN 2x/wk for dressing change of Lt heel.       Hand Dominance        Extremity/Trunk Assessment   Upper Extremity Assessment: Overall WFL for tasks assessed  Lower Extremity Assessment: LLE deficits/detail RLE Deficits / Details: chronic wound on Lt heel       Communication   Communication: No difficulties  Cognition Arousal/Alertness: Awake/alert Behavior During Therapy: Flat affect Overall Cognitive Status: No family/caregiver present to determine baseline cognitive functioning Area of Impairment: Problem solving             Problem Solving: Slow  processing General Comments: Pt requires increased time to reply to some questions asked.  Reports that she is very tired.    General Comments      Exercises General Exercises - Lower Extremity Ankle Circles/Pumps: AROM;Both;10 reps;Seated Long Arc Quad: AROM;Both;10 reps;Seated      Assessment/Plan    PT Assessment Patient needs continued PT services  PT Diagnosis Difficulty walking;Acute pain   PT Problem List Decreased strength;Decreased range of motion;Decreased activity tolerance;Decreased balance;Decreased mobility;Decreased cognition;Decreased knowledge of use of DME;Decreased safety awareness;Decreased knowledge of precautions;Decreased skin integrity;Pain  PT Treatment Interventions DME instruction;Gait training;Stair training;Functional mobility training;Therapeutic activities;Therapeutic exercise;Balance training;Neuromuscular re-education;Cognitive remediation;Patient/family education;Wheelchair mobility training   PT Goals (Current goals can be found in the Care Plan section) Acute Rehab PT Goals Patient Stated Goal: to regain her strength so she can participate in therapy more PT Goal Formulation: With patient Time For Goal Achievement: 04/16/15 Potential to Achieve Goals: Good    Frequency Min 3X/week   Barriers to discharge Decreased caregiver support Intermittent assist at home from [facility] aide    Co-evaluation               End of Session   Activity Tolerance: Patient limited by fatigue Patient left: in chair;with call bell/phone within reach Nurse Communication: Mobility status         Time: [name] PT Time Calculation (min) (ACUTE ONLY): 16 min   Charges:   PT Evaluation $Initial PT Evaluation Tier I: 1 Procedure     PT G CodesJoslyn [name] PT, DPT [phone] Pager: [phone] 03/26/2015, 3:20 PM

## 2015-03-26 NOTE — Progress Notes (Signed)
  Echocardiogram 2D Echocardiogram has been performed.  [name] 03/26/2015, 10:20 AM

## 2015-03-26 NOTE — Progress Notes (Signed)
ANTICOAGULATION CONSULT NOTE - Follow-up Consult  Pharmacy Consult for Heparin Indication: atrial fibrillation  Allergies  Allergen Reactions  . Doxycycline Itching  . Peroxyl [Hydrogen Peroxide] Hives and Rash    Patient Measurements: Height: 5\' 2"  (157.5 cm) Weight: 155 lb 3.3 oz (70.4 kg) IBW/kg (Calculated) : 50.1 Heparin Dosing Weight: 65 kg   Vital Signs: Temp: 100.2 F (37.9 C) (12/03 0052) Temp Source: Oral (12/03 0052) BP: 143/72 mmHg (12/03 0052) Pulse Rate: 104 (12/03 0052)  Labs:  Recent Labs  03/25/15 1125 03/25/15 1725 03/25/15 2059 03/26/15 0018  HGB 13.9  --   --   --   HCT 42.7  --   --   --   PLT 197  --   --   --   LABPROT  --   --   --  16.7*  INR  --   --   --  1.34  HEPARINUNFRC  --   --   --  0.26*  CREATININE 7.14*  --   --   --   TROPONINI <0.03 0.04* 0.04*  --     Estimated Creatinine Clearance: 8.5 mL/min (by C-G formula based on Cr of 7.14).   Assessment: 60 YOF who presented with chest pain and new afib with RVR. Pharmacy consulted to start IV heparin bridge to Coumadin for Afib. Heparin level 0.26 (slightly subtherapeutic) on 750 units/hr. No issues with line or bleeding reported per RN.  Goal of Therapy:  Heparin level 0.3-0.7 units/ml  INR 2-3  Monitor platelets by anticoagulation protocol: Yes   Plan:  -Increase heparin to 900 units/hr -F/u 8 hr HL  Sherlon Handing, PharmD, BCPS Clinical pharmacist, pager [phone] 03/26/2015 1:19 AM

## 2015-03-26 NOTE — Progress Notes (Signed)
Subjective:  Back from stress testing no cos  Objective Vital signs in last 24 hours: Filed Vitals:   03/25/15 2009 03/26/15 0052 03/26/15 0614 03/26/15 0814  BP: 97/61 143/72 123/60 118/62  Pulse: 102 104 98   Temp: 99.8 F (37.7 C) 100.2 F (37.9 C) 99.1 F (37.3 C)   TempSrc: Oral Oral Oral   Resp: 18 18 15 17   Height:      Weight:   70.716 kg (155 lb 14.4 oz)   SpO2: 100% 100% 96% 98%   Weight change:  General:  no acute distress. Lungs: CTA/ Breathing is unlabored. Heart: RRR . II/VI SEM. no rubs, or gallops appreciated. Abdomen: Soft, non-tender, non-distended Lower extremities: L transmet amputation. Drsg on L heel,dry  Clean . No pedal  edema.  Dialysis Access: R thigh AVF+Bruit.   OP Dialysis Orders: Center[facility] . MonWedFri, 3 hrs 30 min, 160NRe Optiflux, BFR 450, DFR Autoflow 1.5, EDW 67.5 (kg), Dialysate 2.0 K, 2.25 Ca, UFR Profile: Profile 2, Sodium Model: Linear, Access: R thigh AV Graft-Standard  Heparin: 1000 units per treatment  Problem/Plan: 1. Afib/RVR/ Chest Pain : converted to SR/  Now on po Cardizem and IV Hep / pharmacy RX consult / Card rx /wu ,denies chest pain now.sp stress test result pend  2. ESRD - MWF at [facility]. Had approx. 2 hours of HD fri Dec 2/. K+ 4.4 no current hd need next schd on Mon Dec 5 3. Hypertension/volume - BP = 123/60  Was on Home  amlodipine 10 mg PO Q hs at home. Hold for now./ cardizem po / wt 70.7 ??bedwt not accurate(edw 67.5)  4. Anemia - HGB 13.9>11.1 No OP ESA. Monitor HGB 5. Metabolic bone disease - Calcium Carbonate binders, no Calcitriol. Ca 8.5 6. Nutrition -  renal/carb mod diet with renal vit 7. DM: Per primary 8. Nonhealing ulcer L. Heel: Being followed per Dr. Dickson-recently debrided. HHA assists with wound care at home.   Ernest Haber, PA-C [place] [phone] 03/26/2015,10:41 AM  LOS: 1 day   Pt seen, examined and agree w A/P as above.  Kelly Splinter MD [facility] pager [phone]    cell [phone] 03/26/2015, 3:29 PM    Labs: Basic Metabolic Panel:  Recent Labs Lab 03/25/15 1125 03/26/15 0236  NA 137 135  K 4.5 4.4  CL 94* 96*  CO2 30 25  GLUCOSE 160* 131*  BUN 26* 38*  CREATININE 7.14* 8.75*  CALCIUM 9.2 8.5*   Liver Function Tests: No results for input(s): AST, ALT, ALKPHOS, BILITOT, PROT, ALBUMIN in the last 168 hours. No results for input(s): LIPASE, AMYLASE in the last 168 hours. No results for input(s): AMMONIA in the last 168 hours. CBC:  Recent Labs Lab 03/25/15 1125 03/26/15 0236  WBC 10.0 8.2  NEUTROABS 7.6  --   HGB 13.9 11.1*  HCT 42.7 34.0*  MCV 93.4 92.4  PLT 197 192   Cardiac Enzymes:  Recent Labs Lab 03/25/15 1125 03/25/15 1725 03/25/15 2059 03/26/15 0236  TROPONINI <0.03 0.04* 0.04* 0.05*   CBG:  Recent Labs Lab 03/25/15 1724 03/25/15 2109 03/26/15 0813  GLUCAP 301* 145* 109*     Medications: . heparin 900 Units/hr (03/26/15 0135)   . acetaminophen  650 mg Oral Q M,W,F  . aspirin  81 mg Oral Daily  . calcium carbonate  1 tablet Oral TID WC  . clopidogrel  75 mg Oral Daily  . diltiazem  120 mg Oral Daily  .  insulin aspart  0-9 Units Subcutaneous TID WC  . multivitamin  1 tablet Oral QHS  . neomycin-bacitracin-polymyxin   Topical Daily  . polyethylene glycol  17 g Oral Daily  . sodium chloride  250 mL Intravenous Once  . vitamin B-12  50 mcg Oral Once per day on Sun Tue Thu Sat  . vitamin C  500 mg Oral Daily  . vitamin E  400 Units Oral Daily  . Warfarin - Physician Dosing Inpatient   Does not apply [phone]

## 2015-03-26 NOTE — Progress Notes (Signed)
Nuc showed no evidence of ischemia; did show septal dyskinesia but otherwise normal LV function. 2D echo with normal EF and no WMA. Per d/w Dr. Wynonia Lawman, will continue plan for heparin->Coumadin transition. Patient made aware.  Will d/c aspirin off med list per prior notes. Aarushi Hemric PA-C

## 2015-03-26 NOTE — Progress Notes (Signed)
ANTICOAGULATION CONSULT NOTE - Follow-up Consult  Pharmacy Consult for Heparin, Warfarin Indication: atrial fibrillation  Allergies  Allergen Reactions  . Doxycycline Itching  . Peroxyl [Hydrogen Peroxide] Hives and Rash    Patient Measurements: Height: 5\' 2"  (157.5 cm) Weight: 155 lb 14.4 oz (70.716 kg) IBW/kg (Calculated) : 50.1 Heparin Dosing Weight: 65 kg   Vital Signs: Temp: 99.1 F (37.3 C) (12/03 2219) Temp Source: Oral (12/03 2219) BP: 125/61 mmHg (12/03 2219) Pulse Rate: 95 (12/03 2219)  Labs:  Recent Labs  03/25/15 1125 03/25/15 1725 03/25/15 2059 03/26/15 0018 03/26/15 0236 03/26/15 1300 03/26/15 2134  HGB 13.9  --   --   --  11.1*  --   --   HCT 42.7  --   --   --  34.0*  --   --   PLT 197  --   --   --  192  --   --   LABPROT  --   --   --  16.7*  --   --   --   INR  --   --   --  1.34  --   --   --   HEPARINUNFRC  --   --   --  0.26*  --  0.28* 0.35  CREATININE 7.14*  --   --   --  8.75*  --   --   TROPONINI <0.03 0.04* 0.04*  --  0.05*  --   --     Estimated Creatinine Clearance: 6.9 mL/min (by C-G formula based on Cr of 8.75).   Assessment: [name] on heparin for afib. Heparin level therapeutic (0.35) on 1050 units/hr. No bleeding noted.  Goal of Therapy:  Heparin level 0.3-0.7 units/ml  INR 2-3  Monitor platelets by anticoagulation protocol: Yes   Plan:  -Continue heparin at 1050 units/hr -Daily HL, CBC -Monitor for s/sx of bleeding  Sherlon Handing, PharmD, BCPS Clinical pharmacist, pager [phone] 03/26/2015 10:47 PM

## 2015-03-26 NOTE — Progress Notes (Signed)
     The patient was seen in nuclear medicine for a lexiscan myoview. She tolerated the procedure well. No acute ST or TW changes on ECG. Await nuclear images.    Jisella Ashenfelter Stern PA-C  [facility]     

## 2015-03-26 NOTE — Progress Notes (Addendum)
PATIENT DETAILS Name: [name] Age: 54 y.o. Sex: female Date of Birth: 07-Jan-1961 Admit Date: 03/25/2015 Admitting Physician Mechele Claude, DO [id number] [place] More[place], NP  Subjective: Denies any chest pain or shortness of breath.  Assessment/Plan: Principal Problem: Atrial fibrillation with RVR: Back in sinus rhythm, required Cardizem infusion on admission. On IV heparin with overlapping Coumadin- CHA2DS2-Vasc score 6  Active Problems: Chest pain: Seen by cardiology, awaiting nuclear stress test. Troponins only minimally elevated-not consistent with ACS-likely secondary to ESRD.   Peripheral vascular disease with nonhealing ulcer in the left foot (s/p left transmetatarsal amputation): Continue antiplatelet agents-reviewed outpatient vascular note-not a candidate for revascularization-BKA has been recommended-which the patient has refused. Continue outpatient follow-up with [facility], and home health services on discharge.  Addendum-spoke with cardiology-Dr. Berkley Harvey discontinue aspirin and just keep on Plavix for now  ESRD: Nephrology following on HD M/W/F  Type 2 diabetes: CBGs stable with SSI-resume glipizide discharge.  History of CVA: Nonfocal exam.  Disposition: Remain inpatient  Antimicrobial agents  See below  Anti-infectives    None      DVT Prophylaxis: Heparin/coumadin  Code Status: Full code   Family Communication None  Procedures: None  CONSULTS:  cardiology and nephrology  Time spent 20 minutes-Greater than 50% of this time was spent in counseling, explanation of diagnosis, planning of further management, and coordination of care.  MEDICATIONS: Scheduled Meds: . acetaminophen  650 mg Oral Q M,W,F  . aspirin  81 mg Oral Daily  . calcium carbonate  1 tablet Oral TID WC  . clopidogrel  75 mg Oral Daily  . diltiazem  120 mg Oral Daily  . insulin aspart  0-9 Units Subcutaneous TID WC  . multivitamin  1 tablet Oral QHS    . neomycin-bacitracin-polymyxin   Topical Daily  . polyethylene glycol  17 g Oral Daily  . regadenoson      . regadenoson  0.4 mg Intravenous Once  . sodium chloride  250 mL Intravenous Once  . vitamin B-12  50 mcg Oral Once per day on Sun Tue Thu Sat  . vitamin C  500 mg Oral Daily  . vitamin E  400 Units Oral Daily  . Warfarin - Physician Dosing Inpatient   Does not apply q1800   Continuous Infusions: . heparin 900 Units/hr (03/26/15 0135)   PRN Meds:.acetaminophen, diazepam, diltiazem, diphenhydrAMINE, ondansetron (ZOFRAN) IV, technetium sestamibi generic, traMADol    PHYSICAL EXAM: Vital signs in last 24 hours: Filed Vitals:   03/26/15 0052 03/26/15 0614 03/26/15 0814 03/26/15 1156  BP: 143/72 123/60 118/62 131/67  Pulse: 104 98  92  Temp: 100.2 F (37.9 C) 99.1 F (37.3 C)    TempSrc: Oral Oral    Resp: 18 15 17    Height:      Weight:  70.716 kg (155 lb 14.4 oz)    SpO2: 100% 96% 98%     Weight change:  Filed Weights   03/25/15 1836 03/26/15 0614  Weight: 70.4 kg (155 lb 3.3 oz) 70.716 kg (155 lb 14.4 oz)   Body mass index is 28.51 kg/(m^2).   Gen Exam: Awake and alert with clear speech.  Neck: Supple, No JVD.   Chest: B/L Clear.   CVS: S1 S2 Regular, no murmurs.  Abdomen: soft, BS +, non tender, non distended.  Extremities:s/p left Transmetatarsal amputation-chronic wound to left heel Neurologic: Non Focal.   Skin: No Rash.   Wounds:  N/A.   Intake/Output from previous day:  Intake/Output Summary (Last 24 hours) at 03/26/15 1158 Last data filed at 03/26/15 0835  Gross per 24 hour  Intake 397.18 ml  Output      0 ml  Net 397.18 ml     LAB RESULTS: CBC  Recent Labs Lab 03/25/15 1125 03/26/15 0236  WBC 10.0 8.2  HGB 13.9 11.1*  HCT 42.7 34.0*  PLT 197 192  MCV 93.4 92.4  MCH 30.4 30.2  MCHC 32.6 32.6  RDW 15.3 15.5  LYMPHSABS 1.5  --   MONOABS 0.8  --   EOSABS 0.1  --   BASOSABS 0.0  --     Chemistries   Recent Labs Lab  03/25/15 1125 03/26/15 0236  NA 137 135  K 4.5 4.4  CL 94* 96*  CO2 30 25  GLUCOSE 160* 131*  BUN 26* 38*  CREATININE 7.14* 8.75*  CALCIUM 9.2 8.5*    CBG:  Recent Labs Lab 03/25/15 1724 03/25/15 2109 03/26/15 0813  GLUCAP 301* 145* 109*    GFR Estimated Creatinine Clearance: 6.9 mL/min (by C-G formula based on Cr of 8.75).  Coagulation profile  Recent Labs Lab 03/26/15 0018  INR 1.34    Cardiac Enzymes  Recent Labs Lab 03/25/15 1725 03/25/15 2059 03/26/15 0236  TROPONINI 0.04* 0.04* 0.05*    Invalid input(s): POCBNP No results for input(s): DDIMER in the last 72 hours.  Recent Labs  03/25/15 1725  HGBA1C 7.4*   No results for input(s): CHOL, HDL, LDLCALC, TRIG, CHOLHDL, LDLDIRECT in the last 72 hours.  Recent Labs  03/25/15 1725  TSH 0.298*   No results for input(s): VITAMINB12, FOLATE, FERRITIN, TIBC, IRON, RETICCTPCT in the last 72 hours. No results for input(s): LIPASE, AMYLASE in the last 72 hours.  Urine Studies No results for input(s): UHGB, CRYS in the last 72 hours.  Invalid input(s): UACOL, UAPR, USPG, UPH, UTP, UGL, UKET, UBIL, UNIT, UROB, ULEU, UEPI, UWBC, URBC, UBAC, CAST, UCOM, BILUA  MICROBIOLOGY: Recent Results (from the past 240 hour(s))  Culture, blood (routine x 2)     Status: None (Preliminary result)   Collection Time: 03/25/15  5:38 PM  Result Value Ref Range Status   Specimen Description BLOOD RIGHT HAND  Final   Special Requests BOTTLES DRAWN AEROBIC ONLY 5CC  Final   Culture PENDING  Incomplete   Report Status PENDING  Incomplete  MRSA PCR Screening     Status: Abnormal   Collection Time: 03/25/15  6:01 PM  Result Value Ref Range Status   MRSA by PCR POSITIVE (A) NEGATIVE Final    Comment:        The GeneXpert MRSA Assay (FDA approved for NASAL specimens only), is one component of a comprehensive MRSA colonization surveillance program. It is not intended to diagnose MRSA infection nor to guide or monitor  treatment for MRSA infections. RESULT CALLED TO, READ BACK BY AND VERIFIED WITH: A.EVANS,RN AT 1920 BY L.PITT 03/25/15     RADIOLOGY STUDIES/RESULTS: Dg Chest 2 View  03/25/2015  CLINICAL DATA:  Mid chest pain today, hypertension, diabetes mellitus, end-stage renal disease, history stroke EXAM: CHEST  2 VIEW COMPARISON:  08/16/2014 FINDINGS: Enlargement of cardiac silhouette. Mediastinal contours and pulmonary vascularity normal. Resolution of RIGHT pleural effusion and basilar atelectasis seen on previous exam. No acute infiltrate, pleural effusion or pneumothorax. Osseous structures unremarkable. IMPRESSION: No acute abnormalities. Enlargement of cardiac silhouette. Electronically Signed   By: Lavonia Dana M.D.   On: 03/25/2015  12:18   Mm Digital Screening Bilateral  03/08/2015  CLINICAL DATA:  Screening. EXAM: DIGITAL SCREENING BILATERAL MAMMOGRAM WITH CAD COMPARISON:  Previous exam(s). ACR Breast Density Category b: There are scattered areas of fibroglandular density. FINDINGS: There are no findings suspicious for malignancy. Images were processed with CAD. IMPRESSION: No mammographic evidence of malignancy. A result letter of this screening mammogram will be mailed directly to the patient. RECOMMENDATION: Screening mammogram in one year. (Code:SM-B-01Y) BI-RADS CATEGORY  1: Negative. Electronically Signed   By: Franki Cabot M.D.   On: 03/08/2015 16:27    Oren Binet, MD  [facility] Pager:[phone]  If 7PM-7AM, please contact night-coverage [email or web address] Password [email or web address] 03/26/2015, 11:58 AM   LOS: 1 day

## 2015-03-26 NOTE — Progress Notes (Signed)
Subjective:  Awaiting stress test.  Remains in sinus rhythm.  No chest pain or shortness of breath.  Objective:  Vital Signs in the last 24 hours: BP 123/60 mmHg  Pulse 98  Temp(Src) 99.1 F (37.3 C) (Oral)  Resp 15  Ht 5\' 2"  (1.575 m)  Wt 70.716 kg (155 lb 14.4 oz)  BMI 28.51 kg/m2  SpO2 96%  LMP  (LMP Unknown)  Physical Exam: Soft-spoken black female in no acute distress Lungs:  Clear Cardiac:  Regular rhythm, normal S1 and S2, no S3 Extremities:  No edema present, dialysis fistula present   Intake/Output from previous day: 12/02 0701 - 12/03 0700 In: 397.2 [P.O.:280; I.V.:117.2] Out: 0   Weight Filed Weights   03/25/15 1836 03/26/15 0614  Weight: 70.4 kg (155 lb 3.3 oz) 70.716 kg (155 lb 14.4 oz)    Lab Results: Basic Metabolic Panel:  Recent Labs  03/25/15 1125 03/26/15 0236  NA 137 135  K 4.5 4.4  CL 94* 96*  CO2 30 25  GLUCOSE 160* 131*  BUN 26* 38*  CREATININE 7.14* 8.75*   CBC:  Recent Labs  03/25/15 1125 03/26/15 0236  WBC 10.0 8.2  NEUTROABS 7.6  --   HGB 13.9 11.1*  HCT 42.7 34.0*  MCV 93.4 92.4  PLT 197 192   Cardiac Panel (last 3 results)  Recent Labs  03/25/15 1725 03/25/15 2059 03/26/15 0236  TROPONINI 0.04* 0.04* 0.05*    Telemetry: Normal sinus rhythm without recurrence of atrial fibrillation  Assessment/Plan:  1.  Paroxysmal atrial fibrillation currently in sinus rhythm 2.  End-stage renal disease on hemodialysis 4.  Severe peripheral vascular disease 5.  Hypertension controlled 6.  Diabetes mellitus with multiple complications  Recommendations:  Continues on heparin.  Transitioning to warfarin at this time.  Plan for nuclear stress test later on this morning.  Echo pending.     Kerry Hough  MD [facility]  03/26/2015, 8:21 AM

## 2015-03-27 LAB — CBC
HCT: 33.6 % — ABNORMAL LOW (ref 36.0–46.0)
HEMOGLOBIN: 10.9 g/dL — AB (ref 12.0–15.0)
MCH: 29.9 pg (ref 26.0–34.0)
MCHC: 32.4 g/dL (ref 30.0–36.0)
MCV: 92.1 fL (ref 78.0–100.0)
Platelets: 241 10*3/uL (ref 150–400)
RBC: 3.65 MIL/uL — AB (ref 3.87–5.11)
RDW: 15.3 % (ref 11.5–15.5)
WBC: 7.3 10*3/uL (ref 4.0–10.5)

## 2015-03-27 LAB — PROTIME-INR
INR: 1.37 (ref 0.00–1.49)
Prothrombin Time: 17 s — ABNORMAL HIGH (ref 11.6–15.2)

## 2015-03-27 LAB — GLUCOSE, CAPILLARY
Glucose-Capillary: 141 mg/dL — ABNORMAL HIGH (ref 65–99)
Glucose-Capillary: 193 mg/dL — ABNORMAL HIGH (ref 65–99)

## 2015-03-27 LAB — HEPARIN LEVEL (UNFRACTIONATED): Heparin Unfractionated: 0.42 IU/mL (ref 0.30–0.70)

## 2015-03-27 MED ORDER — WARFARIN SODIUM 5 MG PO TABS: 7.5000 mg | ORAL_TABLET | Freq: Every day | ORAL | Status: DC

## 2015-03-27 MED ORDER — WARFARIN SODIUM 7.5 MG PO TABS
7.5000 mg | ORAL_TABLET | Freq: Once | ORAL | Status: DC
  Filled 2015-03-27: qty 1

## 2015-03-27 MED ORDER — WARFARIN - PHARMACIST DOSING INPATIENT: Freq: Every day | Status: DC

## 2015-03-27 MED ORDER — DILTIAZEM HCL ER COATED BEADS 120 MG PO CP24: 120.0000 mg | ORAL_CAPSULE | Freq: Every day | ORAL | Status: DC

## 2015-03-27 NOTE — Discharge Summary (Signed)
PATIENT DETAILS Name: [name] Age: 54 y.o. Sex: female Date of Birth: 1961/03/30 MRN: [id number]. Admitting Physician: Mechele Claude, DO [id number] [place], NP  Admit Date: 03/25/2015 Discharge date: 03/27/2015  Recommendations for Outpatient Follow-up:  1. New medication: Coumadin-started on 12/3-please check INR in the next 2-3 days and adjust dosing of Coumadin accordingly.  2. New medication: Cardizem. 3. Please ensure INR check in the next 2-3 days-adjust Coumadin dosage accordingly.  PRIMARY DISCHARGE DIAGNOSIS:  Principal Problem:   Atrial fibrillation with RVR (HCC) Active Problems:   Hyperparathyroidism, secondary (HCC)   Peripheral vascular disease, unspecified ([place])   End stage renal disease on dialysis ([place])   DM (diabetes mellitus) type II controlled peripheral vascular disorder (HCC)   Ulcer of lower limb (HCC)   Chest pain   Atrial fibrillation with rapid ventricular response ([place])      PAST MEDICAL HISTORY: Past Medical History  Diagnosis Date  . ESRD (end stage renal disease) ([place])   . Hypertension   . CVA (cerebral infarction)     right parietal 05/19/2000  . Vaginal bleeding   . Hyperparathyroidism   . Pneumonia   . Stroke ([place])     2002,no residual  . Peripheral vascular disease ([place])   . GERD (gastroesophageal reflux disease)   . Anemia   . DM (diabetes mellitus) type II controlled peripheral vascular disorder ([place])   . Slip/trip w/o falling due to stepping on object, subs July  2014    DISCHARGE MEDICATIONS: Current Discharge Medication List    START taking these medications   Details  diltiazem (CARDIZEM CD) 120 MG 24 hr capsule Take 1 capsule (120 mg total) by mouth daily. Qty: 60 capsule, Refills: 0    warfarin (COUMADIN) 5 MG tablet Take 1.5 tablets (7.5 mg total) by mouth daily at 6 PM. Qty: 60 tablet, Refills: 0      CONTINUE these medications which have NOT CHANGED   Details  acetaminophen (TYLENOL) 325 MG  tablet Take 650 mg by mouth every Monday, Wednesday, and Friday. With dialysis    CALCIUM-VITAMIN D PO Take 500 mg by mouth 3 (three) times daily with meals.     Clopidogrel Bisulfate (PLAVIX PO) Take 75 mg by mouth daily.     Cyanocobalamin (VITAMIN B-12 PO) Take 1 tablet by mouth 4 (four) times a week. Non dialysis days. Tues, Thurs, Sat and Sunday    diazepam (VALIUM) 5 MG tablet Take 5 mg by mouth every 8 (eight) hours as needed for muscle spasms.  Refills: 0    diphenhydrAMINE (BENADRYL) 25 MG tablet Take 25 mg by mouth every 6 (six) hours as needed (Muscle spasm in feet).    glipiZIDE (GLUCOTROL) 10 MG tablet Take 0.5 tablets (5 mg total) by mouth daily. Qty: 45 tablet, Refills: 3   Associated Diagnoses: DM (diabetes mellitus) type II controlled peripheral vascular disorder (HCC)    multivitamin (RENA-VIT) TABS tablet Take 1 tablet by mouth daily.    neomycin-bacitracin-polymyxin (NEOSPORIN) [phone] ointment Apply 1 application topically daily. Apply to foot    traMADol (ULTRAM) 50 MG tablet Take 50 mg by mouth 2 (two) times daily as needed for moderate pain (for foot pain). For foot pain Refills: 0    vitamin C (ASCORBIC ACID) 500 MG tablet Take 500 mg by mouth daily.    vitamin E 400 UNIT capsule Take 400 Units by mouth daily.    polyethylene glycol (MIRALAX / GLYCOLAX) packet Take 17 g by mouth daily. Qty: 14  each, Refills: 0      STOP taking these medications     amLODipine (NORVASC) 10 MG tablet      aspirin 81 MG tablet         ALLERGIES:   Allergies  Allergen Reactions  . Doxycycline Itching  . Peroxyl [Hydrogen Peroxide] Hives and Rash    BRIEF HPI:  See H&P, Labs, Consult and Test reports for all details in brief, patient is a 54 y.o. female, with ESRD (HD M/W/F), PVD, HTN, HLD, DM, and a non healing wound on her left heal-was found to be in atrial fibrillation with RVR at her hemodialysis center. She was subsequently transferred for further  evaluation and treatment  CONSULTATIONS:   cardiology  PERTINENT RADIOLOGIC STUDIES: Dg Chest 2 View  03/25/2015  CLINICAL DATA:  Mid chest pain today, hypertension, diabetes mellitus, end-stage renal disease, history stroke EXAM: CHEST  2 VIEW COMPARISON:  08/16/2014 FINDINGS: Enlargement of cardiac silhouette. Mediastinal contours and pulmonary vascularity normal. Resolution of RIGHT pleural effusion and basilar atelectasis seen on previous exam. No acute infiltrate, pleural effusion or pneumothorax. Osseous structures unremarkable. IMPRESSION: No acute abnormalities. Enlargement of cardiac silhouette. Electronically Signed   By: Lavonia Dana M.D.   On: 03/25/2015 12:18   Nm Myocar Multi W/spect W/wall Motion / Ef  03/26/2015  CLINICAL DATA:  Chest pain. History of atrial fibrillation, diabetes and hypertension. EXAM: MYOCARDIAL IMAGING WITH SPECT (REST AND PHARMACOLOGIC-STRESS) GATED LEFT VENTRICULAR WALL MOTION STUDY LEFT VENTRICULAR EJECTION FRACTION TECHNIQUE: Standard myocardial SPECT imaging was performed after resting intravenous injection of 10 mCi Tc-50m sestamibi. Subsequently, intravenous infusion of Lexiscan was performed under the supervision of the Cardiology staff. At peak effect of the drug, 30 mCi Tc-23m sestamibi was injected intravenously and standard myocardial SPECT imaging was performed. Quantitative gated imaging was also performed to evaluate left ventricular wall motion, and estimate left ventricular ejection fraction. COMPARISON:  Radiographs 03/25/2015.  Chest CT 08/16/2014. FINDINGS: Perfusion: No decreased activity in the left ventricle on stress imaging to suggest reversible ischemia or infarction. Wall Motion: There is septal dyskinesia. The left ventricular activity is otherwise normal. Left Ventricular Ejection Fraction: 55 % End diastolic volume 53 ml End systolic volume 24 ml IMPRESSION: 1. No reversible ischemia or infarction. 2. Septal dyskinesia. Otherwise normal left  ventricular wall motion. 3. Left ventricular ejection fraction 55% 4. Low-risk stress test findings*. *2012 Appropriate Use Criteria for Coronary Revascularization Focused Update: J Am Coll Cardiol. [id number]. http://content.airportbarriers.com.aspx?articleid=[email or web address] Electronically Signed   By: Richardean Sale M.D.   On: 03/26/2015 14:19   Mm Digital Screening Bilateral  03/08/2015  CLINICAL DATA:  Screening. EXAM: DIGITAL SCREENING BILATERAL MAMMOGRAM WITH CAD COMPARISON:  Previous exam(s). ACR Breast Density Category b: There are scattered areas of fibroglandular density. FINDINGS: There are no findings suspicious for malignancy. Images were processed with CAD. IMPRESSION: No mammographic evidence of malignancy. A result letter of this screening mammogram will be mailed directly to the patient. RECOMMENDATION: Screening mammogram in one year. (Code:SM-B-01Y) BI-RADS CATEGORY  1: Negative. Electronically Signed   By: Franki Cabot M.D.   On: 03/08/2015 16:27     PERTINENT LAB RESULTS: CBC:  Recent Labs  03/26/15 0236 03/27/15 0620  WBC 8.2 7.3  HGB 11.1* 10.9*  HCT 34.0* 33.6*  PLT 192 241   CMET CMP     Component Value Date/Time   NA 135 03/26/2015 0236   K 4.4 03/26/2015 0236   CL 96* 03/26/2015 0236   CO2 25 03/26/2015 0236  GLUCOSE 131* 03/26/2015 0236   BUN 38* 03/26/2015 0236   CREATININE 8.75* 03/26/2015 0236   CALCIUM 8.5* 03/26/2015 0236   PROT 8.8* 08/16/2014 0112   ALBUMIN 2.4* 08/20/2014 0805   AST 22 08/16/2014 0112   ALT 20 08/16/2014 0112   ALKPHOS 90 08/16/2014 0112   BILITOT 0.6 08/16/2014 0112   GFRNONAA 5* 03/26/2015 0236   GFRAA 5* 03/26/2015 0236    GFR Estimated Creatinine Clearance: 6.7 mL/min (by C-G formula based on Cr of 8.75). No results for input(s): LIPASE, AMYLASE in the last 72 hours.  Recent Labs  03/25/15 1725 03/25/15 2059 03/26/15 0236  TROPONINI 0.04* 0.04* 0.05*   Invalid input(s): POCBNP No results for input(s):  DDIMER in the last 72 hours.  Recent Labs  03/25/15 1725  HGBA1C 7.4*   No results for input(s): CHOL, HDL, LDLCALC, TRIG, CHOLHDL, LDLDIRECT in the last 72 hours.  Recent Labs  03/25/15 1725  TSH 0.298*   No results for input(s): VITAMINB12, FOLATE, FERRITIN, TIBC, IRON, RETICCTPCT in the last 72 hours. Coags:  Recent Labs  03/26/15 0018 03/27/15 0620  INR 1.34 1.37   Microbiology: Recent Results (from the past 240 hour(s))  Culture, blood (routine x 2)     Status: None (Preliminary result)   Collection Time: 03/25/15  5:25 PM  Result Value Ref Range Status   Specimen Description BLOOD RIGHT ARM  Final   Special Requests BOTTLES DRAWN AEROBIC ONLY 5CC  Final   Culture NO GROWTH < 24 HOURS  Final   Report Status PENDING  Incomplete  Culture, blood (routine x 2)     Status: None (Preliminary result)   Collection Time: 03/25/15  5:38 PM  Result Value Ref Range Status   Specimen Description BLOOD RIGHT HAND  Final   Special Requests BOTTLES DRAWN AEROBIC ONLY 5CC  Final   Culture NO GROWTH < 24 HOURS  Final   Report Status PENDING  Incomplete  MRSA PCR Screening     Status: Abnormal   Collection Time: 03/25/15  6:01 PM  Result Value Ref Range Status   MRSA by PCR POSITIVE (A) NEGATIVE Final    Comment:        The GeneXpert MRSA Assay (FDA approved for NASAL specimens only), is one component of a comprehensive MRSA colonization surveillance program. It is not intended to diagnose MRSA infection nor to guide or monitor treatment for MRSA infections. RESULT CALLED TO, READ BACK BY AND VERIFIED WITH: A.EVANS,RN AT 1920 BY L.PITT 03/25/15      BRIEF HOSPITAL COURSE:  Atrial fibrillation with RVR: Back in sinus rhythm, required Cardizem infusion on admission. Started on IV heparin with overlapping Coumadin. CHA2DS2-Vasc score 6. Spoke with cardiology-Dr. Arlyce Harman to discharge on Coumadin without any overlap, patient was asked to follow-up at the Coumadin clinic in  [facility] or at the [facility] for an INR check in the next 2 days for further adjustment of Coumadin dosage. Patient also counseled regarding importance of prompt medical attention if severe headache/strokelike symptoms/melena/hematochezia/hematemesis/MVA/head trauma/major fall- as she is now on anticoagulation  Active Problems: Chest pain: Seen by cardiology, underwent nuclear stress test that was negative for ischemia.. Troponins only minimally elevated-not consistent with ACS-likely secondary to ESRD.   Peripheral vascular disease with nonhealing ulcer in the left foot (s/p left transmetatarsal amputation): Continue Plavix, but will discontinue aspirin as on Coumadin. Reviewed outpatient vascular note-not a candidate for revascularization-BKA has been recommended-which the patient has refused. Continue outpatient follow-up with [facility], and  home health services on discharge.  ESRD: Nephrology following on HD M/W/F  Type 2 diabetes: CBGs stable with SSI-resume glipizide discharge.  History of CVA: Nonfocal exam.  TODAY-DAY OF DISCHARGE:  Subjective:   [name] today has no headache,no chest abdominal pain,no new weakness tingling or numbness, feels much better wants to go home today.  Objective:   Blood pressure 132/57, pulse 86, temperature 98.3 F (36.8 C), temperature source Oral, resp. rate 18, height 5\' 2"  (1.575 m), weight 66.588 kg (146 lb 12.8 oz), SpO2 99 %.  Intake/Output Summary (Last 24 hours) at 03/27/15 1112 Last data filed at 03/27/15 0834  Gross per 24 hour  Intake 1133.38 ml  Output      0 ml  Net 1133.38 ml   Filed Weights   03/25/15 1836 03/26/15 0614 03/27/15 0603  Weight: 70.4 kg (155 lb 3.3 oz) 70.716 kg (155 lb 14.4 oz) 66.588 kg (146 lb 12.8 oz)    Exam Awake Alert, Oriented *3, No new F.N deficits, Normal affect [place].AT,PERRAL Supple Neck,No JVD, No cervical lymphadenopathy appriciated.  Symmetrical Chest wall movement, Good air movement  bilaterally, CTAB RRR,No Gallops,Rubs or new Murmurs, No Parasternal Heave +ve B.Sounds, Abd Soft, Non tender, No organomegaly appriciated, No rebound -guarding or rigidity. No Cyanosis, Clubbing or edema, No new Rash or bruise  DISCHARGE CONDITION: Stable  DISPOSITION: Home with home health services  DISCHARGE INSTRUCTIONS:    Activity:  As tolerated with Full fall precautions use walker/cane & assistance as needed  You are on blood thinner-Coumadin-please seek immediate medical attention if you have headache/strokelike symptoms/black stools/bloody stools/vomited blood/falls/major trauma/motor vehicle accident.  Please stop taking aspirin-but continue Plavix  Please make a appointment with your primary care practitioner or the Coumadin clinic in 2 days for a INR check.  Please get a complete blood count and chemistry panel checked by your Primary MD at your next visit, and again as instructed by your Primary MD.  Get Medicines reviewed and adjusted: Please take all your medications with you for your next visit with your Primary MD  Please request your Primary MD to go over all hospital tests and procedure/radiological results at the follow up, please ask your Primary MD to get all Hospital records sent to his/her office.  If you experience worsening of your admission symptoms, develop shortness of breath, life threatening emergency, suicidal or homicidal thoughts you must seek medical attention immediately by calling 911 or calling your MD immediately  if symptoms less severe.  You must read complete instructions/literature along with all the possible adverse reactions/side effects for all the Medicines you take and that have been prescribed to you. Take any new Medicines after you have completely understood and accpet all the possible adverse reactions/side effects.   Do not drive when taking Pain medications.   Do not take more than prescribed Pain, Sleep and Anxiety  Medications  Special Instructions: If you have smoked or chewed Tobacco  in the last 2 yrs please stop smoking, stop any regular Alcohol  and or any Recreational drug use.  Wear Seat belts while driving.  Please note  You were cared for by a hospitalist during your hospital stay. Once you are discharged, your primary care physician will handle any further medical issues. Please note that NO REFILLS for any discharge medications will be authorized once you are discharged, as it is imperative that you return to your primary care physician (or establish a relationship with a primary care physician if you do not  have one) for your aftercare needs so that they can reassess your need for medications and monitor your lab values.   Diet recommendation: Diabetic Diet Heart Healthy diet  Discharge Instructions    Call MD for:  difficulty breathing, headache or visual disturbances    Complete by:  As directed      Call MD for:  persistant nausea and vomiting    Complete by:  As directed      Call MD for:  severe uncontrolled pain    Complete by:  As directed      Diet - low sodium heart healthy    Complete by:  As directed      Diet Carb Modified    Complete by:  As directed      Increase activity slowly    Complete by:  As directed            Follow-up Information    Follow up with Lance Bosch, NP. Schedule an appointment as soon as possible for a visit in 3 days.   Specialty:  Internal Medicine   Why:  Hospital follow up-for INR check   Contact information:   [place] [address] [phone]       Follow up with [facility].   Specialty:  Cardiology   Why:  please call office on 12/5-for coumadin clinic-and please set up appointment in 2 days   Contact information:   [address], [place] [phone]      Please follow up.   Why:  keep your regular schedule   Contact information:    hemodialysis Center     Total Time spent on discharge equals  45 minutes.  SignedOren Binet 03/27/2015 11:12 AM

## 2015-03-27 NOTE — Discharge Instructions (Signed)
Follow with Primary MD  Lance Bosch, NP, coumadin clinic and HD center and your usual schedule.   You are on blood thinner-Coumadin-please seek immediate medical attention if you have headache/strokelike symptoms/black stools/bloody stools/vomited blood/falls/major trauma/motor vehicle accident.  Please stop taking aspirin-but continue Plavix  Please make a appointment with your primary care practitioner or the Coumadin clinic in 2 days for a INR check.  Please get a complete blood count and chemistry panel checked by your Primary MD at your next visit, and again as instructed by your Primary MD.  Get Medicines reviewed and adjusted. Please take all your medications with you for your next visit with your Primary MD  Please request your Primary MD to go over all hospital tests and procedure/radiological results at the follow up, please ask your Primary MD to get all Hospital records sent to his/her office.  If you experience worsening of your admission symptoms, develop shortness of breath, life threatening emergency, suicidal or homicidal thoughts you must seek medical attention immediately by calling 911 or calling your MD immediately  if symptoms less severe.  You must read complete instructions/literature along with all the possible adverse reactions/side effects for all the Medicines you take and that have been prescribed to you. Take any new Medicines after you have completely understood and accpet all the possible adverse reactions/side effects.   Do not drive when taking Pain medications or sleeping medications (Benzodaizepines)  Do not take more than prescribed Pain, Sleep and Anxiety Medications  Special Instructions: If you have smoked or chewed Tobacco  in the last 2 yrs please stop smoking, stop any regular Alcohol  and or any Recreational drug use.  Wear Seat belts while driving.  Please note  You were cared for by a hospitalist during your hospital stay. Once you are  discharged, your primary care physician will handle any further medical issues. Please note that NO REFILLS for any discharge medications will be authorized once you are discharged, as it is imperative that you return to your primary care physician (or establish a relationship with a primary care physician if you do not have one) for your aftercare needs so that they can reassess your need for medications and monitor your lab values.

## 2015-03-27 NOTE — Evaluation (Addendum)
Occupational Therapy Evaluation Patient Details Name: Sariyah Zwiers MRN: [id number] DOB: 1961-03-14 Today's Date: 03/27/2015    History of Present Illness Pt is a 54 y.o. F from home found to be in afib with RVR during dialysis.  Pt has chronic non healing wound on her Lt heel and has refused suggested amputation.  Pt's PMH includes CVA (Rt parietal), anemia.   Clinical Impression   Pt admitted with above. Education provided in session and pt planning to leave today. Pt verbalized understanding of information covered in session. OT signing off.    Follow Up Recommendations  No OT follow up;Supervision - Intermittent    Equipment Recommendations  None recommended by OT    Recommendations for Other Services       Precautions / Restrictions Precautions Precautions: Fall Precaution Comments: chronic wound Lt heel Restrictions Weight Bearing Restrictions: No      Mobility Bed Mobility               General bed mobility comments: sitting EOB  Transfers Overall transfer level: Needs assistance   Transfers: Sit to/from Stand Sit to Stand:  (Min guard-Supervision)         General transfer comment: Pt dizzy with initial stand from bed. cue for hand placement.     Balance    No LOB in session. Pt able to stand and perform functional task with no LOB.                                        ADL Overall ADL's : Needs assistance/impaired     Grooming: Wash/dry face;Applying deodorant;Set up;Supervision/safety;Standing               Lower Body Dressing: Supervision/safety;Set up;Sit to/from stand   Toilet Transfer: Ambulation;RW (min guard-supervision)           Functional mobility during ADLs: Rolling walker (Min guard-supervision) General ADL Comments: Educated on safety such as sitting for LB ADLs and recommended pt have someone with her for shower transfer and bathing. Educated on use of bag on walker and safe footwear. Pt reports  she has a string in her apartment that calls if she has emergencys.     Vision     Perception     Praxis      Pertinent Vitals/Pain Pain Assessment: 0-10 Pain Score: 5  Pain Location: Lt foot Pain Intervention(s): Monitored during session     Hand Dominance     Extremity/Trunk Assessment Upper Extremity Assessment Upper Extremity Assessment: Overall WFL for tasks assessed   Lower Extremity Assessment Lower Extremity Assessment: Defer to PT evaluation       Communication Communication Communication: No difficulties   Cognition Arousal/Alertness: Awake/alert Behavior During Therapy: Flat affect Overall Cognitive Status: No family/caregiver present to determine baseline cognitive functioning Area of Impairment: problem solving         Problem Solving: Slow processing         General Comments       Exercises       Shoulder Instructions      Home Living Family/patient expects to be discharged to:: Private residence Living Arrangements: Alone Available Help at Discharge: Personal care attendant;Available PRN/intermittently Type of Home: Apartment Home Access: Stairs to enter Entrance Stairs-Number of Steps: 3 Entrance Stairs-Rails: Left Home Layout: One level     Bathroom Shower/Tub: Walk-in shower         Home  Equipment: Gilford Rile - 2 wheels;Walker - 4 wheels;Bedside commode;Shower seat          Prior Functioning/Environment Level of Independence: Needs assistance    ADL's / Homemaking Assistance Needed: Has aide 3x/wk to assist with cleaning and cooking. Sounds like she mostly sponge bathes and sometimes uses shower (reports she sometimes has someone there when she gets in shower but not all the time)   Comments: RW during ambulation. HHRN 2x/wk for dressing change of Lt heel.      OT Diagnosis: Generalized weakness   OT Problem List:     OT Treatment/Interventions:      OT Goals(Current goals can be found in the care plan section)    OT  Frequency:     Barriers to D/C:            Co-evaluation              End of Session Equipment Utilized During Treatment: Gait belt;Rolling walker  Nurse communication: mobility status  Activity Tolerance: Patient tolerated treatment well Patient left: in bed;with call bell/phone within reach;with bed alarm set   Time: [id number] OT Time Calculation (min): 16 min Charges:  OT General Charges $OT Visit: 1 Procedure OT Evaluation $Initial OT Evaluation Tier I: 1 Procedure G-CodesBenito Mccreedy OTR/L [id number] 03/27/2015, 12:32 PM

## 2015-03-27 NOTE — Progress Notes (Signed)
ANTICOAGULATION CONSULT NOTE - Follow-up Consult  Pharmacy Consult for Heparin and Warfarin Indication: atrial fibrillation  Allergies  Allergen Reactions  . Doxycycline Itching  . Peroxyl [Hydrogen Peroxide] Hives and Rash    Patient Measurements: Height: 5\' 2"  (157.5 cm) Weight: 146 lb 12.8 oz (66.588 kg) IBW/kg (Calculated) : 50.1 Heparin Dosing Weight: 65 kg   Vital Signs: Temp: 98.3 F (36.8 C) (12/04 0603) Temp Source: Oral (12/04 0603) BP: 132/57 mmHg (12/04 0603) Pulse Rate: 86 (12/04 0603)  Labs:  Recent Labs  03/25/15 1125 03/25/15 1725 03/25/15 2059  03/26/15 0018 03/26/15 0236 03/26/15 1300 03/26/15 2134 03/27/15 0620  HGB 13.9  --   --   --   --  11.1*  --   --  10.9*  HCT 42.7  --   --   --   --  34.0*  --   --  33.6*  PLT 197  --   --   --   --  192  --   --  241  LABPROT  --   --   --   --  16.7*  --   --   --  17.0*  INR  --   --   --   --  1.34  --   --   --  1.37  HEPARINUNFRC  --   --   --   < > 0.26*  --  0.28* 0.35 0.42  CREATININE 7.14*  --   --   --   --  8.75*  --   --   --   TROPONINI <0.03 0.04* 0.04*  --   --  0.05*  --   --   --   < > = values in this interval not displayed.  Estimated Creatinine Clearance: 6.7 mL/min (by C-G formula based on Cr of 8.75).   Assessment: 37 YOF who presented with chest pain and new afib with RVR.   Heparin is therapeutic on 1050 units/hr   INR 1.37 s/p 7.5 mg warfarin x 1 dose (missed dose 12/3)  Goal of Therapy:  Heparin level 0.3-0.7 units/ml  INR 2-3  Monitor platelets by anticoagulation protocol: Yes   Plan:  Continue heparin at 1050 units/hr Warfarin 7.5 mg x 1 tonight Daily heparin level, INR, and CBC Monitor for s/sx of bleeding  [employer], Pharm.D., BCPS Clinical Pharmacist Pager [phone] 03/27/2015 8:35 AM

## 2015-03-27 NOTE — Progress Notes (Signed)
Subjective:  Stress test yesterday did not show any significant ischemia.  Echo showed LVH but normal systolic function.  Remains in sinus rhythm overnight.  No shortness of breath or chest pain.    Objective:  Vital Signs in the last 24 hours: BP 132/57 mmHg  Pulse 86  Temp(Src) 98.3 F (36.8 C) (Oral)  Resp 18  Ht 5\' 2"  (1.575 m)  Wt 66.588 kg (146 lb 12.8 oz)  BMI 26.84 kg/m2  SpO2 99%  LMP  (LMP Unknown)  Physical Exam: Soft-spoken black female in no acute distress Lungs:  Clear Cardiac:  Regular rhythm, normal S1 and S2, no S3 Extremities:  No edema present, dialysis fistula present   Intake/Output from previous day: 12/03 0701 - 12/04 0700 In: 893.4 [P.O.:480; I.V.:293.4; IV Piggyback:120] Out: -   Weight Filed Weights   03/25/15 1836 03/26/15 0614 03/27/15 0603  Weight: 70.4 kg (155 lb 3.3 oz) 70.716 kg (155 lb 14.4 oz) 66.588 kg (146 lb 12.8 oz)    Lab Results: Basic Metabolic Panel:  Recent Labs  03/25/15 1125 03/26/15 0236  NA 137 135  K 4.5 4.4  CL 94* 96*  CO2 30 25  GLUCOSE 160* 131*  BUN 26* 38*  CREATININE 7.14* 8.75*   CBC:  Recent Labs  03/25/15 1125 03/26/15 0236 03/27/15 0620  WBC 10.0 8.2 7.3  NEUTROABS 7.6  --   --   HGB 13.9 11.1* 10.9*  HCT 42.7 34.0* 33.6*  MCV 93.4 92.4 92.1  PLT 197 192 241   Cardiac Panel (last 3 results)  Recent Labs  03/25/15 1725 03/25/15 2059 03/26/15 0236  TROPONINI 0.04* 0.04* 0.05*   Lab Results  Component Value Date   INR 1.37 03/27/2015   INR 1.34 03/26/2015   INR 1.22 08/16/2014    Telemetry: Normal sinus rhythm without recurrence of atrial fibrillation  Assessment/Plan:  1.  Paroxysmal atrial fibrillation currently in sinus rhythm 2.  End-stage renal disease on hemodialysis 4.  Severe peripheral vascular disease 5.  Hypertension controlled 6.  Diabetes mellitus with multiple complications  Recommendations:  Continues on heparin.  Transitioning to warfarin at this time.   Aspirin discontinued yesterday.  Continue Plavix because of vascular disease.  INR still not therapeutic.     Kerry Hough  MD [facility]  03/27/2015, 7:32 AM

## 2015-03-28 DIAGNOSIS — N2581 Secondary hyperparathyroidism of renal origin: Secondary | ICD-10-CM | POA: Diagnosis not present

## 2015-03-28 DIAGNOSIS — N186 End stage renal disease: Secondary | ICD-10-CM | POA: Diagnosis not present

## 2015-03-28 DIAGNOSIS — E1129 Type 2 diabetes mellitus with other diabetic kidney complication: Secondary | ICD-10-CM | POA: Diagnosis not present

## 2015-03-30 DIAGNOSIS — N2581 Secondary hyperparathyroidism of renal origin: Secondary | ICD-10-CM | POA: Diagnosis not present

## 2015-03-30 DIAGNOSIS — N186 End stage renal disease: Secondary | ICD-10-CM | POA: Diagnosis not present

## 2015-03-30 DIAGNOSIS — E1129 Type 2 diabetes mellitus with other diabetic kidney complication: Secondary | ICD-10-CM | POA: Diagnosis not present

## 2015-03-30 LAB — CULTURE, BLOOD (ROUTINE X 2)
CULTURE: NO GROWTH
Culture: NO GROWTH

## 2015-03-30 NOTE — Progress Notes (Signed)
Received telephone call from patient asking about her home medications.  States she was on Norvasc prior to admission and was not sure why the medication was stopped at discharge.  Stated she had taken medication last night and now today she was not able to tolerate hemodialysis due to her BP being low.  Explained to patient, the norvasc was discontinued due to the start of Cardizem and taking both the medications could lower her blood pressure.  Patient instructed to only take the medications listed on her discharge papers and to not take any more norvasc.  Patient stated her understanding.  Sanda Linger

## 2015-03-31 ENCOUNTER — Ambulatory Visit (INDEPENDENT_AMBULATORY_CARE_PROVIDER_SITE_OTHER): Payer: Medicare Other | Admitting: Cardiovascular Disease

## 2015-03-31 DIAGNOSIS — E1151 Type 2 diabetes mellitus with diabetic peripheral angiopathy without gangrene: Secondary | ICD-10-CM | POA: Diagnosis not present

## 2015-03-31 DIAGNOSIS — B961 Klebsiella pneumoniae [K. pneumoniae] as the cause of diseases classified elsewhere: Secondary | ICD-10-CM | POA: Diagnosis not present

## 2015-03-31 DIAGNOSIS — I120 Hypertensive chronic kidney disease with stage 5 chronic kidney disease or end stage renal disease: Secondary | ICD-10-CM | POA: Diagnosis not present

## 2015-03-31 DIAGNOSIS — E11621 Type 2 diabetes mellitus with foot ulcer: Secondary | ICD-10-CM | POA: Diagnosis not present

## 2015-03-31 DIAGNOSIS — L97422 Non-pressure chronic ulcer of left heel and midfoot with fat layer exposed: Secondary | ICD-10-CM | POA: Diagnosis not present

## 2015-03-31 DIAGNOSIS — B9562 Methicillin resistant Staphylococcus aureus infection as the cause of diseases classified elsewhere: Secondary | ICD-10-CM | POA: Diagnosis not present

## 2015-03-31 DIAGNOSIS — I4891 Unspecified atrial fibrillation: Secondary | ICD-10-CM

## 2015-03-31 LAB — POCT INR: INR: 3.5

## 2015-04-01 DIAGNOSIS — E1129 Type 2 diabetes mellitus with other diabetic kidney complication: Secondary | ICD-10-CM | POA: Diagnosis not present

## 2015-04-01 DIAGNOSIS — N186 End stage renal disease: Secondary | ICD-10-CM | POA: Diagnosis not present

## 2015-04-01 DIAGNOSIS — N2581 Secondary hyperparathyroidism of renal origin: Secondary | ICD-10-CM | POA: Diagnosis not present

## 2015-04-04 DIAGNOSIS — N186 End stage renal disease: Secondary | ICD-10-CM | POA: Diagnosis not present

## 2015-04-04 DIAGNOSIS — N2581 Secondary hyperparathyroidism of renal origin: Secondary | ICD-10-CM | POA: Diagnosis not present

## 2015-04-04 DIAGNOSIS — E1129 Type 2 diabetes mellitus with other diabetic kidney complication: Secondary | ICD-10-CM | POA: Diagnosis not present

## 2015-04-05 ENCOUNTER — Ambulatory Visit (INDEPENDENT_AMBULATORY_CARE_PROVIDER_SITE_OTHER): Payer: Medicare Other | Admitting: Pharmacist Clinician (PhC)/ Clinical Pharmacy Specialist

## 2015-04-05 DIAGNOSIS — E11621 Type 2 diabetes mellitus with foot ulcer: Secondary | ICD-10-CM | POA: Diagnosis not present

## 2015-04-05 DIAGNOSIS — I120 Hypertensive chronic kidney disease with stage 5 chronic kidney disease or end stage renal disease: Secondary | ICD-10-CM | POA: Diagnosis not present

## 2015-04-05 DIAGNOSIS — B9562 Methicillin resistant Staphylococcus aureus infection as the cause of diseases classified elsewhere: Secondary | ICD-10-CM | POA: Diagnosis not present

## 2015-04-05 DIAGNOSIS — B961 Klebsiella pneumoniae [K. pneumoniae] as the cause of diseases classified elsewhere: Secondary | ICD-10-CM | POA: Diagnosis not present

## 2015-04-05 DIAGNOSIS — E1151 Type 2 diabetes mellitus with diabetic peripheral angiopathy without gangrene: Secondary | ICD-10-CM | POA: Diagnosis not present

## 2015-04-05 DIAGNOSIS — I4891 Unspecified atrial fibrillation: Secondary | ICD-10-CM

## 2015-04-05 DIAGNOSIS — L97422 Non-pressure chronic ulcer of left heel and midfoot with fat layer exposed: Secondary | ICD-10-CM | POA: Diagnosis not present

## 2015-04-05 LAB — POCT INR: INR: 3.1

## 2015-04-06 DIAGNOSIS — E1129 Type 2 diabetes mellitus with other diabetic kidney complication: Secondary | ICD-10-CM | POA: Diagnosis not present

## 2015-04-06 DIAGNOSIS — N186 End stage renal disease: Secondary | ICD-10-CM | POA: Diagnosis not present

## 2015-04-06 DIAGNOSIS — N2581 Secondary hyperparathyroidism of renal origin: Secondary | ICD-10-CM | POA: Diagnosis not present

## 2015-04-07 DIAGNOSIS — E11621 Type 2 diabetes mellitus with foot ulcer: Secondary | ICD-10-CM | POA: Diagnosis not present

## 2015-04-07 DIAGNOSIS — L97422 Non-pressure chronic ulcer of left heel and midfoot with fat layer exposed: Secondary | ICD-10-CM | POA: Diagnosis not present

## 2015-04-07 DIAGNOSIS — E1151 Type 2 diabetes mellitus with diabetic peripheral angiopathy without gangrene: Secondary | ICD-10-CM | POA: Diagnosis not present

## 2015-04-07 DIAGNOSIS — B961 Klebsiella pneumoniae [K. pneumoniae] as the cause of diseases classified elsewhere: Secondary | ICD-10-CM | POA: Diagnosis not present

## 2015-04-07 DIAGNOSIS — I120 Hypertensive chronic kidney disease with stage 5 chronic kidney disease or end stage renal disease: Secondary | ICD-10-CM | POA: Diagnosis not present

## 2015-04-07 DIAGNOSIS — B9562 Methicillin resistant Staphylococcus aureus infection as the cause of diseases classified elsewhere: Secondary | ICD-10-CM | POA: Diagnosis not present

## 2015-04-08 ENCOUNTER — Encounter: Payer: Self-pay | Admitting: Vascular Surgery

## 2015-04-08 DIAGNOSIS — E1129 Type 2 diabetes mellitus with other diabetic kidney complication: Secondary | ICD-10-CM | POA: Diagnosis not present

## 2015-04-08 DIAGNOSIS — N2581 Secondary hyperparathyroidism of renal origin: Secondary | ICD-10-CM | POA: Diagnosis not present

## 2015-04-08 DIAGNOSIS — N186 End stage renal disease: Secondary | ICD-10-CM | POA: Diagnosis not present

## 2015-04-11 ENCOUNTER — Encounter: Payer: Self-pay | Admitting: Physician Assistant

## 2015-04-11 ENCOUNTER — Encounter: Payer: Medicare Other | Admitting: Physician Assistant

## 2015-04-11 DIAGNOSIS — E1129 Type 2 diabetes mellitus with other diabetic kidney complication: Secondary | ICD-10-CM | POA: Diagnosis not present

## 2015-04-11 DIAGNOSIS — N2581 Secondary hyperparathyroidism of renal origin: Secondary | ICD-10-CM | POA: Diagnosis not present

## 2015-04-11 DIAGNOSIS — N186 End stage renal disease: Secondary | ICD-10-CM | POA: Diagnosis not present

## 2015-04-11 NOTE — Progress Notes (Signed)
    Cardiology Office Note   Date:  04/11/2015   ID:  [name], DOB Dec 11, 1960, MRN [id number]  PCP:  Lance Bosch, NP  Cardiologist:  Dr Ellyn Hack (saw in hospital 12/02)  Ameliana Brashear, Suanne Marker, PA-C   No chief complaint on file.   History of Present Illness: [name] is a 54 y.o. female with a history of HTN, DM, CVA 2002, PVD, ESRD, hyperparathyroidism, seen in-hosp 12/02 for afib, RVR and chest pain. D/c on diltiazem and warfarin and was in SR     ECHO: 03/26/2015 - Left ventricle: The cavity size was normal. There was moderate concentric hypertrophy. Systolic function was vigorous. The estimated ejection fraction was in the range of 65% to 70%. Wall motion was normal; there were no regional wall motion abnormalities. Doppler parameters are consistent with abnormal left ventricular relaxation (grade 1 diastolic dysfunction). Doppler parameters are consistent with high ventricular filling pressure. - Mitral valve: Calcified annulus. - Left atrium: The atrium was mildly dilated.  MV 03/26/2015 IMPRESSION: 1. No reversible ischemia or infarction. 2. Septal dyskinesia. Otherwise normal left ventricular wall motion 3. Left ventricular ejection fraction 55% 4. Low-risk stress test findings*.  Recent Labs: 08/16/2014: ALT 20 08/18/2014: Magnesium 2.3 03/25/2015: TSH 0.298* 03/26/2015: BUN 38*; Creatinine, Ser 8.75*; Potassium 4.4; Sodium 135 03/27/2015: Hemoglobin 10.9*; Platelets 241    Lipid Panel    Component Value Date/Time   CHOL 136 10/27/2013 1121   TRIG 74 10/27/2013 1121   HDL 48 10/27/2013 1121   CHOLHDL 2.8 10/27/2013 1121   VLDL 15 10/27/2013 1121   LDLCALC 73 10/27/2013 1121     Wt Readings from Last 3 Encounters:  03/27/15 146 lb 12.8 oz (66.588 kg)  03/23/15 151 lb 12.8 oz (68.856 kg)  02/16/15 155 lb (70.308 kg)

## 2015-04-12 ENCOUNTER — Ambulatory Visit (INDEPENDENT_AMBULATORY_CARE_PROVIDER_SITE_OTHER): Payer: Medicare Other | Admitting: Cardiology

## 2015-04-12 ENCOUNTER — Inpatient Hospital Stay: Payer: Medicare Other | Admitting: Family Medicine

## 2015-04-12 DIAGNOSIS — B961 Klebsiella pneumoniae [K. pneumoniae] as the cause of diseases classified elsewhere: Secondary | ICD-10-CM | POA: Diagnosis not present

## 2015-04-12 DIAGNOSIS — E11621 Type 2 diabetes mellitus with foot ulcer: Secondary | ICD-10-CM | POA: Diagnosis not present

## 2015-04-12 DIAGNOSIS — L97422 Non-pressure chronic ulcer of left heel and midfoot with fat layer exposed: Secondary | ICD-10-CM | POA: Diagnosis not present

## 2015-04-12 DIAGNOSIS — I4891 Unspecified atrial fibrillation: Secondary | ICD-10-CM

## 2015-04-12 DIAGNOSIS — B9562 Methicillin resistant Staphylococcus aureus infection as the cause of diseases classified elsewhere: Secondary | ICD-10-CM | POA: Diagnosis not present

## 2015-04-12 DIAGNOSIS — E1151 Type 2 diabetes mellitus with diabetic peripheral angiopathy without gangrene: Secondary | ICD-10-CM | POA: Diagnosis not present

## 2015-04-12 DIAGNOSIS — I120 Hypertensive chronic kidney disease with stage 5 chronic kidney disease or end stage renal disease: Secondary | ICD-10-CM | POA: Diagnosis not present

## 2015-04-12 LAB — POCT INR: INR: 3.6

## 2015-04-13 ENCOUNTER — Ambulatory Visit (INDEPENDENT_AMBULATORY_CARE_PROVIDER_SITE_OTHER): Payer: Medicare Other | Admitting: Vascular Surgery

## 2015-04-13 ENCOUNTER — Encounter: Payer: Self-pay | Admitting: Vascular Surgery

## 2015-04-13 DIAGNOSIS — I749 Embolism and thrombosis of unspecified artery: Secondary | ICD-10-CM

## 2015-04-13 DIAGNOSIS — N2581 Secondary hyperparathyroidism of renal origin: Secondary | ICD-10-CM | POA: Diagnosis not present

## 2015-04-13 DIAGNOSIS — E1129 Type 2 diabetes mellitus with other diabetic kidney complication: Secondary | ICD-10-CM | POA: Diagnosis not present

## 2015-04-13 DIAGNOSIS — N186 End stage renal disease: Secondary | ICD-10-CM | POA: Diagnosis not present

## 2015-04-13 DIAGNOSIS — I7025 Atherosclerosis of native arteries of other extremities with ulceration: Secondary | ICD-10-CM | POA: Diagnosis not present

## 2015-04-13 NOTE — Progress Notes (Signed)
[facility]  Patient name: [name] MRN: [id number] DOB: Feb 08, 1961 Sex: female  REASON FOR CONSULT: follow up of left heel wound.  HPI: [name] is a 54 y.o. female, with an extensive nonhealing wound of her left heel. She previously underwent atherectomy with angioplasty of the left anterior tibial artery and left popliteal artery with drug coated balloon angioplasty to the left superficial femoral/popliteal artery by Dr. Trula Slade on 01/11/15. She had excellent results from this. She was last seen in the office on 02/16/15 where her left heel ulcer was debrided. She has no further revascularization options. The patient was offered a below-knee-amputation but she was very opposed to this. We also discussed debridement of the wound and placement of a negative pressure dressing which she was against. She's been having home health to do dressing changes and comes in for a 3 week follow up visit.  He denies fever or chills. She has some cramping in her leg but her pain is tolerable.  Past Medical History  Diagnosis Date  . ESRD (end stage renal disease) ([place])   . Hypertension   . CVA (cerebral infarction)     right parietal 05/19/2000  . Vaginal bleeding   . Hyperparathyroidism   . Pneumonia   . Stroke ([place])     2002,no residual  . Peripheral vascular disease ([place])   . GERD (gastroesophageal reflux disease)   . Anemia   . DM (diabetes mellitus) type II controlled peripheral vascular disorder ([place])   . Slip/trip w/o falling due to stepping on object, subs July  2014    Family History  Problem Relation Age of Onset  . Hypertension Father   . Kidney disease Mother     SOCIAL HISTORY: Social History   Social History  . Marital Status: Single    Spouse Name: N/A  . Number of Children: N/A  . Years of Education: N/A   Occupational History  . Not on file.   Social History Main Topics  . Smoking status: Never Smoker   . Smokeless  tobacco: Never Used  . Alcohol Use: No  . Drug Use: No  . Sexual Activity: No   Other Topics Concern  . Not on file   Social History Narrative    Allergies  Allergen Reactions  . Doxycycline Itching  . Peroxyl [Hydrogen Peroxide] Hives and Rash    Current Outpatient Prescriptions  Medication Sig Dispense Refill  . acetaminophen (TYLENOL) 325 MG tablet Take 650 mg by mouth every Monday, Wednesday, and Friday. With dialysis    . CALCIUM-VITAMIN D PO Take 500 mg by mouth 3 (three) times daily with meals.     . Clopidogrel Bisulfate (PLAVIX PO) Take 75 mg by mouth daily.     . Cyanocobalamin (VITAMIN B-12 PO) Take 1 tablet by mouth 4 (four) times a week. Non dialysis days. Tues, Thurs, Sat and Sunday    . diazepam (VALIUM) 5 MG tablet Take 5 mg by mouth every 8 (eight) hours as needed for muscle spasms.   0  . diltiazem (CARDIZEM CD) 120 MG 24 hr capsule Take 1 capsule (120 mg total) by mouth daily. 60 capsule 0  . diphenhydrAMINE (BENADRYL) 25 MG tablet Take 25 mg by mouth every 6 (six) hours as needed (Muscle spasm in feet).    Marland Kitchen glipiZIDE (GLUCOTROL) 10 MG tablet Take 0.5 tablets (5 mg total) by mouth daily. 45 tablet 3  . multivitamin (RENA-VIT) TABS tablet Take 1 tablet by mouth  daily.    . neomycin-bacitracin-polymyxin (NEOSPORIN) [phone] ointment Apply 1 application topically daily. Apply to foot    . polyethylene glycol (MIRALAX / GLYCOLAX) packet Take 17 g by mouth daily. (Patient taking differently: Take 17 g by mouth daily as needed for mild constipation. ) 14 each 0  . traMADol (ULTRAM) 50 MG tablet Take 50 mg by mouth 2 (two) times daily as needed for moderate pain (for foot pain). For foot pain  0  . vitamin C (ASCORBIC ACID) 500 MG tablet Take 500 mg by mouth daily.    . vitamin E 400 UNIT capsule Take 400 Units by mouth daily.    Marland Kitchen warfarin (COUMADIN) 5 MG tablet Take 1.5 tablets (7.5 mg total) by mouth daily at 6 PM. 60 tablet 0   No current facility-administered  medications for this visit.    REVIEW OF SYSTEMS:  [X]  denotes positive finding, [ ]  denotes negative finding Cardiac  Comments:  Chest pain or chest pressure:    Shortness of breath upon exertion:    Short of breath when lying flat:    Irregular heart rhythm:        Vascular    Pain in calf, thigh, or hip brought on by ambulation:    Pain in feet at night that wakes you up from your sleep:     Blood clot in your veins:    Leg swelling:         Pulmonary    Oxygen at home:    Productive cough:     Wheezing:         Neurologic    Sudden weakness in arms or legs:     Sudden numbness in arms or legs:     Sudden onset of difficulty speaking or slurred speech:    Temporary loss of vision in one eye:     Problems with dizziness:         Gastrointestinal    Blood in stool:     Vomited blood:         Genitourinary    Burning when urinating:     Blood in urine:        Psychiatric    Major depression:         Hematologic    Bleeding problems:    Problems with blood clotting too easily:        Skin    Rashes or ulcers: X Left heel      Constitutional    Fever or chills:      PHYSICAL EXAM: Filed Vitals:   04/13/15 1559  BP: 118/72  Pulse: 104  Temp: 97.7 F (36.5 C)  TempSrc: Oral  Height: 5\' 2"  (1.575 m)  Weight: 152 lb (68.947 kg)  SpO2: 100%    GENERAL: The patient is a well-nourished female, in no acute distress. The vital signs are documented above. CARDIAC: There is a regular rate and rhythm.  VASCULAR: She has palpable femoral pulses. I cannot palpate pedal pulses. She has no significant lower extremity swelling. PULMONARY: There is good air exchange bilaterally without wheezing or rales. ABDOMEN: Soft and non-tender with normal pitched bowel sounds.  MUSCULOSKELETAL: There are no major deformities or cyanosis. NEUROLOGIC: No focal weakness or paresthesias are detected. SKIN: The ulcer on her left heel measures 4 cm in diameter by 4-1/2 cm in  diameter. The wrist some granulation tissue but at the base of the wound there is a small amount of exposed bone. PSYCHIATRIC: The patient  has a normal affect.  MEDICAL ISSUES:   NONHEALING LEFT HEEL WOUND: She has no further options for revascularization on the left. Given the exposed bone I think the best approach would be a primary below-the-knee amputation. She will not agree to this. I think the only alternative would be to debride the wound and place a negative pressure dressing with a small chance of success. She is now agreeable to this. He has been started on Coumadin for atrial fibrillation. We will have to hold this for 3-4 days preoperatively. She dialyzes on Monday Wednesdays and Fridays and we'll schedule this on a nondialysis day. She has been scheduled for 04/26/2015 which is a Tuesday. She'll need to be admitted and then hopefully we can make arrangements for her to have the negative pressure dressing continued at home.   Deitra Mayo [facility] [place]: [phone]

## 2015-04-14 ENCOUNTER — Telehealth: Payer: Self-pay

## 2015-04-14 ENCOUNTER — Telehealth: Payer: Self-pay | Admitting: Cardiology

## 2015-04-14 ENCOUNTER — Other Ambulatory Visit: Payer: Self-pay

## 2015-04-14 ENCOUNTER — Telehealth: Payer: Self-pay | Admitting: *Deleted

## 2015-04-14 DIAGNOSIS — B961 Klebsiella pneumoniae [K. pneumoniae] as the cause of diseases classified elsewhere: Secondary | ICD-10-CM | POA: Diagnosis not present

## 2015-04-14 DIAGNOSIS — E1151 Type 2 diabetes mellitus with diabetic peripheral angiopathy without gangrene: Secondary | ICD-10-CM | POA: Diagnosis not present

## 2015-04-14 DIAGNOSIS — I120 Hypertensive chronic kidney disease with stage 5 chronic kidney disease or end stage renal disease: Secondary | ICD-10-CM | POA: Diagnosis not present

## 2015-04-14 DIAGNOSIS — L97422 Non-pressure chronic ulcer of left heel and midfoot with fat layer exposed: Secondary | ICD-10-CM | POA: Diagnosis not present

## 2015-04-14 DIAGNOSIS — E11621 Type 2 diabetes mellitus with foot ulcer: Secondary | ICD-10-CM | POA: Diagnosis not present

## 2015-04-14 DIAGNOSIS — B9562 Methicillin resistant Staphylococcus aureus infection as the cause of diseases classified elsewhere: Secondary | ICD-10-CM | POA: Diagnosis not present

## 2015-04-14 NOTE — Telephone Encounter (Signed)
Forward to Dr Ellyn Hack AND Erasmo Downer -pharmacist

## 2015-04-14 NOTE — Telephone Encounter (Signed)
-----   Message -----   From: Gregery Na, RN   Sent: 04/14/2015  3:40 PM    To: Luanna Salk, LPN   Per Dr. Nicole Cella message, we do not need to hold Plavix prior to [name]'s surgery.   Thanks, Colletta [place]  ----- Message -----   From: Angelia Mould, MD   Sent: 04/14/2015  3:25 PM    To: Gregery Na, RN   It is fine to continue the Plavix.  Thanks  CD  ----- Message -----   From: Gregery Na, RN   Sent: 04/14/2015 12:48 PM    To: Angelia Mould, MD   Ms. Laclaire is scheduled on 1/12 for debridement of (L) heel wound. She wanted to wait until after 1/5. Also, she is on Plavix, in addition to the Coumadin. Do you want to continue the Plavix since she will hold the Coumadin 4 days prior to procedure?   Thanks, Colletta Maryland

## 2015-04-14 NOTE — Telephone Encounter (Signed)
-----   Message from Angelia Mould, MD sent at 04/14/2015  3:25 PM EST ----- It is fine to continue the Plavix. Thanks CD ----- Message -----    From: Gregery Na, RN    Sent: 04/14/2015  12:48 PM      To: Angelia Mould, MD  [name] is scheduled on 1/12 for debridement of (L) heel wound. She wanted to wait until after 1/5. Also, she is on Plavix, in addition to the Coumadin. Do you want to continue the Plavix since she will hold the Coumadin 4 days prior to procedure?  Thanks, Colletta [place]

## 2015-04-14 NOTE — Telephone Encounter (Signed)
Ok to hold coumadin x 5 days. Continue Plavix as per Dr. Doren Custard.  [place]

## 2015-04-14 NOTE — Telephone Encounter (Signed)
Forward to  [employer] ( pharmacist) to be address- holding warfarin dose for surgery Dr Ellyn Hack has to address holding plavix

## 2015-04-14 NOTE — Telephone Encounter (Signed)
Pt is schedule for surgery on 05-05-15.Can they stop her Coumadin on 04-30-15 and stay off for 4 days?

## 2015-04-14 NOTE — Telephone Encounter (Signed)
Returned call to [place] with [facility] wanting to ask Dr.Harding if ok for patient to hold coumadin 4 days prior to surgery.Also is it ok to hold plavix too.Message sent to [place] for advice.

## 2015-04-14 NOTE — Telephone Encounter (Signed)
Opened in error

## 2015-04-14 NOTE — Telephone Encounter (Signed)
-----   Message from Luanna Salk, LPN sent at [id number]  3:44 PM EST -----   ----- Message -----    From: Gregery Na, RN    Sent: 04/14/2015   3:40 PM      To: Luanna Salk, LPN  Per Dr. Nicole Cella message, we do not need to hold Plavix prior to Ms. Fuerte's surgery.  Thanks, Colletta [place]  ----- Message -----    From: Angelia Mould, MD    Sent: 04/14/2015   3:25 PM      To: Gregery Na, RN  It is fine to continue the Plavix. Thanks CD ----- Message -----    From: Gregery Na, RN    Sent: 04/14/2015  12:48 PM      To: Angelia Mould, MD  Ms. Evetts is scheduled on 1/12 for debridement of (L) heel wound. She wanted to wait until after 1/5. Also, she is on Plavix, in addition to the Coumadin. Do you want to continue the Plavix since she will hold the Coumadin 4 days prior to procedure?  Thanks, Colletta [place]

## 2015-04-15 DIAGNOSIS — E1129 Type 2 diabetes mellitus with other diabetic kidney complication: Secondary | ICD-10-CM | POA: Diagnosis not present

## 2015-04-15 DIAGNOSIS — N2581 Secondary hyperparathyroidism of renal origin: Secondary | ICD-10-CM | POA: Diagnosis not present

## 2015-04-15 DIAGNOSIS — N186 End stage renal disease: Secondary | ICD-10-CM | POA: Diagnosis not present

## 2015-04-15 NOTE — Telephone Encounter (Signed)
Forward to [facility]'s office- Estanislado Pandy RN

## 2015-04-18 DIAGNOSIS — N186 End stage renal disease: Secondary | ICD-10-CM | POA: Diagnosis not present

## 2015-04-18 DIAGNOSIS — E1129 Type 2 diabetes mellitus with other diabetic kidney complication: Secondary | ICD-10-CM | POA: Diagnosis not present

## 2015-04-18 DIAGNOSIS — N2581 Secondary hyperparathyroidism of renal origin: Secondary | ICD-10-CM | POA: Diagnosis not present

## 2015-04-19 ENCOUNTER — Ambulatory Visit (INDEPENDENT_AMBULATORY_CARE_PROVIDER_SITE_OTHER): Payer: Medicare Other | Admitting: Pharmacist Clinician (PhC)/ Clinical Pharmacy Specialist

## 2015-04-19 DIAGNOSIS — I120 Hypertensive chronic kidney disease with stage 5 chronic kidney disease or end stage renal disease: Secondary | ICD-10-CM | POA: Diagnosis not present

## 2015-04-19 DIAGNOSIS — I4891 Unspecified atrial fibrillation: Secondary | ICD-10-CM

## 2015-04-19 DIAGNOSIS — B9562 Methicillin resistant Staphylococcus aureus infection as the cause of diseases classified elsewhere: Secondary | ICD-10-CM | POA: Diagnosis not present

## 2015-04-19 DIAGNOSIS — E11621 Type 2 diabetes mellitus with foot ulcer: Secondary | ICD-10-CM | POA: Diagnosis not present

## 2015-04-19 DIAGNOSIS — E1151 Type 2 diabetes mellitus with diabetic peripheral angiopathy without gangrene: Secondary | ICD-10-CM | POA: Diagnosis not present

## 2015-04-19 DIAGNOSIS — B961 Klebsiella pneumoniae [K. pneumoniae] as the cause of diseases classified elsewhere: Secondary | ICD-10-CM | POA: Diagnosis not present

## 2015-04-19 DIAGNOSIS — L97422 Non-pressure chronic ulcer of left heel and midfoot with fat layer exposed: Secondary | ICD-10-CM | POA: Diagnosis not present

## 2015-04-19 LAB — POCT INR: INR: 1.7

## 2015-04-20 DIAGNOSIS — N2581 Secondary hyperparathyroidism of renal origin: Secondary | ICD-10-CM | POA: Diagnosis not present

## 2015-04-20 DIAGNOSIS — N186 End stage renal disease: Secondary | ICD-10-CM | POA: Diagnosis not present

## 2015-04-20 DIAGNOSIS — E1129 Type 2 diabetes mellitus with other diabetic kidney complication: Secondary | ICD-10-CM | POA: Diagnosis not present

## 2015-04-21 ENCOUNTER — Encounter: Payer: Self-pay | Admitting: Physician Assistant

## 2015-04-21 ENCOUNTER — Ambulatory Visit (INDEPENDENT_AMBULATORY_CARE_PROVIDER_SITE_OTHER): Payer: Medicare Other | Admitting: Physician Assistant

## 2015-04-21 VITALS — BP 130/72 | HR 98 | Ht 62.0 in | Wt 142.0 lb

## 2015-04-21 DIAGNOSIS — I749 Embolism and thrombosis of unspecified artery: Secondary | ICD-10-CM

## 2015-04-21 DIAGNOSIS — B961 Klebsiella pneumoniae [K. pneumoniae] as the cause of diseases classified elsewhere: Secondary | ICD-10-CM | POA: Diagnosis not present

## 2015-04-21 DIAGNOSIS — B9562 Methicillin resistant Staphylococcus aureus infection as the cause of diseases classified elsewhere: Secondary | ICD-10-CM | POA: Diagnosis not present

## 2015-04-21 DIAGNOSIS — I4891 Unspecified atrial fibrillation: Secondary | ICD-10-CM

## 2015-04-21 DIAGNOSIS — Z7901 Long term (current) use of anticoagulants: Secondary | ICD-10-CM

## 2015-04-21 DIAGNOSIS — I480 Paroxysmal atrial fibrillation: Secondary | ICD-10-CM | POA: Insufficient documentation

## 2015-04-21 DIAGNOSIS — E1151 Type 2 diabetes mellitus with diabetic peripheral angiopathy without gangrene: Secondary | ICD-10-CM | POA: Diagnosis not present

## 2015-04-21 DIAGNOSIS — E11621 Type 2 diabetes mellitus with foot ulcer: Secondary | ICD-10-CM | POA: Diagnosis not present

## 2015-04-21 DIAGNOSIS — L97422 Non-pressure chronic ulcer of left heel and midfoot with fat layer exposed: Secondary | ICD-10-CM | POA: Diagnosis not present

## 2015-04-21 DIAGNOSIS — I120 Hypertensive chronic kidney disease with stage 5 chronic kidney disease or end stage renal disease: Secondary | ICD-10-CM | POA: Diagnosis not present

## 2015-04-21 NOTE — Patient Instructions (Addendum)
Medication Instructions:  Your physician recommends that you continue on your current medications as directed. Please refer to the Current Medication list given to you today.  Labwork: NONE  Testing/Procedures: NONE  Follow-Up: Your physician wants you to follow-up in: 3 months with Dr. Harding.    If you need a refill on your cardiac medications before your next appointment, please call your pharmacy.    

## 2015-04-21 NOTE — Progress Notes (Signed)
Cardiology Office Note   Date:  04/21/2015   ID:  [name], DOB March 25, 1961, MRN [id number]  PCP:  Lance Bosch, NP  Cardiologist:  Dr. Ellyn Hack (sought in the hospital 03/25/2015) Rosaria Ferries, PA-C   Chief Complaint  Patient presents with  . Follow-up    2 week post hosp/AFIB//pt states her heart was racing/pt c/o swelling, possibly from dialysis.    History of Present Illness: [name] is a 54 y.o. female with a history of HTN, DM, CVA 2002, PVD, ESRD, hyperparathyroidism, seen in-hosp 12/02 for afib, RVR and chest pain. D/c on diltiazem and warfarin and was in [employer] presents for post hospital follow-up.  She has been compliant with dialysis appointments. She has not had chest pain. She feels that she is breathing okay. She has not had any palpitations. She does not think she has been in atrial fibrillation since she left the hospital. She told the tech that her heart was racing but she currently says she is not having any problems with this.  She has significant left lower leg tenderness. She has a wound on her left ankle that is being followed by the wound clinic. Her hemodialysis access is in her right thigh. She has an old HD access in her left arm. The main source of pain and discomfort is her left leg tenderness and she is also having muscle spasms in her feet, left more so than right. They seem to be worse after dialysis.   Past Medical History  Diagnosis Date  . ESRD (end stage renal disease) ([place])   . Hypertension   . CVA (cerebral infarction)     right parietal 05/19/2000  . Vaginal bleeding   . Hyperparathyroidism   . Pneumonia   . Stroke ([place])     2002,no residual  . Peripheral vascular disease ([place])   . GERD (gastroesophageal reflux disease)   . Anemia   . DM (diabetes mellitus) type II controlled peripheral vascular disorder ([place])   . Slip/trip w/o falling due to stepping on object, subs July  2014    Past Surgical History    Procedure Laterality Date  . Parathyroidectomy with autotransplant  12/07/2010  . Knee surgery Right X 2  . Brachial artery graft      x2 for dialysis  . Av fistula placement Right     upper thigh  . Dental extractions Bilateral   . Dilation and curettage of uterus  1986  . 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 Leg surgery Right   . Foot surgery Left     5 TOES AMPUTATED  . Abdominal aortagram N/A 05/27/2012    Procedure: ABDOMINAL Maxcine Ham;  Surgeon: Serafina Mitchell, MD;  Location: [facility] CATH LAB;  Service: Cardiovascular;  Laterality: N/A;  . Lower extremity angiogram Left 05/27/2012    Procedure: LOWER EXTREMITY ANGIOGRAM;  Surgeon: Serafina Mitchell, MD;  Location: [facility] CATH LAB;  Service: Cardiovascular;  Laterality: Left;  lt leg angio  . Tooth extraction  Sep 08, 2014  . Peripheral vascular catheterization N/A 01/11/2015    Procedure: Lower Extremity Angiography;  Surgeon: Serafina Mitchell, MD;  Location: [facility] CV LAB;  Service: Cardiovascular;  Laterality: N/A;    Current Outpatient Prescriptions  Medication Sig Dispense Refill  . acetaminophen (TYLENOL) 325 MG tablet Take 650 mg by mouth every Monday,  Wednesday, and Friday. With dialysis    . CALCIUM-VITAMIN D PO Take 500 mg by mouth 3 (three) times daily with meals.     . Clopidogrel Bisulfate (PLAVIX PO) Take 75 mg by mouth daily.     . Cyanocobalamin (VITAMIN B-12 PO) Take 1 tablet by mouth 4 (four) times a week. Non dialysis days. Tues, Thurs, Sat and Sunday    . diazepam (VALIUM) 5 MG tablet Take 5 mg by mouth every 8 (eight) hours as needed for muscle spasms.   0  . diltiazem (CARDIZEM CD) 120 MG 24 hr capsule Take 1 capsule (120 mg total) by mouth daily. 60 capsule 0  . diphenhydrAMINE (BENADRYL) 25 MG tablet Take 25 mg by mouth every 6 (six) hours as needed (Muscle spasm in feet).     Marland Kitchen glipiZIDE (GLUCOTROL) 10 MG tablet Take 0.5 tablets (5 mg total) by mouth daily. 45 tablet 3  . multivitamin (RENA-VIT) TABS tablet Take 1 tablet by mouth daily.    Marland Kitchen neomycin-bacitracin-polymyxin (NEOSPORIN) [id number] ointment Apply 1 application topically daily. Apply to foot    . polyethylene glycol (MIRALAX / GLYCOLAX) packet Take 17 g by mouth daily. (Patient taking differently: Take 17 g by mouth daily as needed for mild constipation. ) 14 each 0  . traMADol (ULTRAM) 50 MG tablet Take 50 mg by mouth 2 (two) times daily as needed for moderate pain (for foot pain). For foot pain  0  . vitamin C (ASCORBIC ACID) 500 MG tablet Take 500 mg by mouth daily.    . vitamin E 400 UNIT capsule Take 400 Units by mouth daily.    Marland Kitchen warfarin (COUMADIN) 5 MG tablet Take 1.5 tablets (7.5 mg total) by mouth daily at 6 PM. 60 tablet 0   No current facility-administered medications for this visit.    Allergies:   Doxycycline and Peroxyl    Social History:  The patient  reports that she has never smoked. She has never used smokeless tobacco. She reports that she does not drink alcohol or use illicit drugs.   Family History:  The patient's family history includes Hypertension in her father; Kidney disease in her mother.    ROS:  Please see the history of present illness. All other systems are reviewed and negative.    PHYSICAL EXAM: VS:  BP 130/72 mmHg  Pulse 98  Ht 5\' 2"  (1.575 m)  Wt 142 lb (64.411 kg)  BMI 25.97 kg/m2  LMP  (LMP Unknown) , BMI Body mass index is 25.97 kg/(m^2). GEN: Well nourished, well developed, female in no acute distress HEENT: normal for age  Neck: no JVD, no carotid bruit, no masses Cardiac: RRR; no murmur, no rubs, or gallops Respiratory:  clear to auscultation bilaterally, normal work of breathing GI: soft, nontender, nondistended, + BS MS: no deformity or atrophy; no edema; distal pulses are decreased in both upper extremities with old HDL access noted in left  upper extremity. Left lower extremity is extremely tender to palpation. Her left foot is bandaged and not disturbed. Her right lower extremity has a weak but palpable pulse. No edema is noted.  Skin: warm and dry, no rash Neuro:  Strength and sensation are intact   EKG:  EKG is ordered today. The ekg ordered today demonstrates sinus rhythm with inferior T-wave changes that are improved from previous ECG  ECHO: 03/26/2015 - Left ventricle: The cavity size was normal. There was moderate concentric hypertrophy. Systolic function was vigorous. The estimated ejection fraction was in  the range of 65% to 70%. Wall motion was normal; there were no regional wall motion abnormalities. Doppler parameters are consistent with abnormal left ventricular relaxation (grade 1 diastolic dysfunction). Doppler parameters are consistent with high ventricular filling pressure. - Mitral valve: Calcified annulus. - Left atrium: The atrium was mildly dilated.  MV 03/26/2015 IMPRESSION: 1. No reversible ischemia or infarction. 2. Septal dyskinesia. Otherwise normal left ventricular wall motion 3. Left ventricular ejection fraction 55% 4. Low-risk stress test findings*.  Recent Labs: 08/16/2014: ALT 20 08/18/2014: Magnesium 2.3 03/25/2015: TSH 0.298* 03/26/2015: BUN 38*; Creatinine, Ser 8.75*; Potassium 4.4; Sodium 135 03/27/2015: Hemoglobin 10.9*; Platelets 241    Lipid Panel    Component Value Date/Time   CHOL 136 10/27/2013 1121   TRIG 74 10/27/2013 1121   HDL 48 10/27/2013 1121   CHOLHDL 2.8 10/27/2013 1121   VLDL 15 10/27/2013 1121   LDLCALC 73 10/27/2013 1121     Wt Readings from Last 3 Encounters:  04/21/15 142 lb (64.411 kg)  04/13/15 152 lb (68.947 kg)  03/27/15 146 lb 12.8 oz (66.588 kg)     Other studies Reviewed: Additional studies/ records that were reviewed today include: Hospital records and testing.  ASSESSMENT AND PLAN:  1.  PAF: She has no recent  palpitations. She states that she does not believe her atrial fibrillation is a problem right now.she is aware the Cardizem is a green capsule and says she is taking it. Her blood pressure in baseline heart rate are okay. No medication changes.  2. Chronic anticoagulation: CHADS2VASC=5. Continue Coumadin. She states the home health nurse is checking her Coumadin and managing this at home when she comes to do dressing changes.  She is also on Plavix. The reason for this is unclear. After researching through the records, she was on aspirin in June 2010. In a December 2010 ER visit, she was no longer on aspirin and was on Plavix. Continue Plavix for now  3. Volume Status: her volume status is good and is managed by dialysis.   Current medicines are reviewed at length with the patient today.  The patient does not have concerns regarding medicines.  The following changes have been made:  no change  Labs/ tests ordered today include:  ECG   Disposition:   FU with Dr. Ellyn Hack  Signed, Lenoard Aden  04/21/2015 2:24 PM    [address], [place], [place]  [address] Phone: [phone]; Fax: [phone]

## 2015-04-22 DIAGNOSIS — L97422 Non-pressure chronic ulcer of left heel and midfoot with fat layer exposed: Secondary | ICD-10-CM | POA: Diagnosis not present

## 2015-04-22 DIAGNOSIS — E1129 Type 2 diabetes mellitus with other diabetic kidney complication: Secondary | ICD-10-CM | POA: Diagnosis not present

## 2015-04-22 DIAGNOSIS — E1151 Type 2 diabetes mellitus with diabetic peripheral angiopathy without gangrene: Secondary | ICD-10-CM | POA: Diagnosis not present

## 2015-04-22 DIAGNOSIS — N186 End stage renal disease: Secondary | ICD-10-CM | POA: Diagnosis not present

## 2015-04-22 DIAGNOSIS — I120 Hypertensive chronic kidney disease with stage 5 chronic kidney disease or end stage renal disease: Secondary | ICD-10-CM | POA: Diagnosis not present

## 2015-04-22 DIAGNOSIS — B961 Klebsiella pneumoniae [K. pneumoniae] as the cause of diseases classified elsewhere: Secondary | ICD-10-CM | POA: Diagnosis not present

## 2015-04-22 DIAGNOSIS — N2581 Secondary hyperparathyroidism of renal origin: Secondary | ICD-10-CM | POA: Diagnosis not present

## 2015-04-22 DIAGNOSIS — E11621 Type 2 diabetes mellitus with foot ulcer: Secondary | ICD-10-CM | POA: Diagnosis not present

## 2015-04-22 DIAGNOSIS — B9562 Methicillin resistant Staphylococcus aureus infection as the cause of diseases classified elsewhere: Secondary | ICD-10-CM | POA: Diagnosis not present

## 2015-04-23 DIAGNOSIS — Z992 Dependence on renal dialysis: Secondary | ICD-10-CM | POA: Diagnosis not present

## 2015-04-23 DIAGNOSIS — E1151 Type 2 diabetes mellitus with diabetic peripheral angiopathy without gangrene: Secondary | ICD-10-CM | POA: Diagnosis not present

## 2015-04-23 DIAGNOSIS — I4891 Unspecified atrial fibrillation: Secondary | ICD-10-CM | POA: Diagnosis not present

## 2015-04-23 DIAGNOSIS — E1129 Type 2 diabetes mellitus with other diabetic kidney complication: Secondary | ICD-10-CM | POA: Diagnosis not present

## 2015-04-23 DIAGNOSIS — E1122 Type 2 diabetes mellitus with diabetic chronic kidney disease: Secondary | ICD-10-CM | POA: Diagnosis not present

## 2015-04-23 DIAGNOSIS — L97421 Non-pressure chronic ulcer of left heel and midfoot limited to breakdown of skin: Secondary | ICD-10-CM | POA: Diagnosis not present

## 2015-04-23 DIAGNOSIS — I120 Hypertensive chronic kidney disease with stage 5 chronic kidney disease or end stage renal disease: Secondary | ICD-10-CM | POA: Diagnosis not present

## 2015-04-23 DIAGNOSIS — N186 End stage renal disease: Secondary | ICD-10-CM | POA: Diagnosis not present

## 2015-04-25 DIAGNOSIS — E1129 Type 2 diabetes mellitus with other diabetic kidney complication: Secondary | ICD-10-CM | POA: Diagnosis not present

## 2015-04-25 DIAGNOSIS — N186 End stage renal disease: Secondary | ICD-10-CM | POA: Diagnosis not present

## 2015-04-25 DIAGNOSIS — D631 Anemia in chronic kidney disease: Secondary | ICD-10-CM | POA: Diagnosis not present

## 2015-04-25 DIAGNOSIS — A4902 Methicillin resistant Staphylococcus aureus infection, unspecified site: Secondary | ICD-10-CM | POA: Diagnosis not present

## 2015-04-25 DIAGNOSIS — D509 Iron deficiency anemia, unspecified: Secondary | ICD-10-CM | POA: Diagnosis not present

## 2015-04-25 DIAGNOSIS — N2581 Secondary hyperparathyroidism of renal origin: Secondary | ICD-10-CM | POA: Diagnosis not present

## 2015-04-26 ENCOUNTER — Ambulatory Visit (INDEPENDENT_AMBULATORY_CARE_PROVIDER_SITE_OTHER): Payer: Medicare Other | Admitting: Pharmacist Clinician (PhC)/ Clinical Pharmacy Specialist

## 2015-04-26 DIAGNOSIS — I4891 Unspecified atrial fibrillation: Secondary | ICD-10-CM | POA: Diagnosis not present

## 2015-04-26 DIAGNOSIS — E1122 Type 2 diabetes mellitus with diabetic chronic kidney disease: Secondary | ICD-10-CM | POA: Diagnosis not present

## 2015-04-26 DIAGNOSIS — L97421 Non-pressure chronic ulcer of left heel and midfoot limited to breakdown of skin: Secondary | ICD-10-CM | POA: Diagnosis not present

## 2015-04-26 DIAGNOSIS — N186 End stage renal disease: Secondary | ICD-10-CM | POA: Diagnosis not present

## 2015-04-26 DIAGNOSIS — I120 Hypertensive chronic kidney disease with stage 5 chronic kidney disease or end stage renal disease: Secondary | ICD-10-CM | POA: Diagnosis not present

## 2015-04-26 DIAGNOSIS — E1151 Type 2 diabetes mellitus with diabetic peripheral angiopathy without gangrene: Secondary | ICD-10-CM | POA: Diagnosis not present

## 2015-04-26 LAB — POCT INR: INR: 4.2

## 2015-04-27 DIAGNOSIS — D631 Anemia in chronic kidney disease: Secondary | ICD-10-CM | POA: Diagnosis not present

## 2015-04-27 DIAGNOSIS — A4902 Methicillin resistant Staphylococcus aureus infection, unspecified site: Secondary | ICD-10-CM | POA: Diagnosis not present

## 2015-04-27 DIAGNOSIS — N186 End stage renal disease: Secondary | ICD-10-CM | POA: Diagnosis not present

## 2015-04-27 DIAGNOSIS — N2581 Secondary hyperparathyroidism of renal origin: Secondary | ICD-10-CM | POA: Diagnosis not present

## 2015-04-27 DIAGNOSIS — D509 Iron deficiency anemia, unspecified: Secondary | ICD-10-CM | POA: Diagnosis not present

## 2015-04-27 DIAGNOSIS — E1129 Type 2 diabetes mellitus with other diabetic kidney complication: Secondary | ICD-10-CM | POA: Diagnosis not present

## 2015-04-28 DIAGNOSIS — E1151 Type 2 diabetes mellitus with diabetic peripheral angiopathy without gangrene: Secondary | ICD-10-CM | POA: Diagnosis not present

## 2015-04-28 DIAGNOSIS — L97421 Non-pressure chronic ulcer of left heel and midfoot limited to breakdown of skin: Secondary | ICD-10-CM | POA: Diagnosis not present

## 2015-04-28 DIAGNOSIS — I4891 Unspecified atrial fibrillation: Secondary | ICD-10-CM | POA: Diagnosis not present

## 2015-04-28 DIAGNOSIS — N186 End stage renal disease: Secondary | ICD-10-CM | POA: Diagnosis not present

## 2015-04-28 DIAGNOSIS — I120 Hypertensive chronic kidney disease with stage 5 chronic kidney disease or end stage renal disease: Secondary | ICD-10-CM | POA: Diagnosis not present

## 2015-04-28 DIAGNOSIS — E1122 Type 2 diabetes mellitus with diabetic chronic kidney disease: Secondary | ICD-10-CM | POA: Diagnosis not present

## 2015-04-29 DIAGNOSIS — A4902 Methicillin resistant Staphylococcus aureus infection, unspecified site: Secondary | ICD-10-CM | POA: Diagnosis not present

## 2015-04-29 DIAGNOSIS — E1129 Type 2 diabetes mellitus with other diabetic kidney complication: Secondary | ICD-10-CM | POA: Diagnosis not present

## 2015-04-29 DIAGNOSIS — N186 End stage renal disease: Secondary | ICD-10-CM | POA: Diagnosis not present

## 2015-04-29 DIAGNOSIS — N2581 Secondary hyperparathyroidism of renal origin: Secondary | ICD-10-CM | POA: Diagnosis not present

## 2015-04-29 DIAGNOSIS — D631 Anemia in chronic kidney disease: Secondary | ICD-10-CM | POA: Diagnosis not present

## 2015-04-29 DIAGNOSIS — D509 Iron deficiency anemia, unspecified: Secondary | ICD-10-CM | POA: Diagnosis not present

## 2015-05-02 DIAGNOSIS — A4902 Methicillin resistant Staphylococcus aureus infection, unspecified site: Secondary | ICD-10-CM | POA: Diagnosis not present

## 2015-05-02 DIAGNOSIS — D631 Anemia in chronic kidney disease: Secondary | ICD-10-CM | POA: Diagnosis not present

## 2015-05-02 DIAGNOSIS — N186 End stage renal disease: Secondary | ICD-10-CM | POA: Diagnosis not present

## 2015-05-02 DIAGNOSIS — E1129 Type 2 diabetes mellitus with other diabetic kidney complication: Secondary | ICD-10-CM | POA: Diagnosis not present

## 2015-05-02 DIAGNOSIS — D509 Iron deficiency anemia, unspecified: Secondary | ICD-10-CM | POA: Diagnosis not present

## 2015-05-02 DIAGNOSIS — N2581 Secondary hyperparathyroidism of renal origin: Secondary | ICD-10-CM | POA: Diagnosis not present

## 2015-05-03 ENCOUNTER — Telehealth: Payer: Self-pay | Admitting: *Deleted

## 2015-05-03 DIAGNOSIS — I4891 Unspecified atrial fibrillation: Secondary | ICD-10-CM | POA: Diagnosis not present

## 2015-05-03 DIAGNOSIS — N186 End stage renal disease: Secondary | ICD-10-CM | POA: Diagnosis not present

## 2015-05-03 DIAGNOSIS — I120 Hypertensive chronic kidney disease with stage 5 chronic kidney disease or end stage renal disease: Secondary | ICD-10-CM | POA: Diagnosis not present

## 2015-05-03 DIAGNOSIS — E1122 Type 2 diabetes mellitus with diabetic chronic kidney disease: Secondary | ICD-10-CM | POA: Diagnosis not present

## 2015-05-03 DIAGNOSIS — E1151 Type 2 diabetes mellitus with diabetic peripheral angiopathy without gangrene: Secondary | ICD-10-CM | POA: Diagnosis not present

## 2015-05-03 DIAGNOSIS — L97421 Non-pressure chronic ulcer of left heel and midfoot limited to breakdown of skin: Secondary | ICD-10-CM | POA: Diagnosis not present

## 2015-05-03 NOTE — Telephone Encounter (Signed)
Faxed - signed PTT-INR ORDER , DIRECTION FOR WARFARIN DOSE FOR 04/26/15

## 2015-05-04 ENCOUNTER — Encounter (HOSPITAL_COMMUNITY): Payer: Self-pay | Admitting: *Deleted

## 2015-05-04 DIAGNOSIS — A4902 Methicillin resistant Staphylococcus aureus infection, unspecified site: Secondary | ICD-10-CM | POA: Diagnosis not present

## 2015-05-04 DIAGNOSIS — N2581 Secondary hyperparathyroidism of renal origin: Secondary | ICD-10-CM | POA: Diagnosis not present

## 2015-05-04 DIAGNOSIS — D631 Anemia in chronic kidney disease: Secondary | ICD-10-CM | POA: Diagnosis not present

## 2015-05-04 DIAGNOSIS — E1129 Type 2 diabetes mellitus with other diabetic kidney complication: Secondary | ICD-10-CM | POA: Diagnosis not present

## 2015-05-04 DIAGNOSIS — N186 End stage renal disease: Secondary | ICD-10-CM | POA: Diagnosis not present

## 2015-05-04 DIAGNOSIS — D509 Iron deficiency anemia, unspecified: Secondary | ICD-10-CM | POA: Diagnosis not present

## 2015-05-04 MED ORDER — CEFUROXIME SODIUM 1.5 G IJ SOLR
1.5000 g | INTRAMUSCULAR | Status: AC
  Administered 2015-05-05: 1500 mg via INTRAVENOUS
  Filled 2015-05-04: qty 1.5

## 2015-05-04 NOTE — Progress Notes (Signed)
Pt denies cardiac history, chest pain or sob.   Pt on Coumadin (had stroke in 2002) last dose was 04/30/15.

## 2015-05-05 ENCOUNTER — Inpatient Hospital Stay (HOSPITAL_COMMUNITY)
Admission: RE | Admit: 2015-05-05 | Discharge: 2015-05-07 | DRG: 629 | Disposition: A | Discharge status: Home Health | Payer: Medicare Other | Source: Ambulatory Visit | Attending: Vascular Surgery | Admitting: Vascular Surgery

## 2015-05-05 ENCOUNTER — Encounter (HOSPITAL_COMMUNITY): Payer: Self-pay | Admitting: *Deleted

## 2015-05-05 ENCOUNTER — Encounter (HOSPITAL_COMMUNITY)
Admission: RE | Disposition: A | Discharge status: Home Health | Payer: Self-pay | Source: Ambulatory Visit | Attending: Vascular Surgery

## 2015-05-05 ENCOUNTER — Inpatient Hospital Stay (HOSPITAL_COMMUNITY): Payer: Medicare Other | Admitting: Anesthesiology

## 2015-05-05 DIAGNOSIS — Z8249 Family history of ischemic heart disease and other diseases of the circulatory system: Secondary | ICD-10-CM

## 2015-05-05 DIAGNOSIS — L97422 Non-pressure chronic ulcer of left heel and midfoot with fat layer exposed: Secondary | ICD-10-CM | POA: Diagnosis present

## 2015-05-05 DIAGNOSIS — S91302A Unspecified open wound, left foot, initial encounter: Secondary | ICD-10-CM | POA: Diagnosis not present

## 2015-05-05 DIAGNOSIS — Z91048 Other nonmedicinal substance allergy status: Secondary | ICD-10-CM

## 2015-05-05 DIAGNOSIS — Z9862 Peripheral vascular angioplasty status: Secondary | ICD-10-CM

## 2015-05-05 DIAGNOSIS — K219 Gastro-esophageal reflux disease without esophagitis: Secondary | ICD-10-CM | POA: Diagnosis not present

## 2015-05-05 DIAGNOSIS — Z992 Dependence on renal dialysis: Secondary | ICD-10-CM

## 2015-05-05 DIAGNOSIS — Z8673 Personal history of transient ischemic attack (TIA), and cerebral infarction without residual deficits: Secondary | ICD-10-CM | POA: Diagnosis not present

## 2015-05-05 DIAGNOSIS — I480 Paroxysmal atrial fibrillation: Secondary | ICD-10-CM | POA: Diagnosis present

## 2015-05-05 DIAGNOSIS — Z7902 Long term (current) use of antithrombotics/antiplatelets: Secondary | ICD-10-CM

## 2015-05-05 DIAGNOSIS — Z7984 Long term (current) use of oral hypoglycemic drugs: Secondary | ICD-10-CM | POA: Diagnosis not present

## 2015-05-05 DIAGNOSIS — Z89412 Acquired absence of left great toe: Secondary | ICD-10-CM | POA: Diagnosis not present

## 2015-05-05 DIAGNOSIS — B9562 Methicillin resistant Staphylococcus aureus infection as the cause of diseases classified elsewhere: Secondary | ICD-10-CM | POA: Diagnosis present

## 2015-05-05 DIAGNOSIS — L97903 Non-pressure chronic ulcer of unspecified part of unspecified lower leg with necrosis of muscle: Secondary | ICD-10-CM

## 2015-05-05 DIAGNOSIS — N2581 Secondary hyperparathyroidism of renal origin: Secondary | ICD-10-CM | POA: Diagnosis present

## 2015-05-05 DIAGNOSIS — E1151 Type 2 diabetes mellitus with diabetic peripheral angiopathy without gangrene: Secondary | ICD-10-CM | POA: Diagnosis present

## 2015-05-05 DIAGNOSIS — T148XXA Other injury of unspecified body region, initial encounter: Secondary | ICD-10-CM

## 2015-05-05 DIAGNOSIS — I120 Hypertensive chronic kidney disease with stage 5 chronic kidney disease or end stage renal disease: Secondary | ICD-10-CM | POA: Diagnosis present

## 2015-05-05 DIAGNOSIS — L8990 Pressure ulcer of unspecified site, unspecified stage: Secondary | ICD-10-CM | POA: Diagnosis not present

## 2015-05-05 DIAGNOSIS — Z881 Allergy status to other antibiotic agents status: Secondary | ICD-10-CM | POA: Diagnosis not present

## 2015-05-05 DIAGNOSIS — E11621 Type 2 diabetes mellitus with foot ulcer: Principal | ICD-10-CM | POA: Diagnosis present

## 2015-05-05 DIAGNOSIS — N186 End stage renal disease: Secondary | ICD-10-CM | POA: Diagnosis present

## 2015-05-05 DIAGNOSIS — D649 Anemia, unspecified: Secondary | ICD-10-CM | POA: Diagnosis present

## 2015-05-05 DIAGNOSIS — L97429 Non-pressure chronic ulcer of left heel and midfoot with unspecified severity: Secondary | ICD-10-CM | POA: Diagnosis not present

## 2015-05-05 DIAGNOSIS — L97409 Non-pressure chronic ulcer of unspecified heel and midfoot with unspecified severity: Secondary | ICD-10-CM | POA: Diagnosis not present

## 2015-05-05 DIAGNOSIS — D631 Anemia in chronic kidney disease: Secondary | ICD-10-CM | POA: Diagnosis not present

## 2015-05-05 DIAGNOSIS — E1122 Type 2 diabetes mellitus with diabetic chronic kidney disease: Secondary | ICD-10-CM | POA: Diagnosis present

## 2015-05-05 DIAGNOSIS — Z89422 Acquired absence of other left toe(s): Secondary | ICD-10-CM

## 2015-05-05 DIAGNOSIS — Z8614 Personal history of Methicillin resistant Staphylococcus aureus infection: Secondary | ICD-10-CM

## 2015-05-05 DIAGNOSIS — Z7901 Long term (current) use of anticoagulants: Secondary | ICD-10-CM

## 2015-05-05 DIAGNOSIS — Z841 Family history of disorders of kidney and ureter: Secondary | ICD-10-CM | POA: Diagnosis not present

## 2015-05-05 HISTORY — PX: APPLICATION OF WOUND VAC: SHX5189

## 2015-05-05 HISTORY — PX: I&D EXTREMITY: SHX5045

## 2015-05-05 HISTORY — DX: Constipation, unspecified: K59.00

## 2015-05-05 HISTORY — DX: Other complications of anesthesia, initial encounter: T88.59XA

## 2015-05-05 HISTORY — DX: Adverse effect of unspecified anesthetic, initial encounter: T41.45XA

## 2015-05-05 LAB — RENAL FUNCTION PANEL
Albumin: 3 g/dL — ABNORMAL LOW (ref 3.5–5.0)
Anion gap: 13 (ref 5–15)
BUN: 34 mg/dL — ABNORMAL HIGH (ref 6–20)
CO2: 28 mmol/L (ref 22–32)
Calcium: 9 mg/dL (ref 8.9–10.3)
Chloride: 96 mmol/L — ABNORMAL LOW (ref 101–111)
Creatinine, Ser: 9.41 mg/dL — ABNORMAL HIGH (ref 0.44–1.00)
GFR calc Af Amer: 5 mL/min — ABNORMAL LOW (ref >60)
GFR calc non Af Amer: 4 mL/min — ABNORMAL LOW (ref >60)
Glucose, Bld: 156 mg/dL — ABNORMAL HIGH (ref 65–99)
Phosphorus: 5.7 mg/dL — ABNORMAL HIGH (ref 2.5–4.6)
Potassium: 5 mmol/L (ref 3.5–5.1)
Sodium: 137 mmol/L (ref 135–145)

## 2015-05-05 LAB — CBC
HCT: 37 % (ref 36.0–46.0)
HCT: 35 % — ABNORMAL LOW (ref 36.0–46.0)
Hemoglobin: 11.9 g/dL — ABNORMAL LOW (ref 12.0–15.0)
Hemoglobin: 11.3 g/dL — ABNORMAL LOW (ref 12.0–15.0)
MCH: 29.8 pg (ref 26.0–34.0)
MCH: 29.7 pg (ref 26.0–34.0)
MCHC: 32.2 g/dL (ref 30.0–36.0)
MCHC: 32.3 g/dL (ref 30.0–36.0)
MCV: 92.7 fL (ref 78.0–100.0)
MCV: 92.1 fL (ref 78.0–100.0)
PLATELETS: 260 10*3/uL (ref 150–400)
Platelets: 278 10*3/uL (ref 150–400)
RBC: 3.99 MIL/uL (ref 3.87–5.11)
RBC: 3.8 MIL/uL — ABNORMAL LOW (ref 3.87–5.11)
RDW: 15.3 % (ref 11.5–15.5)
RDW: 15.4 % (ref 11.5–15.5)
WBC: 8.3 10*3/uL (ref 4.0–10.5)
WBC: 7.3 10*3/uL (ref 4.0–10.5)

## 2015-05-05 LAB — PROTIME-INR
INR: 1.24 (ref 0.00–1.49)
Prothrombin Time: 15.8 seconds — ABNORMAL HIGH (ref 11.6–15.2)

## 2015-05-05 LAB — COMPREHENSIVE METABOLIC PANEL
ALT: 19 U/L (ref 14–54)
ANION GAP: 16 — AB (ref 5–15)
AST: 19 U/L (ref 15–41)
Albumin: 3.3 g/dL — ABNORMAL LOW (ref 3.5–5.0)
Alkaline Phosphatase: 72 U/L (ref 38–126)
BUN: 32 mg/dL — ABNORMAL HIGH (ref 6–20)
CHLORIDE: 96 mmol/L — AB (ref 101–111)
CO2: 25 mmol/L (ref 22–32)
Calcium: 9.3 mg/dL (ref 8.9–10.3)
Creatinine, Ser: 8.31 mg/dL — ABNORMAL HIGH (ref 0.44–1.00)
GFR calc non Af Amer: 5 mL/min — ABNORMAL LOW (ref >60)
GFR, EST AFRICAN AMERICAN: 6 mL/min — AB (ref >60)
Glucose, Bld: 111 mg/dL — ABNORMAL HIGH (ref 65–99)
Potassium: 4.8 mmol/L (ref 3.5–5.1)
SODIUM: 137 mmol/L (ref 135–145)
Total Bilirubin: 0.7 mg/dL (ref 0.3–1.2)
Total Protein: 8.3 g/dL — ABNORMAL HIGH (ref 6.5–8.1)

## 2015-05-05 LAB — GLUCOSE, CAPILLARY
GLUCOSE-CAPILLARY: 99 mg/dL (ref 65–99)
GLUCOSE-CAPILLARY: 98 mg/dL (ref 65–99)
GLUCOSE-CAPILLARY: 198 mg/dL — AB (ref 65–99)
Glucose-Capillary: 99 mg/dL (ref 65–99)

## 2015-05-05 LAB — SURGICAL PCR SCREEN
MRSA, PCR: POSITIVE — AB
STAPHYLOCOCCUS AUREUS: POSITIVE — AB

## 2015-05-05 LAB — APTT: aPTT: 42 seconds — ABNORMAL HIGH (ref 24–37)

## 2015-05-05 SURGERY — IRRIGATION AND DEBRIDEMENT EXTREMITY
Anesthesia: General | Site: Foot | Laterality: Left

## 2015-05-05 MED ORDER — DIAZEPAM 5 MG PO TABS
ORAL_TABLET | ORAL | Status: AC
  Filled 2015-05-05: qty 1

## 2015-05-05 MED ORDER — MAGNESIUM SULFATE 2 GM/50ML IV SOLN
2.0000 g | Freq: Every day | INTRAVENOUS | Status: DC | PRN
  Filled 2015-05-05: qty 50

## 2015-05-05 MED ORDER — ACETAMINOPHEN 325 MG RE SUPP: 325.0000 mg | RECTAL | Status: DC | PRN

## 2015-05-05 MED ORDER — HYDRALAZINE HCL 20 MG/ML IJ SOLN: 5.0000 mg | INTRAMUSCULAR | Status: DC | PRN

## 2015-05-05 MED ORDER — FENTANYL CITRATE (PF) 100 MCG/2ML IJ SOLN
INTRAMUSCULAR | Status: DC | PRN
  Administered 2015-05-05: 100 ug via INTRAVENOUS
  Administered 2015-05-05 (×3): 50 ug via INTRAVENOUS

## 2015-05-05 MED ORDER — MIDAZOLAM HCL 2 MG/2ML IJ SOLN
INTRAMUSCULAR | Status: AC
  Filled 2015-05-05: qty 2

## 2015-05-05 MED ORDER — PENTAFLUOROPROP-TETRAFLUOROETH EX AERO: 1.0000 "application " | INHALATION_SPRAY | CUTANEOUS | Status: DC | PRN

## 2015-05-05 MED ORDER — HEPARIN SODIUM (PORCINE) 1000 UNIT/ML DIALYSIS: 1000.0000 [IU] | INTRAMUSCULAR | Status: DC | PRN

## 2015-05-05 MED ORDER — 0.9 % SODIUM CHLORIDE (POUR BTL) OPTIME
TOPICAL | Status: DC | PRN
  Administered 2015-05-05: 1000 mL

## 2015-05-05 MED ORDER — SODIUM CHLORIDE 0.9 % IV SOLN: 500.0000 mL | Freq: Once | INTRAVENOUS | Status: DC | PRN

## 2015-05-05 MED ORDER — MORPHINE SULFATE (PF) 2 MG/ML IV SOLN
1.0000 mg | INTRAVENOUS | Status: DC | PRN
  Administered 2015-05-05: 2 mg via INTRAVENOUS
  Administered 2015-05-05 (×2): 3 mg via INTRAVENOUS
  Administered 2015-05-06: 2 mg via INTRAVENOUS
  Administered 2015-05-06: 3 mg via INTRAVENOUS
  Administered 2015-05-06 – 2015-05-07 (×3): 2 mg via INTRAVENOUS
  Filled 2015-05-05 (×2): qty 1
  Filled 2015-05-05: qty 2
  Filled 2015-05-05: qty 1
  Filled 2015-05-05: qty 2
  Filled 2015-05-05: qty 1
  Filled 2015-05-05: qty 2
  Filled 2015-05-05: qty 1

## 2015-05-05 MED ORDER — HYDROMORPHONE HCL 1 MG/ML IJ SOLN
INTRAMUSCULAR | Status: AC
  Filled 2015-05-05: qty 1

## 2015-05-05 MED ORDER — HYDROMORPHONE HCL 1 MG/ML IJ SOLN
INTRAMUSCULAR | Status: AC
Start: 2015-05-05 — End: 2015-05-05
  Filled 2015-05-05: qty 1

## 2015-05-05 MED ORDER — ACETAMINOPHEN 325 MG PO TABS: 325.0000 mg | ORAL_TABLET | ORAL | Status: DC | PRN

## 2015-05-05 MED ORDER — LIDOCAINE-PRILOCAINE 2.5-2.5 % EX CREA: 1.0000 "application " | TOPICAL_CREAM | CUTANEOUS | Status: DC | PRN

## 2015-05-05 MED ORDER — POLYETHYLENE GLYCOL 3350 17 G PO PACK: 17.0000 g | PACK | Freq: Every day | ORAL | Status: DC | PRN

## 2015-05-05 MED ORDER — GUAIFENESIN-DM 100-10 MG/5ML PO SYRP: 15.0000 mL | ORAL_SOLUTION | ORAL | Status: DC | PRN

## 2015-05-05 MED ORDER — ACETAMINOPHEN 325 MG PO TABS
650.0000 mg | ORAL_TABLET | ORAL | Status: DC
  Administered 2015-05-06: 650 mg via ORAL
  Filled 2015-05-05: qty 2

## 2015-05-05 MED ORDER — TRAMADOL HCL 50 MG PO TABS
50.0000 mg | ORAL_TABLET | Freq: Two times a day (BID) | ORAL | Status: DC | PRN
  Administered 2015-05-05 – 2015-05-07 (×3): 50 mg via ORAL
  Filled 2015-05-05 (×3): qty 1

## 2015-05-05 MED ORDER — INSULIN ASPART 100 UNIT/ML ~~LOC~~ SOLN: 0.0000 [IU] | Freq: Three times a day (TID) | SUBCUTANEOUS | Status: DC

## 2015-05-05 MED ORDER — WARFARIN - PHYSICIAN DOSING INPATIENT: Freq: Every day | Status: DC

## 2015-05-05 MED ORDER — HEPARIN SODIUM (PORCINE) 5000 UNIT/ML IJ SOLN: 5000.0000 [IU] | Freq: Three times a day (TID) | INTRAMUSCULAR | Status: DC

## 2015-05-05 MED ORDER — POTASSIUM CHLORIDE CRYS ER 20 MEQ PO TBCR: 20.0000 meq | EXTENDED_RELEASE_TABLET | Freq: Every day | ORAL | Status: DC | PRN

## 2015-05-05 MED ORDER — MUPIROCIN 2 % EX OINT
1.0000 "application " | TOPICAL_OINTMENT | Freq: Once | CUTANEOUS | Status: AC
  Administered 2015-05-05: 1 via TOPICAL
  Filled 2015-05-05: qty 22

## 2015-05-05 MED ORDER — HYDROMORPHONE HCL 1 MG/ML IJ SOLN
0.2500 mg | INTRAMUSCULAR | Status: DC | PRN
  Administered 2015-05-05 (×4): 0.5 mg via INTRAVENOUS

## 2015-05-05 MED ORDER — RENA-VITE PO TABS
1.0000 | ORAL_TABLET | Freq: Every day | ORAL | Status: DC
  Administered 2015-05-05 – 2015-05-06 (×2): 1 via ORAL
  Filled 2015-05-05 (×2): qty 1

## 2015-05-05 MED ORDER — INSULIN ASPART 100 UNIT/ML ~~LOC~~ SOLN
0.0000 [IU] | Freq: Three times a day (TID) | SUBCUTANEOUS | Status: DC
  Administered 2015-05-07: 1 [IU] via SUBCUTANEOUS

## 2015-05-05 MED ORDER — SODIUM CHLORIDE 0.9 % IV SOLN
INTRAVENOUS | Status: DC
  Administered 2015-05-05 (×2): via INTRAVENOUS

## 2015-05-05 MED ORDER — LABETALOL HCL 5 MG/ML IV SOLN: 10.0000 mg | INTRAVENOUS | Status: DC | PRN

## 2015-05-05 MED ORDER — METOPROLOL TARTRATE 1 MG/ML IV SOLN
2.0000 mg | INTRAVENOUS | Status: DC | PRN
Start: 2015-05-05 — End: 2015-05-07

## 2015-05-05 MED ORDER — ALUM & MAG HYDROXIDE-SIMETH 200-200-20 MG/5ML PO SUSP: 15.0000 mL | ORAL | Status: DC | PRN

## 2015-05-05 MED ORDER — SODIUM CHLORIDE 0.9 % IV SOLN: 100.0000 mL | INTRAVENOUS | Status: DC | PRN

## 2015-05-05 MED ORDER — ONDANSETRON HCL 4 MG/2ML IJ SOLN: 4.0000 mg | Freq: Four times a day (QID) | INTRAMUSCULAR | Status: DC | PRN

## 2015-05-05 MED ORDER — PANTOPRAZOLE SODIUM 40 MG PO TBEC
40.0000 mg | DELAYED_RELEASE_TABLET | Freq: Every day | ORAL | Status: DC
  Administered 2015-05-07: 40 mg via ORAL
  Filled 2015-05-05: qty 1

## 2015-05-05 MED ORDER — DOCUSATE SODIUM 100 MG PO CAPS
100.0000 mg | ORAL_CAPSULE | Freq: Every day | ORAL | Status: DC
  Administered 2015-05-06 – 2015-05-07 (×2): 100 mg via ORAL
  Filled 2015-05-05 (×2): qty 1

## 2015-05-05 MED ORDER — VITAMIN E 180 MG (400 UNIT) PO CAPS
400.0000 [IU] | ORAL_CAPSULE | Freq: Every day | ORAL | Status: DC
  Administered 2015-05-06 – 2015-05-07 (×2): 400 [IU] via ORAL
  Filled 2015-05-05 (×2): qty 1

## 2015-05-05 MED ORDER — FENTANYL CITRATE (PF) 250 MCG/5ML IJ SOLN
INTRAMUSCULAR | Status: AC
  Filled 2015-05-05: qty 5

## 2015-05-05 MED ORDER — WARFARIN SODIUM 7.5 MG PO TABS: 7.5000 mg | ORAL_TABLET | Freq: Every day | ORAL | Status: DC

## 2015-05-05 MED ORDER — LIDOCAINE HCL (CARDIAC) 20 MG/ML IV SOLN
INTRAVENOUS | Status: DC | PRN
  Administered 2015-05-05: 50 mg via INTRAVENOUS

## 2015-05-05 MED ORDER — GLIPIZIDE 5 MG PO TABS
5.0000 mg | ORAL_TABLET | Freq: Every day | ORAL | Status: DC
  Administered 2015-05-06 – 2015-05-07 (×2): 5 mg via ORAL
  Filled 2015-05-05 (×2): qty 1

## 2015-05-05 MED ORDER — PHENYLEPHRINE HCL 10 MG/ML IJ SOLN
INTRAMUSCULAR | Status: DC | PRN
  Administered 2015-05-05: 80 ug via INTRAVENOUS

## 2015-05-05 MED ORDER — LIDOCAINE HCL (PF) 1 % IJ SOLN: 5.0000 mL | INTRAMUSCULAR | Status: DC | PRN

## 2015-05-05 MED ORDER — CEFAZOLIN SODIUM 1-5 GM-% IV SOLN
1.0000 g | INTRAVENOUS | Status: DC
  Administered 2015-05-05 – 2015-05-06 (×2): 1 g via INTRAVENOUS
  Filled 2015-05-05 (×3): qty 50

## 2015-05-05 MED ORDER — HYDROMORPHONE HCL 1 MG/ML IJ SOLN: 0.5000 mg | INTRAMUSCULAR | Status: DC | PRN

## 2015-05-05 MED ORDER — CLOPIDOGREL BISULFATE 75 MG PO TABS
75.0000 mg | ORAL_TABLET | Freq: Every day | ORAL | Status: DC
  Administered 2015-05-06 – 2015-05-07 (×2): 75 mg via ORAL
  Filled 2015-05-05 (×2): qty 1

## 2015-05-05 MED ORDER — PROPOFOL 10 MG/ML IV BOLUS
INTRAVENOUS | Status: DC | PRN
  Administered 2015-05-05: 120 mg via INTRAVENOUS

## 2015-05-05 MED ORDER — VITAMIN C 500 MG PO TABS
500.0000 mg | ORAL_TABLET | Freq: Every day | ORAL | Status: DC
  Administered 2015-05-05 – 2015-05-07 (×3): 500 mg via ORAL
  Filled 2015-05-05 (×3): qty 1

## 2015-05-05 MED ORDER — PHENOL 1.4 % MT LIQD: 1.0000 | OROMUCOSAL | Status: DC | PRN

## 2015-05-05 MED ORDER — DIAZEPAM 5 MG PO TABS
5.0000 mg | ORAL_TABLET | Freq: Three times a day (TID) | ORAL | Status: DC | PRN
  Administered 2015-05-05 – 2015-05-06 (×2): 5 mg via ORAL
  Filled 2015-05-05 (×3): qty 1

## 2015-05-05 MED ORDER — ALTEPLASE 2 MG IJ SOLR
2.0000 mg | Freq: Once | INTRAMUSCULAR | Status: DC | PRN
  Filled 2015-05-05: qty 2

## 2015-05-05 MED ORDER — CHLORHEXIDINE GLUCONATE CLOTH 2 % EX PADS: 6.0000 | MEDICATED_PAD | Freq: Once | CUTANEOUS | Status: DC

## 2015-05-05 MED ORDER — DILTIAZEM HCL ER COATED BEADS 120 MG PO CP24
120.0000 mg | ORAL_CAPSULE | Freq: Every day | ORAL | Status: DC
  Administered 2015-05-05 – 2015-05-07 (×3): 120 mg via ORAL
  Filled 2015-05-05 (×3): qty 1

## 2015-05-05 SURGICAL SUPPLY — 36 items
BANDAGE ELASTIC 4 VELCRO ST LF (GAUZE/BANDAGES/DRESSINGS) IMPLANT
BANDAGE ELASTIC 6 VELCRO ST LF (GAUZE/BANDAGES/DRESSINGS) IMPLANT
BNDG GAUZE ELAST 4 BULKY (GAUZE/BANDAGES/DRESSINGS) ×2 IMPLANT
CANISTER SUCTION 2500CC (MISCELLANEOUS) ×3 IMPLANT
CANISTER WOUND CARE 500ML ATS (WOUND CARE) ×2 IMPLANT
CONT SPECI 4OZ STER CLIK (MISCELLANEOUS) ×2 IMPLANT
COVER SURGICAL LIGHT HANDLE (MISCELLANEOUS) ×3 IMPLANT
DRAPE INCISE IOBAN 66X45 STRL (DRAPES) IMPLANT
DRAPE ORTHO SPLIT 77X108 STRL (DRAPES) ×6
DRAPE PROXIMA HALF (DRAPES) ×1 IMPLANT
DRAPE SURG ORHT 6 SPLT 77X108 (DRAPES) IMPLANT
DRSG VAC ATS SM SENSATRAC (GAUZE/BANDAGES/DRESSINGS) ×3 IMPLANT
ELECT REM PT RETURN 9FT ADLT (ELECTROSURGICAL) ×3
ELECTRODE REM PT RTRN 9FT ADLT (ELECTROSURGICAL) ×1 IMPLANT
GAUZE SPONGE 4X4 12PLY STRL (GAUZE/BANDAGES/DRESSINGS) ×3 IMPLANT
GLOVE BIO SURGEON STRL SZ 6.5 (GLOVE) ×1 IMPLANT
GLOVE BIO SURGEON STRL SZ7.5 (GLOVE) ×3 IMPLANT
GLOVE BIO SURGEONS STRL SZ 6.5 (GLOVE) ×1
GLOVE BIOGEL PI IND STRL 6.5 (GLOVE) IMPLANT
GLOVE BIOGEL PI IND STRL 8 (GLOVE) ×1 IMPLANT
GLOVE BIOGEL PI INDICATOR 6.5 (GLOVE) ×4
GLOVE BIOGEL PI INDICATOR 8 (GLOVE) ×2
GOWN STRL REUS W/ TWL LRG LVL3 (GOWN DISPOSABLE) ×3 IMPLANT
GOWN STRL REUS W/TWL LRG LVL3 (GOWN DISPOSABLE) ×9
KIT BASIN OR (CUSTOM PROCEDURE TRAY) ×3 IMPLANT
KIT ROOM TURNOVER OR (KITS) ×3 IMPLANT
NS IRRIG 1000ML POUR BTL (IV SOLUTION) ×3 IMPLANT
PACK CV ACCESS (CUSTOM PROCEDURE TRAY) IMPLANT
PACK GENERAL/GYN (CUSTOM PROCEDURE TRAY) IMPLANT
PAD ARMBOARD 7.5X6 YLW CONV (MISCELLANEOUS) ×6 IMPLANT
SUT ETHILON 3 0 PS 1 (SUTURE) IMPLANT
SUT VIC AB 2-0 CTB1 (SUTURE) IMPLANT
SUT VIC AB 3-0 SH 27 (SUTURE)
SUT VIC AB 3-0 SH 27X BRD (SUTURE) IMPLANT
SUT VICRYL 4-0 PS2 18IN ABS (SUTURE) IMPLANT
WATER STERILE IRR 1000ML POUR (IV SOLUTION) ×3 IMPLANT

## 2015-05-05 NOTE — H&P (View-Only) (Signed)
[facility]  Patient name: [name] MRN: [id number] DOB: 05/30/1960 Sex: female  REASON FOR CONSULT: follow up of left heel wound.  HPI: [name] is a 55 y.o. female, with an extensive nonhealing wound of her left heel. She previously underwent atherectomy with angioplasty of the left anterior tibial artery and left popliteal artery with drug coated balloon angioplasty to the left superficial femoral/popliteal artery by Dr. Trula Slade on 01/11/15. She had excellent results from this. She was last seen in the office on 02/16/15 where her left heel ulcer was debrided. She has no further revascularization options. The patient was offered a below-knee-amputation but she was very opposed to this. We also discussed debridement of the wound and placement of a negative pressure dressing which she was against. She's been having home health to do dressing changes and comes in for a 3 week follow up visit.  He denies fever or chills. She has some cramping in her leg but her pain is tolerable.  Past Medical History  Diagnosis Date  . ESRD (end stage renal disease) ([place])   . Hypertension   . CVA (cerebral infarction)     right parietal 05/19/2000  . Vaginal bleeding   . Hyperparathyroidism   . Pneumonia   . Stroke ([place])     2002,no residual  . Peripheral vascular disease ([place])   . GERD (gastroesophageal reflux disease)   . Anemia   . DM (diabetes mellitus) type II controlled peripheral vascular disorder ([place])   . Slip/trip w/o falling due to stepping on object, subs July  2014    Family History  Problem Relation Age of Onset  . Hypertension Father   . Kidney disease Mother     SOCIAL HISTORY: Social History   Social History  . Marital Status: Single    Spouse Name: N/A  . Number of Children: N/A  . Years of Education: N/A   Occupational History  . Not on file.   Social History Main Topics  . Smoking status: Never Smoker   . Smokeless  tobacco: Never Used  . Alcohol Use: No  . Drug Use: No  . Sexual Activity: No   Other Topics Concern  . Not on file   Social History Narrative    Allergies  Allergen Reactions  . Doxycycline Itching  . Peroxyl [Hydrogen Peroxide] Hives and Rash    Current Outpatient Prescriptions  Medication Sig Dispense Refill  . acetaminophen (TYLENOL) 325 MG tablet Take 650 mg by mouth every Monday, Wednesday, and Friday. With dialysis    . CALCIUM-VITAMIN D PO Take 500 mg by mouth 3 (three) times daily with meals.     . Clopidogrel Bisulfate (PLAVIX PO) Take 75 mg by mouth daily.     . Cyanocobalamin (VITAMIN B-12 PO) Take 1 tablet by mouth 4 (four) times a week. Non dialysis days. Tues, Thurs, Sat and Sunday    . diazepam (VALIUM) 5 MG tablet Take 5 mg by mouth every 8 (eight) hours as needed for muscle spasms.   0  . diltiazem (CARDIZEM CD) 120 MG 24 hr capsule Take 1 capsule (120 mg total) by mouth daily. 60 capsule 0  . diphenhydrAMINE (BENADRYL) 25 MG tablet Take 25 mg by mouth every 6 (six) hours as needed (Muscle spasm in feet).    Marland Kitchen glipiZIDE (GLUCOTROL) 10 MG tablet Take 0.5 tablets (5 mg total) by mouth daily. 45 tablet 3  . multivitamin (RENA-VIT) TABS tablet Take 1 tablet by mouth  daily.    . neomycin-bacitracin-polymyxin (NEOSPORIN) [phone] ointment Apply 1 application topically daily. Apply to foot    . polyethylene glycol (MIRALAX / GLYCOLAX) packet Take 17 g by mouth daily. (Patient taking differently: Take 17 g by mouth daily as needed for mild constipation. ) 14 each 0  . traMADol (ULTRAM) 50 MG tablet Take 50 mg by mouth 2 (two) times daily as needed for moderate pain (for foot pain). For foot pain  0  . vitamin C (ASCORBIC ACID) 500 MG tablet Take 500 mg by mouth daily.    . vitamin E 400 UNIT capsule Take 400 Units by mouth daily.    Marland Kitchen warfarin (COUMADIN) 5 MG tablet Take 1.5 tablets (7.5 mg total) by mouth daily at 6 PM. 60 tablet 0   No current facility-administered  medications for this visit.    REVIEW OF SYSTEMS:  [X]  denotes positive finding, [ ]  denotes negative finding Cardiac  Comments:  Chest pain or chest pressure:    Shortness of breath upon exertion:    Short of breath when lying flat:    Irregular heart rhythm:        Vascular    Pain in calf, thigh, or hip brought on by ambulation:    Pain in feet at night that wakes you up from your sleep:     Blood clot in your veins:    Leg swelling:         Pulmonary    Oxygen at home:    Productive cough:     Wheezing:         Neurologic    Sudden weakness in arms or legs:     Sudden numbness in arms or legs:     Sudden onset of difficulty speaking or slurred speech:    Temporary loss of vision in one eye:     Problems with dizziness:         Gastrointestinal    Blood in stool:     Vomited blood:         Genitourinary    Burning when urinating:     Blood in urine:        Psychiatric    Major depression:         Hematologic    Bleeding problems:    Problems with blood clotting too easily:        Skin    Rashes or ulcers: X Left heel      Constitutional    Fever or chills:      PHYSICAL EXAM: Filed Vitals:   04/13/15 1559  BP: 118/72  Pulse: 104  Temp: 97.7 F (36.5 C)  TempSrc: Oral  Height: 5\' 2"  (1.575 m)  Weight: 152 lb (68.947 kg)  SpO2: 100%    GENERAL: The patient is a well-nourished female, in no acute distress. The vital signs are documented above. CARDIAC: There is a regular rate and rhythm.  VASCULAR: She has palpable femoral pulses. I cannot palpate pedal pulses. She has no significant lower extremity swelling. PULMONARY: There is good air exchange bilaterally without wheezing or rales. ABDOMEN: Soft and non-tender with normal pitched bowel sounds.  MUSCULOSKELETAL: There are no major deformities or cyanosis. NEUROLOGIC: No focal weakness or paresthesias are detected. SKIN: The ulcer on her left heel measures 4 cm in diameter by 4-1/2 cm in  diameter. The wrist some granulation tissue but at the base of the wound there is a small amount of exposed bone. PSYCHIATRIC: The patient  has a normal affect.  MEDICAL ISSUES:   NONHEALING LEFT HEEL WOUND: She has no further options for revascularization on the left. Given the exposed bone I think the best approach would be a primary below-the-knee amputation. She will not agree to this. I think the only alternative would be to debride the wound and place a negative pressure dressing with a small chance of success. She is now agreeable to this. He has been started on Coumadin for atrial fibrillation. We will have to hold this for 3-4 days preoperatively. She dialyzes on Monday Wednesdays and Fridays and we'll schedule this on a nondialysis day. She has been scheduled for 04/26/2015 which is a Tuesday. She'll need to be admitted and then hopefully we can make arrangements for her to have the negative pressure dressing continued at home.   Deitra Mayo [facility] [place]: [phone]

## 2015-05-05 NOTE — Anesthesia Postprocedure Evaluation (Signed)
Anesthesia Post Note  Patient: [name]  Procedure(s) Performed: Procedure(s) (LRB): DEBRIDEMENT HEEL WOUND (Left) APPLICATION OF WOUND VAC (Left)  Patient location during evaluation: PACU Anesthesia Type: General Level of consciousness: awake and alert Pain management: pain level controlled Vital Signs Assessment: post-procedure vital signs reviewed and stable Respiratory status: spontaneous breathing, nonlabored ventilation, respiratory function stable and patient connected to nasal cannula oxygen Cardiovascular status: blood pressure returned to baseline and stable Postop Assessment: no signs of nausea or vomiting Anesthetic complications: no    Last Vitals:  Filed Vitals:   05/05/15 1300 05/05/15 1315  BP: 122/67 133/69  Pulse: 91 94  Temp:    Resp: 16 12    Last Pain:  Filed Vitals:   05/05/15 1323  PainSc: Asleep                 Emanual Lamountain,W. EDMOND

## 2015-05-05 NOTE — Transfer of Care (Signed)
Immediate Anesthesia Transfer of Care Note  Patient: [name]  Procedure(s) Performed: Procedure(s): DEBRIDEMENT HEEL WOUND (Left) APPLICATION OF WOUND VAC (Left)  Patient Location: PACU  Anesthesia Type:General  Level of Consciousness: awake, alert  and oriented  Airway & Oxygen Therapy: Patient Spontanous Breathing and Patient connected to nasal cannula oxygen  Post-op Assessment: Report given to RN and Post -op Vital signs reviewed and stable  Post vital signs: Reviewed and stable  Last Vitals:  Filed Vitals:   05/05/15 0754  BP: 172/66  Pulse: 102  Temp: 37.6 C  Resp: 20    Complications: No apparent anesthesia complications

## 2015-05-05 NOTE — Anesthesia Preprocedure Evaluation (Addendum)
Anesthesia Evaluation  Patient identified by MRN, date of birth, ID band Patient awake    Reviewed: Allergy & Precautions, H&P , NPO status , Patient's Chart, lab work & pertinent test results  Airway Mallampati: II  TM Distance: >3 FB Neck ROM: Full    Dental no notable dental hx. (+) Partial Lower, Partial Upper, Dental Advisory Given   Pulmonary neg pulmonary ROS,    Pulmonary exam normal breath sounds clear to auscultation       Cardiovascular hypertension, Pt. on medications + Peripheral Vascular Disease  negative cardio ROS   Rhythm:Regular Rate:Normal     Neuro/Psych CVA negative psych ROS   GI/Hepatic Neg liver ROS, GERD  Medicated and Controlled,  Endo/Other  diabetes, Type 2, Oral Hypoglycemic Agents  Renal/GU ESRF and DialysisRenal disease  negative genitourinary   Musculoskeletal   Abdominal   Peds  Hematology negative hematology ROS (+)   Anesthesia Other Findings   Reproductive/Obstetrics negative OB ROS                            Anesthesia Physical Anesthesia Plan  ASA: III  Anesthesia Plan: General   Post-op Pain Management:    Induction: Intravenous  Airway Management Planned: LMA  Additional Equipment:   Intra-op Plan:   Post-operative Plan: Extubation in OR  Informed Consent: I have reviewed the patients History and Physical, chart, labs and discussed the procedure including the risks, benefits and alternatives for the proposed anesthesia with the patient or authorized representative who has indicated his/her understanding and acceptance.   Dental advisory given  Plan Discussed with: CRNA  Anesthesia Plan Comments:         Anesthesia Quick Evaluation

## 2015-05-05 NOTE — Anesthesia Procedure Notes (Signed)
Procedure Name: LMA Insertion Date/Time: 05/05/2015 9:42 AM Performed by: Eligha Bridegroom Pre-anesthesia Checklist: Patient identified, Timeout performed, Emergency Drugs available, Suction available and Patient being monitored Patient Re-evaluated:Patient Re-evaluated prior to inductionOxygen Delivery Method: Circle system utilized Preoxygenation: Pre-oxygenation with 100% oxygen Intubation Type: IV induction LMA: LMA inserted LMA Size: 4.0 Tube secured with: Tape Dental Injury: Teeth and Oropharynx as per pre-operative assessment

## 2015-05-05 NOTE — Progress Notes (Signed)
Patient arrived to unit, oriented to floor, placed on telemetry, and assessed. VS were stable patient doe c/o pain.

## 2015-05-05 NOTE — Interval H&P Note (Signed)
History and Physical Interval Note:  05/05/2015 7:32 AM  [name]  has presented today for surgery, with the diagnosis of Nonhealing left heel wound L97.409  The various methods of treatment have been discussed with the patient and family. After consideration of risks, benefits and other options for treatment, the patient has consented to  Procedure(s): DEBRIDEMENT HEEL WOUND (Left) APPLICATION OF WOUND VAC (Left) as a surgical intervention .  The patient's history has been reviewed, patient examined, no change in status, stable for surgery.  I have reviewed the patient's chart and labs.  Questions were answered to the patient's satisfaction.     Deitra Mayo

## 2015-05-05 NOTE — Progress Notes (Signed)
UR Completed. Codi Folkerts, RN, BSN.  [phone] 

## 2015-05-05 NOTE — Consult Note (Signed)
[facility] Renal Consultation Note    Indication for Consultation:  Management of ESRD/hemodialysis; anemia, hypertension/volume and secondary hyperparathyroidism PCP:  HPI: [name] is a 55 y.o. female with ESRD, DM, hx CVA, HTN, PVD, s/p left transmet well healed and nonhealing left heel wound followed by VVS. She has been resistant to the recommended BKA.  She is admitted following debridement today with [facility] application and is due for HD tomorrow on her usually MWF schedule.  She has been shortening her tmts up to 30 min the last few weeks. She denies SOB, CP, fever, chills, N, V, D.  Appetite has been ok.  She is currently in pain waiting for her RN to bring her some med.  Past Medical History  Diagnosis Date  . Hypertension   . CVA (cerebral infarction)     right parietal 05/19/2000  . Vaginal bleeding   . Hyperparathyroidism   . Pneumonia   . Stroke ([place])     2002,no residual  . Peripheral vascular disease ([place])   . GERD (gastroesophageal reflux disease)   . Anemia   . DM (diabetes mellitus) type II controlled peripheral vascular disorder ([place])   . Slip/trip w/o falling due to stepping on object, subs July  2014  . ESRD (end stage renal disease) ([place])     dialysis M/W/F  . Constipation   . Complication of anesthesia     Morphine makes her nauseated, states she has small veins   Past Surgical History  Procedure Laterality Date  . Parathyroidectomy with autotransplant  12/07/2010  . Knee surgery Right X 2  . Brachial artery graft      x2 for dialysis  . Av fistula placement Right     upper thigh  . Dental extractions Bilateral   . Dilation and curettage of uterus  1986  . 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 Leg surgery Right   . Foot surgery Left     5 TOES AMPUTATED  . Abdominal aortagram N/A 05/27/2012   Procedure: ABDOMINAL Maxcine Ham;  Surgeon: Serafina Mitchell, MD;  Location[facility] CATH LAB;  Service: Cardiovascular;  Laterality: N/A;  . Lower extremity angiogram Left 05/27/2012    Procedure: LOWER EXTREMITY ANGIOGRAM;  Surgeon: Serafina Mitchell, MD;  Location: [facility] CATH LAB;  Service: Cardiovascular;  Laterality: Left;  lt leg angio  . Tooth extraction  Sep 08, 2014  . Peripheral vascular catheterization N/A 01/11/2015    Procedure: Lower Extremity Angiography;  Surgeon: Serafina Mitchell, MD;  Location: [place] CV LAB;  Service: Cardiovascular;  Laterality: N/A;   Family History  Problem Relation Age of Onset  . Hypertension Father   . Kidney disease Mother    Social History:  reports that she has never smoked. She has never used smokeless tobacco. She reports that she does not drink alcohol or use illicit drugs. Allergies  Allergen Reactions  . Doxycycline Itching  . Peroxyl [Hydrogen Peroxide] Hives and Rash   Prior to Admission medications   Medication Sig Start Date End Date Taking? Authorizing Provider  acetaminophen (TYLENOL) 325 MG tablet Take 650 mg by mouth every Monday, Wednesday, and Friday. With dialysis   Yes Historical Provider, MD  CALCIUM-VITAMIN D PO Take 500 mg by mouth 3 (three) times daily with meals.    Yes Historical Provider, MD  Clopidogrel Bisulfate (PLAVIX PO) Take 75 mg  by mouth daily.    Yes Historical Provider, MD  Cyanocobalamin (VITAMIN B-12 PO) Take 1 tablet by mouth 4 (four) times a week. Non dialysis days. Cain Saupe, Sat and Sunday   Yes Historical Provider, MD  diazepam (VALIUM) 5 MG tablet Take 5 mg by mouth every 8 (eight) hours as needed for muscle spasms.  11/15/14  Yes Historical Provider, MD  diltiazem (CARDIZEM CD) 120 MG 24 hr capsule Take 1 capsule (120 mg total) by mouth daily. 03/27/15  Yes Shanker Kristeen Mans, MD  diphenhydrAMINE (BENADRYL) 25 MG tablet Take 25 mg by mouth every 6 (six) hours as needed (Muscle spasm in feet).   Yes Historical  Provider, MD  glipiZIDE (GLUCOTROL) 10 MG tablet Take 0.5 tablets (5 mg total) by mouth daily. 11/23/14  Yes Lance Bosch, NP  multivitamin (RENA-VIT) TABS tablet Take 1 tablet by mouth daily.   Yes Historical Provider, MD  traMADol (ULTRAM) 50 MG tablet Take 50 mg by mouth 2 (two) times daily as needed for moderate pain (for foot pain). For foot pain 07/26/14  Yes Historical Provider, MD  vitamin C (ASCORBIC ACID) 500 MG tablet Take 500 mg by mouth daily.   Yes Historical Provider, MD  vitamin E 400 UNIT capsule Take 400 Units by mouth daily.   Yes Historical Provider, MD  warfarin (COUMADIN) 5 MG tablet Take 1.5 tablets (7.5 mg total) by mouth daily at 6 PM. 03/27/15  Yes Shanker Kristeen Mans, MD  neomycin-bacitracin-polymyxin (NEOSPORIN) [phone] ointment Apply 1 application topically daily. Apply to foot    Historical Provider, MD  polyethylene glycol (MIRALAX / GLYCOLAX) packet Take 17 g by mouth daily. Patient taking differently: Take 17 g by mouth daily as needed for mild constipation.  08/19/14   Reyne Dumas, MD   Current Facility-Administered Medications  Medication Dose Route Frequency Provider Last Rate Last Dose  . [START ON 05/06/2015] acetaminophen (TYLENOL) tablet 650 mg  650 mg Oral Q M,W,F Kimberly A Trinh, PA-C      . ceFAZolin (ANCEF) IVPB 1 g/50 mL premix  1 g Intravenous 3 times per day Alvia Grove, PA-C      . [START ON 05/06/2015] clopidogrel (PLAVIX) tablet 75 mg  75 mg Oral Daily Kimberly A Trinh, PA-C      . diazepam (VALIUM) 5 MG tablet           . diazepam (VALIUM) tablet 5 mg  5 mg Oral Q8H PRN Alvia Grove, PA-C   5 mg at 05/05/15 1104  . diltiazem (CARDIZEM CD) 24 hr capsule 120 mg  120 mg Oral Daily Alvia Grove, PA-C      . [START ON 05/06/2015] glipiZIDE (GLUCOTROL) tablet 5 mg  5 mg Oral QPC breakfast Alvia Grove, PA-C      . heparin injection 5,000 Units  5,000 Units Subcutaneous 3 times per day Alvia Grove, PA-C      . HYDROmorphone (DILAUDID)  1 MG/ML injection           . HYDROmorphone (DILAUDID) 1 MG/ML injection           . morphine 2 MG/ML injection 1-3 mg  1-3 mg Intravenous Q3H PRN Alvia Grove, PA-C      . multivitamin (RENA-VIT) tablet 1 tablet  1 tablet Oral Daily Kimberly A Trinh, PA-C      . polyethylene glycol (MIRALAX / GLYCOLAX) packet 17 g  17 g Oral Daily PRN Alvia Grove, PA-C      .  traMADol (ULTRAM) tablet 50 mg  50 mg Oral Q12H PRN Alvia Grove, PA-C      . vitamin C (ASCORBIC ACID) tablet 500 mg  500 mg Oral Daily Alvia Grove, PA-C      . [START ON 05/06/2015] vitamin E capsule 400 Units  400 Units Oral Daily Alvia Grove, PA-C      . [START ON 05/06/2015] warfarin (COUMADIN) tablet 7.5 mg  7.5 mg Oral q1800 Alvia Grove, PA-C       Labs: Basic Metabolic Panel:  Recent Labs Lab 05/05/15 0811  NA 137  K 4.8  CL 96*  CO2 25  GLUCOSE 111*  BUN 32*  CREATININE 8.31*  CALCIUM 9.3   Liver Function Tests:  Recent Labs Lab 05/05/15 0811  AST 19  ALT 19  ALKPHOS 72  BILITOT 0.7  PROT 8.3*  ALBUMIN 3.3*   CBC:  Recent Labs Lab 05/05/15 0811  WBC 8.3  HGB 11.9*  HCT 37.0  MCV 92.7  PLT 260   CBG:  Recent Labs Lab 05/05/15 0732 05/05/15 1038  GLUCAP 99 99    ROS: As per HPI otherwise negative.  Physical Exam: Filed Vitals:   05/05/15 1300 05/05/15 1315 05/05/15 1343 05/05/15 1344  BP: 122/67 133/69 145/92   Pulse: 91 94    Temp:   97.8 F (36.6 C)   TempSrc:   Oral   Resp: 16 12 16    Height:      Weight:      SpO2: 100% 100% 98% 100%     General: Well developed, well nourished, in no acute distress. Head: Normocephalic, atraumatic, sclera non-icteric, mucus membranes are moist Neck: Supple. JVD not elevated. Lungs: Clear bilaterally to auscultation without wheezes, rales, or rhonchi.  Heart: RRR with S1 S2. 2/6 murmur Abdomen: Soft, non-tender, non-distended with normoactive bowel sounds. No rebound/guarding.  Lower extremities: left foot wrapped  with VAC s/p prior transmet-well healed; right foot no edema or wounds Neuro: Alert and oriented X 3. Moves all extremities spontaneously. Psych:  Responds to questions appropriately with a normal affect Dialysis Access:right thigh AVGG + bruit  Dialysis Orders:  NW MWF 3.5 hours  hr 450/A 1.5 EDW 67.5 2 K 2.25 Ca profile 2 right thigh AVGG heparin 1000 no ESA, Fe or VDRA Recent labs:  Hgb 11.9 40% iPTH 166 Ca/P ok  Assessment/Plan: 1. s/p debridement of left chronic heel wound with VAC - per VVS; cultures done - on empiric Ancef - hx MRSA PCR  2. ESRD -  MWF - HD tomorrow K 4.8 running short 2.75 - 3.25 - not getting adequate tmts - hold heparin 3. Hypertension/volume  - BP starts high pre HD 190 - 200 then drops soon into tmt - may need to raise EDW slightly - only gets to about 68.4 post HD due to short HD - post BP 110s usually 4. Anemia  - Hgb 11.9 no ESA or Fe for now 5. Metabolic bone disease -  Ca 9.3 continue binders - no VDRA 6. Nutrition - alb 3.3 - last outpt alb 4 -  7. MRSA precautions - not sure this was being follow at outpt unit 8. Afib - previously on coumadin followed by coumadin clinic- resumed - on [place], Hershal Coria [place] [phone] 05/05/2015, 1:53 PM   Pt seen, examined and agree w A/P as above.  Kelly Splinter MD [employer] pager [phone]    cell [phone] 05/05/2015, 3:42  PM

## 2015-05-05 NOTE — Op Note (Signed)
    NAME: Marzelle Arabia    MRN: [id number] DOB: 28-Jan-1961    DATE OF OPERATION: 05/05/2015  PREOP DIAGNOSIS: Nonhealing wound of the left heel.  POSTOP DIAGNOSIS: same  PROCEDURE: excisional debridement of skin and soft tissue and bone left heel with placement of VAC  SURGEON: Judeth Cornfield. [name], MD, FACS  ASSIST: none  ANESTHESIA: Gen.   EBL: minimal  INDICATIONS: Burgundy Ewald is a 55 y.o. female who I'm following with a nonhealing wound of the left heel. She has undergone tibial interventionand has reasonable blood flow to the left foot but has an extensive wound of the heel with exposed bone. Given the exposed bone and I felt that she would best be served with a below the knee amputation.However the patient absolutely refused to consider this and I felt the only option would be debridement of the wound and placement of a negative pressure dressing over the exposed bone. I thought this had a small chance for success.  FINDINGS: The tissue appeared recently well perfused.   TECHNIQUE: The patient was taken to the operating room and received a general anesthetic. The left foot was prepped and draped in usual sterile fashion. Using a scalpel I sharply excised some devitalized skin circumferentially around the wound. I also debrided sharply expose subcutaneous tissue which was devitalized and some bone was debris did sharply also.the wound had excellent perfusion and was irrigated. A negative pressure dressing was then placed and there was a good seal. Patient tolerated the procedure well and transferred to recovery room in stable condition. All needle and sponge counts were correct.  Deitra Mayo, MD, FACS [facility]  DATE OF DICTATION:   05/05/2015

## 2015-05-06 ENCOUNTER — Encounter (HOSPITAL_COMMUNITY): Payer: Self-pay | Admitting: Vascular Surgery

## 2015-05-06 ENCOUNTER — Telehealth: Payer: Self-pay | Admitting: Vascular Surgery

## 2015-05-06 LAB — CBC
HCT: 34.6 % — ABNORMAL LOW (ref 36.0–46.0)
Hemoglobin: 11 g/dL — ABNORMAL LOW (ref 12.0–15.0)
MCH: 29.1 pg (ref 26.0–34.0)
MCHC: 31.8 g/dL (ref 30.0–36.0)
MCV: 91.5 fL (ref 78.0–100.0)
PLATELETS: 286 10*3/uL (ref 150–400)
RBC: 3.78 MIL/uL — AB (ref 3.87–5.11)
RDW: 15.2 % (ref 11.5–15.5)
WBC: 8.2 10*3/uL (ref 4.0–10.5)

## 2015-05-06 LAB — GLUCOSE, CAPILLARY
Glucose-Capillary: 108 mg/dL — ABNORMAL HIGH (ref 65–99)
Glucose-Capillary: 105 mg/dL — ABNORMAL HIGH (ref 65–99)
Glucose-Capillary: 268 mg/dL — ABNORMAL HIGH (ref 65–99)

## 2015-05-06 LAB — BASIC METABOLIC PANEL
Anion gap: 16 — ABNORMAL HIGH (ref 5–15)
BUN: 41 mg/dL — AB (ref 6–20)
CO2: 25 mmol/L (ref 22–32)
Calcium: 8.8 mg/dL — ABNORMAL LOW (ref 8.9–10.3)
Chloride: 97 mmol/L — ABNORMAL LOW (ref 101–111)
Creatinine, Ser: 10.53 mg/dL — ABNORMAL HIGH (ref 0.44–1.00)
GFR calc Af Amer: 4 mL/min — ABNORMAL LOW (ref >60)
GFR, EST NON AFRICAN AMERICAN: 4 mL/min — AB (ref >60)
GLUCOSE: 148 mg/dL — AB (ref 65–99)
POTASSIUM: 5.3 mmol/L — AB (ref 3.5–5.1)
Sodium: 138 mmol/L (ref 135–145)

## 2015-05-06 LAB — PROTIME-INR
INR: 1.27 (ref 0.00–1.49)
Prothrombin Time: 16.1 seconds — ABNORMAL HIGH (ref 11.6–15.2)

## 2015-05-06 MED ORDER — DIPHENHYDRAMINE HCL 25 MG PO CAPS
25.0000 mg | ORAL_CAPSULE | Freq: Four times a day (QID) | ORAL | Status: DC | PRN
  Administered 2015-05-06: 25 mg via ORAL
  Filled 2015-05-06: qty 1

## 2015-05-06 MED ORDER — TRAMADOL HCL 50 MG PO TABS: 50.0000 mg | ORAL_TABLET | Freq: Four times a day (QID) | ORAL | Status: DC | PRN

## 2015-05-06 MED ORDER — CIPROFLOXACIN HCL 500 MG PO TABS: 500.0000 mg | ORAL_TABLET | Freq: Two times a day (BID) | ORAL | Status: DC

## 2015-05-06 NOTE — Evaluation (Signed)
Occupational Therapy Evaluation Patient Details Name: [name] MRN: [id number] DOB: 09/22/1960 Today's Date: 05/06/2015    History of Present Illness Patient is a 55 y/o female with hx of CVA, anemia, HTN, PVD, DM and ESRD on HD and chronic non healing wound on left heel s/p excisional debridement of wound and application of wound vac.   Clinical Impression   Patient presenting with decreased ADL and functional mobility independence secondary to above. Patient independent to mod I PTA. Patient currently functioning at a min assist to max assist level. Patient will benefit from acute OT to increase overall independence in the areas of ADLs, functional mobility, and overall safety in order to safely discharge to venue listed below. Pt currently lives alone with an aide coming to assist ~2 hours per day in the afternoon 5X per week. Pt unsafe to discharge home alone at this time and will need 24/7 supervision/assistance. At this time, am recommending ST SNF for patient to reach a mod I level so she can safely discharge home when appropriate.     Follow Up Recommendations  SNF;Supervision/Assistance - 24 hour    Equipment Recommendations  Other (comment) (TBD next venue of care)    Recommendations for Other Services  None at this time   Precautions / Restrictions Precautions Precautions: Fall Precaution Comments: wound vac Restrictions Weight Bearing Restrictions: No Other Position/Activity Restrictions: No WB restrictions on precautions in chart about foot?? Treated as NWB due to pain.    Mobility Bed Mobility Overal bed mobility: Needs Assistance Bed Mobility: Supine to Sit     Supine to sit: Min guard;HOB elevated     General bed mobility comments: Increased time to get to EOB. Use of rails.   Transfers Overall transfer level: Needs assistance Equipment used: Rolling walker (2 wheeled) Transfers: Sit to/from Stand Sit to Stand: Min assist         General transfer  comment: Min assist to stand from EOB. Pt not placing weight through LLE due to pain. Pt with difficulty standing up straight, pt hunched over RW during sit to stand. Pt only able to maintain static standing ~30 seconds before needing to sit back down.    Balance Overall balance assessment: Needs assistance Sitting-balance support: No upper extremity supported;Single extremity supported Sitting balance-Leahy Scale: Fair Sitting balance - Comments: Assist to donn sock and dressing to LLE   Standing balance support: During functional activity Standing balance-Leahy Scale: Poor Standing balance comment: Reliant on BUE support for balance. Mod A for balance to donn pants with LOB x2.     ADL Overall ADL's : Needs assistance/impaired Eating/Feeding: Set up;Sitting   Grooming: Set up;Sitting   Upper Body Bathing: Min guard;Sitting   Lower Body Bathing: Moderate assistance;Sit to/from stand   Upper Body Dressing : Min guard;Sitting   Lower Body Dressing: Maximal assistance;Sit to/from Health and safety inspector Details (indicate cue type and reason): did not occur due to pain   Toileting - Clothing Manipulation Details (indicate cue type and reason): did not occur due to pain   Tub/Shower Transfer Details (indicate cue type and reason): did not occur due to pain Functional mobility during ADLs: Minimal assistance;Cueing for safety;Cueing for sequencing;Rolling walker General ADL Comments: Pt limited by pain. NO WB restricitions or orders in chart, pt unable to to WB through LLE due to increased pain.     Pertinent Vitals/Pain Pain Assessment: Faces Faces Pain Scale: Hurts whole lot Pain Location: left foot in dependent position Pain Descriptors /  Indicators: Sore;Throbbing Pain Intervention(s): Limited activity within patient's tolerance;Monitored during session;Repositioned     Hand Dominance Right   Extremity/Trunk Assessment Upper Extremity Assessment Upper Extremity  Assessment: Generalized weakness   Lower Extremity Assessment Lower Extremity Assessment: Generalized weakness       Communication Communication Communication: No difficulties   Cognition Arousal/Alertness: Awake/alert Behavior During Therapy: Flat affect Overall Cognitive Status: Impaired/Different from baseline Area of Impairment: Safety/judgement;Problem solving Safety/Judgement: Decreased awareness of safety;Decreased awareness of deficits   Problem Solving: Slow processing                Home Living Family/patient expects to be discharged to:: Private residence Living Arrangements: Alone Available Help at Discharge: Personal care attendant;Available PRN/intermittently Type of Home: Apartment Home Access: Stairs to enter Entrance Stairs-Number of Steps: 3 Entrance Stairs-Rails: Left Home Layout: One level     Bathroom Shower/Tub: Occupational psychologist: Standard     Home Equipment: Environmental consultant - 2 wheels;Walker - 4 wheels;Bedside commode;Shower seat   Additional Comments: Aide comes 5 days/wk and assists with ADLs, housework, gets her groceries; pt reports aide is only there ~2 hours per day      Prior Functioning/Environment Level of Independence: Needs assistance  Gait / Transfers Assistance Needed: Reports taking bus to dialysis. Uses RW for ambulation. ADL's / Homemaking Assistance Needed: Has aide 3x/wk to assist with cleaning and cooking. Sounds like she mostly sponge bathes and sometimes uses shower (reports she sometimes has someone there when she gets in shower but not all the time)    OT Diagnosis: Generalized weakness;Acute pain   OT Problem List: Decreased strength;Decreased activity tolerance;Impaired balance (sitting and/or standing);Decreased coordination;Decreased safety awareness;Decreased knowledge of use of DME or AE;Pain   OT Treatment/Interventions: Self-care/ADL training;Therapeutic exercise;Energy conservation;DME and/or AE  instruction;Therapeutic activities;Patient/family education;Balance training    OT Goals(Current goals can be found in the care plan section) Acute Rehab OT Goals Patient Stated Goal: to go to rehab then home OT Goal Formulation: With patient Time For Goal Achievement: 05/20/15 Potential to Achieve Goals: Good ADL Goals Pt Will Perform Grooming: with supervision;standing Pt Will Perform Lower Body Bathing: with set-up;sit to/from stand;with supervision Pt Will Perform Lower Body Dressing: with set-up;with supervision;sit to/from stand Pt Will Transfer to Toilet: with supervision;stand pivot transfer;bedside commode Pt/caregiver will Perform Home Exercise Program: Increased strength;Both right and left upper extremity;With theraband;With written HEP provided;With Supervision Additional ADL Goal #1: Pt will be supervision with functional mobility using LRAD  OT Frequency: Min 2X/week   Barriers to D/C: Decreased caregiver support;Inaccessible home environment   End of Session Equipment Utilized During Treatment: Rolling walker Nurse Communication: Other (comment) (SNF recommendation)  Activity Tolerance: Patient limited by pain Patient left: in bed;with call bell/phone within reach;with family/visitor present (seated EOB)   Time: [id number] OT Time Calculation (min): 24 min Charges:  OT General Charges $OT Visit: 1 Procedure OT Evaluation $OT Eval Moderate Complexity: 1 Procedure OT Treatments $Self Care/Home Management : 8-22 mins  Chrys Racer , MS, OTR/L, CLT Pager: [phone]  05/06/2015, 2:04 PM

## 2015-05-06 NOTE — Telephone Encounter (Signed)
-----   Message from Mena Goes, RN sent at 05/06/2015  2:53 PM EST ----- Regarding: schedule   ----- Message -----    From: Alvia Grove, PA-C    Sent: 05/06/2015  11:17 AM      To: Vvs Charge Pool  S/p debridement left heel wound 05/05/15  F/u with [facility] in 2 weeks  Thanks Maudie Mercury

## 2015-05-06 NOTE — Telephone Encounter (Signed)
No answer, no vm, mailed letter, dpm °

## 2015-05-06 NOTE — Care Management Note (Addendum)
Case Management Note Marvetta Gibbons RN, BSN Unit 2W-Case Manager [phone]  Patient Details  Name: [name] MRN: [id number] Date of Birth: 23-Mar-1961  Subjective/Objective:   Pt admitted- Post-Op s/p: Excisional debridement of left heel wound and placement of negative pressure dressing.                 Action/Plan: PTA pt lived at home alone, hx ESRD- HD-M/W/F- referral received for Wound VAC- order form has been signed for [facility] wound VAC- spoke with Jermaine at [facility]- he will begin process to arrange [facility] for home- per PA-Kim pt ready for d/c once HH and VAC arrangements have been made- pt for HD today- not sure what time have asked bedside RN to see if she can find out pending on timing of HD today- may be able to get pt home later today- vs tomorrow with wound VAC and HH- spoke with pt at bedside- per pt she has used [place] in the past for [facility] services- she would like to use them again for [place] services- referral called to [facility] with [name] - NCM to continue to follow for d/c arrangements  Expected Discharge Date:                  Expected Discharge Plan:  [place]  In-House Referral:     Discharge planning Services  CM Consult  Post Acute Care Choice:  Durable Medical Equipment, Home Health Choice offered to:  Patient  DME Arranged:  Vac DME Agency:  [place] Arranged:  RN, PT/OT [facility] Agency:  [place]  Status of Service:  Completed, signed off  Medicare Important Message Given:    Date Medicare IM Given:    Medicare IM give by:    Date Additional Medicare IM Given:    Additional Medicare Important Message give by:     If discussed at [place] of Stay Meetings, dates discussed:    Discharge Disposition: Home/Home Health   Additional Comments:  05/06/15- 1400- update- per PT/OT notes recommendations for STSNF- spoke with pt's nurse who states pt was a stand by assist with walker to chair. Pt does not have a  qualifying stay per Medicare guidelines for STSNF - call made to [facility]. Trinh -PA- who states pt remains medically stable for d/c- does not have a medical reason to keep her- plan remains to d/c after HD tx. - discussed any precautions needed for foot - pt will be partial wb- forefoot only- PA to place additional [place] orders- Call made to HD who report that they will be coming to get pt shortly- bedside RN aware of plan. Home wound VAC is in place on pt- supplies in room to be sent with pt on discharge. Pt states that she has a RW at home- (aide come 2-3 hr/day 5-7 days/week)- notified [facility] with [name] about additional [facility] needs for HH-PT/OT.    Dawayne Patricia, RN 05/06/2015, 10:28 AM

## 2015-05-06 NOTE — Evaluation (Addendum)
Physical Therapy Evaluation Patient Details Name: [name] MRN: [id number] DOB: April 17, 1961 Today's Date: 05/06/2015   History of Present Illness  Patient is a 55 y/o female with hx of CVA, anemia, HTN, PVD, DM and ESRD on HD and chronic non healing wound on left heel s/p excisional debridement of wound and application of wound vac.  Clinical Impression  Patient presents with lethargy, decreased strength, balance, endurance and safety awareness impacting safe mobility. Pt lives alone and has steps to climb to get into apt. PTA, pt using RW for ambulation and has aide for 3 hrs/day to assist with IADLs. Takes bus to dialysis. Pt not safe to return home at this time due to safety concerns. LOB x2 in standing and requires Min A to take a few hops to the chair. Will try to determine any restrictions or WB status for LLE however not sure this will help mobility as pt limited by pain. Would benefit from ST SNF to maximize independence and mobility prior to return home.    Follow Up Recommendations SNF    Equipment Recommendations  Other (comment) (defer to next venue)    Recommendations for Other Services OT consult     Precautions / Restrictions Precautions Precautions: Fall Precaution Comments: wound vac Restrictions Weight Bearing Restrictions: No Other Position/Activity Restrictions: No WB restrictions on precautions in chart about foot?? Treated as NWB due to pain.      Mobility  Bed Mobility Overal bed mobility: Needs Assistance Bed Mobility: Supine to Sit     Supine to sit: Min guard;HOB elevated     General bed mobility comments: Increased time to get to EOB. Use of rails.   Transfers Overall transfer level: Needs assistance Equipment used: Rolling walker (2 wheeled) Transfers: Sit to/from Stand Sit to Stand: Mod assist;Min assist         General transfer comment: Initially Mod A to boost from EOB as pt not placing weight through LLE. LOB when trying to donn  pants in standing. On second attempt, Min A to boost to standing.   Ambulation/Gait Ambulation/Gait assistance: Min assist Ambulation Distance (Feet): 3 Feet Assistive device: Rolling walker (2 wheeled) Gait Pattern/deviations: Trunk flexed ("hop to") Gait velocity: decreased Gait velocity interpretation: Below normal speed for age/gender General Gait Details: Able to take a few hops to the chair with Min A for balance. Lethargic due to medication. Pt NWB LLE due to pain.  Stairs            Wheelchair Mobility    Modified Rankin (Stroke Patients Only)       Balance Overall balance assessment: Needs assistance Sitting-balance support: Feet supported;Bilateral upper extremity supported Sitting balance-Leahy Scale: Fair Sitting balance - Comments: Assist to donn shoe   Standing balance support: During functional activity Standing balance-Leahy Scale: Poor Standing balance comment: Reliant on BUE support for balance. Mod A for balance to donn pants with LOB x2.                              Pertinent Vitals/Pain Pain Assessment: Faces Faces Pain Scale: Hurts whole lot Pain Location: left foot in dependent position Pain Descriptors / Indicators: Sore;Throbbing Pain Intervention(s): Monitored during session;Repositioned;Limited activity within patient's tolerance;Premedicated before session    [place] expects to be discharged to:: Private residence Living Arrangements: Alone Available Help at Discharge: Personal care attendant;Available PRN/intermittently Type of Home: Apartment Home Access: Stairs to enter Entrance Stairs-Rails: Left Entrance Stairs-Number  of Steps: 3 Home Layout: One level Home Equipment: [place] - 2 wheels;Walker - 4 wheels;Bedside commode;Shower seat Additional Comments: Aide comes 5 days/wk and assists with ADLs, housework, gets her groceries    Prior Function Level of Independence: Needs assistance   Gait /  Transfers Assistance Needed: Reports taking bus to dialysis. Uses RW for ambulation.           Hand Dominance        Extremity/Trunk Assessment   Upper Extremity Assessment: Defer to OT evaluation           Lower Extremity Assessment: Generalized weakness         Communication   Communication: No difficulties  Cognition Arousal/Alertness: Lethargic;Suspect due to medications Behavior During Therapy: Flat affect Overall Cognitive Status: Impaired/Different from baseline Area of Impairment: Safety/judgement;Problem solving         Safety/Judgement: Decreased awareness of safety;Decreased awareness of deficits   Problem Solving: Slow processing      General Comments      Exercises        Assessment/Plan    PT Assessment Patient needs continued PT services  PT Diagnosis Difficulty walking;Acute pain;Generalized weakness   PT Problem List Decreased strength;Decreased balance;Decreased mobility;Decreased knowledge of precautions;Decreased safety awareness;Decreased cognition;Pain  PT Treatment Interventions Balance training;Gait training;Functional mobility training;Therapeutic activities;Therapeutic exercise;Wheelchair mobility training;Patient/family education;Stair training   PT Goals (Current goals can be found in the Care Plan section) Acute Rehab PT Goals Patient Stated Goal: to go to rehab then home PT Goal Formulation: With patient Time For Goal Achievement: 05/20/15 Potential to Achieve Goals: Fair    Frequency Min 3X/week   Barriers to discharge Decreased caregiver support;Inaccessible home environment Lives alone and has 3 steps to climb to get into apt    Co-evaluation               End of Session Equipment Utilized During Treatment: Gait belt Activity Tolerance: Patient limited by lethargy;Patient limited by pain Patient left: in chair;with call bell/phone within reach Nurse Communication: Mobility status;Precautions;Weight bearing  status (Discussed no WB status or precautions with LLE?)         Time: [id number] PT Time Calculation (min) (ACUTE ONLY): 22 min   Charges:   PT Evaluation $PT Eval Moderate Complexity: 1 Procedure     PT G Codes:        [name] 05/06/2015, 12:40 PM Wray Kearns, [place], DPT [phone]

## 2015-05-06 NOTE — Progress Notes (Signed)
   [facility] Progress Note   Subjective: no complaint  Filed Vitals:   05/05/15 1343 05/05/15 1344 05/05/15 2028 05/06/15 0427  BP: 145/92  139/59 187/73  Pulse:   94 99  Temp: 97.8 F (36.6 C)  98.5 F (36.9 C) 98.4 F (36.9 C)  TempSrc: Oral  Oral Oral  Resp: 16  17 16   Height:      Weight:      SpO2: 98% 100% 100% 100%    Inpatient medications: . acetaminophen  650 mg Oral Q M,W,F  .  ceFAZolin (ANCEF) IV  1 g Intravenous Q24H  . clopidogrel  75 mg Oral Daily  . diltiazem  120 mg Oral Daily  . docusate sodium  100 mg Oral Daily  . glipiZIDE  5 mg Oral QPC breakfast  . insulin aspart  0-9 Units Subcutaneous TID WC  . multivitamin  1 tablet Oral QHS  . pantoprazole  40 mg Oral Daily  . vitamin C  500 mg Oral Daily  . vitamin E  400 Units Oral Daily  . warfarin  7.5 mg Oral q1800  . Warfarin - Physician Dosing Inpatient   Does not apply q1800     sodium chloride, sodium chloride, sodium chloride, acetaminophen **OR** acetaminophen, alteplase, alum & mag hydroxide-simeth, diazepam, diphenhydrAMINE, guaiFENesin-dextromethorphan, heparin, hydrALAZINE, labetalol, lidocaine (PF), lidocaine-prilocaine, magnesium sulfate 1 - 4 g bolus IVPB, metoprolol, morphine injection, ondansetron, pentafluoroprop-tetrafluoroeth, phenol, polyethylene glycol, potassium chloride, traMADol  Exam: Alert, no distress, sitting up No jvd Chest clear bilat RRR tachy no mrg Abd soft ntnd no ascites L foot wrapped w drain to VAC, L TMA No LE edema RLE unremarkable Neuro alert, nonfocal  NW MWF   3.5h  67.5kg   2/2.25 bath   P2  R thigh AVG  Heparin 1000 No meds  Hb 11.9  pth 166      Assessment: 1 I&D L heel wound w VAC - on Ancef 2 ESRD  3 HTN.vol is under dry wt, BP up 4 Anemia no esa 5 MBD cont binders 6 Afib on coum/dilt 7 DM on glipizide  Plan - HD today   Elizabeth Splinter MD [facility] pager [phone]    cell [phone] 05/06/2015, 10:35 AM     Recent Labs Lab 05/05/15 0811 05/05/15 1844 05/06/15 0424  NA 137 137 138  K 4.8 5.0 5.3*  CL 96* 96* 97*  CO2 25 28 25   GLUCOSE 111* 156* 148*  BUN 32* 34* 41*  CREATININE 8.31* 9.41* 10.53*  CALCIUM 9.3 9.0 8.8*  PHOS  --  5.7*  --     Recent Labs Lab 05/05/15 0811 05/05/15 1844  AST 19  --   ALT 19  --   ALKPHOS 72  --   BILITOT 0.7  --   PROT 8.3*  --   ALBUMIN 3.3* 3.0*    Recent Labs Lab 05/05/15 0811 05/05/15 1844 05/06/15 0424  WBC 8.3 7.3 8.2  HGB 11.9* 11.3* 11.0*  HCT 37.0 35.0* 34.6*  MCV 92.7 92.1 91.5  PLT 260 278 286

## 2015-05-06 NOTE — Progress Notes (Signed)
Pt returned from dialysis and was unable to contact anyone who can give her a ride tonight.  Pt very tearful and is complaining of severe pain.  MD Bridgett Larsson notified that patient is unable to leave the hospital tonight.  Pt also states that she does not want to go home alone and would prefer to go to a facility.  RN will cont to monitor pt closely.

## 2015-05-06 NOTE — Progress Notes (Signed)
   VASCULAR SURGERY ASSESSMENT & PLAN:  * 1 Day Post-Op s/p: Excisional debridement of left heel wound and placement of negative pressure dressing.  *  Home when arrangements can be made for the [facility] at home.   * RENAL: Hemodialysis on Monday Wednesday and Friday per nephrology.  * She is requesting Benadryl which I have ordered.  * Continue IV Ancef while she is in the hospital for cellulitis of wound. Once she is ready for discharge she can be converted to po cipro.  SUBJECTIVE: Pain well controlled.   PHYSICAL EXAM: Filed Vitals:   05/05/15 1343 05/05/15 1344 05/05/15 2028 05/06/15 0427  BP: 145/92  139/59 187/73  Pulse:   94 99  Temp: 97.8 F (36.6 C)  98.5 F (36.9 C) 98.4 F (36.9 C)  TempSrc: Oral  Oral Oral  Resp: 16  17 16   Height:      Weight:      SpO2: 98% 100% 100% 100%   VAC with good seal.   LABS: Lab Results  Component Value Date   WBC 8.2 05/06/2015   HGB 11.0* 05/06/2015   HCT 34.6* 05/06/2015   MCV 91.5 05/06/2015   PLT 286 05/06/2015   Lab Results  Component Value Date   CREATININE 10.53* 05/06/2015   Lab Results  Component Value Date   INR 1.27 05/06/2015   CBG (last 3)   Recent Labs  05/05/15 1636 05/05/15 2023 05/06/15 0651  GLUCAP 98 198* 108*    Active Problems:   Nonhealing nonsurgical wound    [name] Beeper: [id number] 05/06/2015

## 2015-05-06 NOTE — Progress Notes (Signed)
Returned Diazepam 5 mg to [facility] in  [place], because patient has went to dialysis and has been taken from our unit pyxis.  [name] 7:00 PM

## 2015-05-07 LAB — GLUCOSE, CAPILLARY
GLUCOSE-CAPILLARY: 77 mg/dL (ref 65–99)
Glucose-Capillary: 146 mg/dL — ABNORMAL HIGH (ref 65–99)
Glucose-Capillary: 76 mg/dL (ref 65–99)

## 2015-05-07 LAB — PROTIME-INR
INR: 1.2 (ref 0.00–1.49)
PROTHROMBIN TIME: 15.4 s — AB (ref 11.6–15.2)

## 2015-05-07 MED ORDER — CEFAZOLIN SODIUM 1-5 GM-% IV SOLN
1.0000 g | Freq: Once | INTRAVENOUS | Status: AC
  Administered 2015-05-07: 1 g via INTRAVENOUS
  Filled 2015-05-07 (×2): qty 50

## 2015-05-07 NOTE — Progress Notes (Addendum)
      Filed Vitals:   05/06/15 1857 05/06/15 1906 05/06/15 2043 05/07/15 0446  BP: 121/70 127/71 152/62 153/79  Pulse: 97 100 110 116  Temp:  97.6 F (36.4 C) 98.7 F (37.1 C) 99.5 F (37.5 C)  TempSrc:  Oral Oral Oral  Resp: 16 16 18 20   Height:      Weight:  160 lb 7.9 oz (72.8 kg)    SpO2:   100% 100%    Excisional debridement of left heel wound and placement of negative pressure dressing.  Wound vac in place working well Pending ride home patient will be discharged home today.  HH orders in place.  COLLINS, EMMA MAUREEN  PA-C  Addendum  I have independently interviewed and examined the patient, and I agree with the physician assistant's findings.  Pt's HD session ran over last night.  Ok to d/c now  Adele Barthel, MD [facility] Office: [phone] Pager: [phone]  05/07/2015, 9:27 AM

## 2015-05-07 NOTE — Progress Notes (Signed)
[facility] Progress Note  Assessment/Plan: 1. s/p debridement of left chronic heel wound with VAC - per VVS; cultures staph aureus - sensitivities pending- on empiric Ancef - hx MRSA PCR - Temp 99.5 at d/c WBC no increase discussed with pharmacy; plan redose another gram Ancef today - check final sensitivities on Monday at HD, continue Ancef or convert to Vanc if MRSA - duration of treatment at least 14 days - to f/u with [facility]VS within 2 weeks 2. ESRD - MWF - held heparin; resume 1000 heparin at d/c 3. Hypertension/volume -UF 1.1 Friday on HD with post wt 72.8 - not accurate - suspect due to [facility] - continue same EDW at dc 4. Anemia - Hgb 11. no ESA or Fe for now - monitor per protocol after d/c for resuming ESA 5. Metabolic bone disease - Ca 8.8 continue binders - no VDRA 6. Nutrition - alb 3.0 - last outpt alb 4 -  7. MRSA precautions - not sure this was being follow at outpt unit- will clarify at d/c 8. Afib - previously on coumadin followed by coumadin clinic- resumed- titrating up - on [place], PA-C [place] [phone] 05/07/2015,11:42 AM  LOS: 2 days   Pt seen, examined and agree w A/P as above.  Kelly Splinter MD [place] pager [phone]    cell [phone] 05/07/2015, 2:30 PM    Subjective:   Waiting for a friend to take her home. Takes transportation to HD. Not sure how she is going to manage at home. (Lives alone, but doesn't really want SNF)  Objective Filed Vitals:   05/06/15 1906 05/06/15 2043 05/07/15 0446 05/07/15 1000  BP: 127/71 152/62 153/79 121/62  Pulse: 100 110 116   Temp: 97.6 F (36.4 C) 98.7 F (37.1 C) 99.5 F (37.5 C)   TempSrc: Oral Oral Oral   Resp: 16 18 20    Height:      Weight: 72.8 kg (160 lb 7.9 oz)     SpO2:  100% 100%    Physical Exam General: NAD, though uncomfortable Heart :tachy - SR on tele rate low 100s Lungs: no rales Abdomen: soft Extremities: no LE edema;  left heel VAC Dialysis Access: right thigh AVGG + bruit  Dialysis Orders: NW MWF 3.5 hours hr 450/A 1.5 EDW 67.5 2 K 2.25 Ca profile 2 right thigh AVGG heparin 1000 no ESA, Fe or VDRA Recent labs: Hgb 11.9 40% iPTH 166 Ca/P ok   Additional Objective Labs: Lab Results  Component Value Date   INR 1.20 05/07/2015   INR 1.27 05/06/2015   INR 1.24 [id number]    Basic Metabolic Panel:  Recent Labs Lab 05/05/15 0811 05/05/15 1844 05/06/15 0424  NA 137 137 138  K 4.8 5.0 5.3*  CL 96* 96* 97*  CO2 25 28 25   GLUCOSE 111* 156* 148*  BUN 32* 34* 41*  CREATININE 8.31* 9.41* 10.53*  CALCIUM 9.3 9.0 8.8*  PHOS  --  5.7*  --    Liver Function Tests:  Recent Labs Lab 05/05/15 0811 05/05/15 1844  AST 19  --   ALT 19  --   ALKPHOS 72  --   BILITOT 0.7  --   PROT 8.3*  --   ALBUMIN 3.3* 3.0*   CBC:  Recent Labs Lab 05/05/15 0811 05/05/15 1844 05/06/15 0424  WBC 8.3 7.3 8.2  HGB 11.9* 11.3* 11.0*  HCT 37.0 35.0* 34.6*  MCV 92.7 92.1 91.5  PLT 260 278  286   Blood Culture    Component Value Date/Time   SDES TISSUE LEFT HEEL 05/05/2015 0959   SPECREQUEST NONE 05/05/2015 0959   CULT  05/05/2015 0959    ABUNDANT STAPHYLOCOCCUS AUREUS Note: RIFAMPIN AND GENTAMICIN SHOULD NOT BE USED AS SINGLE DRUGS FOR TREATMENT OF STAPH INFECTIONS. Performed at [facility] PENDING 05/05/2015 0959    CBG:  Recent Labs Lab 05/06/15 0651 05/06/15 1145 05/06/15 2228 05/07/15 0610 05/07/15 1131  GLUCAP 108* 105* 268* 77 146*  Medications:   . acetaminophen  650 mg Oral Q M,W,F  .  ceFAZolin (ANCEF) IV  1 g Intravenous Q24H  . clopidogrel  75 mg Oral Daily  . diltiazem  120 mg Oral Daily  . docusate sodium  100 mg Oral Daily  . glipiZIDE  5 mg Oral QPC breakfast  . insulin aspart  0-9 Units Subcutaneous TID WC  . multivitamin  1 tablet Oral QHS  . pantoprazole  40 mg Oral Daily  . vitamin C  500 mg Oral Daily  . vitamin E  400 Units Oral Daily  .  warfarin  7.5 mg Oral q1800  . Warfarin - Physician Dosing Inpatient   Does not apply [phone]

## 2015-05-07 NOTE — Progress Notes (Signed)
Physical Therapy Treatment Patient Details Name: [name] MRN: [id number] DOB: [birth year] Today's Date: 05/07/2015    History of Present Illness Patient is a 55 y/o female with hx of CVA, anemia, HTN, PVD, DM and ESRD on HD and chronic non healing wound on left heel s/p excisional debridement of wound and application of wound vac.    PT Comments    Pt progressing with mobility slowly but still limited by pain, decreased strength, & safety awareness.  Per chart & RN, plans are for pt to d/c home today however this clinician does not feel pt safe to d/c home alone as she was only able to hop approx 15' with 4 wheeled walker requiring CGA, requires CGA for transfers, and exhibits poor safety awareness as she tries to stand on 1 leg with no UE support to donn shirt.  Advised pt to sit to complete tasks but she states "this is how i'm going to do it at home".    Follow Up Recommendations  SNF     Equipment Recommendations       Recommendations for Other Services       Precautions / Restrictions Precautions Precautions: Fall Precaution Comments: wound vac Restrictions Other Position/Activity Restrictions: No WB restrictions on precautions in chart about foot?? Treated as NWB due to pain.    Mobility  Bed Mobility Overal bed mobility: Needs Assistance Bed Mobility: Supine to Sit     Supine to sit: Supervision;HOB elevated     General bed mobility comments: assistance & cues for awareness of wound vac tubing  Transfers Overall transfer level: Needs assistance Equipment used: 4-wheeled walker Transfers: Sit to/from Stand Sit to Stand: Min guard         General transfer comment: cues for hand placement & guarding for safety  Ambulation/Gait Ambulation/Gait assistance: Min guard Ambulation Distance (Feet): 15 Feet Assistive device: 4-wheeled walker Gait Pattern/deviations: Step-to pattern Gait velocity: decreased   General Gait Details: pt rested Lt knee on seat  of rollator and hopped forwards   Stairs            Wheelchair Mobility    Modified Rankin (Stroke Patients Only)       Balance Overall balance assessment: Needs assistance Sitting-balance support: Single extremity supported Sitting balance-Leahy Scale: Good                              Cognition Arousal/Alertness: Awake/alert Behavior During Therapy: Flat affect Overall Cognitive Status: No family/caregiver present to determine baseline cognitive functioning Area of Impairment: Safety/judgement;Problem solving         Safety/Judgement: Decreased awareness of safety;Decreased awareness of deficits   Problem Solving: Slow processing;Decreased initiation;Difficulty sequencing;Requires verbal cues;Requires tactile cues      Exercises      General Comments        Pertinent Vitals/Pain Pain Assessment: Faces Faces Pain Scale: Hurts whole lot Pain Location: Lt foot  Pain Descriptors / Indicators: Pressure;Throbbing Pain Intervention(s): Monitored during session;Repositioned    Home Living                      Prior Function            PT Goals (current goals can now be found in the care plan section) Acute Rehab PT Goals Patient Stated Goal: to go to rehab then home PT Goal Formulation: With patient Time For Goal Achievement: 05/20/15 Potential to Achieve Goals: Fair Progress towards  PT goals: Progressing toward goals    Frequency  Min 3X/week    PT Plan Current plan remains appropriate    Co-evaluation             End of Session Equipment Utilized During Treatment: Gait belt Activity Tolerance: Patient limited by fatigue;Patient limited by pain Patient left: in bed (sitting EOB per pt's request)     Time: 1057-1130 PT Time Calculation (min) (ACUTE ONLY): 33 min  Charges:  $Gait Training: 8-22 mins $Therapeutic Activity: 8-22 mins                    G Codes:      Sena Hitch 05/07/2015, 12:25  PM   Sarajane Marek, PTA [phone] 05/07/2015

## 2015-05-07 NOTE — Progress Notes (Signed)
CSW notified that patient will d/c home this afternoon and will require EMS transport. She has a wound vac to her left heel. Nursing indicates that patient lives alone but has people to come check on her. She relates to nursing that she had her house key. Will require an IV antibiotic does prior to d/c per nursing; EMS arranged for pick up at 4 pm and CMS paperwork completed.  No further CSW needs identified. CSW signing off. Lorie Phenix. Pauline Good, [place]  (weekend overage)

## 2015-05-07 NOTE — Progress Notes (Signed)
Patient D/C to home via ambulance.  IV and telemetry D/C'd. Patient's walker at front desk because ambulance can not transport it. Pt states her neighbor or aid will come and pick it up later.

## 2015-05-08 DIAGNOSIS — I120 Hypertensive chronic kidney disease with stage 5 chronic kidney disease or end stage renal disease: Secondary | ICD-10-CM | POA: Diagnosis not present

## 2015-05-08 DIAGNOSIS — L97421 Non-pressure chronic ulcer of left heel and midfoot limited to breakdown of skin: Secondary | ICD-10-CM | POA: Diagnosis not present

## 2015-05-08 DIAGNOSIS — N186 End stage renal disease: Secondary | ICD-10-CM | POA: Diagnosis not present

## 2015-05-08 DIAGNOSIS — E1122 Type 2 diabetes mellitus with diabetic chronic kidney disease: Secondary | ICD-10-CM | POA: Diagnosis not present

## 2015-05-08 DIAGNOSIS — I4891 Unspecified atrial fibrillation: Secondary | ICD-10-CM | POA: Diagnosis not present

## 2015-05-08 DIAGNOSIS — E1151 Type 2 diabetes mellitus with diabetic peripheral angiopathy without gangrene: Secondary | ICD-10-CM | POA: Diagnosis not present

## 2015-05-09 ENCOUNTER — Telehealth: Payer: Self-pay

## 2015-05-09 DIAGNOSIS — N186 End stage renal disease: Secondary | ICD-10-CM | POA: Diagnosis not present

## 2015-05-09 DIAGNOSIS — E1129 Type 2 diabetes mellitus with other diabetic kidney complication: Secondary | ICD-10-CM | POA: Diagnosis not present

## 2015-05-09 DIAGNOSIS — D509 Iron deficiency anemia, unspecified: Secondary | ICD-10-CM | POA: Diagnosis not present

## 2015-05-09 DIAGNOSIS — A4902 Methicillin resistant Staphylococcus aureus infection, unspecified site: Secondary | ICD-10-CM | POA: Diagnosis not present

## 2015-05-09 DIAGNOSIS — N2581 Secondary hyperparathyroidism of renal origin: Secondary | ICD-10-CM | POA: Diagnosis not present

## 2015-05-09 DIAGNOSIS — D631 Anemia in chronic kidney disease: Secondary | ICD-10-CM | POA: Diagnosis not present

## 2015-05-09 LAB — TISSUE CULTURE

## 2015-05-09 NOTE — Telephone Encounter (Signed)
Phone call to [place] at the [facility] and made aware of the pt's heel culture results, and Dr. Nicole Cella order for Vanco to be given at HD.  Will fax records of recent OR report, culture, and order by Dr. [name] to the [facility].  Caryl Pina verb. Understanding.

## 2015-05-09 NOTE — Telephone Encounter (Signed)
-----   Message from Gena Fray sent at 05/09/2015  3:01 PM EST ----- Regarding: FW: antibiotics I think Dr [name] meant for this message to go to the clinical team. See below.  Thanks, Hinton Dyer ----- Message -----    From: Angelia Mould, MD    Sent: 05/09/2015   9:28 AM      To: [facility] Admin Pool Subject: antibiotics                                    This patients culture grew MRSA. SHe should be getting Vanco at the time of HD. Thanks CD

## 2015-05-10 ENCOUNTER — Encounter: Payer: Self-pay | Admitting: Internal Medicine

## 2015-05-10 ENCOUNTER — Ambulatory Visit: Payer: Medicare Other | Attending: Internal Medicine | Admitting: Internal Medicine

## 2015-05-10 ENCOUNTER — Ambulatory Visit (INDEPENDENT_AMBULATORY_CARE_PROVIDER_SITE_OTHER): Payer: Medicare Other | Admitting: Pharmacist Clinician (PhC)/ Clinical Pharmacy Specialist

## 2015-05-10 VITALS — BP 140/88 | HR 90 | Temp 98.0°F | Resp 16 | Ht 62.0 in | Wt 160.0 lb

## 2015-05-10 DIAGNOSIS — E119 Type 2 diabetes mellitus without complications: Secondary | ICD-10-CM

## 2015-05-10 DIAGNOSIS — I480 Paroxysmal atrial fibrillation: Secondary | ICD-10-CM | POA: Diagnosis not present

## 2015-05-10 DIAGNOSIS — I4891 Unspecified atrial fibrillation: Secondary | ICD-10-CM

## 2015-05-10 DIAGNOSIS — L97903 Non-pressure chronic ulcer of unspecified part of unspecified lower leg with necrosis of muscle: Secondary | ICD-10-CM | POA: Diagnosis not present

## 2015-05-10 LAB — GLUCOSE, POCT (MANUAL RESULT ENTRY): POC GLUCOSE: 131 mg/dL — AB (ref 70–99)

## 2015-05-10 LAB — POCT INR: INR: 1.2

## 2015-05-10 NOTE — Progress Notes (Signed)
Patient ID: [name], female   DOB: [birth year], 55 y.o.   MRN: [id number]  CC: HFU  HPI: [name] is a 55 y.o. female here today for a follow up visit.  Patient has past medical history of of HTN, DM, CVA 2002, PVD, ESRD, hyperparathyroidism, seen in-hosp 12/02 for afib, RVR and chest pain. D/c on diltiazem and warfarin and was in SR. Patient was also in the hospital on 1/12-1/14 for a left heel debridement by Dr. Bobette Mo. Patient was discharged with a wound vac and was found to have a culture positive for MRSA so now she is due to receive Vancomycin on dialysis days. She has not started Vancomycin yet. She is due to return to HD tomorrow. HD days on M, W, F. Patient states that she has a Psychologist, prison and probation services who comes out several times per week for wound care who also does her INR levels. She was due for a INR check today by the RN but states that she will likely need to reschedule due to being here. Last INR was 3 days ago and was sub therapeutic and is the same today. She states that she just started back on Coumadin 4 days ago since surgery and she is unsure what dose she is on now.   Patient has No headache, No chest pain, No abdominal pain - No Nausea, No new weakness tingling or numbness, No Cough - SOB.  Allergies  Allergen Reactions  . Doxycycline Itching  . Peroxyl [Hydrogen Peroxide] Hives and Rash   Past Medical History  Diagnosis Date  . Hypertension   . CVA (cerebral infarction)     right parietal 05/19/2000  . Vaginal bleeding   . Hyperparathyroidism   . Pneumonia   . Stroke ([place])     2002,no residual  . Peripheral vascular disease ([place])   . GERD (gastroesophageal reflux disease)   . Anemia   . DM (diabetes mellitus) type II controlled peripheral vascular disorder ([place])   . Slip/trip w/o falling due to stepping on object, subs July  2014  . ESRD (end stage renal disease) ([place])     dialysis M/W/F  . Constipation   . Complication of anesthesia     Morphine makes her  nauseated, states she has small veins   Current Outpatient Prescriptions on File Prior to Visit  Medication Sig Dispense Refill  . acetaminophen (TYLENOL) 325 MG tablet Take 650 mg by mouth every Monday, Wednesday, and Friday. With dialysis    . CALCIUM-VITAMIN D PO Take 500 mg by mouth 3 (three) times daily with meals.     . ciprofloxacin (CIPRO) 500 MG tablet Take 1 tablet (500 mg total) by mouth 2 (two) times daily. 28 tablet 0  . Clopidogrel Bisulfate (PLAVIX PO) Take 75 mg by mouth daily.     . Cyanocobalamin (VITAMIN B-12 PO) Take 1 tablet by mouth 4 (four) times a week. Non dialysis days. Tues, Thurs, Sat and Sunday    . diazepam (VALIUM) 5 MG tablet Take 5 mg by mouth every 8 (eight) hours as needed for muscle spasms.   0  . diltiazem (CARDIZEM CD) 120 MG 24 hr capsule Take 1 capsule (120 mg total) by mouth daily. 60 capsule 0  . glipiZIDE (GLUCOTROL) 10 MG tablet Take 0.5 tablets (5 mg total) by mouth daily. 45 tablet 3  . multivitamin (RENA-VIT) TABS tablet Take 1 tablet by mouth daily.    . traMADol (ULTRAM) 50 MG tablet Take 1 tablet (50 mg total) by  mouth every 6 (six) hours as needed for moderate pain (for foot pain). For foot pain 30 tablet 0  . vitamin C (ASCORBIC ACID) 500 MG tablet Take 500 mg by mouth daily.    . vitamin E 400 UNIT capsule Take 400 Units by mouth daily.    Marland Kitchen warfarin (COUMADIN) 5 MG tablet Take 1.5 tablets (7.5 mg total) by mouth daily at 6 PM. 60 tablet 0  . diphenhydrAMINE (BENADRYL) 25 MG tablet Take 25 mg by mouth every 6 (six) hours as needed (Muscle spasm in feet).    . neomycin-bacitracin-polymyxin (NEOSPORIN) [phone] ointment Apply 1 application topically daily. Apply to foot    . polyethylene glycol (MIRALAX / GLYCOLAX) packet Take 17 g by mouth daily. (Patient taking differently: Take 17 g by mouth daily as needed for mild constipation. ) 14 each 0   No current facility-administered medications on file prior to visit.   Family History  Problem  Relation Age of Onset  . Hypertension Father   . Kidney disease Mother    Social History   Social History  . Marital Status: Single    Spouse Name: N/A  . Number of Children: N/A  . Years of Education: N/A   Occupational History  . Not on file.   Social History Main Topics  . Smoking status: Never Smoker   . Smokeless tobacco: Never Used  . Alcohol Use: No  . Drug Use: No  . Sexual Activity: No   Other Topics Concern  . Not on file   Social History Narrative    Review of Systems: Constitutional: Negative for fever, chills, diaphoresis, activity change, appetite change and fatigue. HENT: Negative for ear pain, nosebleeds, congestion, facial swelling, rhinorrhea, neck pain, neck stiffness and ear discharge.  Eyes: Negative for pain, discharge, redness, itching and visual disturbance. Respiratory: Negative for cough, choking, chest tightness, shortness of breath, wheezing and stridor.  Cardiovascular: Negative for chest pain, palpitations and leg swelling. Gastrointestinal: Negative for abdominal distention. Genitourinary: Negative for dysuria, urgency, frequency, hematuria, flank pain, decreased urine volume, difficulty urinating and dyspareunia.  Musculoskeletal: Negative for back pain, joint swelling, arthralgias and gait problem. Neurological: Negative for dizziness, tremors, seizures, syncope, facial asymmetry, speech difficulty, weakness, light-headedness, numbness and headaches.  Hematological: Negative for adenopathy. Does not bruise/bleed easily. Psychiatric/Behavioral: Negative for hallucinations, behavioral problems, confusion, dysphoric mood, decreased concentration and agitation.    Objective:   Filed Vitals:   05/10/15 1447  BP: 140/88  Pulse: 90  Temp: 98 F (36.7 C)  Resp: 16    Physical Exam  Constitutional: She is oriented to person, place, and time.  Cardiovascular: Normal rate, regular rhythm and normal heart sounds.   In NSR  Pulmonary/Chest:  Effort normal and breath sounds normal.  Musculoskeletal: Normal range of motion.  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  Skin: Skin is warm and dry.     Lab Results  Component Value Date   WBC 8.2 05/06/2015   HGB 11.0* 05/06/2015   HCT 34.6* 05/06/2015   MCV 91.5 05/06/2015   PLT 286 05/06/2015   Lab Results  Component Value Date   CREATININE 10.53* 05/06/2015   BUN 41* 05/06/2015   NA 138 05/06/2015   K 5.3* 05/06/2015   CL 97* 05/06/2015   CO2 25 05/06/2015    Lab Results  Component Value Date   HGBA1C 7.4* 03/25/2015   Lipid Panel  Component Value Date/Time   CHOL 136 10/27/2013 1121   TRIG 74 10/27/2013 1121   HDL 48 10/27/2013 1121   CHOLHDL 2.8 10/27/2013 1121   VLDL 15 10/27/2013 1121   LDLCALC 73 10/27/2013 1121       Assessment and plan:   Kwanzaa was seen today for hospitalization follow-up.  Diagnoses and all orders for this visit:  PAF (paroxysmal atrial fibrillation) ([place]) -     INR Patient INR is only 1.2. I have tried to call [name] RN to find out what dose of Coumadin she is on. Nursing staff was told by [name] that they will call her later today to make changes to her regimen. Continue coumadin clinic and cardiology follow up   Ulcer of lower limb, unspecified laterality, with necrosis of muscle ([place]) Continue follow up with Vascular. Explained that her antibiotics should be changed and per EMR vascular has sent orders to dialysis center.  Type 2 diabetes mellitus without complication, without long-term current use of insulin ([facility]) -     Glucose (CBG) Last A1C was 2 months ago and was 7.4%. I would prefer her at a 7%. I will continue to monitor patient.      Return in about 3 months (around 08/08/2015).   Lance Bosch, [facility] [phone] 05/10/2015, 3:05 PM

## 2015-05-10 NOTE — Progress Notes (Signed)
Patient here for follow up from the hospital For her A fib

## 2015-05-11 DIAGNOSIS — A4902 Methicillin resistant Staphylococcus aureus infection, unspecified site: Secondary | ICD-10-CM | POA: Diagnosis not present

## 2015-05-11 DIAGNOSIS — D509 Iron deficiency anemia, unspecified: Secondary | ICD-10-CM | POA: Diagnosis not present

## 2015-05-11 DIAGNOSIS — I4891 Unspecified atrial fibrillation: Secondary | ICD-10-CM | POA: Diagnosis not present

## 2015-05-11 DIAGNOSIS — E1151 Type 2 diabetes mellitus with diabetic peripheral angiopathy without gangrene: Secondary | ICD-10-CM | POA: Diagnosis not present

## 2015-05-11 DIAGNOSIS — D631 Anemia in chronic kidney disease: Secondary | ICD-10-CM | POA: Diagnosis not present

## 2015-05-11 DIAGNOSIS — E1122 Type 2 diabetes mellitus with diabetic chronic kidney disease: Secondary | ICD-10-CM | POA: Diagnosis not present

## 2015-05-11 DIAGNOSIS — L97421 Non-pressure chronic ulcer of left heel and midfoot limited to breakdown of skin: Secondary | ICD-10-CM | POA: Diagnosis not present

## 2015-05-11 DIAGNOSIS — N186 End stage renal disease: Secondary | ICD-10-CM | POA: Diagnosis not present

## 2015-05-11 DIAGNOSIS — I120 Hypertensive chronic kidney disease with stage 5 chronic kidney disease or end stage renal disease: Secondary | ICD-10-CM | POA: Diagnosis not present

## 2015-05-11 DIAGNOSIS — N2581 Secondary hyperparathyroidism of renal origin: Secondary | ICD-10-CM | POA: Diagnosis not present

## 2015-05-11 DIAGNOSIS — E1129 Type 2 diabetes mellitus with other diabetic kidney complication: Secondary | ICD-10-CM | POA: Diagnosis not present

## 2015-05-12 ENCOUNTER — Telehealth: Payer: Self-pay | Admitting: *Deleted

## 2015-05-12 DIAGNOSIS — E8770 Fluid overload, unspecified: Secondary | ICD-10-CM | POA: Diagnosis not present

## 2015-05-12 DIAGNOSIS — N186 End stage renal disease: Secondary | ICD-10-CM | POA: Diagnosis not present

## 2015-05-12 NOTE — Discharge Summary (Signed)
[facility] Discharge Summary  [name] 1960-11-29 55 y.o. female  [id number]  Admission Date: 05/05/2015  Discharge Date: 05/07/2015  Physician: Adele Barthel, MD  Admission Diagnosis: Nonhealing left heel wound L97.409  HPI:   This is a 55 y.o. female with an extensive nonhealing wound of her left heel. She previously underwent atherectomy with angioplasty of the left anterior tibial artery and left popliteal artery with drug coated balloon angioplasty to the left superficial femoral/popliteal artery by Dr. Trula Slade on 01/11/15. She had excellent results from this. She was last seen in the office on 02/16/15 where her left heel ulcer was debrided. She has no further revascularization options. The patient was offered a below-knee-amputation but she was very opposed to this. We also discussed debridement of the wound and placement of a negative pressure dressing which she was against. She's been having home health to do dressing changes and comes in for a 3 week follow up visit.  He denies fever or chills. She has some cramping in her leg but her pain is tolerable.   Hospital Course:  The patient was admitted to the hospital and taken to the operating room on 05/05/2015 and underwent: excisional debridement of skin and soft tissue and bone left heel with placement of VAC.    The patient tolerated the procedure well and was transported to the PACU in good condition.   Nephrology was consulted for dialysis management.   She was stable post-op. She was discharged on POD 2 in good condition with a home wound VAC. Home health was arranged for VAC dressing changes.     CBC    Component Value Date/Time   WBC 8.2 05/06/2015 0424   RBC 3.78* 05/06/2015 0424   HGB 11.0* 05/06/2015 0424   HCT 34.6* 05/06/2015 0424   PLT 286 05/06/2015 0424   MCV 91.5 05/06/2015 0424   MCH 29.1 05/06/2015 0424   MCHC 31.8 05/06/2015 0424   RDW 15.2 05/06/2015 0424   LYMPHSABS 1.5  03/25/2015 1125   MONOABS 0.8 03/25/2015 1125   EOSABS 0.1 03/25/2015 1125   BASOSABS 0.0 03/25/2015 1125    BMET    Component Value Date/Time   NA 138 05/06/2015 0424   K 5.3* 05/06/2015 0424   CL 97* 05/06/2015 0424   CO2 25 05/06/2015 0424   GLUCOSE 148* 05/06/2015 0424   BUN 41* 05/06/2015 0424   CREATININE 10.53* 05/06/2015 0424   CALCIUM 8.8* 05/06/2015 0424   GFRNONAA 4* 05/06/2015 0424   GFRAA 4* 05/06/2015 0424     Discharge Instructions:   The patient is discharged to home with extensive instructions on wound care and progressive ambulation.  They are instructed not to drive or perform any heavy lifting until returning to see the physician in his office.  Discharge Instructions    Call MD for:  redness, tenderness, or signs of infection (pain, swelling, bleeding, redness, odor or green/yellow discharge around incision site)    Complete by:  As directed      Call MD for:  severe or increased pain, loss or decreased feeling  in affected limb(s)    Complete by:  As directed      Call MD for:  temperature >100.5    Complete by:  As directed      Discharge patient    Complete by:  As directed   Discharge pt to home     Discharge wound care:    Complete by:  As directed   Negative  pressure wound therapy: Continuous suction 125 mmHg with dressing changes TTS.     Driving Restrictions    Complete by:  As directed   No driving for 2 weeks     Increase activity slowly    Complete by:  As directed   Walk with assistance use walker or cane as needed     Lifting restrictions    Complete by:  As directed   No lifting for 2 weeks     Resume previous diet    Complete by:  As directed            Discharge Diagnosis:  Nonhealing left heel wound L97.409  Secondary Diagnosis: Patient Active Problem List   Diagnosis Date Noted  . Nonhealing nonsurgical wound 05/05/2015  . Chronic anticoagulation - Coumadin, CHADS2VASC=5 04/21/2015  . PAF (paroxysmal atrial  fibrillation) ([place]) 04/21/2015  . Atrial fibrillation with RVR ([place]) 03/25/2015  . Atrial fibrillation with rapid ventricular response ([place]) 03/25/2015  . PAD (peripheral artery disease) ([place]) 01/11/2015  . Pleural effusion, right 08/16/2014  . Abdominal pain 08/16/2014  . Chest pain   . Cholecystitis   . Hypoxia   . 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 Ulcer of lower limb ([place]) 09/09/2013  . Pain in limb-left foot 11/19/2012  . End stage renal disease on dialysis ([place])   . DM (diabetes mellitus) type II controlled peripheral vascular disorder ([place])   . Peripheral vascular disease, unspecified ([place]) 07/02/2012  . Atherosclerosis of native arteries of the extremities with gangrene ([place]) 05/21/2012  . Hyperparathyroidism, secondary ([place]) 12/28/2010   Past Medical History  Diagnosis Date  . Hypertension   . CVA (cerebral infarction)     right parietal 05/19/2000  . Vaginal bleeding   . Hyperparathyroidism   . Pneumonia   . Stroke ([place])     2002,no residual  . Peripheral vascular disease ([place])   . GERD (gastroesophageal reflux disease)   . Anemia   . DM (diabetes mellitus) type II controlled peripheral vascular disorder ([place])   . Slip/trip w/o falling due to stepping on object, subs July  2014  . ESRD (end stage renal disease) ([place])     dialysis M/W/F  . Constipation   . Complication of anesthesia     Morphine makes her nauseated, states she has small veins       Medication List    TAKE these medications        acetaminophen 325 MG tablet  Commonly known as:  TYLENOL  Take 650 mg by mouth every Monday, Wednesday, and Friday. With dialysis     CALCIUM-VITAMIN D PO  Take 500 mg by mouth 3 (three) times daily with meals.     ciprofloxacin 500 MG tablet  Commonly known as:  CIPRO  Take 1 tablet (500 mg total) by mouth 2 (two) times daily.     diazepam 5 MG tablet  Commonly known as:  VALIUM  Take 5 mg by mouth every 8 (eight) hours as needed for muscle spasms.      diltiazem 120 MG 24 hr capsule  Commonly known as:  CARDIZEM CD  Take 1 capsule (120 mg total) by mouth daily.     diphenhydrAMINE 25 MG tablet  Commonly known as:  BENADRYL  Take 25 mg by mouth every 6 (six) hours as needed (Muscle spasm in feet).     glipiZIDE 10 MG tablet  Commonly known as:  GLUCOTROL  Take 0.5 tablets (5 mg total) by mouth daily.  multivitamin Tabs tablet  Take 1 tablet by mouth daily.     neomycin-bacitracin-polymyxin [phone] ointment  Apply 1 application topically daily. Apply to foot     PLAVIX PO  Take 75 mg by mouth daily.     polyethylene glycol packet  Commonly known as:  MIRALAX / GLYCOLAX  Take 17 g by mouth daily.     traMADol 50 MG tablet  Commonly known as:  ULTRAM  Take 1 tablet (50 mg total) by mouth every 6 (six) hours as needed for moderate pain (for foot pain). For foot pain     VITAMIN B-12 PO  Take 1 tablet by mouth 4 (four) times a week. Non dialysis days. Tues, Thurs, Sat and Sunday     vitamin C 500 MG tablet  Commonly known as:  ASCORBIC ACID  Take 500 mg by mouth daily.     vitamin E 400 UNIT capsule  Take 400 Units by mouth daily.     warfarin 5 MG tablet  Commonly known as:  COUMADIN  Take 1.5 tablets (7.5 mg total) by mouth daily at 6 PM.        Tramadol #30 No Refill  Disposition: Home  Patient's condition: is Good  Follow up: 1. Dr. [name] in 2 weeks   Virgina Jock, PA-C [facility] [phone] 05/12/2015  9:18 AM

## 2015-05-12 NOTE — Telephone Encounter (Signed)
Received call from patient's [facility] nurse Jackelyn Poling [phone]) regarding increased pain in left heel. [name] had complex left heel debridement and wound VAC placement by Dr. [name] on 05-05-15.  Of note, patient was started back on Coumadin 05-10-15 by Chari Manning, NP. Patient is a diabetic and her Nurse is working with her to get supplies and glucometer.  She is also a hemodialysis patient and Jackelyn Poling reports that she has been retaining a lot of fluid this week, needing additional dialysis treatments; regular schedule is M,W,F. Patient is reportedly taking 2 Tramadol 50 mg and 2 ES Tylenol every 6 hours but she is still having increased pain levels up to 10/10 in her left foot.She is afebrile and shows no S&S of infection per Jackelyn Poling.  I offered to bring her in to see our NP tomorrow but [name] nurse said patient had extra HD then and had to arrange SCAT 24 hours ahead of time. We made her an appt to see NP on Monday for evaluation and stronger pain meds if deemed necessary. I also instructed [facility] nurse to let patient know to contact our office if she has problems over the weekend with infection or pain.

## 2015-05-13 ENCOUNTER — Ambulatory Visit (INDEPENDENT_AMBULATORY_CARE_PROVIDER_SITE_OTHER): Payer: Medicare Other | Admitting: Pharmacist Clinician (PhC)/ Clinical Pharmacy Specialist

## 2015-05-13 ENCOUNTER — Encounter: Payer: Self-pay | Admitting: Family

## 2015-05-13 DIAGNOSIS — N186 End stage renal disease: Secondary | ICD-10-CM | POA: Diagnosis not present

## 2015-05-13 DIAGNOSIS — E1129 Type 2 diabetes mellitus with other diabetic kidney complication: Secondary | ICD-10-CM | POA: Diagnosis not present

## 2015-05-13 DIAGNOSIS — E1122 Type 2 diabetes mellitus with diabetic chronic kidney disease: Secondary | ICD-10-CM | POA: Diagnosis not present

## 2015-05-13 DIAGNOSIS — E1151 Type 2 diabetes mellitus with diabetic peripheral angiopathy without gangrene: Secondary | ICD-10-CM | POA: Diagnosis not present

## 2015-05-13 DIAGNOSIS — I4891 Unspecified atrial fibrillation: Secondary | ICD-10-CM | POA: Diagnosis not present

## 2015-05-13 DIAGNOSIS — N2581 Secondary hyperparathyroidism of renal origin: Secondary | ICD-10-CM | POA: Diagnosis not present

## 2015-05-13 DIAGNOSIS — D509 Iron deficiency anemia, unspecified: Secondary | ICD-10-CM | POA: Diagnosis not present

## 2015-05-13 DIAGNOSIS — E039 Hypothyroidism, unspecified: Secondary | ICD-10-CM | POA: Diagnosis not present

## 2015-05-13 DIAGNOSIS — I120 Hypertensive chronic kidney disease with stage 5 chronic kidney disease or end stage renal disease: Secondary | ICD-10-CM | POA: Diagnosis not present

## 2015-05-13 DIAGNOSIS — L97421 Non-pressure chronic ulcer of left heel and midfoot limited to breakdown of skin: Secondary | ICD-10-CM | POA: Diagnosis not present

## 2015-05-13 DIAGNOSIS — D631 Anemia in chronic kidney disease: Secondary | ICD-10-CM | POA: Diagnosis not present

## 2015-05-13 DIAGNOSIS — A4902 Methicillin resistant Staphylococcus aureus infection, unspecified site: Secondary | ICD-10-CM | POA: Diagnosis not present

## 2015-05-13 LAB — POCT INR: INR: 2.2

## 2015-05-15 DIAGNOSIS — I4891 Unspecified atrial fibrillation: Secondary | ICD-10-CM | POA: Diagnosis not present

## 2015-05-15 DIAGNOSIS — E1122 Type 2 diabetes mellitus with diabetic chronic kidney disease: Secondary | ICD-10-CM | POA: Diagnosis not present

## 2015-05-15 DIAGNOSIS — L97421 Non-pressure chronic ulcer of left heel and midfoot limited to breakdown of skin: Secondary | ICD-10-CM | POA: Diagnosis not present

## 2015-05-15 DIAGNOSIS — N186 End stage renal disease: Secondary | ICD-10-CM | POA: Diagnosis not present

## 2015-05-15 DIAGNOSIS — I120 Hypertensive chronic kidney disease with stage 5 chronic kidney disease or end stage renal disease: Secondary | ICD-10-CM | POA: Diagnosis not present

## 2015-05-15 DIAGNOSIS — E1151 Type 2 diabetes mellitus with diabetic peripheral angiopathy without gangrene: Secondary | ICD-10-CM | POA: Diagnosis not present

## 2015-05-16 ENCOUNTER — Ambulatory Visit (INDEPENDENT_AMBULATORY_CARE_PROVIDER_SITE_OTHER): Payer: Medicare Other | Admitting: Family

## 2015-05-16 ENCOUNTER — Encounter: Payer: Self-pay | Admitting: Family

## 2015-05-16 ENCOUNTER — Encounter: Payer: Self-pay | Admitting: Vascular Surgery

## 2015-05-16 VITALS — BP 114/73 | HR 106 | Temp 98.2°F | Resp 16 | Ht 62.0 in | Wt 150.0 lb

## 2015-05-16 DIAGNOSIS — S91302D Unspecified open wound, left foot, subsequent encounter: Secondary | ICD-10-CM

## 2015-05-16 DIAGNOSIS — A4902 Methicillin resistant Staphylococcus aureus infection, unspecified site: Secondary | ICD-10-CM | POA: Diagnosis not present

## 2015-05-16 DIAGNOSIS — N2581 Secondary hyperparathyroidism of renal origin: Secondary | ICD-10-CM | POA: Diagnosis not present

## 2015-05-16 DIAGNOSIS — N186 End stage renal disease: Secondary | ICD-10-CM

## 2015-05-16 DIAGNOSIS — D631 Anemia in chronic kidney disease: Secondary | ICD-10-CM | POA: Diagnosis not present

## 2015-05-16 DIAGNOSIS — D509 Iron deficiency anemia, unspecified: Secondary | ICD-10-CM | POA: Diagnosis not present

## 2015-05-16 DIAGNOSIS — I70262 Atherosclerosis of native arteries of extremities with gangrene, left leg: Secondary | ICD-10-CM

## 2015-05-16 DIAGNOSIS — Z992 Dependence on renal dialysis: Secondary | ICD-10-CM

## 2015-05-16 DIAGNOSIS — E1129 Type 2 diabetes mellitus with other diabetic kidney complication: Secondary | ICD-10-CM | POA: Diagnosis not present

## 2015-05-16 MED ORDER — TRAMADOL HCL 50 MG PO TABS: ORAL_TABLET | ORAL | Status: DC

## 2015-05-16 NOTE — Patient Instructions (Signed)
Peripheral Vascular Disease Peripheral vascular disease (PVD) is a disease of the blood vessels that are not part of your heart and brain. A simple term for PVD is poor circulation. In most cases, PVD narrows the blood vessels that carry blood from your heart to the rest of your body. This can result in a decreased supply of blood to your arms, legs, and internal organs, like your stomach or kidneys. However, it most often affects a person's lower legs and feet. There are two types of PVD.  Organic PVD. This is the more common type. It is caused by damage to the structure of blood vessels.  Functional PVD. This is caused by conditions that make blood vessels contract and tighten (spasm). Without treatment, PVD tends to get worse over time. PVD can also lead to acute ischemic limb. This is when an arm or limb suddenly has trouble getting enough blood. This is a medical emergency. CAUSES Each type of PVD has many different causes. The most common cause of PVD is buildup of a fatty material (plaque) inside of your arteries (atherosclerosis). Small amounts of plaque can break off from the walls of the blood vessels and become lodged in a smaller artery. This blocks blood flow and can cause acute ischemic limb. Other common causes of PVD include:  Blood clots that form inside of blood vessels.  Injuries to blood vessels.  Diseases that cause inflammation of blood vessels or cause blood vessel spasms.  Health behaviors and health history that increase your risk of developing PVD. RISK FACTORS  You may have a greater risk of PVD if you:  Have a family history of PVD.  Have certain medical conditions, including:  High cholesterol.  Diabetes.  High blood pressure (hypertension).  Coronary heart disease.  Past problems with blood clots.  Past injury, such as burns or a broken bone. These may have damaged blood vessels in your limbs.  Buerger disease. This is caused by inflamed blood  vessels in your hands and feet.  Some forms of arthritis.  Rare birth defects that affect the arteries in your legs.  Use tobacco.  Do not get enough exercise.  Are obese.  Are age 50 or older. SIGNS AND SYMPTOMS  PVD may cause many different symptoms. Your symptoms depend on what part of your body is not getting enough blood. Some common signs and symptoms include:  Cramps in your lower legs. This may be a symptom of poor leg circulation (claudication).  Pain and weakness in your legs while you are physically active that goes away when you rest (intermittent claudication).  Leg pain when at rest.  Leg numbness, tingling, or weakness.  Coldness in a leg or foot, especially when compared with the other leg.  Skin or hair changes. These can include:  Hair loss.  Shiny skin.  Pale or bluish skin.  Thick toenails.  Inability to get or maintain an erection (erectile dysfunction). People with PVD are more prone to developing ulcers and sores on their toes, feet, or legs. These may take longer than normal to heal. DIAGNOSIS Your health care provider may diagnose PVD from your signs and symptoms. The health care provider will also do a physical exam. You may have tests to find out what is causing your PVD and determine its severity. Tests may include:  Blood pressure recordings from your arms and legs and measurements of the strength of your pulses (pulse volume recordings).  Imaging studies using sound waves to take pictures of   the blood flow through your blood vessels (Doppler ultrasound).  Injecting a dye into your blood vessels before having imaging studies using:  X-rays (angiogram or arteriogram).  Computer-generated X-rays (CT angiogram).  A powerful electromagnetic field and a computer (magnetic resonance angiogram or MRA). TREATMENT Treatment for PVD depends on the cause of your condition and the severity of your symptoms. It also depends on your age. Underlying  causes need to be treated and controlled. These include long-lasting (chronic) conditions, such as diabetes, high cholesterol, and high blood pressure. You may need to first try making lifestyle changes and taking medicines. Surgery may be needed if these do not work. Lifestyle changes may include:  Quitting smoking.  Exercising regularly.  Following a low-fat, low-cholesterol diet. Medicines may include:  Blood thinners to prevent blood clots.  Medicines to improve blood flow.  Medicines to improve your blood cholesterol levels. Surgical procedures may include:  A procedure that uses an inflated balloon to open a blocked artery and improve blood flow (angioplasty).  A procedure to put in a tube (stent) to keep a blocked artery open (stent implant).  Surgery to reroute blood flow around a blocked artery (peripheral bypass surgery).  Surgery to remove dead tissue from an infected wound on the affected limb.  Amputation. This is surgical removal of the affected limb. This may be necessary in cases of acute ischemic limb that are not improved through medical or surgical treatments. HOME CARE INSTRUCTIONS  Take medicines only as directed by your health care provider.  Do not use any tobacco products, including cigarettes, chewing tobacco, or electronic cigarettes. If you need help quitting, ask your health care provider.  Lose weight if you are overweight, and maintain a healthy weight as directed by your health care provider.  Eat a diet that is low in fat and cholesterol. If you need help, ask your health care provider.  Exercise regularly. Ask your health care provider to suggest some good activities for you.  Use compression stockings or other mechanical devices as directed by your health care provider.  Take good care of your feet.  Wear comfortable shoes that fit well.  Check your feet often for any cuts or sores. SEEK MEDICAL CARE IF:  You have cramps in your legs  while walking.  You have leg pain when you are at rest.  You have coldness in a leg or foot.  Your skin changes.  You have erectile dysfunction.  You have cuts or sores on your feet that are not healing. SEEK IMMEDIATE MEDICAL CARE IF:  Your arm or leg turns cold and blue.  Your arms or legs become red, warm, swollen, painful, or numb.  You have chest pain or trouble breathing.  You suddenly have weakness in your face, arm, or leg.  You become very confused or lose the ability to speak.  You suddenly have a very bad headache or lose your vision.   This information is not intended to replace advice given to you by your health care provider. Make sure you discuss any questions you have with your health care provider.   Document Released: 05/17/2004 Document Revised: 04/30/2014 Document Reviewed: 09/17/2013 Elsevier Interactive Patient Education 2016 [employer]  

## 2015-05-16 NOTE — Progress Notes (Signed)
[facility] [place] HISTORY AND PHYSICAL -PAD  History of Present Illness [name] is a 55 y.o. female patient of Dr. [name] with an extensive nonhealing wound of her left heel. She previously underwent atherectomy with angioplasty of the left anterior tibial artery and left popliteal artery with drug coated balloon angioplasty to the left superficial femoral/popliteal artery by Dr. Trula Slade on 01/11/15. She had excellent results from this. She was seen in the office on 02/16/15 where her left heel ulcer was debrided. She has no further revascularization options. The patient was offered a below-knee-amputation but she was very opposed to this. She's been having home health to do dressing changes.  She is s/p excisional debridement of skin and soft tissue and bone of left heel with placement of VAC on 05/05/15.  She returns today at the request of [facility] nurse for increased pain in her left heel and wound check after falling on her left heel recently.  She wants [place] to check her left heel for evidence of injury or worsening circulation to left foot.  She continues to take tramadol for pain, but states her pharmacy changed generics and the current tramadol is not helping the pain in her left heel/foot as did the previous tramadol.   She is on Coumadin for atrial fibrillation. She dialyzes on Monday Wednesdays and Fridays.   Pt Diabetic: Yes Pt smoker: non-smoker  Pt meds include: Statin :No Betablocker: No ASA: No Other anticoagulants/antiplatelets: Plavix, Coumadin  Past Medical History  Diagnosis Date  . Hypertension   . CVA (cerebral infarction)     right parietal 05/19/2000  . Vaginal bleeding   . Hyperparathyroidism   . Pneumonia   . Stroke ([place])     2002,no residual  . Peripheral vascular disease ([place])   . GERD (gastroesophageal reflux disease)   . Anemia   . DM (diabetes mellitus) type II controlled peripheral vascular disorder ([place])    . Slip/trip w/o falling due to stepping on object, subs July  2014  . ESRD (end stage renal disease) ([place])     dialysis M/W/F  . Constipation   . Complication of anesthesia     Morphine makes her nauseated, states she has small veins    Social History Social History  Substance Use Topics  . Smoking status: Never Smoker   . Smokeless tobacco: Never Used  . Alcohol Use: No    Family History Family History  Problem Relation Age of Onset  . Hypertension Father   . Kidney disease Mother     Past Surgical History  Procedure Laterality Date  . Parathyroidectomy with autotransplant  12/07/2010  . Knee surgery Right X 2  . Brachial artery graft      x2 for dialysis  . Av fistula placement Right     upper thigh  . Dental extractions Bilateral   . Dilation and curettage of uterus  1986  . 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 Leg surgery Right   . Foot surgery Left     5 TOES AMPUTATED  . Abdominal aortagram N/A 05/27/2012    Procedure: ABDOMINAL Maxcine Ham;  Surgeon: Serafina Mitchell, MD;  Location: [facility] CATH LAB;  Service: Cardiovascular;  Laterality: N/A;  . Lower extremity angiogram Left 05/27/2012    Procedure: LOWER EXTREMITY ANGIOGRAM;  Surgeon: Serafina Mitchell, MD;  Location: [facility] CATH  LAB;  Service: Cardiovascular;  Laterality: Left;  lt leg angio  . Tooth extraction  Sep 08, 2014  . Peripheral vascular catheterization N/A 01/11/2015    Procedure: Lower Extremity Angiography;  Surgeon: Serafina Mitchell, MD;  Location: [place] CV LAB;  Service: Cardiovascular;  Laterality: N/A;  . I&d extremity Left 05/05/2015    Procedure: DEBRIDEMENT HEEL WOUND;  Surgeon: Angelia Mould, MD;  Location: [place];  Service: Vascular;  Laterality: Left;  . Application of wound vac Left 05/05/2015    Procedure: APPLICATION OF WOUND VAC;  Surgeon:  Angelia Mould, MD;  Location: [place];  Service: Vascular;  Laterality: Left;    Allergies  Allergen Reactions  . Doxycycline Itching  . Peroxyl [Hydrogen Peroxide] Hives and Rash    Current Outpatient Prescriptions  Medication Sig Dispense Refill  . acetaminophen (TYLENOL) 325 MG tablet Take 650 mg by mouth every Monday, Wednesday, and Friday. With dialysis    . CALCIUM-VITAMIN D PO Take 500 mg by mouth 3 (three) times daily with meals.     . ciprofloxacin (CIPRO) 500 MG tablet Take 1 tablet (500 mg total) by mouth 2 (two) times daily. 28 tablet 0  . Clopidogrel Bisulfate (PLAVIX PO) Take 75 mg by mouth daily.     . Cyanocobalamin (VITAMIN B-12 PO) Take 1 tablet by mouth 4 (four) times a week. Non dialysis days. Tues, Thurs, Sat and Sunday    . diazepam (VALIUM) 5 MG tablet Take 5 mg by mouth every 8 (eight) hours as needed for muscle spasms.   0  . diltiazem (CARDIZEM CD) 120 MG 24 hr capsule Take 1 capsule (120 mg total) by mouth daily. 60 capsule 0  . diphenhydrAMINE (BENADRYL) 25 MG tablet Take 25 mg by mouth every 6 (six) hours as needed (Muscle spasm in feet).    Marland Kitchen glipiZIDE (GLUCOTROL) 10 MG tablet Take 0.5 tablets (5 mg total) by mouth daily. 45 tablet 3  . multivitamin (RENA-VIT) TABS tablet Take 1 tablet by mouth daily.    Marland Kitchen neomycin-bacitracin-polymyxin (NEOSPORIN) [phone] ointment Apply 1 application topically daily. Apply to foot    . polyethylene glycol (MIRALAX / GLYCOLAX) packet Take 17 g by mouth daily. (Patient taking differently: Take 17 g by mouth daily as needed for mild constipation. ) 14 each 0  . traMADol (ULTRAM) 50 MG tablet Take 1 tablet (50 mg total) by mouth every 6 (six) hours as needed for moderate pain (for foot pain). For foot pain 30 tablet 0  . vitamin C (ASCORBIC ACID) 500 MG tablet Take 500 mg by mouth daily.    . vitamin E 400 UNIT capsule Take 400 Units by mouth daily.    Marland Kitchen warfarin (COUMADIN) 5 MG tablet Take 1.5 tablets (7.5 mg total) by  mouth daily at 6 PM. 60 tablet 0   No current facility-administered medications for this visit.    ROS: See HPI for pertinent positives and negatives.   Physical Examination  Filed Vitals:   05/16/15 1419  BP: 114/73  Pulse: 106  Temp: 98.2 F (36.8 C)  TempSrc: Oral  Resp: 16  Height: 5\' 2"  (1.575 m)  Weight: 150 lb (68.04 kg)  SpO2: 100%   Body mass index is 27.43 kg/(m^2).  General: A&O x 3, WDWN, chronically ill appearing. Gait: slow, deliberate, using rolling walker Eyes: PERRLA. Pulmonary: Respirations are non labored Cardiac: regular rythm  VASCULAR EXAM: Extremities with ischemic changes: 4.0 cm x 4.5 cm shallow debrided ulcer, shaved bone and pink tissue exposed, medial aspect left heel. Wound vac in place. All left toes are surgically absent.   Hemodialysis AV graft present in right upper thigh with palpable thrill.                                                                                                           LE Pulses Right Left       FEMORAL  monophasic by Doppler and not palpable  non-Dopplerable and not palpable        POPLITEAL  not palpable   not palpable       POSTERIOR TIBIAL  monophasic by Doppler and not palpable   non-Dopplerable and not palpable        DORSALIS PEDIS      ANTERIOR TIBIAL biphasic by Doppler and not palpable  monophasic by Doppler and not palpable    Abdomen: soft, NT, no palpable masses. Skin: no rashes, see extremities Musculoskeletal: no muscle wasting or atrophy.  Neurologic: A&O X 3; Appropriate Affect ; SENSATION: normal; MOTOR FUNCTION:  moving all extremities equally, motor strength 5/5 throughout. Speech is fluent/normal. CN 2-12 intact.   ASSESSMENT: [name] is a 55 y.o. female with an extensive nonhealing wound of her left heel. She previously underwent atherectomy with angioplasty of the left anterior tibial artery and left popliteal artery with drug coated balloon  angioplasty to the left superficial femoral/popliteal artery by Dr. Trula Slade on 01/11/15. She had excellent results from this. She was seen in the office on 02/16/15 where her left heel ulcer was debrided. She has no further revascularization options. The patient was offered a below-knee-amputation but she was very opposed to this. She's been having home health to do dressing changes.  She is s/p excisional debridement of skin and soft tissue and bone of left heel with placement of VAC on 05/05/15.  She returns today at the request of [facility] nurse for increased pain in her left heel and wound check after falling on her left heel recently.  She wants [place] to check her left heel for evidence of injury or worsening circulation to left foot.  She continues to take tramadol for pain, but states her pharmacy changed generics and the current tramadol is not helping the pain in her left heel/foot as did the previous tramadol.    Wound vac removed. Medial aspect left heel with 4 cm x 4.5 cm shallow debrided ulcer, same size that Dr. [name] measured at pt's 04/13/15 visit.     PLAN:  Continue Cipro. NS wet to dry dressings to left heel ulcer twice daily; pt states she is able to do this when [facility] is not present.  Advised pt that she may take 2 tramadol to address her left heel pain if one is not sufficient, tramadol script given.  Based on the patient's vascular studies and examination, and after discussing with Dr. Trula Slade, pt will return to clinic in 1-2 weeks, see Dr.  [name] for evaluation of left heel ulcer.   The patient was given information about PAD including signs, symptoms, treatment, what symptoms should prompt the patient to seek immediate medical care, and risk reduction measures to take.  Clemon Chambers, RN, MSN, FNP-C [facility] Phone: [phone]  Clinic MD: Trula Slade  05/16/2015 2:15 PM

## 2015-05-17 DIAGNOSIS — N186 End stage renal disease: Secondary | ICD-10-CM | POA: Diagnosis not present

## 2015-05-17 DIAGNOSIS — I120 Hypertensive chronic kidney disease with stage 5 chronic kidney disease or end stage renal disease: Secondary | ICD-10-CM | POA: Diagnosis not present

## 2015-05-17 DIAGNOSIS — E1151 Type 2 diabetes mellitus with diabetic peripheral angiopathy without gangrene: Secondary | ICD-10-CM | POA: Diagnosis not present

## 2015-05-17 DIAGNOSIS — E1122 Type 2 diabetes mellitus with diabetic chronic kidney disease: Secondary | ICD-10-CM | POA: Diagnosis not present

## 2015-05-17 DIAGNOSIS — L97421 Non-pressure chronic ulcer of left heel and midfoot limited to breakdown of skin: Secondary | ICD-10-CM | POA: Diagnosis not present

## 2015-05-17 DIAGNOSIS — I4891 Unspecified atrial fibrillation: Secondary | ICD-10-CM | POA: Diagnosis not present

## 2015-05-18 DIAGNOSIS — N186 End stage renal disease: Secondary | ICD-10-CM | POA: Diagnosis not present

## 2015-05-18 DIAGNOSIS — N2581 Secondary hyperparathyroidism of renal origin: Secondary | ICD-10-CM | POA: Diagnosis not present

## 2015-05-18 DIAGNOSIS — I482 Chronic atrial fibrillation: Secondary | ICD-10-CM | POA: Diagnosis not present

## 2015-05-18 DIAGNOSIS — E1129 Type 2 diabetes mellitus with other diabetic kidney complication: Secondary | ICD-10-CM | POA: Diagnosis not present

## 2015-05-18 DIAGNOSIS — D631 Anemia in chronic kidney disease: Secondary | ICD-10-CM | POA: Diagnosis not present

## 2015-05-18 DIAGNOSIS — D509 Iron deficiency anemia, unspecified: Secondary | ICD-10-CM | POA: Diagnosis not present

## 2015-05-18 DIAGNOSIS — A4902 Methicillin resistant Staphylococcus aureus infection, unspecified site: Secondary | ICD-10-CM | POA: Diagnosis not present

## 2015-05-19 ENCOUNTER — Telehealth: Payer: Self-pay | Admitting: Cardiology

## 2015-05-19 ENCOUNTER — Ambulatory Visit (INDEPENDENT_AMBULATORY_CARE_PROVIDER_SITE_OTHER): Payer: Medicare Other | Admitting: Pharmacist Clinician (PhC)/ Clinical Pharmacy Specialist

## 2015-05-19 DIAGNOSIS — I120 Hypertensive chronic kidney disease with stage 5 chronic kidney disease or end stage renal disease: Secondary | ICD-10-CM | POA: Diagnosis not present

## 2015-05-19 DIAGNOSIS — E1122 Type 2 diabetes mellitus with diabetic chronic kidney disease: Secondary | ICD-10-CM | POA: Diagnosis not present

## 2015-05-19 DIAGNOSIS — N186 End stage renal disease: Secondary | ICD-10-CM | POA: Diagnosis not present

## 2015-05-19 DIAGNOSIS — E1151 Type 2 diabetes mellitus with diabetic peripheral angiopathy without gangrene: Secondary | ICD-10-CM | POA: Diagnosis not present

## 2015-05-19 DIAGNOSIS — L97421 Non-pressure chronic ulcer of left heel and midfoot limited to breakdown of skin: Secondary | ICD-10-CM | POA: Diagnosis not present

## 2015-05-19 DIAGNOSIS — I4891 Unspecified atrial fibrillation: Secondary | ICD-10-CM | POA: Diagnosis not present

## 2015-05-19 LAB — POCT INR: INR: 4.8

## 2015-05-19 NOTE — Telephone Encounter (Signed)
See anticoag note

## 2015-05-19 NOTE — Telephone Encounter (Signed)
error 

## 2015-05-19 NOTE — Telephone Encounter (Signed)
[facility]  [facility]/critical labs .   Please call

## 2015-05-19 NOTE — Telephone Encounter (Signed)
[name] from [facility] calling to report a crit INR from yesterday.  Notes INR of 4.36  Routed to [place] to address.

## 2015-05-20 DIAGNOSIS — E1129 Type 2 diabetes mellitus with other diabetic kidney complication: Secondary | ICD-10-CM | POA: Diagnosis not present

## 2015-05-20 DIAGNOSIS — D509 Iron deficiency anemia, unspecified: Secondary | ICD-10-CM | POA: Diagnosis not present

## 2015-05-20 DIAGNOSIS — A4902 Methicillin resistant Staphylococcus aureus infection, unspecified site: Secondary | ICD-10-CM | POA: Diagnosis not present

## 2015-05-20 DIAGNOSIS — N186 End stage renal disease: Secondary | ICD-10-CM | POA: Diagnosis not present

## 2015-05-20 DIAGNOSIS — N2581 Secondary hyperparathyroidism of renal origin: Secondary | ICD-10-CM | POA: Diagnosis not present

## 2015-05-20 DIAGNOSIS — D631 Anemia in chronic kidney disease: Secondary | ICD-10-CM | POA: Diagnosis not present

## 2015-05-23 DIAGNOSIS — A4902 Methicillin resistant Staphylococcus aureus infection, unspecified site: Secondary | ICD-10-CM | POA: Diagnosis not present

## 2015-05-23 DIAGNOSIS — N186 End stage renal disease: Secondary | ICD-10-CM | POA: Diagnosis not present

## 2015-05-23 DIAGNOSIS — N2581 Secondary hyperparathyroidism of renal origin: Secondary | ICD-10-CM | POA: Diagnosis not present

## 2015-05-23 DIAGNOSIS — D631 Anemia in chronic kidney disease: Secondary | ICD-10-CM | POA: Diagnosis not present

## 2015-05-23 DIAGNOSIS — E1129 Type 2 diabetes mellitus with other diabetic kidney complication: Secondary | ICD-10-CM | POA: Diagnosis not present

## 2015-05-23 DIAGNOSIS — D509 Iron deficiency anemia, unspecified: Secondary | ICD-10-CM | POA: Diagnosis not present

## 2015-05-24 DIAGNOSIS — E1122 Type 2 diabetes mellitus with diabetic chronic kidney disease: Secondary | ICD-10-CM | POA: Diagnosis not present

## 2015-05-24 DIAGNOSIS — E1151 Type 2 diabetes mellitus with diabetic peripheral angiopathy without gangrene: Secondary | ICD-10-CM | POA: Diagnosis not present

## 2015-05-24 DIAGNOSIS — N186 End stage renal disease: Secondary | ICD-10-CM | POA: Diagnosis not present

## 2015-05-24 DIAGNOSIS — L97421 Non-pressure chronic ulcer of left heel and midfoot limited to breakdown of skin: Secondary | ICD-10-CM | POA: Diagnosis not present

## 2015-05-24 DIAGNOSIS — E1129 Type 2 diabetes mellitus with other diabetic kidney complication: Secondary | ICD-10-CM | POA: Diagnosis not present

## 2015-05-24 DIAGNOSIS — I4891 Unspecified atrial fibrillation: Secondary | ICD-10-CM | POA: Diagnosis not present

## 2015-05-24 DIAGNOSIS — Z992 Dependence on renal dialysis: Secondary | ICD-10-CM | POA: Diagnosis not present

## 2015-05-24 DIAGNOSIS — I120 Hypertensive chronic kidney disease with stage 5 chronic kidney disease or end stage renal disease: Secondary | ICD-10-CM | POA: Diagnosis not present

## 2015-05-25 ENCOUNTER — Encounter: Payer: Self-pay | Admitting: Vascular Surgery

## 2015-05-25 ENCOUNTER — Ambulatory Visit (INDEPENDENT_AMBULATORY_CARE_PROVIDER_SITE_OTHER): Payer: Medicare Other | Admitting: Vascular Surgery

## 2015-05-25 VITALS — BP 107/65 | HR 87 | Temp 99.5°F | Resp 16 | Ht 64.0 in | Wt 150.0 lb

## 2015-05-25 DIAGNOSIS — N186 End stage renal disease: Secondary | ICD-10-CM | POA: Diagnosis not present

## 2015-05-25 DIAGNOSIS — I70262 Atherosclerosis of native arteries of extremities with gangrene, left leg: Secondary | ICD-10-CM

## 2015-05-25 DIAGNOSIS — I7025 Atherosclerosis of native arteries of other extremities with ulceration: Secondary | ICD-10-CM

## 2015-05-25 DIAGNOSIS — A4902 Methicillin resistant Staphylococcus aureus infection, unspecified site: Secondary | ICD-10-CM | POA: Diagnosis not present

## 2015-05-25 DIAGNOSIS — D631 Anemia in chronic kidney disease: Secondary | ICD-10-CM | POA: Diagnosis not present

## 2015-05-25 DIAGNOSIS — E1129 Type 2 diabetes mellitus with other diabetic kidney complication: Secondary | ICD-10-CM | POA: Diagnosis not present

## 2015-05-25 NOTE — Progress Notes (Signed)
Patient name: [name] MRN: [id number] DOB: 1960-09-01 Sex: female  REASON FOR VISIT: Follow up after excisional debridement of left heel wound and placement of VAC.  HPI: [name] is a 55 y.o. female who had presented with a nonhealing wound of her left heel. She had undergone a tibial angioplasty with reasonable blood flow to the left foot but had an extensive wound on the heel with exposed bone. I felt that she would best be served with primary below-the-knee amputation however she was not willing to consider this and therefore we agreed to debride the wound and place a VAC. This was done on 05/05/2015. She was discharged on postoperative day #1 on po Cipro.  She was seen by our nurse practitioner last week and the [facility] was removed. She comes in for follow up visit.  She complains of fatigue and she just got off dialysis.  A wound on her left heel is not painful. She denies fever or chills.  Current Outpatient Prescriptions  Medication Sig Dispense Refill  . acetaminophen (TYLENOL) 325 MG tablet Take 650 mg by mouth every Monday, Wednesday, and Friday. With dialysis    . CALCIUM-VITAMIN D PO Take 500 mg by mouth 3 (three) times daily with meals.     . ciprofloxacin (CIPRO) 500 MG tablet Take 1 tablet (500 mg total) by mouth 2 (two) times daily. 28 tablet 0  . Clopidogrel Bisulfate (PLAVIX PO) Take 75 mg by mouth daily.     . Cyanocobalamin (VITAMIN B-12 PO) Take 1 tablet by mouth 4 (four) times a week. Non dialysis days. Tues, Thurs, Sat and Sunday    . diazepam (VALIUM) 5 MG tablet Take 5 mg by mouth every 8 (eight) hours as needed for muscle spasms.   0  . diltiazem (CARDIZEM CD) 120 MG 24 hr capsule Take 1 capsule (120 mg total) by mouth daily. 60 capsule 0  . diphenhydrAMINE (BENADRYL) 25 MG tablet Take 25 mg by mouth every 6 (six) hours as needed (Muscle spasm in feet).    Marland Kitchen glipiZIDE (GLUCOTROL) 10 MG tablet Take 0.5 tablets (5 mg total) by mouth daily. 45 tablet 3  .  multivitamin (RENA-VIT) TABS tablet Take 1 tablet by mouth daily.    Marland Kitchen neomycin-bacitracin-polymyxin (NEOSPORIN) [phone] ointment Apply 1 application topically daily. Apply to foot    . polyethylene glycol (MIRALAX / GLYCOLAX) packet Take 17 g by mouth daily. (Patient taking differently: Take 17 g by mouth daily as needed for mild constipation. ) 14 each 0  . traMADol (ULTRAM) 50 MG tablet Take 1-2 tablets by mouth every 6 hours as needed for foot pain 50 tablet 0  . vitamin C (ASCORBIC ACID) 500 MG tablet Take 500 mg by mouth daily.    . vitamin E 400 UNIT capsule Take 400 Units by mouth daily.    Marland Kitchen warfarin (COUMADIN) 5 MG tablet Take 1.5 tablets (7.5 mg total) by mouth daily at 6 PM. 60 tablet 0   No current facility-administered medications for this visit.    REVIEW OF SYSTEMS:  [X]  denotes positive finding, [ ]  denotes negative finding Cardiac  Comments:  Chest pain or chest pressure:    Shortness of breath upon exertion:    Short of breath when lying flat:    Irregular heart rhythm:    Constitutional    Fever or chills:      PHYSICAL EXAM: Filed Vitals:   05/25/15 1523  BP: 107/65  Pulse: 87  Temp: 99.5 F (37.5  C)  TempSrc: Oral  Resp: 16  Height: 5\' 4"  (1.626 m)  Weight: 150 lb (68.04 kg)  SpO2: 95%    GENERAL: The patient is a well-nourished female, in no acute distress. The vital signs are documented above. CARDIOVASCULAR: There is a regular rate and rhythm. PULMONARY: There is good air exchange bilaterally without wheezing or rales. The wound on the left heel measures 4 cm in diameter and the edges of the wound are dry. There is exposed bone.  I have reviewed her previous arteriogram from September 2016. 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 Her only runoff was the anterior tibial artery and she underwent angioplasty and stenting of this by Dr. Trula Slade.   MEDICAL ISSUES:  NONHEALING WOUND OF THE LEFT HEEL: I do not  think that the wound on the left heel is improving and I have again recommended left below-the-knee amputation. She is still not agreeable. I'll plan on seeing her back in 3-4 weeks. If the wound progresses hopefully she will be agreeable to proceed with a left below-the-knee amputation. There are no further options for revascularization. Given the extent of the wound, even if there were, I do not think that the foot is salvageable.  Deitra Mayo [facility] [place]: [phone]

## 2015-05-26 ENCOUNTER — Ambulatory Visit (INDEPENDENT_AMBULATORY_CARE_PROVIDER_SITE_OTHER): Payer: Medicare Other | Admitting: Pharmacist Clinician (PhC)/ Clinical Pharmacy Specialist

## 2015-05-26 DIAGNOSIS — E1122 Type 2 diabetes mellitus with diabetic chronic kidney disease: Secondary | ICD-10-CM | POA: Diagnosis not present

## 2015-05-26 DIAGNOSIS — E1151 Type 2 diabetes mellitus with diabetic peripheral angiopathy without gangrene: Secondary | ICD-10-CM | POA: Diagnosis not present

## 2015-05-26 DIAGNOSIS — I4891 Unspecified atrial fibrillation: Secondary | ICD-10-CM | POA: Diagnosis not present

## 2015-05-26 DIAGNOSIS — N186 End stage renal disease: Secondary | ICD-10-CM | POA: Diagnosis not present

## 2015-05-26 DIAGNOSIS — I120 Hypertensive chronic kidney disease with stage 5 chronic kidney disease or end stage renal disease: Secondary | ICD-10-CM | POA: Diagnosis not present

## 2015-05-26 DIAGNOSIS — L97421 Non-pressure chronic ulcer of left heel and midfoot limited to breakdown of skin: Secondary | ICD-10-CM | POA: Diagnosis not present

## 2015-05-26 LAB — POCT INR: INR: 1.9

## 2015-05-27 DIAGNOSIS — D631 Anemia in chronic kidney disease: Secondary | ICD-10-CM | POA: Diagnosis not present

## 2015-05-27 DIAGNOSIS — N186 End stage renal disease: Secondary | ICD-10-CM | POA: Diagnosis not present

## 2015-05-27 DIAGNOSIS — E1129 Type 2 diabetes mellitus with other diabetic kidney complication: Secondary | ICD-10-CM | POA: Diagnosis not present

## 2015-05-29 ENCOUNTER — Encounter (HOSPITAL_COMMUNITY): Payer: Self-pay | Admitting: Emergency Medicine

## 2015-05-29 ENCOUNTER — Emergency Department (HOSPITAL_COMMUNITY): Payer: Medicare Other

## 2015-05-29 ENCOUNTER — Emergency Department (HOSPITAL_COMMUNITY)
Admission: EM | Admit: 2015-05-29 | Discharge: 2015-05-29 | Disposition: A | Discharge status: Home / Self Care | Payer: Medicare Other | Attending: Physician Assistant | Admitting: Physician Assistant

## 2015-05-29 DIAGNOSIS — Z87828 Personal history of other (healed) physical injury and trauma: Secondary | ICD-10-CM | POA: Diagnosis not present

## 2015-05-29 DIAGNOSIS — Z79899 Other long term (current) drug therapy: Secondary | ICD-10-CM | POA: Diagnosis not present

## 2015-05-29 DIAGNOSIS — M79609 Pain in unspecified limb: Secondary | ICD-10-CM | POA: Diagnosis not present

## 2015-05-29 DIAGNOSIS — Z7901 Long term (current) use of anticoagulants: Secondary | ICD-10-CM | POA: Insufficient documentation

## 2015-05-29 DIAGNOSIS — M7989 Other specified soft tissue disorders: Secondary | ICD-10-CM | POA: Diagnosis not present

## 2015-05-29 DIAGNOSIS — E1151 Type 2 diabetes mellitus with diabetic peripheral angiopathy without gangrene: Secondary | ICD-10-CM | POA: Diagnosis not present

## 2015-05-29 DIAGNOSIS — Z8701 Personal history of pneumonia (recurrent): Secondary | ICD-10-CM | POA: Diagnosis not present

## 2015-05-29 DIAGNOSIS — Z7902 Long term (current) use of antithrombotics/antiplatelets: Secondary | ICD-10-CM | POA: Insufficient documentation

## 2015-05-29 DIAGNOSIS — N186 End stage renal disease: Secondary | ICD-10-CM | POA: Insufficient documentation

## 2015-05-29 DIAGNOSIS — I120 Hypertensive chronic kidney disease with stage 5 chronic kidney disease or end stage renal disease: Secondary | ICD-10-CM | POA: Diagnosis not present

## 2015-05-29 DIAGNOSIS — Z9889 Other specified postprocedural states: Secondary | ICD-10-CM | POA: Insufficient documentation

## 2015-05-29 DIAGNOSIS — Z792 Long term (current) use of antibiotics: Secondary | ICD-10-CM | POA: Diagnosis not present

## 2015-05-29 DIAGNOSIS — Z8673 Personal history of transient ischemic attack (TIA), and cerebral infarction without residual deficits: Secondary | ICD-10-CM | POA: Diagnosis not present

## 2015-05-29 DIAGNOSIS — K5900 Constipation, unspecified: Secondary | ICD-10-CM | POA: Diagnosis not present

## 2015-05-29 DIAGNOSIS — T148XXA Other injury of unspecified body region, initial encounter: Secondary | ICD-10-CM

## 2015-05-29 DIAGNOSIS — Y658 Other specified misadventures during surgical and medical care: Secondary | ICD-10-CM | POA: Insufficient documentation

## 2015-05-29 DIAGNOSIS — Z7984 Long term (current) use of oral hypoglycemic drugs: Secondary | ICD-10-CM | POA: Diagnosis not present

## 2015-05-29 DIAGNOSIS — D649 Anemia, unspecified: Secondary | ICD-10-CM | POA: Diagnosis not present

## 2015-05-29 DIAGNOSIS — T8189XA Other complications of procedures, not elsewhere classified, initial encounter: Secondary | ICD-10-CM | POA: Diagnosis not present

## 2015-05-29 DIAGNOSIS — M9689 Other intraoperative and postprocedural complications and disorders of the musculoskeletal system: Secondary | ICD-10-CM | POA: Diagnosis not present

## 2015-05-29 DIAGNOSIS — Z992 Dependence on renal dialysis: Secondary | ICD-10-CM | POA: Insufficient documentation

## 2015-05-29 DIAGNOSIS — M79672 Pain in left foot: Secondary | ICD-10-CM | POA: Diagnosis present

## 2015-05-29 MED ORDER — OXYCODONE-ACETAMINOPHEN 5-325 MG PO TABS
1.0000 | ORAL_TABLET | Freq: Once | ORAL | Status: AC
  Administered 2015-05-29: 1 via ORAL
  Filled 2015-05-29: qty 1

## 2015-05-29 MED ORDER — OXYCODONE-ACETAMINOPHEN 5-325 MG PO TABS: 1.0000 | ORAL_TABLET | Freq: Once | ORAL | Status: DC

## 2015-05-29 MED ORDER — CIPROFLOXACIN HCL 500 MG PO TABS
500.0000 mg | ORAL_TABLET | Freq: Once | ORAL | Status: AC
  Administered 2015-05-29: 500 mg via ORAL
  Filled 2015-05-29: qty 1

## 2015-05-29 NOTE — ED Notes (Signed)
Per PT[facility], pt from home with c/o left foot pain. 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 BP-130/70, HR-100, 100%RA, CBG-127

## 2015-05-29 NOTE — Discharge Instructions (Signed)
Return with fever or other symtpoms. Please follow up with PCP.

## 2015-05-29 NOTE — ED Notes (Signed)
Pts belongings in seat of rollling/sitting walker put in pt belonging bag and given to PTAR. PTAR has pts rolling/sitting walker with them.

## 2015-05-29 NOTE — ED Notes (Signed)
PTAR arrived.  

## 2015-05-29 NOTE — ED Notes (Signed)
PT[facility] called by BP

## 2015-05-29 NOTE — ED Notes (Signed)
Pt unable to find anyone to transport pt back home. [facility] called.

## 2015-05-29 NOTE — ED Provider Notes (Signed)
CSN: [id number]     Arrival date & time 05/29/15  1911 History   First MD Initiated Contact with Patient 05/29/15 1923     Chief Complaint  Patient presents with  . Foot Pain     (Consider location/radiation/quality/duration/timing/severity/associated sxs/prior Treatment) HPI   Patient is a 55 year old female with history of stroke, diabetes, end-stage renal disease on dialysis with nonhealing wound status post amputation to the left foot. Patient being seen by the wound center they recommended a BKA. However patient refusing to do so at this time. Patient on Cipro twice a day since most recent debridement in late January. Patient here today because she was in normal state of health but she bumped her left amputated half foot onto a piece of wood and sustained some pain. She came today to get an x-ray because she is concerned that she might have fractured something.  Patient reports no fever. Has been eating and drinking normally.  Patient said she might of missed a couple of doses of her Cipro recently. She has not taking any at today.  Past Medical History  Diagnosis Date  . Hypertension   . CVA (cerebral infarction)     right parietal 05/19/2000  . Vaginal bleeding   . Hyperparathyroidism   . Pneumonia   . Stroke ([place])     2002,no residual  . Peripheral vascular disease ([place])   . GERD (gastroesophageal reflux disease)   . Anemia   . DM (diabetes mellitus) type II controlled peripheral vascular disorder ([place])   . Slip/trip w/o falling due to stepping on object, subs July  2014  . ESRD (end stage renal disease) ([place])     dialysis M/W/F  . Constipation   . Complication of anesthesia     Morphine makes her nauseated, states she has small veins   Past Surgical History  Procedure Laterality Date  . Parathyroidectomy with autotransplant  12/07/2010  . Knee surgery Right X 2  . Brachial artery graft      x2 for dialysis  . Av fistula placement Right     upper thigh  . Dental  extractions Bilateral   . Dilation and curettage of uterus  1986  . 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 Leg surgery Right   . Foot surgery Left     5 TOES AMPUTATED  . Abdominal aortagram N/A 05/27/2012    Procedure: ABDOMINAL Maxcine Ham;  Surgeon: Serafina Mitchell, MD;  Location: [facility] CATH LAB;  Service: Cardiovascular;  Laterality: N/A;  . Lower extremity angiogram Left 05/27/2012    Procedure: LOWER EXTREMITY ANGIOGRAM;  Surgeon: Serafina Mitchell, MD;  Location: [facility] CATH LAB;  Service: Cardiovascular;  Laterality: Left;  lt leg angio  . Tooth extraction  Sep 08, 2014  . Peripheral vascular catheterization N/A 01/11/2015    Procedure: Lower Extremity Angiography;  Surgeon: Serafina Mitchell, MD;  Location: [place] CV LAB;  Service: Cardiovascular;  Laterality: N/A;  . I&d extremity Left 05/05/2015    Procedure: DEBRIDEMENT HEEL WOUND;  Surgeon: Angelia Mould, MD;  Location: [place];  Service: Vascular;  Laterality: Left;  . Application of wound vac Left 05/05/2015    Procedure: APPLICATION OF WOUND VAC;  Surgeon: Angelia Mould, MD;  Location: [facility] OR;  Service: Vascular;  Laterality: Left;   Family History  Problem Relation Age of Onset  . Hypertension  Father   . Kidney disease Mother    Social History  Substance Use Topics  . Smoking status: Never Smoker   . Smokeless tobacco: Never Used  . Alcohol Use: No   OB History    No data available     Review of Systems  Constitutional: Negative for fever and activity change.  Respiratory: Negative for shortness of breath.   Cardiovascular: Negative for chest pain.  Gastrointestinal: Negative for abdominal pain.  Musculoskeletal:       Foot pain  Neurological: Negative for weakness.  Psychiatric/Behavioral: Negative for agitation.      Allergies  Doxycycline and Peroxyl  Home  Medications   Prior to Admission medications   Medication Sig Start Date End Date Taking? Authorizing Provider  acetaminophen (TYLENOL) 325 MG tablet Take 650 mg by mouth every Monday, Wednesday, and Friday. With dialysis    Historical Provider, MD  CALCIUM-VITAMIN D PO Take 500 mg by mouth 3 (three) times daily with meals.     Historical Provider, MD  ciprofloxacin (CIPRO) 500 MG tablet Take 1 tablet (500 mg total) by mouth 2 (two) times daily. 05/06/15   Alvia Grove, PA-C  Clopidogrel Bisulfate (PLAVIX PO) Take 75 mg by mouth daily.     Historical Provider, MD  Cyanocobalamin (VITAMIN B-12 PO) Take 1 tablet by mouth 4 (four) times a week. Non dialysis days. [place], Sat and Sunday    Historical Provider, MD  diazepam (VALIUM) 5 MG tablet Take 5 mg by mouth every 8 (eight) hours as needed for muscle spasms.  11/15/14   Historical Provider, MD  diltiazem (CARDIZEM CD) 120 MG 24 hr capsule Take 1 capsule (120 mg total) by mouth daily. 03/27/15   Shanker Kristeen Mans, MD  diphenhydrAMINE (BENADRYL) 25 MG tablet Take 25 mg by mouth every 6 (six) hours as needed (Muscle spasm in feet).    Historical Provider, MD  glipiZIDE (GLUCOTROL) 10 MG tablet Take 0.5 tablets (5 mg total) by mouth daily. 11/23/14   Lance Bosch, NP  multivitamin (RENA-VIT) TABS tablet Take 1 tablet by mouth daily.    Historical Provider, MD  neomycin-bacitracin-polymyxin (NEOSPORIN) [phone] ointment Apply 1 application topically daily. Apply to foot    Historical Provider, MD  polyethylene glycol (MIRALAX / GLYCOLAX) packet Take 17 g by mouth daily. Patient taking differently: Take 17 g by mouth daily as needed for mild constipation.  08/19/14   Reyne Dumas, MD  traMADol (ULTRAM) 50 MG tablet Take 1-2 tablets by mouth every 6 hours as needed for foot pain 05/16/15   Sharmon Leyden Nickel, NP  vitamin C (ASCORBIC ACID) 500 MG tablet Take 500 mg by mouth daily.    Historical Provider, MD  vitamin E 400 UNIT capsule Take 400 Units  by mouth daily.    Historical Provider, MD  warfarin (COUMADIN) 5 MG tablet Take 1.5 tablets (7.5 mg total) by mouth daily at 6 PM. 03/27/15   Shanker Kristeen Mans, MD   BP 173/95 mmHg  Pulse 98  Temp(Src) 97.6 F (36.4 C) (Oral)  Ht 5\' 4"  (1.626 m)  Wt 150 lb (68.04 kg)  BMI 25.73 kg/m2  SpO2 100%  LMP  (LMP Unknown) Physical Exam  Constitutional: She is oriented to person, place, and time. She appears well-developed and well-nourished.  HENT:  Head: Normocephalic and atraumatic.  Eyes: Conjunctivae are normal. Right eye exhibits no discharge.  Neck: Neck supple.  Cardiovascular: Normal rate.   No murmur heard. Pulmonary/Chest: Effort normal and breath  sounds normal. She has no wheezes. She has no rales.  Abdominal: Soft. She exhibits no distension. There is no tenderness.  Musculoskeletal: Normal range of motion. She exhibits no edema.  Fistula present.  Left midfoot amputation with chronic nonhealing wound to the heel. No evidence of acute infection. No purulence. Mild warmth.  Neurological: She is oriented to person, place, and time. No cranial nerve deficit.  Skin: Skin is warm. She is not diaphoretic.  Psychiatric: She has a normal mood and affect.  Nursing note and vitals reviewed.   ED Course  Procedures (including critical care time) Labs Review Labs Reviewed - No data to display  Imaging Review No results found. I have personally reviewed and evaluated these images and lab results as part of my medical decision-making.   EKG Interpretation None      MDM   Final diagnoses:  None    Patient is a pleasant 55 year old female end-stage renal disease and peripheral vascular disease presenting with injury to left lower extremity. This is the setting of a left midfoot amputation and chronic nonhealing wound. Wound center was recommending a BKA patient refusing at this time. Patient supposed to be on Cipro to manage infection. Patient endorses having missed a couple  doses. She's got mild warmth surrounding the wound but no active infection. We will get x-ray to make sure the patient does not sustained an injury. If not we'll have patient follow up with her primary care physician as planned.    Halima Fogal Julio Alm, MD 05/29/15 [phone]

## 2015-05-30 DIAGNOSIS — D631 Anemia in chronic kidney disease: Secondary | ICD-10-CM | POA: Diagnosis not present

## 2015-05-30 DIAGNOSIS — N186 End stage renal disease: Secondary | ICD-10-CM | POA: Diagnosis not present

## 2015-05-30 DIAGNOSIS — E1129 Type 2 diabetes mellitus with other diabetic kidney complication: Secondary | ICD-10-CM | POA: Diagnosis not present

## 2015-05-31 ENCOUNTER — Telehealth: Payer: Self-pay | Admitting: *Deleted

## 2015-05-31 DIAGNOSIS — I4891 Unspecified atrial fibrillation: Secondary | ICD-10-CM | POA: Diagnosis not present

## 2015-05-31 DIAGNOSIS — E1122 Type 2 diabetes mellitus with diabetic chronic kidney disease: Secondary | ICD-10-CM | POA: Diagnosis not present

## 2015-05-31 DIAGNOSIS — N186 End stage renal disease: Secondary | ICD-10-CM | POA: Diagnosis not present

## 2015-05-31 DIAGNOSIS — L97421 Non-pressure chronic ulcer of left heel and midfoot limited to breakdown of skin: Secondary | ICD-10-CM | POA: Diagnosis not present

## 2015-05-31 DIAGNOSIS — I120 Hypertensive chronic kidney disease with stage 5 chronic kidney disease or end stage renal disease: Secondary | ICD-10-CM | POA: Diagnosis not present

## 2015-05-31 DIAGNOSIS — E1151 Type 2 diabetes mellitus with diabetic peripheral angiopathy without gangrene: Secondary | ICD-10-CM | POA: Diagnosis not present

## 2015-05-31 NOTE — Telephone Encounter (Signed)
Returning Mrs. [name]'s phone call regarding moving up her appointment to see Dr. [name] scheduled on 06-22-2015.  [name] was seen in office by Dr. [name] on 05-25-2015.  The wound on her left heel was not improving and he recommended left BKA.  [name] was not in agreement with this option.  On 05-29-2015, [name] states she bumped her left heel on a piece of wood, it bled, and she experienced pain.  She went to [facility] ER on 05-29-2015 and was evaluated by Macarthur Critchley MD and x-rays had X-rays done that showed no acute fracture or traumatic subluxation.  [name] states her left leg and left calf are in constant pain since she bumped her left heel.  She denies fever, further bleeding from left heel. Recommended that she keep left leg elevated and keep left heel covered and protected with gauze and Kerlix.  Will have scheduler make an appointment for [name] to see Elizabeth Lick NP  Tomorrow 06-01-2015 at 2:45PM.  Mrs. [name] states she dialyzes tomorrow but can get transportation and make appointment tomorrow afternoon at 2:45 PM.

## 2015-06-01 ENCOUNTER — Encounter: Payer: Self-pay | Admitting: Family

## 2015-06-01 ENCOUNTER — Ambulatory Visit (INDEPENDENT_AMBULATORY_CARE_PROVIDER_SITE_OTHER): Payer: Medicare Other | Admitting: Family

## 2015-06-01 VITALS — BP 140/75 | HR 112 | Temp 100.1°F | Resp 18 | Ht 64.0 in | Wt 150.0 lb

## 2015-06-01 DIAGNOSIS — E1129 Type 2 diabetes mellitus with other diabetic kidney complication: Secondary | ICD-10-CM | POA: Diagnosis not present

## 2015-06-01 DIAGNOSIS — Z992 Dependence on renal dialysis: Secondary | ICD-10-CM

## 2015-06-01 DIAGNOSIS — A4902 Methicillin resistant Staphylococcus aureus infection, unspecified site: Secondary | ICD-10-CM | POA: Diagnosis not present

## 2015-06-01 DIAGNOSIS — N186 End stage renal disease: Secondary | ICD-10-CM | POA: Diagnosis not present

## 2015-06-01 DIAGNOSIS — I7025 Atherosclerosis of native arteries of other extremities with ulceration: Secondary | ICD-10-CM

## 2015-06-01 DIAGNOSIS — D631 Anemia in chronic kidney disease: Secondary | ICD-10-CM | POA: Diagnosis not present

## 2015-06-01 DIAGNOSIS — S91302D Unspecified open wound, left foot, subsequent encounter: Secondary | ICD-10-CM

## 2015-06-01 NOTE — Patient Instructions (Signed)
Peripheral Vascular Disease Peripheral vascular disease (PVD) is a disease of the blood vessels that are not part of your heart and brain. A simple term for PVD is poor circulation. In most cases, PVD narrows the blood vessels that carry blood from your heart to the rest of your body. This can result in a decreased supply of blood to your arms, legs, and internal organs, like your stomach or kidneys. However, it most often affects a person's lower legs and feet. There are two types of PVD.  Organic PVD. This is the more common type. It is caused by damage to the structure of blood vessels.  Functional PVD. This is caused by conditions that make blood vessels contract and tighten (spasm). Without treatment, PVD tends to get worse over time. PVD can also lead to acute ischemic limb. This is when an arm or limb suddenly has trouble getting enough blood. This is a medical emergency. CAUSES Each type of PVD has many different causes. The most common cause of PVD is buildup of a fatty material (plaque) inside of your arteries (atherosclerosis). Small amounts of plaque can break off from the walls of the blood vessels and become lodged in a smaller artery. This blocks blood flow and can cause acute ischemic limb. Other common causes of PVD include:  Blood clots that form inside of blood vessels.  Injuries to blood vessels.  Diseases that cause inflammation of blood vessels or cause blood vessel spasms.  Health behaviors and health history that increase your risk of developing PVD. RISK FACTORS  You may have a greater risk of PVD if you:  Have a family history of PVD.  Have certain medical conditions, including:  High cholesterol.  Diabetes.  High blood pressure (hypertension).  Coronary heart disease.  Past problems with blood clots.  Past injury, such as burns or a broken bone. These may have damaged blood vessels in your limbs.  Buerger disease. This is caused by inflamed blood  vessels in your hands and feet.  Some forms of arthritis.  Rare birth defects that affect the arteries in your legs.  Use tobacco.  Do not get enough exercise.  Are obese.  Are age 50 or older. SIGNS AND SYMPTOMS  PVD may cause many different symptoms. Your symptoms depend on what part of your body is not getting enough blood. Some common signs and symptoms include:  Cramps in your lower legs. This may be a symptom of poor leg circulation (claudication).  Pain and weakness in your legs while you are physically active that goes away when you rest (intermittent claudication).  Leg pain when at rest.  Leg numbness, tingling, or weakness.  Coldness in a leg or foot, especially when compared with the other leg.  Skin or hair changes. These can include:  Hair loss.  Shiny skin.  Pale or bluish skin.  Thick toenails.  Inability to get or maintain an erection (erectile dysfunction). People with PVD are more prone to developing ulcers and sores on their toes, feet, or legs. These may take longer than normal to heal. DIAGNOSIS Your health care provider may diagnose PVD from your signs and symptoms. The health care provider will also do a physical exam. You may have tests to find out what is causing your PVD and determine its severity. Tests may include:  Blood pressure recordings from your arms and legs and measurements of the strength of your pulses (pulse volume recordings).  Imaging studies using sound waves to take pictures of   the blood flow through your blood vessels (Doppler ultrasound).  Injecting a dye into your blood vessels before having imaging studies using:  X-rays (angiogram or arteriogram).  Computer-generated X-rays (CT angiogram).  A powerful electromagnetic field and a computer (magnetic resonance angiogram or MRA). TREATMENT Treatment for PVD depends on the cause of your condition and the severity of your symptoms. It also depends on your age. Underlying  causes need to be treated and controlled. These include long-lasting (chronic) conditions, such as diabetes, high cholesterol, and high blood pressure. You may need to first try making lifestyle changes and taking medicines. Surgery may be needed if these do not work. Lifestyle changes may include:  Quitting smoking.  Exercising regularly.  Following a low-fat, low-cholesterol diet. Medicines may include:  Blood thinners to prevent blood clots.  Medicines to improve blood flow.  Medicines to improve your blood cholesterol levels. Surgical procedures may include:  A procedure that uses an inflated balloon to open a blocked artery and improve blood flow (angioplasty).  A procedure to put in a tube (stent) to keep a blocked artery open (stent implant).  Surgery to reroute blood flow around a blocked artery (peripheral bypass surgery).  Surgery to remove dead tissue from an infected wound on the affected limb.  Amputation. This is surgical removal of the affected limb. This may be necessary in cases of acute ischemic limb that are not improved through medical or surgical treatments. HOME CARE INSTRUCTIONS  Take medicines only as directed by your health care provider.  Do not use any tobacco products, including cigarettes, chewing tobacco, or electronic cigarettes. If you need help quitting, ask your health care provider.  Lose weight if you are overweight, and maintain a healthy weight as directed by your health care provider.  Eat a diet that is low in fat and cholesterol. If you need help, ask your health care provider.  Exercise regularly. Ask your health care provider to suggest some good activities for you.  Use compression stockings or other mechanical devices as directed by your health care provider.  Take good care of your feet.  Wear comfortable shoes that fit well.  Check your feet often for any cuts or sores. SEEK MEDICAL CARE IF:  You have cramps in your legs  while walking.  You have leg pain when you are at rest.  You have coldness in a leg or foot.  Your skin changes.  You have erectile dysfunction.  You have cuts or sores on your feet that are not healing. SEEK IMMEDIATE MEDICAL CARE IF:  Your arm or leg turns cold and blue.  Your arms or legs become red, warm, swollen, painful, or numb.  You have chest pain or trouble breathing.  You suddenly have weakness in your face, arm, or leg.  You become very confused or lose the ability to speak.  You suddenly have a very bad headache or lose your vision.   This information is not intended to replace advice given to you by your health care provider. Make sure you discuss any questions you have with your health care provider.   Document Released: 05/17/2004 Document Revised: 04/30/2014 Document Reviewed: 09/17/2013 Elsevier Interactive Patient Education 2016 [employer]  

## 2015-06-01 NOTE — Progress Notes (Signed)
[facility] OF [place] HISTORY AND PHYSICAL -PAD  History of Present Illness [name] is a 55 y.o. female patient of Dr. [name] who had presented with a nonhealing wound of her left heel. She had undergone a tibial angioplasty with reasonable blood flow to the left foot but had an extensive wound on the heel with exposed bone. Dr. [name] felt that she would best be served with primary below-the-knee amputation however she was not willing to consider this and therefore we agreed to debride the wound and place a VAC. This was done on 05/05/2015. She was discharged on postoperative day #1 on po Cipro. I saw pt on 05/16/15 and the [facility] was removed.  She returns today after she bumped her left heel on a piece of wood on 05-29-2015. [name] states it bled and she experienced pain. She went to [facility] ER on 05-29-2015 and was evaluated by Macarthur Critchley MD and had X-rays done that showed no acute fracture or traumatic subluxation. [name] states her left leg and left calf are in constant pain since she bumped her left heel. She denies fever, further bleeding from left heel.  Pt states she is still taking Cipro, has decreased to once daily from twice daily as she states [place], [place] in the hospital advised her to take it like this.   Dr. [name] last saw pt on 05/25/15. At that time Dr. [name] did not think that the wound on the left heel was improving and he again recommended left below-the-knee amputation. She was still not agreeable. Pt was to return to see Dr. [name] in 3-4 weeks. If the wound progresses hopefully she will be agreeable to proceed with a left below-the-knee amputation. There are no further options for revascularization. Given the extent of the wound, even if there were, Dr. [name] not think that the foot is salvageable.  She is on Coumadin for atrial fibrillation. She dialyzes on Monday Wednesdays and Fridays.  Pt Diabetic: Yes Pt smoker:  non-smoker  Pt meds include: Statin :No Betablocker: No ASA: No Other anticoagulants/antiplatelets: Plavix, Coumadin  Past Medical History  Diagnosis Date  . Hypertension   . CVA (cerebral infarction)     right parietal 05/19/2000  . Vaginal bleeding   . Hyperparathyroidism   . Pneumonia   . Stroke ([place])     2002,no residual  . Peripheral vascular disease ([place])   . GERD (gastroesophageal reflux disease)   . Anemia   . DM (diabetes mellitus) type II controlled peripheral vascular disorder ([place])   . Slip/trip w/o falling due to stepping on object, subs July  2014  . ESRD (end stage renal disease) ([place])     dialysis M/W/F  . Constipation   . Complication of anesthesia     Morphine makes her nauseated, states she has small veins    Social History Social History  Substance Use Topics  . Smoking status: Never Smoker   . Smokeless tobacco: Never Used  . Alcohol Use: No    Family History Family History  Problem Relation Age of Onset  . Hypertension Father   . Kidney disease Mother     Past Surgical History  Procedure Laterality Date  . Parathyroidectomy with autotransplant  12/07/2010  . Knee surgery Right X 2  . Brachial artery graft      x2 for dialysis  . Av fistula placement Right     upper thigh  . Dental extractions Bilateral   . Dilation and curettage  of uterus  1986  . Transmetatarsal amputation Left 07/15/2012    Procedure: TRANSMETATARSAL AMPUTATION;  Surgeon: Angelia Mould, MD;  Location: [facility] OR;  Service: Vascular;  Laterality: Left;  . Leg surgery Right   . Foot surgery Left     5 TOES AMPUTATED  . Abdominal aortagram N/A 05/27/2012    Procedure: ABDOMINAL Maxcine Ham;  Surgeon: Serafina Mitchell, MD;  Location[facility] CATH LAB;  Service: Cardiovascular;  Laterality: N/A;  . Lower extremity angiogram Left 05/27/2012    Procedure: LOWER EXTREMITY ANGIOGRAM;  Surgeon: Serafina Mitchell, MD;  Location: [facility] CATH LAB;  Service: Cardiovascular;  Laterality: Left;   lt leg angio  . Tooth extraction  Sep 08, 2014  . Peripheral vascular catheterization N/A 01/11/2015    Procedure: Lower Extremity Angiography;  Surgeon: Serafina Mitchell, MD;  Location: [place] CV LAB;  Service: Cardiovascular;  Laterality: N/A;  . I&d extremity Left 05/05/2015    Procedure: DEBRIDEMENT HEEL WOUND;  Surgeon: Angelia Mould, MD;  Location: [place];  Service: Vascular;  Laterality: Left;  . Application of wound vac Left 05/05/2015    Procedure: APPLICATION OF WOUND VAC;  Surgeon: Angelia Mould, MD;  Location: [place];  Service: Vascular;  Laterality: Left;    Allergies  Allergen Reactions  . Doxycycline Itching  . Peroxyl [Hydrogen Peroxide] Hives and Rash    Current Outpatient Prescriptions  Medication Sig Dispense Refill  . acetaminophen (TYLENOL) 325 MG tablet Take 650 mg by mouth every Monday, Wednesday, and Friday. With dialysis    . CALCIUM-VITAMIN D PO Take 500 mg by mouth 3 (three) times daily with meals.     . ciprofloxacin (CIPRO) 500 MG tablet Take 1 tablet (500 mg total) by mouth 2 (two) times daily. (Patient taking differently: Take 500 mg by mouth daily. ) 28 tablet 0  . Clopidogrel Bisulfate (PLAVIX PO) Take 75 mg by mouth daily.     . Cyanocobalamin (VITAMIN B-12 PO) Take 1 tablet by mouth 4 (four) times a week. Non dialysis days. Tues, Thurs, Sat and Sunday    . diazepam (VALIUM) 5 MG tablet Take 5 mg by mouth every 8 (eight) hours as needed for muscle spasms.   0  . diltiazem (CARDIZEM CD) 120 MG 24 hr capsule Take 1 capsule (120 mg total) by mouth daily. 60 capsule 0  . diphenhydrAMINE (BENADRYL) 25 MG tablet Take 25 mg by mouth every 6 (six) hours as needed (Muscle spasm in feet).    Marland Kitchen glipiZIDE (GLUCOTROL) 10 MG tablet Take 0.5 tablets (5 mg total) by mouth daily. 45 tablet 3  . multivitamin (RENA-VIT) TABS tablet Take 1 tablet by mouth daily.    Marland Kitchen neomycin-bacitracin-polymyxin (NEOSPORIN) [phone] ointment Apply 1 application topically  daily. Apply to foot    . polyethylene glycol (MIRALAX / GLYCOLAX) packet Take 17 g by mouth daily. (Patient taking differently: Take 17 g by mouth daily as needed for mild constipation. ) 14 each 0  . traMADol (ULTRAM) 50 MG tablet Take 1-2 tablets by mouth every 6 hours as needed for foot pain 50 tablet 0  . vitamin C (ASCORBIC ACID) 500 MG tablet Take 500 mg by mouth daily.    . vitamin E 400 UNIT capsule Take 400 Units by mouth daily.    Marland Kitchen warfarin (COUMADIN) 5 MG tablet Take 1.5 tablets (7.5 mg total) by mouth daily at 6 PM. 60 tablet 0   No current facility-administered medications for this visit.  ROS: See HPI for pertinent positives and negatives.   Physical Examination  Filed Vitals:   06/01/15 1447  BP: 140/75  Pulse: 112  Temp: 100.1 F (37.8 C)  TempSrc: Oral  Resp: 18  Height: 5\' 4"  (1.626 m)  Weight: 150 lb (68.04 kg)  SpO2: 99%   Body mass index is 25.73 kg/(m^2).   General: A&O x 3, WDWN, chronically ill appearing female. Gait: seated in wheelchair Eyes: PERRLA. Pulmonary: Respirations are non labored Cardiac: regular rythm   VASCULAR EXAM: Extremities with ischemic changes: 4.0 cm x 4.5 cm shallow debrided ulcer, dark bone and tissue exposed (was pink at recent visit), medial aspect left heel.  All left toes are surgically absent.   Hemodialysis AV graft present in right upper thigh with palpable thrill.      LE Pulses Right Left   FEMORAL monophasic by Doppler and not palpable non-Dopplerable and not palpable    POPLITEAL not palpable  not palpable   POSTERIOR TIBIAL monophasic by Doppler and not palpable  non-Dopplerable and not palpable    DORSALIS PEDIS  ANTERIOR TIBIAL biphasic by Doppler and not palpable  monophasic by Doppler and not  palpable    Abdomen: soft, NT, no palpable masses. Skin: no rashes, see extremities Musculoskeletal: no muscle wasting or atrophy. Neurologic: A&O X 3; Appropriate Affect ; SENSATION: normal; MOTOR FUNCTION: moving all extremities equally, motor strength 5/5 throughout. Speech is fluent/normal. CN 2-12 intact.         ASSESSMENT: Alizon Koellner is a 55 y.o. female with an extensive nonhealing wound of her left heel. She previously underwent atherectomy with angioplasty of the left anterior tibial artery and left popliteal artery with drug coated balloon angioplasty to the left superficial femoral/popliteal artery by Dr. Trula Slade on 01/11/15. She had excellent results from this. She was seen in the office on 02/16/15 where her left heel ulcer was debrided. She has no further revascularization options. The patient was offered a below-knee-amputation but she was very opposed to this. She's been having home health to do dressing changes.  The left heel open wound has become dark and dry, was pink with shaved exposed bone at a recent visit. Dr. [name] spoke with and examined pt; he again explained that she has no further revascularization options. The best way to get her healing and walking is to have a left BKA, allow to heal for about a month, be fitted for a prosthesis, and she will be able to walk without worry of her non healing wound causing her to have systemic issues. Pt is again not accepting of this option at this time. Dr. [name] explained to pt that if she changes her mind he will be glad to accommodate her.    PLAN:  Based on the patient's HPI and examination, pt will return to clinic as already scheduled on 06/22/15 for left heel wound evaluation by Dr. [name]. Finish once daily Cipro. Continue Home Health daily dressing changes to left heel, use Santyl or debriding agent of their choice.  The patient was given information about PAD including signs, symptoms, treatment,  what symptoms should prompt the patient to seek immediate medical care, and risk reduction measures to take.  Clemon Chambers, RN, MSN, FNP-C [facility] [employer] Phone: [phone]  Clinic MD: [name]  06/01/2015 2:44 PM

## 2015-06-02 ENCOUNTER — Ambulatory Visit (INDEPENDENT_AMBULATORY_CARE_PROVIDER_SITE_OTHER): Payer: Medicare Other | Admitting: Pharmacist Clinician (PhC)/ Clinical Pharmacy Specialist

## 2015-06-02 DIAGNOSIS — I4891 Unspecified atrial fibrillation: Secondary | ICD-10-CM | POA: Diagnosis not present

## 2015-06-02 DIAGNOSIS — L97421 Non-pressure chronic ulcer of left heel and midfoot limited to breakdown of skin: Secondary | ICD-10-CM | POA: Diagnosis not present

## 2015-06-02 DIAGNOSIS — E1151 Type 2 diabetes mellitus with diabetic peripheral angiopathy without gangrene: Secondary | ICD-10-CM | POA: Diagnosis not present

## 2015-06-02 DIAGNOSIS — I120 Hypertensive chronic kidney disease with stage 5 chronic kidney disease or end stage renal disease: Secondary | ICD-10-CM | POA: Diagnosis not present

## 2015-06-02 DIAGNOSIS — E1122 Type 2 diabetes mellitus with diabetic chronic kidney disease: Secondary | ICD-10-CM | POA: Diagnosis not present

## 2015-06-02 DIAGNOSIS — N186 End stage renal disease: Secondary | ICD-10-CM | POA: Diagnosis not present

## 2015-06-02 LAB — POCT INR: INR: 3.3

## 2015-06-02 MED ORDER — COLLAGENASE 250 UNIT/GM EX OINT: 1.0000 "application " | TOPICAL_OINTMENT | Freq: Every day | CUTANEOUS | Status: DC

## 2015-06-02 NOTE — Addendum Note (Signed)
Addended by: Dorthula Rue L on: 06/02/2015 02:14 PM   Modules accepted: Orders

## 2015-06-03 DIAGNOSIS — N186 End stage renal disease: Secondary | ICD-10-CM | POA: Diagnosis not present

## 2015-06-03 DIAGNOSIS — D631 Anemia in chronic kidney disease: Secondary | ICD-10-CM | POA: Diagnosis not present

## 2015-06-03 DIAGNOSIS — E1129 Type 2 diabetes mellitus with other diabetic kidney complication: Secondary | ICD-10-CM | POA: Diagnosis not present

## 2015-06-06 DIAGNOSIS — E1129 Type 2 diabetes mellitus with other diabetic kidney complication: Secondary | ICD-10-CM | POA: Diagnosis not present

## 2015-06-06 DIAGNOSIS — D631 Anemia in chronic kidney disease: Secondary | ICD-10-CM | POA: Diagnosis not present

## 2015-06-06 DIAGNOSIS — N186 End stage renal disease: Secondary | ICD-10-CM | POA: Diagnosis not present

## 2015-06-08 DIAGNOSIS — I4891 Unspecified atrial fibrillation: Secondary | ICD-10-CM | POA: Diagnosis not present

## 2015-06-08 DIAGNOSIS — E1122 Type 2 diabetes mellitus with diabetic chronic kidney disease: Secondary | ICD-10-CM | POA: Diagnosis not present

## 2015-06-08 DIAGNOSIS — N186 End stage renal disease: Secondary | ICD-10-CM | POA: Diagnosis not present

## 2015-06-08 DIAGNOSIS — L97421 Non-pressure chronic ulcer of left heel and midfoot limited to breakdown of skin: Secondary | ICD-10-CM | POA: Diagnosis not present

## 2015-06-08 DIAGNOSIS — E1151 Type 2 diabetes mellitus with diabetic peripheral angiopathy without gangrene: Secondary | ICD-10-CM | POA: Diagnosis not present

## 2015-06-08 DIAGNOSIS — I120 Hypertensive chronic kidney disease with stage 5 chronic kidney disease or end stage renal disease: Secondary | ICD-10-CM | POA: Diagnosis not present

## 2015-06-09 ENCOUNTER — Telehealth: Payer: Self-pay | Admitting: Pharmacist Clinician (PhC)/ Clinical Pharmacy Specialist

## 2015-06-09 ENCOUNTER — Ambulatory Visit (INDEPENDENT_AMBULATORY_CARE_PROVIDER_SITE_OTHER): Payer: Medicare Other | Admitting: Pharmacist Clinician (PhC)/ Clinical Pharmacy Specialist

## 2015-06-09 DIAGNOSIS — A4902 Methicillin resistant Staphylococcus aureus infection, unspecified site: Secondary | ICD-10-CM | POA: Diagnosis not present

## 2015-06-09 DIAGNOSIS — I120 Hypertensive chronic kidney disease with stage 5 chronic kidney disease or end stage renal disease: Secondary | ICD-10-CM | POA: Diagnosis not present

## 2015-06-09 DIAGNOSIS — N186 End stage renal disease: Secondary | ICD-10-CM | POA: Diagnosis not present

## 2015-06-09 DIAGNOSIS — L97421 Non-pressure chronic ulcer of left heel and midfoot limited to breakdown of skin: Secondary | ICD-10-CM | POA: Diagnosis not present

## 2015-06-09 DIAGNOSIS — E1122 Type 2 diabetes mellitus with diabetic chronic kidney disease: Secondary | ICD-10-CM | POA: Diagnosis not present

## 2015-06-09 DIAGNOSIS — I4891 Unspecified atrial fibrillation: Secondary | ICD-10-CM

## 2015-06-09 DIAGNOSIS — D631 Anemia in chronic kidney disease: Secondary | ICD-10-CM | POA: Diagnosis not present

## 2015-06-09 DIAGNOSIS — E1151 Type 2 diabetes mellitus with diabetic peripheral angiopathy without gangrene: Secondary | ICD-10-CM | POA: Diagnosis not present

## 2015-06-09 DIAGNOSIS — E1129 Type 2 diabetes mellitus with other diabetic kidney complication: Secondary | ICD-10-CM | POA: Diagnosis not present

## 2015-06-09 LAB — POCT INR: INR: 2.7

## 2015-06-09 NOTE — Telephone Encounter (Signed)
See anticoag note

## 2015-06-09 NOTE — Telephone Encounter (Signed)
[name] called in from the home of the pt stating that her INR was 2.7 and her PT was 32.4. Her current dosages are 2.5mg  Monday, Wednesday,Friday and Saturday and 5mg  on Sunday and Tuesday . She wanted to know if the pt's dosage needs to be changed. Please f/u with her  Thanks

## 2015-06-10 DIAGNOSIS — N186 End stage renal disease: Secondary | ICD-10-CM | POA: Diagnosis not present

## 2015-06-10 DIAGNOSIS — E1129 Type 2 diabetes mellitus with other diabetic kidney complication: Secondary | ICD-10-CM | POA: Diagnosis not present

## 2015-06-10 DIAGNOSIS — D631 Anemia in chronic kidney disease: Secondary | ICD-10-CM | POA: Diagnosis not present

## 2015-06-13 ENCOUNTER — Ambulatory Visit: Payer: Medicare Other | Admitting: Internal Medicine

## 2015-06-13 DIAGNOSIS — N186 End stage renal disease: Secondary | ICD-10-CM | POA: Diagnosis not present

## 2015-06-13 DIAGNOSIS — D631 Anemia in chronic kidney disease: Secondary | ICD-10-CM | POA: Diagnosis not present

## 2015-06-13 DIAGNOSIS — E1129 Type 2 diabetes mellitus with other diabetic kidney complication: Secondary | ICD-10-CM | POA: Diagnosis not present

## 2015-06-14 ENCOUNTER — Other Ambulatory Visit: Payer: Self-pay | Admitting: *Deleted

## 2015-06-14 DIAGNOSIS — M6281 Muscle weakness (generalized): Secondary | ICD-10-CM

## 2015-06-14 DIAGNOSIS — N186 End stage renal disease: Secondary | ICD-10-CM | POA: Diagnosis not present

## 2015-06-14 DIAGNOSIS — S91302D Unspecified open wound, left foot, subsequent encounter: Secondary | ICD-10-CM

## 2015-06-14 DIAGNOSIS — E1122 Type 2 diabetes mellitus with diabetic chronic kidney disease: Secondary | ICD-10-CM | POA: Diagnosis not present

## 2015-06-14 DIAGNOSIS — L97421 Non-pressure chronic ulcer of left heel and midfoot limited to breakdown of skin: Secondary | ICD-10-CM | POA: Diagnosis not present

## 2015-06-14 DIAGNOSIS — I4891 Unspecified atrial fibrillation: Secondary | ICD-10-CM | POA: Diagnosis not present

## 2015-06-14 DIAGNOSIS — E1151 Type 2 diabetes mellitus with diabetic peripheral angiopathy without gangrene: Secondary | ICD-10-CM | POA: Diagnosis not present

## 2015-06-14 DIAGNOSIS — I120 Hypertensive chronic kidney disease with stage 5 chronic kidney disease or end stage renal disease: Secondary | ICD-10-CM | POA: Diagnosis not present

## 2015-06-14 NOTE — Addendum Note (Signed)
Addended by: Mena Goes on: 06/14/2015 12:05 PM   Modules accepted: Orders

## 2015-06-15 DIAGNOSIS — D631 Anemia in chronic kidney disease: Secondary | ICD-10-CM | POA: Diagnosis not present

## 2015-06-15 DIAGNOSIS — N186 End stage renal disease: Secondary | ICD-10-CM | POA: Diagnosis not present

## 2015-06-15 DIAGNOSIS — E1129 Type 2 diabetes mellitus with other diabetic kidney complication: Secondary | ICD-10-CM | POA: Diagnosis not present

## 2015-06-15 DIAGNOSIS — I482 Chronic atrial fibrillation: Secondary | ICD-10-CM | POA: Diagnosis not present

## 2015-06-15 LAB — PROTIME-INR: INR: 1.8 — AB (ref 0.9–1.1)

## 2015-06-16 ENCOUNTER — Encounter: Payer: Self-pay | Admitting: Vascular Surgery

## 2015-06-16 ENCOUNTER — Ambulatory Visit (INDEPENDENT_AMBULATORY_CARE_PROVIDER_SITE_OTHER): Payer: Medicare Other | Admitting: Pharmacist Clinician (PhC)/ Clinical Pharmacy Specialist

## 2015-06-16 DIAGNOSIS — L97421 Non-pressure chronic ulcer of left heel and midfoot limited to breakdown of skin: Secondary | ICD-10-CM | POA: Diagnosis not present

## 2015-06-16 DIAGNOSIS — E1122 Type 2 diabetes mellitus with diabetic chronic kidney disease: Secondary | ICD-10-CM | POA: Diagnosis not present

## 2015-06-16 DIAGNOSIS — E1151 Type 2 diabetes mellitus with diabetic peripheral angiopathy without gangrene: Secondary | ICD-10-CM | POA: Diagnosis not present

## 2015-06-16 DIAGNOSIS — I4891 Unspecified atrial fibrillation: Secondary | ICD-10-CM | POA: Diagnosis not present

## 2015-06-16 DIAGNOSIS — I120 Hypertensive chronic kidney disease with stage 5 chronic kidney disease or end stage renal disease: Secondary | ICD-10-CM | POA: Diagnosis not present

## 2015-06-16 DIAGNOSIS — N186 End stage renal disease: Secondary | ICD-10-CM | POA: Diagnosis not present

## 2015-06-16 LAB — POCT INR: INR: 1.9

## 2015-06-17 DIAGNOSIS — N186 End stage renal disease: Secondary | ICD-10-CM | POA: Diagnosis not present

## 2015-06-17 DIAGNOSIS — D631 Anemia in chronic kidney disease: Secondary | ICD-10-CM | POA: Diagnosis not present

## 2015-06-17 DIAGNOSIS — E1129 Type 2 diabetes mellitus with other diabetic kidney complication: Secondary | ICD-10-CM | POA: Diagnosis not present

## 2015-06-20 DIAGNOSIS — N186 End stage renal disease: Secondary | ICD-10-CM | POA: Diagnosis not present

## 2015-06-20 DIAGNOSIS — E1129 Type 2 diabetes mellitus with other diabetic kidney complication: Secondary | ICD-10-CM | POA: Diagnosis not present

## 2015-06-20 DIAGNOSIS — D631 Anemia in chronic kidney disease: Secondary | ICD-10-CM | POA: Diagnosis not present

## 2015-06-21 DIAGNOSIS — E1122 Type 2 diabetes mellitus with diabetic chronic kidney disease: Secondary | ICD-10-CM | POA: Diagnosis not present

## 2015-06-21 DIAGNOSIS — I120 Hypertensive chronic kidney disease with stage 5 chronic kidney disease or end stage renal disease: Secondary | ICD-10-CM | POA: Diagnosis not present

## 2015-06-21 DIAGNOSIS — Z992 Dependence on renal dialysis: Secondary | ICD-10-CM | POA: Diagnosis not present

## 2015-06-21 DIAGNOSIS — I4891 Unspecified atrial fibrillation: Secondary | ICD-10-CM | POA: Diagnosis not present

## 2015-06-21 DIAGNOSIS — L97421 Non-pressure chronic ulcer of left heel and midfoot limited to breakdown of skin: Secondary | ICD-10-CM | POA: Diagnosis not present

## 2015-06-21 DIAGNOSIS — N186 End stage renal disease: Secondary | ICD-10-CM | POA: Diagnosis not present

## 2015-06-21 DIAGNOSIS — E1129 Type 2 diabetes mellitus with other diabetic kidney complication: Secondary | ICD-10-CM | POA: Diagnosis not present

## 2015-06-21 DIAGNOSIS — E1151 Type 2 diabetes mellitus with diabetic peripheral angiopathy without gangrene: Secondary | ICD-10-CM | POA: Diagnosis not present

## 2015-06-22 ENCOUNTER — Encounter: Payer: Self-pay | Admitting: *Deleted

## 2015-06-22 ENCOUNTER — Other Ambulatory Visit: Payer: Self-pay

## 2015-06-22 ENCOUNTER — Ambulatory Visit (INDEPENDENT_AMBULATORY_CARE_PROVIDER_SITE_OTHER): Payer: Medicare Other | Admitting: Vascular Surgery

## 2015-06-22 ENCOUNTER — Encounter: Payer: Self-pay | Admitting: Vascular Surgery

## 2015-06-22 VITALS — BP 146/74 | HR 110 | Temp 100.0°F | Resp 18 | Ht 64.0 in | Wt 150.0 lb

## 2015-06-22 DIAGNOSIS — E1122 Type 2 diabetes mellitus with diabetic chronic kidney disease: Secondary | ICD-10-CM | POA: Diagnosis not present

## 2015-06-22 DIAGNOSIS — N2581 Secondary hyperparathyroidism of renal origin: Secondary | ICD-10-CM | POA: Diagnosis not present

## 2015-06-22 DIAGNOSIS — E1129 Type 2 diabetes mellitus with other diabetic kidney complication: Secondary | ICD-10-CM | POA: Diagnosis not present

## 2015-06-22 DIAGNOSIS — N186 End stage renal disease: Secondary | ICD-10-CM | POA: Diagnosis not present

## 2015-06-22 DIAGNOSIS — D631 Anemia in chronic kidney disease: Secondary | ICD-10-CM | POA: Diagnosis not present

## 2015-06-22 DIAGNOSIS — I120 Hypertensive chronic kidney disease with stage 5 chronic kidney disease or end stage renal disease: Secondary | ICD-10-CM | POA: Diagnosis not present

## 2015-06-22 DIAGNOSIS — L97424 Non-pressure chronic ulcer of left heel and midfoot with necrosis of bone: Secondary | ICD-10-CM | POA: Diagnosis not present

## 2015-06-22 DIAGNOSIS — I4891 Unspecified atrial fibrillation: Secondary | ICD-10-CM | POA: Diagnosis not present

## 2015-06-22 DIAGNOSIS — I70262 Atherosclerosis of native arteries of extremities with gangrene, left leg: Secondary | ICD-10-CM | POA: Diagnosis not present

## 2015-06-22 DIAGNOSIS — E1151 Type 2 diabetes mellitus with diabetic peripheral angiopathy without gangrene: Secondary | ICD-10-CM | POA: Diagnosis not present

## 2015-06-22 NOTE — Progress Notes (Signed)
Vascular and Vein Specialist of [facility]  Patient name: [name] MRN: [id number] DOB: 1961-02-26 Sex: female  REASON FOR VISIT: Nonhealing left heel wound.  HPI: [name] is a 55 y.o. female, who was last seen in our office on 06/01/2015. She has an extensive wound on the left heel. She has undergone previous atherectomy and angioplasty of the left anterior tibial artery and left popliteal artery with a drug-coated balloon by Dr. Trula Slade on 01/11/2015. She had an excellent result from this. However, her left heel wound was quite extensive and I did not think this was salvageable. We have discussed below the knee amputation on multiple occasions and she was against this. Therefore, on 05/05/2015, she underwent excisional debridement of the left heel with placement of a VAC.   She comes in today as she is now agreeable to consider left below the knee amputation. She has had significant pain in the left heel. She last took her Coumadin last night.  Past Medical History  Diagnosis Date  . Hypertension   . CVA (cerebral infarction)     right parietal 05/19/2000  . Vaginal bleeding   . Hyperparathyroidism   . Pneumonia   . Stroke ([place])     2002,no residual  . Peripheral vascular disease ([place])   . GERD (gastroesophageal reflux disease)   . Anemia   . DM (diabetes mellitus) type II controlled peripheral vascular disorder ([place])   . Slip/trip w/o falling due to stepping on object, subs July  2014  . ESRD (end stage renal disease) ([place])     dialysis M/W/F  . Constipation   . Complication of anesthesia     Morphine makes her nauseated, states she has small veins    Family History  Problem Relation Age of Onset  . Hypertension Father   . Kidney disease Mother     SOCIAL HISTORY: Social History   Social History  . Marital Status: Single    Spouse Name: N/A  . Number of Children: N/A  . Years of Education: N/A   Occupational History  . Not on file.   Social History  Main Topics  . Smoking status: Never Smoker   . Smokeless tobacco: Never Used  . Alcohol Use: No  . Drug Use: No  . Sexual Activity: No   Other Topics Concern  . Not on file   Social History Narrative    Allergies  Allergen Reactions  . Doxycycline Itching  . Peroxyl [Hydrogen Peroxide] Hives and Rash    Current Outpatient Prescriptions  Medication Sig Dispense Refill  . acetaminophen (TYLENOL) 325 MG tablet Take 650 mg by mouth every Monday, Wednesday, and Friday. With dialysis    . CALCIUM-VITAMIN D PO Take 500 mg by mouth 3 (three) times daily with meals.     . ciprofloxacin (CIPRO) 500 MG tablet Take 1 tablet (500 mg total) by mouth 2 (two) times daily. (Patient taking differently: Take 500 mg by mouth daily. ) 28 tablet 0  . Clopidogrel Bisulfate (PLAVIX PO) Take 75 mg by mouth daily.     . collagenase (SANTYL) ointment Apply 1 application topically daily. Apply daily to Left Heel  And cover with dry sterile dressing. 30 g 1  . Cyanocobalamin (VITAMIN B-12 PO) Take 1 tablet by mouth 4 (four) times a week. Non dialysis days. Tues, Thurs, Sat and Sunday    . diazepam (VALIUM) 5 MG tablet Take 5 mg by mouth every 8 (eight) hours as needed for muscle spasms.  0  . diltiazem (CARDIZEM CD) 120 MG 24 hr capsule Take 1 capsule (120 mg total) by mouth daily. 60 capsule 0  . diphenhydrAMINE (BENADRYL) 25 MG tablet Take 25 mg by mouth every 6 (six) hours as needed (Muscle spasm in feet).    Marland Kitchen glipiZIDE (GLUCOTROL) 10 MG tablet Take 0.5 tablets (5 mg total) by mouth daily. 45 tablet 3  . multivitamin (RENA-VIT) TABS tablet Take 1 tablet by mouth daily.    Marland Kitchen neomycin-bacitracin-polymyxin (NEOSPORIN) [phone] ointment Apply 1 application topically daily. Apply to foot    . polyethylene glycol (MIRALAX / GLYCOLAX) packet Take 17 g by mouth daily. (Patient taking differently: Take 17 g by mouth daily as needed for mild constipation. ) 14 each 0  . traMADol (ULTRAM) 50 MG tablet Take 1-2  tablets by mouth every 6 hours as needed for foot pain 50 tablet 0  . vitamin C (ASCORBIC ACID) 500 MG tablet Take 500 mg by mouth daily.    . vitamin E 400 UNIT capsule Take 400 Units by mouth daily.    Marland Kitchen warfarin (COUMADIN) 5 MG tablet Take 1.5 tablets (7.5 mg total) by mouth daily at 6 PM. 60 tablet 0   No current facility-administered medications for this visit.    REVIEW OF SYSTEMS:  [X]  denotes positive finding, [ ]  denotes negative finding Cardiac  Comments:  Chest pain or chest pressure:    Shortness of breath upon exertion:    Short of breath when lying flat:    Irregular heart rhythm:        Vascular    Pain in calf, thigh, or hip brought on by ambulation:    Pain in feet at night that wakes you up from your sleep:     Blood clot in your veins:    Leg swelling:         Pulmonary    Oxygen at home:    Productive cough:     Wheezing:         Neurologic    Sudden weakness in arms or legs:     Sudden numbness in arms or legs:     Sudden onset of difficulty speaking or slurred speech:    Temporary loss of vision in one eye:     Problems with dizziness:         Gastrointestinal    Blood in stool:     Vomited blood:         Genitourinary    Burning when urinating:     Blood in urine:        Psychiatric    Major depression:         Hematologic    Bleeding problems:    Problems with blood clotting too easily:        Skin    Rashes or ulcers:        Constitutional    Fever or chills:      PHYSICAL EXAM: Filed Vitals:   06/22/15 1510 06/22/15 1515  BP: 139/75 146/74  Pulse: 110 110  Temp:  100 F (37.8 C)  TempSrc:  Oral  Resp:  18  Height:  5\' 4"  (1.626 m)  Weight:  150 lb (68.04 kg)  SpO2:  99%    GENERAL: The patient is a well-nourished female, in no acute distress. The vital signs are documented above. CARDIAC: There is a regular rate and rhythm.  VASCULAR: I do not detect carotid bruits. She has palpable femoral pulses. I  cannot palpate  pulses in the left foot or the right foot. PULMONARY: There is good air exchange bilaterally without wheezing or rales. ABDOMEN: Soft and non-tender with normal pitched bowel sounds.  MUSCULOSKELETAL: There are no major deformities or cyanosis. NEUROLOGIC: No focal weakness or paresthesias are detected. SKIN: She has an extensive wound on the left heel with exposed bone. There is dry gangrene of the heel. PSYCHIATRIC: The patient has a normal affect.   MEDICAL ISSUES:  GANGRENE LEFT HEEL: The patient is now agreeable to proceed with a left below the knee amputation. We have discussed the indications for the procedure and the risks including a 10% of nonhealing. She is very concerned about being able to continue to live independently and I think she is a good candidate for rehabilitation and obtaining a prosthesis to try to get her home again safely. We will stop her Coumadin and plan on surgery on 06/28/2015.  HYPERTENSION: The patient's initial blood pressure today was elevated. We repeated this and this was still elevated. We have encouraged the patient to follow up with their primary care physician for management of their blood pressure.   Deitra Mayo [facility] of [place]: [phone]

## 2015-06-22 NOTE — Progress Notes (Signed)
Filed Vitals:   06/22/15 1510  BP: 139/75  Pulse: 110

## 2015-06-23 ENCOUNTER — Ambulatory Visit (INDEPENDENT_AMBULATORY_CARE_PROVIDER_SITE_OTHER): Payer: Medicare Other | Admitting: Pharmacist Clinician (PhC)/ Clinical Pharmacy Specialist

## 2015-06-23 ENCOUNTER — Other Ambulatory Visit: Payer: Self-pay | Admitting: Pharmacist Clinician (PhC)/ Clinical Pharmacy Specialist

## 2015-06-23 DIAGNOSIS — E1151 Type 2 diabetes mellitus with diabetic peripheral angiopathy without gangrene: Secondary | ICD-10-CM | POA: Diagnosis not present

## 2015-06-23 DIAGNOSIS — E1122 Type 2 diabetes mellitus with diabetic chronic kidney disease: Secondary | ICD-10-CM | POA: Diagnosis not present

## 2015-06-23 DIAGNOSIS — N186 End stage renal disease: Secondary | ICD-10-CM | POA: Diagnosis not present

## 2015-06-23 DIAGNOSIS — I120 Hypertensive chronic kidney disease with stage 5 chronic kidney disease or end stage renal disease: Secondary | ICD-10-CM | POA: Diagnosis not present

## 2015-06-23 DIAGNOSIS — I4891 Unspecified atrial fibrillation: Secondary | ICD-10-CM | POA: Diagnosis not present

## 2015-06-23 DIAGNOSIS — L97424 Non-pressure chronic ulcer of left heel and midfoot with necrosis of bone: Secondary | ICD-10-CM | POA: Diagnosis not present

## 2015-06-23 MED ORDER — WARFARIN SODIUM 5 MG PO TABS: ORAL_TABLET | ORAL | Status: DC

## 2015-06-23 NOTE — Progress Notes (Signed)
Patient ran out of warfarin last week.  Now scheduled to have below knee amputation on Tuesday.  Will hold warfarin until after surgery.  [place] RN to resume care after d/c unless patient sent to SNF

## 2015-06-24 DIAGNOSIS — N186 End stage renal disease: Secondary | ICD-10-CM | POA: Diagnosis not present

## 2015-06-24 DIAGNOSIS — E1129 Type 2 diabetes mellitus with other diabetic kidney complication: Secondary | ICD-10-CM | POA: Diagnosis not present

## 2015-06-24 DIAGNOSIS — D631 Anemia in chronic kidney disease: Secondary | ICD-10-CM | POA: Diagnosis not present

## 2015-06-24 DIAGNOSIS — N2581 Secondary hyperparathyroidism of renal origin: Secondary | ICD-10-CM | POA: Diagnosis not present

## 2015-06-27 ENCOUNTER — Encounter (HOSPITAL_COMMUNITY): Payer: Self-pay | Admitting: *Deleted

## 2015-06-27 DIAGNOSIS — D631 Anemia in chronic kidney disease: Secondary | ICD-10-CM | POA: Diagnosis not present

## 2015-06-27 DIAGNOSIS — N186 End stage renal disease: Secondary | ICD-10-CM | POA: Diagnosis not present

## 2015-06-27 DIAGNOSIS — E1129 Type 2 diabetes mellitus with other diabetic kidney complication: Secondary | ICD-10-CM | POA: Diagnosis not present

## 2015-06-27 DIAGNOSIS — N2581 Secondary hyperparathyroidism of renal origin: Secondary | ICD-10-CM | POA: Diagnosis not present

## 2015-06-27 NOTE — Progress Notes (Signed)
Pt denies any recent chest pain or sob. Hx of a-fib. Pt is diabetic, states she doesn't check her blood sugar at home and is not sure what they are at dialysis. Last A1C was 7.4 on 12/2 /16.

## 2015-06-28 ENCOUNTER — Inpatient Hospital Stay (HOSPITAL_COMMUNITY)
Admission: RE | Admit: 2015-06-28 | Discharge: 2015-07-02 | DRG: 239 | Disposition: A | Discharge status: Skilled Nursing Facility | Payer: Medicare Other | Source: Ambulatory Visit | Attending: Vascular Surgery | Admitting: Vascular Surgery

## 2015-06-28 ENCOUNTER — Encounter (HOSPITAL_COMMUNITY): Payer: Self-pay | Admitting: *Deleted

## 2015-06-28 ENCOUNTER — Inpatient Hospital Stay (HOSPITAL_COMMUNITY): Payer: Medicare Other | Admitting: Anesthesiology

## 2015-06-28 ENCOUNTER — Encounter (HOSPITAL_COMMUNITY)
Admission: RE | Disposition: A | Discharge status: Skilled Nursing Facility | Payer: Self-pay | Source: Ambulatory Visit | Attending: Vascular Surgery

## 2015-06-28 DIAGNOSIS — N186 End stage renal disease: Secondary | ICD-10-CM | POA: Diagnosis present

## 2015-06-28 DIAGNOSIS — I120 Hypertensive chronic kidney disease with stage 5 chronic kidney disease or end stage renal disease: Secondary | ICD-10-CM | POA: Diagnosis present

## 2015-06-28 DIAGNOSIS — K219 Gastro-esophageal reflux disease without esophagitis: Secondary | ICD-10-CM | POA: Diagnosis present

## 2015-06-28 DIAGNOSIS — I480 Paroxysmal atrial fibrillation: Secondary | ICD-10-CM | POA: Diagnosis not present

## 2015-06-28 DIAGNOSIS — I69898 Other sequelae of other cerebrovascular disease: Secondary | ICD-10-CM | POA: Diagnosis not present

## 2015-06-28 DIAGNOSIS — Z7901 Long term (current) use of anticoagulants: Secondary | ICD-10-CM

## 2015-06-28 DIAGNOSIS — Z79899 Other long term (current) drug therapy: Secondary | ICD-10-CM

## 2015-06-28 DIAGNOSIS — Z992 Dependence on renal dialysis: Secondary | ICD-10-CM

## 2015-06-28 DIAGNOSIS — I10 Essential (primary) hypertension: Secondary | ICD-10-CM | POA: Diagnosis not present

## 2015-06-28 DIAGNOSIS — E213 Hyperparathyroidism, unspecified: Secondary | ICD-10-CM | POA: Diagnosis present

## 2015-06-28 DIAGNOSIS — N2581 Secondary hyperparathyroidism of renal origin: Secondary | ICD-10-CM | POA: Diagnosis present

## 2015-06-28 DIAGNOSIS — R6510 Systemic inflammatory response syndrome (SIRS) of non-infectious origin without acute organ dysfunction: Secondary | ICD-10-CM | POA: Diagnosis present

## 2015-06-28 DIAGNOSIS — L97829 Non-pressure chronic ulcer of other part of left lower leg with unspecified severity: Secondary | ICD-10-CM | POA: Diagnosis not present

## 2015-06-28 DIAGNOSIS — Z8249 Family history of ischemic heart disease and other diseases of the circulatory system: Secondary | ICD-10-CM

## 2015-06-28 DIAGNOSIS — Z8673 Personal history of transient ischemic attack (TIA), and cerebral infarction without residual deficits: Secondary | ICD-10-CM

## 2015-06-28 DIAGNOSIS — Z89512 Acquired absence of left leg below knee: Secondary | ICD-10-CM | POA: Diagnosis not present

## 2015-06-28 DIAGNOSIS — Z7984 Long term (current) use of oral hypoglycemic drugs: Secondary | ICD-10-CM | POA: Diagnosis not present

## 2015-06-28 DIAGNOSIS — D62 Acute posthemorrhagic anemia: Secondary | ICD-10-CM | POA: Diagnosis not present

## 2015-06-28 DIAGNOSIS — E088 Diabetes mellitus due to underlying condition with unspecified complications: Secondary | ICD-10-CM | POA: Diagnosis not present

## 2015-06-28 DIAGNOSIS — I739 Peripheral vascular disease, unspecified: Secondary | ICD-10-CM | POA: Diagnosis present

## 2015-06-28 DIAGNOSIS — Z841 Family history of disorders of kidney and ureter: Secondary | ICD-10-CM

## 2015-06-28 DIAGNOSIS — K5900 Constipation, unspecified: Secondary | ICD-10-CM | POA: Diagnosis present

## 2015-06-28 DIAGNOSIS — E1142 Type 2 diabetes mellitus with diabetic polyneuropathy: Secondary | ICD-10-CM | POA: Insufficient documentation

## 2015-06-28 DIAGNOSIS — E1122 Type 2 diabetes mellitus with diabetic chronic kidney disease: Secondary | ICD-10-CM | POA: Diagnosis present

## 2015-06-28 DIAGNOSIS — E1152 Type 2 diabetes mellitus with diabetic peripheral angiopathy with gangrene: Secondary | ICD-10-CM | POA: Diagnosis not present

## 2015-06-28 DIAGNOSIS — I4891 Unspecified atrial fibrillation: Secondary | ICD-10-CM | POA: Diagnosis not present

## 2015-06-28 DIAGNOSIS — S91309A Unspecified open wound, unspecified foot, initial encounter: Secondary | ICD-10-CM | POA: Diagnosis not present

## 2015-06-28 DIAGNOSIS — Z22322 Carrier or suspected carrier of Methicillin resistant Staphylococcus aureus: Secondary | ICD-10-CM

## 2015-06-28 DIAGNOSIS — R269 Unspecified abnormalities of gait and mobility: Secondary | ICD-10-CM | POA: Insufficient documentation

## 2015-06-28 DIAGNOSIS — K5909 Other constipation: Secondary | ICD-10-CM | POA: Diagnosis not present

## 2015-06-28 DIAGNOSIS — Z7902 Long term (current) use of antithrombotics/antiplatelets: Secondary | ICD-10-CM | POA: Diagnosis not present

## 2015-06-28 DIAGNOSIS — Z89519 Acquired absence of unspecified leg below knee: Secondary | ICD-10-CM | POA: Insufficient documentation

## 2015-06-28 DIAGNOSIS — D638 Anemia in other chronic diseases classified elsewhere: Secondary | ICD-10-CM | POA: Insufficient documentation

## 2015-06-28 DIAGNOSIS — D599 Acquired hemolytic anemia, unspecified: Secondary | ICD-10-CM | POA: Diagnosis not present

## 2015-06-28 DIAGNOSIS — D631 Anemia in chronic kidney disease: Secondary | ICD-10-CM | POA: Diagnosis present

## 2015-06-28 DIAGNOSIS — G8918 Other acute postprocedural pain: Secondary | ICD-10-CM | POA: Insufficient documentation

## 2015-06-28 DIAGNOSIS — T8859XS Other complications of anesthesia, sequela: Secondary | ICD-10-CM | POA: Diagnosis not present

## 2015-06-28 DIAGNOSIS — E8889 Other specified metabolic disorders: Secondary | ICD-10-CM | POA: Diagnosis present

## 2015-06-28 DIAGNOSIS — Z91048 Other nonmedicinal substance allergy status: Secondary | ICD-10-CM

## 2015-06-28 DIAGNOSIS — Z881 Allergy status to other antibiotic agents status: Secondary | ICD-10-CM

## 2015-06-28 DIAGNOSIS — L97409 Non-pressure chronic ulcer of unspecified heel and midfoot with unspecified severity: Secondary | ICD-10-CM | POA: Diagnosis not present

## 2015-06-28 DIAGNOSIS — I70262 Atherosclerosis of native arteries of extremities with gangrene, left leg: Secondary | ICD-10-CM | POA: Diagnosis not present

## 2015-06-28 DIAGNOSIS — N939 Abnormal uterine and vaginal bleeding, unspecified: Secondary | ICD-10-CM | POA: Diagnosis not present

## 2015-06-28 HISTORY — PX: AMPUTATION: SHX166

## 2015-06-28 HISTORY — DX: Unspecified atrial fibrillation: I48.91

## 2015-06-28 LAB — COMPREHENSIVE METABOLIC PANEL
ALK PHOS: 54 U/L (ref 38–126)
ALT: 15 U/L (ref 14–54)
AST: 20 U/L (ref 15–41)
Albumin: 2.8 g/dL — ABNORMAL LOW (ref 3.5–5.0)
Anion gap: 17 — ABNORMAL HIGH (ref 5–15)
BUN: 22 mg/dL — AB (ref 6–20)
CALCIUM: 8.9 mg/dL (ref 8.9–10.3)
CHLORIDE: 96 mmol/L — AB (ref 101–111)
CO2: 25 mmol/L (ref 22–32)
CREATININE: 6.68 mg/dL — AB (ref 0.44–1.00)
GFR calc Af Amer: 7 mL/min — ABNORMAL LOW (ref >60)
GFR calc non Af Amer: 6 mL/min — ABNORMAL LOW (ref >60)
GLUCOSE: 156 mg/dL — AB (ref 65–99)
Potassium: 3.6 mmol/L (ref 3.5–5.1)
SODIUM: 138 mmol/L (ref 135–145)
Total Bilirubin: 0.7 mg/dL (ref 0.3–1.2)
Total Protein: 7.9 g/dL (ref 6.5–8.1)

## 2015-06-28 LAB — CBC
HCT: 28.7 % — ABNORMAL LOW (ref 36.0–46.0)
HEMOGLOBIN: 8.8 g/dL — AB (ref 12.0–15.0)
MCH: 25.4 pg — ABNORMAL LOW (ref 26.0–34.0)
MCHC: 30.7 g/dL (ref 30.0–36.0)
MCV: 82.9 fL (ref 78.0–100.0)
PLATELETS: 419 10*3/uL — AB (ref 150–400)
RBC: 3.46 MIL/uL — AB (ref 3.87–5.11)
RDW: 17.7 % — ABNORMAL HIGH (ref 11.5–15.5)
WBC: 8.5 10*3/uL (ref 4.0–10.5)

## 2015-06-28 LAB — SURGICAL PCR SCREEN
MRSA, PCR: NEGATIVE
STAPHYLOCOCCUS AUREUS: POSITIVE — AB

## 2015-06-28 LAB — GLUCOSE, CAPILLARY
GLUCOSE-CAPILLARY: 122 mg/dL — AB (ref 65–99)
Glucose-Capillary: 147 mg/dL — ABNORMAL HIGH (ref 65–99)
Glucose-Capillary: 124 mg/dL — ABNORMAL HIGH (ref 65–99)

## 2015-06-28 LAB — PROTIME-INR
INR: 1.32 (ref 0.00–1.49)
Prothrombin Time: 16.5 seconds — ABNORMAL HIGH (ref 11.6–15.2)

## 2015-06-28 LAB — APTT: aPTT: 41 seconds — ABNORMAL HIGH (ref 24–37)

## 2015-06-28 SURGERY — AMPUTATION BELOW KNEE
Anesthesia: General | Site: Leg Lower | Laterality: Left

## 2015-06-28 MED ORDER — HYDROMORPHONE HCL 1 MG/ML IJ SOLN
INTRAMUSCULAR | Status: AC
  Filled 2015-06-28: qty 1

## 2015-06-28 MED ORDER — SODIUM CHLORIDE 0.9 % IV SOLN
125.0000 mg | INTRAVENOUS | Status: DC
  Administered 2015-06-29 – 2015-07-01 (×2): 125 mg via INTRAVENOUS
  Filled 2015-06-28 (×4): qty 10

## 2015-06-28 MED ORDER — ONDANSETRON HCL 4 MG/2ML IJ SOLN
INTRAMUSCULAR | Status: DC | PRN
  Administered 2015-06-28: 4 mg via INTRAVENOUS

## 2015-06-28 MED ORDER — PHENYLEPHRINE HCL 10 MG/ML IJ SOLN
10.0000 mg | INTRAMUSCULAR | Status: DC | PRN
  Administered 2015-06-28: 30 ug/min via INTRAVENOUS

## 2015-06-28 MED ORDER — WARFARIN SODIUM 6 MG PO TABS
6.0000 mg | ORAL_TABLET | Freq: Once | ORAL | Status: AC
  Administered 2015-06-28: 6 mg via ORAL
  Filled 2015-06-28: qty 1

## 2015-06-28 MED ORDER — 0.9 % SODIUM CHLORIDE (POUR BTL) OPTIME
TOPICAL | Status: DC | PRN
  Administered 2015-06-28: 1000 mL

## 2015-06-28 MED ORDER — HEPARIN SODIUM (PORCINE) 1000 UNIT/ML DIALYSIS: 1000.0000 [IU] | INTRAMUSCULAR | Status: DC | PRN

## 2015-06-28 MED ORDER — DOCUSATE SODIUM 100 MG PO CAPS
100.0000 mg | ORAL_CAPSULE | Freq: Every day | ORAL | Status: DC
  Administered 2015-06-29 – 2015-07-02 (×4): 100 mg via ORAL
  Filled 2015-06-28 (×5): qty 1

## 2015-06-28 MED ORDER — LIDOCAINE HCL (PF) 1 % IJ SOLN: 5.0000 mL | INTRAMUSCULAR | Status: DC | PRN

## 2015-06-28 MED ORDER — MORPHINE SULFATE (PF) 2 MG/ML IV SOLN
2.0000 mg | INTRAVENOUS | Status: DC | PRN
  Administered 2015-06-28 – 2015-07-01 (×3): 2 mg via INTRAVENOUS
  Filled 2015-06-28 (×3): qty 1

## 2015-06-28 MED ORDER — RENA-VITE PO TABS
1.0000 | ORAL_TABLET | Freq: Every day | ORAL | Status: DC
  Administered 2015-06-29 – 2015-07-01 (×3): 1 via ORAL
  Filled 2015-06-28 (×4): qty 1

## 2015-06-28 MED ORDER — DARBEPOETIN ALFA 200 MCG/0.4ML IJ SOSY
200.0000 ug | PREFILLED_SYRINGE | INTRAMUSCULAR | Status: DC
  Administered 2015-06-29: 200 ug via INTRAVENOUS

## 2015-06-28 MED ORDER — VITAMIN C 500 MG PO TABS
500.0000 mg | ORAL_TABLET | Freq: Every day | ORAL | Status: DC
  Administered 2015-06-28 – 2015-07-02 (×5): 500 mg via ORAL
  Filled 2015-06-28 (×5): qty 1

## 2015-06-28 MED ORDER — BACITRACIN ZINC 500 UNIT/GM EX OINT
TOPICAL_OINTMENT | CUTANEOUS | Status: AC
  Filled 2015-06-28: qty 28.35

## 2015-06-28 MED ORDER — WARFARIN - PHARMACIST DOSING INPATIENT
Freq: Every day | Status: DC
Start: 2015-06-28 — End: 2015-07-02
  Administered 2015-06-28 – 2015-06-29 (×2)

## 2015-06-28 MED ORDER — GUAIFENESIN-DM 100-10 MG/5ML PO SYRP: 15.0000 mL | ORAL_SOLUTION | ORAL | Status: DC | PRN

## 2015-06-28 MED ORDER — ACETAMINOPHEN 325 MG PO TABS
650.0000 mg | ORAL_TABLET | ORAL | Status: DC
  Administered 2015-06-29: 650 mg via ORAL
  Filled 2015-06-28: qty 2

## 2015-06-28 MED ORDER — MUPIROCIN 2 % EX OINT
1.0000 "application " | TOPICAL_OINTMENT | Freq: Once | CUTANEOUS | Status: AC
  Administered 2015-06-28: 1 via TOPICAL
  Filled 2015-06-28: qty 22

## 2015-06-28 MED ORDER — ALTEPLASE 2 MG IJ SOLR
2.0000 mg | Freq: Once | INTRAMUSCULAR | Status: DC | PRN
  Filled 2015-06-28: qty 2

## 2015-06-28 MED ORDER — CEFUROXIME SODIUM 1.5 G IJ SOLR
1.5000 g | INTRAMUSCULAR | Status: AC
  Administered 2015-06-28: 1.5 g via INTRAVENOUS
  Filled 2015-06-28: qty 1.5

## 2015-06-28 MED ORDER — MIDAZOLAM HCL 5 MG/5ML IJ SOLN
INTRAMUSCULAR | Status: DC | PRN
  Administered 2015-06-28: 2 mg via INTRAVENOUS

## 2015-06-28 MED ORDER — SODIUM CHLORIDE 0.9 % IV SOLN
INTRAVENOUS | Status: DC
  Administered 2015-06-28: 10:00:00 via INTRAVENOUS

## 2015-06-28 MED ORDER — LIDOCAINE HCL (CARDIAC) 20 MG/ML IV SOLN
INTRAVENOUS | Status: DC | PRN
  Administered 2015-06-28: 40 mg via INTRAVENOUS

## 2015-06-28 MED ORDER — FENTANYL CITRATE (PF) 250 MCG/5ML IJ SOLN
INTRAMUSCULAR | Status: AC
  Filled 2015-06-28: qty 5

## 2015-06-28 MED ORDER — SODIUM CHLORIDE 0.9% FLUSH
3.0000 mL | Freq: Two times a day (BID) | INTRAVENOUS | Status: DC
  Administered 2015-06-28: 3 mL via INTRAVENOUS

## 2015-06-28 MED ORDER — CLOPIDOGREL BISULFATE 75 MG PO TABS
75.0000 mg | ORAL_TABLET | Freq: Every day | ORAL | Status: DC
  Administered 2015-06-29 – 2015-07-02 (×4): 75 mg via ORAL
  Filled 2015-06-28 (×4): qty 1

## 2015-06-28 MED ORDER — PANTOPRAZOLE SODIUM 40 MG PO TBEC
40.0000 mg | DELAYED_RELEASE_TABLET | Freq: Every day | ORAL | Status: DC
  Administered 2015-06-29 – 2015-07-02 (×4): 40 mg via ORAL
  Filled 2015-06-28 (×4): qty 1

## 2015-06-28 MED ORDER — POLYETHYLENE GLYCOL 3350 17 G PO PACK
17.0000 g | PACK | Freq: Every day | ORAL | Status: DC | PRN
Start: 2015-06-28 — End: 2015-07-02
  Administered 2015-07-01: 17 g via ORAL
  Filled 2015-06-28: qty 1

## 2015-06-28 MED ORDER — ROCURONIUM BROMIDE 50 MG/5ML IV SOLN
INTRAVENOUS | Status: AC
  Filled 2015-06-28: qty 1

## 2015-06-28 MED ORDER — TRIPLE ANTIBIOTIC 5-400-5000 EX OINT: 1.0000 "application " | TOPICAL_OINTMENT | Freq: Every day | CUTANEOUS | Status: DC

## 2015-06-28 MED ORDER — PROPOFOL 10 MG/ML IV BOLUS
INTRAVENOUS | Status: DC | PRN
  Administered 2015-06-28: 150 mg via INTRAVENOUS

## 2015-06-28 MED ORDER — SODIUM CHLORIDE 0.9% FLUSH: 3.0000 mL | INTRAVENOUS | Status: DC | PRN

## 2015-06-28 MED ORDER — PENTAFLUOROPROP-TETRAFLUOROETH EX AERO: 1.0000 "application " | INHALATION_SPRAY | CUTANEOUS | Status: DC | PRN

## 2015-06-28 MED ORDER — VITAMIN E 180 MG (400 UNIT) PO CAPS
400.0000 [IU] | ORAL_CAPSULE | Freq: Every day | ORAL | Status: DC
  Administered 2015-06-29 – 2015-07-02 (×4): 400 [IU] via ORAL
  Filled 2015-06-28 (×10): qty 1

## 2015-06-28 MED ORDER — HYDROMORPHONE HCL 1 MG/ML IJ SOLN
0.2500 mg | INTRAMUSCULAR | Status: DC | PRN
  Administered 2015-06-28 (×4): 0.5 mg via INTRAVENOUS

## 2015-06-28 MED ORDER — METOPROLOL TARTRATE 1 MG/ML IV SOLN: 2.0000 mg | INTRAVENOUS | Status: DC | PRN

## 2015-06-28 MED ORDER — DILTIAZEM HCL ER COATED BEADS 120 MG PO CP24
120.0000 mg | ORAL_CAPSULE | Freq: Every day | ORAL | Status: DC
  Administered 2015-06-28 – 2015-07-02 (×5): 120 mg via ORAL
  Filled 2015-06-28 (×5): qty 1

## 2015-06-28 MED ORDER — PHENOL 1.4 % MT LIQD: 1.0000 | OROMUCOSAL | Status: DC | PRN

## 2015-06-28 MED ORDER — LABETALOL HCL 5 MG/ML IV SOLN: 10.0000 mg | INTRAVENOUS | Status: DC | PRN

## 2015-06-28 MED ORDER — DIPHENHYDRAMINE HCL 25 MG PO CAPS
25.0000 mg | ORAL_CAPSULE | Freq: Four times a day (QID) | ORAL | Status: DC | PRN
  Administered 2015-06-29: 25 mg via ORAL

## 2015-06-28 MED ORDER — MIDAZOLAM HCL 2 MG/2ML IJ SOLN
INTRAMUSCULAR | Status: AC
  Filled 2015-06-28: qty 2

## 2015-06-28 MED ORDER — ONDANSETRON HCL 4 MG/2ML IJ SOLN: 4.0000 mg | Freq: Four times a day (QID) | INTRAMUSCULAR | Status: DC | PRN

## 2015-06-28 MED ORDER — DIAZEPAM 5 MG PO TABS
5.0000 mg | ORAL_TABLET | Freq: Three times a day (TID) | ORAL | Status: DC | PRN
  Administered 2015-06-28: 5 mg via ORAL
  Filled 2015-06-28: qty 1

## 2015-06-28 MED ORDER — DIAZEPAM 5 MG PO TABS
ORAL_TABLET | ORAL | Status: AC
  Filled 2015-06-28: qty 1

## 2015-06-28 MED ORDER — HYDRALAZINE HCL 20 MG/ML IJ SOLN: 5.0000 mg | INTRAMUSCULAR | Status: DC | PRN

## 2015-06-28 MED ORDER — ALUM & MAG HYDROXIDE-SIMETH 200-200-20 MG/5ML PO SUSP: 15.0000 mL | ORAL | Status: DC | PRN

## 2015-06-28 MED ORDER — SODIUM CHLORIDE 0.9 % IV SOLN: 250.0000 mL | INTRAVENOUS | Status: DC | PRN

## 2015-06-28 MED ORDER — ACETAMINOPHEN 650 MG RE SUPP: 325.0000 mg | RECTAL | Status: DC | PRN

## 2015-06-28 MED ORDER — BISACODYL 10 MG RE SUPP: 10.0000 mg | Freq: Every day | RECTAL | Status: DC | PRN

## 2015-06-28 MED ORDER — ONDANSETRON HCL 4 MG/2ML IJ SOLN
INTRAMUSCULAR | Status: AC
  Filled 2015-06-28: qty 2

## 2015-06-28 MED ORDER — ACETAMINOPHEN 325 MG PO TABS
325.0000 mg | ORAL_TABLET | ORAL | Status: DC | PRN
  Administered 2015-07-01: 650 mg via ORAL
  Filled 2015-06-28: qty 2

## 2015-06-28 MED ORDER — GLIPIZIDE 5 MG PO TABS
5.0000 mg | ORAL_TABLET | Freq: Every day | ORAL | Status: DC
  Administered 2015-06-29 – 2015-07-02 (×4): 5 mg via ORAL
  Filled 2015-06-28 (×6): qty 1

## 2015-06-28 MED ORDER — SODIUM CHLORIDE 0.9 % IV SOLN: 100.0000 mL | INTRAVENOUS | Status: DC | PRN

## 2015-06-28 MED ORDER — SENNOSIDES-DOCUSATE SODIUM 8.6-50 MG PO TABS
1.0000 | ORAL_TABLET | Freq: Every evening | ORAL | Status: DC | PRN
  Administered 2015-07-01: 1 via ORAL
  Filled 2015-06-28 (×2): qty 1

## 2015-06-28 MED ORDER — OXYCODONE HCL 5 MG PO TABS
5.0000 mg | ORAL_TABLET | ORAL | Status: DC | PRN
  Administered 2015-06-28 – 2015-06-30 (×4): 10 mg via ORAL
  Administered 2015-06-30: 5 mg via ORAL
  Administered 2015-07-01 (×2): 10 mg via ORAL
  Filled 2015-06-28 (×5): qty 2
  Filled 2015-06-28: qty 1
  Filled 2015-06-28: qty 2

## 2015-06-28 MED ORDER — CHLORHEXIDINE GLUCONATE CLOTH 2 % EX PADS: 6.0000 | MEDICATED_PAD | Freq: Once | CUTANEOUS | Status: DC

## 2015-06-28 MED ORDER — LIDOCAINE-PRILOCAINE 2.5-2.5 % EX CREA
1.0000 "application " | TOPICAL_CREAM | CUTANEOUS | Status: DC | PRN
  Filled 2015-06-28: qty 5

## 2015-06-28 MED ORDER — FENTANYL CITRATE (PF) 100 MCG/2ML IJ SOLN
INTRAMUSCULAR | Status: DC | PRN
  Administered 2015-06-28 (×2): 25 ug via INTRAVENOUS
  Administered 2015-06-28 (×2): 50 ug via INTRAVENOUS
  Administered 2015-06-28 (×2): 25 ug via INTRAVENOUS

## 2015-06-28 MED ORDER — PROMETHAZINE HCL 25 MG/ML IJ SOLN: 6.2500 mg | INTRAMUSCULAR | Status: DC | PRN

## 2015-06-28 MED ORDER — MAGNESIUM SULFATE 2 GM/50ML IV SOLN
2.0000 g | Freq: Every day | INTRAVENOUS | Status: DC | PRN
  Filled 2015-06-28: qty 50

## 2015-06-28 MED ORDER — POTASSIUM CHLORIDE CRYS ER 20 MEQ PO TBCR: 20.0000 meq | EXTENDED_RELEASE_TABLET | Freq: Every day | ORAL | Status: DC | PRN

## 2015-06-28 MED ORDER — DEXTROSE 5 % IV SOLN
1.5000 g | INTRAVENOUS | Status: AC
  Administered 2015-06-29: 1.5 g via INTRAVENOUS
  Filled 2015-06-28 (×2): qty 1.5

## 2015-06-28 SURGICAL SUPPLY — 48 items
BANDAGE ACE 4X5 VEL STRL LF (GAUZE/BANDAGES/DRESSINGS) ×2 IMPLANT
BANDAGE ELASTIC 6 VELCRO ST LF (GAUZE/BANDAGES/DRESSINGS) ×6 IMPLANT
BANDAGE ESMARK 6X9 LF (GAUZE/BANDAGES/DRESSINGS) ×1 IMPLANT
BLADE SAW RECIP 87.9 MT (BLADE) ×3 IMPLANT
BNDG CMPR 9X6 STRL LF SNTH (GAUZE/BANDAGES/DRESSINGS) ×1
BNDG COHESIVE 6X5 TAN STRL LF (GAUZE/BANDAGES/DRESSINGS) ×3 IMPLANT
BNDG ESMARK 6X9 LF (GAUZE/BANDAGES/DRESSINGS) ×3
BNDG GAUZE ELAST 4 BULKY (GAUZE/BANDAGES/DRESSINGS) ×4 IMPLANT
CANISTER SUCTION 2500CC (MISCELLANEOUS) ×3 IMPLANT
CLIP TI MEDIUM 6 (CLIP) IMPLANT
COVER SURGICAL LIGHT HANDLE (MISCELLANEOUS) ×3 IMPLANT
CUFF TOURNIQUET SINGLE 24IN (TOURNIQUET CUFF) IMPLANT
CUFF TOURNIQUET SINGLE 34IN LL (TOURNIQUET CUFF) IMPLANT
CUFF TOURNIQUET SINGLE 44IN (TOURNIQUET CUFF) IMPLANT
DRAIN CHANNEL 19F RND (DRAIN) IMPLANT
DRAPE ORTHO SPLIT 77X108 STRL (DRAPES) ×6
DRAPE PROXIMA HALF (DRAPES) ×3 IMPLANT
DRAPE SURG ORHT 6 SPLT 77X108 (DRAPES) ×2 IMPLANT
DRAPE U-SHAPE 47X51 STRL (DRAPES) ×3 IMPLANT
DRSG ADAPTIC 3X8 NADH LF (GAUZE/BANDAGES/DRESSINGS) ×3 IMPLANT
ELECT REM PT RETURN 9FT ADLT (ELECTROSURGICAL) ×3
ELECTRODE REM PT RTRN 9FT ADLT (ELECTROSURGICAL) ×1 IMPLANT
EVACUATOR SILICONE 100CC (DRAIN) IMPLANT
GAUZE SPONGE 4X4 12PLY STRL (GAUZE/BANDAGES/DRESSINGS) ×6 IMPLANT
GLOVE BIO SURGEON STRL SZ7.5 (GLOVE) ×3 IMPLANT
GLOVE BIOGEL PI IND STRL 8 (GLOVE) ×1 IMPLANT
GLOVE BIOGEL PI INDICATOR 8 (GLOVE) ×2
GOWN STRL REUS W/ TWL LRG LVL3 (GOWN DISPOSABLE) ×3 IMPLANT
GOWN STRL REUS W/TWL LRG LVL3 (GOWN DISPOSABLE) ×9
KIT BASIN OR (CUSTOM PROCEDURE TRAY) ×3 IMPLANT
KIT ROOM TURNOVER OR (KITS) ×3 IMPLANT
NS IRRIG 1000ML POUR BTL (IV SOLUTION) ×3 IMPLANT
PACK GENERAL/GYN (CUSTOM PROCEDURE TRAY) ×3 IMPLANT
PAD ARMBOARD 7.5X6 YLW CONV (MISCELLANEOUS) ×6 IMPLANT
PADDING CAST COTTON 6X4 STRL (CAST SUPPLIES) ×3 IMPLANT
SPONGE GAUZE 4X4 12PLY STER LF (GAUZE/BANDAGES/DRESSINGS) ×2 IMPLANT
STAPLER VISISTAT (STAPLE) ×3 IMPLANT
STOCKINETTE IMPERVIOUS LG (DRAPES) ×3 IMPLANT
SUT ETHILON 3 0 PS 1 (SUTURE) IMPLANT
SUT SILK 0 TIES 10X30 (SUTURE) ×3 IMPLANT
SUT SILK 2 0 (SUTURE) ×3
SUT SILK 2 0 SH CR/8 (SUTURE) ×3 IMPLANT
SUT SILK 2-0 18XBRD TIE 12 (SUTURE) ×1 IMPLANT
SUT SILK 3 0 (SUTURE) ×3
SUT SILK 3-0 18XBRD TIE 12 (SUTURE) ×1 IMPLANT
SUT VIC AB 2-0 CT1 18 (SUTURE) ×3 IMPLANT
UNDERPAD 30X30 INCONTINENT (UNDERPADS AND DIAPERS) ×3 IMPLANT
WATER STERILE IRR 1000ML POUR (IV SOLUTION) ×3 IMPLANT

## 2015-06-28 NOTE — Consult Note (Signed)
[facility] Renal Consultation Note    Indication for Consultation:  Management of ESRD/hemodialysis; anemia, hypertension/volume and secondary hyperparathyroidism PCP:  HPI: [name] is a 55 y.o. female with ESRD (MWF NW), DM, hx CVA, HTN, afib on chronic coumadin, PVD s/p prior left transmet with nonhealing left heel wound who is admitted following BKA today by Dr. [name].  She had failed previous surgical and  conservative treatment and has been resistant to surgery, but finally conceded.  She is seen in the PACU and complaining of 10/10 pain asking for pain med which the RN is in process of given. She last had HD Monday with a net UF of 0.9 to her EDW. Pre and post BPs are 160 - 170/80s- 90s. She is due for hemodialysis tomorrow.  Past Medical History  Diagnosis Date  . Hypertension   . CVA (cerebral infarction)     right parietal 05/19/2000  . Vaginal bleeding   . Hyperparathyroidism   . Pneumonia   . Stroke ([place])     2002,no residual  . Peripheral vascular disease ([place])   . GERD (gastroesophageal reflux disease)   . Anemia   . DM (diabetes mellitus) type II controlled peripheral vascular disorder ([place])   . Slip/trip w/o falling due to stepping on object, subs July  2014  . ESRD (end stage renal disease) ([place])     dialysis M/W/F  . Constipation   . Complication of anesthesia     Morphine makes her nauseated, states she has small veins  . Atrial fibrillation [facility])    Past Surgical History  Procedure Laterality Date  . Parathyroidectomy with autotransplant  12/07/2010  . Knee surgery Right X 2  . Brachial artery graft      x2 for dialysis  . Av fistula placement Right     upper thigh  . Dental extractions Bilateral   . Dilation and curettage of uterus  1986  . Transmetatarsal amputation Left 07/15/2012    Procedure: TRANSMETATARSAL AMPUTATION;  Surgeon: Angelia Mould, MD;  Location: [facility] OR;  Service: Vascular;  Laterality: Left;  . Leg surgery  Right   . Foot surgery Left     5 TOES AMPUTATED  . Abdominal aortagram N/A 05/27/2012    Procedure: ABDOMINAL Maxcine Ham;  Surgeon: Serafina Mitchell, MD;  Location: [facility] CATH LAB;  Service: Cardiovascular;  Laterality: N/A;  . Lower extremity angiogram Left 05/27/2012    Procedure: LOWER EXTREMITY ANGIOGRAM;  Surgeon: Serafina Mitchell, MD;  Location: [facility] CATH LAB;  Service: Cardiovascular;  Laterality: Left;  lt leg angio  . Tooth extraction  Sep 08, 2014  . Peripheral vascular catheterization N/A 01/11/2015    Procedure: Lower Extremity Angiography;  Surgeon: Serafina Mitchell, MD;  Location: [place] CV LAB;  Service: Cardiovascular;  Laterality: N/A;  . I&d extremity Left 05/05/2015    Procedure: DEBRIDEMENT HEEL WOUND;  Surgeon: Angelia Mould, MD;  Location: [place];  Service: Vascular;  Laterality: Left;  . Application of wound vac Left 05/05/2015    Procedure: APPLICATION OF WOUND VAC;  Surgeon: Angelia Mould, MD;  Location: [facility] OR;  Service: Vascular;  Laterality: Left;   Family History  Problem Relation Age of Onset  . Hypertension Father   . Kidney disease Mother    Social History:  reports that she has never smoked. She has never used smokeless tobacco. She reports that she does not drink alcohol or use illicit drugs. Allergies  Allergen Reactions  . Doxycycline Itching  .  Peroxyl [Hydrogen Peroxide] Hives and Rash   Prior to Admission medications   Medication Sig Start Date End Date Taking? Authorizing Provider  acetaminophen (TYLENOL) 325 MG tablet Take 650 mg by mouth every Monday, Wednesday, and Friday. With dialysis   Yes Historical Provider, MD  B Complex-C-Folic Acid (RENA-VITE RX) 1 MG TABS Take 1 mg by mouth daily. 05/26/15  Yes Historical Provider, MD  CALCIUM-VITAMIN D PO Take 500 mg by mouth 3 (three) times daily with meals.    Yes Historical Provider, MD  clopidogrel (PLAVIX) 75 MG tablet Take 75 mg by mouth daily. 06/16/15  Yes Historical Provider, MD   collagenase (SANTYL) ointment Apply 1 application topically daily. Apply daily to Left Heel  And cover with dry sterile dressing. 06/02/15  Yes Angelia Mould, MD  Cyanocobalamin (VITAMIN B-12 PO) Take 1 tablet by mouth 4 (four) times a week. Non dialysis days. Cain Saupe, Sat and Sunday   Yes Historical Provider, MD  diazepam (VALIUM) 5 MG tablet Take 5 mg by mouth every 8 (eight) hours as needed for muscle spasms.  11/15/14  Yes Historical Provider, MD  diltiazem (CARDIZEM CD) 120 MG 24 hr capsule Take 1 capsule (120 mg total) by mouth daily. 03/27/15  Yes Shanker Kristeen Mans, MD  diphenhydrAMINE (BENADRYL) 25 MG tablet Take 25 mg by mouth every 6 (six) hours as needed (Muscle spasm in feet).   Yes Historical Provider, MD  glipiZIDE (GLUCOTROL) 10 MG tablet Take 0.5 tablets (5 mg total) by mouth daily. 11/23/14  Yes Lance Bosch, NP  neomycin-bacitracin-polymyxin (NEOSPORIN) [phone] ointment Apply 1 application topically daily. Apply to foot   Yes Historical Provider, MD  polyethylene glycol (MIRALAX / GLYCOLAX) packet Take 17 g by mouth daily. Patient taking differently: Take 17 g by mouth daily as needed for mild constipation.  08/19/14  Yes Reyne Dumas, MD  vitamin C (ASCORBIC ACID) 500 MG tablet Take 500 mg by mouth daily.   Yes Historical Provider, MD  vitamin E 400 UNIT capsule Take 400 Units by mouth daily.   Yes Historical Provider, MD  warfarin (COUMADIN) 5 MG tablet Take 1/2 to 1 tablet by mouth daily as directed 06/23/15  Yes Leonie Man, MD  ciprofloxacin (CIPRO) 500 MG tablet Take 1 tablet (500 mg total) by mouth 2 (two) times daily. Patient not taking: Reported on 06/23/2015 05/06/15   Alvia Grove, PA-C  traMADol Veatrice Bourbon) 50 MG tablet Take 1-2 tablets by mouth every 6 hours as needed for foot pain 05/16/15   Sharmon Leyden Nickel, NP   Current Facility-Administered Medications  Medication Dose Route Frequency Provider Last Rate Last Dose  . 0.9 %  sodium chloride infusion    Intravenous Continuous Angelia Mould, MD      . Chlorhexidine Gluconate Cloth 2 % PADS 6 each  6 each Topical Once Angelia Mould, MD      . HYDROmorphone (DILAUDID) 1 MG/ML injection           . HYDROmorphone (DILAUDID) injection 0.25-0.5 mg  0.25-0.5 mg Intravenous Q5 min PRN Suzette Battiest, MD   0.5 mg at 06/28/15 1245  . promethazine (PHENERGAN) injection 6.25-12.5 mg  6.25-12.5 mg Intravenous Q15 min PRN Suzette Battiest, MD       Labs: Basic Metabolic Panel:  Recent Labs Lab 06/28/15 0829  NA 138  K 3.6  CL 96*  CO2 25  GLUCOSE 156*  BUN 22*  CREATININE 6.68*  CALCIUM 8.9   Liver Function Tests:  Recent Labs Lab 06/28/15 0829  AST 20  ALT 15  ALKPHOS 54  BILITOT 0.7  PROT 7.9  ALBUMIN 2.8*   CBC:  Recent Labs Lab 06/28/15 0829  WBC 8.5  HGB 8.8*  HCT 28.7*  MCV 82.9  PLT 419*  CBG:  Recent Labs Lab 06/28/15 0820 06/28/15 1216  GLUCAP 147* 124*   IROS: As per HPI. Limited due to severe pain. Physical Exam: Filed Vitals:   06/28/15 0818 06/28/15 1210 06/28/15 1240  BP: 190/71 111/57 142/65  Pulse: 106 100 97  Temp: 98.4 F (36.9 C) 97.5 F (36.4 C)   TempSrc: Oral    Resp: 18 16 16   Weight: 68.04 kg (150 lb)    SpO2: 100% 100% 100%     General:  Very uncomfortable AAF Head:  NCAT mouth dry, hirsute Neck: Supple. JVD not elevated. Lungs: Clear bilaterally to auscultation anteriorly. Br eathing is unlabored. Heart: RRR with S1 S2.  Abdomen: Soft, non-tender, + Bs Lower extremities: right LE without edema no wounds fungal, thickened nails left BKA wrapped - dressing in tact Neuro: Alert  Dialysis Access: right thigh AVGG  Dialysis Orders: NW MWF 3.5 hours hr 450/A 1.5 EDW 68- recently lowered 2 K 2.25 Ca profile 2 right thigh AVGG heparin 1000 Mircera 200 q 2 weeks - last got 150 on 2/22 tsat 27% 2/22 ferrin 1479 1/25 iPTH 124 Ca/P ok recent hgb 8.3 - steadily declining despite increase in ESAs.   Assessment/Plan: 1.   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  ESRD -  MWF - HD Wednesday - no heparin for now K 3.6 today - start on 3 K bath Wed 3.  Hypertension/volume  - expect BP to be lower here due to pain meds - titrate to new edw 4.  Anemia  - has been steadily declining in outpt setting - in part due to chronic infection but need to r/o blood loss; suspect ferritin has been ^ due to chronic wound - will resume IV Fe/ESA; transfuse prn 5.  Metabolic bone disease -  Not on VDRA/binders when eating  6.  Nutrition - needs renal/carb mod diet/vit/nepro 7. AFib 8. MRSA +  Myriam Jacobson, PA-C [place] [phone] 06/28/2015, 12:50 PM   Pt seen, examined and agree w A/P as above.  Kelly Splinter MD [employer] pager [phone]    cell [phone] 06/28/2015, 4:50 PM

## 2015-06-28 NOTE — Progress Notes (Signed)
Admission note:  Arrival Method: Bed from PACU Mental Orientation:A&OX3 Telemetry: N/A Assessment: see flowsheet Skin: dry; intact; R BKA IV: L shoulder  Pain: 10/10 Tubes: N/A Safety Measures: Bed in lowest position, call light within reach, bed alarm activated Admission Screening: Pt unable to answer admission questions at this time. 6700 Orientation: Patient has been oriented to the unit, staff and to the room.  Orders have been reviewed and implemented. Report given to charge nurse for oncoming RN.  Gavin Potters

## 2015-06-28 NOTE — H&P (View-Only) (Signed)
Vascular and Vein Specialist of [facility]  Patient name: [name] MRN: [id number] DOB: 1960-11-19 Sex: female  REASON FOR VISIT: Nonhealing left heel wound.  HPI: [name] is a 55 y.o. female, who was last seen in our office on 06/01/2015. She has an extensive wound on the left heel. She has undergone previous atherectomy and angioplasty of the left anterior tibial artery and left popliteal artery with a drug-coated balloon by Dr. Trula Slade on 01/11/2015. She had an excellent result from this. However, her left heel wound was quite extensive and I did not think this was salvageable. We have discussed below the knee amputation on multiple occasions and she was against this. Therefore, on 05/05/2015, she underwent excisional debridement of the left heel with placement of a VAC.   She comes in today as she is now agreeable to consider left below the knee amputation. She has had significant pain in the left heel. She last took her Coumadin last night.  Past Medical History  Diagnosis Date  . Hypertension   . CVA (cerebral infarction)     right parietal 05/19/2000  . Vaginal bleeding   . Hyperparathyroidism   . Pneumonia   . Stroke ([place])     2002,no residual  . Peripheral vascular disease ([place])   . GERD (gastroesophageal reflux disease)   . Anemia   . DM (diabetes mellitus) type II controlled peripheral vascular disorder ([place])   . Slip/trip w/o falling due to stepping on object, subs July  2014  . ESRD (end stage renal disease) ([place])     dialysis M/W/F  . Constipation   . Complication of anesthesia     Morphine makes her nauseated, states she has small veins    Family History  Problem Relation Age of Onset  . Hypertension Father   . Kidney disease Mother     SOCIAL HISTORY: Social History   Social History  . Marital Status: Single    Spouse Name: N/A  . Number of Children: N/A  . Years of Education: N/A   Occupational History  . Not on file.   Social History  Main Topics  . Smoking status: Never Smoker   . Smokeless tobacco: Never Used  . Alcohol Use: No  . Drug Use: No  . Sexual Activity: No   Other Topics Concern  . Not on file   Social History Narrative    Allergies  Allergen Reactions  . Doxycycline Itching  . Peroxyl [Hydrogen Peroxide] Hives and Rash    Current Outpatient Prescriptions  Medication Sig Dispense Refill  . acetaminophen (TYLENOL) 325 MG tablet Take 650 mg by mouth every Monday, Wednesday, and Friday. With dialysis    . CALCIUM-VITAMIN D PO Take 500 mg by mouth 3 (three) times daily with meals.     . ciprofloxacin (CIPRO) 500 MG tablet Take 1 tablet (500 mg total) by mouth 2 (two) times daily. (Patient taking differently: Take 500 mg by mouth daily. ) 28 tablet 0  . Clopidogrel Bisulfate (PLAVIX PO) Take 75 mg by mouth daily.     . collagenase (SANTYL) ointment Apply 1 application topically daily. Apply daily to Left Heel  And cover with dry sterile dressing. 30 g 1  . Cyanocobalamin (VITAMIN B-12 PO) Take 1 tablet by mouth 4 (four) times a week. Non dialysis days. Tues, Thurs, Sat and Sunday    . diazepam (VALIUM) 5 MG tablet Take 5 mg by mouth every 8 (eight) hours as needed for muscle spasms.  0  . diltiazem (CARDIZEM CD) 120 MG 24 hr capsule Take 1 capsule (120 mg total) by mouth daily. 60 capsule 0  . diphenhydrAMINE (BENADRYL) 25 MG tablet Take 25 mg by mouth every 6 (six) hours as needed (Muscle spasm in feet).    Marland Kitchen glipiZIDE (GLUCOTROL) 10 MG tablet Take 0.5 tablets (5 mg total) by mouth daily. 45 tablet 3  . multivitamin (RENA-VIT) TABS tablet Take 1 tablet by mouth daily.    Marland Kitchen neomycin-bacitracin-polymyxin (NEOSPORIN) [phone] ointment Apply 1 application topically daily. Apply to foot    . polyethylene glycol (MIRALAX / GLYCOLAX) packet Take 17 g by mouth daily. (Patient taking differently: Take 17 g by mouth daily as needed for mild constipation. ) 14 each 0  . traMADol (ULTRAM) 50 MG tablet Take 1-2  tablets by mouth every 6 hours as needed for foot pain 50 tablet 0  . vitamin C (ASCORBIC ACID) 500 MG tablet Take 500 mg by mouth daily.    . vitamin E 400 UNIT capsule Take 400 Units by mouth daily.    Marland Kitchen warfarin (COUMADIN) 5 MG tablet Take 1.5 tablets (7.5 mg total) by mouth daily at 6 PM. 60 tablet 0   No current facility-administered medications for this visit.    REVIEW OF SYSTEMS:  [X]  denotes positive finding, [ ]  denotes negative finding Cardiac  Comments:  Chest pain or chest pressure:    Shortness of breath upon exertion:    Short of breath when lying flat:    Irregular heart rhythm:        Vascular    Pain in calf, thigh, or hip brought on by ambulation:    Pain in feet at night that wakes you up from your sleep:     Blood clot in your veins:    Leg swelling:         Pulmonary    Oxygen at home:    Productive cough:     Wheezing:         Neurologic    Sudden weakness in arms or legs:     Sudden numbness in arms or legs:     Sudden onset of difficulty speaking or slurred speech:    Temporary loss of vision in one eye:     Problems with dizziness:         Gastrointestinal    Blood in stool:     Vomited blood:         Genitourinary    Burning when urinating:     Blood in urine:        Psychiatric    Major depression:         Hematologic    Bleeding problems:    Problems with blood clotting too easily:        Skin    Rashes or ulcers:        Constitutional    Fever or chills:      PHYSICAL EXAM: Filed Vitals:   06/22/15 1510 06/22/15 1515  BP: 139/75 146/74  Pulse: 110 110  Temp:  100 F (37.8 C)  TempSrc:  Oral  Resp:  18  Height:  5\' 4"  (1.626 m)  Weight:  150 lb (68.04 kg)  SpO2:  99%    GENERAL: The patient is a well-nourished female, in no acute distress. The vital signs are documented above. CARDIAC: There is a regular rate and rhythm.  VASCULAR: I do not detect carotid bruits. She has palpable femoral pulses. I  cannot palpate  pulses in the left foot or the right foot. PULMONARY: There is good air exchange bilaterally without wheezing or rales. ABDOMEN: Soft and non-tender with normal pitched bowel sounds.  MUSCULOSKELETAL: There are no major deformities or cyanosis. NEUROLOGIC: No focal weakness or paresthesias are detected. SKIN: She has an extensive wound on the left heel with exposed bone. There is dry gangrene of the heel. PSYCHIATRIC: The patient has a normal affect.   MEDICAL ISSUES:  GANGRENE LEFT HEEL: The patient is now agreeable to proceed with a left below the knee amputation. We have discussed the indications for the procedure and the risks including a 10% of nonhealing. She is very concerned about being able to continue to live independently and I think she is a good candidate for rehabilitation and obtaining a prosthesis to try to get her home again safely. We will stop her Coumadin and plan on surgery on 06/28/2015.  HYPERTENSION: The patient's initial blood pressure today was elevated. We repeated this and this was still elevated. We have encouraged the patient to follow up with their primary care physician for management of their blood pressure.   Deitra Mayo [facility] [place]: [phone]

## 2015-06-28 NOTE — Anesthesia Preprocedure Evaluation (Addendum)
Anesthesia Evaluation  Patient identified by MRN, date of birth, ID band Patient awake    Reviewed: Allergy & Precautions, H&P , NPO status , Patient's Chart, lab work & pertinent test results  History of Anesthesia Complications (+) DIFFICULT IV STICK / SPECIAL LINE  Airway Mallampati: II  TM Distance: >3 FB Neck ROM: Full    Dental no notable dental hx. (+) Partial Lower, Partial Upper, Dental Advisory Given   Pulmonary neg pulmonary ROS,    Pulmonary exam normal breath sounds clear to auscultation       Cardiovascular hypertension, Pt. on medications + Peripheral Vascular Disease  negative cardio ROS   Rhythm:Regular Rate:Normal  ECHO: 03/26/2015 - Left ventricle: The cavity size was normal. There was moderateconcentric hypertrophy. Systolic function was vigorous. Theestimated ejection fraction was in the range of 65% to 70%. Wallmotion was normal; there were no regional wall motionabnormalities. Doppler parameters are consistent with abnormalleft ventricular relaxation (grade 1 diastolic dysfunction).Doppler parameters are consistent with high ventricular filling pressure. - Mitral valve: Calcified annulus. - Left atrium: The atrium was mildly dilated.  MV 03/26/2015 IMPRESSION: 1. No reversible ischemia or infarction. 2. Septal dyskinesia. Otherwise normal left ventricular wall motion 3. Left ventricular ejection fraction 55% 4. Low-risk stress test findings*.   Neuro/Psych CVA negative psych ROS   GI/Hepatic Neg liver ROS, GERD  Medicated and Controlled,  Endo/Other  diabetes, Type 2, Oral Hypoglycemic Agents  Renal/GU ESRF and DialysisRenal disease  negative genitourinary   Musculoskeletal   Abdominal   Peds  Hematology negative hematology ROS (+)   Anesthesia Other Findings   Reproductive/Obstetrics negative OB ROS                            Lab Results   Component Value Date   WBC 8.5 06/28/2015   HGB 8.8* 06/28/2015   HCT 28.7* 06/28/2015   MCV 82.9 06/28/2015   PLT 419* 06/28/2015   Lab Results  Component Value Date   CREATININE 6.68* 06/28/2015   BUN 22* 06/28/2015   NA 138 06/28/2015   K 3.6 06/28/2015   CL 96* 06/28/2015   CO2 25 06/28/2015   Lab Results  Component Value Date   INR 1.32 06/28/2015   INR 1.9 06/16/2015   INR 1.8* 06/15/2015    Anesthesia Physical  Anesthesia Plan  ASA: III  Anesthesia Plan: General   Post-op Pain Management:    Induction: Intravenous  Airway Management Planned: LMA  Additional Equipment:   Intra-op Plan:   Post-operative Plan: Extubation in OR  Informed Consent: I have reviewed the patients History and Physical, chart, labs and discussed the procedure including the risks, benefits and alternatives for the proposed anesthesia with the patient or authorized representative who has indicated his/her understanding and acceptance.   Dental advisory given  Plan Discussed with: CRNA  Anesthesia Plan Comments:         Anesthesia Quick Evaluation

## 2015-06-28 NOTE — Anesthesia Postprocedure Evaluation (Signed)
Anesthesia Post Note  Patient: [name]  Procedure(s) Performed: Procedure(s) (LRB):  Left BELOW KNEE amputation (Left)  Patient location during evaluation: PACU Anesthesia Type: General Level of consciousness: awake and alert Pain management: pain level controlled Vital Signs Assessment: post-procedure vital signs reviewed and stable Respiratory status: spontaneous breathing, nonlabored ventilation, respiratory function stable and patient connected to nasal cannula oxygen Cardiovascular status: blood pressure returned to baseline and stable Postop Assessment: no signs of nausea or vomiting Anesthetic complications: no    Last Vitals:  Filed Vitals:   06/28/15 1340 06/28/15 1410  BP: 150/73 126/64  Pulse: 98 98  Temp:    Resp: 16 16    Last Pain:  Filed Vitals:   06/28/15 1435  PainSc: 10-Worst pain ever                 Tiajuana Amass

## 2015-06-28 NOTE — Anesthesia Procedure Notes (Signed)
Procedure Name: LMA Insertion Date/Time: 06/28/2015 10:55 AM Performed by: Salli Quarry Tacora Athanas Pre-anesthesia Checklist: Patient identified, Emergency Drugs available, Suction available and Patient being monitored Patient Re-evaluated:Patient Re-evaluated prior to inductionOxygen Delivery Method: Circle system utilized Preoxygenation: Pre-oxygenation with 100% oxygen Intubation Type: IV induction Ventilation: Mask ventilation without difficulty LMA: LMA inserted LMA Size: 4.0 Number of attempts: 1 Placement Confirmation: positive ETCO2 and breath sounds checked- equal and bilateral Tube secured with: Tape Dental Injury: Teeth and Oropharynx as per pre-operative assessment

## 2015-06-28 NOTE — Op Note (Signed)
    NAME: [name]   MRN: [id number] DOB: 05-17-1960    DATE OF OPERATION: 06/28/2015  PREOP DIAGNOSIS: Gangrene left heel  POSTOP DIAGNOSIS: Same  PROCEDURE: Left below the knee amputation  SURGEON: Judeth Cornfield. [name], MD, FACS  ASSIST: Gerri Lins PA  ANESTHESIA: Gen.   EBL: 100 cc  INDICATIONS: [name] is a 55 y.o. female who presented with a nonhealing wound of her left heel with exposed bone. She failed conservative measures and presents for primary below the knee amputation.  FINDINGS: The muscle was well perfused.  TECHNIQUE: The patient was taken to the operating room and received a general anesthetic. The left lower extremity was prepped and draped in usual sterile fashion. The circumference of the limb was measured 10 cm distal to the tibial tuberosity and two thirds of this distance was used to mark the anterior skin incision. A long posterior flap of equal length was marked. A tourniquet had been placed on the upper thigh. The leg was exsanguinated with an Esmarch bandage and the tourniquet inflated to 300 mmHg. Under tourniquet control, the incision was carried down to the skin, subcutaneous T and his tissue, fascia and muscle to the tibia and fibula which were dissected free circumferentially. The tourniquet was not fully effective and therefore the arteries and veins were individually clamped and divided. These were suture ligated with 2-0 silk ties. The periosteum was elevated on the tibia and the anterior aspect of the tibia was beveled. The tibia was divided. The fibula was then divided also. The leg was removed. The tourniquet was released and additional hemostasis obtained using 2-0 silk ties and electrocautery. The wound was irrigated with cups months of saline after the edges of the bone were rasped. The fascial layer was closed with interrupted 2-0 Vicryl. The skin was closed with staples. A sterile dressing was applied. The patient tolerated the  procedure well and transferred to the recovery room in stable condition. All needle and sponge counts were correct.  Deitra Mayo, MD, FACS [facility] Specialists of [facility]  DATE OF DICTATION:   06/28/2015

## 2015-06-28 NOTE — Transfer of Care (Signed)
Immediate Anesthesia Transfer of Care Note  Patient: [name]  Procedure(s) Performed: Procedure(s):  Left BELOW KNEE amputation (Left)  Patient Location: PACU  Anesthesia Type:General  Level of Consciousness: awake, alert  and patient cooperative  Airway & Oxygen Therapy: Patient Spontanous Breathing and Patient connected to nasal cannula oxygen  Post-op Assessment: Report given to RN and Post -op Vital signs reviewed and stable  Post vital signs: Reviewed and stable  Last Vitals:  Filed Vitals:   06/28/15 0818  BP: 190/71  Pulse: 106  Temp: 36.9 C  Resp: 18    Complications: No apparent anesthesia complications

## 2015-06-28 NOTE — Interval H&P Note (Signed)
History and Physical Interval Note:  06/28/2015 10:08 AM  [name]  has presented today for surgery, with the diagnosis of Nonhealing wound left heel L97.409  The various methods of treatment have been discussed with the patient and family. After consideration of risks, benefits and other options for treatment, the patient has consented to  Procedure(s): AMPUTATION BELOW KNEE (Left) as a surgical intervention .  The patient's history has been reviewed, patient examined, no change in status, stable for surgery.  I have reviewed the patient's chart and labs.  Questions were answered to the patient's satisfaction.     Deitra Mayo

## 2015-06-28 NOTE — Progress Notes (Signed)
ANTICOAGULATION CONSULT NOTE - Initial Consult  Pharmacy Consult for warfarin Indication: atrial fibrillation and stroke  Allergies  Allergen Reactions  . Doxycycline Itching  . Peroxyl [Hydrogen Peroxide] Hives and Rash    Patient Measurements: Weight: 150 lb (68.04 kg)  Vital Signs: Temp: 97.9 F (36.6 C) (03/07 1518) Temp Source: Oral (03/07 1518) BP: 142/61 mmHg (03/07 1518) Pulse Rate: 100 (03/07 1518)  Labs:  Recent Labs  06/28/15 0829  HGB 8.8*  HCT 28.7*  PLT 419*  APTT 41*  LABPROT 16.5*  INR 1.32  CREATININE 6.68*    Estimated Creatinine Clearance: 9.3 mL/min (by C-G formula based on Cr of 6.68).   Medical History: Past Medical History  Diagnosis Date  . Hypertension   . CVA (cerebral infarction)     right parietal 05/19/2000  . Vaginal bleeding   . Hyperparathyroidism   . Pneumonia   . Stroke ([place])     2002,no residual  . Peripheral vascular disease ([place])   . GERD (gastroesophageal reflux disease)   . Anemia   . DM (diabetes mellitus) type II controlled peripheral vascular disorder ([place])   . Slip/trip w/o falling due to stepping on object, subs July  2014  . ESRD (end stage renal disease) ([place])     dialysis M/W/F  . Constipation   . Complication of anesthesia     Morphine makes her nauseated, states she has small veins  . Atrial fibrillation ([facility])     Medications:  Prescriptions prior to admission  Medication Sig Dispense Refill Last Dose  . acetaminophen (TYLENOL) 325 MG tablet Take 650 mg by mouth every Monday, Wednesday, and Friday. With dialysis   06/27/2015 at Unknown time  . B Complex-C-Folic Acid (RENA-VITE RX) 1 MG TABS Take 1 mg by mouth daily.  1 06/27/2015 at Unknown time  . CALCIUM-VITAMIN D PO Take 500 mg by mouth 3 (three) times daily with meals.    Past Week at Unknown time  . clopidogrel (PLAVIX) 75 MG tablet Take 75 mg by mouth daily.  0 06/22/2015  . collagenase (SANTYL) ointment Apply 1 application topically daily. Apply  daily to Left Heel  And cover with dry sterile dressing. 30 g 1 06/27/2015 at Unknown time  . Cyanocobalamin (VITAMIN B-12 PO) Take 1 tablet by mouth 4 (four) times a week. Non dialysis days. Tues, Thurs, Sat and Sunday   Past Week at Unknown time  . diazepam (VALIUM) 5 MG tablet Take 5 mg by mouth every 8 (eight) hours as needed for muscle spasms.   0 06/27/2015 at Unknown time  . diltiazem (CARDIZEM CD) 120 MG 24 hr capsule Take 1 capsule (120 mg total) by mouth daily. 60 capsule 0 06/27/2015 at Unknown time  . diphenhydrAMINE (BENADRYL) 25 MG tablet Take 25 mg by mouth every 6 (six) hours as needed (Muscle spasm in feet).   Past Month at Unknown time  . glipiZIDE (GLUCOTROL) 10 MG tablet Take 0.5 tablets (5 mg total) by mouth daily. 45 tablet 3 06/27/2015 at Unknown time  . neomycin-bacitracin-polymyxin (NEOSPORIN) [phone] ointment Apply 1 application topically daily. Apply to foot   Past Week at Unknown time  . polyethylene glycol (MIRALAX / GLYCOLAX) packet Take 17 g by mouth daily. (Patient taking differently: Take 17 g by mouth daily as needed for mild constipation. ) 14 each 0 Past Week at Unknown time  . vitamin C (ASCORBIC ACID) 500 MG tablet Take 500 mg by mouth daily.   Past Week at Unknown time  .  vitamin E 400 UNIT capsule Take 400 Units by mouth daily.   06/27/2015 at Unknown time  . warfarin (COUMADIN) 5 MG tablet Take 1/2 to 1 tablet by mouth daily as directed 30 tablet 3 Past Week at Unknown time  . ciprofloxacin (CIPRO) 500 MG tablet Take 1 tablet (500 mg total) by mouth 2 (two) times daily. (Patient not taking: Reported on 06/23/2015) 28 tablet 0 Not Taking at Unknown time  . traMADol (ULTRAM) 50 MG tablet Take 1-2 tablets by mouth every 6 hours as needed for foot pain 50 tablet 0 Unknown at Unknown time    Assessment: 55 y/o female with ESRD on HD MWF admitted s/p L BKA. She takes chronic warfarin for hx Afib and stroke. INR is below goal at 1.32 as expected. Pharmacy resuming warfarin  tonight. Hb low at 8.8, platelets are normal. No bleeding noted.  PTA regimen per clinic note: 2.5 mg daily except 5 mg T/T/Su  Goal of Therapy:  INR 2-3 Monitor platelets by anticoagulation protocol: Yes   Plan:  - Warfarin 6 mg PO tonight - Daily INR - Monitor for s/sx of bleeding  [facility], Pharm.D., BCPS Clinical Pharmacist Pager: [phone] 06/28/2015 3:41 PM

## 2015-06-29 ENCOUNTER — Encounter (HOSPITAL_COMMUNITY): Payer: Self-pay | Admitting: Vascular Surgery

## 2015-06-29 DIAGNOSIS — D638 Anemia in other chronic diseases classified elsewhere: Secondary | ICD-10-CM

## 2015-06-29 DIAGNOSIS — Z89512 Acquired absence of left leg below knee: Secondary | ICD-10-CM

## 2015-06-29 DIAGNOSIS — E1142 Type 2 diabetes mellitus with diabetic polyneuropathy: Secondary | ICD-10-CM | POA: Insufficient documentation

## 2015-06-29 DIAGNOSIS — D62 Acute posthemorrhagic anemia: Secondary | ICD-10-CM

## 2015-06-29 DIAGNOSIS — R6510 Systemic inflammatory response syndrome (SIRS) of non-infectious origin without acute organ dysfunction: Secondary | ICD-10-CM

## 2015-06-29 DIAGNOSIS — N186 End stage renal disease: Secondary | ICD-10-CM | POA: Insufficient documentation

## 2015-06-29 DIAGNOSIS — Z89519 Acquired absence of unspecified leg below knee: Secondary | ICD-10-CM | POA: Insufficient documentation

## 2015-06-29 DIAGNOSIS — I739 Peripheral vascular disease, unspecified: Secondary | ICD-10-CM

## 2015-06-29 DIAGNOSIS — I480 Paroxysmal atrial fibrillation: Secondary | ICD-10-CM | POA: Insufficient documentation

## 2015-06-29 DIAGNOSIS — I10 Essential (primary) hypertension: Secondary | ICD-10-CM | POA: Insufficient documentation

## 2015-06-29 DIAGNOSIS — G8918 Other acute postprocedural pain: Secondary | ICD-10-CM

## 2015-06-29 DIAGNOSIS — Z992 Dependence on renal dialysis: Secondary | ICD-10-CM

## 2015-06-29 DIAGNOSIS — Z8673 Personal history of transient ischemic attack (TIA), and cerebral infarction without residual deficits: Secondary | ICD-10-CM | POA: Insufficient documentation

## 2015-06-29 DIAGNOSIS — R269 Unspecified abnormalities of gait and mobility: Secondary | ICD-10-CM | POA: Insufficient documentation

## 2015-06-29 LAB — RENAL FUNCTION PANEL
ALBUMIN: 2.6 g/dL — AB (ref 3.5–5.0)
Anion gap: 16 — ABNORMAL HIGH (ref 5–15)
BUN: 30 mg/dL — ABNORMAL HIGH (ref 6–20)
CALCIUM: 8.4 mg/dL — AB (ref 8.9–10.3)
CO2: 23 mmol/L (ref 22–32)
CREATININE: 8.81 mg/dL — AB (ref 0.44–1.00)
Chloride: 98 mmol/L — ABNORMAL LOW (ref 101–111)
GFR, EST AFRICAN AMERICAN: 5 mL/min — AB (ref >60)
GFR, EST NON AFRICAN AMERICAN: 5 mL/min — AB (ref >60)
Glucose, Bld: 159 mg/dL — ABNORMAL HIGH (ref 65–99)
PHOSPHORUS: 5.5 mg/dL — AB (ref 2.5–4.6)
Potassium: 4.4 mmol/L (ref 3.5–5.1)
SODIUM: 137 mmol/L (ref 135–145)

## 2015-06-29 LAB — CBC
HEMATOCRIT: 25.4 % — AB (ref 36.0–46.0)
HEMOGLOBIN: 7.8 g/dL — AB (ref 12.0–15.0)
MCH: 25.7 pg — ABNORMAL LOW (ref 26.0–34.0)
MCHC: 30.7 g/dL (ref 30.0–36.0)
MCV: 83.6 fL (ref 78.0–100.0)
Platelets: 375 10*3/uL (ref 150–400)
RBC: 3.04 MIL/uL — ABNORMAL LOW (ref 3.87–5.11)
RDW: 18.1 % — ABNORMAL HIGH (ref 11.5–15.5)
WBC: 10.8 10*3/uL — AB (ref 4.0–10.5)

## 2015-06-29 LAB — HEMOGLOBIN A1C
Hgb A1c MFr Bld: 7.7 % — ABNORMAL HIGH (ref 4.8–5.6)
Mean Plasma Glucose: 174 mg/dL

## 2015-06-29 LAB — PROTIME-INR
INR: 1.34 (ref 0.00–1.49)
Prothrombin Time: 16.7 seconds — ABNORMAL HIGH (ref 11.6–15.2)

## 2015-06-29 MED ORDER — DIPHENHYDRAMINE HCL 25 MG PO CAPS
ORAL_CAPSULE | ORAL | Status: AC
  Filled 2015-06-29: qty 1

## 2015-06-29 MED ORDER — MUPIROCIN 2 % EX OINT
1.0000 "application " | TOPICAL_OINTMENT | Freq: Two times a day (BID) | CUTANEOUS | Status: DC
  Administered 2015-06-29 – 2015-07-02 (×7): 1 via NASAL
  Filled 2015-06-29 (×3): qty 22

## 2015-06-29 MED ORDER — DARBEPOETIN ALFA 200 MCG/0.4ML IJ SOSY
PREFILLED_SYRINGE | INTRAMUSCULAR | Status: AC
  Administered 2015-06-29: 200 ug via INTRAVENOUS
  Filled 2015-06-29: qty 0.4

## 2015-06-29 MED ORDER — WARFARIN SODIUM 5 MG PO TABS
5.0000 mg | ORAL_TABLET | Freq: Once | ORAL | Status: AC
  Administered 2015-06-29: 5 mg via ORAL
  Filled 2015-06-29: qty 1

## 2015-06-29 MED ORDER — CHLORHEXIDINE GLUCONATE CLOTH 2 % EX PADS
6.0000 | MEDICATED_PAD | Freq: Every day | CUTANEOUS | Status: DC
  Administered 2015-06-30 – 2015-07-01 (×2): 6 via TOPICAL

## 2015-06-29 NOTE — Progress Notes (Addendum)
Physical Therapy Note   Addendum: Followed up with patient after hemodialysis. Very argumentative about starting physical therapy. Encouraged to participate with minimal mobility training but refuses. Tried to educated patient on Lt knee positioning in full extension and risk of contracture however, does not seem to want to listen to our advice stating "God will be the one who gets me through this." We will attempt to see patient again tomorrow.   Patient currently at hemodialysis. Will follow up for PT evaluation once she returns.    06/29/15 1100  PT Visit Information  Last PT Received On 06/29/15  Reason Eval/Treat Not Completed Patient at procedure or test/unavailable   Elayne Snare, [place]

## 2015-06-29 NOTE — Progress Notes (Signed)
ANTICOAGULATION CONSULT NOTE - Follow Up Consult  Pharmacy Consult for Warfarin Indication: Hx Afib + CVA  Allergies  Allergen Reactions  . Doxycycline Itching  . Peroxyl [Hydrogen Peroxide] Hives and Rash    Patient Measurements: Weight: 148 lb 13 oz (67.5 kg)  Vital Signs: Temp: 99.2 F (37.3 C) (03/08 1341) Temp Source: Oral (03/08 1004) BP: 166/85 mmHg (03/08 1345) Pulse Rate: 119 (03/08 1345)  Labs:  Recent Labs  06/28/15 0829 06/29/15 0720  HGB 8.8* 7.8*  HCT 28.7* 25.4*  PLT 419* 375  APTT 41*  --   LABPROT 16.5* 16.7*  INR 1.32 1.34  CREATININE 6.68* 8.81*    Estimated Creatinine Clearance: 7.1 mL/min (by C-G formula based on Cr of 8.81).   Assessment: 36 YOF with ESRD on HD MWF who presented on 3/7 for a planned L BKA. PTA the patient was on chronic warfarin for hx Afib and stroke however it was held for 1 week prior to the procedure. Pharmacy asked to resume warfarin post-op.   INR this AM remains SUBtherapeutic (INR 1.34, goal of 2-3). Hgb 7.8 << 8.8 likely related to the patient's BKA surgery, plts wnl.   PTA dose was 2.5 mg daily EXCEPT for 5 mg on Sun/Tues/Thurs  Goal of Therapy:  INR 2-3   Plan:  1. Warfarin 5 mg x 1 dose at 1800 today 2. Will continue to monitor for any signs/symptoms of bleeding and will follow up with PT/INR in the a.m.   Alycia Rossetti, PharmD, BCPS Clinical Pharmacist Pager: [phone] 06/29/2015 2:42 PM

## 2015-06-29 NOTE — Progress Notes (Signed)
Dialysis treatment completed.  2000 mL ultrafiltrated and net fluid removal 1500 mL.    Patient status unchanged. Lung sounds clear to ausculation in all fields. No edema. Cardiac: Tachycardic.  Disconnected lines and removed needles.  Pressure held for 10 minutes and band aid/gauze dressing applied.  Report given to bedside RN, Ulice Dash.

## 2015-06-29 NOTE — Progress Notes (Addendum)
Patient arrived to unit per bed.  Reviewed treatment plan and this RN agrees.  Report received from bedside RN, Ulice Dash.  Consent verified.  Patient A & O X 4, drowsy. Lung sounds clear, diminished  to ausculation in all fields. Generalizied edema. Cardiac: Tachycardic.  Prepped R thigh AVG with alcohol and cannulated with two 15 gauge needles.  Pulsation of blood noted.  Flushed access well with saline per protocol.  Connected and secured lines and initiated tx at 1011.  UF goal of 1500 mL and net fluid removal of 1000 mL.  Will continue to monitor.

## 2015-06-29 NOTE — Progress Notes (Signed)
   VASCULAR SURGERY ASSESSMENT & PLAN:  * 1 Day Post-Op s/p: Left BKA  *  Needs to keep knee straight  * Dressing change tomorrow  * Elevating BKA should help with pain  * Appreciate Renal's help with medical issues.  SUBJECTIVE: Moderate pain  PHYSICAL EXAM: Filed Vitals:   06/28/15 1518 06/28/15 1736 06/28/15 2016 06/29/15 0652  BP: 142/61 162/71 155/71 155/62  Pulse: 100 99 102 115  Temp: 97.9 F (36.6 C) 97.8 F (36.6 C) 97.9 F (36.6 C) 101 F (38.3 C)  TempSrc: Oral Oral Oral Oral  Resp: 17 18 18 19   Weight:      SpO2: 100% 100% 100% 100%   Dresssing dry  LABS: Lab Results  Component Value Date   WBC 8.5 06/28/2015   HGB 8.8* 06/28/2015   HCT 28.7* 06/28/2015   MCV 82.9 06/28/2015   PLT 419* 06/28/2015   Lab Results  Component Value Date   CREATININE 6.68* 06/28/2015   Lab Results  Component Value Date   INR 1.32 06/28/2015   CBG (last 3)   Recent Labs  06/28/15 0820 06/28/15 1025 06/28/15 1216  GLUCAP 147* 122* 124*    Active Problems:   PAD (peripheral artery disease) ([place])    [name] [id number] 06/29/2015

## 2015-06-29 NOTE — Progress Notes (Signed)
Pt stated she runs Profile 2 during outpatient treatments.  Nephrologist notified.

## 2015-06-29 NOTE — Consult Note (Addendum)
Physical Medicine and Rehabilitation Consult Reason for Consult: Left below-knee amputation Referring Physician: Dr. Deitra Mayo  HPI: [name] is a 55 y.o. 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 By report patient lives alone used a walker prior to admission. Presented 06/28/2015 with nonhealing left heel wound and no change with conservative care. Status post excisional debridement of left heel placement of wound VAC 05/05/2015. Underwent left below-knee amputation 06/28/2015 per Dr. Deitra Mayo. Hospital course pain management. MRSA PCR screen positive placed on contact precautions. Hemodialysis ongoing as per renal services. Physical and occupational therapy evaluations pending. M.D. has requested physical medicine rehabilitation consult.  Review of Systems  Constitutional: Negative for fever and chills.  HENT: Negative for hearing loss.   Eyes: Negative for blurred vision and double vision.  Respiratory: Negative for cough.        Shortness of breath on exertion  Cardiovascular: Positive for palpitations and leg swelling. Negative for chest pain.  Gastrointestinal: Positive for constipation. Negative for nausea and vomiting.       GERD  Musculoskeletal: Positive for myalgias and joint pain.  Skin: Negative for rash.  Neurological: Positive for weakness and headaches. Negative for seizures.  All other systems reviewed and are negative.  Past Medical History  Diagnosis Date  . Hypertension   . CVA (cerebral infarction)     right parietal 05/19/2000  . Vaginal bleeding   . Hyperparathyroidism   . Pneumonia   . Stroke ([place])     2002,no residual  . Peripheral vascular disease ([place])   . GERD (gastroesophageal reflux disease)   . Anemia     . DM (diabetes mellitus) type II controlled peripheral vascular disorder ([place])   . Slip/trip w/o falling due to stepping on object, subs July  2014  . ESRD (end stage renal disease) ([place])     dialysis M/W/F  . Constipation   . Complication of anesthesia     Morphine makes her nauseated, states she has small veins  . Atrial fibrillation [facility])    Past Surgical History  Procedure Laterality Date  . Parathyroidectomy with autotransplant  12/07/2010  . Knee surgery Right X 2  . Brachial artery graft      x2 for dialysis  . Av fistula placement Right     upper thigh  . Dental extractions Bilateral   . Dilation and curettage of uterus  1986  . Transmetatarsal amputation Left 07/15/2012    Procedure: TRANSMETATARSAL AMPUTATION;  Surgeon: Angelia Mould, MD;  Location: [facility] OR;  Service: Vascular;  Laterality: Left;  . Leg surgery Right   . Foot surgery Left     5 TOES AMPUTATED  . Abdominal aortagram N/A 05/27/2012    Procedure: ABDOMINAL Maxcine Ham;  Surgeon: Serafina Mitchell, MD;  Location: [facility] CATH LAB;  Service: Cardiovascular;  Laterality: N/A;  . Lower extremity angiogram Left 05/27/2012    Procedure: LOWER EXTREMITY ANGIOGRAM;  Surgeon: Serafina Mitchell, MD;  Location: [facility] CATH LAB;  Service: Cardiovascular;  Laterality: Left;  lt leg angio  . Tooth extraction  Sep 08, 2014  . Peripheral vascular catheterization N/A 01/11/2015    Procedure: Lower Extremity Angiography;  Surgeon: Serafina Mitchell, MD;  Location: [place] CV LAB;  Service: Cardiovascular;  Laterality: N/A;  . I&d extremity Left 05/05/2015    Procedure: DEBRIDEMENT HEEL  WOUND;  Surgeon: Angelia Mould, MD;  Location: [place];  Service: Vascular;  Laterality: Left;  . Application of wound vac Left 05/05/2015    Procedure: APPLICATION OF WOUND VAC;  Surgeon: Angelia Mould, MD;  Location: [facility] OR;  Service: Vascular;  Laterality: Left;   Family History  Problem Relation Age of Onset  . Hypertension Father   .  Kidney disease Mother    Social History:  reports that she has never smoked. She has never used smokeless tobacco. She reports that she does not drink alcohol or use illicit drugs. Allergies:  Allergies  Allergen Reactions  . Doxycycline Itching  . Peroxyl [Hydrogen Peroxide] Hives and Rash   Medications Prior to Admission  Medication Sig Dispense Refill  . acetaminophen (TYLENOL) 325 MG tablet Take 650 mg by mouth every Monday, Wednesday, and Friday. With dialysis    . B Complex-C-Folic Acid (RENA-VITE RX) 1 MG TABS Take 1 mg by mouth daily.  1  . CALCIUM-VITAMIN D PO Take 500 mg by mouth 3 (three) times daily with meals.     . clopidogrel (PLAVIX) 75 MG tablet Take 75 mg by mouth daily.  0  . collagenase (SANTYL) ointment Apply 1 application topically daily. Apply daily to Left Heel  And cover with dry sterile dressing. 30 g 1  . Cyanocobalamin (VITAMIN B-12 PO) Take 1 tablet by mouth 4 (four) times a week. Non dialysis days. Tues, Thurs, Sat and Sunday    . diazepam (VALIUM) 5 MG tablet Take 5 mg by mouth every 8 (eight) hours as needed for muscle spasms.   0  . diltiazem (CARDIZEM CD) 120 MG 24 hr capsule Take 1 capsule (120 mg total) by mouth daily. 60 capsule 0  . diphenhydrAMINE (BENADRYL) 25 MG tablet Take 25 mg by mouth every 6 (six) hours as needed (Muscle spasm in feet).    Marland Kitchen glipiZIDE (GLUCOTROL) 10 MG tablet Take 0.5 tablets (5 mg total) by mouth daily. 45 tablet 3  . neomycin-bacitracin-polymyxin (NEOSPORIN) [phone] ointment Apply 1 application topically daily. Apply to foot    . polyethylene glycol (MIRALAX / GLYCOLAX) packet Take 17 g by mouth daily. (Patient taking differently: Take 17 g by mouth daily as needed for mild constipation. ) 14 each 0  . vitamin C (ASCORBIC ACID) 500 MG tablet Take 500 mg by mouth daily.    . vitamin E 400 UNIT capsule Take 400 Units by mouth daily.    Marland Kitchen warfarin (COUMADIN) 5 MG tablet Take 1/2 to 1 tablet by mouth daily as directed 30 tablet  3  . ciprofloxacin (CIPRO) 500 MG tablet Take 1 tablet (500 mg total) by mouth 2 (two) times daily. (Patient not taking: Reported on 06/23/2015) 28 tablet 0  . traMADol (ULTRAM) 50 MG tablet Take 1-2 tablets by mouth every 6 hours as needed for foot pain 50 tablet 0    Home:    Functional History:   Functional Status:  Mobility:          ADL:    Cognition: Cognition Orientation Level: Oriented X4    Blood pressure 155/71, pulse 102, temperature 97.9 F (36.6 C), temperature source Oral, resp. rate 18, weight 68.04 kg (150 lb), SpO2 100 %. Physical Exam  Vitals reviewed. Constitutional: She is oriented to person, place, and time. She appears well-developed and well-nourished.  55 year old female appearing older than stated age  HENT:  Head: Normocephalic and atraumatic.  Eyes: EOM are normal. Scleral icterus is present.  Pupils  reactive to light  Neck: Normal range of motion. Neck supple. No thyromegaly present.  Cardiovascular:  Irregular irregular  Respiratory: Effort normal and breath sounds normal.  GI: Soft. Bowel sounds are normal. She exhibits no distension.  Musculoskeletal: She exhibits edema and tenderness.  Neurological: She is oriented to person, place, and time.  Lethargic but arousable.  She would fall asleep during exam.  Sensation intact to light touch Motor: B/l UE 3+/5 RLE 3-/5 hip flexion, knee extension, ankle dorsi/plantar flexion 3+/5  Skin: Skin is warm and dry.  Amputation site is dressed appropriately tender  Psychiatric: Her affect is blunt. Her speech is delayed. She is slowed.    Results for orders placed or performed during the hospital encounter of 06/28/15 (from the past 24 hour(s))  Glucose, capillary     Status: Abnormal   Collection Time: 06/28/15  8:20 AM  Result Value Ref Range   Glucose-Capillary 147 (H) 65 - 99 mg/dL  Surgical pcr screen     Status: Abnormal   Collection Time: 06/28/15  8:27 AM  Result Value Ref Range    MRSA, PCR NEGATIVE NEGATIVE   Staphylococcus aureus POSITIVE (A) NEGATIVE  APTT     Status: Abnormal   Collection Time: 06/28/15  8:29 AM  Result Value Ref Range   aPTT 41 (H) 24 - 37 seconds  CBC     Status: Abnormal   Collection Time: 06/28/15  8:29 AM  Result Value Ref Range   WBC 8.5 4.0 - 10.5 K/uL   RBC 3.46 (L) 3.87 - 5.11 MIL/uL   Hemoglobin 8.8 (L) 12.0 - 15.0 g/dL   HCT 28.7 (L) 36.0 - 46.0 %   MCV 82.9 78.0 - 100.0 fL   MCH 25.4 (L) 26.0 - 34.0 pg   MCHC 30.7 30.0 - 36.0 g/dL   RDW 17.7 (H) 11.5 - 15.5 %   Platelets 419 (H) 150 - 400 K/uL  Comprehensive metabolic panel     Status: Abnormal   Collection Time: 06/28/15  8:29 AM  Result Value Ref Range   Sodium 138 135 - 145 mmol/L   Potassium 3.6 3.5 - 5.1 mmol/L   Chloride 96 (L) 101 - 111 mmol/L   CO2 25 22 - 32 mmol/L   Glucose, Bld 156 (H) 65 - 99 mg/dL   BUN 22 (H) 6 - 20 mg/dL   Creatinine, Ser 6.68 (H) 0.44 - 1.00 mg/dL   Calcium 8.9 8.9 - 10.3 mg/dL   Total Protein 7.9 6.5 - 8.1 g/dL   Albumin 2.8 (L) 3.5 - 5.0 g/dL   AST 20 15 - 41 U/L   ALT 15 14 - 54 U/L   Alkaline Phosphatase 54 38 - 126 U/L   Total Bilirubin 0.7 0.3 - 1.2 mg/dL   GFR calc non Af Amer 6 (L) >60 mL/min   GFR calc Af Amer 7 (L) >60 mL/min   Anion gap 17 (H) 5 - 15  Protime-INR     Status: Abnormal   Collection Time: 06/28/15  8:29 AM  Result Value Ref Range   Prothrombin Time 16.5 (H) 11.6 - 15.2 seconds   INR 1.32 0.00 - 1.49  Glucose, capillary     Status: Abnormal   Collection Time: 06/28/15 10:25 AM  Result Value Ref Range   Glucose-Capillary 122 (H) 65 - 99 mg/dL  Glucose, capillary     Status: Abnormal   Collection Time: 06/28/15 12:16 PM  Result Value Ref Range   Glucose-Capillary 124 (H) 65 -  99 mg/dL   Comment 1 Notify RN    No results found.  Assessment/Plan: Diagnosis: Left below-knee amputation Labs independently reviewed.  Records reviewed and summated above. Clean amputation daily with soap and water Monitor  incision site for signs of infection or impending skin breakdown. Staples to remain in place for 3-4 weeks Stump shrinker, for edema control  Scar mobilization massaging to prevent soft tissue adherence Stump protector during therapies Prevent flexion contractures by implementing the following:   Encourage prone lying for 20-30 mins per day BID to avoid hip flexion  Contractures if medically appropriate;  Avoid pillow under knees when patient is lying in bed in order to prevent both  knee and hip flexion contractures;  Avoid prolonged sitting Post surgical pain control with oral medication Phantom limb pain control with physical modalities including desensitization techniques (gentle self massage to the residual stump,hot packs if sensation iintact, [place]) and mirror therapy, TENS. If ineffective, consider pharmacological treatment for neuropathic pain (e.g gabapentin, pregabalin, amytriptalyine, duloxetine).  When using wheelchair, patient should have knee on amputated side fully extended with board under the seat cushion. Avoid injury to contralateral side   1. Does the need for close, 24 hr/day medical supervision in concert with the patient's rehab needs make it unreasonable for this patient to be served in a less intensive setting? Yes  2. Co-Morbidities requiring supervision/potential complications: atrial fibrillation (Cont meds, monitor HR with increased activity), right parietal CVA January 2002, diabetes mellitus and peripheral neuropathy (Monitor in accordance with exercise and adjust meds as necessary), end-stage renal disease with hemodialysis (Cont recs per Neprho), post-op pain management (Biofeedback training with therapies to help reduce reliance on opiate pain medications, monitor pain control during therapies, and sedation at rest and titrate to maximum efficacy to ensure participation and gains in therapies), Tachycardia (monitor in accordance with pain and increasing activity), SIRS  (pyrexia with leukocytosis and tachycardia - cont to monitor for signs and symptoms of infection), HTN (monitor and provide prns in accordance with increased physical exertion and pain), acute on chronic anemia (transfuse if necessary to ensure appropriate perfusion for increased activity tolerance) 3. Due to bowel management, safety, skin/wound care, disease management, medication administration, pain management and patient education, does the patient require 24 hr/day rehab nursing? Yes 4. Does the patient require coordinated care of a physician, rehab nurse, PT (1-2 hrs/day, 5 days/week), OT (1-2 hrs/day, 5 days/week) and SLP (1-2 hrs/day, 5 days/week) to address physical and functional deficits in the context of the above medical diagnosis(es)? Yes Addressing deficits in the following areas: balance, endurance, locomotion, strength, transferring, bathing, dressing, grooming, toileting, cognition, speech and psychosocial support 5. Can the patient actively participate in an intensive therapy program of at least 3 hrs of therapy per day at least 5 days per week? Potentially 6. The potential for patient to make measurable gains while on inpatient rehab is excellent 7. Anticipated functional outcomes upon discharge from inpatient rehab are TBD  with PT, TBD with OT, TBD with SLP. 8. Estimated rehab length of stay to reach the above functional goals is: TBD 9. Does the patient have adequate social supports and living environment to accommodate these discharge functional goals? Potentially 10. Anticipated D/C setting: Home 11. Anticipated post D/C treatments: HH therapy and Home excercise program 12. Overall Rehab/Functional Prognosis: excellent  RECOMMENDATIONS: This patient's condition is appropriate for continued rehabilitative care in the following setting: Will need to await formal therapy consults prior to determining most appropriate discharged disposition. We'll also  need patient to be medically  stable, including resolution of SIRS.  Based on current level of functioning, patient will likely need SNF, however this may change with workup/treatment of SIRS. Will continue to follow. Patient has agreed to participate in recommended program. Potentially Note that insurance prior authorization may be required for reimbursement for recommended care.  Comment: Rehab Admissions Coordinator to follow up.  Delice Lesch, MD 06/29/2015

## 2015-06-29 NOTE — Progress Notes (Signed)
[name] Progress Note  Dialysis Orders: NW MWF 3.5 hours hr 450/A 1.5 EDW 68- recently lowered 2 K 2.25 Ca profile 2 right thigh AVGG heparin 1000 Mircera 200 q 2 weeks - last got 150 on 2/22 tsat 27% 2/22 ferrin 1479 1/25 iPTH 124 Ca/P ok recent hgb 8.3 - steadily declining despite increase in ESAs.  Assessment/Plan: 1. 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 ESRD - MWF - HD Wednesday - no heparin for now K 4.4 today - for 3 K bath 3. Hypertension/volume - titrate to new edw- has been resistant to lowering EDW in outpt setting; now that she has BKA we can try to titrate it more accurately - increase goal today to 2.5 - 3 as BP allows 4. Anemia - has been steadily declining in outpt setting - in part due to chronic infection but need to r/o blood loss; suspect ferritin has been ^ due to chronic wound - will resume IV Fe/ESA;  HGb 7.8 post op stable  5. Metabolic bone disease - Not on VDRA- resume binders  6. Nutrition - needs renal/carb mod diet/vit/nepro 7. AFib -resuming coumadin 8. MRSA +  Elizabeth Jacobson, PA-C [place] [phone] 06/29/2015,9:39 AM  LOS: 1 day   Pt seen, examined and agree w A/P as above. Stable after L BKA surgery.  Pain control reasonable. HD today. Titrate dry wt down after amputatation.  Kelly Splinter MD [facility] pager [phone]    cell [phone] 06/29/2015, 1:54 PM    Subjective:   Pain better.   Objective Filed Vitals:   06/28/15 1518 06/28/15 1736 06/28/15 2016 06/29/15 0652  BP: 142/61 162/71 155/71 155/62  Pulse: 100 99 102 115  Temp: 97.9 F (36.6 C) 97.8 F (36.6 C) 97.9 F (36.6 C) 101 F (38.3 C)  TempSrc: Oral Oral Oral Oral  Resp: 17 18 18 19   Weight:      SpO2: 100% 100% 100% 100%   Physical Exam General: drowsy but oriented Heart: tachy reg Lungs: no rales Abdomen: soft NT Extremities: left BKA wrapped -  knee bent- right LE no sig edema Dialysis Access: right thigh AVGG patent  Additional Objective Labs: Lab Results  Component Value Date   INR 1.34 06/29/2015   INR 1.32 [id number]    Basic Metabolic Panel:  Recent Labs Lab 06/28/15 0829 06/29/15 0720  NA 138 137  K 3.6 4.4  CL 96* 98*  CO2 25 23  GLUCOSE 156* 159*  BUN 22* 30*  CREATININE 6.68* 8.81*  CALCIUM 8.9 8.4*  PHOS  --  5.5*   Liver Function Tests:  Recent Labs Lab 06/28/15 0829 06/29/15 0720  AST 20  --   ALT 15  --   ALKPHOS 54  --   BILITOT 0.7  --   PROT 7.9  --   ALBUMIN 2.8* 2.6*  CBC:  Recent Labs Lab 06/28/15 0829 06/29/15 0720  WBC 8.5 10.8*  HGB 8.8* 7.8*  HCT 28.7* 25.4*  MCV 82.9 83.6  PLT 419* 375   CBG:  Recent Labs Lab 06/28/15 0820 06/28/15 1025 06/28/15 1216  GLUCAP 147* 122* 124*   Medications:   . acetaminophen  650 mg Oral Q M,W,F  . cefUROXime (ZINACEF)  IV  1.5 g Intravenous Q24H  . clopidogrel  75 mg Oral Daily  . darbepoetin (ARANESP) injection - DIALYSIS  200 mcg Intravenous Q Wed-HD  . diltiazem  120 mg Oral Daily  . docusate sodium  100 mg Oral Daily  . ferric gluconate (FERRLECIT/NULECIT) IV  125 mg Intravenous Q M,W,F-HD  . glipiZIDE  5 mg Oral QAC breakfast  . multivitamin  1 tablet Oral QHS  . pantoprazole  40 mg Oral Daily  . sodium chloride flush  3 mL Intravenous Q12H  . vitamin C  500 mg Oral Daily  . vitamin E  400 Units Oral Daily  . Warfarin - Pharmacist Dosing Inpatient   Does not apply [phone]

## 2015-06-30 LAB — CBC
HCT: 21.9 % — ABNORMAL LOW (ref 36.0–46.0)
Hemoglobin: 6.9 g/dL — CL (ref 12.0–15.0)
MCH: 25.8 pg — AB (ref 26.0–34.0)
MCHC: 31.5 g/dL (ref 30.0–36.0)
MCV: 82 fL (ref 78.0–100.0)
PLATELETS: 369 10*3/uL (ref 150–400)
RBC: 2.67 MIL/uL — AB (ref 3.87–5.11)
RDW: 18.1 % — ABNORMAL HIGH (ref 11.5–15.5)
WBC: 14.4 10*3/uL — ABNORMAL HIGH (ref 4.0–10.5)

## 2015-06-30 LAB — BASIC METABOLIC PANEL
ANION GAP: 15 (ref 5–15)
BUN: 24 mg/dL — ABNORMAL HIGH (ref 6–20)
CALCIUM: 8.7 mg/dL — AB (ref 8.9–10.3)
CO2: 26 mmol/L (ref 22–32)
CREATININE: 7.64 mg/dL — AB (ref 0.44–1.00)
Chloride: 93 mmol/L — ABNORMAL LOW (ref 101–111)
GFR, EST AFRICAN AMERICAN: 6 mL/min — AB (ref >60)
GFR, EST NON AFRICAN AMERICAN: 5 mL/min — AB (ref >60)
Glucose, Bld: 148 mg/dL — ABNORMAL HIGH (ref 65–99)
Potassium: 3.9 mmol/L (ref 3.5–5.1)
SODIUM: 134 mmol/L — AB (ref 135–145)

## 2015-06-30 LAB — PROTIME-INR
INR: 2 — AB (ref 0.00–1.49)
PROTHROMBIN TIME: 22.6 s — AB (ref 11.6–15.2)

## 2015-06-30 LAB — GLUCOSE, CAPILLARY: Glucose-Capillary: 145 mg/dL — ABNORMAL HIGH (ref 65–99)

## 2015-06-30 MED ORDER — WARFARIN SODIUM 5 MG PO TABS
5.0000 mg | ORAL_TABLET | Freq: Once | ORAL | Status: AC
  Administered 2015-06-30: 5 mg via ORAL
  Filled 2015-06-30 (×3): qty 1

## 2015-06-30 NOTE — Evaluation (Signed)
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 Guarded, requires max VC to participate with therapy. Frequently states she can do everything on her own but does not demonstrate facilitation to to transfer. Required max assist +2 for scoot into recliner. Educated extensively on importance of working with PT to prevent further functional decline and other medical complications associated with being bed ridden. Lives alone, will need SNF. Patient will benefit from skilled PT to increase their independence and safety with mobility to allow discharge to the venue listed below.       Follow Up Recommendations SNF    Equipment Recommendations   (TBD next venue of care)    Recommendations for Other Services       Precautions / Restrictions Precautions Precautions: Fall Restrictions Weight Bearing Restrictions: Yes LLE Weight Bearing: Non weight bearing Other Position/Activity Restrictions: no order for wb status LLE, treated as NWB      Mobility  Bed Mobility Overal bed mobility: Needs Assistance Bed Mobility: Supine to Sit     Supine to sit: Mod assist;HOB elevated     General bed mobility comments: Mod assist to rise to EOB, guarded. Max VC for sequencing and to use UEs to assist. Mod assist for RLE and trunk.  Transfers Overall transfer level: Needs assistance Equipment used: None Transfers: Lateral/Scoot Transfers          Lateral/Scoot Transfers: Max assist;+2 physical assistance General transfer comment: Refused to attempt to stand, and would not use RLE for some reason  despite max VC for technique. Leans posteriorly. Cues for anterior weight shift. Max assist to scoot into recliner. very slow.  Ambulation/Gait                Stairs            Wheelchair Mobility    Modified Rankin (Stroke Patients Only)       Balance Overall balance assessment: Needs assistance Sitting-balance support: Single extremity supported Sitting balance-Leahy Scale: Poor Sitting balance - Comments: BUE support for balance, generalized weakness, declined coming fully to EOB so may do better once pt is agreeable to fully sitting EOB.                                     Pertinent Vitals/Pain Pain Assessment: 0-10 Pain Score: 8  Pain Location: Lt residual limb Pain Descriptors / Indicators: Aching Pain Intervention(s): Monitored during session;Repositioned    Home Living Family/patient expects to be discharged to:: Unsure Living Arrangements: Alone Available Help at Discharge: Personal care attendant (M-F, 3hr at a time) Type of Home: Apartment Home Access: Stairs to enter Entrance Stairs-Rails: Left Entrance Stairs-Number of Steps: 3 Home Layout: One level Home Equipment: Walker - 2 wheels;Walker - 4 wheels;Bedside commode;Shower seat Additional Comments: per previous hospital note: Aide comes 5 days/wk and assists with ADLs, housework, gets her groceries; pt reports aide is only there ~2 hours per day    Prior Function Level of Independence: Needs assistance   Gait / Transfers Assistance Needed: Reports taking  bus to dialysis. Uses RW for ambulation.  ADL's / Homemaking Assistance Needed: states aide assist with bath dress cook clean.        Hand Dominance   Dominant Hand: Right    Extremity/Trunk Assessment   Upper Extremity Assessment: Defer to OT evaluation           Lower Extremity Assessment: LLE deficits/detail   LLE Deficits / Details: limited participation, decreased strength and ROM as expected post op      Communication   Communication: No difficulties  Cognition Arousal/Alertness: Lethargic Behavior During Therapy: Flat affect;Anxious Overall Cognitive Status: Within Functional Limits for tasks assessed (Oriented x4)                      General Comments General comments (skin integrity, edema, etc.): Educated extensively on knee extension for positioning to prevent contracture. Explained importance of working with PT to prevent further functional decline and improve independence.    Exercises        Assessment/Plan    PT Assessment Patient needs continued PT services  PT Diagnosis Difficulty walking;Generalized weakness;Acute pain   PT Problem List Decreased strength;Decreased range of motion;Decreased activity tolerance;Decreased balance;Decreased mobility;Decreased knowledge of use of DME;Decreased safety awareness;Decreased knowledge of precautions;Pain  PT Treatment Interventions DME instruction;Functional mobility training;Therapeutic activities;Therapeutic exercise;Balance training;Neuromuscular re-education;Patient/family education   PT Goals (Current goals can be found in the Care Plan section) Acute Rehab PT Goals Patient Stated Goal: None stated PT Goal Formulation: With patient Time For Goal Achievement: 07/14/15 Potential to Achieve Goals: Fair    Frequency Min 2X/week   Barriers to discharge Decreased caregiver support lives alone    Co-evaluation               End of Session Equipment Utilized During Treatment: Gait belt Activity Tolerance: Other (comment) (Self limiting) Patient left: in chair;with call bell/phone within reach;with chair alarm set Nurse Communication: Mobility status (lateral scoot transfer)         Time: [id number] PT Time Calculation (min) (ACUTE ONLY): 20 min   Charges:   PT Evaluation $PT Eval Moderate Complexity: 1 Procedure     PT G CodesEllouise Newer 06/30/2015, 11:29 AM Elayne Snare,  [place]

## 2015-06-30 NOTE — Progress Notes (Addendum)
  [facility] Progress Note  VASCULAR SURGERY ASSESSMENT AND PLAN:  Her BKA stump is healing well so far. Continue dressing changes. Will need SNF.  Deitra Mayo, MD, FACS [facility] [phone]  06/30/2015 12:12 PM POD 2  Subjective:  Having some soreness with left stump.   Filed Vitals:   06/30/15 0510 06/30/15 0808  BP: 155/60 159/72  Pulse: 111 108  Temp: 99.5 F (37.5 C) 99 F (37.2 C)  Resp: 16 16   Physical Exam: Patient reports that Dr. [name] changed her dressing this am and did not want her dressing taken off again.  Dressing is clean and dry.   CBC    Component Value Date/Time   WBC 10.8* 06/29/2015 0720   RBC 3.04* 06/29/2015 0720   HGB 7.8* 06/29/2015 0720   HCT 25.4* 06/29/2015 0720   PLT 375 06/29/2015 0720   MCV 83.6 06/29/2015 0720   MCH 25.7* 06/29/2015 0720   MCHC 30.7 06/29/2015 0720   RDW 18.1* 06/29/2015 0720   LYMPHSABS 1.5 03/25/2015 1125   MONOABS 0.8 03/25/2015 1125   EOSABS 0.1 03/25/2015 1125   BASOSABS 0.0 03/25/2015 1125    BMET    Component Value Date/Time   NA 137 06/29/2015 0720   K 4.4 06/29/2015 0720   CL 98* 06/29/2015 0720   CO2 23 06/29/2015 0720   GLUCOSE 159* 06/29/2015 0720   BUN 30* 06/29/2015 0720   CREATININE 8.81* 06/29/2015 0720   CALCIUM 8.4* 06/29/2015 0720   GFRNONAA 5* 06/29/2015 0720   GFRAA 5* 06/29/2015 0720    INR    Component Value Date/Time   INR 1.34 06/29/2015 0720   INR 1.9 06/16/2015     Intake/Output Summary (Last 24 hours) at 06/30/15 1212 Last data filed at 06/30/15 [id number]  Gross per 24 hour  Intake      0 ml  Output   1500 ml  Net  -1500 ml     Assessment/Plan:  55 y.o. female is s/p left below knee amputation  POD 2  Dressing change per Dr. [name] this am.  PT recommending SNF.  Coumadin restarted today.   Virgina Jock, PA-C [facility] Office: [phone] Pager: [phone] 06/30/2015 12:12 PM

## 2015-06-30 NOTE — Progress Notes (Signed)
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 Pt will benefit from continued acute OT to address the below listed deficits and maximize independence with BADLs prior to d/c to venue below. PTA pt lived alone with some assistance from a [facility] aide (per last hospital admission therapy note) and ambulated with rw. Pt is currently mod A with bed mobility, likely +2 for transfers. Pt needing max encouragement for minimal participation in therapy this session. Pt reporting 10/10 pain and lethargic. Pt came mostly to EOB this session and sat for 3-4 minutes. Pt will benefit from further therapy in a skilled care setting. Encouraged pt to work on mobility each opportunity she is given and heavily educated on keeping left knee straight. Pt with left knee in flexed position resting on right leg upon therapist arrival. OT to continue to follow acutely.      Follow Up Recommendations  CIR    Equipment Recommendations  Other (comment) (defer to next venue)    Recommendations for Other Services PT consult;Rehab consult     Precautions / Restrictions Precautions Precautions: Fall Restrictions Other Position/Activity Restrictions: no order for wb status LLE, treated as NWB      Mobility Bed Mobility Overal bed mobility: Needs Assistance Bed Mobility: Supine to Sit     Supine to sit: Mod assist;HOB elevated     General bed mobility comments: Pt came 75% of the way to EOB. Pt reporting she did not want her LLE to go off the side of the bed. Discussed this with pt with pt declining full EOB. Pt sat mostly with BUE for about 3-4 minutes. Able to  briefly sit with single extremtiy support. Pt lethargic and fatigued easily. Used bed pad to advance hips. Pt very guarded about any movement or proximity to LLE.  Transfers                      Balance Overall balance assessment: Needs assistance Sitting-balance support: Bilateral upper extremity supported Sitting balance-Leahy Scale: Poor Sitting balance - Comments: BUE support for balance, generalized weakness, declined coming fully to EOB so may do better once pt is agreeable to fully sitting EOB.                                    ADL Overall ADL's : Needs assistance/impaired Eating/Feeding: Set up;Bed level   Grooming: Sitting;Moderate assistance Grooming Details (indicate cue type and reason): in unsupported sitting mod A due to mostly needing bilateral extremity support for sitting balance; can briefly sit with single extremity support. Upper Body Bathing: Moderate assistance;Sitting Upper Body Bathing Details (indicate cue type and reason): in unsupported sitting mod A due to mostly needing bilateral extremity support for sitting balance; can briefly sit with single extremity support. Lower Body Bathing: Bed level;Maximal assistance   Upper Body Dressing : Moderate assistance Upper Body Dressing Details (indicate cue type and reason): in unsupported sitting mod A due to mostly needing bilateral extremity support for sitting balance; can briefly sit with single extremity support. Lower Body Dressing: Bed  level;Maximal assistance   Toilet Transfer: +2 for physical assistance Toilet Transfer Details (indicate cue type and reason): transfers not attempted, likely +2     Tub/ Shower Transfer: +2 for physical assistance Tub/Shower Transfer Details (indicate cue type and reason): transfer not attempted, likely +2   General ADL Comments: Pt needing max encouragement to participate minimally in session. During bed mobility, attempted to cue pt for positioning  of LUE which was wedged under pt's trunk with pt leaning to left (weakness?) during slow transition to EOB with pt reporting, "it's fine. It's not squished." Pt with eyes closed often during session and slow responses. Pt balking when OT informed pt that PT would be coming in later: "Why?!" Explained role and benefit of therapy and encouraged pt to particiapte in therapy and mobility each time the opportunity is given to her. Heavily educated on keeping left knee straight. Pt with Lt knee in flexion and crossed over right leg upon therapist arrival. Pt with high operative site guarding.      Vision     Perception     Praxis      Pertinent Vitals/Pain Pain Assessment: 0-10 Pain Score: 10-Worst pain ever Pain Location: LLE Pain Descriptors / Indicators: Cramping;Grimacing;Operative site guarding;Spasm Pain Intervention(s): Limited activity within patient's tolerance;Monitored during session;Premedicated before session;Repositioned;Utilized relaxation techniques     Hand Dominance Right   Extremity/Trunk Assessment Upper Extremity Assessment Upper Extremity Assessment: Generalized weakness   Lower Extremity Assessment Lower Extremity Assessment: Defer to PT evaluation       Communication Communication Communication: No difficulties;Other (comment) (lethargic)   Cognition Arousal/Alertness: Lethargic Behavior During Therapy: Flat affect;Anxious Overall Cognitive Status: No family/caregiver present to determine baseline cognitive functioning                     General Comments       Exercises       Shoulder Instructions      Home Living Family/patient expects to be discharged to:: Unsure                                 Additional Comments: per previous hospital note: Aide comes 5 days/wk and assists with ADLs, housework, gets her groceries; pt reports aide is only there ~2 hours per day      Prior Functioning/Environment Level of Independence:  Needs assistance  Gait / Transfers Assistance Needed: Reports taking bus to dialysis. Uses RW for ambulation. ADL's / Homemaking Assistance Needed: Per previous hospital note: Has aide 3x/wk to assist with cleaning and cooking. Sounds like she mostly sponge bathes and sometimes uses shower (reports she sometimes has someone there when she gets in shower but not all the time)        OT Diagnosis: Generalized weakness;Acute pain   OT Problem List: Decreased strength;Decreased activity tolerance;Impaired balance (sitting and/or standing);Decreased safety awareness;Decreased knowledge of use of DME or AE;Decreased knowledge of precautions;Pain   OT Treatment/Interventions: Self-care/ADL training;Therapeutic exercise;DME and/or AE instruction;Energy conservation;Therapeutic activities;Patient/family education;Balance training    OT Goals(Current goals can be found in the care plan section) Acute Rehab OT Goals Patient Stated Goal: "to get my things together-my body." OT Goal Formulation: With patient Time For Goal Achievement: 07/14/15 Potential to Achieve Goals: Good ADL Goals Pt Will Perform Grooming: with min guard assist;sitting Pt Will Perform Upper Body Bathing: with min guard assist;sitting Pt Will Perform Lower Body Bathing: with min assist;sit to/from stand;with adaptive equipment  Pt Will Perform Upper Body Dressing: with min guard assist;sitting Pt Will Perform Lower Body Dressing: with min assist;with adaptive equipment;sit to/from stand Pt Will Transfer to Toilet: with min assist;with transfer board;anterior/posterior transfer;bedside commode Pt Will Perform Toileting - Clothing Manipulation and hygiene: sitting/lateral leans;with set-up Pt Will Perform Tub/Shower Transfer: with transfer board;anterior/posterior transfer;3 in 1;rolling walker Pt/caregiver will Perform Home Exercise Program: Increased strength;Both right and left upper extremity;With theraband;With written HEP  provided Additional ADL Goal #1: Pt will completed bed mobilty at mod I level to prepare for OOB ADLs.  OT Frequency: Min 2X/week   Barriers to D/C: Decreased caregiver support          Co-evaluation              End of Session Nurse Communication: Other (comment) (pain level and lethargic)  Activity Tolerance: Patient limited by pain;Patient limited by lethargy;Patient limited by fatigue Patient left: in bed;with call bell/phone within reach   Time: 0916-0945 OT Time Calculation (min): 29 min Charges:  OT General Charges $OT Visit: 1 Procedure OT Evaluation $OT Eval Low Complexity: 1 Procedure OT Treatments $Self Care/Home Management : 8-22 mins G-Codes:    Hortencia Pilar 07-09-15, 10:35 AM

## 2015-06-30 NOTE — Care Management Note (Signed)
Case Management Note  Patient Details  Name: [name] MRN: [id number] Date of Birth: 12-24-1960  Subjective/Objective:     CM following for progression and d/c planning.               Action/Plan: 06/30/15 Notified by [facility] that is pt is active with that [facility] services. Will follow and resume services and add services as needed. Await PT eval for rehab needs.   Expected Discharge Date:                  Expected Discharge Plan:  [place]  In-House Referral:  NA  Discharge planning Services  CM Consult  Post Acute Care Choice:  Resumption of Svcs/PTA Provider Choice offered to:  Patient  DME Arranged:    DME Agency:     HH Arranged:  PT, RN [place] Agency:  [place]  Status of Service:  In process, will continue to follow  Medicare Important Message Given:    Date Medicare IM Given:    Medicare IM give by:    Date Additional Medicare IM Given:    Additional Medicare Important Message give by:     If discussed at [place] of Stay Meetings, dates discussed:    Additional Comments:  Adron Bene, RN 06/30/2015, 11:24 AM

## 2015-06-30 NOTE — Progress Notes (Signed)
ANTICOAGULATION CONSULT NOTE - Follow Up Consult  Pharmacy Consult for Warfarin Indication: Hx Afib + CVA  Allergies  Allergen Reactions  . Doxycycline Itching  . Peroxyl [Hydrogen Peroxide] Hives and Rash    Patient Measurements: Weight: 149 lb 0.5 oz (67.6 kg)  Vital Signs: Temp: 99 F (37.2 C) (03/09 0808) Temp Source: Oral (03/09 0808) BP: 159/72 mmHg (03/09 0808) Pulse Rate: 108 (03/09 0808)  Labs:  Recent Labs  06/28/15 0829 06/29/15 0720  HGB 8.8* 7.8*  HCT 28.7* 25.4*  PLT 419* 375  APTT 41*  --   LABPROT 16.5* 16.7*  INR 1.32 1.34  CREATININE 6.68* 8.81*    Estimated Creatinine Clearance: 7.1 mL/min (by C-G formula based on Cr of 8.81).   Assessment: 8 YOF with ESRD on HD MWF who presented on 3/7 for a planned L BKA. PTA the patient was on chronic warfarin for hx Afib and stroke however it was held for 1 week prior to the procedure. Pharmacy asked to resume warfarin post-op.   Lab unable to draw INR (or any labs) this AM despite 3-4 attempts.  I suspect INR still subtherapeutic.  PTA dose was 2.5 mg daily EXCEPT for 5 mg on Sun/Tues/Thurs  Goal of Therapy:  INR 2-3   Plan:  1. Warfarin 5 mg x 1 dose at 1800 today 2. Will continue to monitor for any signs/symptoms of bleeding and will follow up with PT/INR in the a.m.   Uvaldo Rising, BCPS  Clinical Pharmacist Pager [phone]  06/30/2015 3:46 PM

## 2015-06-30 NOTE — Progress Notes (Signed)
Lab attempted to draw blood from patients 4 times today with no success. Consulted IV team who was not able to draw blood unless starting an IV and pt is not in need of IV. Notified PA from vascular surgery team.

## 2015-06-30 NOTE — Progress Notes (Signed)
[facility] Progress Note  Dialysis: NW MWF  3.5h  68kg  450/A1.5  2/2.25 bath  P2  R thigh AVG  Hep 1000  Mircera 200 q 2 weeks - last got 150 on 2/22> Hb 8.3 declining despite ^esatsat 27%  ferr 1479  iPTH 124 Ca/P ok  Assessment: 1. S/P left BKA / nonhealing heel wound - no hep HD; no abx, low grade temp improved 2. ESRD - MWF tight hep for now 3. Volume - titrate to new edw after leg amp 4. Anemia of CKD/ chron infection - has been steadily declining in outpt setting - in part due to chronic infection but need to r/o blood loss; suspect ferritin has been ^ due to chronic wound - resumed IV Fe/ESA;  HGb 7.8 post op stable  5. Metabolic bone disease - Not on VDRA- resume binders  6. Nutrition - needs renal/carb mod diet/vit/nepro 7. AFib -resuming coumadin, on cardizem 120 also 8. MRSA + 9. HTN - cardizem only med, BP's good  Plan - HD Friday, esa/ Fe   Kelly Splinter MD [facility] pager [phone]    cell [phone] 06/30/2015, 10:15 AM    Subjective:   Pain better.   Objective Filed Vitals:   06/29/15 1801 06/29/15 2156 06/30/15 0510 06/30/15 0808  BP: 147/57 128/53 155/60 159/72  Pulse: 124 106 111 108  Temp: 100.5 F (38.1 C) 100.2 F (37.9 C) 99.5 F (37.5 C) 99 F (37.2 C)  TempSrc: Oral Oral Oral Oral  Resp: 16 17 16 16   Weight:  67.6 kg (149 lb 0.5 oz)    SpO2: 95% 96% 99% 99%   Physical Exam General: drowsy but oriented Heart: tachy reg Lungs: no rales Abdomen: soft NT Extremities: left BKA wrapped - knee bent- right LE no sig edema Dialysis Access: right thigh AVGG patent  Additional Objective Labs: Lab Results  Component Value Date   INR 1.34 06/29/2015   INR 1.32 [id number]    Basic Metabolic Panel:  Recent Labs Lab 06/28/15 0829 06/29/15 0720  NA 138 137  K 3.6 4.4  CL 96* 98*  CO2 25 23  GLUCOSE 156* 159*  BUN 22* 30*  CREATININE 6.68* 8.81*  CALCIUM 8.9 8.4*  PHOS  --  5.5*   Liver Function  Tests:  Recent Labs Lab 06/28/15 0829 06/29/15 0720  AST 20  --   ALT 15  --   ALKPHOS 54  --   BILITOT 0.7  --   PROT 7.9  --   ALBUMIN 2.8* 2.6*  CBC:  Recent Labs Lab 06/28/15 0829 06/29/15 0720  WBC 8.5 10.8*  HGB 8.8* 7.8*  HCT 28.7* 25.4*  MCV 82.9 83.6  PLT 419* 375   CBG:  Recent Labs Lab 06/28/15 0820 06/28/15 1025 06/28/15 1216  GLUCAP 147* 122* 124*   Medications:   . acetaminophen  650 mg Oral Q M,W,F  . Chlorhexidine Gluconate Cloth  6 each Topical Daily  . clopidogrel  75 mg Oral Daily  . darbepoetin (ARANESP) injection - DIALYSIS  200 mcg Intravenous Q Wed-HD  . diltiazem  120 mg Oral Daily  . docusate sodium  100 mg Oral Daily  . ferric gluconate (FERRLECIT/NULECIT) IV  125 mg Intravenous Q M,W,F-HD  . glipiZIDE  5 mg Oral QAC breakfast  . multivitamin  1 tablet Oral QHS  . mupirocin ointment  1 application Nasal BID  . pantoprazole  40 mg Oral Daily  . vitamin C  500 mg Oral  Daily  . vitamin E  400 Units Oral Daily  . Warfarin - Pharmacist Dosing Inpatient   Does not apply [phone]

## 2015-06-30 NOTE — Progress Notes (Signed)
Inpatient Rehabilitation  Met with patient to discuss IP Rehab MD and acute therapist recommendations.  Patient was lethargic and required repetitions throughout discussion.  Patient is not currently demonstrating adequate tolerance and does not have adequate caregiver support for a short rehab stay.  Patient also states that after her last amputation she went to SNF, [facility].  Notified RN CM and CSW.  Will sign off at this time.    Carmelia Roller., CCC/SLP Admission Coordinator  [facility]  Cell [phone]

## 2015-07-01 ENCOUNTER — Telehealth: Payer: Self-pay | Admitting: Vascular Surgery

## 2015-07-01 LAB — RENAL FUNCTION PANEL
Albumin: 2.1 g/dL — ABNORMAL LOW (ref 3.5–5.0)
Anion gap: 15 (ref 5–15)
BUN: 27 mg/dL — AB (ref 6–20)
CHLORIDE: 94 mmol/L — AB (ref 101–111)
CO2: 24 mmol/L (ref 22–32)
CREATININE: 8.36 mg/dL — AB (ref 0.44–1.00)
Calcium: 8.4 mg/dL — ABNORMAL LOW (ref 8.9–10.3)
GFR calc Af Amer: 6 mL/min — ABNORMAL LOW (ref >60)
GFR calc non Af Amer: 5 mL/min — ABNORMAL LOW (ref >60)
GLUCOSE: 168 mg/dL — AB (ref 65–99)
Phosphorus: 4.2 mg/dL (ref 2.5–4.6)
Potassium: 3.8 mmol/L (ref 3.5–5.1)
Sodium: 133 mmol/L — ABNORMAL LOW (ref 135–145)

## 2015-07-01 LAB — GLUCOSE, CAPILLARY: Glucose-Capillary: 178 mg/dL — ABNORMAL HIGH (ref 65–99)

## 2015-07-01 LAB — PROTIME-INR
INR: 2.06 — ABNORMAL HIGH (ref 0.00–1.49)
INR: 2.1 — ABNORMAL HIGH (ref 0.00–1.49)
Prothrombin Time: 23.1 seconds — ABNORMAL HIGH (ref 11.6–15.2)
Prothrombin Time: 23.4 seconds — ABNORMAL HIGH (ref 11.6–15.2)

## 2015-07-01 LAB — PREPARE RBC (CROSSMATCH)

## 2015-07-01 MED ORDER — PENTAFLUOROPROP-TETRAFLUOROETH EX AERO: 1.0000 "application " | INHALATION_SPRAY | CUTANEOUS | Status: DC | PRN

## 2015-07-01 MED ORDER — ALTEPLASE 2 MG IJ SOLR
2.0000 mg | Freq: Once | INTRAMUSCULAR | Status: DC | PRN
  Filled 2015-07-01: qty 2

## 2015-07-01 MED ORDER — SODIUM CHLORIDE 0.9 % IV SOLN: 100.0000 mL | INTRAVENOUS | Status: DC | PRN

## 2015-07-01 MED ORDER — HEPARIN SODIUM (PORCINE) 1000 UNIT/ML DIALYSIS: 1000.0000 [IU] | Freq: Once | INTRAMUSCULAR | Status: DC

## 2015-07-01 MED ORDER — LIDOCAINE HCL (PF) 1 % IJ SOLN: 5.0000 mL | INTRAMUSCULAR | Status: DC | PRN

## 2015-07-01 MED ORDER — SODIUM CHLORIDE 0.9 % IV SOLN: Freq: Once | INTRAVENOUS | Status: DC

## 2015-07-01 MED ORDER — CYCLOBENZAPRINE HCL 5 MG PO TABS: 5.0000 mg | ORAL_TABLET | Freq: Three times a day (TID) | ORAL | Status: DC | PRN

## 2015-07-01 MED ORDER — LIDOCAINE-PRILOCAINE 2.5-2.5 % EX CREA: 1.0000 "application " | TOPICAL_CREAM | CUTANEOUS | Status: DC | PRN

## 2015-07-01 MED ORDER — HEPARIN SODIUM (PORCINE) 1000 UNIT/ML DIALYSIS: 1000.0000 [IU] | INTRAMUSCULAR | Status: DC | PRN

## 2015-07-01 NOTE — Telephone Encounter (Addendum)
-----   Message from Mena Goes, RN sent at 07/01/2015 12:41 PM EST ----- Regarding: schedule   ----- Message -----    From: Ulyses Amor, PA-C    Sent: 07/01/2015  12:12 PM      To: Vvs Charge Pool  Left BKA f/u with Dr. [name] 4 weeks from surgery 06/28/2015 DOS.  Notiified patient of post op appt. on 08-03-15 at 11:15  with Dr. [name]  Patient at [facility] - [phone] notified sherry

## 2015-07-01 NOTE — Progress Notes (Signed)
   VASCULAR SURGERY ASSESSMENT & PLAN:  * 3 Days Post-Op s/p: Left BKA  *  Acute blood loss anemia: Hgb = 6.9. I have ordered 2 units PRBC's to be given during dialysis.   * I have encouraged her to keep her knee straight. Will order Limb guard from [employer].   * On Coumadin. INR is therapeutic.  * Awaiting SNF  SUBJECTIVE: Pain well controlled now  PHYSICAL EXAM: Filed Vitals:   06/30/15 2024 07/01/15 0412 07/01/15 0429 07/01/15 0645  BP: 155/66 154/61  126/60  Pulse: 108 104  100  Temp: 99.5 F (37.5 C) 99.6 F (37.6 C)    TempSrc:      Resp: 18 19  20   Weight:   145 lb 1 oz (65.8 kg)   SpO2: 98% 99%     Dressing dry I inspected wound yesterday and it looked good.   LABS: Lab Results  Component Value Date   WBC 14.4* 06/30/2015   HGB 6.9* 06/30/2015   HCT 21.9* 06/30/2015   MCV 82.0 06/30/2015   PLT 369 06/30/2015   Lab Results  Component Value Date   CREATININE 7.64* 06/30/2015   Lab Results  Component Value Date   INR 2.00* 06/30/2015   CBG (last 3)   Recent Labs  06/28/15 1025 06/28/15 1216 06/30/15 2028  GLUCAP 122* 124* 145*    Active Problems:   PAD (peripheral artery disease) (HCC)   S/P BKA (below knee amputation) unilateral (HCC)   Abnormality of gait   Paroxysmal atrial fibrillation (HCC)   History of CVA (cerebrovascular accident)   Type 2 diabetes mellitus with peripheral neuropathy ([place])   ESRD on dialysis ([place])   Postoperative pain   SIRS (systemic inflammatory response syndrome) ([place])   Essential hypertension   Acute blood loss anemia   Anemia of chronic disease   [name] [id number] 07/01/2015

## 2015-07-01 NOTE — Progress Notes (Signed)
OT Cancellation Note  Patient Details Name: [name] MRN: [id number] DOB: 04/29/1960   Cancelled Treatment:    Reason Eval/Treat Not Completed: Patient at procedure or test/ unavailable (at dialysis)  Aryav Wimberly A 07/01/2015, 8:43 AM

## 2015-07-01 NOTE — Discharge Summary (Signed)
[facility] Discharge Summary   Patient ID:  [name] MRN: [id number] DOB/AGE: 53-Jan-1962 53 y.o.  Admit date: 06/28/2015 Discharge date: 07/01/2015 Date of Surgery: 06/28/2015 Surgeon: Surgeon(s): Angelia Mould, MD  Admission Diagnosis: Nonhealing wound left heel L97.409  Discharge Diagnoses:  Nonhealing wound left heel L97.409  Secondary Diagnoses: Past Medical History  Diagnosis Date  . Hypertension   . CVA (cerebral infarction)     right parietal 05/19/2000  . Vaginal bleeding   . Hyperparathyroidism   . Pneumonia   . Stroke ([place])     2002,no residual  . Peripheral vascular disease ([place])   . GERD (gastroesophageal reflux disease)   . Anemia   . DM (diabetes mellitus) type II controlled peripheral vascular disorder ([place])   . Slip/trip w/o falling due to stepping on object, subs July  2014  . ESRD (end stage renal disease) ([place])     dialysis M/W/F  . Constipation   . Complication of anesthesia     Morphine makes her nauseated, states she has small veins  . Atrial fibrillation ([place])     Procedure(s):  Left BELOW KNEE amputation  Discharged Condition: good  HPI: [name] is a 55 y.o. female, who was last seen in our office on 06/01/2015. She has an extensive wound on the left heel. She has undergone previous atherectomy and angioplasty of the left anterior tibial artery and left popliteal artery with a drug-coated balloon by Dr. Trula Slade on 01/11/2015. She had an excellent result from this. However, her left heel wound was quite extensive and I did not think this was salvageable. We have discussed below the knee amputation on multiple occasions and she was against this. Therefore, on 05/05/2015, she underwent excisional debridement of the left heel with placement of a VAC.   She comes in today as she is now agreeable to consider left below the knee amputation. She has had significant pain in the left heel. She last took her Coumadin last  night.   Hospital Course:  [name] is a 55 y.o. female is S/P  Procedure(s):  Left BELOW KNEE amputation Post op she has had difficulty with left knee extension.  PT working on extension and mobility.   She has had acute blood loss amenia HGB 6.9 on POD# 3 07/01/2015 and was transfused 2 units PRBC during HD.    Stable condition left stump healing well.  Maintain stump proter while at rest may shower and wash leg PRN. F/U in the office 4 weeks from surgery for staple and stump check.    Consults:  Treatment Team:  Roney Jaffe, MD  Significant Diagnostic Studies: CBC Lab Results  Component Value Date   WBC 14.4* 06/30/2015   HGB 6.9* 06/30/2015   HCT 21.9* 06/30/2015   MCV 82.0 06/30/2015   PLT 369 06/30/2015    BMET    Component Value Date/Time   NA 133* 07/01/2015 0659   K 3.8 07/01/2015 0659   CL 94* 07/01/2015 0659   CO2 24 07/01/2015 0659   GLUCOSE 168* 07/01/2015 0659   BUN 27* 07/01/2015 0659   CREATININE 8.36* 07/01/2015 0659   CALCIUM 8.4* 07/01/2015 0659   GFRNONAA 5* 07/01/2015 0659   GFRAA 6* 07/01/2015 0659   COAG Lab Results  Component Value Date   INR 2.00* 06/30/2015   INR 1.34 06/29/2015   INR 1.32 06/28/2015     Disposition:  Discharge to :Skilled nursing facility    Medication List    ASK  your doctor about these medications        acetaminophen 325 MG tablet  Commonly known as:  TYLENOL  Take 650 mg by mouth every Monday, Wednesday, and Friday. With dialysis     CALCIUM-VITAMIN D PO  Take 500 mg by mouth 3 (three) times daily with meals.     ciprofloxacin 500 MG tablet  Commonly known as:  CIPRO  Take 1 tablet (500 mg total) by mouth 2 (two) times daily.     clopidogrel 75 MG tablet  Commonly known as:  PLAVIX  Take 75 mg by mouth daily.     collagenase ointment  Commonly known as:  SANTYL  Apply 1 application topically daily. Apply daily to Left Heel  And cover with dry sterile dressing.     diazepam 5 MG tablet   Commonly known as:  VALIUM  Take 5 mg by mouth every 8 (eight) hours as needed for muscle spasms.     diltiazem 120 MG 24 hr capsule  Commonly known as:  CARDIZEM CD  Take 1 capsule (120 mg total) by mouth daily.     diphenhydrAMINE 25 MG tablet  Commonly known as:  BENADRYL  Take 25 mg by mouth every 6 (six) hours as needed (Muscle spasm in feet).     glipiZIDE 10 MG tablet  Commonly known as:  GLUCOTROL  Take 0.5 tablets (5 mg total) by mouth daily.     neomycin-bacitracin-polymyxin [phone] ointment  Apply 1 application topically daily. Apply to foot     polyethylene glycol packet  Commonly known as:  MIRALAX / GLYCOLAX  Take 17 g by mouth daily.     RENA-VITE RX 1 MG Tabs  Take 1 mg by mouth daily.     traMADol 50 MG tablet  Commonly known as:  ULTRAM  Take 1-2 tablets by mouth every 6 hours as needed for foot pain     VITAMIN B-12 PO  Take 1 tablet by mouth 4 (four) times a week. Non dialysis days. Tues, Thurs, Sat and Sunday     vitamin C 500 MG tablet  Commonly known as:  ASCORBIC ACID  Take 500 mg by mouth daily.     vitamin E 400 UNIT capsule  Take 400 Units by mouth daily.     warfarin 5 MG tablet  Commonly known as:  COUMADIN  Take 1/2 to 1 tablet by mouth daily as directed       Verbal and written Discharge instructions given to the patient. Wound care per Discharge AVS     Follow-up Information    Follow up with Deitra Mayo, MD In 3 weeks.   Specialties:  Vascular Surgery, Cardiology   Why:  Office will call you to arrange your appt (sent)   Contact information:   [place] [address] [phone]       Signed: Laurence Slate [facility] 07/01/2015, 12:14 PM

## 2015-07-01 NOTE — NC FL2 (Signed)
[place] MEDICAID FL2 LEVEL OF CARE SCREENING TOOL     IDENTIFICATION  Patient Name: [name] Birthdate: [birth year] Sex: female Admission Date (Current Location): 06/28/2015  [place] and [place] Number:  Kathleen Argue [id number] [place] and Address:  [facility], [place] [address] [place], [place] [address]      Provider Number: [id number]  Attending Physician Name and Address:  Angelia Mould, MD  Relative Name and Phone Number:  Jilleen Schorsch - brother.  Phone [phone]    Current Level of Care: Hospital Recommended Level of Care: [place] Prior Approval Number:    Date Approved/Denied:   PASRR Number: [id number] (Eff. 07/22/12)  Discharge Plan: SNF    Current Diagnoses: Patient Active Problem List   Diagnosis Date Noted  . S/P BKA (below knee amputation) unilateral ([place])   . Abnormality of gait   . Paroxysmal atrial fibrillation (HCC)   . History of CVA (cerebrovascular accident)   . Type 2 diabetes mellitus with peripheral neuropathy (HCC)   . ESRD on dialysis ([place])   . Postoperative pain   . SIRS (systemic inflammatory response syndrome) (HCC)   . Essential hypertension   . Acute blood loss anemia   . Anemia of chronic disease   . Nonhealing nonsurgical wound 05/05/2015  . Chronic anticoagulation - Coumadin, CHADS2VASC=5 04/21/2015  . PAF (paroxysmal atrial fibrillation) ([place]) 04/21/2015  . Atrial fibrillation with RVR ([place]) 03/25/2015  . Atrial fibrillation with rapid ventricular response ([place]) 03/25/2015  . PAD (peripheral artery disease) ([place]) 01/11/2015  . Pleural effusion, right 08/16/2014  . Abdominal pain 08/16/2014  . Chest pain   . Cholecystitis   . Hypoxia   . 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 Ulcer of lower limb ([place]) 09/09/2013  . Pain in limb-left foot 11/19/2012  . End stage renal disease on dialysis ([place])   . DM (diabetes mellitus) type II controlled peripheral vascular disorder ([place])   .  Peripheral vascular disease, unspecified ([place]) 07/02/2012  . Atherosclerosis of native arteries of the extremities with gangrene ([place]) 05/21/2012  . Hyperparathyroidism, secondary ([place]) 12/28/2010    Orientation RESPIRATION BLADDER Height & Weight     Self, Time, Situation, Place  Normal Continent Weight: 142 lb 10.2 oz (64.7 kg) Height:     BEHAVIORAL SYMPTOMS/MOOD NEUROLOGICAL BOWEL NUTRITION STATUS      Continent Diet (Renal/carb modified with fluid restriction)  AMBULATORY STATUS COMMUNICATION OF NEEDS Skin    (Patieint was not ambulated by PT s/p left BKA on 06/28/15) Verbally Normal, Other (Comment) (Incision left leg)                       Personal Care Assistance Level of Assistance  Bathing, Feeding, Dressing Bathing Assistance: Maximum assistance Feeding assistance: Independent Dressing Assistance: Maximum assistance     Functional Limitations Info  Sight, Hearing, Speech Sight Info: Adequate Hearing Info: Adequate Speech Info: Adequate    SPECIAL CARE FACTORS FREQUENCY  PT (By licensed PT), OT (By licensed OT)     PT Frequency: Evaluated 3/9. Minimum of 2X per week therapy recommended OT Frequency: Evaluated 3/9. Minimum of 2X per week therapy recommended            Contractures Contractures Info: Not present    Additional Factors Info  Code Status, Allergies Code Status Info: Full Code Allergies Info: Doxycycline, Peroxyl           Current Medications (07/01/2015):  This is the current hospital active medication list Current  Facility-Administered Medications  Medication Dose Route Frequency Provider Last Rate Last Dose  . 0.9 %  sodium chloride infusion   Intravenous Once Angelia Mould, MD      . acetaminophen (TYLENOL) tablet 325-650 mg  325-650 mg Oral Q4H PRN Ulyses Amor, PA-C   650 mg at 07/01/15 0540   Or  . acetaminophen (TYLENOL) suppository 325-650 mg  325-650 mg Rectal Q4H PRN Ulyses Amor, PA-C      . acetaminophen  (TYLENOL) tablet 650 mg  650 mg Oral Q M,W,F Ulyses Amor, PA-C   650 mg at 06/29/15 1805  . bisacodyl (DULCOLAX) suppository 10 mg  10 mg Rectal Daily PRN Ulyses Amor, PA-C      . Chlorhexidine Gluconate Cloth 2 % PADS 6 each  6 each Topical Daily Angelia Mould, MD   6 each at 07/01/15 1109  . clopidogrel (PLAVIX) tablet 75 mg  75 mg Oral Daily Ulyses Amor, PA-C   75 mg at 07/01/15 1108  . cyclobenzaprine (FLEXERIL) tablet 5 mg  5 mg Oral TID PRN Roney Jaffe, MD      . Darbepoetin Alfa (ARANESP) injection 200 mcg  200 mcg Intravenous Q Wed-HD Alric Seton, PA-C   200 mcg at 06/29/15 1156  . diazepam (VALIUM) tablet 5 mg  5 mg Oral Q8H PRN Ulyses Amor, PA-C   5 mg at 06/28/15 1448  . diltiazem (CARDIZEM CD) 24 hr capsule 120 mg  120 mg Oral Daily Ulyses Amor, PA-C   120 mg at 07/01/15 1109  . diphenhydrAMINE (BENADRYL) capsule 25 mg  25 mg Oral Q6H PRN Ulyses Amor, PA-C   25 mg at 06/29/15 1155  . docusate sodium (COLACE) capsule 100 mg  100 mg Oral Daily Ulyses Amor, PA-C   100 mg at 07/01/15 1109  . ferric gluconate (NULECIT) 125 mg in sodium chloride 0.9 % 100 mL IVPB  125 mg Intravenous Q M,W,F-HD Alric Seton, PA-C   125 mg at 07/01/15 0731  . glipiZIDE (GLUCOTROL) tablet 5 mg  5 mg Oral QAC breakfast Ulyses Amor, PA-C   5 mg at 07/01/15 1107  . guaiFENesin-dextromethorphan (ROBITUSSIN DM) 100-10 MG/5ML syrup 15 mL  15 mL Oral Q4H PRN Ulyses Amor, PA-C      . hydrALAZINE (APRESOLINE) injection 5 mg  5 mg Intravenous Q20 Min PRN Ulyses Amor, PA-C      . labetalol (NORMODYNE,TRANDATE) injection 10 mg  10 mg Intravenous Q10 min PRN Ulyses Amor, PA-C      . metoprolol (LOPRESSOR) injection 2-5 mg  2-5 mg Intravenous Q2H PRN Ulyses Amor, PA-C      . morphine 2 MG/ML injection 2-5 mg  2-5 mg Intravenous Q1H PRN Ulyses Amor, PA-C   2 mg at 07/01/15 0525  . multivitamin (RENA-VIT) tablet 1 tablet  1 tablet Oral QHS Ulyses Amor, PA-C   1 tablet at  06/30/15 2216  . mupirocin ointment (BACTROBAN) 2 % 1 application  1 application Nasal BID Angelia Mould, MD   1 application at [id number]  . ondansetron (ZOFRAN) injection 4 mg  4 mg Intravenous Q6H PRN Ulyses Amor, PA-C      . oxyCODONE (Oxy IR/ROXICODONE) immediate release tablet 5-10 mg  5-10 mg Oral Q4H PRN Ulyses Amor, PA-C   10 mg at 07/01/15 0540  . pantoprazole (PROTONIX) EC tablet 40 mg  40 mg Oral Daily Ulyses Amor, PA-C  40 mg at 07/01/15 1107  . phenol (CHLORASEPTIC) mouth spray 1 spray  1 spray Mouth/Throat PRN Ulyses Amor, PA-C      . polyethylene glycol (MIRALAX / GLYCOLAX) packet 17 g  17 g Oral Daily PRN Ulyses Amor, PA-C      . senna-docusate (Senokot-S) tablet 1 tablet  1 tablet Oral QHS PRN Ulyses Amor, PA-C      . vitamin C (ASCORBIC ACID) tablet 500 mg  500 mg Oral Daily Ulyses Amor, PA-C   500 mg at 07/01/15 1108  . vitamin E capsule 400 Units  400 Units Oral Daily Ulyses Amor, PA-C   400 Units at 07/01/15 1108  . Warfarin - Pharmacist Dosing Inpatient   Does not apply [place], [place] at 07/01/15 1510     Discharge Medications: Please see discharge summary for a list of discharge medications.  Relevant Imaging Results:  Relevant Lab Results:   Additional Information Patient receives dialysis MWF at [facility]. SS#[id number]  Sable Feil, LCSW    Adele Barthel, MD, FACS [facility] [place] Office: [phone] Pager: [phone]  07/01/2015, 8:22 PM

## 2015-07-01 NOTE — Progress Notes (Signed)
ANTICOAGULATION CONSULT NOTE - Follow Up Consult  Pharmacy Consult for Warfarin Indication: Hx Afib + CVA  Allergies  Allergen Reactions  . Doxycycline Itching  . Peroxyl [Hydrogen Peroxide] Hives and Rash    Patient Measurements: Weight: 142 lb 10.2 oz (64.7 kg)  Vital Signs: Temp: 98.4 F (36.9 C) (03/10 1106) Temp Source: Oral (03/10 1106) BP: 137/64 mmHg (03/10 1106) Pulse Rate: 99 (03/10 1106)  Labs:  Recent Labs  06/29/15 0720 06/30/15 2235 07/01/15 0659  HGB 7.8* 6.9*  --   HCT 25.4* 21.9*  --   PLT 375 369  --   LABPROT 16.7* 22.6*  --   INR 1.34 2.00*  --   CREATININE 8.81* 7.64* 8.36*    Estimated Creatinine Clearance: 6.8 mL/min (by C-G formula based on Cr of 8.36).   Assessment: 11 YOF with ESRD on HD MWF who presented on 3/7 for a planned L BKA. PTA the patient was on warfarin 2.5mg  daily exc 5mg  on Sun/Tues/Thurs resumed from PTA for hx Afib/CVA. Held for 1 week PTA for planned BKA. INR 1.32 on admit on PTA dose but expected to be low since held. Coumadin resumed on 3/7. Given larger doses than PTA coumadin. INR jumped to 2.00 from 1.3. Have had difficulty with obtaining blood for labs. Patient refusing labs outside of HD and INR lab was missed this morning. Phleb said they would try and send someone up today to try but lab not collected yet. Hgb trending down to 6.9, plts wnl.  Goal of Therapy:  INR 2-3  Monitor platelets   Plan:  Will hold coumadin tonight (due to big jump yesterday) Monitor daily INR, CBC, s/s of bleed  Might have to just resume PTA dose of 2.5mg  daily exc 5mg  on Sun/Tues/Thurs and check INR when able with HD. Next HD session is Monday 3/13

## 2015-07-01 NOTE — Progress Notes (Signed)
[facility] Progress Note  Dialysis: [facility] MWF  3.5h  68kg  450/A1.5  2/2.25 bath  P2  R thigh AVG  Hep 1000  Mircera 200 q 2 weeks - last got 150 on 2/22> Hb 8.3 declining despite ^esatsat 27%  ferr 1479  iPTH 124 Ca/P ok  Assessment: 1. S/P left BKA / nonhealing heel wound - no hep HD; no abx, low grade fevers resolving. WBC up though, cont to watch.  2. ESRD - MWF tight hep for now 3. Volume - titrate to new edw after leg amp ~65kg goal 4. Anemia of CKD/ chron infection - has been steadily declining in outpt setting - in part due to chronic infection but need to r/o blood loss; suspect ferritin has been ^ due to chronic wound - resumed IV Fe/ESA;  HGb 6.9 5. Metabolic bone disease - Not on VDRA- resume binders  6. Nutrition - needs renal/carb mod diet/vit/nepro 7. AFib -resuming coumadin, on cardizem 120 also 8. MRSA + 9. HTN - cardizem only med, BP's good  Plan - HD today, esa/ Fe, transfusing w HD today. Awaiting SNF   Kelly Splinter MD [facility] pager [phone]    cell [phone] 07/01/2015, 9:12 AM    Subjective:   Pain better.   Objective Filed Vitals:   07/01/15 0730 07/01/15 0800 07/01/15 0830 07/01/15 0900  BP: 119/60 121/62 106/56 111/64  Pulse: 94 93 93 96  Temp:      TempSrc:      Resp: 20 18 18 14   Weight:      SpO2:       Physical Exam General: drowsy but oriented Heart: tachy reg Lungs: no rales Abdomen: soft NT Extremities: left BKA wrapped - knee bent- right LE no sig edema Dialysis Access: right thigh AVGG patent  Additional Objective Labs: Lab Results  Component Value Date   INR 1.34 06/29/2015   INR 1.32 [id number]    Basic Metabolic Panel:  Recent Labs Lab 06/29/15 0720 06/30/15 2235 07/01/15 0659  NA 137 134* 133*  K 4.4 3.9 3.8  CL 98* 93* 94*  CO2 23 26 24   GLUCOSE 159* 148* 168*  BUN 30* 24* 27*  CREATININE 8.81* 7.64* 8.36*  CALCIUM 8.4* 8.7* 8.4*  PHOS 5.5*  --  4.2   Liver Function  Tests:  Recent Labs Lab 06/28/15 0829 06/29/15 0720 07/01/15 0659  AST 20  --   --   ALT 15  --   --   ALKPHOS 54  --   --   BILITOT 0.7  --   --   PROT 7.9  --   --   ALBUMIN 2.8* 2.6* 2.1*  CBC:  Recent Labs Lab 06/28/15 0829 06/29/15 0720 06/30/15 2235  WBC 8.5 10.8* 14.4*  HGB 8.8* 7.8* 6.9*  HCT 28.7* 25.4* 21.9*  MCV 82.9 83.6 82.0  PLT 419* 375 369   CBG:  Recent Labs Lab 06/28/15 0820 06/28/15 1025 06/28/15 1216 06/30/15 2028  GLUCAP 147* 122* 124* 145*   Medications:   . sodium chloride   Intravenous Once  . acetaminophen  650 mg Oral Q M,W,F  . Chlorhexidine Gluconate Cloth  6 each Topical Daily  . clopidogrel  75 mg Oral Daily  . darbepoetin (ARANESP) injection - DIALYSIS  200 mcg Intravenous Q Wed-HD  . diltiazem  120 mg Oral Daily  . docusate sodium  100 mg Oral Daily  . ferric gluconate (FERRLECIT/NULECIT) IV  125 mg Intravenous Q M,W,F-HD  .  glipiZIDE  5 mg Oral QAC breakfast  . [START ON 07/02/2015] heparin  1,000 Units Dialysis Once in dialysis  . multivitamin  1 tablet Oral QHS  . mupirocin ointment  1 application Nasal BID  . pantoprazole  40 mg Oral Daily  . vitamin C  500 mg Oral Daily  . vitamin E  400 Units Oral Daily  . Warfarin - Pharmacist Dosing Inpatient   Does not apply [phone]

## 2015-07-01 NOTE — Clinical Social Work Placement (Signed)
   CLINICAL SOCIAL WORK PLACEMENT  NOTE  Date:  07/01/2015  Patient Details  Name: [name] MRN: [id number] Date of Birth: [birth year]  Clinical Social Work is seeking post-discharge placement for this patient at the [place] level of care (*CSW will initial, date and re-position this form in  chart as items are completed):  No   Patient/family provided with [place] Work Department's list of facilities offering this level of care within the geographic area requested by the patient (or if unable, by the patient's family).  Yes   Patient/family informed of their freedom to choose among providers that offer the needed level of care, that participate in Medicare, Medicaid or managed care program needed by the patient, have an available bed and are willing to accept the patient.  No   Patient/family informed of [place]'s ownership interest in [facility] and [facility], as well as of the fact that they are under no obligation to receive care at these facilities.  PASRR submitted to EDS on       PASRR number received on       Existing PASRR number confirmed on 07/01/15     FL2 transmitted to all facilities in geographic area requested by pt/family on 07/01/15     FL2 transmitted to all facilities within larger geographic area on       Patient informed that his/her managed care company has contracts with or will negotiate with certain facilities, including the following:            Patient/family informed of bed offers received.  Patient chooses bed at       Physician recommends and patient chooses bed at      Patient to be transferred to   on  .  Patient to be transferred to facility by       Patient family notified on   of transfer.  Name of family member notified:        PHYSICIAN       Additional Comment:    _______________________________________________ Sable Feil, LCSW 07/01/2015, 5:54 PM

## 2015-07-01 NOTE — Clinical Social Work Note (Signed)
Clinical Social Work Assessment  Patient Details  Name: [name] MRN: [id number] Date of Birth: 1961-01-30  Date of referral:  07/01/15               Reason for consult:  Facility Placement                Permission sought to share information with:  Family Supports Permission granted to share information::  Yes, Verbal Permission Granted  Name::     Helvi Srader  Agency::     Relationship::  Brother  Contact Information:  [phone]  Housing/Transportation Living arrangements for the past 2 months:  Apartment Source of Information:  Patient Patient Interpreter Needed:  None Criminal Activity/Legal Involvement Pertinent to Current Situation/Hospitalization:  No - Comment as needed Significant Relationships:  Siblings Lives with:  Self Do you feel safe going back to the place where you live?  Yes (Patient feels safe at home but understands that she needs ST rehab before going home) Need for family participation in patient care:  No (Coment)  Care giving concerns:  None expressed by patient.   Social Worker assessment / plan:  CSW talked with patient about discharge planning and recommendation of ST rehab. Ms. Parham reported that she has been to rehab before at [facility] and she is agreeable to returning to this facility. Patient was pleased with the rehab provided at [facility]. Patient informed that she may discharge over the weekend.  Employment status:  Disabled (Comment on whether or not currently receiving Disability) Insurance information:  Medicare, Medicaid [place] PT Recommendations:  [place] / Referral to community resources:  [place] (Skilled facility list not needed by patient)  Patient/Family's Response to care:  No concerns expressed regarding care during hospitalization.  Patient/Family's Understanding of and Emotional Response to Diagnosis, Current Treatment, and Prognosis:  Not  discussed.  Emotional Assessment Appearance:  Appears older than stated age Attitude/Demeanor/Rapport:  Other (Appropriate) Affect (typically observed):  Appropriate, Calm Orientation:  Oriented to Self, Oriented to Place, Oriented to  Time, Oriented to Situation Alcohol / Substance use:   (Patient reports that she does not smoke, drink or use illicit drugs.) Psych involvement (Current and /or in the community):  No (Comment)  Discharge Needs  Concerns to be addressed:  Discharge Planning Concerns Readmission within the last 30 days:  Yes Current discharge risk:  None Barriers to Discharge:  No Barriers Identified   Sable Feil, LCSW 07/01/2015, 5:51 PM

## 2015-07-01 NOTE — Progress Notes (Signed)
Utilization review completed. Mahdi Frye, RN, BSN. 

## 2015-07-02 DIAGNOSIS — Z8701 Personal history of pneumonia (recurrent): Secondary | ICD-10-CM | POA: Diagnosis not present

## 2015-07-02 DIAGNOSIS — T8859XS Other complications of anesthesia, sequela: Secondary | ICD-10-CM | POA: Diagnosis not present

## 2015-07-02 DIAGNOSIS — D638 Anemia in other chronic diseases classified elsewhere: Secondary | ICD-10-CM | POA: Diagnosis not present

## 2015-07-02 DIAGNOSIS — S91309A Unspecified open wound, unspecified foot, initial encounter: Secondary | ICD-10-CM | POA: Diagnosis not present

## 2015-07-02 DIAGNOSIS — Z87828 Personal history of other (healed) physical injury and trauma: Secondary | ICD-10-CM | POA: Diagnosis not present

## 2015-07-02 DIAGNOSIS — Z89512 Acquired absence of left leg below knee: Secondary | ICD-10-CM | POA: Diagnosis not present

## 2015-07-02 DIAGNOSIS — Z992 Dependence on renal dialysis: Secondary | ICD-10-CM | POA: Diagnosis not present

## 2015-07-02 DIAGNOSIS — N2581 Secondary hyperparathyroidism of renal origin: Secondary | ICD-10-CM | POA: Diagnosis not present

## 2015-07-02 DIAGNOSIS — D599 Acquired hemolytic anemia, unspecified: Secondary | ICD-10-CM | POA: Diagnosis not present

## 2015-07-02 DIAGNOSIS — N939 Abnormal uterine and vaginal bleeding, unspecified: Secondary | ICD-10-CM | POA: Diagnosis not present

## 2015-07-02 DIAGNOSIS — I871 Compression of vein: Secondary | ICD-10-CM | POA: Diagnosis not present

## 2015-07-02 DIAGNOSIS — Z8673 Personal history of transient ischemic attack (TIA), and cerebral infarction without residual deficits: Secondary | ICD-10-CM | POA: Diagnosis not present

## 2015-07-02 DIAGNOSIS — E1129 Type 2 diabetes mellitus with other diabetic kidney complication: Secondary | ICD-10-CM | POA: Diagnosis not present

## 2015-07-02 DIAGNOSIS — T82858D Stenosis of vascular prosthetic devices, implants and grafts, subsequent encounter: Secondary | ICD-10-CM | POA: Diagnosis not present

## 2015-07-02 DIAGNOSIS — I739 Peripheral vascular disease, unspecified: Secondary | ICD-10-CM | POA: Diagnosis not present

## 2015-07-02 DIAGNOSIS — R58 Hemorrhage, not elsewhere classified: Secondary | ICD-10-CM | POA: Diagnosis not present

## 2015-07-02 DIAGNOSIS — T82838A Hemorrhage of vascular prosthetic devices, implants and grafts, initial encounter: Secondary | ICD-10-CM | POA: Diagnosis present

## 2015-07-02 DIAGNOSIS — D62 Acute posthemorrhagic anemia: Secondary | ICD-10-CM | POA: Diagnosis not present

## 2015-07-02 DIAGNOSIS — Z79899 Other long term (current) drug therapy: Secondary | ICD-10-CM | POA: Diagnosis not present

## 2015-07-02 DIAGNOSIS — I69898 Other sequelae of other cerebrovascular disease: Secondary | ICD-10-CM | POA: Diagnosis not present

## 2015-07-02 DIAGNOSIS — Y658 Other specified misadventures during surgical and medical care: Secondary | ICD-10-CM | POA: Diagnosis not present

## 2015-07-02 DIAGNOSIS — Z792 Long term (current) use of antibiotics: Secondary | ICD-10-CM | POA: Diagnosis not present

## 2015-07-02 DIAGNOSIS — Z9889 Other specified postprocedural states: Secondary | ICD-10-CM | POA: Diagnosis not present

## 2015-07-02 DIAGNOSIS — Z7901 Long term (current) use of anticoagulants: Secondary | ICD-10-CM | POA: Diagnosis not present

## 2015-07-02 DIAGNOSIS — E213 Hyperparathyroidism, unspecified: Secondary | ICD-10-CM | POA: Diagnosis not present

## 2015-07-02 DIAGNOSIS — T83498A Other mechanical complication of other prosthetic devices, implants and grafts of genital tract, initial encounter: Secondary | ICD-10-CM | POA: Diagnosis not present

## 2015-07-02 DIAGNOSIS — D688 Other specified coagulation defects: Secondary | ICD-10-CM | POA: Diagnosis not present

## 2015-07-02 DIAGNOSIS — L97409 Non-pressure chronic ulcer of unspecified heel and midfoot with unspecified severity: Secondary | ICD-10-CM | POA: Diagnosis not present

## 2015-07-02 DIAGNOSIS — D631 Anemia in chronic kidney disease: Secondary | ICD-10-CM | POA: Diagnosis not present

## 2015-07-02 DIAGNOSIS — K5909 Other constipation: Secondary | ICD-10-CM | POA: Diagnosis not present

## 2015-07-02 DIAGNOSIS — I120 Hypertensive chronic kidney disease with stage 5 chronic kidney disease or end stage renal disease: Secondary | ICD-10-CM | POA: Diagnosis not present

## 2015-07-02 DIAGNOSIS — N186 End stage renal disease: Secondary | ICD-10-CM | POA: Diagnosis not present

## 2015-07-02 DIAGNOSIS — Z89522 Acquired absence of left knee: Secondary | ICD-10-CM | POA: Diagnosis not present

## 2015-07-02 DIAGNOSIS — E088 Diabetes mellitus due to underlying condition with unspecified complications: Secondary | ICD-10-CM | POA: Diagnosis not present

## 2015-07-02 DIAGNOSIS — T82898A Other specified complication of vascular prosthetic devices, implants and grafts, initial encounter: Secondary | ICD-10-CM | POA: Diagnosis not present

## 2015-07-02 DIAGNOSIS — I482 Chronic atrial fibrillation: Secondary | ICD-10-CM | POA: Diagnosis not present

## 2015-07-02 DIAGNOSIS — F4320 Adjustment disorder, unspecified: Secondary | ICD-10-CM | POA: Diagnosis not present

## 2015-07-02 DIAGNOSIS — I4891 Unspecified atrial fibrillation: Secondary | ICD-10-CM | POA: Diagnosis not present

## 2015-07-02 DIAGNOSIS — E1142 Type 2 diabetes mellitus with diabetic polyneuropathy: Secondary | ICD-10-CM | POA: Diagnosis not present

## 2015-07-02 DIAGNOSIS — D649 Anemia, unspecified: Secondary | ICD-10-CM | POA: Diagnosis not present

## 2015-07-02 DIAGNOSIS — K219 Gastro-esophageal reflux disease without esophagitis: Secondary | ICD-10-CM | POA: Diagnosis not present

## 2015-07-02 DIAGNOSIS — E1151 Type 2 diabetes mellitus with diabetic peripheral angiopathy without gangrene: Secondary | ICD-10-CM | POA: Diagnosis not present

## 2015-07-02 DIAGNOSIS — I10 Essential (primary) hypertension: Secondary | ICD-10-CM | POA: Diagnosis not present

## 2015-07-02 DIAGNOSIS — K5900 Constipation, unspecified: Secondary | ICD-10-CM | POA: Diagnosis not present

## 2015-07-02 LAB — TYPE AND SCREEN
ABO/RH(D): O POS
Antibody Screen: NEGATIVE
UNIT DIVISION: 0
UNIT DIVISION: 0

## 2015-07-02 LAB — CBC
HCT: 29.6 % — ABNORMAL LOW (ref 36.0–46.0)
Hemoglobin: 9.4 g/dL — ABNORMAL LOW (ref 12.0–15.0)
MCH: 26 pg (ref 26.0–34.0)
MCHC: 31.8 g/dL (ref 30.0–36.0)
MCV: 81.8 fL (ref 78.0–100.0)
PLATELETS: 374 10*3/uL (ref 150–400)
RBC: 3.62 MIL/uL — ABNORMAL LOW (ref 3.87–5.11)
RDW: 16.9 % — ABNORMAL HIGH (ref 11.5–15.5)
WBC: 13.3 10*3/uL — ABNORMAL HIGH (ref 4.0–10.5)

## 2015-07-02 LAB — PROTIME-INR
INR: 2.19 — ABNORMAL HIGH (ref 0.00–1.49)
PROTHROMBIN TIME: 24.1 s — AB (ref 11.6–15.2)

## 2015-07-02 LAB — GLUCOSE, CAPILLARY
GLUCOSE-CAPILLARY: 77 mg/dL (ref 65–99)
Glucose-Capillary: 176 mg/dL — ABNORMAL HIGH (ref 65–99)
Glucose-Capillary: 219 mg/dL — ABNORMAL HIGH (ref 65–99)

## 2015-07-02 MED ORDER — OXYCODONE HCL 5 MG PO TABS: 5.0000 mg | ORAL_TABLET | ORAL | Status: DC | PRN

## 2015-07-02 MED ORDER — WARFARIN SODIUM 2.5 MG PO TABS: 2.5000 mg | ORAL_TABLET | Freq: Once | ORAL | Status: DC

## 2015-07-02 NOTE — Progress Notes (Signed)
Report called to "Raquel" at [facility]. Patient dressed and all belongings gathered.transport called.

## 2015-07-02 NOTE — Clinical Social Work Placement (Signed)
   CLINICAL SOCIAL WORK PLACEMENT  NOTE  Date:  07/02/2015  Patient Details  Name: [name] MRN: [id number] Date of Birth: 1960/07/13  Clinical Social Work is seeking post-discharge placement for this patient at the [place] level of care (*CSW will initial, date and re-position this form in  chart as items are completed):  No   Patient/family provided with [place] Work Department's list of facilities offering this level of care within the geographic area requested by the patient (or if unable, by the patient's family).  Yes   Patient/family informed of their freedom to choose among providers that offer the needed level of care, that participate in Medicare, Medicaid or managed care program needed by the patient, have an available bed and are willing to accept the patient.  No   Patient/family informed of [place]'s ownership interest in [facility] and [facility], as well as of the fact that they are under no obligation to receive care at these facilities.  PASRR submitted to EDS on       PASRR number received on       Existing PASRR number confirmed on 07/01/15     FL2 transmitted to all facilities in geographic area requested by pt/family on 07/01/15     FL2 transmitted to all facilities within larger geographic area on       Patient informed that his/her managed care company has contracts with or will negotiate with certain facilities, including the following:        Yes   Patient/family informed of bed offers received.  Patient chooses bed at [facility]     Physician recommends and patient chooses bed at      Patient to be transferred to [facility] on 07/02/15.  Patient to be transferred to facility by Ambulance     Patient family notified on 07/02/15 of transfer.  Name of family member notified:  Patient has notified siblings     PHYSICIAN Please prepare priority discharge  summary, including medications, Please prepare prescriptions, Please sign FL2     Additional Comment:    _______________________________________________ Rigoberto Noel, LCSW 07/02/2015, 3:33 PM

## 2015-07-02 NOTE — Progress Notes (Signed)
Subjective:  Tolerated  Hd yest on schedule / awaiting  nh rehb placement   Objective Vital signs in last 24 hours: Filed Vitals:   07/01/15 1622 07/01/15 2228 07/02/15 0521 07/02/15 0903  BP: 135/70 147/66 120/58 141/63  Pulse: 101 105 98 102  Temp: 100.1 F (37.8 C) 99.6 F (37.6 C) 99.5 F (37.5 C) 99.1 F (37.3 C)  TempSrc: Oral   Oral  Resp: 16 16 17 18   Weight:  63 kg (138 lb 14.2 oz)    SpO2: 99% 98% 97% 96%   Weight change: -1.1 kg (-2 lb 6.8 oz) Physical Exam General:Alert / OX3/ Nl MS Heart:  RRR no mur, rub, gallop Lungs: CTA  Bilat.  Abdomen: soft NT Extremities: left BKA wrapped - knee bent- right LE pedal  edema Dialysis Access: right thigh AVGG pos bruit    OP Dialysis: [facility] MWF 3.5h 68kg 450/A1.5 2/2.25 bath P2 R thigh AVG Hep 1000  Mircera 200 q 2 weeks - last got 150 on 2/22> Hb 8.3 declining despite ^esatsat 27% ferr 1479  iPTH 124 Ca/P ok  Problem/Plan: 1. S/P left BKA for nonhealing heel wound - no hep HD; no abx, low grade fevers  Improving . WBC sl down today , cont to watch.  2. ESRD - MWF tight hep for now 3. Volume/HTN - cardizem only med, BP's good - titrate to new edw after leg amp ~ wt to 64.7 post hd yest 4. Anemia of CKD/ chron infection -  hgb 6.9  transfused 2 u prbcs on hd yest  Am hd to 9.4 resumed IV Fe/ESA; 5. Metabolic bone disease - Not on VDRA- resume binders / ca , phos stable  6. Nutrition - needs renal/carb mod diet/vit/nepro 7. AFib -resuming coumadin, on cardizem 120 also   Ernest Haber, PA-C [place] [phone] 07/02/2015,9:21 AM  LOS: 4 days   Pt seen, examined and agree w A/P as above.  Kelly Splinter MD [facility] pager [phone]    cell [phone] 07/02/2015, 1:03 PM    Labs: Basic Metabolic Panel:  Recent Labs Lab 06/29/15 0720 06/30/15 2235 07/01/15 0659  NA 137 134* 133*  K 4.4 3.9 3.8  CL 98* 93* 94*  CO2 23 26 24   GLUCOSE 159* 148* 168*  BUN 30*  24* 27*  CREATININE 8.81* 7.64* 8.36*  CALCIUM 8.4* 8.7* 8.4*  PHOS 5.5*  --  4.2   Liver Function Tests:  Recent Labs Lab 06/28/15 0829 06/29/15 0720 07/01/15 0659  AST 20  --   --   ALT 15  --   --   ALKPHOS 54  --   --   BILITOT 0.7  --   --   PROT 7.9  --   --   ALBUMIN 2.8* 2.6* 2.1*     Recent Labs Lab 06/28/15 0829 06/29/15 0720 06/30/15 2235 07/02/15 0658  WBC 8.5 10.8* 14.4* 13.3*  HGB 8.8* 7.8* 6.9* 9.4*  HCT 28.7* 25.4* 21.9* 29.6*  MCV 82.9 83.6 82.0 81.8  PLT 419* 375 369 374   Cardiac Enzymes: No results for input(s): CKTOTAL, CKMB, CKMBINDEX, TROPONINI in the last 168 hours. CBG:  Recent Labs Lab 06/28/15 1025 06/28/15 1216 06/30/15 2028 07/01/15 2222 07/02/15 0822  GLUCAP 122* 124* 145* 178* 77    Studies/Results: No results found. Medications:   . sodium chloride   Intravenous Once  . acetaminophen  650 mg Oral Q M,W,F  . Chlorhexidine Gluconate Cloth  6 each Topical  Daily  . clopidogrel  75 mg Oral Daily  . darbepoetin (ARANESP) injection - DIALYSIS  200 mcg Intravenous Q Wed-HD  . diltiazem  120 mg Oral Daily  . docusate sodium  100 mg Oral Daily  . ferric gluconate (FERRLECIT/NULECIT) IV  125 mg Intravenous Q M,W,F-HD  . glipiZIDE  5 mg Oral QAC breakfast  . multivitamin  1 tablet Oral QHS  . mupirocin ointment  1 application Nasal BID  . pantoprazole  40 mg Oral Daily  . vitamin C  500 mg Oral Daily  . vitamin E  400 Units Oral Daily  . Warfarin - Pharmacist Dosing Inpatient   Does not apply [phone]

## 2015-07-02 NOTE — Clinical Social Work Note (Addendum)
CSW notified PA that the DC summary is still incomplete. Facility will not accept the patient until completed DC Summary is faxed to the facility ([facility]). CSW will leave completed transport documents on the chart so that RN can facilitate patient's discharge. RN will have to fax discharge summary to [phone] and call report to [phone] when patient can be discharged. RN will need to call transport [phone] for patient at time of DC. Numbers left on the patient's transport form. The patient has informed her family that she will DC today.     CSW has instructed RN on how to facilitate the discharge. Facility states they will be waiting for DC summary. CSW signing off at this time.   Liz Beach MSW, [place], [place], [id number]

## 2015-07-02 NOTE — Clinical Social Work Note (Signed)
DC Summary faxed to the facility. RN to call for transport once she is ready to DC the patient.   Liz Beach MSW, [place], [place], [id number]

## 2015-07-02 NOTE — Progress Notes (Signed)
ANTICOAGULATION CONSULT NOTE - Follow Up Consult  Pharmacy Consult for Warfarin Indication: Hx Afib + CVA  Allergies  Allergen Reactions  . Doxycycline Itching  . Peroxyl [Hydrogen Peroxide] Hives and Rash    Patient Measurements: Weight: 138 lb 14.2 oz (63 kg)  Vital Signs: Temp: 99.1 F (37.3 C) (03/11 0903) Temp Source: Oral (03/11 0903) BP: 141/63 mmHg (03/11 0903) Pulse Rate: 102 (03/11 0903)  Labs:  Recent Labs  06/30/15 2235 07/01/15 0659 07/01/15 1510 07/02/15 0658  HGB 6.9*  --   --  9.4*  HCT 21.9*  --   --  29.6*  PLT 369  --   --  374  LABPROT 22.6*  --  23.4*  23.1* 24.1*  INR 2.00*  --  2.10*  2.06* 2.19*  CREATININE 7.64* 8.36*  --   --     Estimated Creatinine Clearance: 6.8 mL/min (by C-G formula based on Cr of 8.36).   Assessment: 63 YOF with ESRD on HD MWF who presented on 3/7 for a planned L BKA. PTA the patient was on warfarin 2.5mg  daily exc 5mg  on Sun/Tues/Thurs resumed from PTA for hx Afib/CVA. Held for 1 week PTA for planned BKA. INR 1.32 on admit on PTA dose but expected to be low since held. Coumadin resumed on 3/7. Given larger doses than PTA coumadin. INR jumped to 2.00 from 1.3.  Hgb 9.4, plts stable. INR 1.34 > 2 > 2.1 > 2.19  Goal of Therapy:  INR 2-3  Monitor platelets   Plan:  Warfarin 2.5 mg po x 1 tonight Monitor daily INR, CBC, s/s of bleed  Angela Burke, PharmD Pharmacy Resident Pager: [phone]

## 2015-07-02 NOTE — Progress Notes (Addendum)
[facility] [place]  Subjective  - Doing OK over all.     Objective 120/58 98 99.5 F (37.5 C) (Oral) 17 97%  Intake/Output Summary (Last 24 hours) at 07/02/15 0826 Last data filed at 07/01/15 1857  Gross per 24 hour  Intake    900 ml  Output   2010 ml  Net  -1110 ml    Left BKA incision is clean and dry healing well Stump guard in place and stump sock.  Assessment/Planning: POD #4 Left BKA  Pending SNF  F/U in 4 weeks for staple removal with DR. Pryor Montes, [facility] 07/02/2015 8:26 AM --  Laboratory Lab Results:  Recent Labs  06/30/15 2235  WBC 14.4*  HGB 6.9*  HCT 21.9*  PLT 369   BMET  Recent Labs  06/30/15 2235 07/01/15 0659  NA 134* 133*  K 3.9 3.8  CL 93* 94*  CO2 26 24  GLUCOSE 148* 168*  BUN 24* 27*  CREATININE 7.64* 8.36*  CALCIUM 8.7* 8.4*    COAG Lab Results  Component Value Date   INR 2.10* 07/01/2015   INR 2.06* 07/01/2015   INR 2.00* 06/30/2015   No results found for: PTT   Addendum  I have independently interviewed and examined the patient, and I agree with the physician assistant's findings.  Ok to D/C.  Limbguard in place with knee straight.  Stock sock in place.  Adele Barthel, MD [facility] [place] Office: [phone] Pager: [phone]  07/02/2015, 2:15 PM

## 2015-07-05 ENCOUNTER — Encounter: Payer: Self-pay | Admitting: Adult Health

## 2015-07-05 ENCOUNTER — Non-Acute Institutional Stay (SKILLED_NURSING_FACILITY): Payer: Medicare Other | Admitting: Adult Health

## 2015-07-05 DIAGNOSIS — N186 End stage renal disease: Secondary | ICD-10-CM

## 2015-07-05 DIAGNOSIS — I10 Essential (primary) hypertension: Secondary | ICD-10-CM | POA: Diagnosis not present

## 2015-07-05 DIAGNOSIS — Z89512 Acquired absence of left leg below knee: Secondary | ICD-10-CM

## 2015-07-05 DIAGNOSIS — E1129 Type 2 diabetes mellitus with other diabetic kidney complication: Secondary | ICD-10-CM | POA: Diagnosis not present

## 2015-07-05 DIAGNOSIS — D631 Anemia in chronic kidney disease: Secondary | ICD-10-CM | POA: Diagnosis not present

## 2015-07-05 DIAGNOSIS — I4891 Unspecified atrial fibrillation: Secondary | ICD-10-CM

## 2015-07-05 DIAGNOSIS — Z992 Dependence on renal dialysis: Secondary | ICD-10-CM

## 2015-07-05 DIAGNOSIS — I739 Peripheral vascular disease, unspecified: Secondary | ICD-10-CM

## 2015-07-05 DIAGNOSIS — N2581 Secondary hyperparathyroidism of renal origin: Secondary | ICD-10-CM | POA: Diagnosis not present

## 2015-07-05 DIAGNOSIS — D638 Anemia in other chronic diseases classified elsewhere: Secondary | ICD-10-CM

## 2015-07-05 DIAGNOSIS — E1151 Type 2 diabetes mellitus with diabetic peripheral angiopathy without gangrene: Secondary | ICD-10-CM | POA: Diagnosis not present

## 2015-07-05 NOTE — Progress Notes (Signed)
Patient ID: [name], female   DOB: [birth year], 55 y.o.   MRN: [id number]   Facility:  [facility]       Allergies  Allergen Reactions  . Doxycycline Itching  . Peroxyl [Hydrogen Peroxide] Hives and Rash    Chief Complaint  Patient presents with  . Hospitalization Follow-up    Hospital Follow-up    HPI:  She has been hospitalized for a left bka due to a non-healing left heel ulceration. She is here for short term rehab with her goal to return back home. She is having some left stump pain. There are no nursing concerns at this time.    Past Medical History  Diagnosis Date  . Hypertension   . CVA (cerebral infarction)     right parietal 05/19/2000  . Vaginal bleeding   . Hyperparathyroidism   . Pneumonia   . Stroke ([place])     2002,no residual  . Peripheral vascular disease ([place])   . GERD (gastroesophageal reflux disease)   . Anemia   . DM (diabetes mellitus) type II controlled peripheral vascular disorder ([place])   . Slip/trip w/o falling due to stepping on object, subs July  2014  . ESRD (end stage renal disease) ([place])     dialysis M/W/F  . Constipation   . Complication of anesthesia     Morphine makes her nauseated, states she has small veins  . Atrial fibrillation [facility])     Past Surgical History  Procedure Laterality Date  . Parathyroidectomy with autotransplant  12/07/2010  . Knee surgery Right X 2  . Brachial artery graft      x2 for dialysis  . Av fistula placement Right     upper thigh  . Dental extractions Bilateral   . Dilation and curettage of uterus  1986  . Transmetatarsal amputation Left 07/15/2012    Procedure: TRANSMETATARSAL AMPUTATION;  Surgeon: Angelia Mould, MD;  Location: [facility] OR;  Service: Vascular;  Laterality: Left;  . Leg surgery Right   . Foot surgery Left     5 TOES AMPUTATED  . Abdominal aortagram N/A 05/27/2012    Procedure: ABDOMINAL Maxcine Ham;  Surgeon: Serafina Mitchell, MD;  Location[facility] CATH LAB;  Service: Cardiovascular;   Laterality: N/A;  . Lower extremity angiogram Left 05/27/2012    Procedure: LOWER EXTREMITY ANGIOGRAM;  Surgeon: Serafina Mitchell, MD;  Location: [facility] CATH LAB;  Service: Cardiovascular;  Laterality: Left;  lt leg angio  . Tooth extraction  Sep 08, 2014  . Peripheral vascular catheterization N/A 01/11/2015    Procedure: Lower Extremity Angiography;  Surgeon: Serafina Mitchell, MD;  Location: [facility] CV LAB;  Service: Cardiovascular;  Laterality: N/A;  . I&d extremity Left 05/05/2015    Procedure: DEBRIDEMENT HEEL WOUND;  Surgeon: Angelia Mould, MD;  Location: [place];  Service: Vascular;  Laterality: Left;  . Application of wound vac Left 05/05/2015    Procedure: APPLICATION OF WOUND VAC;  Surgeon: Angelia Mould, MD;  Location: [facility];  Service: Vascular;  Laterality: Left;  . Amputation Left 06/28/2015    Procedure:  Left BELOW KNEE amputation;  Surgeon: Angelia Mould, MD;  Location: [facility] OR;  Service: Vascular;  Laterality: Left;    VITAL SIGNS BP 123/66 mmHg  Pulse 102  Temp(Src) 98 F (36.7 C) (Oral)  Resp 19  Ht 5\' 4"  (1.626 m)  Wt 144 lb (65.318 kg)  BMI 24.71 kg/m2  LMP  (LMP Unknown)  Patient's Medications  New Prescriptions  No medications on file  Previous Medications   ACETAMINOPHEN (TYLENOL) 325 MG TABLET    Take 650 mg by mouth every Monday, Wednesday, and Friday. With dialysis   B COMPLEX-C-FOLIC ACID (RENA-VITE RX) 1 MG TABS    Take 1 mg by mouth daily.   CALCIUM-VITAMIN D PO    Take 500 mg by mouth 3 (three) times daily with meals.    CIPROFLOXACIN (CIPRO) 500 MG TABLET    Take 500 mg by mouth 2 (two) times daily.   CLOPIDOGREL (PLAVIX) 75 MG TABLET    Take 75 mg by mouth daily.   COLLAGENASE (SANTYL) OINTMENT    Apply 1 application topically daily. Apply daily to Left Heel  And cover with dry sterile dressing.   CYANOCOBALAMIN (VITAMIN B-12 PO)    Take 1 tablet by mouth 4 (four) times a week. Non dialysis days. Tues, Thurs, Sat and Sunday   DIAZEPAM  (VALIUM) 5 MG TABLET    Take 5 mg by mouth every 8 (eight) hours as needed for muscle spasms.    DILTIAZEM (CARDIZEM CD) 120 MG 24 HR CAPSULE    Take 1 capsule (120 mg total) by mouth daily.   DIPHENHYDRAMINE (BENADRYL) 25 MG TABLET    Take 25 mg by mouth every 6 (six) hours as needed (Muscle spasm in feet).   GLIPIZIDE (GLUCOTROL) 10 MG TABLET    Take 0.5 tablets (5 mg total) by mouth daily.   OXYCODONE (OXY IR/ROXICODONE) 5 MG IMMEDIATE RELEASE TABLET    Take 1-2 tablets (5-10 mg total) by mouth every 4 (four) hours as needed for moderate pain.   POLYETHYLENE GLYCOL (MIRALAX / GLYCOLAX) PACKET    Take 17 g by mouth daily.   TRAMADOL (ULTRAM) 50 MG TABLET    Take 1-2 tablets by mouth every 6 hours as needed for foot pain   VITAMIN C (ASCORBIC ACID) 500 MG TABLET    Take 500 mg by mouth daily.   VITAMIN E 400 UNIT CAPSULE    Take 400 Units by mouth daily.   WARFARIN (COUMADIN) 5 MG TABLET    Take 1/2 to 1 tablet by mouth daily as directed  Modified Medications   No medications on file  Discontinued Medications     SIGNIFICANT DIAGNOSTIC EXAMS   LABS REVIEWED:   06-28-15: wbc 8.5; hgb 8.8;hct 28.7;mcv 82.9; plt 419; glucose 156; bun 22; creat 6.68; k+ 3.6; na++138; liver normal albumin 2.8 06-30-15: wbc 14.4; hgb 6.9; hct 21.9; mcv 82.0; plt 369; glucose 148; bun 24; creat 7.64; k+ 3.9; na++ 134;  07-02-15: wbc 13.3; hgb 9.4; hct 29.6; mcv 81.8; plt 374    Review of Systems  Constitutional: Negative for malaise/fatigue.  Respiratory: Negative for cough and shortness of breath.   Cardiovascular: Negative for chest pain, palpitations and leg swelling.  Gastrointestinal: Negative for heartburn, abdominal pain and constipation.  Musculoskeletal: Positive for myalgias. Negative for back pain and joint pain.       Has stump pain   Skin: Negative.   Neurological: Negative for dizziness.  Psychiatric/Behavioral: The patient is not nervous/anxious.     Physical Exam  Constitutional: She is  oriented to person, place, and time. No distress.  Eyes: Conjunctivae are normal.  Neck: Neck supple. No JVD present. No thyromegaly present.  Cardiovascular: Normal rate, regular rhythm and intact distal pulses.   Respiratory: Effort normal and breath sounds normal. No respiratory distress. She has no wheezes.  GI: Soft. Bowel sounds are normal. She exhibits no distension.  There is no tenderness.  Musculoskeletal: She exhibits no edema.  Able to move all extremities  Is status post left bka Stump without signs of infection present   Lymphadenopathy:    She has no cervical adenopathy.  Neurological: She is alert and oriented to person, place, and time.  Skin: Skin is warm and dry. She is not diaphoretic.  Right thigh A/V graph + thrill + bruit   Psychiatric: She has a normal mood and affect.      ASSESSMENT/ PLAN:  1.  Diabetes: hgb a1c is 7.1; will continue glipizide 5 mg daily   2. Constipation: will continue miralax daily as needed  3. ESRD: is on hemodialysis three days per week; is followed by nephrology; will continue rena-vite daily  4. Afib: heart rate is stable; will continue cardizem cd 120 mg daily; is on long term coumadin therapy; inr is pending will monitor  5. Hypertension: will continue cardizem cd 120 mg daily   6. PVD: is status post left bka; will continue plavix 75 mg daily   7. Status post left bka: will continue therapy as directed; will continue ultram 50 or 100 mg every 6 hours as needed for pain and will continue oxycodone 5 mg every 8 hours as needed for pain; has valium 5 mg every 6 hours as needed for muscle spasms   8. Anemia of chronic disease: her hgb is 9.4. Will monitor    Time spent with patient  50  minutes >50% time spent counseling; reviewing medical record; tests; labs; and developing future plan of care     Ok Edwards NP [facility]  Contact [phone] Monday through Friday 8am- 5pm  After hours call [phone]

## 2015-07-06 DIAGNOSIS — E1129 Type 2 diabetes mellitus with other diabetic kidney complication: Secondary | ICD-10-CM | POA: Diagnosis not present

## 2015-07-06 DIAGNOSIS — N2581 Secondary hyperparathyroidism of renal origin: Secondary | ICD-10-CM | POA: Diagnosis not present

## 2015-07-06 DIAGNOSIS — D631 Anemia in chronic kidney disease: Secondary | ICD-10-CM | POA: Diagnosis not present

## 2015-07-06 DIAGNOSIS — N186 End stage renal disease: Secondary | ICD-10-CM | POA: Diagnosis not present

## 2015-07-07 ENCOUNTER — Non-Acute Institutional Stay (SKILLED_NURSING_FACILITY): Payer: Medicare Other | Admitting: Internal Medicine

## 2015-07-07 DIAGNOSIS — E1142 Type 2 diabetes mellitus with diabetic polyneuropathy: Secondary | ICD-10-CM

## 2015-07-07 DIAGNOSIS — Z7901 Long term (current) use of anticoagulants: Secondary | ICD-10-CM | POA: Diagnosis not present

## 2015-07-07 DIAGNOSIS — Z89512 Acquired absence of left leg below knee: Secondary | ICD-10-CM | POA: Diagnosis not present

## 2015-07-07 DIAGNOSIS — N186 End stage renal disease: Secondary | ICD-10-CM | POA: Diagnosis not present

## 2015-07-07 DIAGNOSIS — Z8673 Personal history of transient ischemic attack (TIA), and cerebral infarction without residual deficits: Secondary | ICD-10-CM

## 2015-07-07 DIAGNOSIS — I10 Essential (primary) hypertension: Secondary | ICD-10-CM

## 2015-07-07 DIAGNOSIS — I739 Peripheral vascular disease, unspecified: Secondary | ICD-10-CM | POA: Diagnosis not present

## 2015-07-07 DIAGNOSIS — D62 Acute posthemorrhagic anemia: Secondary | ICD-10-CM

## 2015-07-07 DIAGNOSIS — I4891 Unspecified atrial fibrillation: Secondary | ICD-10-CM | POA: Diagnosis not present

## 2015-07-07 DIAGNOSIS — Z992 Dependence on renal dialysis: Secondary | ICD-10-CM

## 2015-07-07 NOTE — Progress Notes (Signed)
MRN: [id number] Name: [name]  Sex: female Age: 55 y.o. DOB: 1960-12-07  [place] #: Karren Burly Facility/Room:130 Level Of Care: SNF Provider: Inocencio Homes D Emergency Contacts: Extended Emergency Contact Information Primary Emergency Contact: Masso,Adolphus Address: [address]          [place], [place] [address] Johnnette Litter of [place] Phone: [phone] Relation: Father Secondary Emergency Contact: Fleeta Emmer [place] of [place] Mobile Phone: [phone] Relation: Sister  Code Status:   Allergies: Doxycycline and Peroxyl  Chief Complaint  Patient presents with  . New Admit To SNF    HPI: Patient is 55 y.o. female who has undergone previous atherectomy and angioplasty of the left anterior tibial artery and left popliteal artery with a drug-coated balloon by Dr. Trula Slade on 01/11/2015. She had an excellent result from this. However, her left heel wound was quite extensive and I did not think this was salvageable. We have discussed below the knee amputation on multiple occasions and she was against this. Therefore, on 05/05/2015, she underwent excisional debridement of the left heel with placement of a VAC. She comes in today as she is now agreeable to consider left below the knee amputation. She has had significant pain in the left heel. Pt was admitted to [facility] from 3/7-10 where she underwent a L BKA. Hospital course was complicated by ABLA requiring tx of 2 units PRBC. Pt is admitted to SNF for OT/PT. While at SNF pt will be followed for AF, tx with diltiazem and warfarin, DM2, tx withglipizide and PVD, tx with plavix.  Past Medical History  Diagnosis Date  . Hypertension   . CVA (cerebral infarction)     right parietal 05/19/2000  . Vaginal bleeding   . Hyperparathyroidism   . Pneumonia   . Stroke ([place])     2002,no residual  . Peripheral vascular disease ([place])   . GERD (gastroesophageal reflux disease)   . DM (diabetes mellitus) type II controlled  peripheral vascular disorder ([place])   . Slip/trip w/o falling due to stepping on object, subs July  2014  . ESRD (end stage renal disease) ([place])     dialysis M/W/F  . Constipation   . Complication of anesthesia     Morphine makes her nauseated, states she has small veins  . Atrial fibrillation ([place])   . Anemia     Past Surgical History  Procedure Laterality Date  . Parathyroidectomy with autotransplant  12/07/2010  . Knee surgery Right X 2  . Brachial artery graft      x2 for dialysis  . Av fistula placement Right     upper thigh  . Dental extractions Bilateral   . Dilation and curettage of uterus  1986  . Transmetatarsal amputation Left 07/15/2012    Procedure: TRANSMETATARSAL AMPUTATION;  Surgeon: Angelia Mould, MD;  Location: [facility] OR;  Service: Vascular;  Laterality: Left;  . Leg surgery Right   . Foot surgery Left     5 TOES AMPUTATED  . Abdominal aortagram N/A 05/27/2012    Procedure: ABDOMINAL Maxcine Ham;  Surgeon: Serafina Mitchell, MD;  Location: [facility] CATH LAB;  Service: Cardiovascular;  Laterality: N/A;  . Lower extremity angiogram Left 05/27/2012    Procedure: LOWER EXTREMITY ANGIOGRAM;  Surgeon: Serafina Mitchell, MD;  Location: [facility] CATH LAB;  Service: Cardiovascular;  Laterality: Left;  lt leg angio  . Tooth extraction  Sep 08, 2014  . Peripheral vascular catheterization N/A 01/11/2015    Procedure: Lower Extremity Angiography;  Surgeon: Serafina Mitchell,  MD;  Location: [place] CV LAB;  Service: Cardiovascular;  Laterality: N/A;  . I&d extremity Left 05/05/2015    Procedure: DEBRIDEMENT HEEL WOUND;  Surgeon: Angelia Mould, MD;  Location: [place];  Service: Vascular;  Laterality: Left;  . Application of wound vac Left 05/05/2015    Procedure: APPLICATION OF WOUND VAC;  Surgeon: Angelia Mould, MD;  Location: [place];  Service: Vascular;  Laterality: Left;  . Amputation Left 06/28/2015    Procedure:  Left BELOW KNEE amputation;  Surgeon: Angelia Mould, MD;   Location: [place];  Service: Vascular;  Laterality: Left;      Medication List       This list is accurate as of: 07/07/15 11:59 PM.  Always use your most recent med list.               acetaminophen 325 MG tablet  Commonly known as:  TYLENOL  Take 650 mg by mouth every Monday, Wednesday, and Friday. With dialysis     CALCIUM-VITAMIN D PO  Take 500 mg by mouth 3 (three) times daily with meals.     ciprofloxacin 500 MG tablet  Commonly known as:  CIPRO  Take 500 mg by mouth 2 (two) times daily.     clopidogrel 75 MG tablet  Commonly known as:  PLAVIX  Take 75 mg by mouth daily.     collagenase ointment  Commonly known as:  SANTYL  Apply 1 application topically daily. Apply daily to Left Heel  And cover with dry sterile dressing.     diazepam 5 MG tablet  Commonly known as:  VALIUM  Take 5 mg by mouth every 8 (eight) hours as needed for muscle spasms.     diltiazem 120 MG 24 hr capsule  Commonly known as:  CARDIZEM CD  Take 1 capsule (120 mg total) by mouth daily.     diphenhydrAMINE 25 MG tablet  Commonly known as:  BENADRYL  Take 25 mg by mouth every 6 (six) hours as needed (Muscle spasm in feet).     glipiZIDE 10 MG tablet  Commonly known as:  GLUCOTROL  Take 0.5 tablets (5 mg total) by mouth daily.     oxyCODONE 5 MG immediate release tablet  Commonly known as:  Oxy IR/ROXICODONE  Take 1-2 tablets (5-10 mg total) by mouth every 4 (four) hours as needed for moderate pain.     polyethylene glycol packet  Commonly known as:  MIRALAX / GLYCOLAX  Take 17 g by mouth daily.     RENA-VITE RX 1 MG Tabs  Take 1 mg by mouth daily.     traMADol 50 MG tablet  Commonly known as:  ULTRAM  Take 1-2 tablets by mouth every 6 hours as needed for foot pain     VITAMIN B-12 PO  Take 1 tablet by mouth 4 (four) times a week. Non dialysis days. Tues, Thurs, Sat and Sunday     vitamin C 500 MG tablet  Commonly known as:  ASCORBIC ACID  Take 500 mg by mouth daily.      vitamin E 400 UNIT capsule  Take 400 Units by mouth daily.     warfarin 5 MG tablet  Commonly known as:  COUMADIN  Take 1/2 to 1 tablet by mouth daily as directed        No orders of the defined types were placed in this encounter.    Immunization History  Administered Date(s) Administered  . Influenza,inj,Quad PF,36+ Mos  01/19/2014    Social History  Substance Use Topics  . Smoking status: Never Smoker   . Smokeless tobacco: Never Used  . Alcohol Use: No    Family history is + HTN  Review of Systems  DATA OBTAINED: from patient, nurse GENERAL:  no fevers, fatigue, appetite changes SKIN: No itching, rash or wounds EYES: No eye pain, redness, discharge EARS: No earache, tinnitus, change in hearing NOSE: No congestion, drainage or bleeding  MOUTH/THROAT: No mouth or tooth pain, No sore throat RESPIRATORY: No cough, wheezing, SOB CARDIAC: No chest pain, palpitations, lower extremity edema  GI: No abdominal pain, No N/V/D or constipation, No heartburn or reflux  GU: No dysuria, frequency or urgency, or incontinence  MUSCULOSKELETAL: No unrelieved bone/joint pain NEUROLOGIC: No headache, dizziness or focal weakness PSYCHIATRIC: No c/o anxiety or sadness   Filed Vitals:   07/07/15 1344  BP: 123/66  Pulse: 102  Temp: 98 F (36.7 C)  Resp: 19    SpO2 Readings from Last 1 Encounters:  07/02/15 100%        Physical Exam  GENERAL APPEARANCE: Alert, conversant,  No acute distress.  SKIN: No diaphoresis rash HEAD: Normocephalic, atraumatic  EYES: Conjunctiva/lids clear. Pupils round, reactive. EOMs intact.  EARS: External exam WNL, canals clear. Hearing grossly normal.  NOSE: No deformity or discharge.  MOUTH/THROAT: Lips w/o lesions  RESPIRATORY: Breathing is even, unlabored. Lung sounds are clear   CARDIOVASCULAR: Heart RRR no murmurs, rubs or gallops. No peripheral edema.   GASTROINTESTINAL: Abdomen is soft, non-tender, not distended w/ normal bowel  sounds. GENITOURINARY: Bladder non tender, not distended  MUSCULOSKELETAL:  L BKA , dressed , no heat or redness to surrounding area. NEUROLOGIC:  Cranial nerves 2-12 grossly intact. Moves all extremities  PSYCHIATRIC: Mood and affect appropriate to situation, no behavioral issues  Patient Active Problem List   Diagnosis Date Noted  . Atrial fibrillation ([place]) 07/22/2015  . Anemia   . S/P BKA (below knee amputation) unilateral ([place])   . Abnormality of gait   . Paroxysmal atrial fibrillation (H[facility])   . History of CVA (cerebrovascular accident)   . Type 2 diabetes mellitus with peripheral neuropathy (H[facility])   . ESRD on dialysis ([place])   . Postoperative pain   . SIRS (systemic inflammatory response syndrome) (H[facility])   . Essential hypertension   . Acute blood loss anemia   . Anemia of chronic disease   . Nonhealing nonsurgical wound 05/05/2015  . Chronic anticoagulation - Coumadin, CHADS2VASC=5 04/21/2015  . PAF (paroxysmal atrial fibrillation) ([place]) 04/21/2015  . Atrial fibrillation with RVR ([place]) 03/25/2015  . Atrial fibrillation with rapid ventricular response ([place]) 03/25/2015  . PAD (peripheral artery disease) ([place]) 01/11/2015  . Pleural effusion, right 08/16/2014  . Abdominal pain 08/16/2014  . Chest pain   . Cholecystitis   . Hypoxia   . 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 Ulcer of lower limb ([place]) 09/09/2013  . Pain in limb-left foot 11/19/2012  . End stage renal disease on dialysis ([place])   . DM (diabetes mellitus) type II controlled peripheral vascular disorder ([place])   . Peripheral vascular disease, unspecified ([place]) 07/02/2012  . Atherosclerosis of native arteries of the extremities with gangrene ([place]) 05/21/2012  . Hyperparathyroidism, secondary ([place]) 12/28/2010    CBC    Component Value Date/Time   WBC 13.3* 07/02/2015 0658   RBC 3.62* 07/02/2015 0658   HGB 9.4* 07/02/2015 0658   HCT 29.6* 07/02/2015 0658   PLT 374 07/02/2015 0658   MCV 81.8  07/02/2015 0658   LYMPHSABS  1.5 03/25/2015 1125   MONOABS 0.8 03/25/2015 1125   EOSABS 0.1 03/25/2015 1125   BASOSABS 0.0 03/25/2015 1125    CMP     Component Value Date/Time   NA 133* 07/01/2015 0659   K 3.8 07/01/2015 0659   CL 94* 07/01/2015 0659   CO2 24 07/01/2015 0659   GLUCOSE 168* 07/01/2015 0659   BUN 27* 07/01/2015 0659   CREATININE 8.36* 07/01/2015 0659   CALCIUM 8.4* 07/01/2015 0659   PROT 7.9 06/28/2015 0829   ALBUMIN 2.1* 07/01/2015 0659   AST 20 06/28/2015 0829   ALT 15 06/28/2015 0829   ALKPHOS 54 06/28/2015 0829   BILITOT 0.7 06/28/2015 0829   GFRNONAA 5* 07/01/2015 0659   GFRAA 6* 07/01/2015 0659    Lab Results  Component Value Date   HGBA1C 7.7* 06/28/2015     No results found.  Not all labs, radiology exams or other studies done during hospitalization come through on my EPIC note; however they are reviewed by me.    Assessment and Plan  S/P BKA (below knee amputation) unilateral (HCC) SNF - for non healing wound to L heel failing multiple interventions 2/2 to PVD; admitted to SNF for OT/PT  Peripheral vascular disease, unspecified ([place]) SNF - cont plavix  Essential hypertension SNF - controlled on diltiazem 120 mg daily  Type 2 diabetes mellitus with peripheral neuropathy (HCC) SNF - A1c 7.7 on glucatrol 5 mg daily; continue  End stage renal disease on dialysis ([place]) SNF - M,W,F; cont renavite  History of CVA (cerebrovascular accident) SNF - cont plavix  Chronic anticoagulation - Coumadin, CHADS2VASC=5 SNF - will actively titrate coumadin  Acute blood loss anemia SNF - post surg low 7.8;post transfusion HB 9.4; will f/u CBC   Time spent . 45 min;> 50% of time with patient was spent reviewing records, labs, tests and studies, counseling and developing plan of care  Hennie Duos, MD

## 2015-07-08 DIAGNOSIS — E1129 Type 2 diabetes mellitus with other diabetic kidney complication: Secondary | ICD-10-CM | POA: Diagnosis not present

## 2015-07-08 DIAGNOSIS — D631 Anemia in chronic kidney disease: Secondary | ICD-10-CM | POA: Diagnosis not present

## 2015-07-08 DIAGNOSIS — N186 End stage renal disease: Secondary | ICD-10-CM | POA: Diagnosis not present

## 2015-07-08 DIAGNOSIS — N2581 Secondary hyperparathyroidism of renal origin: Secondary | ICD-10-CM | POA: Diagnosis not present

## 2015-07-08 LAB — PROTIME-INR: INR: 1.4 — AB (ref 0.9–1.1)

## 2015-07-11 DIAGNOSIS — D631 Anemia in chronic kidney disease: Secondary | ICD-10-CM | POA: Diagnosis not present

## 2015-07-11 DIAGNOSIS — N186 End stage renal disease: Secondary | ICD-10-CM | POA: Diagnosis not present

## 2015-07-11 DIAGNOSIS — N2581 Secondary hyperparathyroidism of renal origin: Secondary | ICD-10-CM | POA: Diagnosis not present

## 2015-07-11 DIAGNOSIS — E1129 Type 2 diabetes mellitus with other diabetic kidney complication: Secondary | ICD-10-CM | POA: Diagnosis not present

## 2015-07-12 ENCOUNTER — Ambulatory Visit (INDEPENDENT_AMBULATORY_CARE_PROVIDER_SITE_OTHER): Payer: Medicare Other | Admitting: Pharmacist Clinician (PhC)/ Clinical Pharmacy Specialist

## 2015-07-12 DIAGNOSIS — I4891 Unspecified atrial fibrillation: Secondary | ICD-10-CM

## 2015-07-13 ENCOUNTER — Telehealth: Payer: Self-pay | Admitting: Cardiology

## 2015-07-13 DIAGNOSIS — N2581 Secondary hyperparathyroidism of renal origin: Secondary | ICD-10-CM | POA: Diagnosis not present

## 2015-07-13 DIAGNOSIS — N186 End stage renal disease: Secondary | ICD-10-CM | POA: Diagnosis not present

## 2015-07-13 DIAGNOSIS — D631 Anemia in chronic kidney disease: Secondary | ICD-10-CM | POA: Diagnosis not present

## 2015-07-13 DIAGNOSIS — E1129 Type 2 diabetes mellitus with other diabetic kidney complication: Secondary | ICD-10-CM | POA: Diagnosis not present

## 2015-07-13 NOTE — Telephone Encounter (Signed)
New message     FYI Calling to let Erasmo Downer know that [facility]'s [facility].

## 2015-07-14 ENCOUNTER — Ambulatory Visit: Payer: Self-pay | Admitting: Pharmacist Clinician (PhC)/ Clinical Pharmacy Specialist

## 2015-07-14 DIAGNOSIS — I4891 Unspecified atrial fibrillation: Secondary | ICD-10-CM

## 2015-07-15 DIAGNOSIS — D631 Anemia in chronic kidney disease: Secondary | ICD-10-CM | POA: Diagnosis not present

## 2015-07-15 DIAGNOSIS — N2581 Secondary hyperparathyroidism of renal origin: Secondary | ICD-10-CM | POA: Diagnosis not present

## 2015-07-15 DIAGNOSIS — N186 End stage renal disease: Secondary | ICD-10-CM | POA: Diagnosis not present

## 2015-07-15 DIAGNOSIS — E1129 Type 2 diabetes mellitus with other diabetic kidney complication: Secondary | ICD-10-CM | POA: Diagnosis not present

## 2015-07-18 DIAGNOSIS — D631 Anemia in chronic kidney disease: Secondary | ICD-10-CM | POA: Diagnosis not present

## 2015-07-18 DIAGNOSIS — E1129 Type 2 diabetes mellitus with other diabetic kidney complication: Secondary | ICD-10-CM | POA: Diagnosis not present

## 2015-07-18 DIAGNOSIS — N186 End stage renal disease: Secondary | ICD-10-CM | POA: Diagnosis not present

## 2015-07-18 DIAGNOSIS — N2581 Secondary hyperparathyroidism of renal origin: Secondary | ICD-10-CM | POA: Diagnosis not present

## 2015-07-20 DIAGNOSIS — N186 End stage renal disease: Secondary | ICD-10-CM | POA: Diagnosis not present

## 2015-07-20 DIAGNOSIS — N2581 Secondary hyperparathyroidism of renal origin: Secondary | ICD-10-CM | POA: Diagnosis not present

## 2015-07-20 DIAGNOSIS — D631 Anemia in chronic kidney disease: Secondary | ICD-10-CM | POA: Diagnosis not present

## 2015-07-20 DIAGNOSIS — E1129 Type 2 diabetes mellitus with other diabetic kidney complication: Secondary | ICD-10-CM | POA: Diagnosis not present

## 2015-07-22 ENCOUNTER — Encounter: Payer: Self-pay | Admitting: Internal Medicine

## 2015-07-22 DIAGNOSIS — I4891 Unspecified atrial fibrillation: Secondary | ICD-10-CM | POA: Insufficient documentation

## 2015-07-22 DIAGNOSIS — N186 End stage renal disease: Secondary | ICD-10-CM | POA: Diagnosis not present

## 2015-07-22 DIAGNOSIS — D649 Anemia, unspecified: Secondary | ICD-10-CM | POA: Insufficient documentation

## 2015-07-22 DIAGNOSIS — D631 Anemia in chronic kidney disease: Secondary | ICD-10-CM | POA: Diagnosis not present

## 2015-07-22 DIAGNOSIS — N2581 Secondary hyperparathyroidism of renal origin: Secondary | ICD-10-CM | POA: Diagnosis not present

## 2015-07-22 DIAGNOSIS — Z992 Dependence on renal dialysis: Secondary | ICD-10-CM | POA: Diagnosis not present

## 2015-07-22 DIAGNOSIS — E1129 Type 2 diabetes mellitus with other diabetic kidney complication: Secondary | ICD-10-CM | POA: Diagnosis not present

## 2015-07-24 ENCOUNTER — Encounter: Payer: Self-pay | Admitting: Internal Medicine

## 2015-07-24 NOTE — Assessment & Plan Note (Signed)
SNF - cont plavix

## 2015-07-24 NOTE — Assessment & Plan Note (Signed)
SNF - for non healing wound to L heel failing multiple interventions 2/2 to PVD; admitted to SNF for OT/PT

## 2015-07-24 NOTE — Assessment & Plan Note (Signed)
SNF - M,W,F; cont renavite

## 2015-07-24 NOTE — Assessment & Plan Note (Signed)
SNF - post surg low 7.8;post transfusion HB 9.4; will f/u CBC

## 2015-07-24 NOTE — Assessment & Plan Note (Signed)
SNF - A1c 7.7 on glucatrol 5 mg daily; continue

## 2015-07-24 NOTE — Assessment & Plan Note (Signed)
SNF - will actively titrate coumadin

## 2015-07-24 NOTE — Assessment & Plan Note (Signed)
SNF - controlled on diltiazem 120 mg daily

## 2015-07-25 DIAGNOSIS — E1129 Type 2 diabetes mellitus with other diabetic kidney complication: Secondary | ICD-10-CM | POA: Diagnosis not present

## 2015-07-25 DIAGNOSIS — N2581 Secondary hyperparathyroidism of renal origin: Secondary | ICD-10-CM | POA: Diagnosis not present

## 2015-07-25 DIAGNOSIS — D631 Anemia in chronic kidney disease: Secondary | ICD-10-CM | POA: Diagnosis not present

## 2015-07-25 DIAGNOSIS — N186 End stage renal disease: Secondary | ICD-10-CM | POA: Diagnosis not present

## 2015-07-27 ENCOUNTER — Encounter (HOSPITAL_COMMUNITY): Payer: Self-pay | Admitting: Emergency Medicine

## 2015-07-27 ENCOUNTER — Emergency Department (HOSPITAL_COMMUNITY)
Admission: EM | Admit: 2015-07-27 | Discharge: 2015-07-27 | Disposition: A | Discharge status: Home / Self Care | Payer: Medicare Other | Attending: Emergency Medicine | Admitting: Emergency Medicine

## 2015-07-27 DIAGNOSIS — T82838A Hemorrhage of vascular prosthetic devices, implants and grafts, initial encounter: Secondary | ICD-10-CM | POA: Insufficient documentation

## 2015-07-27 DIAGNOSIS — T829XXA Unspecified complication of cardiac and vascular prosthetic device, implant and graft, initial encounter: Secondary | ICD-10-CM

## 2015-07-27 DIAGNOSIS — I4891 Unspecified atrial fibrillation: Secondary | ICD-10-CM | POA: Insufficient documentation

## 2015-07-27 DIAGNOSIS — I120 Hypertensive chronic kidney disease with stage 5 chronic kidney disease or end stage renal disease: Secondary | ICD-10-CM | POA: Insufficient documentation

## 2015-07-27 DIAGNOSIS — Z792 Long term (current) use of antibiotics: Secondary | ICD-10-CM | POA: Insufficient documentation

## 2015-07-27 DIAGNOSIS — Z992 Dependence on renal dialysis: Secondary | ICD-10-CM | POA: Insufficient documentation

## 2015-07-27 DIAGNOSIS — K5900 Constipation, unspecified: Secondary | ICD-10-CM | POA: Insufficient documentation

## 2015-07-27 DIAGNOSIS — Z79899 Other long term (current) drug therapy: Secondary | ICD-10-CM | POA: Insufficient documentation

## 2015-07-27 DIAGNOSIS — Z7901 Long term (current) use of anticoagulants: Secondary | ICD-10-CM | POA: Insufficient documentation

## 2015-07-27 DIAGNOSIS — Z89522 Acquired absence of left knee: Secondary | ICD-10-CM | POA: Insufficient documentation

## 2015-07-27 DIAGNOSIS — E1151 Type 2 diabetes mellitus with diabetic peripheral angiopathy without gangrene: Secondary | ICD-10-CM | POA: Insufficient documentation

## 2015-07-27 DIAGNOSIS — D649 Anemia, unspecified: Secondary | ICD-10-CM | POA: Insufficient documentation

## 2015-07-27 DIAGNOSIS — N186 End stage renal disease: Secondary | ICD-10-CM | POA: Insufficient documentation

## 2015-07-27 DIAGNOSIS — Z8701 Personal history of pneumonia (recurrent): Secondary | ICD-10-CM | POA: Insufficient documentation

## 2015-07-27 DIAGNOSIS — E1129 Type 2 diabetes mellitus with other diabetic kidney complication: Secondary | ICD-10-CM | POA: Diagnosis not present

## 2015-07-27 DIAGNOSIS — Z9889 Other specified postprocedural states: Secondary | ICD-10-CM | POA: Insufficient documentation

## 2015-07-27 DIAGNOSIS — D631 Anemia in chronic kidney disease: Secondary | ICD-10-CM | POA: Diagnosis not present

## 2015-07-27 DIAGNOSIS — Y658 Other specified misadventures during surgical and medical care: Secondary | ICD-10-CM | POA: Insufficient documentation

## 2015-07-27 DIAGNOSIS — Z8673 Personal history of transient ischemic attack (TIA), and cerebral infarction without residual deficits: Secondary | ICD-10-CM | POA: Insufficient documentation

## 2015-07-27 DIAGNOSIS — N2581 Secondary hyperparathyroidism of renal origin: Secondary | ICD-10-CM | POA: Diagnosis not present

## 2015-07-27 DIAGNOSIS — Z87828 Personal history of other (healed) physical injury and trauma: Secondary | ICD-10-CM | POA: Insufficient documentation

## 2015-07-27 LAB — CBC WITH DIFFERENTIAL/PLATELET
BASOS ABS: 0.1 10*3/uL (ref 0.0–0.1)
BASOS PCT: 1 %
EOS PCT: 9 %
Eosinophils Absolute: 0.6 10*3/uL (ref 0.0–0.7)
HCT: 41.1 % (ref 36.0–46.0)
Hemoglobin: 12.5 g/dL (ref 12.0–15.0)
LYMPHS PCT: 29 %
Lymphs Abs: 1.9 10*3/uL (ref 0.7–4.0)
MCH: 26.2 pg (ref 26.0–34.0)
MCHC: 30.4 g/dL (ref 30.0–36.0)
MCV: 86.2 fL (ref 78.0–100.0)
MONO ABS: 0.5 10*3/uL (ref 0.1–1.0)
Monocytes Relative: 7 %
NEUTROS ABS: 3.5 10*3/uL (ref 1.7–7.7)
Neutrophils Relative %: 54 %
PLATELETS: 246 10*3/uL (ref 150–400)
RBC: 4.77 MIL/uL (ref 3.87–5.11)
RDW: 18.7 % — AB (ref 11.5–15.5)
WBC: 6.5 10*3/uL (ref 4.0–10.5)

## 2015-07-27 LAB — PROTIME-INR
INR: 2.7 — ABNORMAL HIGH (ref 0.00–1.49)
Prothrombin Time: 28.3 seconds — ABNORMAL HIGH (ref 11.6–15.2)

## 2015-07-27 LAB — BASIC METABOLIC PANEL
Anion gap: 16 — ABNORMAL HIGH (ref 5–15)
BUN: 17 mg/dL (ref 6–20)
CALCIUM: 9.1 mg/dL (ref 8.9–10.3)
CO2: 27 mmol/L (ref 22–32)
Chloride: 92 mmol/L — ABNORMAL LOW (ref 101–111)
Creatinine, Ser: 5.17 mg/dL — ABNORMAL HIGH (ref 0.44–1.00)
GFR calc Af Amer: 10 mL/min — ABNORMAL LOW (ref >60)
GFR, EST NON AFRICAN AMERICAN: 9 mL/min — AB (ref >60)
Glucose, Bld: 119 mg/dL — ABNORMAL HIGH (ref 65–99)
POTASSIUM: 4.7 mmol/L (ref 3.5–5.1)
Sodium: 135 mmol/L (ref 135–145)

## 2015-07-27 LAB — APTT: APTT: 50 s — AB (ref 24–37)

## 2015-07-27 MED ORDER — LIDOCAINE HCL (PF) 1 % IJ SOLN
20.0000 mL | Freq: Once | INTRAMUSCULAR | Status: DC
  Filled 2015-07-27: qty 20

## 2015-07-27 MED ORDER — SODIUM CHLORIDE 0.9 % IV BOLUS (SEPSIS)
1000.0000 mL | Freq: Once | INTRAVENOUS | Status: AC
  Administered 2015-07-27: 1000 mL via INTRAVENOUS

## 2015-07-27 NOTE — ED Notes (Signed)
Bleeding has stopped on First site. Needle remains in place, per [facility]. Vascular surgeon will remove second needle

## 2015-07-27 NOTE — ED Notes (Signed)
Needle removed by vascular surgeon and sutures applied. No bleeding noted for 37minutes after sutures and pressure. Pressure dressing applied. Pt ready for discharge. Called [facility] nurse and updated on situation.

## 2015-07-27 NOTE — ED Notes (Signed)
Dr. Kellie Simmering and Gwyndolyn Saxon PA at bedside

## 2015-07-27 NOTE — ED Notes (Signed)
Phlebotomy at bedside. Still holding pressure

## 2015-07-27 NOTE — ED Provider Notes (Signed)
CSN: [id number]     Arrival date & time 07/27/15  1227 History   First MD Initiated Contact with Patient 07/27/15 1231     Chief Complaint  Patient presents with  . Vascular Access Problem    [name] is a 55 y.o. female with ESRD on dialysis MWF, on coumadin for A-fib who presents to the ED from dialysis clinic with a bleeding dialysis shunt. The patient completed dialysis today when they removed the dialysis needle she began having bleeding from her right upper thigh dialysis shunt. This started around 10:30 am today and they were unable to control the bleeding. Patient is still having bleeding despite pressure being held to her right upper thigh. Patient is on Coumadin for atrial fibrillation. Her last INR was 1.4 one week ago. The patient believes that the nursing facility has been providing her with a higher dose of Coumadin recently. Patient also receives Plavix and is on heparin during dialysis. She denies any easy bleeding or bruising otherwise. Patient has no complaints. She denies fevers, lightheadedness, dizziness, chest pain, SOB, or hematuria.   The history is provided by the patient and the EMS personnel. No language interpreter was used.    Past Medical History  Diagnosis Date  . Hypertension   . CVA (cerebral infarction)     right parietal 05/19/2000  . Vaginal bleeding   . Hyperparathyroidism   . Pneumonia   . Stroke ([place])     2002,no residual  . Peripheral vascular disease ([place])   . GERD (gastroesophageal reflux disease)   . DM (diabetes mellitus) type II controlled peripheral vascular disorder ([place])   . Slip/trip w/o falling due to stepping on object, subs July  2014  . ESRD (end stage renal disease) ([place])     dialysis M/W/F  . Constipation   . Complication of anesthesia     Morphine makes her nauseated, states she has small veins  . Atrial fibrillation ([place])   . Anemia    Past Surgical History  Procedure Laterality Date  . Parathyroidectomy with  autotransplant  12/07/2010  . Knee surgery Right X 2  . Brachial artery graft      x2 for dialysis  . Av fistula placement Right     upper thigh  . Dental extractions Bilateral   . Dilation and curettage of uterus  1986  . Transmetatarsal amputation Left 07/15/2012    Procedure: TRANSMETATARSAL AMPUTATION;  Surgeon: Angelia Mould, MD;  Location: [facility] OR;  Service: Vascular;  Laterality: Left;  . Leg surgery Right   . Foot surgery Left     5 TOES AMPUTATED  . Abdominal aortagram N/A 05/27/2012    Procedure: ABDOMINAL Maxcine Ham;  Surgeon: Serafina Mitchell, MD;  Location: [facility] CATH LAB;  Service: Cardiovascular;  Laterality: N/A;  . Lower extremity angiogram Left 05/27/2012    Procedure: LOWER EXTREMITY ANGIOGRAM;  Surgeon: Serafina Mitchell, MD;  Location: [facility] CATH LAB;  Service: Cardiovascular;  Laterality: Left;  lt leg angio  . Tooth extraction  Sep 08, 2014  . Peripheral vascular catheterization N/A 01/11/2015    Procedure: Lower Extremity Angiography;  Surgeon: Serafina Mitchell, MD;  Location: [facility] CV LAB;  Service: Cardiovascular;  Laterality: N/A;  . I&d extremity Left 05/05/2015    Procedure: DEBRIDEMENT HEEL WOUND;  Surgeon: Angelia Mould, MD;  Location: [facility];  Service: Vascular;  Laterality: Left;  . Application of wound vac Left 05/05/2015    Procedure: APPLICATION OF WOUND VAC;  Surgeon:  Angelia Mould, MD;  Location: [place];  Service: Vascular;  Laterality: Left;  . Amputation Left 06/28/2015    Procedure:  Left BELOW KNEE amputation;  Surgeon: Angelia Mould, MD;  Location: [facility] OR;  Service: Vascular;  Laterality: Left;   Family History  Problem Relation Age of Onset  . Hypertension Father   . Kidney disease Mother    Social History  Substance Use Topics  . Smoking status: Never Smoker   . Smokeless tobacco: Never Used  . Alcohol Use: No   OB History    No data available     Review of Systems  Constitutional: Negative for fever and chills.   HENT: Negative for congestion and sore throat.   Eyes: Negative for visual disturbance.  Respiratory: Negative for cough and shortness of breath.   Cardiovascular: Negative for chest pain.  Gastrointestinal: Negative for nausea, vomiting, abdominal pain and diarrhea.  Genitourinary: Negative for dysuria and hematuria.  Musculoskeletal: Negative for back pain and neck pain.  Skin: Negative for rash.       Bleeding from shunt   Neurological: Negative for dizziness, light-headedness and headaches.      Allergies  Doxycycline and Peroxyl  Home Medications   Prior to Admission medications   Medication Sig Start Date End Date Taking? Authorizing Provider  acetaminophen (TYLENOL) 325 MG tablet Take 650 mg by mouth every Monday, Wednesday, and Friday. With dialysis    Historical Provider, MD  B Complex-C-Folic Acid (RENA-VITE RX) 1 MG TABS Take 1 mg by mouth daily. 05/26/15   Historical Provider, MD  CALCIUM-VITAMIN D PO Take 500 mg by mouth 3 (three) times daily with meals.     Historical Provider, MD  ciprofloxacin (CIPRO) 500 MG tablet Take 500 mg by mouth 2 (two) times daily.    Historical Provider, MD  clopidogrel (PLAVIX) 75 MG tablet Take 75 mg by mouth daily. 06/16/15   Historical Provider, MD  collagenase (SANTYL) ointment Apply 1 application topically daily. Apply daily to Left Heel  And cover with dry sterile dressing. 06/02/15   Angelia Mould, MD  Cyanocobalamin (VITAMIN B-12 PO) Take 1 tablet by mouth 4 (four) times a week. Non dialysis days. Cain Saupe, Sat and Sunday    Historical Provider, MD  diazepam (VALIUM) 5 MG tablet Take 5 mg by mouth every 8 (eight) hours as needed for muscle spasms.  11/15/14   Historical Provider, MD  diltiazem (CARDIZEM CD) 120 MG 24 hr capsule Take 1 capsule (120 mg total) by mouth daily. 03/27/15   Shanker Kristeen Mans, MD  diphenhydrAMINE (BENADRYL) 25 MG tablet Take 25 mg by mouth every 6 (six) hours as needed (Muscle spasm in feet).     Historical Provider, MD  glipiZIDE (GLUCOTROL) 10 MG tablet Take 0.5 tablets (5 mg total) by mouth daily. 11/23/14   Lance Bosch, NP  oxyCODONE (OXY IR/ROXICODONE) 5 MG immediate release tablet Take 1-2 tablets (5-10 mg total) by mouth every 4 (four) hours as needed for moderate pain. 07/02/15   Ulyses Amor, PA-C  polyethylene glycol (MIRALAX / GLYCOLAX) packet Take 17 g by mouth daily. Patient taking differently: Take 17 g by mouth daily as needed for mild constipation.  08/19/14   Reyne Dumas, MD  traMADol (ULTRAM) 50 MG tablet Take 1-2 tablets by mouth every 6 hours as needed for foot pain 05/16/15   Sharmon Leyden Nickel, NP  vitamin C (ASCORBIC ACID) 500 MG tablet Take 500 mg by mouth daily.  Historical Provider, MD  vitamin E 400 UNIT capsule Take 400 Units by mouth daily.    Historical Provider, MD  warfarin (COUMADIN) 5 MG tablet Take 1/2 to 1 tablet by mouth daily as directed 06/23/15   Leonie Man, MD   BP 124/77 mmHg  Pulse 110  Temp(Src) 98.3 F (36.8 C) (Oral)  Resp 18  SpO2 93%  LMP  (LMP Unknown) Physical Exam  Constitutional: She appears well-developed and well-nourished. No distress.  HENT:  Head: Normocephalic and atraumatic.  Mouth/Throat: Oropharynx is clear and moist.  Eyes: Conjunctivae are normal. Pupils are equal, round, and reactive to light. Right eye exhibits no discharge. Left eye exhibits no discharge.  Neck: Neck supple.  Cardiovascular: Regular rhythm, normal heart sounds and intact distal pulses.  Exam reveals no gallop and no friction rub.   No murmur heard. HR 106. Patient's right posterior tibialis pulses intact. Good capillary refill.   Pulmonary/Chest: Effort normal and breath sounds normal. No respiratory distress. She has no wheezes. She has no rales.  Abdominal: Soft. There is no tenderness.  Musculoskeletal: She exhibits no edema.  Gentle pulsatile bleeding from right upper thigh dialysis shunt site. Pressure being held by nursing student.  Second needle site in place to medial aspect of right upper thigh. No bleeding noted from second site.  Left below the knee amputation.    Lymphadenopathy:    She has no cervical adenopathy.  Neurological: She is alert. Coordination normal.  Skin: Skin is warm and dry. No rash noted. She is not diaphoretic. No erythema. No pallor.  Psychiatric: She has a normal mood and affect. Her behavior is normal.  Nursing note and vitals reviewed.   ED Course  Procedures (including critical care time) Labs Review Labs Reviewed  BASIC METABOLIC PANEL - Abnormal; Notable for the following:    Chloride 92 (*)    Glucose, Bld 119 (*)    Creatinine, Ser 5.17 (*)    GFR calc non Af Amer 9 (*)    GFR calc Af Amer 10 (*)    Anion gap 16 (*)    All other components within normal limits  CBC WITH DIFFERENTIAL/PLATELET - Abnormal; Notable for the following:    RDW 18.7 (*)    All other components within normal limits  PROTIME-INR - Abnormal; Notable for the following:    Prothrombin Time 28.3 (*)    INR 2.70 (*)    All other components within normal limits  APTT - Abnormal; Notable for the following:    aPTT 50 (*)    All other components within normal limits    Imaging Review No results found. I have personally reviewed and evaluated these lab results as part of my medical decision-making.   EKG Interpretation None      Filed Vitals:   07/27/15 1507 07/27/15 1515 07/27/15 1545 07/27/15 1616  BP: 149/78 133/73 141/72 124/77  Pulse: 96 99 98 110  Temp:    98.3 F (36.8 C)  TempSrc:    Oral  Resp:  18 18   SpO2: 100% 100% 100% 93%     MDM   Meds given in ED:  Medications  lidocaine (PF) (XYLOCAINE) 1 % injection 20 mL (not administered)  sodium chloride 0.9 % bolus 1,000 mL (0 mLs Intravenous Stopped 07/27/15 1446)    New Prescriptions   No medications on file    Final diagnoses:  Complications due to renal dialysis device, implant, and graft, initial encounter ([place])   This  is a 55 y.o. female with ESRD on dialysis MWF, on coumadin for A-fib who presents to the ED from dialysis clinic with a bleeding dialysis shunt. The patient completed dialysis today when they removed the dialysis needle she began having bleeding from her right upper thigh dialysis shunt. This started around 10:30 am today and they were unable to control the bleeding. Patient is still having bleeding despite pressure being held to her right upper thigh. Patient is on Coumadin for atrial fibrillation. Her last INR was 1.4 one week ago. The patient believes that the nursing facility has been providing her with a higher dose of Coumadin recently. On exam the patient is afebrile nontoxic appearing. Patient has good gentle pulsatile flow to her right upper dialysis shunt site. Pressure is being held by nursing student. There is a second site with dialysis needle in place with no bleeding. Surgicel was assented to help with bleeding. Constant pressure being held. Will consult vascular surgery and check blood work.  I consulted with Dr. Kellie Simmering from vascular surgery who would like [place] to set up with suture and lidocaine for him to her place a stitch if needed. He would like me to call him back with INR results and will be done when he can.  INR is 2.70. CBC shows a hemoglobin of 12.5. I consulted with Dr. Kellie Simmering who came down to see the patient.   Bleeding was controlled and the first site that was originally bleeding. On Dr. Evelena Leyden evaluation he pulled the second dialysis needle and this began bleeding again. He was able to place proline suture to control the bleeding. Direct pressure was held for 20 minutes and then bandage was applied. No further bleeding.   Nursing home was contacted and advised to hold the patient's Coumadin until she has hemodialysis on Friday and her INR has been checked prior to restarting coumadin. Will discharge to SNF. I advised the patient to follow-up with their primary care provider  this week. I advised the patient to return to the emergency department with new or worsening symptoms or new concerns. The patient verbalized understanding and agreement with plan.    This patient was discussed with and evaluated by Dr. Alvino Chapel who agrees with assessment and plan.   Waynetta Pean, PA-C 07/27/15 [place], MD 07/28/15 [phone]

## 2015-07-27 NOTE — Discharge Instructions (Signed)
Please hold coumadin until after HD on Friday and after her INR has been checked prior to restarting coumadin. Please evaluate the need for continued plavix use.   Dialysis Vascular Access Malfunction A vascular access is an entrance to your blood vessels that can be used for dialysis. A vascular access can be made in one of several ways:   Joining an artery to a vein under your skin to make a bigger blood vessel called a fistula.   Joining an artery to a vein under your skin using a soft tube called a graft.   Placing a thin, flexible tube (catheter) in a large vein, usually in your neck.  A vascular access may malfunction or become blocked.  WHAT CAN CAUSE YOUR VASCULAR ACCESS TO MALFUNCTION?  Infection (common).   A blood clot inside a part of the fistula, graft, or catheter. A blood clot can completely or partially block the flow of blood.   A kink in the graft or catheter.   A collection of blood (called a hematoma or bruise) next to the graft or catheter that pushes against it, blocking the flow of blood.  WHAT ARE SIGNS AND SYMPTOMS OF VASCULAR ACCESS MALFUNCTION?  There is a change in the vibration or pulse of your fistula or graft.  The vibration or pulse of your fistula or graft is gone.   There is new or unusual swelling of the area around the access.   There was an unsuccessful puncture of your access by the dialysis team.   The flow of blood through the fistula, graft, or catheter is too slow for effective dialysis.   When routine dialysis is completed and the needle is removed, bleeding lasts for too long a time.  WHAT HAPPENS IF MY VASCULAR ACCESS MALFUNCTIONS? Your health care provider may order blood work, cultures, or an X-ray test in order to learn what may be wrong with your vascular access. The X-ray test involves the injection of a liquid into the vascular access. The liquid shows up on the X-ray and allows your health care provider to see if there  is a blockage in the vascular access.  Treatment varies depending on the cause of the malfunction:   If the vascular access is infected, your health care provider may prescribe antibiotic medicine to control the infection.   If a clot is found in the vascular access, you may need surgery to remove the clot.   If a blockage in the vascular access is due to some other cause (such as a kink in a graft), then you will likely need surgery to unblock or replace the graft.  HOME CARE INSTRUCTIONS: Follow up with your surgeon or other health care provider if you were instructed to do so. This is very important. Any delay in follow-up could cause permanent dysfunction of the vascular access, which may be dangerous.  SEEK MEDICAL CARE IF:   Fever develops.   Swelling and pain around the vascular access gets worse or new pain develops.  Pain, numbness, or an unusual pale skin color develops in the hand on the side of your vascular access. SEEK IMMEDIATE MEDICAL CARE IF: Unusual bleeding develops at the location of the vascular access. MAKE SURE YOU:  Understand these instructions.  Will watch your condition.  Will get help right away if you are not doing well or get worse.   This information is not intended to replace advice given to you by your health care provider. Make sure you  discuss any questions you have with your health care provider.   Document Released: 03/12/2006 Document Revised: 04/30/2014 Document Reviewed: 09/11/2012 Elsevier Interactive Patient Education [employer].

## 2015-07-27 NOTE — Consult Note (Signed)
Hospital Consult    Reason for Consult:  Bleeding right thigh graft Referring Physician:  ED MRN #:  [id number]  History of Present Illness: This is a 55 y.o. female with ESRD on dialysis MWF, on coumadin for A-fib who presents to the ED from dialysis clinic with a bleeding dialysis graft.  She completed dialysis today and when the needle was removed, she began to bleed, which started around 1030.  She was transferred to the ED.  One needle was left in.    She is on coumadin for afib.  Her INR ~ a week ago was 1.4 and is 2.7 today.  The pt states she has been receiving more coumadin in the past week, but it is unclear what that dosage is.  She is also on Plavix.    She is on medication for diabetes.  She is on antihypertensive medication also.  Past Medical History  Diagnosis Date  . Hypertension   . CVA (cerebral infarction)     right parietal 05/19/2000  . Vaginal bleeding   . Hyperparathyroidism   . Pneumonia   . Stroke ([place])     2002,no residual  . Peripheral vascular disease ([place])   . GERD (gastroesophageal reflux disease)   . DM (diabetes mellitus) type II controlled peripheral vascular disorder ([place])   . Slip/trip w/o falling due to stepping on object, subs July  2014  . ESRD (end stage renal disease) ([place])     dialysis M/W/F  . Constipation   . Complication of anesthesia     Morphine makes her nauseated, states she has small veins  . Atrial fibrillation ([place])   . Anemia     Past Surgical History  Procedure Laterality Date  . Parathyroidectomy with autotransplant  12/07/2010  . Knee surgery Right X 2  . Brachial artery graft      x2 for dialysis  . Av fistula placement Right     upper thigh  . Dental extractions Bilateral   . Dilation and curettage of uterus  1986  . Transmetatarsal amputation Left 07/15/2012    Procedure: TRANSMETATARSAL AMPUTATION;  Surgeon: Angelia Mould, MD;  Location: [facility] OR;  Service: Vascular;  Laterality: Left;  . Leg surgery Right    . Foot surgery Left     5 TOES AMPUTATED  . Abdominal aortagram N/A 05/27/2012    Procedure: ABDOMINAL Maxcine Ham;  Surgeon: Serafina Mitchell, MD;  Location: [facility] CATH LAB;  Service: Cardiovascular;  Laterality: N/A;  . Lower extremity angiogram Left 05/27/2012    Procedure: LOWER EXTREMITY ANGIOGRAM;  Surgeon: Serafina Mitchell, MD;  Location: [facility] CATH LAB;  Service: Cardiovascular;  Laterality: Left;  lt leg angio  . Tooth extraction  Sep 08, 2014  . Peripheral vascular catheterization N/A 01/11/2015    Procedure: Lower Extremity Angiography;  Surgeon: Serafina Mitchell, MD;  Location: [place] CV LAB;  Service: Cardiovascular;  Laterality: N/A;  . I&d extremity Left 05/05/2015    Procedure: DEBRIDEMENT HEEL WOUND;  Surgeon: Angelia Mould, MD;  Location: [place];  Service: Vascular;  Laterality: Left;  . Application of wound vac Left 05/05/2015    Procedure: APPLICATION OF WOUND VAC;  Surgeon: Angelia Mould, MD;  Location: [place];  Service: Vascular;  Laterality: Left;  . Amputation Left 06/28/2015    Procedure:  Left BELOW KNEE amputation;  Surgeon: Angelia Mould, MD;  Location: [place];  Service: Vascular;  Laterality: Left;    Allergies  Allergen Reactions  .  Doxycycline Itching  . Peroxyl [Hydrogen Peroxide] Hives and Rash    Prior to Admission medications   Medication Sig Start Date End Date Taking? Authorizing Provider  acetaminophen (TYLENOL) 325 MG tablet Take 650 mg by mouth every Monday, Wednesday, and Friday. With dialysis    Historical Provider, MD  B Complex-C-Folic Acid (RENA-VITE RX) 1 MG TABS Take 1 mg by mouth daily. 05/26/15   Historical Provider, MD  CALCIUM-VITAMIN D PO Take 500 mg by mouth 3 (three) times daily with meals.     Historical Provider, MD  ciprofloxacin (CIPRO) 500 MG tablet Take 500 mg by mouth 2 (two) times daily.    Historical Provider, MD  clopidogrel (PLAVIX) 75 MG tablet Take 75 mg by mouth daily. 06/16/15   Historical Provider, MD    collagenase (SANTYL) ointment Apply 1 application topically daily. Apply daily to Left Heel  And cover with dry sterile dressing. 06/02/15   Angelia Mould, MD  Cyanocobalamin (VITAMIN B-12 PO) Take 1 tablet by mouth 4 (four) times a week. Non dialysis days. Cain Saupe, Sat and Sunday    Historical Provider, MD  diazepam (VALIUM) 5 MG tablet Take 5 mg by mouth every 8 (eight) hours as needed for muscle spasms.  11/15/14   Historical Provider, MD  diltiazem (CARDIZEM CD) 120 MG 24 hr capsule Take 1 capsule (120 mg total) by mouth daily. 03/27/15   Shanker Kristeen Mans, MD  diphenhydrAMINE (BENADRYL) 25 MG tablet Take 25 mg by mouth every 6 (six) hours as needed (Muscle spasm in feet).    Historical Provider, MD  glipiZIDE (GLUCOTROL) 10 MG tablet Take 0.5 tablets (5 mg total) by mouth daily. 11/23/14   Lance Bosch, NP  oxyCODONE (OXY IR/ROXICODONE) 5 MG immediate release tablet Take 1-2 tablets (5-10 mg total) by mouth every 4 (four) hours as needed for moderate pain. 07/02/15   Ulyses Amor, PA-C  polyethylene glycol (MIRALAX / GLYCOLAX) packet Take 17 g by mouth daily. Patient taking differently: Take 17 g by mouth daily as needed for mild constipation.  08/19/14   Reyne Dumas, MD  traMADol (ULTRAM) 50 MG tablet Take 1-2 tablets by mouth every 6 hours as needed for foot pain 05/16/15   Sharmon Leyden Nickel, NP  vitamin C (ASCORBIC ACID) 500 MG tablet Take 500 mg by mouth daily.    Historical Provider, MD  vitamin E 400 UNIT capsule Take 400 Units by mouth daily.    Historical Provider, MD  warfarin (COUMADIN) 5 MG tablet Take 1/2 to 1 tablet by mouth daily as directed 06/23/15   Leonie Man, MD    Social History   Social History  . Marital Status: Single    Spouse Name: N/A  . Number of Children: N/A  . Years of Education: N/A   Occupational History  . Not on file.   Social History Main Topics  . Smoking status: Never Smoker   . Smokeless tobacco: Never Used  . Alcohol Use: No  .  Drug Use: No  . Sexual Activity: No   Other Topics Concern  . Not on file   Social History Narrative     Family History  Problem Relation Age of Onset  . Hypertension Father   . Kidney disease Mother     ROS: [x]  Positive   [ ]  Negative   [ ]  All sytems reviewed and are negative  Cardiovascular: []  chest pain/pressure []  palpitations []  SOB lying flat []  DOE []  pain in legs  while walking []  pain in legs at rest []  pain in legs at night []  non-healing ulcers []  hx of DVT []  swelling in legs [x]  hx LLE amputation  [x]  afib  Pulmonary: []  productive cough []  asthma/wheezing []  home O2  Neurologic: []  weakness in []  arms []  legs []  numbness in []  arms []  legs [x]  hx of CVA []  mini stroke [] difficulty speaking or slurred speech []  temporary loss of vision in one eye []  dizziness  Hematologic: []  hx of cancer []  bleeding problems []  problems with blood clotting easily  Endocrine:   [x]  diabetes []  thyroid disease  GI []  vomiting blood []  blood in stool  GU: [x]  CKD/renal failure [x]  HD--[x]  M/W/F or []  T/T/S []  burning with urination []  blood in urine  Psychiatric: []  anxiety []  depression  Musculoskeletal: []  arthritis []  joint pain  Integumentary: []  rashes []  ulcers  Constitutional: []  fever []  chills   Physical Examination  Filed Vitals:   07/27/15 1445 07/27/15 1507  BP: 136/68 149/78  Pulse: 96 96  Temp:    Resp: 20    There is no weight on file to calculate BMI.  General:  WDWN in NAD Gait: Not observed HENT: WNL, normocephalic Pulmonary: normal non-labored breathing, without Rales, rhonchi,  wheezing Abdomen:obese Skin: without rashes Vascular Exam/Pulses: Bleeding from right thigh graft needle stick; +thrill within graft Extremities: without ischemic changes, without Gangrene , without cellulitis; without open wounds;  Musculoskeletal: no muscle wasting or atrophy  Neurologic: A&O X 3; Appropriate Affect ; SENSATION:  normal; MOTOR FUNCTION:  moving all extremities equally. Speech is fluent/normal   CBC    Component Value Date/Time   WBC 6.5 07/27/2015 1330   RBC 4.77 07/27/2015 1330   HGB 12.5 07/27/2015 1330   HCT 41.1 07/27/2015 1330   PLT 246 07/27/2015 1330   MCV 86.2 07/27/2015 1330   MCH 26.2 07/27/2015 1330   MCHC 30.4 07/27/2015 1330   RDW 18.7* 07/27/2015 1330   LYMPHSABS 1.9 07/27/2015 1330   MONOABS 0.5 07/27/2015 1330   EOSABS 0.6 07/27/2015 1330   BASOSABS 0.1 07/27/2015 1330    BMET    Component Value Date/Time   NA 135 07/27/2015 1330   K 4.7 07/27/2015 1330   CL 92* 07/27/2015 1330   CO2 27 07/27/2015 1330   GLUCOSE 119* 07/27/2015 1330   BUN 17 07/27/2015 1330   CREATININE 5.17* 07/27/2015 1330   CALCIUM 9.1 07/27/2015 1330   GFRNONAA 9* 07/27/2015 1330   GFRAA 10* 07/27/2015 1330    COAGS: Lab Results  Component Value Date   INR 2.70* 07/27/2015   INR 1.4* 07/08/2015   INR 2.19* 07/02/2015     Non-Invasive Vascular Imaging:   none  Statin:  No. Beta Blocker:  No. Aspirin:  No. ACEI:  No. ARB:  No. Other antiplatelets/anticoagulants:  Yes.   Plavix/coumadin   ASSESSMENT/PLAN: This is a 55 y.o. female with ESRD who dialyzes M/W/F presents to the ED with a bleeding right thigh graft.  (pt is on coumadin and Plavix).   -1st needle stick was held with pressure and bleeding was stopped -the 2nd needle was removed and pressure held-this continued to bleed and two 5-0 prolene sutures were placed by Dr. Kellie Simmering, which did control the bleeding.   -manual pressure will be held for another 30 minutes  -recommend getting coumadin regulated with a lower INR and no coumadin before returning to HD on Friday.     Leontine Locket, PA-C [facility]  [phone] Agree with above Patient will need Coumadin held for 2 days and then readjusted to keep INR at lower level prevent further bleeding  We'll see again on a when necessary basis

## 2015-07-27 NOTE — ED Notes (Signed)
Holding pressure on vascular access sight

## 2015-07-27 NOTE — ED Notes (Signed)
Bandage applied to first site. No blood noted on dressing. Debbie NT at bedside for 30 minutes to watch for blood saturating the dressing.

## 2015-07-27 NOTE — ED Notes (Signed)
Per [facility], pt at dialysis, finished treatment. Pt has access in two spots on her R lower leg. When they removed the needle on first spot, could not stop the bleeding. Nothing spurting, pressure behind held at this time and bleeding controlled. Pt states she thinks her facility ([facility]) has been doubling her coumadin. She states shes supposed to get half a tab and they give her a whole one. Pt also on plavix and the dialysis place gave her heparin as well. Unable to stop bleeding on R leg. Needle still in second site of R leg. Pt denies complaints at this time.

## 2015-07-27 NOTE — ED Notes (Signed)
Per Will PA to keep second needle in until vascular surgeon comes

## 2015-07-29 DIAGNOSIS — N186 End stage renal disease: Secondary | ICD-10-CM | POA: Diagnosis not present

## 2015-07-29 DIAGNOSIS — D631 Anemia in chronic kidney disease: Secondary | ICD-10-CM | POA: Diagnosis not present

## 2015-07-29 DIAGNOSIS — N2581 Secondary hyperparathyroidism of renal origin: Secondary | ICD-10-CM | POA: Diagnosis not present

## 2015-07-29 DIAGNOSIS — E1129 Type 2 diabetes mellitus with other diabetic kidney complication: Secondary | ICD-10-CM | POA: Diagnosis not present

## 2015-08-01 DIAGNOSIS — E1129 Type 2 diabetes mellitus with other diabetic kidney complication: Secondary | ICD-10-CM | POA: Diagnosis not present

## 2015-08-01 DIAGNOSIS — N2581 Secondary hyperparathyroidism of renal origin: Secondary | ICD-10-CM | POA: Diagnosis not present

## 2015-08-01 DIAGNOSIS — D631 Anemia in chronic kidney disease: Secondary | ICD-10-CM | POA: Diagnosis not present

## 2015-08-01 DIAGNOSIS — N186 End stage renal disease: Secondary | ICD-10-CM | POA: Diagnosis not present

## 2015-08-03 ENCOUNTER — Encounter: Payer: Medicare Other | Admitting: Vascular Surgery

## 2015-08-03 ENCOUNTER — Encounter: Payer: Self-pay | Admitting: Vascular Surgery

## 2015-08-03 DIAGNOSIS — N186 End stage renal disease: Secondary | ICD-10-CM | POA: Diagnosis not present

## 2015-08-03 DIAGNOSIS — D631 Anemia in chronic kidney disease: Secondary | ICD-10-CM | POA: Diagnosis not present

## 2015-08-03 DIAGNOSIS — N2581 Secondary hyperparathyroidism of renal origin: Secondary | ICD-10-CM | POA: Diagnosis not present

## 2015-08-03 DIAGNOSIS — E1129 Type 2 diabetes mellitus with other diabetic kidney complication: Secondary | ICD-10-CM | POA: Diagnosis not present

## 2015-08-05 DIAGNOSIS — E1129 Type 2 diabetes mellitus with other diabetic kidney complication: Secondary | ICD-10-CM | POA: Diagnosis not present

## 2015-08-05 DIAGNOSIS — N186 End stage renal disease: Secondary | ICD-10-CM | POA: Diagnosis not present

## 2015-08-05 DIAGNOSIS — N2581 Secondary hyperparathyroidism of renal origin: Secondary | ICD-10-CM | POA: Diagnosis not present

## 2015-08-05 DIAGNOSIS — D631 Anemia in chronic kidney disease: Secondary | ICD-10-CM | POA: Diagnosis not present

## 2015-08-08 DIAGNOSIS — N2581 Secondary hyperparathyroidism of renal origin: Secondary | ICD-10-CM | POA: Diagnosis not present

## 2015-08-08 DIAGNOSIS — N186 End stage renal disease: Secondary | ICD-10-CM | POA: Diagnosis not present

## 2015-08-08 DIAGNOSIS — E1129 Type 2 diabetes mellitus with other diabetic kidney complication: Secondary | ICD-10-CM | POA: Diagnosis not present

## 2015-08-08 DIAGNOSIS — D631 Anemia in chronic kidney disease: Secondary | ICD-10-CM | POA: Diagnosis not present

## 2015-08-10 ENCOUNTER — Non-Acute Institutional Stay (SKILLED_NURSING_FACILITY): Payer: Medicare Other | Admitting: Adult Health

## 2015-08-10 ENCOUNTER — Ambulatory Visit (INDEPENDENT_AMBULATORY_CARE_PROVIDER_SITE_OTHER): Payer: Medicare Other | Admitting: Vascular Surgery

## 2015-08-10 ENCOUNTER — Encounter: Payer: Self-pay | Admitting: Adult Health

## 2015-08-10 ENCOUNTER — Encounter: Payer: Self-pay | Admitting: Vascular Surgery

## 2015-08-10 VITALS — BP 123/71 | HR 104 | Temp 98.1°F | Resp 14 | Ht 62.0 in | Wt 155.0 lb

## 2015-08-10 DIAGNOSIS — E1129 Type 2 diabetes mellitus with other diabetic kidney complication: Secondary | ICD-10-CM | POA: Diagnosis not present

## 2015-08-10 DIAGNOSIS — I4891 Unspecified atrial fibrillation: Secondary | ICD-10-CM

## 2015-08-10 DIAGNOSIS — D631 Anemia in chronic kidney disease: Secondary | ICD-10-CM | POA: Diagnosis not present

## 2015-08-10 DIAGNOSIS — I739 Peripheral vascular disease, unspecified: Secondary | ICD-10-CM

## 2015-08-10 DIAGNOSIS — N186 End stage renal disease: Secondary | ICD-10-CM | POA: Diagnosis not present

## 2015-08-10 DIAGNOSIS — N2581 Secondary hyperparathyroidism of renal origin: Secondary | ICD-10-CM | POA: Diagnosis not present

## 2015-08-10 NOTE — Progress Notes (Signed)
  POST OPERATIVE OFFICE NOTE    CC:  F/u for surgery  HPI:  This is a 55 y.o. female who is s/p left BKA.  She is now 5 weeks post-op and here for incisional check and staple removal.  She currently resides at a SNF for rehabilitation.    Allergies  Allergen Reactions  . Doxycycline Itching  . Peroxyl [Hydrogen Peroxide] Hives and Rash    Current Outpatient Prescriptions  Medication Sig Dispense Refill  . acetaminophen (TYLENOL) 325 MG tablet Take 650 mg by mouth every Monday, Wednesday, and Friday. With dialysis    . B Complex-C-Folic Acid (RENA-VITE RX) 1 MG TABS Take 1 mg by mouth daily.  1  . CALCIUM-VITAMIN D PO Take 500 mg by mouth 3 (three) times daily with meals.     . Cyanocobalamin (VITAMIN B-12 PO) Take 1 tablet by mouth 4 (four) times a week. Non dialysis days. Tues, Thurs, Sat and Sunday    . diazepam (VALIUM) 5 MG tablet Take 5 mg by mouth every 8 (eight) hours as needed for muscle spasms.   0  . diltiazem (CARDIZEM CD) 120 MG 24 hr capsule Take 1 capsule (120 mg total) by mouth daily. 60 capsule 0  . diphenhydrAMINE (BENADRYL) 25 MG tablet Take 25 mg by mouth every 6 (six) hours as needed (Muscle spasm in feet).    Marland Kitchen glipiZIDE (GLUCOTROL) 10 MG tablet Take 0.5 tablets (5 mg total) by mouth daily. 45 tablet 3  . oxyCODONE (OXY IR/ROXICODONE) 5 MG immediate release tablet Take 1-2 tablets (5-10 mg total) by mouth every 4 (four) hours as needed for moderate pain. 30 tablet 0  . polyethylene glycol (MIRALAX / GLYCOLAX) packet Take 17 g by mouth daily. (Patient taking differently: Take 17 g by mouth daily as needed for mild constipation. ) 14 each 0  . vitamin C (ASCORBIC ACID) 500 MG tablet Take 500 mg by mouth daily.    Marland Kitchen warfarin (COUMADIN) 5 MG tablet Take 1/2 to 1 tablet by mouth daily as directed (Patient taking differently: Take 2.5-5 mg by mouth daily. Take 2.5 mg daily EXCEPT Sunday) 30 tablet 3  . traMADol (ULTRAM) 50 MG tablet Take 1-2 mg by mouth every 6 (six) hours  as needed. Reported on 08/10/2015    . vitamin E 400 UNIT capsule Take 400 Units by mouth daily.     No current facility-administered medications for this visit.     ROS:  See HPI  Physical Exam:  Filed Vitals:   08/10/15 1550  BP: 123/71  Pulse: 104  Temp: 98.1 F (36.7 C)  Resp: 14    Incision:  Well healed, removal of staple tolerated well, dry dressing and ace applied for comfort Extremities:  Left BKA stump warm to touch, right LE warm and well perfused without ulcers.   Assessment/Plan:  This is a 55 y.o. female who is s/p: Left BKA  She was given a prescription for biotech to prepare for prothesis fitting.  Continue rehabilitation and f/U in 6 months for repeat right LE ABI.     Theda Sers Yeshua [facility] PA-C [facility] [phone]  Clinic MD:  Pt seen and examined with Dr. [name]

## 2015-08-10 NOTE — Progress Notes (Signed)
Patient ID: [name], female   DOB: [birth year], 55 y.o.   MRN: [id number]   Facility:  [facility]       Allergies  Allergen Reactions  . Doxycycline Itching  . Peroxyl [Hydrogen Peroxide] Hives and Rash    Chief Complaint  Patient presents with  . Acute Visit    medication management      HPI:  She is not taking her anticoagulation medications on a consistent basis. She is not taking the plavix at all and is not taking her lovenox either. Her inr is not therapeutic. I have spoken with her at great length regarding the importance of taking her medication on a daily basis. She tells me that she will take her coumadin only on non dialysis days. She is convinced that  She will bleed out if she takes the coumadin on a routine basis. She also told me that she took 1/2 pill on dialysis days and a whole tab on non-dialysis days. I have spoken with the dialysis nurse and have left a message at the coumadin clinic.    Past Medical History  Diagnosis Date  . Hypertension   . CVA (cerebral infarction)     right parietal 05/19/2000  . Vaginal bleeding   . Hyperparathyroidism   . Pneumonia   . Stroke ([place])     2002,no residual  . Peripheral vascular disease ([place])   . GERD (gastroesophageal reflux disease)   . DM (diabetes mellitus) type II controlled peripheral vascular disorder ([place])   . Slip/trip w/o falling due to stepping on object, subs July  2014  . ESRD (end stage renal disease) ([place])     dialysis M/W/F  . Constipation   . Complication of anesthesia     Morphine makes her nauseated, states she has small veins  . Atrial fibrillation ([place])   . Anemia     Past Surgical History  Procedure Laterality Date  . Parathyroidectomy with autotransplant  12/07/2010  . Knee surgery Right X 2  . Brachial artery graft      x2 for dialysis  . Av fistula placement Right     upper thigh  . Dental extractions Bilateral   . Dilation and curettage of uterus  1986  . Transmetatarsal  amputation Left 07/15/2012    Procedure: TRANSMETATARSAL AMPUTATION;  Surgeon: Angelia Mould, MD;  Location[facility] OR;  Service: Vascular;  Laterality: Left;  . Leg surgery Right   . Foot surgery Left     5 TOES AMPUTATED  . Abdominal aortagram N/A 05/27/2012    Procedure: ABDOMINAL Maxcine Ham;  Surgeon: Serafina Mitchell, MD;  Location: [facility] CATH LAB;  Service: Cardiovascular;  Laterality: N/A;  . Lower extremity angiogram Left 05/27/2012    Procedure: LOWER EXTREMITY ANGIOGRAM;  Surgeon: Serafina Mitchell, MD;  Location: [facility] CATH LAB;  Service: Cardiovascular;  Laterality: Left;  lt leg angio  . Tooth extraction  Sep 08, 2014  . Peripheral vascular catheterization N/A 01/11/2015    Procedure: Lower Extremity Angiography;  Surgeon: Serafina Mitchell, MD;  Location: [place] CV LAB;  Service: Cardiovascular;  Laterality: N/A;  . I&d extremity Left 05/05/2015    Procedure: DEBRIDEMENT HEEL WOUND;  Surgeon: Angelia Mould, MD;  Location: [place];  Service: Vascular;  Laterality: Left;  . Application of wound vac Left 05/05/2015    Procedure: APPLICATION OF WOUND VAC;  Surgeon: Angelia Mould, MD;  Location: [place];  Service: Vascular;  Laterality: Left;  . Amputation Left 06/28/2015  Procedure:  Left BELOW KNEE amputation;  Surgeon: Angelia Mould, MD;  Location: [facility] OR;  Service: Vascular;  Laterality: Left;    VITAL SIGNS BP 123/66 mmHg  Pulse 102  Temp(Src) 98 F (36.7 C) (Oral)  Resp 19  Ht 5\' 2"  (1.575 m)  Wt 155 lb 8 oz (70.534 kg)  BMI 28.43 kg/m2  LMP  (LMP Unknown)  Patient's Medications  New Prescriptions   No medications on file  Previous Medications   ACETAMINOPHEN (TYLENOL) 325 MG TABLET    Take 650 mg by mouth every Monday, Wednesday, and Friday. With dialysis   B COMPLEX-C-FOLIC ACID (RENA-VITE RX) 1 MG TABS    Take 1 mg by mouth daily.   CALCIUM-VITAMIN D PO    Take 500 mg by mouth 3 (three) times daily with meals.    CYANOCOBALAMIN (VITAMIN B-12 PO)    Take  1 tablet by mouth 4 (four) times a week. Non dialysis days. Tues, Thurs, Sat and Sunday   DIAZEPAM (VALIUM) 5 MG TABLET    Take 5 mg by mouth every 8 (eight) hours as needed for muscle spasms.    DILTIAZEM (CARDIZEM CD) 120 MG 24 HR CAPSULE    Take 1 capsule (120 mg total) by mouth daily.   DIPHENHYDRAMINE (BENADRYL) 25 MG TABLET    Take 25 mg by mouth every 6 (six) hours as needed (Muscle spasm in feet).   GLIPIZIDE (GLUCOTROL) 10 MG TABLET    Take 0.5 tablets (5 mg total) by mouth daily.   OXYCODONE (OXY IR/ROXICODONE) 5 MG IMMEDIATE RELEASE TABLET    Take 1-2 tablets (5-10 mg total) by mouth every 4 (four) hours as needed for moderate pain.   POLYETHYLENE GLYCOL (MIRALAX / GLYCOLAX) PACKET    Take 17 g by mouth daily.   TRAMADOL (ULTRAM) 50 MG TABLET    Take 1-2 mg by mouth every 6 (six) hours as needed.   VITAMIN C (ASCORBIC ACID) 500 MG TABLET    Take 500 mg by mouth daily.   VITAMIN E 400 UNIT CAPSULE    Take 400 Units by mouth daily.   WARFARIN (COUMADIN) 5 MG TABLET    Take 1/2 to 1 tablet by mouth daily as directed  Modified Medications   No medications on file  Discontinued Medications     SIGNIFICANT DIAGNOSTIC EXAMS  LABS REVIEWED:   06-28-15: wbc 8.5; hgb 8.8;hct 28.7;mcv 82.9; plt 419; glucose 156; bun 22; creat 6.68; k+ 3.6; na++138; liver normal albumin 2.8 06-30-15: wbc 14.4; hgb 6.9; hct 21.9; mcv 82.0; plt 369; glucose 148; bun 24; creat 7.64; k+ 3.9; na++ 134;  07-02-15: wbc 13.3; hgb 9.4; hct 29.6; mcv 81.8; plt 374    Review of Systems  Constitutional: Negative for malaise/fatigue.  Respiratory: Negative for cough and shortness of breath.   Cardiovascular: Negative for chest pain, palpitations and leg swelling.  Gastrointestinal: Negative for heartburn, abdominal pain and constipation.  Musculoskeletal: Positive for myalgias. Negative for back pain and joint pain.       Has stump pain is managed    Skin: Negative.   Neurological: Negative for dizziness.    Psychiatric/Behavioral: The patient is not nervous/anxious.     Physical Exam  Constitutional: She is oriented to person, place, and time. No distress.  Eyes: Conjunctivae are normal.  Neck: Neck supple. No JVD present. No thyromegaly present.  Cardiovascular: Normal rate, regular rhythm and intact distal pulses.   Respiratory: Effort normal and breath sounds normal. No respiratory distress.  She has no wheezes.  GI: Soft. Bowel sounds are normal. She exhibits no distension. There is no tenderness.  Musculoskeletal: She exhibits no edema.  Able to move all extremities  Is status post left bka Stump without signs of infection present   Lymphadenopathy:    She has no cervical adenopathy.  Neurological: She is alert and oriented to person, place, and time.  Skin: Skin is warm and dry. She is not diaphoretic.  Right thigh A/V graph + thrill + bruit   Psychiatric: She has a normal mood and affect.      ASSESSMENT/ PLAN:  4. Afib: heart rate is stable; will continue cardizem cd 120 mg daily; is on long term coumadin therapy; inr is pending will monitor Will await to hear from coumadin clinic for further advice regarding her coumadin therapy compliance      Time spent with patient  50  minutes >50% time spent counseling; reviewing medical record; tests; labs; and developing future plan of care            Ok Edwards NP [facility]  Contact [phone] Monday through Friday 8am- 5pm  After hours call [phone]

## 2015-08-11 ENCOUNTER — Telehealth: Payer: Self-pay | Admitting: Pharmacist Clinician (PhC)/ Clinical Pharmacy Specialist

## 2015-08-11 NOTE — Telephone Encounter (Signed)
Spoke with Jackelyn Poling about [name].   She is apparently refusing most of her warfarin doses.  Concerned about bleed risk.  Reviewed options with Debbie including plavix plus aspirin or going for slightly lower INR goal to convince her to take med.  Patient mentioned taking less warfarin (or none) on dialysis days.  Was explained to her that that is not always possible, depending on INR.   Jackelyn Poling will continue to work with patient, as will dialysis, to get her anticoagulated again. Unfortunately because of dialysis, cannot use DOAC.

## 2015-08-12 DIAGNOSIS — N2581 Secondary hyperparathyroidism of renal origin: Secondary | ICD-10-CM | POA: Diagnosis not present

## 2015-08-12 DIAGNOSIS — N186 End stage renal disease: Secondary | ICD-10-CM | POA: Diagnosis not present

## 2015-08-12 DIAGNOSIS — E1129 Type 2 diabetes mellitus with other diabetic kidney complication: Secondary | ICD-10-CM | POA: Diagnosis not present

## 2015-08-12 DIAGNOSIS — D631 Anemia in chronic kidney disease: Secondary | ICD-10-CM | POA: Diagnosis not present

## 2015-08-15 DIAGNOSIS — E1129 Type 2 diabetes mellitus with other diabetic kidney complication: Secondary | ICD-10-CM | POA: Diagnosis not present

## 2015-08-15 DIAGNOSIS — N186 End stage renal disease: Secondary | ICD-10-CM | POA: Diagnosis not present

## 2015-08-15 DIAGNOSIS — D631 Anemia in chronic kidney disease: Secondary | ICD-10-CM | POA: Diagnosis not present

## 2015-08-15 DIAGNOSIS — N2581 Secondary hyperparathyroidism of renal origin: Secondary | ICD-10-CM | POA: Diagnosis not present

## 2015-08-16 DIAGNOSIS — Z992 Dependence on renal dialysis: Secondary | ICD-10-CM | POA: Diagnosis not present

## 2015-08-16 DIAGNOSIS — I871 Compression of vein: Secondary | ICD-10-CM | POA: Diagnosis not present

## 2015-08-16 DIAGNOSIS — N186 End stage renal disease: Secondary | ICD-10-CM | POA: Diagnosis not present

## 2015-08-16 DIAGNOSIS — T82858D Stenosis of vascular prosthetic devices, implants and grafts, subsequent encounter: Secondary | ICD-10-CM | POA: Diagnosis not present

## 2015-08-17 DIAGNOSIS — E1129 Type 2 diabetes mellitus with other diabetic kidney complication: Secondary | ICD-10-CM | POA: Diagnosis not present

## 2015-08-17 DIAGNOSIS — D631 Anemia in chronic kidney disease: Secondary | ICD-10-CM | POA: Diagnosis not present

## 2015-08-17 DIAGNOSIS — N186 End stage renal disease: Secondary | ICD-10-CM | POA: Diagnosis not present

## 2015-08-17 DIAGNOSIS — N2581 Secondary hyperparathyroidism of renal origin: Secondary | ICD-10-CM | POA: Diagnosis not present

## 2015-08-19 DIAGNOSIS — E1129 Type 2 diabetes mellitus with other diabetic kidney complication: Secondary | ICD-10-CM | POA: Diagnosis not present

## 2015-08-19 DIAGNOSIS — N186 End stage renal disease: Secondary | ICD-10-CM | POA: Diagnosis not present

## 2015-08-19 DIAGNOSIS — N2581 Secondary hyperparathyroidism of renal origin: Secondary | ICD-10-CM | POA: Diagnosis not present

## 2015-08-19 DIAGNOSIS — D631 Anemia in chronic kidney disease: Secondary | ICD-10-CM | POA: Diagnosis not present

## 2015-08-21 DIAGNOSIS — N186 End stage renal disease: Secondary | ICD-10-CM | POA: Diagnosis not present

## 2015-08-21 DIAGNOSIS — Z992 Dependence on renal dialysis: Secondary | ICD-10-CM | POA: Diagnosis not present

## 2015-08-21 DIAGNOSIS — E1129 Type 2 diabetes mellitus with other diabetic kidney complication: Secondary | ICD-10-CM | POA: Diagnosis not present

## 2015-08-22 DIAGNOSIS — N2581 Secondary hyperparathyroidism of renal origin: Secondary | ICD-10-CM | POA: Diagnosis not present

## 2015-08-22 DIAGNOSIS — N186 End stage renal disease: Secondary | ICD-10-CM | POA: Diagnosis not present

## 2015-08-22 DIAGNOSIS — E1129 Type 2 diabetes mellitus with other diabetic kidney complication: Secondary | ICD-10-CM | POA: Diagnosis not present

## 2015-08-22 DIAGNOSIS — D631 Anemia in chronic kidney disease: Secondary | ICD-10-CM | POA: Diagnosis not present

## 2015-08-24 DIAGNOSIS — N186 End stage renal disease: Secondary | ICD-10-CM | POA: Diagnosis not present

## 2015-08-24 DIAGNOSIS — D631 Anemia in chronic kidney disease: Secondary | ICD-10-CM | POA: Diagnosis not present

## 2015-08-24 DIAGNOSIS — N2581 Secondary hyperparathyroidism of renal origin: Secondary | ICD-10-CM | POA: Diagnosis not present

## 2015-08-24 DIAGNOSIS — E1129 Type 2 diabetes mellitus with other diabetic kidney complication: Secondary | ICD-10-CM | POA: Diagnosis not present

## 2015-08-26 ENCOUNTER — Ambulatory Visit: Payer: Self-pay | Admitting: Pharmacist Clinician (PhC)/ Clinical Pharmacy Specialist

## 2015-08-26 DIAGNOSIS — N2581 Secondary hyperparathyroidism of renal origin: Secondary | ICD-10-CM | POA: Diagnosis not present

## 2015-08-26 DIAGNOSIS — E1129 Type 2 diabetes mellitus with other diabetic kidney complication: Secondary | ICD-10-CM | POA: Diagnosis not present

## 2015-08-26 DIAGNOSIS — N186 End stage renal disease: Secondary | ICD-10-CM | POA: Diagnosis not present

## 2015-08-26 DIAGNOSIS — D631 Anemia in chronic kidney disease: Secondary | ICD-10-CM | POA: Diagnosis not present

## 2015-08-29 DIAGNOSIS — E1129 Type 2 diabetes mellitus with other diabetic kidney complication: Secondary | ICD-10-CM | POA: Diagnosis not present

## 2015-08-29 DIAGNOSIS — D631 Anemia in chronic kidney disease: Secondary | ICD-10-CM | POA: Diagnosis not present

## 2015-08-29 DIAGNOSIS — N2581 Secondary hyperparathyroidism of renal origin: Secondary | ICD-10-CM | POA: Diagnosis not present

## 2015-08-29 DIAGNOSIS — N186 End stage renal disease: Secondary | ICD-10-CM | POA: Diagnosis not present

## 2015-08-31 DIAGNOSIS — D631 Anemia in chronic kidney disease: Secondary | ICD-10-CM | POA: Diagnosis not present

## 2015-08-31 DIAGNOSIS — E1129 Type 2 diabetes mellitus with other diabetic kidney complication: Secondary | ICD-10-CM | POA: Diagnosis not present

## 2015-08-31 DIAGNOSIS — N186 End stage renal disease: Secondary | ICD-10-CM | POA: Diagnosis not present

## 2015-08-31 DIAGNOSIS — N2581 Secondary hyperparathyroidism of renal origin: Secondary | ICD-10-CM | POA: Diagnosis not present

## 2015-09-02 DIAGNOSIS — E1129 Type 2 diabetes mellitus with other diabetic kidney complication: Secondary | ICD-10-CM | POA: Diagnosis not present

## 2015-09-02 DIAGNOSIS — D631 Anemia in chronic kidney disease: Secondary | ICD-10-CM | POA: Diagnosis not present

## 2015-09-02 DIAGNOSIS — N2581 Secondary hyperparathyroidism of renal origin: Secondary | ICD-10-CM | POA: Diagnosis not present

## 2015-09-02 DIAGNOSIS — N186 End stage renal disease: Secondary | ICD-10-CM | POA: Diagnosis not present

## 2015-09-05 DIAGNOSIS — N186 End stage renal disease: Secondary | ICD-10-CM | POA: Diagnosis not present

## 2015-09-05 DIAGNOSIS — D631 Anemia in chronic kidney disease: Secondary | ICD-10-CM | POA: Diagnosis not present

## 2015-09-05 DIAGNOSIS — N2581 Secondary hyperparathyroidism of renal origin: Secondary | ICD-10-CM | POA: Diagnosis not present

## 2015-09-05 DIAGNOSIS — E1129 Type 2 diabetes mellitus with other diabetic kidney complication: Secondary | ICD-10-CM | POA: Diagnosis not present

## 2015-09-06 ENCOUNTER — Non-Acute Institutional Stay (SKILLED_NURSING_FACILITY): Payer: Medicare Other | Admitting: Adult Health

## 2015-09-06 ENCOUNTER — Encounter: Payer: Self-pay | Admitting: Adult Health

## 2015-09-06 DIAGNOSIS — E1142 Type 2 diabetes mellitus with diabetic polyneuropathy: Secondary | ICD-10-CM

## 2015-09-06 DIAGNOSIS — I4891 Unspecified atrial fibrillation: Secondary | ICD-10-CM | POA: Diagnosis not present

## 2015-09-06 DIAGNOSIS — Z7901 Long term (current) use of anticoagulants: Secondary | ICD-10-CM

## 2015-09-06 DIAGNOSIS — Z89512 Acquired absence of left leg below knee: Secondary | ICD-10-CM

## 2015-09-06 DIAGNOSIS — N186 End stage renal disease: Secondary | ICD-10-CM

## 2015-09-06 DIAGNOSIS — Z992 Dependence on renal dialysis: Secondary | ICD-10-CM

## 2015-09-06 NOTE — Progress Notes (Signed)
Patient ID: [name], female   DOB: 1961-03-30, 55 y.o.   MRN: [id number]   Location:  [facility] Room Number: 117-A Place of Service:  SNF (31)    CODE STATUS: Full Code  Allergies  Allergen Reactions  . Doxycycline Itching  . Peroxyl [Hydrogen Peroxide] Hives and Rash    Chief Complaint  Patient presents with  . Discharge Note    Discharge from facility    HPI:  She is being discharged to home with home health for pt/ot/rn/ss. She will need a standard wheelchair. She will need her prescriptions to be written and will need to follow up with her medical provider.  She had been hospitalized for a left bka and was admitted to this facility for short term rehab    Past Medical History  Diagnosis Date  . Hypertension   . CVA (cerebral infarction)     right parietal 05/19/2000  . Vaginal bleeding   . Hyperparathyroidism   . Pneumonia   . Stroke ([place])     2002,no residual  . Peripheral vascular disease ([place])   . GERD (gastroesophageal reflux disease)   . DM (diabetes mellitus) type II controlled peripheral vascular disorder ([place])   . Slip/trip w/o falling due to stepping on object, subs July  2014  . ESRD (end stage renal disease) ([place])     dialysis M/W/F  . Constipation   . Complication of anesthesia     Morphine makes her nauseated, states she has small veins  . Atrial fibrillation ([place])   . Anemia     Past Surgical History  Procedure Laterality Date  . Parathyroidectomy with autotransplant  12/07/2010  . Knee surgery Right X 2  . Brachial artery graft      x2 for dialysis  . Av fistula placement Right     upper thigh  . Dental extractions Bilateral   . Dilation and curettage of uterus  1986  . Transmetatarsal amputation Left 07/15/2012    Procedure: TRANSMETATARSAL AMPUTATION;  Surgeon: Angelia Mould, MD;  Location: [facility] OR;  Service: Vascular;  Laterality: Left;  . Leg surgery Right   . Foot surgery Left     5  TOES AMPUTATED  . Abdominal aortagram N/A 05/27/2012    Procedure: ABDOMINAL Maxcine Ham;  Surgeon: Serafina Mitchell, MD;  Location: [facility] CATH LAB;  Service: Cardiovascular;  Laterality: N/A;  . Lower extremity angiogram Left 05/27/2012    Procedure: LOWER EXTREMITY ANGIOGRAM;  Surgeon: Serafina Mitchell, MD;  Location: [facility] CATH LAB;  Service: Cardiovascular;  Laterality: Left;  lt leg angio  . Tooth extraction  Sep 08, 2014  . Peripheral vascular catheterization N/A 01/11/2015    Procedure: Lower Extremity Angiography;  Surgeon: Serafina Mitchell, MD;  Location: [place] CV LAB;  Service: Cardiovascular;  Laterality: N/A;  . I&d extremity Left 05/05/2015    Procedure: DEBRIDEMENT HEEL WOUND;  Surgeon: Angelia Mould, MD;  Location: [facility];  Service: Vascular;  Laterality: Left;  . Application of wound vac Left 05/05/2015    Procedure: APPLICATION OF WOUND VAC;  Surgeon: Angelia Mould, MD;  Location: [place];  Service: Vascular;  Laterality: Left;  . Amputation Left 06/28/2015    Procedure:  Left BELOW KNEE amputation;  Surgeon: Angelia Mould, MD;  Location: [facility] OR;  Service: Vascular;  Laterality: Left;    Social History   Social History  . Marital Status: Single    Spouse Name: N/A  . Number  of Children: N/A  . Years of Education: N/A   Occupational History  . Not on file.   Social History Main Topics  . Smoking status: Never Smoker   . Smokeless tobacco: Never Used  . Alcohol Use: No  . Drug Use: No  . Sexual Activity: No   Other Topics Concern  . Not on file   Social History Narrative   Family History  Problem Relation Age of Onset  . Hypertension Father   . Kidney disease Mother     VITAL SIGNS BP 165/75 mmHg  Pulse 42  Temp(Src) 98.7 F (37.1 C) (Oral)  Resp 18  Ht 5\' 2"  (1.575 m)  Wt 158 lb (71.668 kg)  BMI 28.89 kg/m2  SpO2 97%  LMP  (LMP Unknown)  Patient's Medications  New Prescriptions   No medications on file  Previous Medications    ACETAMINOPHEN (TYLENOL) 325 MG TABLET    Take 650 mg by mouth every Monday, Wednesday, and Friday. With dialysis   B COMPLEX-C-FOLIC ACID (RENA-VITE RX) 1 MG TABS    Take 1 mg by mouth daily.   CALCIUM-VITAMIN D PO    Take 500 mg by mouth 3 (three) times daily with meals.    CYANOCOBALAMIN (VITAMIN B-12 PO)    Take 1 tablet by mouth 4 (four) times a week. Non dialysis days. Tues, Thurs, Sat and Sunday   DIAZEPAM (VALIUM) 5 MG TABLET    Take 5 mg by mouth every 8 (eight) hours as needed for muscle spasms.    DILTIAZEM (CARDIZEM CD) 120 MG 24 HR CAPSULE    Take 1 capsule (120 mg total) by mouth daily.   DIPHENHYDRAMINE (BENADRYL) 25 MG TABLET    Take 25 mg by mouth every 6 (six) hours as needed (Muscle spasm in feet).   GLIPIZIDE (GLUCOTROL) 10 MG TABLET    Take 0.5 tablets (5 mg total) by mouth daily.   OXYCODONE (OXY IR/ROXICODONE) 5 MG IMMEDIATE RELEASE TABLET    Take 1-2 tablets (5-10 mg total) by mouth every 4 (four) hours as needed for moderate pain.   POLYETHYLENE GLYCOL (MIRALAX / GLYCOLAX) PACKET    Take 17 g by mouth daily.   TRAMADOL (ULTRAM) 50 MG TABLET    Take 1-2 mg by mouth every 6 (six) hours as needed. Reported on 08/10/2015   VITAMIN C (ASCORBIC ACID) 500 MG TABLET    Take 500 mg by mouth daily.   VITAMIN E 400 UNIT CAPSULE    Take 400 Units by mouth daily.   WARFARIN (COUMADIN) 5 MG TABLET    Take 1/2 to 1 tablet by mouth daily as directed  Modified Medications   No medications on file  Discontinued Medications   No medications on file     SIGNIFICANT DIAGNOSTIC EXAMS  LABS REVIEWED:   06-28-15: wbc 8.5; hgb 8.8;hct 28.7;mcv 82.9; plt 419; glucose 156; bun 22; creat 6.68; k+ 3.6; na++138; liver normal albumin 2.8 06-30-15: wbc 14.4; hgb 6.9; hct 21.9; mcv 82.0; plt 369; glucose 148; bun 24; creat 7.64; k+ 3.9; na++ 134;  07-02-15: wbc 13.3; hgb 9.4; hct 29.6; mcv 81.8; plt 374    Review of Systems  Constitutional: Negative for malaise/fatigue.  Respiratory: Negative for  cough and shortness of breath.   Cardiovascular: Negative for chest pain, palpitations and leg swelling.  Gastrointestinal: Negative for heartburn, abdominal pain and constipation.  Musculoskeletal: Positive for myalgias. Negative for back pain and joint pain.       Has stump pain  is managed    Skin: Negative.   Neurological: Negative for dizziness.  Psychiatric/Behavioral: The patient is not nervous/anxious.     Physical Exam  Constitutional: She is oriented to person, place, and time. No distress.  Eyes: Conjunctivae are normal.  Neck: Neck supple. No JVD present. No thyromegaly present.  Cardiovascular: Normal rate, regular rhythm and intact distal pulses.   Respiratory: Effort normal and breath sounds normal. No respiratory distress. She has no wheezes.  GI: Soft. Bowel sounds are normal. She exhibits no distension. There is no tenderness.  Musculoskeletal: She exhibits no edema.  Able to move all extremities  Is status post left bka Stump without signs of infection present   Lymphadenopathy:    She has no cervical adenopathy.  Neurological: She is alert and oriented to person, place, and time.  Skin: Skin is warm and dry. She is not diaphoretic.  Right thigh A/V graph + thrill + bruit   Psychiatric: She has a normal mood and affect.      ASSESSMENT/ PLAN:  Patient is being discharged with the following home health services:  Pt/ot/rn/ss: to evaluate and treat as indicated for gait; balance; strength; adl training; community services; medication and INR management through coumadin clinic. inr due 09-12-15  Patient is being discharged with the following durable medical equipment:  Standard wheelchair with cushion, elevated leg rests; anti -tippers, and amputee pad in order to allow her to maintain her current level of independence with her adl's which cannot be achieved with a walker; she can self propel.   Patient has been advised to f/u with their PCP in 1-2 weeks to bring  them up to date on their rehab stay.  Social services at facility is responsible for arranging this appointment.  Pt was provided with a 30 day supply of prescriptions for medications and refills must be obtained from their PCP.  For controlled substances, a more limited supply may be provided adequate until PCP appointment only.#15 valium 5 mg tabs; #20 oxycodone 5 mg tabs     Time spent with patient  45  minutes >50% time spent counseling; reviewing medical record; tests; labs; and developing future plan of care    Ok Edwards NP [facility]  Contact [phone] Monday through Friday 8am- 5pm  After hours call [phone]

## 2015-09-07 DIAGNOSIS — D631 Anemia in chronic kidney disease: Secondary | ICD-10-CM | POA: Diagnosis not present

## 2015-09-07 DIAGNOSIS — E1129 Type 2 diabetes mellitus with other diabetic kidney complication: Secondary | ICD-10-CM | POA: Diagnosis not present

## 2015-09-07 DIAGNOSIS — N2581 Secondary hyperparathyroidism of renal origin: Secondary | ICD-10-CM | POA: Diagnosis not present

## 2015-09-07 DIAGNOSIS — N186 End stage renal disease: Secondary | ICD-10-CM | POA: Diagnosis not present

## 2015-09-08 DIAGNOSIS — N186 End stage renal disease: Secondary | ICD-10-CM | POA: Diagnosis not present

## 2015-09-08 DIAGNOSIS — Z7901 Long term (current) use of anticoagulants: Secondary | ICD-10-CM | POA: Diagnosis not present

## 2015-09-08 DIAGNOSIS — E1151 Type 2 diabetes mellitus with diabetic peripheral angiopathy without gangrene: Secondary | ICD-10-CM | POA: Diagnosis not present

## 2015-09-08 DIAGNOSIS — I4891 Unspecified atrial fibrillation: Secondary | ICD-10-CM | POA: Diagnosis not present

## 2015-09-08 DIAGNOSIS — Z4781 Encounter for orthopedic aftercare following surgical amputation: Secondary | ICD-10-CM | POA: Diagnosis not present

## 2015-09-08 DIAGNOSIS — I120 Hypertensive chronic kidney disease with stage 5 chronic kidney disease or end stage renal disease: Secondary | ICD-10-CM | POA: Diagnosis not present

## 2015-09-10 DIAGNOSIS — E1129 Type 2 diabetes mellitus with other diabetic kidney complication: Secondary | ICD-10-CM | POA: Diagnosis not present

## 2015-09-10 DIAGNOSIS — D631 Anemia in chronic kidney disease: Secondary | ICD-10-CM | POA: Diagnosis not present

## 2015-09-10 DIAGNOSIS — N2581 Secondary hyperparathyroidism of renal origin: Secondary | ICD-10-CM | POA: Diagnosis not present

## 2015-09-10 DIAGNOSIS — N186 End stage renal disease: Secondary | ICD-10-CM | POA: Diagnosis not present

## 2015-09-12 DIAGNOSIS — D631 Anemia in chronic kidney disease: Secondary | ICD-10-CM | POA: Diagnosis not present

## 2015-09-12 DIAGNOSIS — E1129 Type 2 diabetes mellitus with other diabetic kidney complication: Secondary | ICD-10-CM | POA: Diagnosis not present

## 2015-09-12 DIAGNOSIS — N2581 Secondary hyperparathyroidism of renal origin: Secondary | ICD-10-CM | POA: Diagnosis not present

## 2015-09-12 DIAGNOSIS — N186 End stage renal disease: Secondary | ICD-10-CM | POA: Diagnosis not present

## 2015-09-13 DIAGNOSIS — E1151 Type 2 diabetes mellitus with diabetic peripheral angiopathy without gangrene: Secondary | ICD-10-CM | POA: Diagnosis not present

## 2015-09-13 DIAGNOSIS — N186 End stage renal disease: Secondary | ICD-10-CM | POA: Diagnosis not present

## 2015-09-13 DIAGNOSIS — I120 Hypertensive chronic kidney disease with stage 5 chronic kidney disease or end stage renal disease: Secondary | ICD-10-CM | POA: Diagnosis not present

## 2015-09-13 DIAGNOSIS — I4891 Unspecified atrial fibrillation: Secondary | ICD-10-CM | POA: Diagnosis not present

## 2015-09-13 DIAGNOSIS — Z7901 Long term (current) use of anticoagulants: Secondary | ICD-10-CM | POA: Diagnosis not present

## 2015-09-13 DIAGNOSIS — Z4781 Encounter for orthopedic aftercare following surgical amputation: Secondary | ICD-10-CM | POA: Diagnosis not present

## 2015-09-14 DIAGNOSIS — E1129 Type 2 diabetes mellitus with other diabetic kidney complication: Secondary | ICD-10-CM | POA: Diagnosis not present

## 2015-09-14 DIAGNOSIS — I482 Chronic atrial fibrillation: Secondary | ICD-10-CM | POA: Diagnosis not present

## 2015-09-14 DIAGNOSIS — N186 End stage renal disease: Secondary | ICD-10-CM | POA: Diagnosis not present

## 2015-09-14 DIAGNOSIS — N2581 Secondary hyperparathyroidism of renal origin: Secondary | ICD-10-CM | POA: Diagnosis not present

## 2015-09-14 DIAGNOSIS — D631 Anemia in chronic kidney disease: Secondary | ICD-10-CM | POA: Diagnosis not present

## 2015-09-15 ENCOUNTER — Ambulatory Visit (INDEPENDENT_AMBULATORY_CARE_PROVIDER_SITE_OTHER): Payer: Medicare Other | Admitting: Pharmacist

## 2015-09-15 DIAGNOSIS — I4891 Unspecified atrial fibrillation: Secondary | ICD-10-CM | POA: Diagnosis not present

## 2015-09-15 DIAGNOSIS — N186 End stage renal disease: Secondary | ICD-10-CM | POA: Diagnosis not present

## 2015-09-15 DIAGNOSIS — E1151 Type 2 diabetes mellitus with diabetic peripheral angiopathy without gangrene: Secondary | ICD-10-CM | POA: Diagnosis not present

## 2015-09-15 DIAGNOSIS — I120 Hypertensive chronic kidney disease with stage 5 chronic kidney disease or end stage renal disease: Secondary | ICD-10-CM | POA: Diagnosis not present

## 2015-09-15 DIAGNOSIS — Z8673 Personal history of transient ischemic attack (TIA), and cerebral infarction without residual deficits: Secondary | ICD-10-CM

## 2015-09-15 DIAGNOSIS — Z7901 Long term (current) use of anticoagulants: Secondary | ICD-10-CM | POA: Diagnosis not present

## 2015-09-15 DIAGNOSIS — Z4781 Encounter for orthopedic aftercare following surgical amputation: Secondary | ICD-10-CM | POA: Diagnosis not present

## 2015-09-15 LAB — POCT INR: INR: 1.3

## 2015-09-16 ENCOUNTER — Telehealth: Payer: Self-pay | Admitting: Internal Medicine

## 2015-09-16 ENCOUNTER — Telehealth: Payer: Self-pay | Admitting: Family Medicine

## 2015-09-16 ENCOUNTER — Other Ambulatory Visit: Payer: Self-pay | Admitting: *Deleted

## 2015-09-16 DIAGNOSIS — N2581 Secondary hyperparathyroidism of renal origin: Secondary | ICD-10-CM | POA: Diagnosis not present

## 2015-09-16 DIAGNOSIS — N186 End stage renal disease: Secondary | ICD-10-CM | POA: Diagnosis not present

## 2015-09-16 DIAGNOSIS — I739 Peripheral vascular disease, unspecified: Secondary | ICD-10-CM

## 2015-09-16 DIAGNOSIS — E1129 Type 2 diabetes mellitus with other diabetic kidney complication: Secondary | ICD-10-CM | POA: Diagnosis not present

## 2015-09-16 DIAGNOSIS — D631 Anemia in chronic kidney disease: Secondary | ICD-10-CM | POA: Diagnosis not present

## 2015-09-16 NOTE — Telephone Encounter (Signed)
Verbal orders given to [facility] with [facility] for patient to continue Occupational Therapy services once/week x 4 weeks for strengthening and to increase independence with activities of daily living. No additional needs identified.

## 2015-09-16 NOTE — Telephone Encounter (Signed)
Requesting verbal orders:  Continue occupational therapy 1 time a week for 4 weeks for strengthening to increase independence ADL

## 2015-09-16 NOTE — Telephone Encounter (Signed)
Received after hours call. Maria from [place] calling regarding PT Called back and left VM with VO to continue PT

## 2015-09-19 DIAGNOSIS — N186 End stage renal disease: Secondary | ICD-10-CM | POA: Diagnosis not present

## 2015-09-19 DIAGNOSIS — E1129 Type 2 diabetes mellitus with other diabetic kidney complication: Secondary | ICD-10-CM | POA: Diagnosis not present

## 2015-09-19 DIAGNOSIS — D631 Anemia in chronic kidney disease: Secondary | ICD-10-CM | POA: Diagnosis not present

## 2015-09-19 DIAGNOSIS — N2581 Secondary hyperparathyroidism of renal origin: Secondary | ICD-10-CM | POA: Diagnosis not present

## 2015-09-20 DIAGNOSIS — Z7901 Long term (current) use of anticoagulants: Secondary | ICD-10-CM | POA: Diagnosis not present

## 2015-09-20 DIAGNOSIS — E1151 Type 2 diabetes mellitus with diabetic peripheral angiopathy without gangrene: Secondary | ICD-10-CM | POA: Diagnosis not present

## 2015-09-20 DIAGNOSIS — Z4781 Encounter for orthopedic aftercare following surgical amputation: Secondary | ICD-10-CM | POA: Diagnosis not present

## 2015-09-20 DIAGNOSIS — I4891 Unspecified atrial fibrillation: Secondary | ICD-10-CM | POA: Diagnosis not present

## 2015-09-20 DIAGNOSIS — I120 Hypertensive chronic kidney disease with stage 5 chronic kidney disease or end stage renal disease: Secondary | ICD-10-CM | POA: Diagnosis not present

## 2015-09-20 DIAGNOSIS — N186 End stage renal disease: Secondary | ICD-10-CM | POA: Diagnosis not present

## 2015-09-21 ENCOUNTER — Ambulatory Visit (INDEPENDENT_AMBULATORY_CARE_PROVIDER_SITE_OTHER): Payer: Medicare Other | Admitting: Pharmacist Clinician (PhC)/ Clinical Pharmacy Specialist

## 2015-09-21 DIAGNOSIS — Z8673 Personal history of transient ischemic attack (TIA), and cerebral infarction without residual deficits: Secondary | ICD-10-CM

## 2015-09-21 DIAGNOSIS — I482 Chronic atrial fibrillation: Secondary | ICD-10-CM | POA: Diagnosis not present

## 2015-09-21 DIAGNOSIS — E1129 Type 2 diabetes mellitus with other diabetic kidney complication: Secondary | ICD-10-CM | POA: Diagnosis not present

## 2015-09-21 DIAGNOSIS — N186 End stage renal disease: Secondary | ICD-10-CM | POA: Diagnosis not present

## 2015-09-21 DIAGNOSIS — D631 Anemia in chronic kidney disease: Secondary | ICD-10-CM | POA: Diagnosis not present

## 2015-09-21 DIAGNOSIS — I4891 Unspecified atrial fibrillation: Secondary | ICD-10-CM

## 2015-09-21 DIAGNOSIS — N2581 Secondary hyperparathyroidism of renal origin: Secondary | ICD-10-CM | POA: Diagnosis not present

## 2015-09-21 DIAGNOSIS — Z992 Dependence on renal dialysis: Secondary | ICD-10-CM | POA: Diagnosis not present

## 2015-09-21 LAB — POCT INR: INR: 1.1

## 2015-09-22 ENCOUNTER — Ambulatory Visit (INDEPENDENT_AMBULATORY_CARE_PROVIDER_SITE_OTHER): Payer: Medicare Other | Admitting: Pharmacist

## 2015-09-22 DIAGNOSIS — N186 End stage renal disease: Secondary | ICD-10-CM | POA: Diagnosis not present

## 2015-09-22 DIAGNOSIS — I120 Hypertensive chronic kidney disease with stage 5 chronic kidney disease or end stage renal disease: Secondary | ICD-10-CM | POA: Diagnosis not present

## 2015-09-22 DIAGNOSIS — Z8673 Personal history of transient ischemic attack (TIA), and cerebral infarction without residual deficits: Secondary | ICD-10-CM

## 2015-09-22 DIAGNOSIS — I4891 Unspecified atrial fibrillation: Secondary | ICD-10-CM | POA: Diagnosis not present

## 2015-09-22 DIAGNOSIS — Z4781 Encounter for orthopedic aftercare following surgical amputation: Secondary | ICD-10-CM | POA: Diagnosis not present

## 2015-09-22 DIAGNOSIS — E1151 Type 2 diabetes mellitus with diabetic peripheral angiopathy without gangrene: Secondary | ICD-10-CM | POA: Diagnosis not present

## 2015-09-22 DIAGNOSIS — Z7901 Long term (current) use of anticoagulants: Secondary | ICD-10-CM | POA: Diagnosis not present

## 2015-09-22 LAB — POCT INR: INR: 1.4

## 2015-09-23 DIAGNOSIS — E1129 Type 2 diabetes mellitus with other diabetic kidney complication: Secondary | ICD-10-CM | POA: Diagnosis not present

## 2015-09-23 DIAGNOSIS — N186 End stage renal disease: Secondary | ICD-10-CM | POA: Diagnosis not present

## 2015-09-23 DIAGNOSIS — N2581 Secondary hyperparathyroidism of renal origin: Secondary | ICD-10-CM | POA: Diagnosis not present

## 2015-09-23 DIAGNOSIS — D509 Iron deficiency anemia, unspecified: Secondary | ICD-10-CM | POA: Diagnosis not present

## 2015-09-26 ENCOUNTER — Ambulatory Visit: Payer: Medicare Other | Admitting: Family Medicine

## 2015-09-28 ENCOUNTER — Other Ambulatory Visit: Payer: Self-pay | Admitting: Pharmacist Clinician (PhC)/ Clinical Pharmacy Specialist

## 2015-09-28 ENCOUNTER — Ambulatory Visit (INDEPENDENT_AMBULATORY_CARE_PROVIDER_SITE_OTHER): Payer: Medicare Other | Admitting: Pharmacist Clinician (PhC)/ Clinical Pharmacy Specialist

## 2015-09-28 DIAGNOSIS — I482 Chronic atrial fibrillation: Secondary | ICD-10-CM | POA: Diagnosis not present

## 2015-09-28 DIAGNOSIS — N186 End stage renal disease: Secondary | ICD-10-CM | POA: Diagnosis not present

## 2015-09-28 DIAGNOSIS — I4891 Unspecified atrial fibrillation: Secondary | ICD-10-CM

## 2015-09-28 DIAGNOSIS — Z4781 Encounter for orthopedic aftercare following surgical amputation: Secondary | ICD-10-CM | POA: Diagnosis not present

## 2015-09-28 DIAGNOSIS — Z8673 Personal history of transient ischemic attack (TIA), and cerebral infarction without residual deficits: Secondary | ICD-10-CM

## 2015-09-28 DIAGNOSIS — I120 Hypertensive chronic kidney disease with stage 5 chronic kidney disease or end stage renal disease: Secondary | ICD-10-CM | POA: Diagnosis not present

## 2015-09-28 DIAGNOSIS — Z7901 Long term (current) use of anticoagulants: Secondary | ICD-10-CM | POA: Diagnosis not present

## 2015-09-28 DIAGNOSIS — E1151 Type 2 diabetes mellitus with diabetic peripheral angiopathy without gangrene: Secondary | ICD-10-CM | POA: Diagnosis not present

## 2015-09-28 LAB — POCT INR: INR: 1.8

## 2015-09-28 MED ORDER — WARFARIN SODIUM 6 MG PO TABS: ORAL_TABLET | ORAL | Status: DC

## 2015-09-29 DIAGNOSIS — Z7901 Long term (current) use of anticoagulants: Secondary | ICD-10-CM | POA: Diagnosis not present

## 2015-09-29 DIAGNOSIS — I120 Hypertensive chronic kidney disease with stage 5 chronic kidney disease or end stage renal disease: Secondary | ICD-10-CM | POA: Diagnosis not present

## 2015-09-29 DIAGNOSIS — E1151 Type 2 diabetes mellitus with diabetic peripheral angiopathy without gangrene: Secondary | ICD-10-CM | POA: Diagnosis not present

## 2015-09-29 DIAGNOSIS — Z4781 Encounter for orthopedic aftercare following surgical amputation: Secondary | ICD-10-CM | POA: Diagnosis not present

## 2015-09-29 DIAGNOSIS — N186 End stage renal disease: Secondary | ICD-10-CM | POA: Diagnosis not present

## 2015-09-29 DIAGNOSIS — I4891 Unspecified atrial fibrillation: Secondary | ICD-10-CM | POA: Diagnosis not present

## 2015-09-29 LAB — PROTIME-INR

## 2015-10-05 ENCOUNTER — Ambulatory Visit (INDEPENDENT_AMBULATORY_CARE_PROVIDER_SITE_OTHER): Payer: Medicare Other | Admitting: Pharmacist

## 2015-10-05 DIAGNOSIS — Z7901 Long term (current) use of anticoagulants: Secondary | ICD-10-CM | POA: Diagnosis not present

## 2015-10-05 DIAGNOSIS — I4891 Unspecified atrial fibrillation: Secondary | ICD-10-CM | POA: Diagnosis not present

## 2015-10-05 DIAGNOSIS — E1151 Type 2 diabetes mellitus with diabetic peripheral angiopathy without gangrene: Secondary | ICD-10-CM | POA: Diagnosis not present

## 2015-10-05 DIAGNOSIS — I120 Hypertensive chronic kidney disease with stage 5 chronic kidney disease or end stage renal disease: Secondary | ICD-10-CM | POA: Diagnosis not present

## 2015-10-05 DIAGNOSIS — Z8673 Personal history of transient ischemic attack (TIA), and cerebral infarction without residual deficits: Secondary | ICD-10-CM

## 2015-10-05 DIAGNOSIS — Z4781 Encounter for orthopedic aftercare following surgical amputation: Secondary | ICD-10-CM | POA: Diagnosis not present

## 2015-10-05 DIAGNOSIS — N186 End stage renal disease: Secondary | ICD-10-CM | POA: Diagnosis not present

## 2015-10-05 DIAGNOSIS — I482 Chronic atrial fibrillation: Secondary | ICD-10-CM | POA: Diagnosis not present

## 2015-10-05 LAB — POCT INR: INR: 2.3

## 2015-10-06 DIAGNOSIS — Z7901 Long term (current) use of anticoagulants: Secondary | ICD-10-CM | POA: Diagnosis not present

## 2015-10-06 DIAGNOSIS — N186 End stage renal disease: Secondary | ICD-10-CM | POA: Diagnosis not present

## 2015-10-06 DIAGNOSIS — Z4781 Encounter for orthopedic aftercare following surgical amputation: Secondary | ICD-10-CM | POA: Diagnosis not present

## 2015-10-06 DIAGNOSIS — I120 Hypertensive chronic kidney disease with stage 5 chronic kidney disease or end stage renal disease: Secondary | ICD-10-CM | POA: Diagnosis not present

## 2015-10-06 DIAGNOSIS — I4891 Unspecified atrial fibrillation: Secondary | ICD-10-CM | POA: Diagnosis not present

## 2015-10-06 DIAGNOSIS — E1151 Type 2 diabetes mellitus with diabetic peripheral angiopathy without gangrene: Secondary | ICD-10-CM | POA: Diagnosis not present

## 2015-10-11 ENCOUNTER — Encounter: Payer: Self-pay | Admitting: Internal Medicine

## 2015-10-11 ENCOUNTER — Ambulatory Visit: Payer: Medicare Other | Attending: Family Medicine | Admitting: Internal Medicine

## 2015-10-11 VITALS — BP 126/77 | HR 101 | Temp 98.2°F | Ht 62.0 in

## 2015-10-11 DIAGNOSIS — I480 Paroxysmal atrial fibrillation: Secondary | ICD-10-CM | POA: Diagnosis not present

## 2015-10-11 DIAGNOSIS — I70262 Atherosclerosis of native arteries of extremities with gangrene, left leg: Secondary | ICD-10-CM

## 2015-10-11 DIAGNOSIS — Z89512 Acquired absence of left leg below knee: Secondary | ICD-10-CM | POA: Diagnosis not present

## 2015-10-11 DIAGNOSIS — I120 Hypertensive chronic kidney disease with stage 5 chronic kidney disease or end stage renal disease: Secondary | ICD-10-CM | POA: Diagnosis not present

## 2015-10-11 DIAGNOSIS — I739 Peripheral vascular disease, unspecified: Secondary | ICD-10-CM | POA: Diagnosis not present

## 2015-10-11 DIAGNOSIS — E1151 Type 2 diabetes mellitus with diabetic peripheral angiopathy without gangrene: Secondary | ICD-10-CM

## 2015-10-11 DIAGNOSIS — Z992 Dependence on renal dialysis: Secondary | ICD-10-CM

## 2015-10-11 DIAGNOSIS — Z4781 Encounter for orthopedic aftercare following surgical amputation: Secondary | ICD-10-CM | POA: Diagnosis not present

## 2015-10-11 DIAGNOSIS — Z7901 Long term (current) use of anticoagulants: Secondary | ICD-10-CM | POA: Diagnosis not present

## 2015-10-11 DIAGNOSIS — N186 End stage renal disease: Secondary | ICD-10-CM | POA: Diagnosis not present

## 2015-10-11 DIAGNOSIS — I4891 Unspecified atrial fibrillation: Secondary | ICD-10-CM | POA: Diagnosis not present

## 2015-10-11 LAB — GLUCOSE, POCT (MANUAL RESULT ENTRY): POC GLUCOSE: 286 mg/dL — AB (ref 70–99)

## 2015-10-11 LAB — POCT GLYCOSYLATED HEMOGLOBIN (HGB A1C): HEMOGLOBIN A1C: 6.4

## 2015-10-11 NOTE — Patient Instructions (Addendum)
It was nice meeting you. Please try to get earlier appt w/ you Vascular surgeon to look at your knee from recent fall.   Diabetes Mellitus and Food It is important for you to manage your blood sugar (glucose) level. Your blood glucose level can be greatly affected by what you eat. Eating healthier foods in the appropriate amounts throughout the day at about the same time each day will help you control your blood glucose level. It can also help slow or prevent worsening of your diabetes mellitus. Healthy eating may even help you improve the level of your blood pressure and reach or maintain a healthy weight.  General recommendations for healthful eating and cooking habits include:  Eating meals and snacks regularly. Avoid going long periods of time without eating to lose weight.  Eating a diet that consists mainly of plant-based foods, such as fruits, vegetables, nuts, legumes, and whole grains.  Using low-heat cooking methods, such as baking, instead of high-heat cooking methods, such as deep frying. Work with your dietitian to make sure you understand how to use the Nutrition Facts information on food labels. HOW CAN FOOD AFFECT ME? Carbohydrates Carbohydrates affect your blood glucose level more than any other type of food. Your dietitian will help you determine how many carbohydrates to eat at each meal and teach you how to count carbohydrates. Counting carbohydrates is important to keep your blood glucose at a healthy level, especially if you are using insulin or taking certain medicines for diabetes mellitus. Alcohol Alcohol can cause sudden decreases in blood glucose (hypoglycemia), especially if you use insulin or take certain medicines for diabetes mellitus. Hypoglycemia can be a life-threatening condition. Symptoms of hypoglycemia (sleepiness, dizziness, and disorientation) are similar to symptoms of having too much alcohol.  If your health care provider has given you approval to drink  alcohol, do so in moderation and use the following guidelines:  Women should not have more than one drink per day, and men should not have more than two drinks per day. One drink is equal to:  12 oz of beer.  5 oz of wine.  1 oz of hard liquor.  Do not drink on an empty stomach.  Keep yourself hydrated. Have water, diet soda, or unsweetened iced tea.  Regular soda, juice, and other mixers might contain a lot of carbohydrates and should be counted. WHAT FOODS ARE NOT RECOMMENDED? As you make food choices, it is important to remember that all foods are not the same. Some foods have fewer nutrients per serving than other foods, even though they might have the same number of calories or carbohydrates. It is difficult to get your body what it needs when you eat foods with fewer nutrients. Examples of foods that you should avoid that are high in calories and carbohydrates but low in nutrients include:  Trans fats (most processed foods list trans fats on the Nutrition Facts label).  Regular soda.  Juice.  Candy.  Sweets, such as cake, pie, doughnuts, and cookies.  Fried foods. WHAT FOODS CAN I EAT? Eat nutrient-rich foods, which will nourish your body and keep you healthy. The food you should eat also will depend on several factors, including:  The calories you need.  The medicines you take.  Your weight.  Your blood glucose level.  Your blood pressure level.  Your cholesterol level. You should eat a variety of foods, including:  Protein.  Lean cuts of meat.  Proteins low in saturated fats, such as fish, egg whites,  and beans. Avoid processed meats.  Fruits and vegetables.  Fruits and vegetables that may help control blood glucose levels, such as apples, mangoes, and yams.  Dairy products.  Choose fat-free or low-fat dairy products, such as milk, yogurt, and cheese.  Grains, bread, pasta, and rice.  Choose whole grain products, such as multigrain bread, whole  oats, and brown rice. These foods may help control blood pressure.  Fats.  Foods containing healthful fats, such as nuts, avocado, olive oil, canola oil, and fish. DOES EVERYONE WITH DIABETES MELLITUS HAVE THE SAME MEAL PLAN? Because every person with diabetes mellitus is different, there is not one meal plan that works for everyone. It is very important that you meet with a dietitian who will help you create a meal plan that is just right for you.   This information is not intended to replace advice given to you by your health care provider. Make sure you discuss any questions you have with your health care provider.   Document Released: 01/04/2005 Document Revised: 04/30/2014 Document Reviewed: 03/06/2013 Elsevier Interactive Patient Education 2016 [place] for Eating Away From Home If You Have Diabetes Controlling your level of blood glucose, also known as blood sugar, can be challenging. It can be even more difficult when you do not prepare your own meals. The following tips can help you manage your diabetes when you eat away from home. PLANNING AHEAD Plan ahead if you know you will be eating away from home:  Ask your health care provider how to time meals and medicine if you are taking insulin.  Make a list of restaurants near you that offer healthy choices. If they have a carry-out menu, take it home and plan what you will order ahead of time.  Look up the restaurant you want to eat at online. Many chain and fast-food restaurants list nutritional information online. Use this information to choose the healthiest options and to calculate how many carbohydrates will be in your meal.  Use a carbohydrate-counting book or mobile app to look up the carbohydrate content and serving size of the foods you want to eat.  Become familiar with serving sizes and learn to recognize how many servings are in a portion. This will allow you to estimate how many carbohydrates you can  eat. FREE FOODS A "free food" is any food or drink that has less than 5 g of carbohydrates per serving. Free foods include:  Many vegetables.  Hard boiled eggs.  Nuts or seeds.  Olives.  Cheeses.  Meats. These types of foods make good appetizer choices and are often available at salad bars. Lemon juice, vinegar, or a low-calorie salad dressing of fewer than 20 calories per serving can be used as a "free" salad dressing.  CHOICES TO REDUCE CARBOHYDRATES  Substitute nonfat sweetened yogurt with a sugar-free yogurt. Yogurt made from soy milk may also be used, but you will still want a sugar-free or plain option to choose a lower carbohydrate amount.  Ask your server to take away the bread basket or chips from your table.  Order fresh fruit. A salad bar often offers fresh fruit choices. Avoid canned fruit because it is usually packed in sugar or syrup.  Order a salad, and eat it without dressing. Or, create a "free" salad dressing.  Ask for substitutions. For example, instead of [place] fries, request an order of a vegetable such as salad, green beans, or broccoli. OTHER TIPS   If you take insulin,  take the insulin once your food arrives to your table. This will ensure your insulin and food are timed correctly.  Ask your server about the portion size before your order, and ask for a take-out box if the portion has more servings than you should have. When your food comes, leave the amount you should have on the plate, and put the rest in the take-out box.  Consider splitting an entree with someone and ordering a side salad.   This information is not intended to replace advice given to you by your health care provider. Make sure you discuss any questions you have with your health care provider.   Document Released: 04/09/2005 Document Revised: 12/29/2014 Document Reviewed: 07/07/2013 Elsevier Interactive Patient Education [employer].

## 2015-10-11 NOTE — Progress Notes (Signed)
[name], is a 55 y.o. female  [id number]  [id number]  DOB - 1960-05-15  Chief Complaint  Patient presents with  . Establish Care    Home from nursing home  . Fall    2 weeks ago and hurt left leg. Taking Motrin for the pain        Subjective:   [name] is a 55 y.o. female here today for a follow up visit, last seen in clinic 1/17, w/ significant pmhx of dm2 w/ nephropathy, esrd on HD M/w/f,  PAF on coumadin, htn, cva 2002, dm, hyperparathyroidism.  Sp L BKA 06/28/15 by Dr Deitra Mayo.    Per pt, she fell on her stump about 2 wks ago, believes there is still some bruising there, still constant hurts when she touches it. Thinks she feels a knot.  She has been working to get prosthetics placed, but the pain is preventing it some.  Pt states she only tolerates glipizide 5mg , any higher and her sugar drops. Does not want any insulin. Prior to arrival, she just ate lunch.  Patient has No headache, No chest pain, No abdominal pain - No Nausea, No new weakness tingling or numbness, No Cough - SOB.  No problems updated.  ALLERGIES: Allergies  Allergen Reactions  . Doxycycline Itching  . Peroxyl [Hydrogen Peroxide] Hives and Rash    PAST MEDICAL HISTORY: Past Medical History  Diagnosis Date  . Hypertension   . CVA (cerebral infarction)     right parietal 05/19/2000  . Vaginal bleeding   . Hyperparathyroidism   . Pneumonia   . Stroke ([place])     2002,no residual  . Peripheral vascular disease ([place])   . GERD (gastroesophageal reflux disease)   . DM (diabetes mellitus) type II controlled peripheral vascular disorder ([place])   . Slip/trip w/o falling due to stepping on object, subs July  2014  . ESRD (end stage renal disease) ([place])     dialysis M/W/F  . Constipation   . Complication of anesthesia     Morphine makes her nauseated, states she has small veins  . Atrial fibrillation ([place])   . Anemia     MEDICATIONS AT HOME: Prior to Admission  medications   Medication Sig Start Date End Date Taking? Authorizing Provider  acetaminophen (TYLENOL) 325 MG tablet Take 650 mg by mouth every Monday, Wednesday, and Friday. With dialysis   Yes Historical Provider, MD  B Complex-C-Folic Acid (RENA-VITE RX) 1 MG TABS Take 1 mg by mouth daily. 05/26/15  Yes Historical Provider, MD  CALCIUM-VITAMIN D PO Take 500 mg by mouth 3 (three) times daily with meals.    Yes Historical Provider, MD  Cyanocobalamin (VITAMIN B-12 PO) Take 1 tablet by mouth 4 (four) times a week. Non dialysis days. Cain Sau[place], Sat and Sunday   Yes Historical Provider, MD  diazepam (VALIUM) 5 MG tablet Take 5 mg by mouth every 8 (eight) hours as needed for muscle spasms.  11/15/14  Yes Historical Provider, MD  diltiazem (CARDIZEM CD) 120 MG 24 hr capsule Take 1 capsule (120 mg total) by mouth daily. 03/27/15  Yes Shanker Kristeen Mans, MD  diphenhydrAMINE (BENADRYL) 25 MG tablet Take 25 mg by mouth every 6 (six) hours as needed (Muscle spasm in feet).   Yes Historical Provider, MD  glipiZIDE (GLUCOTROL) 10 MG tablet Take 0.5 tablets (5 mg total) by mouth daily. 11/23/14  Yes Lance Bosch, NP  ibuprofen (ADVIL,MOTRIN) 800 MG tablet Take 800 mg by mouth every  8 (eight) hours as needed.   Yes Historical Provider, MD  oxyCODONE (OXY IR/ROXICODONE) 5 MG immediate release tablet Take 1-2 tablets (5-10 mg total) by mouth every 4 (four) hours as needed for moderate pain. 07/02/15  Yes Ulyses Amor, PA-C  polyethylene glycol (MIRALAX / GLYCOLAX) packet Take 17 g by mouth daily. Patient taking differently: Take 17 g by mouth daily as needed for mild constipation.  08/19/14  Yes Reyne Dumas, MD  traMADol (ULTRAM) 50 MG tablet Take 1-2 mg by mouth every 6 (six) hours as needed. Reported on 08/10/2015   Yes Historical Provider, MD  vitamin C (ASCORBIC ACID) 500 MG tablet Take 500 mg by mouth daily.   Yes Historical Provider, MD  vitamin E 400 UNIT capsule Take 400 Units by mouth daily.   Yes Historical  Provider, MD  warfarin (COUMADIN) 6 MG tablet Take 1 tablet by mouth daily or as directed by coumadin clinic 09/28/15  Yes Leonie Man, MD     Objective:   Filed Vitals:   10/11/15 1400  BP: 126/77  Pulse: 101  Temp: 98.2 F (36.8 C)  TempSrc: Oral  Height: 5\' 2"  (1.575 m)  SpO2: 99%    Exam General appearance : Awake, alert, not in any distress. Speech Clear. Not toxic looking, disheveled, stubble noted, poor eye contact - will not look at me throughout exam, continues to touch her left stump,  HEENT: Atraumatic and Normocephalic, pupils equally reactive to light. Neck: supple, no JVD. No cervical lymphadenopathy.  Chest:Good air entry bilaterally, no added sounds. CVS: S1 S2 mild tachycardic, no murmurs/gallups or rubs. Abdomen: Bowel sounds active, obese,  Non tender. Extremities: Sp left bka, sutures dry appearing, no exudate/fluctulance/discharge/erythema noted, mild diffuse ttp diffusely over stump, but no noticable echymosis/induration. Neurology: Awake alert, and oriented X 3, CN II-XII grossly intact, Non focal Skin:No Rash  Data Review Lab Results  Component Value Date   HGBA1C 6.4 10/11/2015   HGBA1C 7.7* 06/28/2015   HGBA1C 7.4* 03/25/2015    Depression screen PHQ 2/9 11/23/2014  Decreased Interest 0  Down, Depressed, Hopeless 0  PHQ - 2 Score 0      Assessment & Plan   1. DM (diabetes mellitus) type II controlled peripheral vascular disorder (HCC) Continue glipizide 5qd - POCT glucose (manual entry) 286 - POCT glycosylated hemoglobin (Hb A1C) 6.4 - Ambulatory referral to Ophthalmology  - has not had eye exam in years.  2. Peripheral vascular disease, unspecified ([place]) - sp Left bka, w/ recent fall, encouraged Pt to see if can see Vascular surgeon sooner than Nov appt.  3. Paroxysmal atrial fibrillation (HCC) - on cardizme cd 120 qd, and coumadin, last inr 6/14 was 2.3 - encouraged to take meds as prescribed  4. S/P BKA (below knee amputation)  unilateral, left ([place]) See above  5. End stage renal disease on dialysis [facility]) - patient is a hard stick, gets blood draws on HD - hd m/w/f.  6. Depression? Poor eye contact, pt denies si/hi/avh, did not want any rx .  Patient have been counseled extensively about nutrition and exercise  Return in about 3 weeks (around 11/01/2015) for pap smear.  The patient was given clear instructions to go to ER or return to medical center if symptoms don't improve, worsen or new problems develop. The patient verbalized understanding. The patient was told to call to get lab results if they haven't heard anything in the next week.    Maren Reamer, MD, MBA/MHA [facility] and [place], [place]   10/11/2015, 2:37 PM

## 2015-10-12 ENCOUNTER — Encounter: Payer: Self-pay | Admitting: Internal Medicine

## 2015-10-12 ENCOUNTER — Ambulatory Visit (INDEPENDENT_AMBULATORY_CARE_PROVIDER_SITE_OTHER): Payer: Medicare Other | Admitting: Pharmacist Clinician (PhC)/ Clinical Pharmacy Specialist

## 2015-10-12 DIAGNOSIS — Z7901 Long term (current) use of anticoagulants: Secondary | ICD-10-CM | POA: Diagnosis not present

## 2015-10-12 DIAGNOSIS — E1151 Type 2 diabetes mellitus with diabetic peripheral angiopathy without gangrene: Secondary | ICD-10-CM | POA: Diagnosis not present

## 2015-10-12 DIAGNOSIS — Z4781 Encounter for orthopedic aftercare following surgical amputation: Secondary | ICD-10-CM | POA: Diagnosis not present

## 2015-10-12 DIAGNOSIS — I120 Hypertensive chronic kidney disease with stage 5 chronic kidney disease or end stage renal disease: Secondary | ICD-10-CM | POA: Diagnosis not present

## 2015-10-12 DIAGNOSIS — Z8673 Personal history of transient ischemic attack (TIA), and cerebral infarction without residual deficits: Secondary | ICD-10-CM

## 2015-10-12 DIAGNOSIS — N186 End stage renal disease: Secondary | ICD-10-CM | POA: Diagnosis not present

## 2015-10-12 DIAGNOSIS — I4891 Unspecified atrial fibrillation: Secondary | ICD-10-CM

## 2015-10-12 DIAGNOSIS — I482 Chronic atrial fibrillation: Secondary | ICD-10-CM | POA: Diagnosis not present

## 2015-10-12 LAB — POCT INR: INR: 2.4

## 2015-10-13 ENCOUNTER — Ambulatory Visit: Payer: Medicare Other | Admitting: Physician Assistant

## 2015-10-13 ENCOUNTER — Telehealth: Payer: Self-pay

## 2015-10-13 NOTE — Telephone Encounter (Signed)
Phone call from pt.  Reported she fell approx. 2 weeks ago, hitting her left BKA stump.  Reported the left stump is bruised and sore, with some swelling.  Reported there is a scab on lateral stump incision.  Denied any open sores, fever/ chills.  Stated she can't get fit for her prosthesis, due to the stump being sore.  Reported she was advised by her PCP to have this looked at by vascular surgeon.  Appt. Offered for 6/28 @ 2:45 PM with NP.  Agrees with plan.

## 2015-10-14 ENCOUNTER — Telehealth: Payer: Self-pay | Admitting: Cardiology

## 2015-10-14 DIAGNOSIS — N186 End stage renal disease: Secondary | ICD-10-CM | POA: Diagnosis not present

## 2015-10-14 DIAGNOSIS — E1151 Type 2 diabetes mellitus with diabetic peripheral angiopathy without gangrene: Secondary | ICD-10-CM | POA: Diagnosis not present

## 2015-10-14 DIAGNOSIS — I4891 Unspecified atrial fibrillation: Secondary | ICD-10-CM | POA: Diagnosis not present

## 2015-10-14 DIAGNOSIS — Z7901 Long term (current) use of anticoagulants: Secondary | ICD-10-CM | POA: Diagnosis not present

## 2015-10-14 DIAGNOSIS — I120 Hypertensive chronic kidney disease with stage 5 chronic kidney disease or end stage renal disease: Secondary | ICD-10-CM | POA: Diagnosis not present

## 2015-10-14 DIAGNOSIS — Z4781 Encounter for orthopedic aftercare following surgical amputation: Secondary | ICD-10-CM | POA: Diagnosis not present

## 2015-10-14 NOTE — Telephone Encounter (Signed)
Faxed orders signed by MD regarding PT/INR & warfarin.

## 2015-10-18 ENCOUNTER — Encounter (HOSPITAL_COMMUNITY): Payer: Self-pay | Admitting: Emergency Medicine

## 2015-10-18 ENCOUNTER — Ambulatory Visit (HOSPITAL_COMMUNITY)
Admission: EM | Admit: 2015-10-18 | Discharge: 2015-10-18 | Disposition: A | Discharge status: Home / Self Care | Payer: Medicare Other | Attending: Family Medicine | Admitting: Family Medicine

## 2015-10-18 ENCOUNTER — Encounter: Payer: Self-pay | Admitting: Vascular Surgery

## 2015-10-18 DIAGNOSIS — T8789 Other complications of amputation stump: Secondary | ICD-10-CM

## 2015-10-18 DIAGNOSIS — M79605 Pain in left leg: Secondary | ICD-10-CM | POA: Diagnosis not present

## 2015-10-18 NOTE — ED Provider Notes (Signed)
CSN: [id number]     Arrival date & time 10/18/15  1535 History   First MD Initiated Contact with Patient 10/18/15 1606     Chief Complaint  Patient presents with  . Fall   (Consider location/radiation/quality/duration/timing/severity/associated sxs/prior Treatment) Patient is a 55 y.o. female presenting with fall. The history is provided by the patient and a relative.  Fall This is a new problem. The current episode started more than 1 week ago (fell 3 wks ago, c/o pain and swelling to bka stump since.). The problem has not changed since onset.   Past Medical History  Diagnosis Date  . Hypertension   . CVA (cerebral infarction)     right parietal 05/19/2000  . Vaginal bleeding   . Hyperparathyroidism   . Pneumonia   . Stroke ([place])     2002,no residual  . Peripheral vascular disease ([place])   . GERD (gastroesophageal reflux disease)   . DM (diabetes mellitus) type II controlled peripheral vascular disorder ([place])   . Slip/trip w/o falling due to stepping on object, subs July  2014  . ESRD (end stage renal disease) ([place])     dialysis M/W/F  . Constipation   . Complication of anesthesia     Morphine makes her nauseated, states she has small veins  . Atrial fibrillation ([place])   . Anemia    Past Surgical History  Procedure Laterality Date  . Parathyroidectomy with autotransplant  12/07/2010  . Knee surgery Right X 2  . Brachial artery graft      x2 for dialysis  . Av fistula placement Right     upper thigh  . Dental extractions Bilateral   . Dilation and curettage of uterus  1986  . Transmetatarsal amputation Left 07/15/2012    Procedure: TRANSMETATARSAL AMPUTATION;  Surgeon: Angelia Mould, MD;  Location: [facility] OR;  Service: Vascular;  Laterality: Left;  . Leg surgery Right   . Foot surgery Left     5 TOES AMPUTATED  . Abdominal aortagram N/A 05/27/2012    Procedure: ABDOMINAL Maxcine Ham;  Surgeon: Serafina Mitchell, MD;  Location: [facility] CATH LAB;  Service: Cardiovascular;   Laterality: N/A;  . Lower extremity angiogram Left 05/27/2012    Procedure: LOWER EXTREMITY ANGIOGRAM;  Surgeon: Serafina Mitchell, MD;  Location: [facility] CATH LAB;  Service: Cardiovascular;  Laterality: Left;  lt leg angio  . Tooth extraction  Sep 08, 2014  . Peripheral vascular catheterization N/A 01/11/2015    Procedure: Lower Extremity Angiography;  Surgeon: Serafina Mitchell, MD;  Location: [place] CV LAB;  Service: Cardiovascular;  Laterality: N/A;  . I&d extremity Left 05/05/2015    Procedure: DEBRIDEMENT HEEL WOUND;  Surgeon: Angelia Mould, MD;  Location: [place];  Service: Vascular;  Laterality: Left;  . Application of wound vac Left 05/05/2015    Procedure: APPLICATION OF WOUND VAC;  Surgeon: Angelia Mould, MD;  Location: [place];  Service: Vascular;  Laterality: Left;  . Amputation Left 06/28/2015    Procedure:  Left BELOW KNEE amputation;  Surgeon: Angelia Mould, MD;  Location: [facility] OR;  Service: Vascular;  Laterality: Left;   Family History  Problem Relation Age of Onset  . Hypertension Father   . Kidney disease Mother    Social History  Substance Use Topics  . Smoking status: Never Smoker   . Smokeless tobacco: Never Used  . Alcohol Use: No   OB History    No data available     Review of Systems  Constitutional: Negative.  Negative for fever.  Musculoskeletal: Positive for gait problem.  Skin: Positive for wound.  All other systems reviewed and are negative.   Allergies  Doxycycline and Peroxyl  Home Medications   Prior to Admission medications   Medication Sig Start Date End Date Taking? Authorizing Provider  acetaminophen (TYLENOL) 325 MG tablet Take 650 mg by mouth every Monday, Wednesday, and Friday. With dialysis   Yes Historical Provider, MD  CALCIUM-VITAMIN D PO Take 500 mg by mouth 3 (three) times daily with meals.    Yes Historical Provider, MD  Cyanocobalamin (VITAMIN B-12 PO) Take 1 tablet by mouth 4 (four) times a week. Non dialysis days.  Cain Saupe, Sat and Sunday   Yes Historical Provider, MD  diazepam (VALIUM) 5 MG tablet Take 5 mg by mouth every 8 (eight) hours as needed for muscle spasms.  11/15/14  Yes Historical Provider, MD  diltiazem (CARDIZEM CD) 120 MG 24 hr capsule Take 1 capsule (120 mg total) by mouth daily. 03/27/15  Yes Shanker Kristeen Mans, MD  glipiZIDE (GLUCOTROL) 10 MG tablet Take 0.5 tablets (5 mg total) by mouth daily. 11/23/14  Yes Lance Bosch, NP  vitamin C (ASCORBIC ACID) 500 MG tablet Take 500 mg by mouth daily.   Yes Historical Provider, MD  vitamin E 400 UNIT capsule Take 400 Units by mouth daily.   Yes Historical Provider, MD  B Complex-C-Folic Acid (RENA-VITE RX) 1 MG TABS Take 1 mg by mouth daily. 05/26/15   Historical Provider, MD  diphenhydrAMINE (BENADRYL) 25 MG tablet Take 25 mg by mouth every 6 (six) hours as needed (Muscle spasm in feet).    Historical Provider, MD  ibuprofen (ADVIL,MOTRIN) 800 MG tablet Take 800 mg by mouth every 8 (eight) hours as needed.    Historical Provider, MD  oxyCODONE (OXY IR/ROXICODONE) 5 MG immediate release tablet Take 1-2 tablets (5-10 mg total) by mouth every 4 (four) hours as needed for moderate pain. 07/02/15   Ulyses Amor, PA-C  polyethylene glycol (MIRALAX / GLYCOLAX) packet Take 17 g by mouth daily. Patient taking differently: Take 17 g by mouth daily as needed for mild constipation.  08/19/14   Reyne Dumas, MD  traMADol (ULTRAM) 50 MG tablet Take 1-2 mg by mouth every 6 (six) hours as needed. Reported on 08/10/2015    Historical Provider, MD  warfarin (COUMADIN) 6 MG tablet Take 1 tablet by mouth daily or as directed by coumadin clinic 09/28/15   Leonie Man, MD   Meds Ordered and Administered this Visit  Medications - No data to display  BP 183/101 mmHg  Pulse 102  Temp(Src) 98 F (36.7 C) (Oral)  SpO2 99%  LMP  (LMP Unknown) No data found.   Physical Exam  Constitutional: She is oriented to person, place, and time. She appears well-developed and  well-nourished.  Musculoskeletal: She exhibits tenderness.  lbka stump sts and distal tenderness, min bleeding, no signs of infection.  Neurological: She is alert and oriented to person, place, and time.  Skin: Skin is warm and dry. No erythema.  Nursing note and vitals reviewed.   ED Course  Procedures (including critical care time)  Labs Review Labs Reviewed - No data to display  Imaging Review No results found.   Visual Acuity Review  Right Eye Distance:   Left Eye Distance:   Bilateral Distance:    Right Eye Near:   Left Eye Near:    Bilateral Near:  MDM   1. Pain of amputation stump of left lower extremity (HCC)    Family present and agrees with plan.    Billy Fischer, MD 10/18/15 [phone]

## 2015-10-18 NOTE — ED Notes (Signed)
Pt fell three weeks ago.  She has some minor bleeding from the incision site from her BKA on her left leg.  She also complains of a small knot on her shin just below her right knee and a bruise on her RLE.  Pt states she is unable to get in to see her orthopedist.

## 2015-10-18 NOTE — ED Notes (Signed)
Pt advised to take her BP medication as soon as she gets home as her BP is elevated today.

## 2015-10-18 NOTE — Discharge Instructions (Signed)
See your doctor on wed as scheduled.

## 2015-10-19 ENCOUNTER — Encounter: Payer: Self-pay | Admitting: Family

## 2015-10-19 ENCOUNTER — Ambulatory Visit (INDEPENDENT_AMBULATORY_CARE_PROVIDER_SITE_OTHER): Payer: Medicare Other | Admitting: Family

## 2015-10-19 VITALS — BP 150/84 | HR 96 | Temp 97.7°F | Resp 16 | Ht 62.0 in | Wt 168.0 lb

## 2015-10-19 DIAGNOSIS — N186 End stage renal disease: Secondary | ICD-10-CM

## 2015-10-19 DIAGNOSIS — Z89512 Acquired absence of left leg below knee: Secondary | ICD-10-CM

## 2015-10-19 DIAGNOSIS — Z992 Dependence on renal dialysis: Secondary | ICD-10-CM

## 2015-10-19 DIAGNOSIS — Z89522 Acquired absence of left knee: Secondary | ICD-10-CM

## 2015-10-19 DIAGNOSIS — T8789 Other complications of amputation stump: Secondary | ICD-10-CM | POA: Diagnosis not present

## 2015-10-19 DIAGNOSIS — I70262 Atherosclerosis of native arteries of extremities with gangrene, left leg: Secondary | ICD-10-CM

## 2015-10-19 DIAGNOSIS — W19XXXA Unspecified fall, initial encounter: Secondary | ICD-10-CM

## 2015-10-19 DIAGNOSIS — M79605 Pain in left leg: Secondary | ICD-10-CM

## 2015-10-19 DIAGNOSIS — I482 Chronic atrial fibrillation: Secondary | ICD-10-CM | POA: Diagnosis not present

## 2015-10-19 MED ORDER — GABAPENTIN 100 MG PO CAPS: 100.0000 mg | ORAL_CAPSULE | Freq: Three times a day (TID) | ORAL | Status: DC

## 2015-10-19 NOTE — Progress Notes (Signed)
[facility] [place]   CC: fell on left BKA stump, pain at stump   History of Present Illness [name] is a 55 y.o. female patient of Dr. 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 She is also on HD MWF via right thigh graft. She takes coumadin for a-fib.  She returns today after phone call on 10/13/15. Reported she fell approx. 2 weeks prior, hitting her left BKA stump. Reported the left stump is bruised and sore, with some swelling. Reported there is a scab on lateral stump incision. Denied any open sores, fever/ chills. Stated she can't get fit for her prosthesis, due to the stump being sore. Reported she was advised by her PCP to have this looked at by vascular surgeon. Pt states the left BKA hurts during HD since she fell on the left BKA, and she is not able to complete HD due to this. She is taking Tylenol alternating with tramadol for pain which is intermittent. The pain wakes her up in the middle of the night.  She does recall ever taking gabapentin or neurontin.  She has been [employer] but cannot proceed due to bleeding she has been having at the stump  She was evaluated in [facility] ED by Dr. Kellie Simmering on 07/27/15 for bleeding from right thigh graft. Her coumadin dose was decreased.   Pt Diabetic: Yes Pt smoker: non-smoker  Pt meds include: Statin :No Betablocker: No ASA: No Other anticoagulants/antiplatelets: coumadin for atrial fib  Past Medical History  Diagnosis Date  . Hypertension   . CVA (cerebral infarction)     right parietal 05/19/2000  . Vaginal bleeding   . Hyperparathyroidism   . Pneumonia   . Stroke ([place])     2002,no residual  . Peripheral vascular disease ([place])   . GERD (gastroesophageal reflux disease)   . DM (diabetes mellitus) type II controlled peripheral vascular disorder ([place])   . Slip/trip w/o falling due to stepping on object, subs July  2014  . ESRD (end stage renal disease) ([place])     dialysis M/W/F  . Constipation   .  Complication of anesthesia     Morphine makes her nauseated, states she has small veins  . Atrial fibrillation ([place])   . Anemia     Social History Social History  Substance Use Topics  . Smoking status: Never Smoker   . Smokeless tobacco: Never Used  . Alcohol Use: No    Family History Family History  Problem Relation Age of Onset  . Hypertension Father   . Kidney disease Mother     Past Surgical History  Procedure Laterality Date  . Parathyroidectomy with autotransplant  12/07/2010  . Knee surgery Right X 2  . Brachial artery graft      x2 for dialysis  . Av fistula placement Right     upper thigh  . Dental extractions Bilateral   . Dilation and curettage of uterus  1986  . Transmetatarsal amputation Left 07/15/2012    Procedure: TRANSMETATARSAL AMPUTATION;  Surgeon: Angelia Mould, MD;  Location: [facility] OR;  Service: Vascular;  Laterality: Left;  . Leg surgery Right   . Foot surgery Left     5 TOES AMPUTATED  . Abdominal aortagram N/A 05/27/2012    Procedure: ABDOMINAL Maxcine Ham;  Surgeon: Serafina Mitchell, MD;  Location: [facility] CATH LAB;  Service: Cardiovascular;  Laterality: N/A;  . Lower extremity angiogram Left 05/27/2012    Procedure: LOWER EXTREMITY ANGIOGRAM;  Surgeon: Serafina Mitchell,  MD;  Location: [place] CATH LAB;  Service: Cardiovascular;  Laterality: Left;  lt leg angio  . Tooth extraction  Sep 08, 2014  . Peripheral vascular catheterization N/A 01/11/2015    Procedure: Lower Extremity Angiography;  Surgeon: Serafina Mitchell, MD;  Location: [place] CV LAB;  Service: Cardiovascular;  Laterality: N/A;  . I&d extremity Left 05/05/2015    Procedure: DEBRIDEMENT HEEL WOUND;  Surgeon: Angelia Mould, MD;  Location: [place];  Service: Vascular;  Laterality: Left;  . Application of wound vac Left 05/05/2015    Procedure: APPLICATION OF WOUND VAC;  Surgeon: Angelia Mould, MD;  Location: [place];  Service: Vascular;  Laterality: Left;  . Amputation Left 06/28/2015     Procedure:  Left BELOW KNEE amputation;  Surgeon: Angelia Mould, MD;  Location: [place];  Service: Vascular;  Laterality: Left;    Allergies  Allergen Reactions  . Doxycycline Itching  . Peroxyl [Hydrogen Peroxide] Hives and Rash    Current Outpatient Prescriptions  Medication Sig Dispense Refill  . acetaminophen (TYLENOL) 325 MG tablet Take 650 mg by mouth every Monday, Wednesday, and Friday. With dialysis    . B Complex-C-Folic Acid (RENA-VITE RX) 1 MG TABS Take 1 mg by mouth daily.  1  . CALCIUM-VITAMIN D PO Take 500 mg by mouth 3 (three) times daily with meals.     . Cyanocobalamin (VITAMIN B-12 PO) Take 1 tablet by mouth 4 (four) times a week. Non dialysis days. Tues, Thurs, Sat and Sunday    . diazepam (VALIUM) 5 MG tablet Take 5 mg by mouth every 8 (eight) hours as needed for muscle spasms.   0  . diltiazem (CARDIZEM CD) 120 MG 24 hr capsule Take 1 capsule (120 mg total) by mouth daily. 60 capsule 0  . diphenhydrAMINE (BENADRYL) 25 MG tablet Take 25 mg by mouth every 6 (six) hours as needed (Muscle spasm in feet).    Marland Kitchen glipiZIDE (GLUCOTROL) 10 MG tablet Take 0.5 tablets (5 mg total) by mouth daily. 45 tablet 3  . ibuprofen (ADVIL,MOTRIN) 800 MG tablet Take 800 mg by mouth every 8 (eight) hours as needed.    Marland Kitchen oxyCODONE (OXY IR/ROXICODONE) 5 MG immediate release tablet Take 1-2 tablets (5-10 mg total) by mouth every 4 (four) hours as needed for moderate pain. 30 tablet 0  . polyethylene glycol (MIRALAX / GLYCOLAX) packet Take 17 g by mouth daily. (Patient taking differently: Take 17 g by mouth daily as needed for mild constipation. ) 14 each 0  . vitamin C (ASCORBIC ACID) 500 MG tablet Take 500 mg by mouth daily.    . vitamin E 400 UNIT capsule Take 400 Units by mouth daily.    Marland Kitchen warfarin (COUMADIN) 6 MG tablet Take 1 tablet by mouth daily or as directed by coumadin clinic 30 tablet 1  . traMADol (ULTRAM) 50 MG tablet Take 1-2 mg by mouth every 6 (six) hours as needed. Reported  on 10/19/2015     No current facility-administered medications for this visit.    ROS: See HPI for pertinent positives and negatives.   Physical Examination  Filed Vitals:   10/19/15 1602 10/19/15 1606  BP: 148/70 150/84  Pulse: 90 96  Temp: 97.7 F (36.5 C)   Resp: 16   Height: 5\' 2"  (1.575 m)   Weight: 168 lb (76.204 kg)   SpO2: 100%    Body mass index is 30.72 kg/(m^2).  General: A&O x 3, WDWN, obese female. Hirsute face. Gait:  seated in wheelchair Eyes: PERRLA. Pulmonary: Respirations are non labored Cardiac: regular rhythm, no detected murmur.         Carotid Bruits Right Left   Negative Negative  Aorta is not palpable. Radial pulses: 1+ palpable                           VASCULAR EXAM: Extremities without ischemic changes, without Gangrene; without open wounds. Left BKA with no bleeding, no drainage. There is scant dried blood on the dressing that was removed. There is a granulating area mid healed incision of left BKA. Left BKA is tender to touch, minimal swelling.  Palpable thrill at right thigh graft, no bleeding.                                                                                                           LE Pulses Right Left       FEMORAL  not palpable, pt seated in wheelchair  not palpable, pt seated in wheelchair        POPLITEAL  not palpable   not palpable       POSTERIOR TIBIAL  not palpable   BKA         DORSALIS PEDIS      ANTERIOR TIBIAL not palpable  BKA    Abdomen: soft, NT, no palpable masses. Skin: no rashes, no ulcers, see Extremities. Musculoskeletal: no muscle wasting or atrophy, see Extremities.  Neurologic: A&O X 3; Appropriate Affect ; SENSATION: normal; MOTOR FUNCTION:  moving all extremities equally, motor strength 5/5 throughout. Speech is fluent/normal, soft spoken.  CN 2-12 grossly intact.     ASSESSMENT: Montasia Klose is a 55 y.o. 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 She is also on HD MWF via right thigh  graft. She takes coumadin for a-fib.  She returns today after phone call on 10/13/15. Reported she fell approx. 2 weeks prior, hitting her left BKA stump. Reported the left stump is bruised and sore, with some swelling. Reported there is a scab on lateral stump incision. Denied any open sores, fever/ chills. Stated she can't get fit for her prosthesis, due to the stump being sore. Reported she was advised by her PCP to have this looked at by vascular surgeon. Pt states the left BKA hurts during HD since she fell on the left BKA, and she is not able to complete HD due to this. She is taking Tylenol alternating with tramadol for pain which is intermittent. The pain wakes her up in the middle of the night.  She does recall ever taking gabapentin or neurontin.  She has been [employer] but cannot proceed due to bleeding she has been having at the stump  She was evaluated in [facility] ED by Dr. Kellie Simmering on 07/27/15 for bleeding from right thigh graft. Her coumadin dose was decreased.    Dr. [name] spoke with and examined pt. Protect left BKA from further injury until it heals. Pt states that pain in the left BKA is worse during  HD since she fell on the left BKA and is unable to finish her HD session. Hold off on prosthesis fitting until wound has fully granulated, no bleeding. Will prescribe low dose gabapentin to help with pain, and to help pt to tolerate HD.  PLAN:  Gabapentin 100 mg tid prn left BKA stump pain, disp #60, 0 refills. Based on the patient's vascular studies and examination, pt will return to clinic prn.  I discussed in depth with the patient the nature of atherosclerosis, and emphasized the importance of maximal medical management including strict control of blood pressure, blood glucose, and lipid levels, obtaining regular exercise, and continued cessation of smoking.  The patient is aware that without maximal medical management the underlying atherosclerotic disease process will  progress, limiting the benefit of any interventions.  The patient was given information about PAD including signs, symptoms, treatment, what symptoms should prompt the patient to seek immediate medical care, and risk reduction measures to take.  Clemon Chambers, RN, MSN, FNP-C [facility] Phone: [phone]  Clinic MD: [name]  10/19/2015 4:23 PM

## 2015-10-20 LAB — PROTIME-INR

## 2015-10-21 DIAGNOSIS — I4891 Unspecified atrial fibrillation: Secondary | ICD-10-CM | POA: Diagnosis not present

## 2015-10-21 DIAGNOSIS — N186 End stage renal disease: Secondary | ICD-10-CM | POA: Diagnosis not present

## 2015-10-21 DIAGNOSIS — I120 Hypertensive chronic kidney disease with stage 5 chronic kidney disease or end stage renal disease: Secondary | ICD-10-CM | POA: Diagnosis not present

## 2015-10-21 DIAGNOSIS — E1129 Type 2 diabetes mellitus with other diabetic kidney complication: Secondary | ICD-10-CM | POA: Diagnosis not present

## 2015-10-21 DIAGNOSIS — Z7901 Long term (current) use of anticoagulants: Secondary | ICD-10-CM | POA: Diagnosis not present

## 2015-10-21 DIAGNOSIS — E1151 Type 2 diabetes mellitus with diabetic peripheral angiopathy without gangrene: Secondary | ICD-10-CM | POA: Diagnosis not present

## 2015-10-21 DIAGNOSIS — Z4781 Encounter for orthopedic aftercare following surgical amputation: Secondary | ICD-10-CM | POA: Diagnosis not present

## 2015-10-21 DIAGNOSIS — Z992 Dependence on renal dialysis: Secondary | ICD-10-CM | POA: Diagnosis not present

## 2015-10-21 LAB — POCT INR: INR: 2

## 2015-10-24 ENCOUNTER — Ambulatory Visit (INDEPENDENT_AMBULATORY_CARE_PROVIDER_SITE_OTHER): Payer: Medicare Other | Admitting: Pharmacist

## 2015-10-24 DIAGNOSIS — Z8673 Personal history of transient ischemic attack (TIA), and cerebral infarction without residual deficits: Secondary | ICD-10-CM

## 2015-10-24 DIAGNOSIS — D509 Iron deficiency anemia, unspecified: Secondary | ICD-10-CM | POA: Diagnosis not present

## 2015-10-24 DIAGNOSIS — S91301D Unspecified open wound, right foot, subsequent encounter: Secondary | ICD-10-CM | POA: Diagnosis not present

## 2015-10-24 DIAGNOSIS — I4891 Unspecified atrial fibrillation: Secondary | ICD-10-CM

## 2015-10-24 DIAGNOSIS — A4902 Methicillin resistant Staphylococcus aureus infection, unspecified site: Secondary | ICD-10-CM | POA: Diagnosis not present

## 2015-10-24 DIAGNOSIS — E1129 Type 2 diabetes mellitus with other diabetic kidney complication: Secondary | ICD-10-CM | POA: Diagnosis not present

## 2015-10-24 DIAGNOSIS — N186 End stage renal disease: Secondary | ICD-10-CM | POA: Diagnosis not present

## 2015-10-24 DIAGNOSIS — N2581 Secondary hyperparathyroidism of renal origin: Secondary | ICD-10-CM | POA: Diagnosis not present

## 2015-10-24 DIAGNOSIS — D631 Anemia in chronic kidney disease: Secondary | ICD-10-CM | POA: Diagnosis not present

## 2015-10-26 ENCOUNTER — Encounter (HOSPITAL_COMMUNITY): Payer: Self-pay

## 2015-10-26 ENCOUNTER — Emergency Department (HOSPITAL_COMMUNITY)
Admission: EM | Admit: 2015-10-26 | Discharge: 2015-10-26 | Disposition: A | Discharge status: Home / Self Care | Payer: Medicare Other | Attending: Emergency Medicine | Admitting: Emergency Medicine

## 2015-10-26 DIAGNOSIS — W19XXXD Unspecified fall, subsequent encounter: Secondary | ICD-10-CM | POA: Diagnosis not present

## 2015-10-26 DIAGNOSIS — E1122 Type 2 diabetes mellitus with diabetic chronic kidney disease: Secondary | ICD-10-CM | POA: Insufficient documentation

## 2015-10-26 DIAGNOSIS — Z992 Dependence on renal dialysis: Secondary | ICD-10-CM | POA: Insufficient documentation

## 2015-10-26 DIAGNOSIS — S81002D Unspecified open wound, left knee, subsequent encounter: Secondary | ICD-10-CM | POA: Insufficient documentation

## 2015-10-26 DIAGNOSIS — Z79899 Other long term (current) drug therapy: Secondary | ICD-10-CM | POA: Insufficient documentation

## 2015-10-26 DIAGNOSIS — Z8673 Personal history of transient ischemic attack (TIA), and cerebral infarction without residual deficits: Secondary | ICD-10-CM | POA: Insufficient documentation

## 2015-10-26 DIAGNOSIS — S81802A Unspecified open wound, left lower leg, initial encounter: Secondary | ICD-10-CM | POA: Diagnosis not present

## 2015-10-26 DIAGNOSIS — I120 Hypertensive chronic kidney disease with stage 5 chronic kidney disease or end stage renal disease: Secondary | ICD-10-CM | POA: Insufficient documentation

## 2015-10-26 DIAGNOSIS — N186 End stage renal disease: Secondary | ICD-10-CM | POA: Diagnosis not present

## 2015-10-26 DIAGNOSIS — Z7901 Long term (current) use of anticoagulants: Secondary | ICD-10-CM | POA: Diagnosis not present

## 2015-10-26 DIAGNOSIS — S81802D Unspecified open wound, left lower leg, subsequent encounter: Secondary | ICD-10-CM

## 2015-10-26 DIAGNOSIS — T148 Other injury of unspecified body region: Secondary | ICD-10-CM | POA: Diagnosis not present

## 2015-10-26 MED ORDER — SILVER SULFADIAZINE 1 % EX CREA
TOPICAL_CREAM | Freq: Once | CUTANEOUS | Status: AC
  Administered 2015-10-26: 08:00:00 via TOPICAL
  Filled 2015-10-26: qty 85

## 2015-10-26 MED ORDER — SILVER SULFADIAZINE 1 % EX CREA: 1.0000 "application " | TOPICAL_CREAM | Freq: Every day | CUTANEOUS | Status: DC

## 2015-10-26 NOTE — ED Notes (Signed)
Per EMS pt had left [facility] in March; pt fell approx. 3 weeks ago and wound was split open; Pt states she has seen PCP but he sent her home; pt states wound has been ozing blood more over the past few days; No bleeding noted to site on arrival; pt is due for diaylsis on today; Pt a&ox 4 on arrival

## 2015-10-26 NOTE — ED Provider Notes (Signed)
CSN: [id number]     Arrival date & time 10/26/15  0621 History   First MD Initiated Contact with Patient 10/26/15 [phone]     Chief Complaint  Patient presents with  . Wound Dehiscence     (Consider location/radiation/quality/duration/timing/severity/associated sxs/prior Treatment) HPI Comments: Patient with a history of DM, HTN, ESRD on dialysis, and left BKA presents today with a chief complaint of wound dehiscence.  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  She fell three weeks ago unto her knee and the surgical scar has been open and bleeding intermittently since that time.  She states that she has seen her PCP, Urgent Care, and Vascular surgery in the past couple of weeks for this and states that she was told that they would monitor it.   She denies any purulent drainage from the area.   She has not noticed any edema or erythema of the area, but states that it is difficult for her to see the wound.  Denies fever or chills.   No numbness or tingling.    The history is provided by the patient.    Past Medical History  Diagnosis Date  . Hypertension   . CVA (cerebral infarction)     right parietal 05/19/2000  . Vaginal bleeding   . Hyperparathyroidism   . Pneumonia   . Stroke ([place])     2002,no residual  . Peripheral vascular disease ([place])   . GERD (gastroesophageal reflux disease)   . DM (diabetes mellitus) type II controlled peripheral vascular disorder ([place])   . Slip/trip w/o falling due to stepping on object, subs July  2014  . ESRD (end stage renal disease) ([place])     dialysis M/W/F  . Constipation   . Complication of anesthesia     Morphine makes her nauseated, states she has small veins  . Atrial fibrillation ([place])   . Anemia    Past Surgical History  Procedure Laterality Date  . Parathyroidectomy with autotransplant  12/07/2010  . Knee surgery Right X 2  . Brachial artery graft      x2 for dialysis  . Av fistula placement Right     upper thigh  . Dental  extractions Bilateral   . Dilation and curettage of uterus  1986  . Transmetatarsal amputation Left 07/15/2012    Procedure: TRANSMETATARSAL AMPUTATION;  Surgeon: Angelia Mould, MD;  Location: [facility] OR;  Service: Vascular;  Laterality: Left;  . Leg surgery Right   . Foot surgery Left     5 TOES AMPUTATED  . Abdominal aortagram N/A 05/27/2012    Procedure: ABDOMINAL Maxcine Ham;  Surgeon: Serafina Mitchell, MD;  Location: [facility] CATH LAB;  Service: Cardiovascular;  Laterality: N/A;  . Lower extremity angiogram Left 05/27/2012    Procedure: LOWER EXTREMITY ANGIOGRAM;  Surgeon: Serafina Mitchell, MD;  Location: [facility] CATH LAB;  Service: Cardiovascular;  Laterality: Left;  lt leg angio  . Tooth extraction  Sep 08, 2014  . Peripheral vascular catheterization N/A 01/11/2015    Procedure: Lower Extremity Angiography;  Surgeon: Serafina Mitchell, MD;  Location: [place] CV LAB;  Service: Cardiovascular;  Laterality: N/A;  . I&d extremity Left 05/05/2015    Procedure: DEBRIDEMENT HEEL WOUND;  Surgeon: Angelia Mould, MD;  Location: [place];  Service: Vascular;  Laterality: Left;  . Application of wound vac Left 05/05/2015    Procedure: APPLICATION OF WOUND VAC;  Surgeon: Angelia Mould, MD;  Location: [place];  Service:  Vascular;  Laterality: Left;  . Amputation Left 06/28/2015    Procedure:  Left BELOW KNEE amputation;  Surgeon: Angelia Mould, MD;  Location: [facility] OR;  Service: Vascular;  Laterality: Left;   Family History  Problem Relation Age of Onset  . Hypertension Father   . Kidney disease Mother    Social History  Substance Use Topics  . Smoking status: Never Smoker   . Smokeless tobacco: Never Used  . Alcohol Use: No   OB History    No data available     Review of Systems  All other systems reviewed and are negative.     Allergies  Doxycycline and Peroxyl  Home Medications   Prior to Admission medications   Medication Sig Start Date End Date Taking? Authorizing Provider   acetaminophen (TYLENOL) 325 MG tablet Take 650 mg by mouth every Monday, Wednesday, and Friday. With dialysis   Yes Historical Provider, MD  B Complex-C-Folic Acid (RENA-VITE RX) 1 MG TABS Take 1 mg by mouth daily. 05/26/15  Yes Historical Provider, MD  CALCIUM-VITAMIN D PO Take 1 tablet by mouth 3 (three) times daily with meals.    Yes Historical Provider, MD  Cyanocobalamin (VITAMIN B-12 PO) Take 1 tablet by mouth 4 (four) times a week. Non dialysis days. Cain Saupe, Sat and Sunday   Yes Historical Provider, MD  diazepam (VALIUM) 5 MG tablet Take 5 mg by mouth every 8 (eight) hours as needed for muscle spasms.  11/15/14  Yes Historical Provider, MD  diltiazem (CARDIZEM CD) 120 MG 24 hr capsule Take 1 capsule (120 mg total) by mouth daily. 03/27/15  Yes Shanker Kristeen Mans, MD  glipiZIDE (GLUCOTROL) 10 MG tablet Take 0.5 tablets (5 mg total) by mouth daily. 11/23/14  Yes Lance Bosch, NP  ibuprofen (ADVIL,MOTRIN) 800 MG tablet Take 800 mg by mouth every 8 (eight) hours as needed for mild pain.    Yes Historical Provider, MD  traMADol (ULTRAM) 50 MG tablet Take 1-2 mg by mouth every 6 (six) hours as needed for moderate pain. Reported on 10/19/2015   Yes Historical Provider, MD  vitamin C (ASCORBIC ACID) 500 MG tablet Take 500 mg by mouth daily.   Yes Historical Provider, MD  vitamin E 400 UNIT capsule Take 400 Units by mouth daily.   Yes Historical Provider, MD  warfarin (COUMADIN) 6 MG tablet Take 1 tablet by mouth daily or as directed by coumadin clinic Patient taking differently: Take 6 mg by mouth daily at 6 PM.  09/28/15  Yes Leonie Man, MD  gabapentin (NEURONTIN) 100 MG capsule Take 1 capsule (100 mg total) by mouth 3 (three) times daily. 10/19/15   Sharmon Leyden Nickel, NP  oxyCODONE (OXY IR/ROXICODONE) 5 MG immediate release tablet Take 1-2 tablets (5-10 mg total) by mouth every 4 (four) hours as needed for moderate pain. Patient not taking: Reported on 10/26/2015 07/02/15   Ulyses Amor, PA-C   polyethylene glycol [facility] / Floria Raveling) packet Take 17 g by mouth daily. Patient not taking: Reported on 10/26/2015 08/19/14   Reyne Dumas, MD   BP 139/66 mmHg  Pulse 90  Temp(Src) 98.4 F (36.9 C)  Resp 16  Ht 5\' 2"  (1.575 m)  Wt 71.668 kg  BMI 28.89 kg/m2  SpO2 99%  LMP  (LMP Unknown) Physical Exam  Constitutional: She appears well-developed and well-nourished.  HENT:  Head: Normocephalic and atraumatic.  Mouth/Throat: Oropharynx is clear and moist.  Neck: Normal range of motion. Neck supple.  Cardiovascular:  Normal rate, regular rhythm and normal heart sounds.   Pulmonary/Chest: Effort normal and breath sounds normal.  Musculoskeletal:  Left BKA.  Surgical scar is slightly open with very small amount of active bleeding.  No surrounding erythema, edema, or warmth.    Neurological: She is alert.  Skin: Skin is warm and dry.  Psychiatric: She has a normal mood and affect.  Nursing note and vitals reviewed.   ED Course  Procedures (including critical care time) Labs Review Labs Reviewed - No data to display  Imaging Review No results found. I have personally reviewed and evaluated these images and lab results as part of my medical decision-making.   EKG Interpretation None      MDM   Final diagnoses:  None   Patient with a history of left BKA in March 2017 presents today complaining that her surgical scar has opened and bleeding since falling three weeks ago.  She has seen PCP, Vascular Surgery, and Urgent Care for this.  Bleeding is very minimal at this time.  No signs of infection.  Dressing applied in the ED.  Consulted case management who set the patient up with [place] for wound management and dressing changes.  No active signs of infection.  However, patient instructed to apply silvadene to prevent infection.  Stable to discharge.  Return precautions given.  Patient also evaluated by Dr Betsey Holiday who is in agreement with the plan.      Hyman Bible,  PA-C 10/27/15 2122  Orpah Greek, MD 10/28/15 [phone]

## 2015-10-26 NOTE — Discharge Planning (Signed)
[facility] consulted regarding adding wound care to pt home health services.  EDCM contacted Christa See, RN of [facility]) to confirm pt active and is able to upgrade care.    Pt states walker is no longer feasible for mobility.  EDCM contacted [place] equipment to find that pt will not be eligible for wheelchair as she received walker under insurance benefits.  Pt will have to pay out of pocket for wheelchair. Information relayed to pt.

## 2015-10-27 ENCOUNTER — Ambulatory Visit (INDEPENDENT_AMBULATORY_CARE_PROVIDER_SITE_OTHER): Payer: Medicare Other | Admitting: Pharmacist

## 2015-10-27 DIAGNOSIS — Z4781 Encounter for orthopedic aftercare following surgical amputation: Secondary | ICD-10-CM | POA: Diagnosis not present

## 2015-10-27 DIAGNOSIS — Z8673 Personal history of transient ischemic attack (TIA), and cerebral infarction without residual deficits: Secondary | ICD-10-CM

## 2015-10-27 DIAGNOSIS — Z7901 Long term (current) use of anticoagulants: Secondary | ICD-10-CM | POA: Diagnosis not present

## 2015-10-27 DIAGNOSIS — N186 End stage renal disease: Secondary | ICD-10-CM | POA: Diagnosis not present

## 2015-10-27 DIAGNOSIS — I4891 Unspecified atrial fibrillation: Secondary | ICD-10-CM

## 2015-10-27 DIAGNOSIS — E1151 Type 2 diabetes mellitus with diabetic peripheral angiopathy without gangrene: Secondary | ICD-10-CM | POA: Diagnosis not present

## 2015-10-27 DIAGNOSIS — I482 Chronic atrial fibrillation: Secondary | ICD-10-CM | POA: Diagnosis not present

## 2015-10-27 DIAGNOSIS — I120 Hypertensive chronic kidney disease with stage 5 chronic kidney disease or end stage renal disease: Secondary | ICD-10-CM | POA: Diagnosis not present

## 2015-10-27 LAB — POCT INR: INR: 1.5

## 2015-10-27 LAB — PROTIME-INR

## 2015-10-28 ENCOUNTER — Telehealth: Payer: Self-pay

## 2015-10-28 NOTE — Telephone Encounter (Signed)
Phone call from nurse at [facility].  Reported that pt's stump site is oozing small amt of bloody drainage.  Also noted that there is an area that is "split" on the stump incision.  Noted the pt. Was in the ED on 7/5, and it was noted that the pt. had fallen on the stump, approx. 3 weeks prior; the ED note of 7/5 also indicated a small opening in the stump incision and small amt. of active bleeding.  Per the ED Case Manager, Adv. HH was contacted to initiate wound care.  Advised nurse from [place]. that appt. Will be set up for incision check with NP on Tues, 11/01/15, since pt. Dialyzes on M-W-F AM.  Nurse, Angie will give appt. Inform. To the pt.

## 2015-10-31 ENCOUNTER — Encounter: Payer: Self-pay | Admitting: Family

## 2015-10-31 DIAGNOSIS — S91301D Unspecified open wound, right foot, subsequent encounter: Secondary | ICD-10-CM | POA: Diagnosis not present

## 2015-10-31 DIAGNOSIS — N186 End stage renal disease: Secondary | ICD-10-CM | POA: Diagnosis not present

## 2015-10-31 DIAGNOSIS — Z7901 Long term (current) use of anticoagulants: Secondary | ICD-10-CM | POA: Diagnosis not present

## 2015-10-31 DIAGNOSIS — E1151 Type 2 diabetes mellitus with diabetic peripheral angiopathy without gangrene: Secondary | ICD-10-CM | POA: Diagnosis not present

## 2015-10-31 DIAGNOSIS — I4891 Unspecified atrial fibrillation: Secondary | ICD-10-CM | POA: Diagnosis not present

## 2015-10-31 DIAGNOSIS — I120 Hypertensive chronic kidney disease with stage 5 chronic kidney disease or end stage renal disease: Secondary | ICD-10-CM | POA: Diagnosis not present

## 2015-10-31 DIAGNOSIS — Z4781 Encounter for orthopedic aftercare following surgical amputation: Secondary | ICD-10-CM | POA: Diagnosis not present

## 2015-11-01 ENCOUNTER — Encounter: Payer: Self-pay | Admitting: Family

## 2015-11-01 ENCOUNTER — Ambulatory Visit (INDEPENDENT_AMBULATORY_CARE_PROVIDER_SITE_OTHER): Payer: Medicare Other | Admitting: Family

## 2015-11-01 VITALS — BP 186/92 | HR 109 | Temp 98.3°F | Resp 16 | Ht 62.0 in | Wt 154.3 lb

## 2015-11-01 DIAGNOSIS — I739 Peripheral vascular disease, unspecified: Secondary | ICD-10-CM

## 2015-11-01 DIAGNOSIS — Z89522 Acquired absence of left knee: Secondary | ICD-10-CM

## 2015-11-01 DIAGNOSIS — Z992 Dependence on renal dialysis: Secondary | ICD-10-CM

## 2015-11-01 DIAGNOSIS — T8744 Infection of amputation stump, left lower extremity: Secondary | ICD-10-CM | POA: Diagnosis not present

## 2015-11-01 DIAGNOSIS — I4891 Unspecified atrial fibrillation: Secondary | ICD-10-CM | POA: Diagnosis not present

## 2015-11-01 DIAGNOSIS — Z7901 Long term (current) use of anticoagulants: Secondary | ICD-10-CM | POA: Diagnosis not present

## 2015-11-01 DIAGNOSIS — I70262 Atherosclerosis of native arteries of extremities with gangrene, left leg: Secondary | ICD-10-CM | POA: Diagnosis not present

## 2015-11-01 DIAGNOSIS — Z4781 Encounter for orthopedic aftercare following surgical amputation: Secondary | ICD-10-CM | POA: Diagnosis not present

## 2015-11-01 DIAGNOSIS — N186 End stage renal disease: Secondary | ICD-10-CM | POA: Diagnosis not present

## 2015-11-01 DIAGNOSIS — I120 Hypertensive chronic kidney disease with stage 5 chronic kidney disease or end stage renal disease: Secondary | ICD-10-CM | POA: Diagnosis not present

## 2015-11-01 DIAGNOSIS — E1151 Type 2 diabetes mellitus with diabetic peripheral angiopathy without gangrene: Secondary | ICD-10-CM | POA: Diagnosis not present

## 2015-11-01 DIAGNOSIS — Z89512 Acquired absence of left leg below knee: Secondary | ICD-10-CM

## 2015-11-01 MED ORDER — CEPHALEXIN 500 MG PO CAPS: 500.0000 mg | ORAL_CAPSULE | Freq: Two times a day (BID) | ORAL | Status: DC

## 2015-11-01 NOTE — Progress Notes (Signed)
[facility] [place]   CC: Follow up peripheral artery occlusive disease  History of Present Illness [name] is a 55 y.o. female patient of Dr. 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 She is also on HD MWF via right thigh graft. She takes coumadin for a-fib. Pt returns today after phone call from nurse at [facility] on 10/28/15. Reported that pt's stump site is oozing small amt of bloody drainage. Also noted that there is an area that is "split" on the stump incision. Noted the pt. Was in the ED on 7/5, and it was noted that the pt. had fallen on the stump, approx. 3 weeks prior; the ED note of 7/5 also indicated a small opening in the stump incision and small amt. of active bleeding. Per the ED Case Manager, Adv. HH was contacted to initiate wound care.  Pt states that the HD center obtained a culture of the wound at her left BKA stump yesterday. She states she is not taking any antibiotics now by mouth. She states she did not take the gabapentin prescribed on 10/19/15 visit.  She fell about the second week in June 2017, hitting her left BKA stump. I evaluated pt on 10/19/15 re this. Pt states the left BKA hurts during HD since she fell on the left BKA, and she is not able to complete HD due to this. She was taking Tylenol alternating with tramadol for pain which is intermittent. The pain wakes her up in the middle of the night.   She has seen [facility] but cannot proceed due to bleeding she has been having at the stump  She was evaluated in [facility] ED by Dr. Kellie Simmering on 07/27/15 for bleeding from right thigh graft. Her coumadin dose was decreased.   Pt Diabetic: Yes Pt smoker: non-smoker  Pt meds include: Statin :No Betablocker: No ASA: No Other anticoagulants/antiplatelets: coumadin for atrial fib     Past Medical History  Diagnosis Date  . Hypertension   . CVA (cerebral infarction)     right parietal 05/19/2000  . Vaginal bleeding   .  Hyperparathyroidism   . Pneumonia   . Stroke ([place])     2002,no residual  . Peripheral vascular disease ([place])   . GERD (gastroesophageal reflux disease)   . DM (diabetes mellitus) type II controlled peripheral vascular disorder ([place])   . Slip/trip w/o falling due to stepping on object, subs July  2014  . ESRD (end stage renal disease) ([place])     dialysis M/W/F  . Constipation   . Complication of anesthesia     Morphine makes her nauseated, states she has small veins  . Atrial fibrillation ([place])   . Anemia     Social History Social History  Substance Use Topics  . Smoking status: Never Smoker   . Smokeless tobacco: Never Used  . Alcohol Use: No    Family History Family History  Problem Relation Age of Onset  . Hypertension Father   . Kidney disease Mother     Past Surgical History  Procedure Laterality Date  . Parathyroidectomy with autotransplant  12/07/2010  . Knee surgery Right X 2  . Brachial artery graft      x2 for dialysis  . Av fistula placement Right     upper thigh  . Dental extractions Bilateral   . Dilation and curettage of uterus  1986  . Transmetatarsal amputation Left 07/15/2012    Procedure: TRANSMETATARSAL AMPUTATION;  Surgeon: Judeth Cornfield  [name], MD;  Location: [facility] OR;  Service: Vascular;  Laterality: Left;  . Leg surgery Right   . Foot surgery Left     5 TOES AMPUTATED  . Abdominal aortagram N/A 05/27/2012    Procedure: ABDOMINAL Maxcine Ham;  Surgeon: Serafina Mitchell, MD;  Location: [facility] CATH LAB;  Service: Cardiovascular;  Laterality: N/A;  . Lower extremity angiogram Left 05/27/2012    Procedure: LOWER EXTREMITY ANGIOGRAM;  Surgeon: Serafina Mitchell, MD;  Location: [facility] CATH LAB;  Service: Cardiovascular;  Laterality: Left;  lt leg angio  . Tooth extraction  Sep 08, 2014  . Peripheral vascular catheterization N/A 01/11/2015    Procedure: Lower Extremity Angiography;  Surgeon: Serafina Mitchell, MD;  Location: [place] CV LAB;  Service: Cardiovascular;   Laterality: N/A;  . I&d extremity Left 05/05/2015    Procedure: DEBRIDEMENT HEEL WOUND;  Surgeon: Angelia Mould, MD;  Location: [place];  Service: Vascular;  Laterality: Left;  . Application of wound vac Left 05/05/2015    Procedure: APPLICATION OF WOUND VAC;  Surgeon: Angelia Mould, MD;  Location: [place];  Service: Vascular;  Laterality: Left;  . Amputation Left 06/28/2015    Procedure:  Left BELOW KNEE amputation;  Surgeon: Angelia Mould, MD;  Location: [place];  Service: Vascular;  Laterality: Left;    Allergies  Allergen Reactions  . Doxycycline Itching  . Peroxyl [Hydrogen Peroxide] Hives and Rash    Current Outpatient Prescriptions  Medication Sig Dispense Refill  . acetaminophen (TYLENOL) 325 MG tablet Take 650 mg by mouth every Monday, Wednesday, and Friday. With dialysis    . B Complex-C-Folic Acid (RENA-VITE RX) 1 MG TABS Take 1 mg by mouth daily.  1  . CALCIUM-VITAMIN D PO Take 1 tablet by mouth 3 (three) times daily with meals.     . Cyanocobalamin (VITAMIN B-12 PO) Take 1 tablet by mouth 4 (four) times a week. Non dialysis days. Tues, Thurs, Sat and Sunday    . diazepam (VALIUM) 5 MG tablet Take 5 mg by mouth every 8 (eight) hours as needed for muscle spasms.   0  . diltiazem (CARDIZEM CD) 120 MG 24 hr capsule Take 1 capsule (120 mg total) by mouth daily. 60 capsule 0  . glipiZIDE (GLUCOTROL) 10 MG tablet Take 0.5 tablets (5 mg total) by mouth daily. 45 tablet 3  . ibuprofen (ADVIL,MOTRIN) 800 MG tablet Take 800 mg by mouth every 8 (eight) hours as needed for mild pain.     Marland Kitchen oxyCODONE (OXY IR/ROXICODONE) 5 MG immediate release tablet Take 1-2 tablets (5-10 mg total) by mouth every 4 (four) hours as needed for moderate pain. 30 tablet 0  . polyethylene glycol (MIRALAX / GLYCOLAX) packet Take 17 g by mouth daily. 14 each 0  . silver sulfADIAZINE (SILVADENE) 1 % cream Apply 1 application topically daily. 50 g 0  . traMADol (ULTRAM) 50 MG tablet Take 1-2 mg by  mouth every 6 (six) hours as needed for moderate pain. Reported on 10/19/2015    . vitamin C (ASCORBIC ACID) 500 MG tablet Take 500 mg by mouth daily.    . vitamin E 400 UNIT capsule Take 400 Units by mouth daily.    Marland Kitchen warfarin (COUMADIN) 6 MG tablet Take 1 tablet by mouth daily or as directed by coumadin clinic (Patient taking differently: Take 6 mg by mouth daily at 6 PM. ) 30 tablet 1  . gabapentin (NEURONTIN) 100 MG capsule Take 1 capsule (100 mg total) by  mouth 3 (three) times daily. (Patient not taking: Reported on 11/01/2015) 60 capsule 1   No current facility-administered medications for this visit.    ROS: See HPI for pertinent positives and negatives.   Physical Examination  Filed Vitals:   11/01/15 1136  BP: 186/92  Pulse: 109  Temp: 98.3 F (36.8 C)  TempSrc: Oral  Resp: 16  Height: 5\' 2"  (1.575 m)  Weight: 154 lb 5.2 oz (70 kg)  SpO2: 96%   Body mass index is 28.22 kg/(m^2).  General: A&O x 3, WDWN, obese female. Hirsute face. Gait: seated in wheelchair Eyes: PERRLA. Pulmonary: Respirations are non labored Cardiac: regular rhythm, no detected murmur.     Carotid Bruits Right Left   Negative Negative  Aorta is not palpable. Radial pulses: 1+ palpable   VASCULAR EXAM: Extremities:. Left BKA with open wound measuring 7 cm lateral to proximal, 1 to 1.5 cm wide, with surrounding eschar measuring 8 cm anterior to posterior and 9 cm lateral to proximal. + Foul odor. Fibrinous exudate in open wound.   Left BKA is tender to touch, minimal swelling.  Palpable thrill at right thigh graft, no bleeding.      LE Pulses Right Left   FEMORAL not palpable, pt seated in wheelchair not palpable, pt seated in wheelchair    POPLITEAL not palpable  not palpable   POSTERIOR  TIBIAL not palpable  BKA     DORSALIS PEDIS  ANTERIOR TIBIAL not palpable  BKA    Abdomen: soft, NT, no palpable masses. Skin: no rashes, see Extremities. Musculoskeletal: no muscle wasting or atrophy, see Extremities. Neurologic: A&O X 3; Appropriate Affect ; SENSATION: normal; MOTOR FUNCTION: moving all extremities equally, motor strength 5/5 throughout. Speech is fluent/normal, soft spoken.  CN 2-12 grossly intact.          ASSESSMENT: [name] is a 55 y.o. 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 She is also on HD MWF via right thigh graft. She takes coumadin for a-fib. Pt returns today after phone call from nurse at [facility] on 10/28/15. Reported that pt's stump site is oozing small amt of bloody drainage. Also noted that there is an area that is "split" on the stump incision. Noted the pt. Was in the ED on 7/5, and it was noted that the pt. had fallen on the stump, approx. 3 weeks prior; the ED note of 7/5 also indicated a small opening in the stump incision and small amt. of active bleeding. Per the ED Case Manager, Adv. HH was contacted to initiate wound care.  Pt states that the HD center obtained a culture of the wound at her left BKA stump yesterday. She states she is not taking any antibiotics now by mouth. She states she did not take the gabapentin prescribed on 10/19/15 visit. Pt states [name] has visited her recently. Today the left BKA has an open wound measuring 7 cm lateral to proximal, 1 to 1.5 cm wide, with surrounding eschar measuring 8 cm anterior to posterior and 9 cm lateral to proximal. + Foul odor. Fibrinous exudate in open wound.   Left BKA is tender to touch, minimal swelling.  Dr. Donnetta Hutching spoke with and examined pt. The wound no longer has viable tissue to heal adequately.    PLAN:  Schedule left AKA on a Tuesday or Thursday, on non HD day, pt wants time to think about this. Will advise Dr. [name]. In the interim,  will advise [facility], seeing pt  now, to perform wet to dry dressing changes left BKA stump open wound. Keflex 500 mg po bid x 2 weeks.  I discussed in depth with the patient the nature of atherosclerosis, and emphasized the importance of maximal medical management including strict control of blood pressure, blood glucose, and lipid levels, obtaining regular exercise, and continued cessation of smoking.  The patient is aware that without maximal medical management the underlying atherosclerotic disease process will progress, limiting the benefit of any interventions.  The patient was given information about PAD including signs, symptoms, treatment, what symptoms should prompt the patient to seek immediate medical care, and risk reduction measures to take.  Clemon Chambers, RN, MSN, FNP-C [facility] Phone: [phone]  Clinic MD: Early  11/01/2015 12:27 PM

## 2015-11-02 DIAGNOSIS — I482 Chronic atrial fibrillation: Secondary | ICD-10-CM | POA: Diagnosis not present

## 2015-11-03 ENCOUNTER — Telehealth: Payer: Self-pay

## 2015-11-03 ENCOUNTER — Other Ambulatory Visit: Payer: Self-pay

## 2015-11-03 NOTE — Telephone Encounter (Signed)
rec'd voice message from [place], RN [facility] nurse from [place] [facility] [place]  Stated she will be checking pt's PT/ INR today, and will coordinate communicating with pt's pharmacist re: holding the Coumadin pre-operatively. Call placed to [facility] RN, and advised that pt. has been instructed to hold Coumadin starting today (pt. had already taken today's dose, and verb. understanding to not resume until after surgery.)  Also advised [facility] RN of pt's surgery date of 11/08/15.

## 2015-11-03 NOTE — Telephone Encounter (Signed)
Pt. Called to inquire about scheduling surgery for (L) AKA.  Surgery scheduled on 11/08/15; on Coumadin; has taken dose today already.  Advised she will need to hold Coumadin after today, and not resume until instructed to do so, post surgery.   Will fax pre-op instructions to the [place] for pt.  Pt. verb. understanding.  Phone call to [facility] RN, to make aware of left BKA wound care orders, and need to hold Coumadin until after surgery; left voice message to call the office to discuss further.

## 2015-11-04 ENCOUNTER — Telehealth: Payer: Self-pay | Admitting: Pharmacist Clinician (PhC)/ Clinical Pharmacy Specialist

## 2015-11-04 DIAGNOSIS — Z4781 Encounter for orthopedic aftercare following surgical amputation: Secondary | ICD-10-CM | POA: Diagnosis not present

## 2015-11-04 DIAGNOSIS — Z7901 Long term (current) use of anticoagulants: Secondary | ICD-10-CM | POA: Diagnosis not present

## 2015-11-04 DIAGNOSIS — I120 Hypertensive chronic kidney disease with stage 5 chronic kidney disease or end stage renal disease: Secondary | ICD-10-CM | POA: Diagnosis not present

## 2015-11-04 DIAGNOSIS — I4891 Unspecified atrial fibrillation: Secondary | ICD-10-CM | POA: Diagnosis not present

## 2015-11-04 DIAGNOSIS — E1151 Type 2 diabetes mellitus with diabetic peripheral angiopathy without gangrene: Secondary | ICD-10-CM | POA: Diagnosis not present

## 2015-11-04 DIAGNOSIS — N186 End stage renal disease: Secondary | ICD-10-CM | POA: Diagnosis not present

## 2015-11-04 NOTE — Telephone Encounter (Signed)
Debbie from [place] called, Ms. Lasko was not at home for her [facility] care visit Thursday.  She is now scheduled for a revision on her amputation and will be off warfarin until the procedure on Tuesday.  From there she will go to SNF for rehab.  Jackelyn Poling will let us know when she is back on service

## 2015-11-07 ENCOUNTER — Encounter (HOSPITAL_COMMUNITY): Payer: Self-pay | Admitting: *Deleted

## 2015-11-07 MED ORDER — DEXTROSE 5 % IV SOLN
1.5000 g | INTRAVENOUS | Status: AC
  Administered 2015-11-08: 1.5 g via INTRAVENOUS
  Filled 2015-11-07: qty 1.5

## 2015-11-07 NOTE — Progress Notes (Signed)
Anesthesia Chart Review: SAME DAY WORK-UP.  Patient is a 55 year old female scheduled for left AKA (s/p left BKA 06/28/15) on 11/08/15 by Dr. [name].  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   PCP is with [facility]. Cardiologist is Dr. Ellyn Hack (seen in-patient 03/25/15 for afib with RVR with low risk stress test, normal LVEF), last visit 04/21/15 with Rosaria Ferries, PA-C.  Meds include Keflex, diazepam, diltiazem, gabapentin, glipizide, Oxy IR, tramadol, warfarin (on hold after 11/03/15 dose)  04/21/15 EKG ([facility]): NSR, right atrial enlargement, T wave abnormality, consider inferior ischemia.  03/26/15 Echo: Study Conclusions - Left ventricle: The cavity size was normal. There was moderate  concentric hypertrophy. Systolic function was vigorous. The  estimated ejection fraction was in the range of 65% to 70%. Wall  motion was normal; there were no regional wall motion  abnormalities. Doppler parameters are consistent with abnormal  left ventricular relaxation (grade 1 diastolic dysfunction).  Doppler parameters are consistent with high ventricular filling  pressure. - Mitral valve: Calcified annulus. - Left atrium: The atrium was mildly dilated.  03/26/15 Nuclear stress test: IMPRESSION: 1. No reversible ischemia or infarction. 2. Septal dyskinesia. Otherwise normal left ventricular wall motion. 3. Left ventricular ejection fraction 55% 4. Low-risk stress test findings*.  03/25/15 CXR: IMPRESSION: No acute abnormalities. Enlargement of cardiac silhouette.  She is for labs on arrival. If labs acceptable and otherwise no acute changes then I would anticipate that she  could proceed as planned.  George Hugh [facility] Short Stay Center/Anesthesiology Phone [phone] 11/07/2015 1:03 PM

## 2015-11-07 NOTE — Progress Notes (Signed)
Pt denies SOB and chest pain but is under the care of Dr. Ellyn Hack, Cardiology. Pt denies having a cardiac cath. Pt stated that she does not have a CBG machine to check her blood glucose. Pt made aware to hold Glipizide tonight and DOS. Pt made aware to stop otc vitamins, fish oil and herbal medications. Pt verbalized understanding of all pre-op instructions.

## 2015-11-08 ENCOUNTER — Inpatient Hospital Stay (HOSPITAL_COMMUNITY): Payer: Medicare Other | Admitting: Vascular Surgery

## 2015-11-08 ENCOUNTER — Encounter (HOSPITAL_COMMUNITY): Payer: Self-pay | Admitting: *Deleted

## 2015-11-08 ENCOUNTER — Inpatient Hospital Stay (HOSPITAL_COMMUNITY)
Admission: RE | Admit: 2015-11-08 | Discharge: 2015-11-12 | DRG: 474 | Disposition: A | Discharge status: Skilled Nursing Facility | Payer: Medicare Other | Source: Ambulatory Visit | Attending: Vascular Surgery | Admitting: Vascular Surgery

## 2015-11-08 ENCOUNTER — Encounter (HOSPITAL_COMMUNITY)
Admission: RE | Disposition: A | Discharge status: Skilled Nursing Facility | Payer: Self-pay | Source: Ambulatory Visit | Attending: Vascular Surgery

## 2015-11-08 DIAGNOSIS — E1142 Type 2 diabetes mellitus with diabetic polyneuropathy: Secondary | ICD-10-CM | POA: Diagnosis present

## 2015-11-08 DIAGNOSIS — Z8673 Personal history of transient ischemic attack (TIA), and cerebral infarction without residual deficits: Secondary | ICD-10-CM | POA: Diagnosis not present

## 2015-11-08 DIAGNOSIS — D599 Acquired hemolytic anemia, unspecified: Secondary | ICD-10-CM | POA: Diagnosis not present

## 2015-11-08 DIAGNOSIS — E1122 Type 2 diabetes mellitus with diabetic chronic kidney disease: Secondary | ICD-10-CM | POA: Diagnosis present

## 2015-11-08 DIAGNOSIS — E1129 Type 2 diabetes mellitus with other diabetic kidney complication: Secondary | ICD-10-CM | POA: Diagnosis not present

## 2015-11-08 DIAGNOSIS — Z89612 Acquired absence of left leg above knee: Secondary | ICD-10-CM | POA: Diagnosis not present

## 2015-11-08 DIAGNOSIS — I4891 Unspecified atrial fibrillation: Secondary | ICD-10-CM | POA: Diagnosis not present

## 2015-11-08 DIAGNOSIS — S78119A Complete traumatic amputation at level between unspecified hip and knee, initial encounter: Secondary | ICD-10-CM | POA: Diagnosis not present

## 2015-11-08 DIAGNOSIS — S31020D Laceration with foreign body of lower back and pelvis without penetration into retroperitoneum, subsequent encounter: Secondary | ICD-10-CM | POA: Diagnosis not present

## 2015-11-08 DIAGNOSIS — T8781 Dehiscence of amputation stump: Secondary | ICD-10-CM | POA: Diagnosis not present

## 2015-11-08 DIAGNOSIS — D638 Anemia in other chronic diseases classified elsewhere: Secondary | ICD-10-CM | POA: Diagnosis present

## 2015-11-08 DIAGNOSIS — N2581 Secondary hyperparathyroidism of renal origin: Secondary | ICD-10-CM | POA: Diagnosis not present

## 2015-11-08 DIAGNOSIS — N186 End stage renal disease: Secondary | ICD-10-CM | POA: Diagnosis not present

## 2015-11-08 DIAGNOSIS — K219 Gastro-esophageal reflux disease without esophagitis: Secondary | ICD-10-CM | POA: Diagnosis not present

## 2015-11-08 DIAGNOSIS — I120 Hypertensive chronic kidney disease with stage 5 chronic kidney disease or end stage renal disease: Secondary | ICD-10-CM | POA: Diagnosis present

## 2015-11-08 DIAGNOSIS — R259 Unspecified abnormal involuntary movements: Secondary | ICD-10-CM | POA: Diagnosis not present

## 2015-11-08 DIAGNOSIS — I70269 Atherosclerosis of native arteries of extremities with gangrene, unspecified extremity: Secondary | ICD-10-CM | POA: Diagnosis present

## 2015-11-08 DIAGNOSIS — S78112D Complete traumatic amputation at level between left hip and knee, subsequent encounter: Secondary | ICD-10-CM | POA: Diagnosis not present

## 2015-11-08 DIAGNOSIS — I739 Peripheral vascular disease, unspecified: Secondary | ICD-10-CM | POA: Diagnosis not present

## 2015-11-08 DIAGNOSIS — G546 Phantom limb syndrome with pain: Secondary | ICD-10-CM | POA: Diagnosis present

## 2015-11-08 DIAGNOSIS — Z7901 Long term (current) use of anticoagulants: Secondary | ICD-10-CM | POA: Diagnosis not present

## 2015-11-08 DIAGNOSIS — Z992 Dependence on renal dialysis: Secondary | ICD-10-CM

## 2015-11-08 DIAGNOSIS — E088 Diabetes mellitus due to underlying condition with unspecified complications: Secondary | ICD-10-CM | POA: Diagnosis not present

## 2015-11-08 DIAGNOSIS — Y835 Amputation of limb(s) as the cause of abnormal reaction of the patient, or of later complication, without mention of misadventure at the time of the procedure: Secondary | ICD-10-CM | POA: Diagnosis present

## 2015-11-08 DIAGNOSIS — N939 Abnormal uterine and vaginal bleeding, unspecified: Secondary | ICD-10-CM | POA: Diagnosis not present

## 2015-11-08 DIAGNOSIS — A4902 Methicillin resistant Staphylococcus aureus infection, unspecified site: Secondary | ICD-10-CM | POA: Diagnosis not present

## 2015-11-08 DIAGNOSIS — I480 Paroxysmal atrial fibrillation: Secondary | ICD-10-CM | POA: Diagnosis present

## 2015-11-08 DIAGNOSIS — I69898 Other sequelae of other cerebrovascular disease: Secondary | ICD-10-CM | POA: Diagnosis not present

## 2015-11-08 DIAGNOSIS — F4322 Adjustment disorder with anxiety: Secondary | ICD-10-CM | POA: Diagnosis not present

## 2015-11-08 DIAGNOSIS — I10 Essential (primary) hypertension: Secondary | ICD-10-CM | POA: Diagnosis not present

## 2015-11-08 DIAGNOSIS — T798XXD Other early complications of trauma, subsequent encounter: Secondary | ICD-10-CM | POA: Diagnosis not present

## 2015-11-08 DIAGNOSIS — D631 Anemia in chronic kidney disease: Secondary | ICD-10-CM | POA: Diagnosis not present

## 2015-11-08 DIAGNOSIS — E1152 Type 2 diabetes mellitus with diabetic peripheral angiopathy with gangrene: Secondary | ICD-10-CM | POA: Diagnosis present

## 2015-11-08 DIAGNOSIS — Z7984 Long term (current) use of oral hypoglycemic drugs: Secondary | ICD-10-CM | POA: Diagnosis not present

## 2015-11-08 DIAGNOSIS — K5909 Other constipation: Secondary | ICD-10-CM | POA: Diagnosis not present

## 2015-11-08 DIAGNOSIS — E213 Hyperparathyroidism, unspecified: Secondary | ICD-10-CM | POA: Diagnosis not present

## 2015-11-08 DIAGNOSIS — T8744 Infection of amputation stump, left lower extremity: Secondary | ICD-10-CM | POA: Diagnosis not present

## 2015-11-08 DIAGNOSIS — Z79899 Other long term (current) drug therapy: Secondary | ICD-10-CM

## 2015-11-08 HISTORY — DX: Other injury of unspecified body region, initial encounter: T14.8XXA

## 2015-11-08 HISTORY — PX: AMPUTATION: SHX166

## 2015-11-08 HISTORY — DX: Local infection of the skin and subcutaneous tissue, unspecified: L08.9

## 2015-11-08 LAB — CBC
HCT: 36.4 % (ref 36.0–46.0)
Hemoglobin: 11.1 g/dL — ABNORMAL LOW (ref 12.0–15.0)
MCH: 27.6 pg (ref 26.0–34.0)
MCHC: 30.5 g/dL (ref 30.0–36.0)
MCV: 90.5 fL (ref 78.0–100.0)
Platelets: 281 10*3/uL (ref 150–400)
RBC: 4.02 MIL/uL (ref 3.87–5.11)
RDW: 17.7 % — ABNORMAL HIGH (ref 11.5–15.5)
WBC: 8.4 10*3/uL (ref 4.0–10.5)

## 2015-11-08 LAB — APTT: aPTT: 70 seconds — ABNORMAL HIGH (ref 24–37)

## 2015-11-08 LAB — COMPREHENSIVE METABOLIC PANEL
ALT: 16 U/L (ref 14–54)
AST: 20 U/L (ref 15–41)
Albumin: 2.8 g/dL — ABNORMAL LOW (ref 3.5–5.0)
Alkaline Phosphatase: 54 U/L (ref 38–126)
Anion gap: 14 (ref 5–15)
BUN: 16 mg/dL (ref 6–20)
CO2: 29 mmol/L (ref 22–32)
Calcium: 8.9 mg/dL (ref 8.9–10.3)
Chloride: 95 mmol/L — ABNORMAL LOW (ref 101–111)
Creatinine, Ser: 7.5 mg/dL — ABNORMAL HIGH (ref 0.44–1.00)
GFR calc Af Amer: 6 mL/min — ABNORMAL LOW (ref >60)
GFR calc non Af Amer: 6 mL/min — ABNORMAL LOW (ref >60)
Glucose, Bld: 142 mg/dL — ABNORMAL HIGH (ref 65–99)
Potassium: 4.1 mmol/L (ref 3.5–5.1)
Sodium: 138 mmol/L (ref 135–145)
Total Bilirubin: 0.5 mg/dL (ref 0.3–1.2)
Total Protein: 8.2 g/dL — ABNORMAL HIGH (ref 6.5–8.1)

## 2015-11-08 LAB — GLUCOSE, CAPILLARY
GLUCOSE-CAPILLARY: 139 mg/dL — AB (ref 65–99)
GLUCOSE-CAPILLARY: 151 mg/dL — AB (ref 65–99)
GLUCOSE-CAPILLARY: 164 mg/dL — AB (ref 65–99)
Glucose-Capillary: 154 mg/dL — ABNORMAL HIGH (ref 65–99)

## 2015-11-08 LAB — PROTIME-INR
INR: 1.7 — ABNORMAL HIGH (ref 0.00–1.49)
Prothrombin Time: 20 seconds — ABNORMAL HIGH (ref 11.6–15.2)

## 2015-11-08 SURGERY — AMPUTATION, ABOVE KNEE
Anesthesia: General | Site: Leg Upper | Laterality: Left

## 2015-11-08 MED ORDER — BACITRACIN ZINC 500 UNIT/GM EX OINT
TOPICAL_OINTMENT | CUTANEOUS | Status: DC | PRN
  Administered 2015-11-08: 1 via TOPICAL

## 2015-11-08 MED ORDER — PHENYLEPHRINE HCL 10 MG/ML IJ SOLN
INTRAMUSCULAR | Status: DC | PRN
  Administered 2015-11-08 (×2): 80 ug via INTRAVENOUS
  Administered 2015-11-08: 160 ug via INTRAVENOUS

## 2015-11-08 MED ORDER — BACITRACIN ZINC 500 UNIT/GM EX OINT
TOPICAL_OINTMENT | CUTANEOUS | Status: AC
  Filled 2015-11-08: qty 28.35

## 2015-11-08 MED ORDER — VITAMIN B-12 100 MCG PO TABS
50.0000 ug | ORAL_TABLET | ORAL | Status: DC
  Administered 2015-11-08 – 2015-11-12 (×3): 50 ug via ORAL
  Filled 2015-11-08 (×3): qty 1

## 2015-11-08 MED ORDER — OXYCODONE HCL 5 MG PO TABS: 5.0000 mg | ORAL_TABLET | ORAL | Status: DC | PRN

## 2015-11-08 MED ORDER — IBUPROFEN 400 MG PO TABS: 800.0000 mg | ORAL_TABLET | Freq: Three times a day (TID) | ORAL | Status: DC | PRN

## 2015-11-08 MED ORDER — RENA-VITE RX 1 MG PO TABS: 1.0000 mg | ORAL_TABLET | Freq: Every day | ORAL | Status: DC

## 2015-11-08 MED ORDER — ONDANSETRON HCL 4 MG/2ML IJ SOLN: 4.0000 mg | Freq: Four times a day (QID) | INTRAMUSCULAR | Status: DC | PRN

## 2015-11-08 MED ORDER — ACETAMINOPHEN 325 MG PO TABS
650.0000 mg | ORAL_TABLET | ORAL | Status: DC
  Administered 2015-11-09 – 2015-11-11 (×2): 650 mg via ORAL
  Filled 2015-11-08 (×2): qty 2

## 2015-11-08 MED ORDER — HYDROMORPHONE HCL 1 MG/ML IJ SOLN
0.5000 mg | INTRAMUSCULAR | Status: DC | PRN
  Administered 2015-11-08 – 2015-11-11 (×3): 1 mg via INTRAVENOUS
  Filled 2015-11-08 (×2): qty 1

## 2015-11-08 MED ORDER — CALCIUM-VITAMIN D 500 MG PO WAFR: WAFER | Freq: Three times a day (TID) | ORAL | Status: DC

## 2015-11-08 MED ORDER — MUPIROCIN 2 % EX OINT: 1.0000 "application " | TOPICAL_OINTMENT | Freq: Once | CUTANEOUS | Status: DC

## 2015-11-08 MED ORDER — FENTANYL CITRATE (PF) 100 MCG/2ML IJ SOLN
INTRAMUSCULAR | Status: AC
  Filled 2015-11-08: qty 2

## 2015-11-08 MED ORDER — ENOXAPARIN SODIUM 30 MG/0.3ML ~~LOC~~ SOLN
30.0000 mg | SUBCUTANEOUS | Status: DC
  Filled 2015-11-08 (×2): qty 0.3

## 2015-11-08 MED ORDER — WARFARIN - PHYSICIAN DOSING INPATIENT: Freq: Every day | Status: DC

## 2015-11-08 MED ORDER — POLYETHYLENE GLYCOL 3350 17 G PO PACK
17.0000 g | PACK | Freq: Every day | ORAL | Status: DC
  Administered 2015-11-09 – 2015-11-12 (×4): 17 g via ORAL
  Filled 2015-11-08 (×4): qty 1

## 2015-11-08 MED ORDER — LABETALOL HCL 5 MG/ML IV SOLN
10.0000 mg | Freq: Once | INTRAVENOUS | Status: AC
  Administered 2015-11-08: 10 mg via INTRAVENOUS

## 2015-11-08 MED ORDER — TRAMADOL HCL 50 MG PO TABS
25.0000 mg | ORAL_TABLET | Freq: Four times a day (QID) | ORAL | Status: DC | PRN
  Administered 2015-11-09: 50 mg via ORAL

## 2015-11-08 MED ORDER — LABETALOL HCL 5 MG/ML IV SOLN
INTRAVENOUS | Status: AC
  Filled 2015-11-08: qty 4

## 2015-11-08 MED ORDER — RENA-VITE PO TABS
1.0000 | ORAL_TABLET | Freq: Every day | ORAL | Status: DC
  Administered 2015-11-08 – 2015-11-11 (×4): 1 via ORAL
  Filled 2015-11-08 (×4): qty 1

## 2015-11-08 MED ORDER — DIPHENHYDRAMINE HCL 50 MG/ML IJ SOLN
12.5000 mg | Freq: Once | INTRAMUSCULAR | Status: AC
  Administered 2015-11-08: 12.5 mg via INTRAVENOUS
  Filled 2015-11-08: qty 0.25

## 2015-11-08 MED ORDER — 0.9 % SODIUM CHLORIDE (POUR BTL) OPTIME
TOPICAL | Status: DC | PRN
  Administered 2015-11-08: 1000 mL

## 2015-11-08 MED ORDER — PANTOPRAZOLE SODIUM 40 MG PO TBEC
40.0000 mg | DELAYED_RELEASE_TABLET | Freq: Every day | ORAL | Status: DC
  Administered 2015-11-08 – 2015-11-11 (×2): 40 mg via ORAL
  Filled 2015-11-08 (×5): qty 1

## 2015-11-08 MED ORDER — DEXTROSE 5 % IV SOLN
1.5000 g | Freq: Two times a day (BID) | INTRAVENOUS | Status: DC
  Filled 2015-11-08: qty 1.5

## 2015-11-08 MED ORDER — OXYCODONE-ACETAMINOPHEN 5-325 MG PO TABS
1.0000 | ORAL_TABLET | ORAL | Status: DC | PRN
  Administered 2015-11-09 – 2015-11-11 (×4): 2 via ORAL
  Filled 2015-11-08 (×5): qty 2

## 2015-11-08 MED ORDER — FENTANYL CITRATE (PF) 250 MCG/5ML IJ SOLN
INTRAMUSCULAR | Status: AC
  Filled 2015-11-08: qty 5

## 2015-11-08 MED ORDER — ACETAMINOPHEN 650 MG RE SUPP: 325.0000 mg | RECTAL | Status: DC | PRN

## 2015-11-08 MED ORDER — GLIPIZIDE 5 MG PO TABS
5.0000 mg | ORAL_TABLET | Freq: Every day | ORAL | Status: DC
  Administered 2015-11-08: 5 mg via ORAL
  Filled 2015-11-08 (×2): qty 1

## 2015-11-08 MED ORDER — MAGNESIUM SULFATE 2 GM/50ML IV SOLN
2.0000 g | Freq: Once | INTRAVENOUS | Status: DC | PRN
  Filled 2015-11-08: qty 50

## 2015-11-08 MED ORDER — FENTANYL CITRATE (PF) 100 MCG/2ML IJ SOLN
25.0000 ug | INTRAMUSCULAR | Status: DC | PRN
  Administered 2015-11-08 (×4): 25 ug via INTRAVENOUS

## 2015-11-08 MED ORDER — PROPOFOL 10 MG/ML IV BOLUS
INTRAVENOUS | Status: AC
  Filled 2015-11-08: qty 20

## 2015-11-08 MED ORDER — METOPROLOL TARTRATE 5 MG/5ML IV SOLN: 2.0000 mg | INTRAVENOUS | Status: DC | PRN

## 2015-11-08 MED ORDER — OXYCODONE HCL 5 MG/5ML PO SOLN: 5.0000 mg | Freq: Once | ORAL | Status: AC | PRN

## 2015-11-08 MED ORDER — INSULIN ASPART 100 UNIT/ML ~~LOC~~ SOLN
0.0000 [IU] | Freq: Three times a day (TID) | SUBCUTANEOUS | Status: DC
  Administered 2015-11-08: 3 [IU] via SUBCUTANEOUS
  Administered 2015-11-09: 2 [IU] via SUBCUTANEOUS
  Administered 2015-11-09: 5 [IU] via SUBCUTANEOUS
  Administered 2015-11-10: 11 [IU] via SUBCUTANEOUS

## 2015-11-08 MED ORDER — GABAPENTIN 100 MG PO CAPS
100.0000 mg | ORAL_CAPSULE | Freq: Three times a day (TID) | ORAL | Status: DC
  Administered 2015-11-08 – 2015-11-12 (×12): 100 mg via ORAL
  Filled 2015-11-08 (×12): qty 1

## 2015-11-08 MED ORDER — WARFARIN SODIUM 6 MG PO TABS
6.0000 mg | ORAL_TABLET | Freq: Every day | ORAL | Status: DC
  Administered 2015-11-08 – 2015-11-09 (×2): 6 mg via ORAL
  Filled 2015-11-08 (×2): qty 1

## 2015-11-08 MED ORDER — LABETALOL HCL 5 MG/ML IV SOLN: 10.0000 mg | INTRAVENOUS | Status: DC | PRN

## 2015-11-08 MED ORDER — LIDOCAINE HCL (CARDIAC) 20 MG/ML IV SOLN
INTRAVENOUS | Status: DC | PRN
  Administered 2015-11-08: 60 mg via INTRAVENOUS

## 2015-11-08 MED ORDER — ACETAMINOPHEN 325 MG PO TABS: 325.0000 mg | ORAL_TABLET | ORAL | Status: DC | PRN

## 2015-11-08 MED ORDER — POTASSIUM CHLORIDE CRYS ER 20 MEQ PO TBCR: 20.0000 meq | EXTENDED_RELEASE_TABLET | Freq: Once | ORAL | Status: DC | PRN

## 2015-11-08 MED ORDER — LIDOCAINE 2% (20 MG/ML) 5 ML SYRINGE
INTRAMUSCULAR | Status: AC
  Filled 2015-11-08: qty 5

## 2015-11-08 MED ORDER — CHLORHEXIDINE GLUCONATE CLOTH 2 % EX PADS: 6.0000 | MEDICATED_PAD | Freq: Once | CUTANEOUS | Status: DC

## 2015-11-08 MED ORDER — SODIUM CHLORIDE 0.9 % IV SOLN
INTRAVENOUS | Status: DC
  Administered 2015-11-08: 25 mL/h via INTRAVENOUS

## 2015-11-08 MED ORDER — DIAZEPAM 5 MG PO TABS
5.0000 mg | ORAL_TABLET | Freq: Three times a day (TID) | ORAL | Status: DC | PRN
  Administered 2015-11-12: 5 mg via ORAL
  Filled 2015-11-08: qty 1

## 2015-11-08 MED ORDER — FENTANYL CITRATE (PF) 100 MCG/2ML IJ SOLN
INTRAMUSCULAR | Status: DC | PRN
  Administered 2015-11-08 (×3): 50 ug via INTRAVENOUS

## 2015-11-08 MED ORDER — GUAIFENESIN-DM 100-10 MG/5ML PO SYRP: 15.0000 mL | ORAL_SOLUTION | ORAL | Status: DC | PRN

## 2015-11-08 MED ORDER — OXYCODONE HCL 5 MG PO TABS
ORAL_TABLET | ORAL | Status: AC
  Filled 2015-11-08: qty 1

## 2015-11-08 MED ORDER — DILTIAZEM HCL ER COATED BEADS 120 MG PO CP24
120.0000 mg | ORAL_CAPSULE | Freq: Every day | ORAL | Status: DC
  Administered 2015-11-08 – 2015-11-12 (×5): 120 mg via ORAL
  Filled 2015-11-08 (×5): qty 1

## 2015-11-08 MED ORDER — PROPOFOL 10 MG/ML IV BOLUS
INTRAVENOUS | Status: DC | PRN
  Administered 2015-11-08: 150 mg via INTRAVENOUS

## 2015-11-08 MED ORDER — CEPHALEXIN 500 MG PO CAPS
500.0000 mg | ORAL_CAPSULE | Freq: Two times a day (BID) | ORAL | Status: DC
  Administered 2015-11-08 – 2015-11-12 (×8): 500 mg via ORAL
  Filled 2015-11-08 (×8): qty 1

## 2015-11-08 MED ORDER — ALUM & MAG HYDROXIDE-SIMETH 200-200-20 MG/5ML PO SUSP
15.0000 mL | ORAL | Status: DC | PRN
Start: 2015-11-08 — End: 2015-11-12

## 2015-11-08 MED ORDER — VITAMIN C 500 MG PO TABS
500.0000 mg | ORAL_TABLET | Freq: Every day | ORAL | Status: DC
  Administered 2015-11-09 – 2015-11-12 (×4): 500 mg via ORAL
  Filled 2015-11-08 (×4): qty 1

## 2015-11-08 MED ORDER — MIDAZOLAM HCL 2 MG/2ML IJ SOLN
INTRAMUSCULAR | Status: AC
  Filled 2015-11-08: qty 2

## 2015-11-08 MED ORDER — PHENOL 1.4 % MT LIQD: 1.0000 | OROMUCOSAL | Status: DC | PRN

## 2015-11-08 MED ORDER — OXYCODONE HCL 5 MG PO TABS
5.0000 mg | ORAL_TABLET | Freq: Once | ORAL | Status: AC | PRN
  Administered 2015-11-08: 5 mg via ORAL

## 2015-11-08 MED ORDER — DIPHENHYDRAMINE HCL 50 MG/ML IJ SOLN
INTRAMUSCULAR | Status: AC
  Administered 2015-11-08: 12.5 mg via INTRAVENOUS
  Filled 2015-11-08: qty 1

## 2015-11-08 MED ORDER — CALCIUM CARBONATE-VITAMIN D 500-200 MG-UNIT PO TABS
1.0000 | ORAL_TABLET | Freq: Three times a day (TID) | ORAL | Status: DC
  Administered 2015-11-08 – 2015-11-09 (×3): 1 via ORAL
  Filled 2015-11-08 (×3): qty 1

## 2015-11-08 MED ORDER — VITAMIN E 180 MG (400 UNIT) PO CAPS
400.0000 [IU] | ORAL_CAPSULE | Freq: Every day | ORAL | Status: DC
  Administered 2015-11-09 – 2015-11-12 (×4): 400 [IU] via ORAL
  Filled 2015-11-08 (×5): qty 1

## 2015-11-08 MED ORDER — DOCUSATE SODIUM 100 MG PO CAPS
100.0000 mg | ORAL_CAPSULE | Freq: Every day | ORAL | Status: DC
  Administered 2015-11-09 – 2015-11-12 (×4): 100 mg via ORAL
  Filled 2015-11-08 (×4): qty 1

## 2015-11-08 MED ORDER — HYDRALAZINE HCL 20 MG/ML IJ SOLN: 5.0000 mg | INTRAMUSCULAR | Status: DC | PRN

## 2015-11-08 SURGICAL SUPPLY — 52 items
BANDAGE ACE 4X5 VEL STRL LF (GAUZE/BANDAGES/DRESSINGS) ×4 IMPLANT
BANDAGE ELASTIC 6 VELCRO ST LF (GAUZE/BANDAGES/DRESSINGS) ×4 IMPLANT
BANDAGE ESMARK 6X9 LF (GAUZE/BANDAGES/DRESSINGS) IMPLANT
BLADE SAW RECIP 87.9 MT (BLADE) ×2 IMPLANT
BNDG CMPR 9X6 STRL LF SNTH (GAUZE/BANDAGES/DRESSINGS)
BNDG COHESIVE 6X5 TAN STRL LF (GAUZE/BANDAGES/DRESSINGS) ×2 IMPLANT
BNDG ESMARK 6X9 LF (GAUZE/BANDAGES/DRESSINGS)
BNDG GAUZE ELAST 4 BULKY (GAUZE/BANDAGES/DRESSINGS) ×3 IMPLANT
CANISTER SUCTION 2500CC (MISCELLANEOUS) ×2 IMPLANT
CLIP TI MEDIUM 6 (CLIP) IMPLANT
COVER SURGICAL LIGHT HANDLE (MISCELLANEOUS) ×2 IMPLANT
CUFF TOURNIQUET SINGLE 18IN (TOURNIQUET CUFF) IMPLANT
CUFF TOURNIQUET SINGLE 24IN (TOURNIQUET CUFF) IMPLANT
CUFF TOURNIQUET SINGLE 34IN LL (TOURNIQUET CUFF) ×1 IMPLANT
CUFF TOURNIQUET SINGLE 44IN (TOURNIQUET CUFF) IMPLANT
DRAIN CHANNEL 19F RND (DRAIN) IMPLANT
DRAPE ORTHO SPLIT 77X108 STRL (DRAPES) ×4
DRAPE PROXIMA HALF (DRAPES) ×2 IMPLANT
DRAPE SURG ORHT 6 SPLT 77X108 (DRAPES) ×2 IMPLANT
DRAPE U-SHAPE 47X51 STRL (DRAPES) ×2 IMPLANT
DRSG ADAPTIC 3X8 NADH LF (GAUZE/BANDAGES/DRESSINGS) ×2 IMPLANT
ELECT REM PT RETURN 9FT ADLT (ELECTROSURGICAL) ×2
ELECTRODE REM PT RTRN 9FT ADLT (ELECTROSURGICAL) ×1 IMPLANT
EVACUATOR SILICONE 100CC (DRAIN) IMPLANT
GAUZE SPONGE 4X4 12PLY STRL (GAUZE/BANDAGES/DRESSINGS) ×2 IMPLANT
GLOVE BIO SURGEON STRL SZ7.5 (GLOVE) ×2 IMPLANT
GLOVE BIOGEL PI IND STRL 8 (GLOVE) ×1 IMPLANT
GLOVE BIOGEL PI INDICATOR 8 (GLOVE) ×1
GOWN STRL REUS W/ TWL LRG LVL3 (GOWN DISPOSABLE) ×3 IMPLANT
GOWN STRL REUS W/TWL LRG LVL3 (GOWN DISPOSABLE) ×6
KIT BASIN OR (CUSTOM PROCEDURE TRAY) ×2 IMPLANT
KIT ROOM TURNOVER OR (KITS) ×2 IMPLANT
NS IRRIG 1000ML POUR BTL (IV SOLUTION) ×2 IMPLANT
PACK GENERAL/GYN (CUSTOM PROCEDURE TRAY) ×2 IMPLANT
PAD ARMBOARD 7.5X6 YLW CONV (MISCELLANEOUS) ×4 IMPLANT
PADDING CAST COTTON 6X4 STRL (CAST SUPPLIES) IMPLANT
SPONGE GAUZE 4X4 12PLY STER LF (GAUZE/BANDAGES/DRESSINGS) ×2 IMPLANT
STAPLER VISISTAT (STAPLE) ×3 IMPLANT
STOCKINETTE IMPERVIOUS 9X36 MD (GAUZE/BANDAGES/DRESSINGS) ×1 IMPLANT
STOCKINETTE IMPERVIOUS LG (DRAPES) ×2 IMPLANT
SUT ETHILON 3 0 PS 1 (SUTURE) IMPLANT
SUT SILK 0 TIES 10X30 (SUTURE) ×2 IMPLANT
SUT SILK 2 0 (SUTURE) ×2
SUT SILK 2 0 SH CR/8 (SUTURE) ×3 IMPLANT
SUT SILK 2-0 18XBRD TIE 12 (SUTURE) ×1 IMPLANT
SUT SILK 3 0 (SUTURE) ×2
SUT SILK 3-0 18XBRD TIE 12 (SUTURE) ×1 IMPLANT
SUT VIC AB 2-0 CT1 18 (SUTURE) ×2 IMPLANT
TOWEL OR 17X24 6PK STRL BLUE (TOWEL DISPOSABLE) ×2 IMPLANT
TOWEL OR 17X26 10 PK STRL BLUE (TOWEL DISPOSABLE) ×2 IMPLANT
UNDERPAD 30X30 INCONTINENT (UNDERPADS AND DIAPERS) ×2 IMPLANT
WATER STERILE IRR 1000ML POUR (IV SOLUTION) ×2 IMPLANT

## 2015-11-08 NOTE — Progress Notes (Signed)
New Admission Note: transfer from PACU  Arrival Method: Strtcher Mental Orientation: a/o x4 Telemetry: none Assessment: Completed Skin: clean dry intact. R AKA with ACE wrap IV:R foot SL Pain:10/10 L AKA will give meds after pharm verification Tubes:none Safety Measures: Safety Fall Prevention Plan has been given, discussed and signed Admission: Completed Unit Orientation: Patient has been orientated to the room, unit and staff.  Family:none present  Orders have been reviewed and implemented. Will continue to monitor the patient. Call light has been placed within reach and bed alarm has been activated.   Retta Mac BSN, RN

## 2015-11-08 NOTE — H&P (View-Only) (Signed)
[facility] OF    CC: Follow up peripheral artery occlusive disease  History of Present Illness [name] is a 55 y.o. female patient of Dr. 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 She is also on HD MWF via right thigh graft. She takes coumadin for a-fib. Pt returns today after phone call from nurse at [facility] on 10/28/15. Reported that pt's stump site is oozing small amt of bloody drainage. Also noted that there is an area that is "split" on the stump incision. Noted the pt. Was in the ED on 7/5, and it was noted that the pt. had fallen on the stump, approx. 3 weeks prior; the ED note of 7/5 also indicated a small opening in the stump incision and small amt. of active bleeding. Per the ED Case Manager, Adv. HH was contacted to initiate wound care.  Pt states that the HD center obtained a culture of the wound at her left BKA stump yesterday. She states she is not taking any antibiotics now by mouth. She states she did not take the gabapentin prescribed on 10/19/15 visit.  She fell about the second week in June 2017, hitting her left BKA stump. I evaluated pt on 10/19/15 re this. Pt states the left BKA hurts during HD since she fell on the left BKA, and she is not able to complete HD due to this. She was taking Tylenol alternating with tramadol for pain which is intermittent. The pain wakes her up in the middle of the night.   She has seen [facility] but cannot proceed due to bleeding she has been having at the stump  She was evaluated in [facility] ED by Dr. Kellie Simmering on 07/27/15 for bleeding from right thigh graft. Her coumadin dose was decreased.   Pt Diabetic: Yes Pt smoker: non-smoker  Pt meds include: Statin :No Betablocker: No ASA: No Other anticoagulants/antiplatelets: coumadin for atrial fib     Past Medical History  Diagnosis Date  . Hypertension   . CVA (cerebral infarction)     right parietal 05/19/2000  . Vaginal bleeding   .  Hyperparathyroidism   . Pneumonia   . Stroke ([place])     2002,no residual  . Peripheral vascular disease ([place])   . GERD (gastroesophageal reflux disease)   . DM (diabetes mellitus) type II controlled peripheral vascular disorder ([place])   . Slip/trip w/o falling due to stepping on object, subs July  2014  . ESRD (end stage renal disease) ([place])     dialysis M/W/F  . Constipation   . Complication of anesthesia     Morphine makes her nauseated, states she has small veins  . Atrial fibrillation ([place])   . Anemia     Social History Social History  Substance Use Topics  . Smoking status: Never Smoker   . Smokeless tobacco: Never Used  . Alcohol Use: No    Family History Family History  Problem Relation Age of Onset  . Hypertension Father   . Kidney disease Mother     Past Surgical History  Procedure Laterality Date  . Parathyroidectomy with autotransplant  12/07/2010  . Knee surgery Right X 2  . Brachial artery graft      x2 for dialysis  . Av fistula placement Right     upper thigh  . Dental extractions Bilateral   . Dilation and curettage of uterus  1986  . Transmetatarsal amputation Left 07/15/2012    Procedure: TRANSMETATARSAL AMPUTATION;  Surgeon: Judeth Cornfield  [name], MD;  Location: [facility] OR;  Service: Vascular;  Laterality: Left;  . Leg surgery Right   . Foot surgery Left     5 TOES AMPUTATED  . Abdominal aortagram N/A 05/27/2012    Procedure: ABDOMINAL Maxcine Ham;  Surgeon: Serafina Mitchell, MD;  Location: [facility] CATH LAB;  Service: Cardiovascular;  Laterality: N/A;  . Lower extremity angiogram Left 05/27/2012    Procedure: LOWER EXTREMITY ANGIOGRAM;  Surgeon: Serafina Mitchell, MD;  Location: [facility] CATH LAB;  Service: Cardiovascular;  Laterality: Left;  lt leg angio  . Tooth extraction  Sep 08, 2014  . Peripheral vascular catheterization N/A 01/11/2015    Procedure: Lower Extremity Angiography;  Surgeon: Serafina Mitchell, MD;  Location: [place] CV LAB;  Service: Cardiovascular;   Laterality: N/A;  . I&d extremity Left 05/05/2015    Procedure: DEBRIDEMENT HEEL WOUND;  Surgeon: Angelia Mould, MD;  Location: [place];  Service: Vascular;  Laterality: Left;  . Application of wound vac Left 05/05/2015    Procedure: APPLICATION OF WOUND VAC;  Surgeon: Angelia Mould, MD;  Location: [place];  Service: Vascular;  Laterality: Left;  . Amputation Left 06/28/2015    Procedure:  Left BELOW KNEE amputation;  Surgeon: Angelia Mould, MD;  Location: [place];  Service: Vascular;  Laterality: Left;    Allergies  Allergen Reactions  . Doxycycline Itching  . Peroxyl [Hydrogen Peroxide] Hives and Rash    Current Outpatient Prescriptions  Medication Sig Dispense Refill  . acetaminophen (TYLENOL) 325 MG tablet Take 650 mg by mouth every Monday, Wednesday, and Friday. With dialysis    . B Complex-C-Folic Acid (RENA-VITE RX) 1 MG TABS Take 1 mg by mouth daily.  1  . CALCIUM-VITAMIN D PO Take 1 tablet by mouth 3 (three) times daily with meals.     . Cyanocobalamin (VITAMIN B-12 PO) Take 1 tablet by mouth 4 (four) times a week. Non dialysis days. Tues, Thurs, Sat and Sunday    . diazepam (VALIUM) 5 MG tablet Take 5 mg by mouth every 8 (eight) hours as needed for muscle spasms.   0  . diltiazem (CARDIZEM CD) 120 MG 24 hr capsule Take 1 capsule (120 mg total) by mouth daily. 60 capsule 0  . glipiZIDE (GLUCOTROL) 10 MG tablet Take 0.5 tablets (5 mg total) by mouth daily. 45 tablet 3  . ibuprofen (ADVIL,MOTRIN) 800 MG tablet Take 800 mg by mouth every 8 (eight) hours as needed for mild pain.     Marland Kitchen oxyCODONE (OXY IR/ROXICODONE) 5 MG immediate release tablet Take 1-2 tablets (5-10 mg total) by mouth every 4 (four) hours as needed for moderate pain. 30 tablet 0  . polyethylene glycol (MIRALAX / GLYCOLAX) packet Take 17 g by mouth daily. 14 each 0  . silver sulfADIAZINE (SILVADENE) 1 % cream Apply 1 application topically daily. 50 g 0  . traMADol (ULTRAM) 50 MG tablet Take 1-2 mg by  mouth every 6 (six) hours as needed for moderate pain. Reported on 10/19/2015    . vitamin C (ASCORBIC ACID) 500 MG tablet Take 500 mg by mouth daily.    . vitamin E 400 UNIT capsule Take 400 Units by mouth daily.    Marland Kitchen warfarin (COUMADIN) 6 MG tablet Take 1 tablet by mouth daily or as directed by coumadin clinic (Patient taking differently: Take 6 mg by mouth daily at 6 PM. ) 30 tablet 1  . gabapentin (NEURONTIN) 100 MG capsule Take 1 capsule (100 mg total) by  mouth 3 (three) times daily. (Patient not taking: Reported on 11/01/2015) 60 capsule 1   No current facility-administered medications for this visit.    ROS: See HPI for pertinent positives and negatives.   Physical Examination  Filed Vitals:   11/01/15 1136  BP: 186/92  Pulse: 109  Temp: 98.3 F (36.8 C)  TempSrc: Oral  Resp: 16  Height: 5\' 2"  (1.575 m)  Weight: 154 lb 5.2 oz (70 kg)  SpO2: 96%   Body mass index is 28.22 kg/(m^2).  General: A&O x 3, WDWN, obese female. Hirsute face. Gait: seated in wheelchair Eyes: PERRLA. Pulmonary: Respirations are non labored Cardiac: regular rhythm, no detected murmur.     Carotid Bruits Right Left   Negative Negative  Aorta is not palpable. Radial pulses: 1+ palpable   VASCULAR EXAM: Extremities:. Left BKA with open wound measuring 7 cm lateral to proximal, 1 to 1.5 cm wide, with surrounding eschar measuring 8 cm anterior to posterior and 9 cm lateral to proximal. + Foul odor. Fibrinous exudate in open wound.   Left BKA is tender to touch, minimal swelling.  Palpable thrill at right thigh graft, no bleeding.      LE Pulses Right Left   FEMORAL not palpable, pt seated in wheelchair not palpable, pt seated in wheelchair    POPLITEAL not palpable  not palpable   POSTERIOR  TIBIAL not palpable  BKA     DORSALIS PEDIS  ANTERIOR TIBIAL not palpable  BKA    Abdomen: soft, NT, no palpable masses. Skin: no rashes, see Extremities. Musculoskeletal: no muscle wasting or atrophy, see Extremities. Neurologic: A&O X 3; Appropriate Affect ; SENSATION: normal; MOTOR FUNCTION: moving all extremities equally, motor strength 5/5 throughout. Speech is fluent/normal, soft spoken.  CN 2-12 grossly intact.          ASSESSMENT: Amoria Eschete is a 55 y.o. 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 She is also on HD MWF via right thigh graft. She takes coumadin for a-fib. Pt returns today after phone call from nurse at [facility] on 10/28/15. Reported that pt's stump site is oozing small amt of bloody drainage. Also noted that there is an area that is "split" on the stump incision. Noted the pt. Was in the ED on 7/5, and it was noted that the pt. had fallen on the stump, approx. 3 weeks prior; the ED note of 7/5 also indicated a small opening in the stump incision and small amt. of active bleeding. Per the ED Case Manager, Adv. HH was contacted to initiate wound care.  Pt states that the HD center obtained a culture of the wound at her left BKA stump yesterday. She states she is not taking any antibiotics now by mouth. She states she did not take the gabapentin prescribed on 10/19/15 visit. Pt states [name] has visited her recently. Today the left BKA has an open wound measuring 7 cm lateral to proximal, 1 to 1.5 cm wide, with surrounding eschar measuring 8 cm anterior to posterior and 9 cm lateral to proximal. + Foul odor. Fibrinous exudate in open wound.   Left BKA is tender to touch, minimal swelling.  Dr. Donnetta Hutching spoke with and examined pt. The wound no longer has viable tissue to heal adequately.    PLAN:  Schedule left AKA on a Tuesday or Thursday, on non HD day, pt wants time to think about this. Will advise Dr. [name]. In the interim,  will advise [facility], seeing pt  now, to perform wet to dry dressing changes left BKA stump open wound. Keflex 500 mg po bid x 2 weeks.  I discussed in depth with the patient the nature of atherosclerosis, and emphasized the importance of maximal medical management including strict control of blood pressure, blood glucose, and lipid levels, obtaining regular exercise, and continued cessation of smoking.  The patient is aware that without maximal medical management the underlying atherosclerotic disease process will progress, limiting the benefit of any interventions.  The patient was given information about PAD including signs, symptoms, treatment, what symptoms should prompt the patient to seek immediate medical care, and risk reduction measures to take.  Clemon Chambers, RN, MSN, FNP-C [facility] [employer] Phone: [phone]  Clinic MD: Early  11/01/2015 12:27 PM

## 2015-11-08 NOTE — Transfer of Care (Signed)
Immediate Anesthesia Transfer of Care Note  Patient: [name]  Procedure(s) Performed: Procedure(s): AMPUTATION ABOVE KNEE / LEFT (Left)  Patient Location: PACU  Anesthesia Type:General  Level of Consciousness: awake, alert  and oriented  Airway & Oxygen Therapy: Patient Spontanous Breathing and Patient connected to nasal cannula oxygen  Post-op Assessment: Report given to RN and Post -op Vital signs reviewed and stable  Post vital signs: Reviewed and stable  Last Vitals:  Filed Vitals:   11/08/15 1211 11/08/15 1351  BP:    Pulse: 112   Temp:  36.3 C  Resp:      Last Pain: There were no vitals filed for this visit.       Complications: No apparent anesthesia complications

## 2015-11-08 NOTE — Anesthesia Preprocedure Evaluation (Signed)
Anesthesia Evaluation  Patient identified by MRN, date of birth, ID band Patient awake    Reviewed: Allergy & Precautions, NPO status , Patient's Chart, lab work & pertinent test results  Airway Mallampati: II   Neck ROM: full    Dental   Pulmonary    breath sounds clear to auscultation       Cardiovascular hypertension, + Peripheral Vascular Disease   Rhythm:regular Rate:Normal     Neuro/Psych  Neuromuscular disease CVA    GI/Hepatic GERD  ,  Endo/Other  diabetes, Type 2  Renal/GU ESRF and DialysisRenal disease     Musculoskeletal   Abdominal   Peds  Hematology  (+) anemia ,   Anesthesia Other Findings   Reproductive/Obstetrics                             Anesthesia Physical Anesthesia Plan  ASA: III  Anesthesia Plan: General   Post-op Pain Management:    Induction: Intravenous  Airway Management Planned: LMA  Additional Equipment:   Intra-op Plan:   Post-operative Plan:   Informed Consent: I have reviewed the patients History and Physical, chart, labs and discussed the procedure including the risks, benefits and alternatives for the proposed anesthesia with the patient or authorized representative who has indicated his/her understanding and acceptance.     Plan Discussed with: CRNA, Anesthesiologist and Surgeon  Anesthesia Plan Comments:         Anesthesia Quick Evaluation

## 2015-11-08 NOTE — Anesthesia Procedure Notes (Signed)
Procedure Name: LMA Insertion Date/Time: 11/08/2015 12:39 PM Performed by: Mariea Clonts Pre-anesthesia Checklist: Patient identified, Emergency Drugs available, Suction available, Patient being monitored and Timeout performed Patient Re-evaluated:Patient Re-evaluated prior to inductionOxygen Delivery Method: Circle system utilized Preoxygenation: Pre-oxygenation with 100% oxygen Intubation Type: IV induction LMA: LMA inserted LMA Size: 4.0 Number of attempts: 1 Placement Confirmation: positive ETCO2 and breath sounds checked- equal and bilateral Tube secured with: Tape Dental Injury: Teeth and Oropharynx as per pre-operative assessment

## 2015-11-08 NOTE — Anesthesia Postprocedure Evaluation (Signed)
Anesthesia Post Note  Patient: [name]  Procedure(s) Performed: Procedure(s) (LRB): AMPUTATION ABOVE KNEE / LEFT (Left)  Patient location during evaluation: PACU Anesthesia Type: General Level of consciousness: awake and alert and patient cooperative Pain management: pain level controlled Vital Signs Assessment: post-procedure vital signs reviewed and stable Respiratory status: spontaneous breathing and respiratory function stable Cardiovascular status: stable Anesthetic complications: no    Last Vitals:  Filed Vitals:   11/08/15 1400 11/08/15 1405  BP:  161/74  Pulse: 97 99  Temp:    Resp: 14 21    Last Pain:  Filed Vitals:   11/08/15 1427  PainSc: 10-Worst pain ever                 Pauletta Pickney S

## 2015-11-08 NOTE — Interval H&P Note (Signed)
History and Physical Interval Note:  11/08/2015 12:09 PM  [name]  has presented today for surgery, with the diagnosis of Left Below Knee stump wound post traumatic T79.8XXD Wound Complication open [id number]  The various methods of treatment have been discussed with the patient and family. After consideration of risks, benefits and other options for treatment, the patient has consented to  Procedure(s): AMPUTATION ABOVE KNEE / LEFT (Left) as a surgical intervention .  The patient's history has been reviewed, patient examined, no change in status, stable for surgery.  I have reviewed the patient's chart and labs.  Questions were answered to the patient's satisfaction.     Deitra Mayo

## 2015-11-08 NOTE — Op Note (Signed)
    NAME: [name]   MRN: [id number] DOB: 1960-11-01    DATE OF OPERATION: 11/08/2015  PREOP DIAGNOSIS: Nonhealing left below the knee amputation.  POSTOP DIAGNOSIS: Same  PROCEDURE: Left above-the-knee amputation.  SURGEON: Judeth Cornfield. [name], MD, FACS  ASSIST: Delena Serve, rnfa   ANESTHESIA: Gen.   EBL: minimal  INDICATIONS: Sabrina Miyazaki is a 55 y.o. female who underwent a previous left below-the-knee amputation.  This had been healing well and she was being fitted for prosthesis but she fell and injured the below-the-knee amputation site and developed a full-thickness wound. Ultimately this was not salvageable and above-the-knee amputation was recommended.  FINDINGS: The muscle above the knee was well perfused.  TECHNIQUE: The patient was taken to the operating room and received a general anesthetic. The left lower extremity was prepped and draped in the usual sterile fashion. A tourniquet was placed on the upper thigh. A fishmouth incision was marked above the level of the patella. The leg was exsanguinated with an Esmarch bandage. The tourniquet was inflated to 300 mmHg. Under tourniquet control, the incision was carried down to the skin, subcutaneous tissue, fascia, muscle to the femur which was dissected free circumferentially. The bone was divided proximal to the level of skin division. The superficial femoral artery and femoral vein were individually suture ligated with 2-0 silk ties. The tourniquet was then released. Additional hemostasis was obtained using electrocautery and 2-0 silk ties. The edges of the bone were rasped. The wound was irrigated with copious amounts of saline.  The fascial layer was closed with interrupted 2-0 Vicryl's. The skin was closed with staples. A sterile dressing was applied. The patient tolerated the procedure well and was transferred to the recovery room in stable condition. All needle and sponge counts were correct.  Deitra Mayo, MD, FACS [facility]  DATE OF DICTATION:   11/08/2015

## 2015-11-08 NOTE — Progress Notes (Signed)
Pharmacy Antibiotic Note  [name] is a 55 y.o. female admitted on 11/08/2015 with surgical prophylaxis.  Pharmacy has been consulted for zinacef dosing. She was given zinacef 1.5 gm at 1247.  She is a HD pt so this dose will cover her until her next HD  Plan: No post-op zinacef doses required. Pre-op dose will cover until next HD  Height: 5\' 2"  (157.5 cm) Weight: 154 lb 5 oz (69.996 kg) IBW/kg (Calculated) : 50.1  Temp (24hrs), Avg:98 F (36.7 C), Min:97.4 F (36.3 C), Max:98.9 F (37.2 C)   Recent Labs Lab 11/08/15 1133  WBC 8.4  CREATININE 7.50*    Estimated Creatinine Clearance: 8 mL/min (by C-G formula based on Cr of 7.5).    Allergies  Allergen Reactions  . Doxycycline Itching  . Peroxyl [Hydrogen Peroxide] Hives and Rash   Eudelia Bunch, Pharm.D. [id number] 11/08/2015 5:01 PM

## 2015-11-09 ENCOUNTER — Encounter (HOSPITAL_COMMUNITY): Payer: Self-pay | Admitting: Vascular Surgery

## 2015-11-09 DIAGNOSIS — Z89612 Acquired absence of left leg above knee: Secondary | ICD-10-CM

## 2015-11-09 LAB — BASIC METABOLIC PANEL
ANION GAP: 13 (ref 5–15)
BUN: 21 mg/dL — ABNORMAL HIGH (ref 6–20)
CHLORIDE: 95 mmol/L — AB (ref 101–111)
CO2: 30 mmol/L (ref 22–32)
Calcium: 8.9 mg/dL (ref 8.9–10.3)
Creatinine, Ser: 9.21 mg/dL — ABNORMAL HIGH (ref 0.44–1.00)
GFR calc Af Amer: 5 mL/min — ABNORMAL LOW (ref >60)
GFR, EST NON AFRICAN AMERICAN: 4 mL/min — AB (ref >60)
GLUCOSE: 139 mg/dL — AB (ref 65–99)
POTASSIUM: 4.5 mmol/L (ref 3.5–5.1)
Sodium: 138 mmol/L (ref 135–145)

## 2015-11-09 LAB — CBC
HEMATOCRIT: 33.7 % — AB (ref 36.0–46.0)
HEMOGLOBIN: 10.4 g/dL — AB (ref 12.0–15.0)
MCH: 27.9 pg (ref 26.0–34.0)
MCHC: 30.9 g/dL (ref 30.0–36.0)
MCV: 90.3 fL (ref 78.0–100.0)
Platelets: 288 10*3/uL (ref 150–400)
RBC: 3.73 MIL/uL — ABNORMAL LOW (ref 3.87–5.11)
RDW: 18.1 % — ABNORMAL HIGH (ref 11.5–15.5)
WBC: 10.8 10*3/uL — ABNORMAL HIGH (ref 4.0–10.5)

## 2015-11-09 LAB — GLUCOSE, CAPILLARY
Glucose-Capillary: 148 mg/dL — ABNORMAL HIGH (ref 65–99)
Glucose-Capillary: 232 mg/dL — ABNORMAL HIGH (ref 65–99)
Glucose-Capillary: 94 mg/dL (ref 65–99)
Glucose-Capillary: 304 mg/dL — ABNORMAL HIGH (ref 65–99)

## 2015-11-09 LAB — MRSA PCR SCREENING: MRSA by PCR: NEGATIVE

## 2015-11-09 LAB — PHOSPHORUS: Phosphorus: 6 mg/dL — ABNORMAL HIGH (ref 2.5–4.6)

## 2015-11-09 LAB — PROTIME-INR
INR: 1.27 (ref 0.00–1.49)
Prothrombin Time: 16 seconds — ABNORMAL HIGH (ref 11.6–15.2)

## 2015-11-09 MED ORDER — TRAMADOL HCL 50 MG PO TABS
ORAL_TABLET | ORAL | Status: AC
  Filled 2015-11-09: qty 1

## 2015-11-09 MED ORDER — CALCIUM CARBONATE-VITAMIN D 500-200 MG-UNIT PO TABS
3.0000 | ORAL_TABLET | Freq: Three times a day (TID) | ORAL | Status: DC
  Administered 2015-11-09 – 2015-11-12 (×10): 3 via ORAL
  Filled 2015-11-09 (×7): qty 3
  Filled 2015-11-09: qty 1
  Filled 2015-11-09 (×2): qty 3

## 2015-11-09 MED ORDER — NEPRO/CARBSTEADY PO LIQD
237.0000 mL | Freq: Every day | ORAL | Status: DC
  Administered 2015-11-09 – 2015-11-10 (×2): 237 mL via ORAL

## 2015-11-09 MED ORDER — GLIPIZIDE 5 MG PO TABS
5.0000 mg | ORAL_TABLET | Freq: Every day | ORAL | Status: DC
  Administered 2015-11-09 – 2015-11-11 (×3): 5 mg via ORAL
  Filled 2015-11-09 (×5): qty 1

## 2015-11-09 NOTE — Consult Note (Signed)
[facility] Renal Consultation Note    Indication for Consultation:  Management of ESRD/hemodialysis; anemia, hypertension/volume and secondary hyperparathyroidism PCP: Chari Manning, NP   HPI: [name] is a 55 y.o. female with ESRD on hemodialysis since 04/1999 2/2 HTN/DM. She has HD MWF at [facility]. Past medical history significant for hypertension, DMT2, Afib on coumadin, R parietal CVA 2002,  PAD, PNA, R pleural effusion, GERD, gastric ulcer, anemia of chronic disease, SHPT.   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 Patient had been doing well, had been fitted for prothesisi and fell at home in June, 2017, hitting stump. She developed non-healing ulcer L stump which became infected. Cultures were done of L BKA at dialysis center which were positive for gram positive and gram negative bacilli. She has been receiving vancomycin and fortaz at HD center.  She was referred back to [facility] and had L AKA per Dr. [name] 11/08/15. She is post op day #1. C/O soreness L stump, L side of body. No other C/O at present but says she doesn't want to have HD today because she is so sore. Told her we'd do treatment later in day and she is agreeable to this.    Past Medical History  Diagnosis Date  . Hypertension   . CVA (cerebral infarction)     right parietal 05/19/2000  . Vaginal bleeding   . Hyperparathyroidism   . Pneumonia   . Stroke ([place])     2002,no residual  . Peripheral vascular disease ([place])   . GERD (gastroesophageal reflux disease)   . DM (diabetes mellitus) type II controlled peripheral vascular disorder ([place])   . Slip/trip w/o falling due to stepping on object, subs July  2014  . ESRD (end stage renal disease) ([place])     dialysis M/W/F  . Constipation   . Atrial fibrillation ([place])   . Anemia   . Complication of anesthesia     Morphine makes her nauseated, states she has small veins  . Wound infection, posttraumatic ([place])     left BKA    Past Surgical History  Procedure Laterality Date  . Parathyroidectomy with autotransplant  12/07/2010  . Knee surgery Right X 2  . Brachial artery graft      x2 for dialysis  . Av fistula placement Right     upper thigh  . Dental extractions Bilateral   . Dilation and curettage of uterus  1986  . Transmetatarsal amputation Left 07/15/2012    Procedure: TRANSMETATARSAL AMPUTATION;  Surgeon: Angelia Mould, MD;  Location: [facility] OR;  Service: Vascular;  Laterality: Left;  . Leg surgery Right   . Foot surgery Left     5 TOES AMPUTATED  . Abdominal aortagram N/A 05/27/2012    Procedure: ABDOMINAL Maxcine Ham;  Surgeon: Serafina Mitchell, MD;  Location: [facility] CATH LAB;  Service: Cardiovascular;  Laterality: N/A;  . Lower extremity angiogram Left 05/27/2012    Procedure: LOWER EXTREMITY ANGIOGRAM;  Surgeon: Serafina Mitchell, MD;  Location: [facility] CATH LAB;  Service: Cardiovascular;  Laterality: Left;  lt leg angio  . Tooth extraction  Sep 08, 2014  . Peripheral vascular catheterization N/A 01/11/2015    Procedure: Lower Extremity Angiography;  Surgeon: Serafina Mitchell, MD;  Location: [place] CV LAB;  Service: Cardiovascular;  Laterality: N/A;  . I&d extremity Left 05/05/2015    Procedure: DEBRIDEMENT HEEL WOUND;  Surgeon: Angelia Mould, MD;  Location: [place];  Service: Vascular;  Laterality: Left;  .  Application of wound vac Left 05/05/2015    Procedure: APPLICATION OF WOUND VAC;  Surgeon: Angelia Mould, MD;  Location: [place];  Service: Vascular;  Laterality: Left;  . Amputation Left 06/28/2015    Procedure:  Left BELOW KNEE amputation;  Surgeon: Angelia Mould, MD;  Location: [place];  Service: Vascular;  Laterality: Left;  . Amputation Left 11/08/2015    Procedure: AMPUTATION ABOVE KNEE / LEFT;  Surgeon: Angelia Mould, MD;  Location: [facility] OR;  Service: Vascular;  Laterality: Left;   Family History  Problem Relation Age of Onset  . Hypertension Father   . Kidney disease  Mother    Social History:  reports that she has never smoked. She has never used smokeless tobacco. She reports that she does not drink alcohol or use illicit drugs. Allergies  Allergen Reactions  . Doxycycline Itching  . Peroxyl [Hydrogen Peroxide] Hives and Rash   Prior to Admission medications   Medication Sig Start Date End Date Taking? Authorizing Provider  acetaminophen (TYLENOL) 325 MG tablet Take 650 mg by mouth every Monday, Wednesday, and Friday. With dialysis   Yes Historical Provider, MD  B Complex-C-Folic Acid (RENA-VITE RX) 1 MG TABS Take 1 mg by mouth daily. 05/26/15  Yes Historical Provider, MD  CALCIUM-VITAMIN D PO Take 1 tablet by mouth 3 (three) times daily with meals.    Yes Historical Provider, MD  cephALEXin (KEFLEX) 500 MG capsule Take 1 capsule (500 mg total) by mouth 2 (two) times daily. 11/01/15  Yes Suzanne L Nickel, NP  Cyanocobalamin (VITAMIN B-12 PO) Take 1 tablet by mouth 4 (four) times a week. Non dialysis days. Cain Saupe, Sat and Sunday   Yes Historical Provider, MD  diazepam (VALIUM) 5 MG tablet Take 5 mg by mouth every 8 (eight) hours as needed for muscle spasms.  11/15/14  Yes Historical Provider, MD  diltiazem (CARDIZEM CD) 120 MG 24 hr capsule Take 1 capsule (120 mg total) by mouth daily. 03/27/15  Yes Shanker Kristeen Mans, MD  glipiZIDE (GLUCOTROL) 10 MG tablet Take 0.5 tablets (5 mg total) by mouth daily. 11/23/14  Yes Lance Bosch, NP  ibuprofen (ADVIL,MOTRIN) 800 MG tablet Take 800 mg by mouth every 8 (eight) hours as needed for mild pain.    Yes Historical Provider, MD  oxyCODONE (OXY IR/ROXICODONE) 5 MG immediate release tablet Take 1-2 tablets (5-10 mg total) by mouth every 4 (four) hours as needed for moderate pain. 07/02/15  Yes Ulyses Amor, PA-C  polyethylene glycol (MIRALAX / GLYCOLAX) packet Take 17 g by mouth daily. 08/19/14  Yes Reyne Dumas, MD  silver sulfADIAZINE (SILVADENE) 1 % cream Apply 1 application topically daily. 10/26/15  Yes Heather  Laisure, PA-C  traMADol (ULTRAM) 50 MG tablet Take 50-100 mg by mouth every 6 (six) hours as needed for moderate pain. Reported on 10/19/2015   Yes Historical Provider, MD  vitamin C (ASCORBIC ACID) 500 MG tablet Take 500 mg by mouth daily.   Yes Historical Provider, MD  vitamin E 400 UNIT capsule Take 400 Units by mouth daily.   Yes Historical Provider, MD  gabapentin (NEURONTIN) 100 MG capsule Take 1 capsule (100 mg total) by mouth 3 (three) times daily. Patient not taking: Reported on 11/01/2015 10/19/15   Sharmon Leyden Nickel, NP  warfarin (COUMADIN) 6 MG tablet Take 1 tablet by mouth daily or as directed by coumadin clinic Patient taking differently: Take 6 mg by mouth daily at 6 PM.  09/28/15   Shanon Brow  Loren Racer, MD   Current Facility-Administered Medications  Medication Dose Route Frequency Provider Last Rate Last Dose  . acetaminophen (TYLENOL) tablet 325-650 mg  325-650 mg Oral Q4H PRN Angelia Mould, MD       Or  . acetaminophen (TYLENOL) suppository 325-650 mg  325-650 mg Rectal Q4H PRN Angelia Mould, MD      . acetaminophen (TYLENOL) tablet 650 mg  650 mg Oral Q M,W,F Angelia Mould, MD      . alum & mag hydroxide-simeth (MAALOX/MYLANTA) 200-200-20 MG/5ML suspension 15-30 mL  15-30 mL Oral Q2H PRN Angelia Mould, MD      . calcium-vitamin D (OSCAL WITH D) 500-200 MG-UNIT per tablet 1 tablet  1 tablet Oral TID Angelia Mould, MD   1 tablet at 11/08/15 2139  . cephALEXin (KEFLEX) capsule 500 mg  500 mg Oral BID Angelia Mould, MD   500 mg at 11/08/15 2139  . diazepam (VALIUM) tablet 5 mg  5 mg Oral Q8H PRN Angelia Mould, MD      . diltiazem (CARDIZEM CD) 24 hr capsule 120 mg  120 mg Oral Daily Angelia Mould, MD   120 mg at 11/08/15 1727  . docusate sodium (COLACE) capsule 100 mg  100 mg Oral Daily Angelia Mould, MD      . enoxaparin (LOVENOX) injection 30 mg  30 mg Subcutaneous Q24H Angelia Mould, MD   30 mg at 11/09/15  0840  . gabapentin (NEURONTIN) capsule 100 mg  100 mg Oral TID Angelia Mould, MD   100 mg at 11/08/15 2139  . glipiZIDE (GLUCOTROL) tablet 5 mg  5 mg Oral QAC breakfast Skeet Simmer, [facility]      . guaiFENesin-dextromethorphan (ROBITUSSIN DM) 100-10 MG/5ML syrup 15 mL  15 mL Oral Q4H PRN Angelia Mould, MD      . hydrALAZINE (APRESOLINE) injection 5 mg  5 mg Intravenous Q20 Min PRN Angelia Mould, MD      . HYDROmorphone (DILAUDID) injection 0.5-1 mg  0.5-1 mg Intravenous Q2H PRN Angelia Mould, MD   1 mg at 11/08/15 1725  . ibuprofen (ADVIL,MOTRIN) tablet 800 mg  800 mg Oral Q8H PRN Angelia Mould, MD      . insulin aspart (novoLOG) injection 0-15 Units  0-15 Units Subcutaneous TID WC Angelia Mould, MD   2 Units at 11/09/15 0840  . labetalol (NORMODYNE,TRANDATE) injection 10 mg  10 mg Intravenous Q10 min PRN Angelia Mould, MD      . magnesium sulfate IVPB 2 g 50 mL  2 g Intravenous Once PRN Angelia Mould, MD      . metoprolol (LOPRESSOR) injection 2-5 mg  2-5 mg Intravenous Q2H PRN Angelia Mould, MD      . multivitamin (RENA-VIT) tablet 1 tablet  1 tablet Oral QHS Angelia Mould, MD   1 tablet at 11/08/15 2139  . ondansetron (ZOFRAN) injection 4 mg  4 mg Intravenous Q6H PRN Angelia Mould, MD      . oxyCODONE-acetaminophen (PERCOCET/ROXICET) 5-325 MG per tablet 1-2 tablet  1-2 tablet Oral Q4H PRN Angelia Mould, MD   2 tablet at 11/09/15 0902  . pantoprazole (PROTONIX) EC tablet 40 mg  40 mg Oral Daily Angelia Mould, MD   40 mg at 11/08/15 1727  . phenol (CHLORASEPTIC) mouth spray 1 spray  1 spray Mouth/Throat PRN Angelia Mould, MD      . polyethylene glycol (  MIRALAX / GLYCOLAX) packet 17 g  17 g Oral Daily Angelia Mould, MD      . potassium chloride SA (K-DUR,KLOR-CON) CR tablet 20-40 mEq  20-40 mEq Oral Once PRN Angelia Mould, MD      . traMADol Veatrice Bourbon) tablet 25-50 mg  25-50  mg Oral Q6H PRN Angelia Mould, MD      . vitamin B-12 (CYANOCOBALAMIN) tablet 50 mcg  50 mcg Oral Once per day on Sun Tue Thu Sat Angelia Mould, MD   50 mcg at 11/08/15 1727  . vitamin C (ASCORBIC ACID) tablet 500 mg  500 mg Oral Daily Angelia Mould, MD      . vitamin E capsule 400 Units  400 Units Oral Daily Angelia Mould, MD      . warfarin (COUMADIN) tablet 6 mg  6 mg Oral q1800 Angelia Mould, MD   6 mg at 11/08/15 1734  . Warfarin - Physician Dosing Inpatient   Does not apply q1800 Angelia Mould, MD   0  at 11/08/15 1800   Labs: Basic Metabolic Panel:  Recent Labs Lab 11/08/15 1133 11/09/15 0608  NA 138 138  K 4.1 4.5  CL 95* 95*  CO2 29 30  GLUCOSE 142* 139*  BUN 16 21*  CREATININE 7.50* 9.21*  CALCIUM 8.9 8.9   Liver Function Tests:  Recent Labs Lab 11/08/15 1133  AST 20  ALT 16  ALKPHOS 54  BILITOT 0.5  PROT 8.2*  ALBUMIN 2.8*   CBC:  Recent Labs Lab 11/08/15 1133 11/09/15 0608  WBC 8.4 10.8*  HGB 11.1* 10.4*  HCT 36.4 33.7*  MCV 90.5 90.3  PLT 281 288   CBG:  Recent Labs Lab 11/08/15 0934 11/08/15 1353 11/08/15 1653 11/08/15 2017 11/09/15 0750  GLUCAP 154* 139* 151* 164* 148*   Studies/Results: No results found.  ROS: As per HPI otherwise negative.   Physical Exam: Filed Vitals:   11/08/15 1615 11/08/15 1657 11/08/15 2018 11/09/15 0602  BP:  176/75 164/72 162/104  Pulse:  95 107 112  Temp: 97.9 F (36.6 C) 97.8 F (36.6 C) 98.7 F (37.1 C) 99.1 F (37.3 C)  TempSrc:  Oral Oral Oral  Resp:  16 16 20   Height:      Weight:  71.26 kg (157 lb 1.6 oz)  69.718 kg (153 lb 11.2 oz)  SpO2:  100% 100% 100%     General: Well developed, well nourished, in no acute distress. Head: Normocephalic, atraumatic, sclera non-icteric, mucus membranes are moist Neck: Supple. JVD not elevated. Lungs: Clear bilaterally to auscultation without wheezes, rales, or rhonchi. Breathing is unlabored. Heart:  RRR with S1 S2. No murmurs, rubs, or gallops appreciated. Abdomen: Soft, non-tender, non-distended with normoactive bowel sounds. No rebound/guarding. No obvious abdominal masses. M-S:  Strength and tone appear normal for age. Lower extremities: L. AKA ace wrap intact. CDI. No RLE edema Neuro: Alert and oriented X 3. Moves all extremities spontaneously. Psych:  Responds to questions appropriately with a normal affect. Dialysis Access: R thigh AVG + bruit  Dialysis Orders: [facility] MWF 3.5 hours 450/Auto 1.5  EDW 70 kg 2.0 K/2.0 Ca  UF profile 2 Linear Na Heparin 1000 units IV q tx Mircera 100 mcg IV q 2 weeks (11/02/15 Last HGB 10.5 11/02/15) Venofer 50 mg IV weekly (last dose 11/02/15 Fe 65 Tsat 39 10/19/15) Calcium Carb 3 capsules TID with meals. No calcimimetic/VDRA.    Assessment/Plan: 1.  Infected L Stump/L AKA: Per  primary. Pain controlled, some phantom pain.  2.  ESRD -  MWF at [facility]. Will have HD today on schedule. K+ 4.5. No heparin.  3.  Hypertension/volume  - SBP in 170s-160s. Wt today 69.7 (bed wt). Adjust EDW for AKA. Will attempt UFG 1-1.5 liters. On cardizem 120 mg PO daily as OP. PRN labetalol ordered.  4.  Anemia  - HGB 10.4. Recent ESA dose. Follow HGB. Give Venofer today.  5.  Metabolic bone disease -  Continue binders. Ca 8.9 C Ca 9.86 6.  Nutrition - Albumin 2.8. Change to renal/Carb mod diet with fld restrictions. Renal vit/prostat 7. DM: per primary 8. Coumadin therapy/AFib. Per pharmacy. INR 1.23  Rita H. Owens Shark, NP-C 11/09/2015, 10:24 AM  [employer] [phone]      I have seen and examined this patient and agree with plan as outlined by Juanell Fairly, NP-C.  Continue with HD on MWF schedule while she remains an inpatient. Broadus John A Osiah Haring,MD 11/09/2015 12:40 PM

## 2015-11-09 NOTE — Evaluation (Signed)
Physical Therapy Evaluation Patient Details Name: [name] MRN: [id number] DOB: [birth year] Today's Date: 11/09/2015   History of Present Illness  [name] is a 55 y.o. female patient of Dr. 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 She is also on HD MWF via right thigh graft. Now s/p conversion of L BKA to AKA;  has a past medical history of Hypertension; CVA (cerebral infarction); Vaginal bleeding; Hyperparathyroidism; Pneumonia; Stroke [facility]); Peripheral vascular disease ([place]); GERD (gastroesophageal reflux disease); DM (diabetes mellitus) type II controlled peripheral vascular disorder ([place]); Slip/trip w/o falling due to stepping on object, subs (July  2014); ESRD (end stage renal disease) ([place]); Constipation; Atrial fibrillation ([place]); Anemia; Complication of anesthesia; and Wound infection, posttraumatic ([place]).  Clinical Impression   Patient is s/p above surgery resulting in functional limitations due to the deficits listed below (see PT Problem List).  Patient will benefit from skilled PT to increase their independence and safety with mobility to allow discharge to the venue listed below.       Follow Up Recommendations SNF    Equipment Recommendations  None recommended by PT    Recommendations for Other Services       Precautions / Restrictions Precautions Precautions: Fall Restrictions Weight Bearing Restrictions: Yes LLE Weight Bearing: Non weight bearing      Mobility  Bed Mobility Overal bed mobility: Needs Assistance;+2 for physical assistance Bed Mobility: Supine to Sit     Supine to sit: +2 for physical assistance;Total assist     General bed mobility comments: Cues to initiaite using RLE to half bridge to EOB; limited effort; physical assist using bed pad to assist hips to EOB; noted pt used rails to lift shoulders and trunk to semi-long-sit without physical assist; +2 to clear RLE from edge and elevate trunk to sit EOB  Transfers Overall transfer  level: Needs assistance Equipment used:  (bed pad to cradle and slide hips) Transfers: Lateral/Scoot Transfers          Lateral/Scoot Transfers: +2 physical assistance;Total assist (3rd person to steady recliner) General transfer comment: Pt hesitant to transfer OOB despite encouragement; Ultimately performed a lateral scoot transfer to pt's right side with drop-arm lowered; Dependent transfer, sliding hips to recliner with bed pad  Ambulation/Gait                Stairs            Wheelchair Mobility    Modified Rankin (Stroke Patients Only)       Balance Overall balance assessment: Needs assistance Sitting-balance support: Bilateral upper extremity supported (R foot suported) Sitting balance-Leahy Scale: Zero Sitting balance - Comments: Needing constant support posteriorly as pt tended to push back while sitting EOB in relative unsupported sitting; able to perform some ADLs in supported sitting in recliner                                     Pertinent Vitals/Pain      Home Living Family/patient expects to be discharged to:: Private residence Living Arrangements: Alone   Type of Home: Apartment Home Access: Stairs to enter   [employer] of Steps: 3 Home Layout: One level Home Equipment: [place] - 2 wheels;Walker - 4 wheels;Bedside commode;Shower seat      Prior Function Level of Independence: Needs assistance   Gait / Transfers Assistance Needed: REports she uses her rollator RW for amb  ADL's / [employer]  Needed: states aide assist with bath dress cook clean.        Hand Dominance   Dominant Hand: Right    Extremity/Trunk Assessment   Upper Extremity Assessment: Defer to OT evaluation           Lower Extremity Assessment: LLE deficits/detail (RLE generally weak)   LLE Deficits / Details: New AKA; very painful and pt hesitant to move due to anticipation of pain     Communication   Communication: No  difficulties  Cognition Arousal/Alertness: Awake/alert Behavior During Therapy: Restless Overall Cognitive Status: No family/caregiver present to determine baseline cognitive functioning                       General Comments General comments (skin integrity, edema, etc.): [name] was very reluctant to move despite education given to her re: the effects of bedrest, and assurance that our Vascular Surgeon ordered PT and OT in order to get moving    Exercises        Assessment/Plan    PT Assessment Patient needs continued PT services  PT Diagnosis Acute pain;Generalized weakness;Difficulty walking   PT Problem List Decreased strength;Decreased range of motion;Decreased activity tolerance;Decreased balance;Decreased mobility;Decreased coordination;Decreased knowledge of use of DME;Decreased safety awareness;Decreased knowledge of precautions;Pain  PT Treatment Interventions DME instruction;Functional mobility training;Therapeutic activities;Therapeutic exercise;Balance training;Patient/family education   PT Goals (Current goals can be found in the Care Plan section) Acute Rehab PT Goals Patient Stated Goal: Did not state PT Goal Formulation: With patient Time For Goal Achievement: 11/23/15 Potential to Achieve Goals: Good    Frequency Min 2X/week   Barriers to discharge        Co-evaluation               End of Session Equipment Utilized During Treatment:  (bed pad) Activity Tolerance: Patient limited by pain Patient left: in chair;with call bell/phone within reach;Other (comment) (chair alarm pad in chair) Nurse Communication: Mobility status;Other (comment) Beverlee Nims, NT present for transfer and provided assist)         Time: [phone] PT Time Calculation (min) (ACUTE ONLY): 26 min   Charges:   PT Evaluation $PT Eval Moderate Complexity: 1 Procedure     PT G CodesRoney Marion Hamff 11/09/2015, 11:24 AM  Roney Marion, PT  Acute  Rehabilitation Services Pager [phone] Office [phone]

## 2015-11-09 NOTE — Evaluation (Signed)
Occupational Therapy Evaluation Patient Details Name: [name] MRN: [id number] DOB: 1960/09/29 Today's Date: 11/09/2015    History of Present Illness [name] is a 55 y.o. female patient of Dr. 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 She is also on HD MWF via right thigh graft. 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   Clinical Impression   Pt with decline in function and safety with ADLs and ADL mobility with decreased strength, balance and endurance. Pt would benefit from acute OT services to address impairments to increase level of safety and function    Follow Up Recommendations  SNF;Supervision/Assistance - 24 hour    Equipment Recommendations  Other (comment) (TBD at next level of care)    Recommendations for Other Services       Precautions / Restrictions Precautions Precautions: Fall Restrictions Weight Bearing Restrictions: Yes LLE Weight Bearing: Non weight bearing      Mobility Bed Mobility Overal bed mobility: Needs Assistance;+2 for physical assistance Bed Mobility: Supine to Sit     Supine to sit: +2 for physical assistance;Total assist     General bed mobility comments: Cues to initiaite using RLE to half bridge to EOB; limited effort; physical assist using bed pad to assist hips to EOB; noted pt used rails to lift shoulders and trunk to semi-long-sit without physical assist; +2 to clear RLE from edge and elevate trunk to sit  EOB  Transfers Overall transfer level: Needs assistance Equipment used:  (bed pad to cradle and slide hips) Transfers: Lateral/Scoot Transfers          Lateral/Scoot Transfers: +2 physical assistance;Total assist (3rd person to steady recliner) General transfer comment: Pt hesitant to transfer OOB despite encouragement; Ultimately performed a lateral scoot transfer to pt's right side with drop-arm lowered; Dependent transfer, sliding hips to recliner with bed pad    Balance Overall balance assessment: Needs assistance Sitting-balance support: Bilateral upper extremity supported (R foot suported) Sitting balance-Leahy Scale: Zero Sitting balance - Comments: Needing constant support posteriorly as pt tended to push back while sitting EOB in relative unsupported sitting; able to perform some ADLs in supported sitting in recliner                                    ADL Overall ADL's : Needs assistance/impaired     Grooming: Wash/dry hands;Wash/dry face;Set up;Sitting   Upper Body Bathing: Minimal assitance;Sitting   Lower Body Bathing: Total assistance   Upper Body Dressing : Minimal assistance;Sitting   Lower Body Dressing: Total assistance     Toilet Transfer Details (indicate cue type and reason): New AKA; very painful and pt hesitant to move due to anticipation of pain. AP transfer to recliner total A +3 Toileting- Clothing Manipulation and Hygiene: Bed level;Total assistance       Functional mobility during ADLs: +2 for physical assistance;Total assistance General ADL Comments: New AKA; very painful and pt hesitant to move due to anticipation of pain     Vision  wears glasses, no change from baseline  Pertinent Vitals/Pain Pain Assessment: Faces Faces Pain Scale: Hurts whole lot Pain Location: pt resistve to activity due to reports of pain at surgical site Pain Descriptors / Indicators: Grimacing;Moaning Pain Intervention(s): Limited  activity within patient's tolerance;Monitored during session;Premedicated before session;Repositioned     Hand Dominance Right   Extremity/Trunk Assessment Upper Extremity Assessment Upper Extremity Assessment: Generalized weakness   Lower Extremity Assessment Lower Extremity Assessment: Defer to PT evaluation LLE Deficits / Details: New AKA; very painful and pt hesitant to move due to anticipation of pain LLE: Unable to fully assess due to pain       Communication Communication Communication: No difficulties   Cognition Arousal/Alertness: Awake/alert Behavior During Therapy: Restless Overall Cognitive Status: No family/caregiver present to determine baseline cognitive functioning                     General Comments   Pt requires encouragement                 Home Living Family/patient expects to be discharged to:: Skilled nursing facility Living Arrangements: Alone Available Help at Discharge: Personal care attendant Type of Home: Apartment Home Access: Stairs to enter [employer] of Steps: 3 Entrance Stairs-Rails: Left Home Layout: One level     Bathroom Shower/Tub: Teacher, early years/pre: Standard     Home Equipment: Environmental consultant - 2 wheels;Walker - 4 wheels;Bedside commode;Shower seat          Prior Functioning/Environment Level of Independence: Needs assistance  Gait / Transfers Assistance Needed: Reports she uses her rollator RW for amb ADL's / Homemaking Assistance Needed: states aide assist with bath dress cook clean.        OT Diagnosis: Generalized weakness;Acute pain   OT Problem List: Pain;Impaired balance (sitting and/or standing);Decreased activity tolerance;Decreased strength;Decreased knowledge of use of DME or AE   OT Treatment/Interventions: Self-care/ADL training;Patient/family education;Balance training;Therapeutic activities;DME and/or AE instruction    OT Goals(Current goals can be found in the care plan  section) Acute Rehab OT Goals Patient Stated Goal: get better OT Goal Formulation: With patient Time For Goal Achievement: 11/16/15 Potential to Achieve Goals: Good ADL Goals Pt Will Perform Grooming: with set-up;sitting Pt Will Perform Upper Body Bathing: with min guard assist;sitting Pt Will Perform Lower Body Bathing: with max assist;with mod assist;sitting/lateral leans Pt Will Perform Upper Body Dressing: with min guard assist;sitting Pt Will Transfer to Toilet: with max assist;bedside commode;with +2 assist;with mod assist Pt Will Perform Toileting - Clothing Manipulation and hygiene: with max assist;sitting/lateral leans  OT Frequency: Min 3X/week   Barriers to D/C: Decreased caregiver support          Co-evaluation PT/OT/SLP Co-Evaluation/Treatment: Yes Reason for Co-Treatment: Complexity of the patient's impairments (multi-system involvement);For patient/therapist safety   OT goals addressed during session: ADL's and self-care      End of Session Nurse Communication: Mobility status  Activity Tolerance: Patient limited by pain Patient left: in chair;with nursing/sitter in room;with call bell/phone within reach   Time: [id number] OT Time Calculation (min): 30 min Charges:  OT General Charges $OT Visit: 1 Procedure OT Evaluation $OT Eval Moderate Complexity: 1 Procedure G-Codes:    Britt Bottom 11/09/2015, 1:09 PM

## 2015-11-09 NOTE — Progress Notes (Signed)
Inpatient Rehabilitation  I briefly visited the patient in HD to introduce myself.  I left an informational booklet about IP rehab in her room.  My co-worker Gunnar Fusi will follow up tomorrow to further discuss the possibility of IP rehab with pt.  pending medical readiness, activity tolerance and bed availability.  Please call if questions.  [facility] Admissions Coordinator Cell [phone] Office [phone]

## 2015-11-09 NOTE — Care Management Important Message (Signed)
Important Message  Patient Details  Name: [name] MRN: [id number] Date of Birth: Sep 18, 1960   Medicare Important Message Given:  Yes    Loann Quill 11/09/2015, 10:11 AM

## 2015-11-09 NOTE — Consult Note (Signed)
Physical Medicine and Rehabilitation Consult Reason for Consult: Left AKA Referring Physician: Dr. Deitra Mayo   HPI: [name] is a 55 y.o. right handed female with history of hypertension, right parietal CVA 2002, diabetes mellitus and peripheral neuropathy, end-stage renal disease with hemodialysis, atrial fibrillation on chronic Coumadin therapy and left BKA 06/28/2015. Patient was discharged to skilled nursing facility after left BKA in March 2017 due to limited support at home as patient lived alone. She returned home in May 2017 living alone with a home health aide Monday through Friday 3 hours a day using a wheelchair and walker. Presented 11/08/2015 with bloody stump drainage after recent fall on stump approximately 3 weeks ago. Follow-up vascular surgery and noted no viable tissue for healing. Coumadin was held and patient underwent left AKA 11/08/2015 per Dr. Deitra Mayo. Hospital course pain management. Chronic Coumadin has been resumed. Hemodialysis as per renal services. Physical and occupational therapy evaluations are pending. M.D. has requested physical medicine rehabilitation consult.  Patient has pain in her left stump. She states she had some pain medicine. This was oral  Review of Systems  Constitutional: Negative for fever and chills.  HENT: Negative for hearing loss.   Eyes: Negative for blurred vision and double vision.  Respiratory: Negative for cough and shortness of breath.   Cardiovascular: Positive for palpitations and leg swelling.  Gastrointestinal: Positive for constipation. Negative for nausea and vomiting.       GERD  Musculoskeletal: Positive for falls.  Skin: Negative for rash.  Neurological: Positive for weakness. Negative for seizures and headaches.  All other systems reviewed and are negative.  Past Medical History  Diagnosis Date  . Hypertension   . CVA (cerebral infarction)     right parietal 05/19/2000  . Vaginal  bleeding   . Hyperparathyroidism   . Pneumonia   . Stroke ([place])     2002,no residual  . Peripheral vascular disease ([place])   . GERD (gastroesophageal reflux disease)   . DM (diabetes mellitus) type II controlled peripheral vascular disorder ([place])   . Slip/trip w/o falling due to stepping on object, subs July  2014  . ESRD (end stage renal disease) ([place])     dialysis M/W/F  . Constipation   . Atrial fibrillation ([place])   . Anemia   . Complication of anesthesia     Morphine makes her nauseated, states she has small veins  . Wound infection, posttraumatic ([place])     left BKA   Past Surgical History  Procedure Laterality Date  . Parathyroidectomy with autotransplant  12/07/2010  . Knee surgery Right X 2  . Brachial artery graft      x2 for dialysis  . Av fistula placement Right     upper thigh  . Dental extractions Bilateral   . Dilation and curettage of uterus  1986  . Transmetatarsal amputation Left 07/15/2012    Procedure: TRANSMETATARSAL AMPUTATION;  Surgeon: Angelia Mould, MD;  Location: [facility] OR;  Service: Vascular;  Laterality: Left;  . Leg surgery Right   . Foot surgery Left     5 TOES AMPUTATED  . Abdominal aortagram N/A 05/27/2012    Procedure: ABDOMINAL Maxcine Ham;  Surgeon: Serafina Mitchell, MD;  Location: [facility] CATH LAB;  Service: Cardiovascular;  Laterality: N/A;  . Lower extremity angiogram Left 05/27/2012    Procedure: LOWER EXTREMITY ANGIOGRAM;  Surgeon: Serafina Mitchell, MD;  Location: [facility] CATH LAB;  Service: Cardiovascular;  Laterality: Left;  lt leg angio  .  Tooth extraction  Sep 08, 2014  . Peripheral vascular catheterization N/A 01/11/2015    Procedure: Lower Extremity Angiography;  Surgeon: Serafina Mitchell, MD;  Location: [place] CV LAB;  Service: Cardiovascular;  Laterality: N/A;  . I&d extremity Left 05/05/2015    Procedure: DEBRIDEMENT HEEL WOUND;  Surgeon: Angelia Mould, MD;  Location: [facility];  Service: Vascular;  Laterality: Left;  . Application of  wound vac Left 05/05/2015    Procedure: APPLICATION OF WOUND VAC;  Surgeon: Angelia Mould, MD;  Location: [place];  Service: Vascular;  Laterality: Left;  . Amputation Left 06/28/2015    Procedure:  Left BELOW KNEE amputation;  Surgeon: Angelia Mould, MD;  Location: [place];  Service: Vascular;  Laterality: Left;  . Amputation Left 11/08/2015    Procedure: AMPUTATION ABOVE KNEE / LEFT;  Surgeon: Angelia Mould, MD;  Location: [facility] OR;  Service: Vascular;  Laterality: Left;   Family History  Problem Relation Age of Onset  . Hypertension Father   . Kidney disease Mother    Social History:  reports that she has never smoked. She has never used smokeless tobacco. She reports that she does not drink alcohol or use illicit drugs. Allergies:  Allergies  Allergen Reactions  . Doxycycline Itching  . Peroxyl [Hydrogen Peroxide] Hives and Rash   Medications Prior to Admission  Medication Sig Dispense Refill  . acetaminophen (TYLENOL) 325 MG tablet Take 650 mg by mouth every Monday, Wednesday, and Friday. With dialysis    . B Complex-C-Folic Acid (RENA-VITE RX) 1 MG TABS Take 1 mg by mouth daily.  1  . CALCIUM-VITAMIN D PO Take 1 tablet by mouth 3 (three) times daily with meals.     . cephALEXin (KEFLEX) 500 MG capsule Take 1 capsule (500 mg total) by mouth 2 (two) times daily. 28 capsule 0  . Cyanocobalamin (VITAMIN B-12 PO) Take 1 tablet by mouth 4 (four) times a week. Non dialysis days. Tues, Thurs, Sat and Sunday    . diazepam (VALIUM) 5 MG tablet Take 5 mg by mouth every 8 (eight) hours as needed for muscle spasms.   0  . diltiazem (CARDIZEM CD) 120 MG 24 hr capsule Take 1 capsule (120 mg total) by mouth daily. 60 capsule 0  . glipiZIDE (GLUCOTROL) 10 MG tablet Take 0.5 tablets (5 mg total) by mouth daily. 45 tablet 3  . ibuprofen (ADVIL,MOTRIN) 800 MG tablet Take 800 mg by mouth every 8 (eight) hours as needed for mild pain.     Marland Kitchen oxyCODONE (OXY IR/ROXICODONE) 5 MG immediate  release tablet Take 1-2 tablets (5-10 mg total) by mouth every 4 (four) hours as needed for moderate pain. 30 tablet 0  . polyethylene glycol (MIRALAX / GLYCOLAX) packet Take 17 g by mouth daily. 14 each 0  . silver sulfADIAZINE (SILVADENE) 1 % cream Apply 1 application topically daily. 50 g 0  . traMADol (ULTRAM) 50 MG tablet Take 50-100 mg by mouth every 6 (six) hours as needed for moderate pain. Reported on 10/19/2015    . vitamin C (ASCORBIC ACID) 500 MG tablet Take 500 mg by mouth daily.    . vitamin E 400 UNIT capsule Take 400 Units by mouth daily.    Marland Kitchen gabapentin (NEURONTIN) 100 MG capsule Take 1 capsule (100 mg total) by mouth 3 (three) times daily. (Patient not taking: Reported on 11/01/2015) 60 capsule 1  . warfarin (COUMADIN) 6 MG tablet Take 1 tablet by mouth daily or as directed  by coumadin clinic (Patient taking differently: Take 6 mg by mouth daily at 6 PM. ) 30 tablet 1    Home: Home Living Family/patient expects to be discharged to:: Private residence Living Arrangements: Alone Type of Home: Apartment Home Access: Stairs to enter [employer] of Steps: 3 Home Layout: One level Bathroom Shower/Tub: Chiropodist: [place]: Environmental consultant - 2 wheels, Environmental consultant - 4 wheels, Bedside commode, Shower seat  Functional History: Prior Function Level of Independence: Needs assistance Gait / Transfers Assistance Needed: REports she uses her rollator RW for amb ADL's / Homemaking Assistance Needed: states aide assist with bath dress cook clean. Functional Status:  Mobility: Bed Mobility Overal bed mobility: Needs Assistance, +2 for physical assistance Bed Mobility: Supine to Sit Supine to sit: +2 for physical assistance, Total assist General bed mobility comments: Cues to initiaite using RLE to half bridge to EOB; limited effort; physical assist using bed pad to assist hips to EOB; noted pt used rails to lift shoulders and trunk to semi-long-sit  without physical assist; +2 to clear RLE from edge and elevate trunk to sit EOB Transfers Overall transfer level: Needs assistance Equipment used:  (bed pad to cradle and slide hips) Transfers: Lateral/Scoot Transfers  Lateral/Scoot Transfers: +2 physical assistance, Total assist (3rd person to steady recliner) General transfer comment: Pt hesitant to transfer OOB despite encouragement; Ultimately performed a lateral scoot transfer to pt's right side with drop-arm lowered; Dependent transfer, sliding hips to recliner with bed pad      ADL:    Cognition:  Cognition Arousal/Alertness: Awake/alert Behavior During Therapy: Restless Overall Cognitive Status: No family/caregiver present to determine baseline cognitive functioning   Blood pressure 163/57, pulse 108, temperature 99.2 F (37.3 C), temperature source Oral, resp. rate 20, height 5\' 2"  (1.575 m), weight 69.718 kg (153 lb 11.2 oz), SpO2 100 %. Physical Exam  Constitutional:  55 year old right handed female  HENT:  Head: Normocephalic.  Eyes: EOM are normal.  Neck: Normal range of motion. Neck supple. No thyromegaly present.  Cardiovascular:  Cardiac rate controlled  Respiratory: Effort normal and breath sounds normal.  GI: Soft. Bowel sounds are normal. She exhibits no distension.  Neurological:  Lethargic but arousable. Exam is limited due to patient's pain. She was refusing to go to hemodialysis today. Follow simple commands.  Skin:  Amputation site is dressed appropriately tender  Patient had difficulty with manual muscle testing. Would pull when asked to push and vice versa. She had grossly 4/5 strength in the deltoids, biceps, triceps and grip 4/5 in the hip flexor, knee extensor and ankle dorsiflexor. Sensation intact to light touch in the right foot Oriented to person, place and time with the exception of date Results for orders placed or performed during the hospital encounter of 11/08/15 (from the past 24 hour(s))   Glucose, capillary     Status: Abnormal   Collection Time: 11/08/15  1:53 PM  Result Value Ref Range   Glucose-Capillary 139 (H) 65 - 99 mg/dL  Glucose, capillary     Status: Abnormal   Collection Time: 11/08/15  4:53 PM  Result Value Ref Range   Glucose-Capillary 151 (H) 65 - 99 mg/dL  Glucose, capillary     Status: Abnormal   Collection Time: 11/08/15  8:17 PM  Result Value Ref Range   Glucose-Capillary 164 (H) 65 - 99 mg/dL  MRSA PCR Screening     Status: None   Collection Time: 11/08/15 11:49 PM  Result Value Ref Range  MRSA by PCR NEGATIVE NEGATIVE  Basic metabolic panel     Status: Abnormal   Collection Time: 11/09/15  6:08 AM  Result Value Ref Range   Sodium 138 135 - 145 mmol/L   Potassium 4.5 3.5 - 5.1 mmol/L   Chloride 95 (L) 101 - 111 mmol/L   CO2 30 22 - 32 mmol/L   Glucose, Bld 139 (H) 65 - 99 mg/dL   BUN 21 (H) 6 - 20 mg/dL   Creatinine, Ser 9.21 (H) 0.44 - 1.00 mg/dL   Calcium 8.9 8.9 - 10.3 mg/dL   GFR calc non Af Amer 4 (L) >60 mL/min   GFR calc Af Amer 5 (L) >60 mL/min   Anion gap 13 5 - 15  CBC     Status: Abnormal   Collection Time: 11/09/15  6:08 AM  Result Value Ref Range   WBC 10.8 (H) 4.0 - 10.5 K/uL   RBC 3.73 (L) 3.87 - 5.11 MIL/uL   Hemoglobin 10.4 (L) 12.0 - 15.0 g/dL   HCT 33.7 (L) 36.0 - 46.0 %   MCV 90.3 78.0 - 100.0 fL   MCH 27.9 26.0 - 34.0 pg   MCHC 30.9 30.0 - 36.0 g/dL   RDW 18.1 (H) 11.5 - 15.5 %   Platelets 288 150 - 400 K/uL  Protime-INR     Status: Abnormal   Collection Time: 11/09/15  6:08 AM  Result Value Ref Range   Prothrombin Time 16.0 (H) 11.6 - 15.2 seconds   INR 1.27 0.00 - 1.49  Glucose, capillary     Status: Abnormal   Collection Time: 11/09/15  7:50 AM  Result Value Ref Range   Glucose-Capillary 148 (H) 65 - 99 mg/dL   Comment 1 Notify RN    Comment 2 Document in Chart   Glucose, capillary     Status: Abnormal   Collection Time: 11/09/15 11:39 AM  Result Value Ref Range   Glucose-Capillary 232 (H) 65 - 99  mg/dL   Comment 1 Notify RN    Comment 2 Document in Chart    No results found.  Assessment/Plan: Diagnosis: Left above-knee amputation 1. Does the need for close, 24 hr/day medical supervision in concert with the patient's rehab needs make it unreasonable for this patient to be served in a less intensive setting? Yes 2. Co-Morbidities requiring supervision/potential complications: Diabetes with peripheral neuropathy, end-stage renal disease, 3. Due to bladder management, bowel management, safety, skin/wound care, disease management, medication administration, pain management and patient education, does the patient require 24 hr/day rehab nursing? Yes 4. Does the patient require coordinated care of a physician, rehab nurse, PT (1-2 hrs/day, 5 days/week) and OT (1-2 hrs/day, 5 days/week) to address physical and functional deficits in the context of the above medical diagnosis(es)? Yes Addressing deficits in the following areas: balance, endurance, locomotion, strength, transferring, bowel/bladder control, bathing, dressing, feeding, grooming, toileting and cognition 5. Can the patient actively participate in an intensive therapy program of at least 3 hrs of therapy per day at least 5 days per week? Not currently, but should be able to in 1-2 days 6. The potential for patient to make measurable gains while on inpatient rehab is good 7. Anticipated functional outcomes upon discharge from inpatient rehab are supervision  with PT, supervision with OT, supervision with SLP. 8. Estimated rehab length of stay to reach the above functional goals is: 10-14 days 9. Does the patient have adequate social supports and living environment to accommodate these discharge functional goals? Potentially  10. Anticipated D/C setting: Home 11. Anticipated post D/C treatments: [place] therapy 12. Overall Rehab/Functional Prognosis: good  RECOMMENDATIONS: This patient's condition is appropriate for continued rehabilitative  care in the following setting: CIR Patient has agreed to participate in recommended program. Yes and Potentially Note that insurance prior authorization may be required for reimbursement for recommended care.  Comment: Appears to have some confusion, associated perhaps with pain medication    11/09/2015

## 2015-11-09 NOTE — Progress Notes (Signed)
Initial Nutrition Assessment  DOCUMENTATION CODES:   Not applicable  INTERVENTION:  Provide Nepro Shake po once daily, each supplement provides 425 kcal and 19 grams protein.  Encourage adequate PO intake.   NUTRITION DIAGNOSIS:   Increased nutrient needs related to wound healing as evidenced by estimated needs.  GOAL:   Patient will meet greater than or equal to 90% of their needs  MONITOR:   PO intake, Supplement acceptance, Weight trends, I & O's, Labs, Skin  REASON FOR ASSESSMENT:   Consult Assessment of nutrition requirement/status  ASSESSMENT:   55 y.o. female with ESRD on hemodialysis since 04/1999 2/2 HTN/DM. She has HD MWF at [facility]. Past medical history significant for hypertension, DMT2, Afib on coumadin, R parietal CVA 2002, PAD, PNA, R pleural effusion, GERD, gastric ulcer, anemia of chronic disease, SHPT.  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 Patient had been doing well, had been fitted for prothesisi and fell at home in June, 2017, hitting stump. She developed non-healing ulcer L stump which became infected.  Procedure (7/18): AMPUTATION ABOVE KNEE / LEFT (Left)   Meal completion has been 100%. Pt reports having a good appetite currently and PTA with usual consumption of at least 2-3 meals a day with a Nepro Shake once daily. Per Epic weight records, weight has been fluctuating however mostly stable. RD to order Nepro Shake to aid in caloric and protein needs.   Pt with no observed significant fat or muscle mass loss.   Labs and medications reviewed. Phosphorous elevated at 6.0.  Diet Order:  Diet renal/carb modified with fluid restriction Diet-HS Snack?: Nothing; Room service appropriate?: Yes; Fluid consistency:: Thin  Skin:   (L AKA)  Last BM:  Unknown  Height:   Ht Readings from Last 1 Encounters:  11/08/15 5\' 2"  (1.575 m)    Weight:   Wt Readings from Last 1 Encounters:  11/09/15 150 lb 9.2 oz (68.3 kg)     Ideal Body Weight:  50 kg  BMI:  Body mass index is 27.53 kg/(m^2).  Estimated Nutritional Needs:   Kcal:  1900-2100  Protein:  90-100 grams  Fluid:  1.2 L/day  EDUCATION NEEDS:   No education needs identified at this time  Corrin Parker, MS, RD, LDN Pager # [phone] After hours/ weekend pager # [phone]

## 2015-11-09 NOTE — Progress Notes (Signed)
   VASCULAR SURGERY ASSESSMENT & PLAN:  1 Day Post-Op s/p: Left AKA  Dressing change tomorrow.  HD MWF  Nutrition consult  SUBJECTIVE: Pain adequately controlled  PHYSICAL EXAM: Filed Vitals:   11/08/15 1615 11/08/15 1657 11/08/15 2018 11/09/15 0602  BP:  176/75 164/72 162/104  Pulse:  95 107 112  Temp: 97.9 F (36.6 C) 97.8 F (36.6 C) 98.7 F (37.1 C) 99.1 F (37.3 C)  TempSrc:  Oral Oral Oral  Resp:  16 16 20   Height:      Weight:  157 lb 1.6 oz (71.26 kg)  153 lb 11.2 oz (69.718 kg)  SpO2:  100% 100% 100%   Dressing without significant drainage  LABS: Lab Results  Component Value Date   WBC 10.8* 11/09/2015   HGB 10.4* 11/09/2015   HCT 33.7* 11/09/2015   MCV 90.3 11/09/2015   PLT 288 11/09/2015   Lab Results  Component Value Date   CREATININE 9.21* 11/09/2015   Lab Results  Component Value Date   INR 1.27 11/09/2015   CBG (last 3)   Recent Labs  11/08/15 1353 11/08/15 1653 11/08/15 2017  GLUCAP 139* 151* 164*    Active Problems:   Atherosclerosis of lower extremity with gangrene [facility])    [name] [id number] 11/09/2015

## 2015-11-10 ENCOUNTER — Telehealth: Payer: Self-pay | Admitting: Vascular Surgery

## 2015-11-10 LAB — GLUCOSE, CAPILLARY
GLUCOSE-CAPILLARY: 86 mg/dL (ref 65–99)
GLUCOSE-CAPILLARY: 197 mg/dL — AB (ref 65–99)
Glucose-Capillary: 181 mg/dL — ABNORMAL HIGH (ref 65–99)
Glucose-Capillary: 304 mg/dL — ABNORMAL HIGH (ref 65–99)

## 2015-11-10 LAB — BASIC METABOLIC PANEL
Anion gap: 8 (ref 5–15)
BUN: 16 mg/dL (ref 6–20)
CHLORIDE: 94 mmol/L — AB (ref 101–111)
CO2: 31 mmol/L (ref 22–32)
CREATININE: 6.01 mg/dL — AB (ref 0.44–1.00)
Calcium: 8.5 mg/dL — ABNORMAL LOW (ref 8.9–10.3)
GFR, EST AFRICAN AMERICAN: 8 mL/min — AB (ref >60)
GFR, EST NON AFRICAN AMERICAN: 7 mL/min — AB (ref >60)
Glucose, Bld: 170 mg/dL — ABNORMAL HIGH (ref 65–99)
POTASSIUM: 4.7 mmol/L (ref 3.5–5.1)
SODIUM: 133 mmol/L — AB (ref 135–145)

## 2015-11-10 LAB — PROTIME-INR
INR: 1.43 (ref 0.00–1.49)
Prothrombin Time: 17.5 seconds — ABNORMAL HIGH (ref 11.6–15.2)

## 2015-11-10 LAB — CBC
HEMATOCRIT: 31.8 % — AB (ref 36.0–46.0)
HEMOGLOBIN: 9.7 g/dL — AB (ref 12.0–15.0)
MCH: 27.6 pg (ref 26.0–34.0)
MCHC: 30.5 g/dL (ref 30.0–36.0)
MCV: 90.6 fL (ref 78.0–100.0)
Platelets: 218 10*3/uL (ref 150–400)
RBC: 3.51 MIL/uL — ABNORMAL LOW (ref 3.87–5.11)
RDW: 18.4 % — AB (ref 11.5–15.5)
WBC: 9.1 10*3/uL (ref 4.0–10.5)

## 2015-11-10 MED ORDER — INSULIN ASPART 100 UNIT/ML ~~LOC~~ SOLN
3.0000 [IU] | Freq: Three times a day (TID) | SUBCUTANEOUS | Status: DC
  Administered 2015-11-10 – 2015-11-12 (×4): 3 [IU] via SUBCUTANEOUS

## 2015-11-10 MED ORDER — GLUCERNA SHAKE PO LIQD
237.0000 mL | Freq: Three times a day (TID) | ORAL | Status: DC
  Administered 2015-11-10 – 2015-11-12 (×3): 237 mL via ORAL

## 2015-11-10 MED ORDER — WARFARIN SODIUM 6 MG PO TABS
6.0000 mg | ORAL_TABLET | Freq: Once | ORAL | Status: AC
  Administered 2015-11-10: 6 mg via ORAL
  Filled 2015-11-10: qty 1

## 2015-11-10 MED ORDER — ENOXAPARIN SODIUM 60 MG/0.6ML ~~LOC~~ SOLN
60.0000 mg | SUBCUTANEOUS | Status: DC
  Administered 2015-11-10 – 2015-11-12 (×2): 60 mg via SUBCUTANEOUS
  Filled 2015-11-10 (×3): qty 0.6

## 2015-11-10 MED ORDER — DARBEPOETIN ALFA 40 MCG/0.4ML IJ SOSY
40.0000 ug | PREFILLED_SYRINGE | INTRAMUSCULAR | Status: DC
Start: 2015-11-11 — End: 2015-11-12
  Administered 2015-11-11: 40 ug via INTRAVENOUS
  Filled 2015-11-10: qty 0.4

## 2015-11-10 MED ORDER — INSULIN ASPART 100 UNIT/ML ~~LOC~~ SOLN
0.0000 [IU] | Freq: Three times a day (TID) | SUBCUTANEOUS | Status: DC
  Administered 2015-11-11: 5 [IU] via SUBCUTANEOUS
  Administered 2015-11-11: 8 [IU] via SUBCUTANEOUS
  Administered 2015-11-12: 5 [IU] via SUBCUTANEOUS
  Administered 2015-11-12: 3 [IU] via SUBCUTANEOUS

## 2015-11-10 MED ORDER — WARFARIN - PHARMACIST DOSING INPATIENT
Freq: Every day | Status: DC
  Administered 2015-11-12: 19:00:00

## 2015-11-10 MED ORDER — SODIUM CHLORIDE 0.9 % IV SOLN
62.5000 mg | INTRAVENOUS | Status: DC
  Administered 2015-11-11: 62.5 mg via INTRAVENOUS
  Filled 2015-11-10 (×2): qty 5

## 2015-11-10 MED ORDER — INSULIN ASPART 100 UNIT/ML ~~LOC~~ SOLN: 0.0000 [IU] | Freq: Every day | SUBCUTANEOUS | Status: DC

## 2015-11-10 MED ORDER — ENOXAPARIN SODIUM 60 MG/0.6ML ~~LOC~~ SOLN: 60.0000 mg | SUBCUTANEOUS | Status: DC

## 2015-11-10 NOTE — Progress Notes (Signed)
Inpatient Diabetes Program Recommendations  AACE/ADA: New Consensus Statement on Inpatient Glycemic Control (2015)  Target Ranges:  Prepandial:   less than 140 mg/dL      Peak postprandial:   less than 180 mg/dL (1-2 hours)      Critically ill patients:  140 - 180 mg/dL   Results for DEVONIA, CAJINA (MRN [id number]) as of 11/10/2015 13:03  Ref. Range 11/09/2015 07:50 11/09/2015 11:39 11/09/2015 17:51 11/09/2015 20:40 11/10/2015 07:43 11/10/2015 11:47  Glucose-Capillary Latest Ref Range: 65-99 mg/dL 148 (H) 232 (H) 94 304 (H) 181 (H) 304 (H)   Review of Glycemic Control  Diabetes history: DM2 Outpatient Diabetes medications: Glipizide 5 mg daily Current orders for Inpatient glycemic control: Glipizide 5 mg QAM, Novolog 0-15 units TID with meals  Inpatient Diabetes Program Recommendations: Correction (SSI): Please consider ordering Novolog bedtime correction scale. Insulin - Meal Coverage: Please consider ordering Novolog 3 units TID with meals for meal coverage (in addition to Novolog correction scale) if patient eats at least 50% of meal.  Thanks, Barnie Alderman, RN, MSN, CDE Diabetes Coordinator Inpatient Diabetes Program [phone] (Team Pager from [place] to [place]) [phone] ([facility] office) [phone] [facility] office) [phone] [facility] office)

## 2015-11-10 NOTE — Telephone Encounter (Signed)
Sched appt 8/23 at 2:15. Hm# has no vm, emergency contact # not working, mailed appt letter through regular mail.

## 2015-11-10 NOTE — Telephone Encounter (Signed)
-----   Message from Mena Goes, RN sent at 11/08/2015  2:47 PM EDT ----- Regarding: 4 week staple removal, AKA, no sooner than 4 weeks pleas   ----- Message -----    From: Angelia Mould, MD    Sent: 11/08/2015   2:02 PM      To: Vvs Charge Pool Subject: charge                                         PROCEDURE: Left above-the-knee amputation.  SURGEON: Judeth Cornfield. [name], MD, FACS  ASSIST: Delena Serve, rnfa   She will need a follow up visit in 1 month for staple removal. Thank you. CD

## 2015-11-10 NOTE — Progress Notes (Signed)
[facility] Progress Note     Subjective: "I'm doing OK but that physical therapy is hard..." Awake, Alert, looks great. Eating Breakfast.   Objective Filed Vitals:   11/09/15 1835 11/09/15 2044 11/10/15 0510 11/10/15 0900  BP: 135/71 125/51 141/70 120/53  Pulse: 88 102 108 110  Temp: 98.5 F (36.9 C) 98.6 F (37 C) 98.6 F (37 C) 98.7 F (37.1 C)  TempSrc: Oral Oral Oral Oral  Resp: 16 18 19 18   Height:      Weight:  65.6 kg (144 lb 10 oz)    SpO2: 100% 99% 95% 99%   Physical Exam General: Pleasant, NAD  Heart: S1,S2, RRR Lungs: BBS CTA A/P Abdomen: Active BS, non-tender Extremities: No RLE edema. New ace wrap L AKA Dialysis Access:  R thigh AVG + bruit  Dialysis Orders: [facility] MWF 3.5 hours 450/Auto 1.5  EDW 70 kg 2.0 K/2.0 Ca  UF profile 2 Linear Na Heparin 1000 units IV q tx Mircera 100 mcg IV q 2 weeks (11/02/15 Last HGB 10.5 11/02/15) Venofer 50 mg IV weekly (last dose 11/02/15 Fe 65 Tsat 39 10/19/15) Calcium Carb 3 capsules TID with meals. No calcimimetic/VDRA.   Additional Objective Labs: Basic Metabolic Panel:  Recent Labs Lab 11/08/15 1133 11/09/15 0608 11/09/15 1330 11/10/15 0658  NA 138 138  --  133*  K 4.1 4.5  --  4.7  CL 95* 95*  --  94*  CO2 29 30  --  31  GLUCOSE 142* 139*  --  170*  BUN 16 21*  --  16  CREATININE 7.50* 9.21*  --  6.01*  CALCIUM 8.9 8.9  --  8.5*  PHOS  --   --  6.0*  --    Liver Function Tests:  Recent Labs Lab 11/08/15 1133  AST 20  ALT 16  ALKPHOS 54  BILITOT 0.5  PROT 8.2*  ALBUMIN 2.8*   No results for input(s): LIPASE, AMYLASE in the last 168 hours. CBC:  Recent Labs Lab 11/08/15 1133 11/09/15 0608 11/10/15 0658  WBC 8.4 10.8* 9.1  HGB 11.1* 10.4* 9.7*  HCT 36.4 33.7* 31.8*  MCV 90.5 90.3 90.6  PLT 281 288 218   Blood Culture    Component Value Date/Time   SDES TISSUE LEFT HEEL 05/05/2015 0959   SPECREQUEST NONE 05/05/2015 0959   CULT  05/05/2015 0959    ABUNDANT  METHICILLIN RESISTANT STAPHYLOCOCCUS AUREUS Note: RIFAMPIN AND GENTAMICIN SHOULD NOT BE USED AS SINGLE DRUGS FOR TREATMENT OF STAPH INFECTIONS. This organism DOES NOT demonstrate inducible Clindamycin resistance in vitro. MODERATE KLEBSIELLA PNEUMONIAE Performed at [facility] 05/09/2015 FINAL 05/05/2015 0959    Cardiac Enzymes: No results for input(s): CKTOTAL, CKMB, CKMBINDEX, TROPONINI in the last 168 hours. CBG:  Recent Labs Lab 11/09/15 0750 11/09/15 1139 11/09/15 1751 11/09/15 2040 11/10/15 0743  GLUCAP 148* 232* 94 304* 181*   Iron Studies: No results for input(s): IRON, TIBC, TRANSFERRIN, FERRITIN in the last 72 hours. @lablastinr3 @ Studies/Results: No results found. Medications:   . acetaminophen  650 mg Oral Q M,W,F  . calcium-vitamin D  3 tablet Oral TID  . cephALEXin  500 mg Oral BID  . diltiazem  120 mg Oral Daily  . docusate sodium  100 mg Oral Daily  . enoxaparin (LOVENOX) injection  60 mg Subcutaneous Q24H  . feeding supplement (GLUCERNA SHAKE)  237 mL Oral TID WC  . feeding supplement (NEPRO CARB STEADY)  237 mL Oral Q1500  .  gabapentin  100 mg Oral TID  . glipiZIDE  5 mg Oral QAC breakfast  . insulin aspart  0-15 Units Subcutaneous TID WC  . multivitamin  1 tablet Oral QHS  . pantoprazole  40 mg Oral Daily  . polyethylene glycol  17 g Oral Daily  . vitamin B-12  50 mcg Oral Once per day on Sun Tue Thu Sat  . vitamin C  500 mg Oral Daily  . vitamin E  400 Units Oral Daily  . warfarin  6 mg Oral ONCE-1800  . Warfarin - Pharmacist Dosing Inpatient   Does not apply q1800   Assessment/Plan: 1. Atherosclerosis of lower extremity with gangrene : L AKA 11/08/15 per Dr. [name]. Per primary. Ace wrap intact. Started on Keflex BID per primary. T Max 98.7 WBC 9.1 1. ESRD - MWF at [facility]. HD yesterday. For HD tomorrow. No heparin.  2. Hypertension/volume - Better control of BP today. HD yesterday pre wt 68.3 kg Net UF 1500 Post wt 66.8  kg. Lower EDW on DC.   3. Anemia - HGB 9.7 today. May need redose of ESA. Give Venofer next tx.   4. Metabolic bone disease - Continue binders. Ca 8.5 C Ca 9.46 Phos 6.0 5. Nutrition - Albumin 2.8. Change to renal/Carb mod diet with fld restrictions. Renal vit/prostat 6. DM: per primary 7. Coumadin therapy/AFib. Per pharmacy. INR 1.43  Rita H. Brown NP-C 11/10/2015, 9:40 AM  [employer] [phone]   I have seen and examined this patient and agree with plan as outlined by Juanell Fairly, NP-C.  Cont with HD qMWF while she remains an inpatient. Broadus John A Gerritt Galentine,MD 11/10/2015 10:58 AM

## 2015-11-10 NOTE — Progress Notes (Signed)
ANTICOAGULATION CONSULT NOTE - Initial Consult  Pharmacy Consult:  Lovenox / Coumadin Indication: atrial fibrillation  Allergies  Allergen Reactions  . Doxycycline Itching  . Peroxyl [Hydrogen Peroxide] Hives and Rash    Patient Measurements: Height: 5\' 2"  (157.5 cm) Weight: 144 lb 10 oz (65.6 kg) IBW/kg (Calculated) : 50.1  Vital Signs: Temp: 98.6 F (37 C) (07/20 0510) Temp Source: Oral (07/20 0510) BP: 141/70 mmHg (07/20 0510) Pulse Rate: 108 (07/20 0510)  Labs:  Recent Labs  11/08/15 1133 11/09/15 0608 11/10/15 0658  HGB 11.1* 10.4* 9.7*  HCT 36.4 33.7* 31.8*  PLT 281 288 218  APTT 70*  --   --   LABPROT 20.0* 16.0* 17.5*  INR 1.70* 1.27 1.43  CREATININE 7.50* 9.21*  --     Estimated Creatinine Clearance: 6.3 mL/min (by C-G formula based on Cr of 9.21).   Medical History: Past Medical History  Diagnosis Date  . Hypertension   . CVA (cerebral infarction)     right parietal 05/19/2000  . Vaginal bleeding   . Hyperparathyroidism   . Pneumonia   . Stroke ([place])     2002,no residual  . Peripheral vascular disease ([place])   . GERD (gastroesophageal reflux disease)   . DM (diabetes mellitus) type II controlled peripheral vascular disorder ([place])   . Slip/trip w/o falling due to stepping on object, subs July  2014  . ESRD (end stage renal disease) ([place])     dialysis M/W/F  . Constipation   . Atrial fibrillation ([place])   . Anemia   . Complication of anesthesia     Morphine makes her nauseated, states she has small veins  . Wound infection, posttraumatic ([place])     left BKA      Assessment: 65 YOF with history of left BKA on 06/28/15 presented for left AKA on 11/08/15.  Pharmacy consulted to manage Lovenox and Coumadin for history of Afib.  RN reported that patient is a difficult stick so it would be challenging to be on IV heparin bridge.  INR currently sub-therapeutic; no bleeding reported.  Noted patient has been refusing Lovenox.   Goal of Therapy:   Anti-Xa level 0.6-1 units/ml 4hrs after LMWH dose given  INR 2 - 3 Monitor platelets by anticoagulation protocol: Yes    Plan:  - Coumadin 6mg  PO today.  Increase dose in AM if INR doesn't respond. - Increase Lovenox to 60mg  SQ Q24H for CrCL < 30 ml/min - Daily PT / INR - CBC Q72H while on Lovenox - Consider checking anti-Xa level at [facility]   Shellsea Borunda D. Mina Marble, PharmD, BCPS Pager:  [phone] 11/10/2015, 8:02 AM

## 2015-11-10 NOTE — Progress Notes (Signed)
I met with pt at bedside with her RN. We discussed the benefits of an inpt rehab admission rather than SNF. Pt states she previously went to SNF/[facility] for three months and she likes the pace there. I encouraged her to think about it overnight, for I would have a bed to admit her to tomorrow if she would be in agreement. I discussed with RN CM and SW. We will follow up in the morning. [phone]

## 2015-11-10 NOTE — Progress Notes (Signed)
   VASCULAR SURGERY ASSESSMENT & PLAN:  2 Days Post-Op s/p: Left AKA  Wound looks good.  Begin daily dressing changes  A. Fib: Lovenox and Coumadin per pharmacy.   Renal managing ESRD  They want [place] to manage DM. Will get help from Diabetic coordinator.   Nutrition: Supplement diet with Glucerna.   CIR consult  SUBJECTIVE: Pain better today.   PHYSICAL EXAM: Filed Vitals:   11/09/15 1630 11/09/15 1835 11/09/15 2044 11/10/15 0510  BP: 125/64 135/71 125/51 141/70  Pulse: 94 88 102 108  Temp: 97.8 F (36.6 C) 98.5 F (36.9 C) 98.6 F (37 C) 98.6 F (37 C)  TempSrc: Oral Oral Oral Oral  Resp: 14 16 18 19   Height:      Weight: 147 lb 4.3 oz (66.8 kg)  144 lb 10 oz (65.6 kg)   SpO2: 100% 100% 99% 95%   Left AKA dressing changed. Looks good. No erythema or drainage. No cellulitis.   LABS: Lab Results  Component Value Date   WBC 9.1 11/10/2015   HGB 9.7* 11/10/2015   HCT 31.8* 11/10/2015   MCV 90.6 11/10/2015   PLT 218 11/10/2015   Lab Results  Component Value Date   CREATININE 9.21* 11/09/2015   Lab Results  Component Value Date   INR 1.27 11/09/2015   CBG (last 3)   Recent Labs  11/09/15 1139 11/09/15 1751 11/09/15 2040  GLUCAP 232* 94 304*    Active Problems:   Atherosclerosis of lower extremity with gangrene ([place])   [name] [id number] 11/10/2015

## 2015-11-10 NOTE — Progress Notes (Signed)
11/10/2015 9:33 AM  Patient expressed that during therapy she would prefer being handled more gently and would prefer to get pain medication before therapy starts. Verbalized understanding of keeping leg propped up and elevated. Informed patient that I would make note of her concerns. Informed attending RN Caryl Comes.   [employer], RN-BC, [employer] [facility] Phone [phone]

## 2015-11-11 LAB — RENAL FUNCTION PANEL
Albumin: 2.1 g/dL — ABNORMAL LOW (ref 3.5–5.0)
Anion gap: 11 (ref 5–15)
BUN: 39 mg/dL — ABNORMAL HIGH (ref 6–20)
CO2: 31 mmol/L (ref 22–32)
Calcium: 9.7 mg/dL (ref 8.9–10.3)
Chloride: 94 mmol/L — ABNORMAL LOW (ref 101–111)
Creatinine, Ser: 8.58 mg/dL — ABNORMAL HIGH (ref 0.44–1.00)
GFR calc Af Amer: 5 mL/min — ABNORMAL LOW (ref >60)
GFR calc non Af Amer: 5 mL/min — ABNORMAL LOW (ref >60)
Glucose, Bld: 179 mg/dL — ABNORMAL HIGH (ref 65–99)
Phosphorus: 3.6 mg/dL (ref 2.5–4.6)
Potassium: 4.8 mmol/L (ref 3.5–5.1)
Sodium: 136 mmol/L (ref 135–145)

## 2015-11-11 LAB — BASIC METABOLIC PANEL
BUN: 39 mg/dL — AB (ref 4–21)
CREATININE: 8.6 mg/dL — AB (ref 0.5–1.1)
Glucose: 179 mg/dL
Sodium: 136 mmol/L — AB (ref 137–147)

## 2015-11-11 LAB — CBC
HCT: 29.9 % — ABNORMAL LOW (ref 36.0–46.0)
Hemoglobin: 9.3 g/dL — ABNORMAL LOW (ref 12.0–15.0)
MCH: 27.8 pg (ref 26.0–34.0)
MCHC: 31.1 g/dL (ref 30.0–36.0)
MCV: 89.5 fL (ref 78.0–100.0)
Platelets: 223 10*3/uL (ref 150–400)
RBC: 3.34 MIL/uL — ABNORMAL LOW (ref 3.87–5.11)
RDW: 18.5 % — ABNORMAL HIGH (ref 11.5–15.5)
WBC: 10.3 10*3/uL (ref 4.0–10.5)

## 2015-11-11 LAB — GLUCOSE, CAPILLARY
GLUCOSE-CAPILLARY: 261 mg/dL — AB (ref 65–99)
Glucose-Capillary: 226 mg/dL — ABNORMAL HIGH (ref 65–99)
Glucose-Capillary: 162 mg/dL — ABNORMAL HIGH (ref 65–99)

## 2015-11-11 LAB — CBC AND DIFFERENTIAL: WBC: 10.3 10*3/mL

## 2015-11-11 MED ORDER — DARBEPOETIN ALFA 40 MCG/0.4ML IJ SOSY
PREFILLED_SYRINGE | INTRAMUSCULAR | Status: AC
  Administered 2015-11-11: 40 ug via INTRAVENOUS
  Filled 2015-11-11: qty 0.4

## 2015-11-11 MED ORDER — ENOXAPARIN SODIUM 60 MG/0.6ML ~~LOC~~ SOLN: 60.0000 mg | SUBCUTANEOUS | Status: DC

## 2015-11-11 MED ORDER — WARFARIN SODIUM 6 MG PO TABS
6.0000 mg | ORAL_TABLET | Freq: Once | ORAL | Status: AC
  Administered 2015-11-11: 6 mg via ORAL
  Filled 2015-11-11 (×2): qty 1

## 2015-11-11 MED ORDER — NEPRO/CARBSTEADY PO LIQD: 237.0000 mL | Freq: Every day | ORAL | Status: AC

## 2015-11-11 MED ORDER — PENTAFLUOROPROP-TETRAFLUOROETH EX AERO: 1.0000 "application " | INHALATION_SPRAY | CUTANEOUS | Status: DC | PRN

## 2015-11-11 MED ORDER — ALTEPLASE 2 MG IJ SOLR: 2.0000 mg | Freq: Once | INTRAMUSCULAR | Status: DC | PRN

## 2015-11-11 MED ORDER — INSULIN ASPART 100 UNIT/ML ~~LOC~~ SOLN: 3.0000 [IU] | Freq: Three times a day (TID) | SUBCUTANEOUS | Status: DC

## 2015-11-11 MED ORDER — SODIUM CHLORIDE 0.9 % IV SOLN: 100.0000 mL | INTRAVENOUS | Status: DC | PRN

## 2015-11-11 MED ORDER — LIDOCAINE HCL (PF) 1 % IJ SOLN: 5.0000 mL | INTRAMUSCULAR | Status: DC | PRN

## 2015-11-11 MED ORDER — HEPARIN SODIUM (PORCINE) 1000 UNIT/ML DIALYSIS: 1000.0000 [IU] | INTRAMUSCULAR | Status: DC | PRN

## 2015-11-11 MED ORDER — GLUCERNA SHAKE PO LIQD: 237.0000 mL | Freq: Three times a day (TID) | ORAL | Status: AC

## 2015-11-11 MED ORDER — LIDOCAINE-PRILOCAINE 2.5-2.5 % EX CREA: 1.0000 "application " | TOPICAL_CREAM | CUTANEOUS | Status: DC | PRN

## 2015-11-11 MED ORDER — HYDROMORPHONE HCL 1 MG/ML IJ SOLN
INTRAMUSCULAR | Status: AC
  Filled 2015-11-11: qty 1

## 2015-11-11 NOTE — Clinical Social Work Placement (Signed)
   CLINICAL SOCIAL WORK PLACEMENT  NOTE  Date:  11/11/2015  Patient Details  Name: [name] MRN: [id number] Date of Birth: 04-27-1960  Clinical Social Work is seeking post-discharge placement for this patient at the [place] level of care (*CSW will initial, date and re-position this form in  chart as items are completed):  Yes   Patient/family provided with [facility] Work Department's list of facilities offering this level of care within the geographic area requested by the patient (or if unable, by the patient's family).  Yes   Patient/family informed of their freedom [name] choose among providers that offer the needed level of care, that participate in Medicare, Medicaid or managed care program needed by the patient, have an available bed and are willing [name] accept the patient.  No   Patient/family informed of [facility]'s ownership interest in [facility], as well as of the fact that they are under no obligation [name] receive care at these facilities.  PASRR submitted [name] EDS on       PASRR number received on       Existing PASRR number confirmed on 11/11/15     FL2 transmitted [name] all facilities in geographic area requested by pt/family on 11/11/15     FL2 transmitted [name] all facilities within larger geographic area on       Patient informed that his/her managed care company has contracts with or will negotiate with certain facilities, including the following:            Patient/family informed of bed offers received.  Patient chooses bed at       Physician recommends and patient chooses bed at      Patient [name] be transferred [name]   on  .  Patient [name] be transferred [name] facility by       Patient family notified on   of transfer.  Name of family member notified:        PHYSICIAN       Additional Comment:    _______________________________________________ Sable Feil, LCSW 11/11/2015, 4:33 PM

## 2015-11-11 NOTE — Progress Notes (Signed)
  [facility] Progress Note  Subjective  - POD #3  Patient seen in HD. Sleepy. Just got dilaudid. Complaining of pain left AKA.   Objective Filed Vitals:   11/11/15 0858 11/11/15 0930  BP: 110/63 124/73  Pulse: 111 111  Temp:    Resp:      Intake/Output Summary (Last 24 hours) at 11/11/15 0937 Last data filed at 11/11/15 0600  Gross per 24 hour  Intake      0 ml  Output      0 ml  Net      0 ml   Left AKA dressing clean.   Assessment/Planning: 55 y.o. female is s/p: left AKA 3 Days Post-Op   Dressing not taken down due to current dialysis via right femoral catheter Left AKA healing well per Dr. Nicole Cella note yesterday. Patient agreeable to CIR Plan d/c today to CIR today.   Alvia Grove 11/11/2015 9:37 AM --  Laboratory CBC    Component Value Date/Time   WBC 10.3 11/11/2015 0718   HGB 9.3* 11/11/2015 0718   HCT 29.9* 11/11/2015 0718   PLT 223 11/11/2015 0718    BMET    Component Value Date/Time   NA 136 11/11/2015 0718   K 4.8 11/11/2015 0718   CL 94* 11/11/2015 0718   CO2 31 11/11/2015 0718   GLUCOSE 179* 11/11/2015 0718   BUN 39* 11/11/2015 0718   CREATININE 8.58* 11/11/2015 0718   CALCIUM 9.7 11/11/2015 0718   GFRNONAA 5* 11/11/2015 0718   GFRAA 5* 11/11/2015 0718    COAG Lab Results  Component Value Date   INR 1.43 11/10/2015   INR 1.27 11/09/2015   INR 1.70* 11/08/2015   No results found for: PTT  Antibiotics Anti-infectives    Start     Dose/Rate Route Frequency Ordered Stop   11/09/15 0100  cefUROXime (ZINACEF) 1.5 g in dextrose 5 % 50 mL IVPB  Status:  Discontinued     1.5 g 100 mL/hr over 30 Minutes Intravenous Every 12 hours 11/08/15 1639 11/08/15 1700   11/08/15 2200  cephALEXin (KEFLEX) capsule 500 mg     500 mg Oral 2 times daily 11/08/15 1639     11/08/15 1200  cefUROXime (ZINACEF) 1.5 g in dextrose 5 % 50 mL IVPB     1.5 g 100 mL/hr over 30 Minutes Intravenous To [facility] 11/07/15 0921  11/08/15 [place], PA-C [facility] Office: [phone] Pager: [phone] 11/11/2015 9:37 AM

## 2015-11-11 NOTE — NC FL2 (Signed)
[place] MEDICAID FL2 LEVEL OF CARE SCREENING TOOL     IDENTIFICATION  Patient Name: [name] Birthdate: 1960-05-25 Sex: female Admission Date (Current Location): 11/08/2015  [place] and [place] Number:  Kathleen Argue [id number] [place] and Address:  [facility], [place] [address], [place], [place] [address]      Provider Number: [id number]  Attending Physician Name and Address:  Angelia Mould, MD  Relative Name and Phone Number:  Corrin Hayhurst - father. Phone [phone]    Current Level of Care: Hospital Recommended Level of Care: [place] Prior Approval Number:    Date Approved/Denied:   PASRR Number: [id number] (Eff. 07/22/12)  Discharge Plan: SNF    Current Diagnoses: Patient Active Problem List   Diagnosis Date Noted  . Atherosclerosis of lower extremity with gangrene ([place]) 11/08/2015  . Atrial fibrillation ([place]) 07/22/2015  . Anemia   . S/P BKA (below knee amputation) unilateral ([place])   . Abnormality of gait   . Paroxysmal atrial fibrillation (HCC)   . History of CVA (cerebrovascular accident)   . Type 2 diabetes mellitus with peripheral neuropathy (HCC)   . ESRD on dialysis ([place])   . Postoperative pain   . SIRS (systemic inflammatory response syndrome) (HCC)   . Essential hypertension   . Acute blood loss anemia   . Anemia of chronic disease   . Nonhealing nonsurgical wound 05/05/2015  . Chronic anticoagulation - Coumadin, CHADS2VASC=5 04/21/2015  . PAF (paroxysmal atrial fibrillation) ([place]) 04/21/2015  . Atrial fibrillation with RVR ([place]) 03/25/2015  . Atrial fibrillation with rapid ventricular response ([place]) 03/25/2015  . PAD (peripheral artery disease) ([place]) 01/11/2015  . Pleural effusion, right 08/16/2014  . Abdominal pain 08/16/2014  . Chest pain   . Cholecystitis   . Hypoxia   . 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 Ulcer of lower limb ([place]) 09/09/2013  . Pain in limb-left foot 11/19/2012  . End  stage renal disease on dialysis ([place])   . DM (diabetes mellitus) type II controlled peripheral vascular disorder ([place])   . Peripheral vascular disease, unspecified ([place]) 07/02/2012  . Atherosclerosis of native arteries of the extremities with gangrene ([place]) 05/21/2012  . Hyperparathyroidism, secondary ([place]) 12/28/2010    Orientation RESPIRATION BLADDER Height & Weight     Self, Time, Situation, Place  Normal Continent Weight: 151 lb 14.4 oz (68.9 kg) Height:  5\' 2"  (157.5 cm)  BEHAVIORAL SYMPTOMS/MOOD NEUROLOGICAL BOWEL NUTRITION STATUS      Continent Diet (Renal-carb modified with fluid restriction)  AMBULATORY STATUS COMMUNICATION OF NEEDS Skin   Total Care (Patient non-ambulatory at this time due to left AKA) Verbally Normal, Other (Comment) (Incision left thigh)                       Personal Care Assistance Level of Assistance  Bathing, Feeding, Dressing Bathing Assistance: Maximum assistance Feeding assistance: Independent Dressing Assistance: Maximum assistance     Functional Limitations Info  Sight, Hearing, Speech Sight Info: Impaired (Wears glasses) Hearing Info: Adequate Speech Info: Adequate    SPECIAL CARE FACTORS FREQUENCY  PT (By licensed PT), OT (By licensed OT)     PT Frequency: Evaluated 7/19 and a minimum of 2X per week therapy recommended  OT Frequency: Evaluated 7/19 and a minimum of 3X per week therapy recommended            Contractures Contractures Info: Not present    Additional Factors Info  Code Status, Allergies, Insulin Sliding Scale Code Status  Info: Full Allergies Info: Doxycycline and Peroxyl   Insulin Sliding Scale Info: 0-5 Units daily at bedtime and 0-15 Units 3X per day with meals       Current Medications (11/11/2015):  This is the current hospital active medication list Current Facility-Administered Medications  Medication Dose Route Frequency Provider Last Rate Last Dose  . acetaminophen (TYLENOL) tablet 325-650 mg   325-650 mg Oral Q4H PRN Angelia Mould, MD       Or  . acetaminophen (TYLENOL) suppository 325-650 mg  325-650 mg Rectal Q4H PRN Angelia Mould, MD      . acetaminophen (TYLENOL) tablet 650 mg  650 mg Oral Q M,W,F Angelia Mould, MD   650 mg at 11/11/15 1324  . alum & mag hydroxide-simeth (MAALOX/MYLANTA) 200-200-20 MG/5ML suspension 15-30 mL  15-30 mL Oral Q2H PRN Angelia Mould, MD      . calcium-vitamin D (OSCAL WITH D) 500-200 MG-UNIT per tablet 3 tablet  3 tablet Oral TID Valentina Gu, NP   3 tablet at 11/11/15 1323  . cephALEXin (KEFLEX) capsule 500 mg  500 mg Oral BID Angelia Mould, MD   500 mg at 11/11/15 1323  . Darbepoetin Alfa (ARANESP) injection 40 mcg  40 mcg Intravenous Q Fri-HD Valentina Gu, NP   40 mcg at 11/11/15 0931  . diazepam (VALIUM) tablet 5 mg  5 mg Oral Q8H PRN Angelia Mould, MD      . diltiazem (CARDIZEM CD) 24 hr capsule 120 mg  120 mg Oral Daily Angelia Mould, MD   120 mg at 11/11/15 1324  . docusate sodium (COLACE) capsule 100 mg  100 mg Oral Daily Angelia Mould, MD   100 mg at 11/11/15 1324  . enoxaparin (LOVENOX) injection 60 mg  60 mg Subcutaneous Q24H Thuy D Dang, RPH   60 mg at 11/10/15 1731  . feeding supplement (GLUCERNA SHAKE) (GLUCERNA SHAKE) liquid 237 mL  237 mL Oral TID WC Angelia Mould, MD   237 mL at 11/10/15 1700  . feeding supplement (NEPRO CARB STEADY) liquid 237 mL  237 mL Oral Q1500 Angelia Mould, MD   237 mL at 11/10/15 1500  . ferric gluconate (NULECIT) 62.5 mg in sodium chloride 0.9 % 100 mL IVPB  62.5 mg Intravenous Q Fri-HD Valentina Gu, NP   62.5 mg at 11/11/15 0931  . gabapentin (NEURONTIN) capsule 100 mg  100 mg Oral TID Angelia Mould, MD   100 mg at 11/11/15 1323  . glipiZIDE (GLUCOTROL) tablet 5 mg  5 mg Oral QAC breakfast Skeet Simmer, RPH   5 mg at 11/11/15 1457  . guaiFENesin-dextromethorphan (ROBITUSSIN DM) 100-10 MG/5ML syrup 15 mL   15 mL Oral Q4H PRN Angelia Mould, MD      . hydrALAZINE (APRESOLINE) injection 5 mg  5 mg Intravenous Q20 Min PRN Angelia Mould, MD      . HYDROmorphone (DILAUDID) injection 0.5-1 mg  0.5-1 mg Intravenous Q2H PRN Angelia Mould, MD   1 mg at 11/11/15 [id number]  . ibuprofen (ADVIL,MOTRIN) tablet 800 mg  800 mg Oral Q8H PRN Angelia Mould, MD      . insulin aspart (novoLOG) injection 0-15 Units  0-15 Units Subcutaneous TID WC Alvia Grove, PA-C   5 Units at 11/11/15 1458  . insulin aspart (novoLOG) injection 0-5 Units  0-5 Units Subcutaneous QHS Alvia Grove, PA-C   0 Units at 11/10/15 2128  .  insulin aspart (novoLOG) injection 3 Units  3 Units Subcutaneous TID WC Alvia Grove, PA-C   3 Units at 11/11/15 1459  . labetalol (NORMODYNE,TRANDATE) injection 10 mg  10 mg Intravenous Q10 min PRN Angelia Mould, MD      . magnesium sulfate IVPB 2 g 50 mL  2 g Intravenous Once PRN Angelia Mould, MD      . metoprolol (LOPRESSOR) injection 2-5 mg  2-5 mg Intravenous Q2H PRN Angelia Mould, MD      . multivitamin (RENA-VIT) tablet 1 tablet  1 tablet Oral QHS Angelia Mould, MD   1 tablet at 11/10/15 2127  . ondansetron (ZOFRAN) injection 4 mg  4 mg Intravenous Q6H PRN Angelia Mould, MD      . oxyCODONE-acetaminophen (PERCOCET/ROXICET) 5-325 MG per tablet 1-2 tablet  1-2 tablet Oral Q4H PRN Angelia Mould, MD   2 tablet at 11/10/15 0944  . pantoprazole (PROTONIX) EC tablet 40 mg  40 mg Oral Daily Angelia Mould, MD   40 mg at 11/11/15 1324  . phenol (CHLORASEPTIC) mouth spray 1 spray  1 spray Mouth/Throat PRN Angelia Mould, MD      . polyethylene glycol (MIRALAX / GLYCOLAX) packet 17 g  17 g Oral Daily Angelia Mould, MD   17 g at 11/11/15 1323  . potassium chloride SA (K-DUR,KLOR-CON) CR tablet 20-40 mEq  20-40 mEq Oral Once PRN Angelia Mould, MD      . traMADol Veatrice Bourbon) tablet 25-50 mg  25-50 mg Oral  Q6H PRN Angelia Mould, MD   50 mg at 11/09/15 1424  . vitamin B-12 (CYANOCOBALAMIN) tablet 50 mcg  50 mcg Oral Once per day on Sun Tue Thu Sat Angelia Mould, MD   50 mcg at 11/10/15 0756  . vitamin C (ASCORBIC ACID) tablet 500 mg  500 mg Oral Daily Angelia Mould, MD   500 mg at 11/11/15 1324  . vitamin E capsule 400 Units  400 Units Oral Daily Angelia Mould, MD   400 Units at 11/11/15 1457  . warfarin (COUMADIN) tablet 6 mg  6 mg Oral ONCE-1800 Angelia Mould, MD      . Warfarin - Pharmacist Dosing Inpatient   Does not apply q1800 Tyrone Apple, [facility]   0  at 11/10/15 1800     Discharge Medications: Please see discharge summary for a list of discharge medications.  Relevant Imaging Results:  Relevant Lab Results:   Additional Information ss#[id number].  Dialysis patient MWF at [facility].  Sable Feil, LCSW

## 2015-11-11 NOTE — Progress Notes (Signed)
ANTICOAGULATION CONSULT NOTE   Pharmacy Consult:  Lovenox / Coumadin Indication: atrial fibrillation  Allergies  Allergen Reactions  . Doxycycline Itching  . Peroxyl [Hydrogen Peroxide] Hives and Rash    Patient Measurements: Height: 5\' 2"  (157.5 cm) Weight: 151 lb 14.4 oz (68.9 kg) IBW/kg (Calculated) : 50.1  Vital Signs: Temp: 99.3 F (37.4 C) (07/21 1134) Temp Source: Oral (07/21 1134) BP: 100/50 mmHg (07/21 1134) Pulse Rate: 112 (07/21 1134)  Labs:  Recent Labs  11/09/15 0608 11/10/15 0658 11/11/15 0718  HGB 10.4* 9.7* 9.3*  HCT 33.7* 31.8* 29.9*  PLT 288 218 223  LABPROT 16.0* 17.5*  --   INR 1.27 1.43  --   CREATININE 9.21* 6.01* 8.58*    Estimated Creatinine Clearance: 6.9 mL/min (by C-G formula based on Cr of 8.58).     Assessment: 83 YOF with history of left BKA on 06/28/15 presented for left AKA on 11/08/15.  Pharmacy consulted to manage Lovenox and Coumadin for history of Afib.  RN reported that patient is a difficult stick so it would be challenging to be on IV heparin bridge.  INR currently sub-therapeutic; no bleeding reported.  Noted patient has been refusing Lovenox.   Goal of Therapy:  Anti-Xa level 0.6-1 units/ml 4hrs after LMWH dose given  INR 2 - 3 Monitor platelets by anticoagulation protocol: Yes    Plan:  - Coumadin 6mg  PO today.  - Lovenox to 60mg  SQ Q24H for CrCL < 30 ml/min - Daily PT / INR - CBC Q72H while on Lovenox  Thank you Anette Guarneri, PharmD [phone]  11/11/2015, 1:22 PM

## 2015-11-11 NOTE — Discharge Summary (Addendum)
Discharge Summary    [name] 24-Jun-1960 55 y.o. female  [id number]  Admission Date: 11/08/2015  Discharge Date: 11/11/15  Physician: Angelia Mould, MD  Admission Diagnosis: Left Below Knee stump wound post traumatic T79.8XXD Wound Complication open [id number]   HPI:   This is a 55 y.o. female patient of Dr. 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 She is also on HD MWF via right thigh graft. She takes coumadin for a-fib. Pt returns today after phone call from nurse at [facility] on 10/28/15. Reported that pt's stump site is oozing small amt of bloody drainage. Also noted that there is an area that is "split" on the stump incision. Noted the pt. Was in the ED on 7/5, and it was noted that the pt. had fallen on the stump, approx. 3 weeks prior; the ED note of 7/5 also indicated a small opening in the stump incision and small amt. of active bleeding. Per the ED Case Manager, Adv. HH was contacted to initiate wound care.  Pt states that the HD center obtained a culture of the wound at her left BKA stump yesterday. She states she is not taking any antibiotics now by mouth. She states she did not take the gabapentin prescribed on 10/19/15 visit.  She fell about the second week in June 2017, hitting her left BKA stump. I evaluated pt on 10/19/15 re this. Pt states the left BKA hurts during HD since she fell on the left BKA, and she is not able to complete HD due to this. She was taking Tylenol alternating with tramadol for pain which is intermittent. The pain wakes her up in the middle of the night.   She has seen [facility] but cannot proceed due to bleeding she has been having at the stump  She was evaluated in [facility] ED by Dr. Kellie Simmering on 07/27/15 for bleeding from right thigh graft. Her coumadin dose was decreased.   Hospital Course:  The patient was admitted to the hospital and taken to the operating room on 11/08/2015 and underwent: Left above the knee amputation.     The pt tolerated the procedure well and was transported to the PACU in good condition.   By POD 1, she was doing well.  A nutrition consult was obtained and she was started on feeding supplements.  Renal was also consulted as she is ESRD.    She is being bridged to coumadin with Lovenox due to Afib.  She also has DM and DM coordinator was consulted and appropriate changes made to medical regimen.    On POD 3, she is discharged to CIR.  The remainder of the hospital course consisted of increasing mobilization and increasing intake of solids without difficulty.  CBC    Component Value Date/Time   WBC 10.3 11/11/2015 0718   RBC 3.34* 11/11/2015 0718   HGB 9.3* 11/11/2015 0718   HCT 29.9* 11/11/2015 0718   PLT 223 11/11/2015 0718   MCV 89.5 11/11/2015 0718   MCH 27.8 11/11/2015 0718   MCHC 31.1 11/11/2015 0718   RDW 18.5* 11/11/2015 0718   LYMPHSABS 1.9 07/27/2015 1330   MONOABS 0.5 07/27/2015 1330   EOSABS 0.6 07/27/2015 1330   BASOSABS 0.1 07/27/2015 1330    BMET    Component Value Date/Time   NA 136 11/11/2015 0718   K 4.8 11/11/2015 0718   CL 94* 11/11/2015 0718   CO2 31 11/11/2015 0718   GLUCOSE 179* 11/11/2015 0718   BUN 39* 11/11/2015  [id number]   CREATININE 8.58* 11/11/2015 0718   CALCIUM 9.7 11/11/2015 0718   GFRNONAA 5* 11/11/2015 0718   GFRAA 5* 11/11/2015 0718      Discharge Instructions    Call MD for:  redness, tenderness, or signs of infection (pain, swelling, bleeding, redness, odor or green/yellow discharge around incision site)    Complete by:  As directed      Call MD for:  severe or increased pain, loss or decreased feeling  in affected limb(s)    Complete by:  As directed      Call MD for:  temperature >100.5    Complete by:  As directed      Discharge wound care:    Complete by:  As directed   Shower daily with soap and water starting 11/12/15     Resume previous diet    Complete by:  As directed            Discharge Diagnosis:  Left Below  Knee stump wound post traumatic T79.8XXD Wound Complication open [id number]  Secondary Diagnosis: Patient Active Problem List   Diagnosis Date Noted  . Atherosclerosis of lower extremity with gangrene ([place]) 11/08/2015  . Atrial fibrillation ([place]) 07/22/2015  . Anemia   . S/P BKA (below knee amputation) unilateral ([place])   . Abnormality of gait   . Paroxysmal atrial fibrillation (HCC)   . History of CVA (cerebrovascular accident)   . Type 2 diabetes mellitus with peripheral neuropathy (HCC)   . ESRD on dialysis ([place])   . Postoperative pain   . SIRS (systemic inflammatory response syndrome) (HCC)   . Essential hypertension   . Acute blood loss anemia   . Anemia of chronic disease   . Nonhealing nonsurgical wound 05/05/2015  . Chronic anticoagulation - Coumadin, CHADS2VASC=5 04/21/2015  . PAF (paroxysmal atrial fibrillation) ([place]) 04/21/2015  . Atrial fibrillation with RVR ([place]) 03/25/2015  . Atrial fibrillation with rapid ventricular response ([place]) 03/25/2015  . PAD (peripheral artery disease) ([place]) 01/11/2015  . Pleural effusion, right 08/16/2014  . Abdominal pain 08/16/2014  . Chest pain   . Cholecystitis   . Hypoxia   . 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 Ulcer of lower limb ([place]) 09/09/2013  . Pain in limb-left foot 11/19/2012  . End stage renal disease on dialysis ([place])   . DM (diabetes mellitus) type II controlled peripheral vascular disorder ([place])   . Peripheral vascular disease, unspecified ([place]) 07/02/2012  . Atherosclerosis of native arteries of the extremities with gangrene ([place]) 05/21/2012  . Hyperparathyroidism, secondary ([place]) 12/28/2010   Past Medical History  Diagnosis Date  . Hypertension   . CVA (cerebral infarction)     right parietal 05/19/2000  . Vaginal bleeding   . Hyperparathyroidism   . Pneumonia   . Stroke ([place])     2002,no residual  . Peripheral vascular disease ([place])   . GERD (gastroesophageal reflux disease)   . DM (diabetes mellitus) type II  controlled peripheral vascular disorder ([place])   . Slip/trip w/o falling due to stepping on object, subs July  2014  . ESRD (end stage renal disease) ([place])     dialysis M/W/F  . Constipation   . Atrial fibrillation ([place])   . Anemia   . Complication of anesthesia     Morphine makes her nauseated, states she has small veins  . Wound infection, posttraumatic ([place])     left BKA       Medication List    STOP taking these medications  cephALEXin 500 MG capsule  Commonly known as:  KEFLEX     silver sulfADIAZINE 1 % cream  Commonly known as:  SILVADENE      TAKE these medications        acetaminophen 325 MG tablet  Commonly known as:  TYLENOL  Take 650 mg by mouth every Monday, Wednesday, and Friday. With dialysis     CALCIUM-VITAMIN D PO  Take 1 tablet by mouth 3 (three) times daily with meals.     diazepam 5 MG tablet  Commonly known as:  VALIUM  Take 5 mg by mouth every 8 (eight) hours as needed for muscle spasms.     diltiazem 120 MG 24 hr capsule  Commonly known as:  CARDIZEM CD  Take 1 capsule (120 mg total) by mouth daily.     enoxaparin 60 MG/0.6ML injection  Commonly known as:  LOVENOX  Inject 0.6 mLs (60 mg total) into the skin daily.     feeding supplement (GLUCERNA SHAKE) Liqd  Take 237 mLs by mouth 3 (three) times daily with meals.     feeding supplement (NEPRO CARB STEADY) Liqd  Take 237 mLs by mouth daily at 3 pm.     gabapentin 100 MG capsule  Commonly known as:  NEURONTIN  Take 1 capsule (100 mg total) by mouth 3 (three) times daily.     glipiZIDE 10 MG tablet  Commonly known as:  GLUCOTROL  Take 0.5 tablets (5 mg total) by mouth daily.     ibuprofen 800 MG tablet  Commonly known as:  ADVIL,MOTRIN  Take 800 mg by mouth every 8 (eight) hours as needed for mild pain.     insulin aspart 100 UNIT/ML injection  Commonly known as:  novoLOG  Inject 3 Units into the skin 3 (three) times daily with meals.     oxyCODONE 5 MG immediate release  tablet  Commonly known as:  Oxy IR/ROXICODONE  Take 1-2 tablets (5-10 mg total) by mouth every 4 (four) hours as needed for moderate pain.     polyethylene glycol packet  Commonly known as:  MIRALAX / GLYCOLAX  Take 17 g by mouth daily.     RENA-VITE RX 1 MG Tabs  Take 1 mg by mouth daily.     traMADol 50 MG tablet  Commonly known as:  ULTRAM  Take 50-100 mg by mouth every 6 (six) hours as needed for moderate pain. Reported on 10/19/2015     VITAMIN B-12 PO  Take 1 tablet by mouth 4 (four) times a week. Non dialysis days. Tues, Thurs, Sat and Sunday     vitamin C 500 MG tablet  Commonly known as:  ASCORBIC ACID  Take 500 mg by mouth daily.     vitamin E 400 UNIT capsule  Take 400 Units by mouth daily.     warfarin 6 MG tablet  Commonly known as:  COUMADIN  Take 1 tablet by mouth daily or as directed by coumadin clinic        Prescriptions given: none  Instructions: 1.  Will need SSI 2.  Discontinue Lovenox once INR is therapeutic   Disposition: CIR  Patient's condition: is Good  Follow up: 1. Dr. [name] in 4 weeks   Leontine Locket, PA-C [facility] [phone] 11/11/2015  12:17 PM  I have interviewed the patient and examined the patient. I agree with the findings by the PA. Left AKA inspected yesterday and looked fine.   [name], MD [phone]

## 2015-11-11 NOTE — Procedures (Signed)
I was present at this dialysis session. I have reviewed the session itself and made appropriate changes.   Filed Weights   11/09/15 2044 11/10/15 2047 11/11/15 0652  Weight: 65.6 kg (144 lb 10 oz) 71 kg (156 lb 8.4 oz) 69.6 kg (153 lb 7 oz)     Recent Labs Lab 11/11/15 0718  NA 136  K 4.8  CL 94*  CO2 31  GLUCOSE 179*  BUN 39*  CREATININE 8.58*  CALCIUM 9.7  PHOS 3.6     Recent Labs Lab 11/09/15 0608 11/10/15 0658 11/11/15 0718  WBC 10.8* 9.1 10.3  HGB 10.4* 9.7* 9.3*  HCT 33.7* 31.8* 29.9*  MCV 90.3 90.6 89.5  PLT 288 218 223    Scheduled Meds: . acetaminophen  650 mg Oral Q M,W,F  . calcium-vitamin D  3 tablet Oral TID  . cephALEXin  500 mg Oral BID  . darbepoetin (ARANESP) injection - DIALYSIS  40 mcg Intravenous Q Fri-HD  . diltiazem  120 mg Oral Daily  . docusate sodium  100 mg Oral Daily  . enoxaparin (LOVENOX) injection  60 mg Subcutaneous Q24H  . feeding supplement (GLUCERNA SHAKE)  237 mL Oral TID WC  . feeding supplement (NEPRO CARB STEADY)  237 mL Oral Q1500  . ferric gluconate (FERRLECIT/NULECIT) IV  62.5 mg Intravenous Q Fri-HD  . gabapentin  100 mg Oral TID  . glipiZIDE  5 mg Oral QAC breakfast  . insulin aspart  0-15 Units Subcutaneous TID WC  . insulin aspart  0-5 Units Subcutaneous QHS  . insulin aspart  3 Units Subcutaneous TID WC  . multivitamin  1 tablet Oral QHS  . pantoprazole  40 mg Oral Daily  . polyethylene glycol  17 g Oral Daily  . vitamin B-12  50 mcg Oral Once per day on Sun Tue Thu Sat  . vitamin C  500 mg Oral Daily  . vitamin E  400 Units Oral Daily  . Warfarin - Pharmacist Dosing Inpatient   Does not apply q1800   Continuous Infusions:  PRN Meds:.sodium chloride, sodium chloride, acetaminophen **OR** acetaminophen, alteplase, alum & mag hydroxide-simeth, diazepam, guaiFENesin-dextromethorphan, heparin, hydrALAZINE, HYDROmorphone (DILAUDID) injection, ibuprofen, labetalol, lidocaine (PF), lidocaine-prilocaine, magnesium  sulfate 1 - 4 g bolus IVPB, metoprolol, ondansetron, oxyCODONE-acetaminophen, pentafluoroprop-tetrafluoroeth, phenol, potassium chloride, traMADol   Donetta Potts,  MD 11/11/2015, 8:35 AM

## 2015-11-11 NOTE — Clinical Social Work Note (Signed)
Clinical Social Work Assessment  Patient Details  Name: [name] MRN: [id number] Date of Birth: 10/07/1960  Date of referral:  11/11/15               Reason for consult:  Facility Placement                Permission sought to share information with:  Facility Art therapist granted to share information::     Name::        Agency::  Skilled nursing facilities  Relationship::     Contact Information:     Housing/Transportation Living arrangements for the past 2 months:  Apartment Source of Information:  Patient Patient Interpreter Needed:  None Criminal Activity/Legal Involvement Pertinent to Current Situation/Hospitalization:  No - Comment as needed Significant Relationships:  Other Family Members, Siblings Lives with:  Self Do you feel safe going back to the place where you live?  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 Her brother is married and her sister helps out with their dad. Patient agreeable to ST rehab as she understands she will get the nursing care and rehab she needs.   Social Worker assessment / plan:  CSW talked with patient about discharge planning and recommendation of CIR/SNF. Patient reported that she declined inpatient rehab because she needs a slower pace rehab. [name] reported that she last went to [facility] and enjoyed her stay there and going to play bingo. Patient did not commit to returning to [facility] but intends to think about it. [name] indicated that she currently has a manual wheelchair but will need a motorized wheelchair and  this was discussed. Patient was given the SNF list for [facility] and informed that she will be provided with facility responses.  Employment status:  Disabled (Comment on whether or not currently receiving Disability) Insurance information:  Medicare, Medicaid In [place] PT Recommendations:  Inpatient Rehab Consult, [place] (Patient declined inpatient rehab) Information / Referral to community resources:  [place] (Patient provided with SNF list for [facility])  Patient/Family's Response to care:  Patient understands that she cannot care for herself at home and wants ST rehab in a skilled facility.  Patient/Family's Understanding of and Emotional Response to Diagnosis, Current Treatment, and Prognosis:  [name] talked about the changes that having the revision (AKA) will bring to her life, i.e. feeling that she now needs a motorized wheelchair.  Emotional Assessment Appearance:  Appears stated age Attitude/Demeanor/Rapport:  Other (Appropriate) Affect (typically observed):  Calm, Appropriate Orientation:  Oriented to Self, Oriented to Place, Oriented to  Time, Oriented to Situation Alcohol / Substance use:  Never Used Psych involvement (Current and /or in the community):  No (Comment)  Discharge Needs  Concerns to be addressed:  Discharge Planning Concerns Readmission within the last 30 days:  No Current discharge risk:  None Barriers to Discharge:  No Barriers Identified   Sable Feil, LCSW 11/11/2015, 4:23 PM

## 2015-11-11 NOTE — Progress Notes (Addendum)
Inpatient Rehabilitation  Attempted to follow up with patient this morning; however, she was in HD.  Met her this afternoon for her decision regarding SNF versus CIR placement.  Patient stated that she preferred a slow pace due to her weakness and pain.  I attempted to assist nurse tech with getting patient on bedpan; however, despite Max assist multimodal cues patient unable to follow or allow assist with placement.  Agree with PT/OT recommendation that patient is not at a physical activity tolerance level that is appropriate for CIR at this time.  Will sign off.  Notified CSW and RN CM.  Please call with questions.    Carmelia Roller., CCC/SLP Admission Coordinator  [place]  Cell [phone]

## 2015-11-11 NOTE — Progress Notes (Signed)
OT Cancellation    11/11/15 0800  OT Visit Information  Last OT Received On 11/11/15  Reason Eval/Treat Not Completed Patient at procedure or test/ unavailable  [facility], OTR/L  [id number] 11/11/2015

## 2015-11-12 DIAGNOSIS — T8781 Dehiscence of amputation stump: Secondary | ICD-10-CM | POA: Diagnosis not present

## 2015-11-12 DIAGNOSIS — S91301D Unspecified open wound, right foot, subsequent encounter: Secondary | ICD-10-CM | POA: Diagnosis not present

## 2015-11-12 DIAGNOSIS — D62 Acute posthemorrhagic anemia: Secondary | ICD-10-CM | POA: Diagnosis not present

## 2015-11-12 DIAGNOSIS — S78119A Complete traumatic amputation at level between unspecified hip and knee, initial encounter: Secondary | ICD-10-CM | POA: Diagnosis not present

## 2015-11-12 DIAGNOSIS — E1129 Type 2 diabetes mellitus with other diabetic kidney complication: Secondary | ICD-10-CM | POA: Diagnosis not present

## 2015-11-12 DIAGNOSIS — E1122 Type 2 diabetes mellitus with diabetic chronic kidney disease: Secondary | ICD-10-CM | POA: Diagnosis not present

## 2015-11-12 DIAGNOSIS — L89314 Pressure ulcer of right buttock, stage 4: Secondary | ICD-10-CM | POA: Diagnosis not present

## 2015-11-12 DIAGNOSIS — E875 Hyperkalemia: Secondary | ICD-10-CM | POA: Diagnosis not present

## 2015-11-12 DIAGNOSIS — Z79899 Other long term (current) drug therapy: Secondary | ICD-10-CM | POA: Diagnosis not present

## 2015-11-12 DIAGNOSIS — L97123 Non-pressure chronic ulcer of left thigh with necrosis of muscle: Secondary | ICD-10-CM | POA: Diagnosis not present

## 2015-11-12 DIAGNOSIS — I120 Hypertensive chronic kidney disease with stage 5 chronic kidney disease or end stage renal disease: Secondary | ICD-10-CM | POA: Diagnosis not present

## 2015-11-12 DIAGNOSIS — E213 Hyperparathyroidism, unspecified: Secondary | ICD-10-CM | POA: Diagnosis not present

## 2015-11-12 DIAGNOSIS — E538 Deficiency of other specified B group vitamins: Secondary | ICD-10-CM | POA: Diagnosis not present

## 2015-11-12 DIAGNOSIS — I4891 Unspecified atrial fibrillation: Secondary | ICD-10-CM | POA: Diagnosis not present

## 2015-11-12 DIAGNOSIS — I69898 Other sequelae of other cerebrovascular disease: Secondary | ICD-10-CM | POA: Diagnosis not present

## 2015-11-12 DIAGNOSIS — K5909 Other constipation: Secondary | ICD-10-CM | POA: Diagnosis not present

## 2015-11-12 DIAGNOSIS — I739 Peripheral vascular disease, unspecified: Secondary | ICD-10-CM | POA: Diagnosis not present

## 2015-11-12 DIAGNOSIS — L97122 Non-pressure chronic ulcer of left thigh with fat layer exposed: Secondary | ICD-10-CM | POA: Diagnosis not present

## 2015-11-12 DIAGNOSIS — T879 Unspecified complications of amputation stump: Secondary | ICD-10-CM | POA: Diagnosis not present

## 2015-11-12 DIAGNOSIS — L259 Unspecified contact dermatitis, unspecified cause: Secondary | ICD-10-CM | POA: Diagnosis not present

## 2015-11-12 DIAGNOSIS — E088 Diabetes mellitus due to underlying condition with unspecified complications: Secondary | ICD-10-CM | POA: Diagnosis not present

## 2015-11-12 DIAGNOSIS — Z992 Dependence on renal dialysis: Secondary | ICD-10-CM | POA: Diagnosis not present

## 2015-11-12 DIAGNOSIS — I480 Paroxysmal atrial fibrillation: Secondary | ICD-10-CM | POA: Diagnosis not present

## 2015-11-12 DIAGNOSIS — R5383 Other fatigue: Secondary | ICD-10-CM | POA: Diagnosis not present

## 2015-11-12 DIAGNOSIS — N186 End stage renal disease: Secondary | ICD-10-CM | POA: Diagnosis not present

## 2015-11-12 DIAGNOSIS — E1142 Type 2 diabetes mellitus with diabetic polyneuropathy: Secondary | ICD-10-CM | POA: Diagnosis not present

## 2015-11-12 DIAGNOSIS — Z89612 Acquired absence of left leg above knee: Secondary | ICD-10-CM | POA: Diagnosis not present

## 2015-11-12 DIAGNOSIS — E1151 Type 2 diabetes mellitus with diabetic peripheral angiopathy without gangrene: Secondary | ICD-10-CM | POA: Diagnosis not present

## 2015-11-12 DIAGNOSIS — M7989 Other specified soft tissue disorders: Secondary | ICD-10-CM | POA: Diagnosis not present

## 2015-11-12 DIAGNOSIS — Z23 Encounter for immunization: Secondary | ICD-10-CM | POA: Diagnosis not present

## 2015-11-12 DIAGNOSIS — Z7901 Long term (current) use of anticoagulants: Secondary | ICD-10-CM | POA: Diagnosis not present

## 2015-11-12 DIAGNOSIS — N189 Chronic kidney disease, unspecified: Secondary | ICD-10-CM | POA: Diagnosis not present

## 2015-11-12 DIAGNOSIS — Z8673 Personal history of transient ischemic attack (TIA), and cerebral infarction without residual deficits: Secondary | ICD-10-CM | POA: Diagnosis not present

## 2015-11-12 DIAGNOSIS — A4902 Methicillin resistant Staphylococcus aureus infection, unspecified site: Secondary | ICD-10-CM | POA: Diagnosis not present

## 2015-11-12 DIAGNOSIS — K219 Gastro-esophageal reflux disease without esophagitis: Secondary | ICD-10-CM | POA: Diagnosis not present

## 2015-11-12 DIAGNOSIS — Z794 Long term (current) use of insulin: Secondary | ICD-10-CM | POA: Diagnosis not present

## 2015-11-12 DIAGNOSIS — F4322 Adjustment disorder with anxiety: Secondary | ICD-10-CM | POA: Diagnosis not present

## 2015-11-12 DIAGNOSIS — R259 Unspecified abnormal involuntary movements: Secondary | ICD-10-CM | POA: Diagnosis not present

## 2015-11-12 DIAGNOSIS — R404 Transient alteration of awareness: Secondary | ICD-10-CM | POA: Diagnosis not present

## 2015-11-12 DIAGNOSIS — D599 Acquired hemolytic anemia, unspecified: Secondary | ICD-10-CM | POA: Diagnosis not present

## 2015-11-12 DIAGNOSIS — L22 Diaper dermatitis: Secondary | ICD-10-CM | POA: Diagnosis not present

## 2015-11-12 DIAGNOSIS — Z7984 Long term (current) use of oral hypoglycemic drugs: Secondary | ICD-10-CM | POA: Diagnosis not present

## 2015-11-12 DIAGNOSIS — N939 Abnormal uterine and vaginal bleeding, unspecified: Secondary | ICD-10-CM | POA: Diagnosis not present

## 2015-11-12 DIAGNOSIS — R531 Weakness: Secondary | ICD-10-CM | POA: Diagnosis not present

## 2015-11-12 DIAGNOSIS — D631 Anemia in chronic kidney disease: Secondary | ICD-10-CM | POA: Diagnosis not present

## 2015-11-12 DIAGNOSIS — I482 Chronic atrial fibrillation: Secondary | ICD-10-CM | POA: Diagnosis not present

## 2015-11-12 DIAGNOSIS — S78112D Complete traumatic amputation at level between left hip and knee, subsequent encounter: Secondary | ICD-10-CM | POA: Diagnosis not present

## 2015-11-12 DIAGNOSIS — N2581 Secondary hyperparathyroidism of renal origin: Secondary | ICD-10-CM | POA: Diagnosis not present

## 2015-11-12 DIAGNOSIS — I10 Essential (primary) hypertension: Secondary | ICD-10-CM | POA: Diagnosis not present

## 2015-11-12 DIAGNOSIS — Z89519 Acquired absence of unspecified leg below knee: Secondary | ICD-10-CM | POA: Diagnosis not present

## 2015-11-12 LAB — PROTIME-INR
INR: 1.41 (ref 0.00–1.49)
PROTHROMBIN TIME: 17.4 s — AB (ref 11.6–15.2)

## 2015-11-12 LAB — GLUCOSE, CAPILLARY
Glucose-Capillary: 74 mg/dL (ref 65–99)
Glucose-Capillary: 164 mg/dL — ABNORMAL HIGH (ref 65–99)
Glucose-Capillary: 208 mg/dL — ABNORMAL HIGH (ref 65–99)

## 2015-11-12 MED ORDER — OXYCODONE HCL 5 MG PO TABS: 5.0000 mg | ORAL_TABLET | ORAL | Status: DC | PRN

## 2015-11-12 MED ORDER — TRAMADOL HCL 50 MG PO TABS: 50.0000 mg | ORAL_TABLET | Freq: Four times a day (QID) | ORAL | Status: DC | PRN

## 2015-11-12 MED ORDER — DIAZEPAM 5 MG PO TABS: 5.0000 mg | ORAL_TABLET | Freq: Three times a day (TID) | ORAL | Status: AC | PRN

## 2015-11-12 MED ORDER — WARFARIN SODIUM 7.5 MG PO TABS
7.5000 mg | ORAL_TABLET | Freq: Once | ORAL | Status: AC
  Administered 2015-11-12: 7.5 mg via ORAL
  Filled 2015-11-12: qty 1

## 2015-11-12 MED ORDER — DIAZEPAM 5 MG PO TABS: 5.0000 mg | ORAL_TABLET | Freq: Three times a day (TID) | ORAL | Status: DC | PRN

## 2015-11-12 NOTE — Progress Notes (Signed)
Pt prepared for d/c to SNF. IV d/c'd. Skin intact except as charted in most recent assessments. Vitals are stable. Report called to Chrissie Noa @ [facility] (receiving facility). Pt to be transported by ambulance service.  Jillyn Ledger, MBA, BSN, RN

## 2015-11-12 NOTE — Progress Notes (Signed)
Assisted PTAR with transfer to stretcher for D/C to SNF.  Left message for family member, Lameka Dynes, to pickup patient's walker.  Will leave at desk.  Jillyn Ledger, MBA, BSN, RN

## 2015-11-12 NOTE — Progress Notes (Signed)
ANTICOAGULATION CONSULT NOTE   Pharmacy Consult:  Lovenox / Coumadin Indication: atrial fibrillation  Allergies  Allergen Reactions  . Doxycycline Itching  . Peroxyl [Hydrogen Peroxide] Hives and Rash    Patient Measurements: Height: 5\' 2"  (157.5 cm) Weight: 151 lb 14.4 oz (68.9 kg) IBW/kg (Calculated) : 50.1  Vital Signs: Temp: 98.7 F (37.1 C) (07/22 0743) Temp Source: Oral (07/22 0743) BP: 135/78 mmHg (07/22 0743) Pulse Rate: 109 (07/22 0743)  Labs:  Recent Labs  11/10/15 0658 11/11/15 0718 11/12/15 0632  HGB 9.7* 9.3*  --   HCT 31.8* 29.9*  --   PLT 218 223  --   LABPROT 17.5*  --  17.4*  INR 1.43  --  1.41  CREATININE 6.01* 8.58*  --     Estimated Creatinine Clearance: 6.9 mL/min (by C-G formula based on Cr of 8.58).     Assessment: 55 YOF with history of left BKA on 06/28/15 presented for left AKA on 11/08/15.  Pharmacy consulted to manage Lovenox and Coumadin for history of Afib.   INR = 1.41 CBC stable  Goal of Therapy:  Anti-Xa level 0.6-1 units/ml 4hrs after LMWH dose given  INR 2 - 3 Monitor platelets by anticoagulation protocol: Yes    Plan:  - Coumadin 7.5 mg PO today.  - Lovenox to 60mg  SQ Q24H for CrCL < 30 ml/min - Daily PT / INR - CBC Q72H while on Lovenox  Thank you Anette Guarneri, PharmD [phone]  11/12/2015, 10:57 AM

## 2015-11-12 NOTE — Progress Notes (Signed)
Subjective:  Tolerated HD yest on schedule /for dc to nh today?   Objective Vital signs in last 24 hours: Filed Vitals:   11/11/15 1134 11/11/15 2030 11/12/15 0551 11/12/15 0743  BP: 100/50 101/21 130/71 135/78  Pulse: 112 106 107 109  Temp: 99.3 F (37.4 C) 99.2 F (37.3 C) 99.2 F (37.3 C) 98.7 F (37.1 C)  TempSrc: Oral Oral Oral Oral  Resp: 18 19 20 18   Height:      Weight:      SpO2: 96% 100% 99% 97%   Weight change: -2.1 kg (-4 lb 10.1 oz)  Physical Exam General: Pleasant, NAD  AAF OX3 Heart: S1,S2, RRR Lungs: CTA A/P Abdomen: Active BS, non-tender/ nondistended Extremities: No RLE edema. New ace wrap L AKA Dialysis Access: R thigh AVG + bruit  Dialysis Orders: [facility] MWF 3.5 hours 450/Auto 1.5  EDW 70 kg 2.0 K/2.0 Ca  UF profile 2 Linear Na Heparin 1000 units IV q tx Mircera 100 mcg IV q 2 weeks (11/02/15 Last HGB 10.5 11/02/15) Venofer 50 mg IV weekly (last dose 11/02/15 Fe 65 Tsat 39 10/19/15) Calcium Carb 3 capsules TID with meals. No calcimimetic/VDRA.   Problem/Plan: 1. . Atherosclerosis of lower extremity with gangrene : L AKA 11/08/15 per Dr. [name]. DC to [facility] ? today   2. ESRD - MWF at [facility]. HD yesterday.  3. Hypertension/volume - Better control of BP today. HD yesterday post wt 68.9 kg Net UF 700 Lower EDW on DC.~~69.0  4. Anemia - HGB 9.3 .on ESA. Weekly Venofer .  5. Metabolic bone disease - Continue binders. Phos 6.0>3.6  Corec ca trend up  Use 2.0 ca bath decr po ca binder 3 to 2 6. Nutrition - Albumin 2.8.>2.5 renal/Carb mod diet with fld restrictions. Renal vit/prostat 7. DM: per primary 8. Coumadin therapy/AFib. Per pharmacy.    Ernest Haber, PA-C [facility] Beeper [phone] 11/12/2015,9:39 AM  LOS: 4 days   Labs: Basic Metabolic Panel:  Recent Labs Lab 11/09/15 0608 11/09/15 1330 11/10/15 0658 11/11/15 0718  NA 138  --  133* 136  K 4.5  --  4.7 4.8  CL 95*  --  94* 94*  CO2 30  --  31 31  GLUCOSE  139*  --  170* 179*  BUN 21*  --  16 39*  CREATININE 9.21*  --  6.01* 8.58*  CALCIUM 8.9  --  8.5* 9.7  PHOS  --  6.0*  --  3.6   Liver Function Tests:  Recent Labs Lab 11/08/15 1133 11/11/15 0718  AST 20  --   ALT 16  --   ALKPHOS 54  --   BILITOT 0.5  --   PROT 8.2*  --   ALBUMIN 2.8* 2.1*   No results for input(s): LIPASE, AMYLASE in the last 168 hours. No results for input(s): AMMONIA in the last 168 hours. CBC:  Recent Labs Lab 11/08/15 1133 11/09/15 0608 11/10/15 0658 11/11/15 0718  WBC 8.4 10.8* 9.1 10.3  HGB 11.1* 10.4* 9.7* 9.3*  HCT 36.4 33.7* 31.8* 29.9*  MCV 90.5 90.3 90.6 89.5  PLT 281 288 218 223   Cardiac Enzymes: No results for input(s): CKTOTAL, CKMB, CKMBINDEX, TROPONINI in the last 168 hours. CBG:  Recent Labs Lab 11/10/15 2045 11/11/15 1133 11/11/15 1707 11/11/15 2030 11/12/15 0741  GLUCAP 197* 226* 261* 162* 74    Studies/Results: No results found. Medications:   . acetaminophen  650 mg Oral Q M,W,F  . calcium-vitamin D  3 tablet Oral TID  . cephALEXin  500 mg Oral BID  . darbepoetin (ARANESP) injection - DIALYSIS  40 mcg Intravenous Q Fri-HD  . diltiazem  120 mg Oral Daily  . docusate sodium  100 mg Oral Daily  . enoxaparin (LOVENOX) injection  60 mg Subcutaneous Q24H  . feeding supplement (GLUCERNA SHAKE)  237 mL Oral TID WC  . feeding supplement (NEPRO CARB STEADY)  237 mL Oral Q1500  . ferric gluconate (FERRLECIT/NULECIT) IV  62.5 mg Intravenous Q Fri-HD  . gabapentin  100 mg Oral TID  . glipiZIDE  5 mg Oral QAC breakfast  . insulin aspart  0-15 Units Subcutaneous TID WC  . insulin aspart  0-5 Units Subcutaneous QHS  . insulin aspart  3 Units Subcutaneous TID WC  . multivitamin  1 tablet Oral QHS  . pantoprazole  40 mg Oral Daily  . polyethylene glycol  17 g Oral Daily  . vitamin B-12  50 mcg Oral Once per day on Sun Tue Thu Sat  . vitamin C  500 mg Oral Daily  . vitamin E  400 Units Oral Daily  . Warfarin - Pharmacist  Dosing Inpatient   Does not apply [phone]

## 2015-11-12 NOTE — Clinical Social Work Note (Signed)
Per MD patient ready to DC back to [facility], patient/family (Father Adolphus Liane Comber and [place]), and facility notified of patient's DC. RN given number for report. DC packet on patient's chart. Ambulance transport requested by RN. CSW signing off at this time.   Liz Beach MSW, [place], [place], [phone]

## 2015-11-12 NOTE — Progress Notes (Signed)
Contacted on call MD (Brabham).  Informed him that we needed signed RX for valium, ultram & oxycodone, so that the patient could be discharged to SNF, today.  Jillyn Ledger, MBA, BSN, RN

## 2015-11-14 ENCOUNTER — Non-Acute Institutional Stay (SKILLED_NURSING_FACILITY): Payer: Medicare Other | Admitting: Internal Medicine

## 2015-11-14 ENCOUNTER — Encounter: Payer: Self-pay | Admitting: Internal Medicine

## 2015-11-14 DIAGNOSIS — I739 Peripheral vascular disease, unspecified: Secondary | ICD-10-CM | POA: Diagnosis not present

## 2015-11-14 DIAGNOSIS — I10 Essential (primary) hypertension: Secondary | ICD-10-CM

## 2015-11-14 DIAGNOSIS — N186 End stage renal disease: Secondary | ICD-10-CM

## 2015-11-14 DIAGNOSIS — Z992 Dependence on renal dialysis: Secondary | ICD-10-CM

## 2015-11-14 DIAGNOSIS — Z89612 Acquired absence of left leg above knee: Secondary | ICD-10-CM

## 2015-11-14 DIAGNOSIS — T879 Unspecified complications of amputation stump: Secondary | ICD-10-CM | POA: Diagnosis not present

## 2015-11-14 DIAGNOSIS — D62 Acute posthemorrhagic anemia: Secondary | ICD-10-CM | POA: Diagnosis not present

## 2015-11-14 DIAGNOSIS — E1142 Type 2 diabetes mellitus with diabetic polyneuropathy: Secondary | ICD-10-CM

## 2015-11-14 DIAGNOSIS — E1151 Type 2 diabetes mellitus with diabetic peripheral angiopathy without gangrene: Secondary | ICD-10-CM | POA: Diagnosis not present

## 2015-11-14 DIAGNOSIS — Z8673 Personal history of transient ischemic attack (TIA), and cerebral infarction without residual deficits: Secondary | ICD-10-CM

## 2015-11-14 DIAGNOSIS — E538 Deficiency of other specified B group vitamins: Secondary | ICD-10-CM

## 2015-11-14 DIAGNOSIS — T8781 Dehiscence of amputation stump: Secondary | ICD-10-CM

## 2015-11-14 DIAGNOSIS — I480 Paroxysmal atrial fibrillation: Secondary | ICD-10-CM

## 2015-11-14 NOTE — Progress Notes (Signed)
MRN: [id number] Name: [name]  Sex: female Age: 55 y.o. DOB: 01-04-1961  [place] #:  Facility/Room: [facility] / 126 A Level Of Care: SNF Provider: Noah Delaine. Sheppard Coil, MD Emergency Contacts: Extended Emergency Contact Information Primary Emergency Contact: Wynia,Adolphus Address: [address]          [place], [place] [address] Johnnette Litter of [place] Phone: [phone] Relation: Father Secondary Emergency Contact: Darletta Moll of [place] Phone: [phone] Mobile Phone: [phone] Relation: Sister  Code Status: Full Code  Allergies: Doxycycline and Peroxyl [hydrogen peroxide]  Chief Complaint  Patient presents with  . New Admit To SNF    Admit to Facility    HPI: Patient is 55 y.o. female ESRD on HD MWF, s/p BKA on 06/28/2015 who fell about 3 weks ag and whose stump has dehisced and has been oozzing blood ever since. Pt was admitted to [facility] from 7/18-21and underwent a L AKA apparently without complication. Pt is admitted to SNF for OT/PT. While at SNF pt will be followed for DM2, tx with glipizide and meal time insuli, HTN, tx with diltiazem and neuropathy , tx with neuriontin.  Past Medical History:  Diagnosis Date  . Anemia   . Atrial fibrillation ([place])   . Complication of anesthesia    Morphine makes her nauseated, states she has small veins  . Constipation   . CVA (cerebral infarction)    right parietal 05/19/2000  . DM (diabetes mellitus) type II controlled peripheral vascular disorder ([place])   . ESRD (end stage renal disease) ([place])    dialysis M/W/F  . GERD (gastroesophageal reflux disease)   . Hyperparathyroidism   . Hypertension   . Peripheral vascular disease ([place])   . Pneumonia   . Slip/trip w/o falling due to stepping on object, subs July  2014  . Stroke ([place])    2002,no residual  . Vaginal bleeding   . Wound infection, posttraumatic ([place])    left BKA    Past Surgical History:  Procedure Laterality Date  .  ABDOMINAL AORTAGRAM N/A 05/27/2012   Procedure: ABDOMINAL Maxcine Ham;  Surgeon: Serafina Mitchell, MD;  Location: [facility] CATH LAB;  Service: Cardiovascular;  Laterality: N/A;  . AMPUTATION Left 06/28/2015   Procedure:  Left BELOW KNEE amputation;  Surgeon: Angelia Mould, MD;  Location: [facility];  Service: Vascular;  Laterality: Left;  . AMPUTATION Left 11/08/2015   Procedure: AMPUTATION ABOVE KNEE / LEFT;  Surgeon: Angelia Mould, MD;  Location: [facility];  Service: Vascular;  Laterality: Left;  . APPLICATION OF WOUND VAC Left 05/05/2015   Procedure: APPLICATION OF WOUND VAC;  Surgeon: Angelia Mould, MD;  Location: [facility];  Service: Vascular;  Laterality: Left;  . AV FISTULA PLACEMENT Right    upper thigh  . BRACHIAL ARTERY GRAFT     x2 for dialysis  . dental extractions Bilateral   . DILATION AND CURETTAGE OF UTERUS  1986  . FOOT SURGERY Left    5 TOES AMPUTATED  . I&D EXTREMITY Left 05/05/2015   Procedure: DEBRIDEMENT HEEL WOUND;  Surgeon: Angelia Mould, MD;  Location: [facility];  Service: Vascular;  Laterality: Left;  . KNEE SURGERY Right X 2  . LEG SURGERY Right   . LOWER EXTREMITY ANGIOGRAM Left 05/27/2012   Procedure: LOWER EXTREMITY ANGIOGRAM;  Surgeon: Serafina Mitchell, MD;  Location: [facility] CATH LAB;  Service: Cardiovascular;  Laterality: Left;  lt leg angio  . parathyroidectomy with autotransplant  12/07/2010  . PERIPHERAL VASCULAR CATHETERIZATION  N/A 01/11/2015   Procedure: Lower Extremity Angiography;  Surgeon: Serafina Mitchell, MD;  Location: [facility] CV LAB;  Service: Cardiovascular;  Laterality: N/A;  . TOOTH EXTRACTION  Sep 08, 2014  . TRANSMETATARSAL AMPUTATION Left 07/15/2012   Procedure: TRANSMETATARSAL AMPUTATION;  Surgeon: Angelia Mould, MD;  Location: [facility];  Service: Vascular;  Laterality: Left;      Medication List       Accurate as of 11/14/15 11:59 PM. Always use your most recent med list.          acetaminophen 325 MG tablet Commonly known as:   TYLENOL Take 650 mg by mouth every Monday, Wednesday, and Friday. With dialysis   CALCIUM-VITAMIN D PO Take 1 tablet by mouth 3 (three) times daily with meals.   diazepam 5 MG tablet Commonly known as:  VALIUM Take 1 tablet (5 mg total) by mouth every 8 (eight) hours as needed for muscle spasms.   diltiazem 120 MG 24 hr capsule Commonly known as:  CARDIZEM CD Take 1 capsule (120 mg total) by mouth daily.   enoxaparin 60 MG/0.6ML injection Commonly known as:  LOVENOX Inject 0.6 mLs (60 mg total) into the skin daily.   feeding supplement (GLUCERNA SHAKE) Liqd Take 237 mLs by mouth 3 (three) times daily with meals.   feeding supplement (NEPRO CARB STEADY) Liqd Take 237 mLs by mouth daily at 3 pm.   gabapentin 100 MG capsule Commonly known as:  NEURONTIN Take 1 capsule (100 mg total) by mouth 3 (three) times daily.   glipiZIDE 10 MG tablet Commonly known as:  GLUCOTROL Take 0.5 tablets (5 mg total) by mouth daily.   ibuprofen 800 MG tablet Commonly known as:  ADVIL,MOTRIN Take 800 mg by mouth every 8 (eight) hours as needed for mild pain.   insulin aspart 100 UNIT/ML injection Commonly known as:  novoLOG Inject 3 Units into the skin 3 (three) times daily with meals.   oxyCODONE 5 MG immediate release tablet Commonly known as:  Oxy IR/ROXICODONE Take 1-2 tablets (5-10 mg total) by mouth every 4 (four) hours as needed for moderate pain.   polyethylene glycol packet Commonly known as:  MIRALAX / GLYCOLAX Take 17 g by mouth daily.   RENA-VITE RX 1 MG Tabs Take 1 mg by mouth daily.   traMADol 50 MG tablet Commonly known as:  ULTRAM Take 1-2 tablets (50-100 mg total) by mouth every 6 (six) hours as needed for moderate pain. Reported on 10/19/2015   VITAMIN B-12 PO Take 1 tablet by mouth daily. Every Sun, Tue, Thu, Sat for supplement on non-Dialysis days   vitamin C 500 MG tablet Commonly known as:  ASCORBIC ACID Take 500 mg by mouth daily.   vitamin E 400 UNIT  capsule Take 400 Units by mouth daily.   warfarin 6 MG tablet Commonly known as:  COUMADIN Take 6 mg by mouth daily at 6 PM.       Meds ordered this encounter  Medications  . warfarin (COUMADIN) 6 MG tablet    Sig: Take 6 mg by mouth daily at 6 PM.    Immunization History  Administered Date(s) Administered  . Influenza,inj,Quad PF,36+ Mos 01/19/2014    Social History  Substance Use Topics  . Smoking status: Never Smoker  . Smokeless tobacco: Never Used  . Alcohol use No    Family history is   Family History  Problem Relation Age of Onset  . Hypertension Father   . Kidney disease Mother  Review of Systems  DATA OBTAINED: from patient, nurse GENERAL:  no fevers, fatigue, appetite changes SKIN: No itching, rash or wounds EYES: No eye pain, redness, discharge EARS: No earache, tinnitus, change in hearing NOSE: No congestion, drainage or bleeding  MOUTH/THROAT: No mouth or tooth pain, No sore throat RESPIRATORY: No cough, wheezing, SOB CARDIAC: No chest pain, palpitations, lower extremity edema  GI: No abdominal pain, No N/V/D or constipation, No heartburn or reflux  GU: No dysuria, frequency or urgency, or incontinence  MUSCULOSKELETAL: No unrelieved bone/joint pain NEUROLOGIC: No headache, dizziness or focal weakness PSYCHIATRIC: No c/o anxiety or sadness   Vitals:   11/14/15 1149  BP: 120/78  Pulse: 62  Resp: 18  Temp: 98.7 F (37.1 C)    SpO2 Readings from Last 1 Encounters:  11/15/15 100%        Physical Exam  GENERAL APPEARANCE: Alert, conversant,  No acute distress.  SKIN: No diaphoresis rash HEAD: Normocephalic, atraumatic  EYES: Conjunctiva/lids clear. Pupils round, reactive. EOMs intact.  EARS: External exam WNL, canals clear. Hearing grossly normal.  NOSE: No deformity or discharge.  MOUTH/THROAT: Lips w/o lesions  RESPIRATORY: Breathing is even, unlabored. Lung sounds are clear   CARDIOVASCULAR: Heart RRR no murmurs, rubs or  gallops. No peripheral edema.; AV fistula upper thigh with thrill   GASTROINTESTINAL: Abdomen is soft, non-tender, not distended w/ normal bowel sounds. GENITOURINARY: Bladder non tender, not distended  MUSCULOSKELETAL: L stump dressed, without excessive warmth or swelling NEUROLOGIC:  Cranial nerves 2-12 grossly intact. Moves all extremities  PSYCHIATRIC: Mood and affect appropriate to situation, no behavioral issues  Patient Active Problem List   Diagnosis Date Noted  . AKA stump complication ([place]) 123XX123  . Dehiscence of amputation stump ([place]) 11/22/2015  . S/P AKA (above knee amputation) unilateral ([place]) 11/22/2015  . Postoperative anemia due to acute blood loss 11/22/2015  . B12 deficiency 11/22/2015  . Atherosclerosis of lower extremity with gangrene ([place]) 11/08/2015  . Atrial fibrillation ([place]) 07/22/2015  . Anemia   . S/P BKA (below knee amputation) unilateral ([place])   . Abnormality of gait   . Paroxysmal atrial fibrillation (HCC)   . History of CVA (cerebrovascular accident)   . Type 2 diabetes mellitus with peripheral neuropathy (HCC)   . ESRD on dialysis ([place])   . Postoperative pain   . SIRS (systemic inflammatory response syndrome) (HCC)   . Essential hypertension   . Acute blood loss anemia   . Anemia of chronic disease   . Nonhealing nonsurgical wound 05/05/2015  . Chronic anticoagulation - Coumadin, CHADS2VASC=5 04/21/2015  . PAF (paroxysmal atrial fibrillation) ([place]) 04/21/2015  . Atrial fibrillation with RVR ([place]) 03/25/2015  . Atrial fibrillation with rapid ventricular response ([place]) 03/25/2015  . PAD (peripheral artery disease) ([place]) 01/11/2015  . Pleural effusion, right 08/16/2014  . Abdominal pain 08/16/2014  . Chest pain   . Cholecystitis   . Hypoxia   . 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 Ulcer of lower limb ([place]) 09/09/2013  . Pain in limb-left foot 11/19/2012  . End stage renal disease on dialysis ([place])   . DM (diabetes mellitus) type II controlled  peripheral vascular disorder ([place])   . Peripheral vascular disease, unspecified ([place]) 07/02/2012  . Atherosclerosis of native arteries of the extremities with gangrene ([place]) 05/21/2012  . Hyperparathyroidism, secondary ([place]) 12/28/2010       Component Value Date/Time   WBC 14.0 (H) 11/15/2015 1130   RBC 3.30 (L) 11/15/2015 1130   HGB 9.1 (L) 11/15/2015 1130  HCT 28.6 (L) 11/15/2015 1130   PLT 330 11/15/2015 1130   MCV 86.7 11/15/2015 1130   LYMPHSABS 1.5 11/15/2015 1130   MONOABS 0.7 11/15/2015 1130   EOSABS 0.3 11/15/2015 1130   BASOSABS 0.1 11/15/2015 1130        Component Value Date/Time   NA 130 (L) 11/15/2015 1130   NA 136 (A) 11/11/2015   K 6.5 (HH) 11/15/2015 1130   CL 89 (L) 11/15/2015 1130   CO2 25 11/15/2015 1130   GLUCOSE 171 (H) 11/15/2015 1130   BUN 94 (H) 11/15/2015 1130   BUN 39 (A) 11/11/2015   CREATININE 11.04 (H) 11/15/2015 1130   CALCIUM 9.1 11/15/2015 1130   PROT 7.3 11/15/2015 1130   ALBUMIN 2.3 (L) 11/15/2015 1130   AST 40 11/15/2015 1130   ALT 38 11/15/2015 1130   ALKPHOS 59 11/15/2015 1130   BILITOT 0.5 11/15/2015 1130   GFRNONAA 3 (L) 11/15/2015 1130   GFRAA 4 (L) 11/15/2015 1130    Lab Results  Component Value Date   HGBA1C 6.4 10/11/2015    Lab Results  Component Value Date   CHOL 136 10/27/2013   HDL 48 10/27/2013   LDLCALC 73 10/27/2013   TRIG 74 10/27/2013   CHOLHDL 2.8 10/27/2013     No results found.  Not all labs, radiology exams or other studies done during hospitalization come through on my EPIC note; however they are reviewed by me.    Assessment and Plan  DEHISCENCE OF L BKA STUMP/ S/P L AKA / PVD- 2/2 to fall about 3 weeks prior to AKA surgery;stump oozed blood daily since fall; pt is prophylaxed with coumadin SNF - admitted for OT/PT; coumadin as prophylaxis and keflex      Essential hypertension SNF - stable; cont diltiazem 120 mg daily  Postoperative anemia due to acute blood loss SNF - d/c Hb 9.3;  will f/u CBC  PAF (paroxysmal atrial fibrillation) ([place]) SNF - rate controlled with diltiazem;propylaxes with coumadin which will be actively titrate.  DM (diabetes mellitus) type II controlled peripheral vascular disorder (HCC) SNF - cont glipizide5 mg daily and 3u novolog at each meal  Type 2 diabetes mellitus with peripheral neuropathy (HCC) SNF - ad above; cont neurontin 100 mg TID  ESRD on dialysis ([place]) SNF - cont MWF dialysis  History of CVA (cerebrovascular accident) SNF - prophlaxed with coumadin, usef for AF  B12 deficiency SNF - cont 4 times weekly on non dialysis days   Time spent > 45 min;> 50% of time with patient was spent reviewing records, labs, tests and studies, counseling and developing plan of care  Webb Silversmith D. Sheppard Coil, MD

## 2015-11-15 ENCOUNTER — Emergency Department (HOSPITAL_COMMUNITY)
Admission: EM | Admit: 2015-11-15 | Discharge: 2015-11-15 | Disposition: A | Discharge status: Home / Self Care | Payer: Medicare Other | Attending: Emergency Medicine | Admitting: Emergency Medicine

## 2015-11-15 ENCOUNTER — Emergency Department (HOSPITAL_BASED_OUTPATIENT_CLINIC_OR_DEPARTMENT_OTHER): Admit: 2015-11-15 | Discharge: 2015-11-15 | Disposition: A | Discharge status: Home / Self Care | Payer: Medicare Other

## 2015-11-15 ENCOUNTER — Emergency Department (HOSPITAL_COMMUNITY): Payer: Medicare Other

## 2015-11-15 ENCOUNTER — Encounter (HOSPITAL_COMMUNITY): Payer: Self-pay | Admitting: Emergency Medicine

## 2015-11-15 DIAGNOSIS — I120 Hypertensive chronic kidney disease with stage 5 chronic kidney disease or end stage renal disease: Secondary | ICD-10-CM | POA: Insufficient documentation

## 2015-11-15 DIAGNOSIS — Z992 Dependence on renal dialysis: Secondary | ICD-10-CM | POA: Diagnosis not present

## 2015-11-15 DIAGNOSIS — Z794 Long term (current) use of insulin: Secondary | ICD-10-CM | POA: Insufficient documentation

## 2015-11-15 DIAGNOSIS — E1142 Type 2 diabetes mellitus with diabetic polyneuropathy: Secondary | ICD-10-CM | POA: Insufficient documentation

## 2015-11-15 DIAGNOSIS — E875 Hyperkalemia: Secondary | ICD-10-CM | POA: Insufficient documentation

## 2015-11-15 DIAGNOSIS — Z8673 Personal history of transient ischemic attack (TIA), and cerebral infarction without residual deficits: Secondary | ICD-10-CM | POA: Insufficient documentation

## 2015-11-15 DIAGNOSIS — E1122 Type 2 diabetes mellitus with diabetic chronic kidney disease: Secondary | ICD-10-CM | POA: Insufficient documentation

## 2015-11-15 DIAGNOSIS — N186 End stage renal disease: Secondary | ICD-10-CM | POA: Diagnosis not present

## 2015-11-15 DIAGNOSIS — M7989 Other specified soft tissue disorders: Secondary | ICD-10-CM | POA: Diagnosis not present

## 2015-11-15 DIAGNOSIS — R531 Weakness: Secondary | ICD-10-CM | POA: Diagnosis not present

## 2015-11-15 DIAGNOSIS — R5383 Other fatigue: Secondary | ICD-10-CM | POA: Diagnosis not present

## 2015-11-15 DIAGNOSIS — Z7901 Long term (current) use of anticoagulants: Secondary | ICD-10-CM | POA: Insufficient documentation

## 2015-11-15 DIAGNOSIS — Z79899 Other long term (current) drug therapy: Secondary | ICD-10-CM | POA: Insufficient documentation

## 2015-11-15 DIAGNOSIS — E1151 Type 2 diabetes mellitus with diabetic peripheral angiopathy without gangrene: Secondary | ICD-10-CM | POA: Insufficient documentation

## 2015-11-15 DIAGNOSIS — Z7984 Long term (current) use of oral hypoglycemic drugs: Secondary | ICD-10-CM | POA: Insufficient documentation

## 2015-11-15 LAB — COMPREHENSIVE METABOLIC PANEL
ALT: 38 U/L (ref 14–54)
AST: 40 U/L (ref 15–41)
Albumin: 2.3 g/dL — ABNORMAL LOW (ref 3.5–5.0)
Alkaline Phosphatase: 59 U/L (ref 38–126)
Anion gap: 16 — ABNORMAL HIGH (ref 5–15)
BILIRUBIN TOTAL: 0.5 mg/dL (ref 0.3–1.2)
BUN: 94 mg/dL — AB (ref 6–20)
CHLORIDE: 89 mmol/L — AB (ref 101–111)
CO2: 25 mmol/L (ref 22–32)
CREATININE: 11.04 mg/dL — AB (ref 0.44–1.00)
Calcium: 9.1 mg/dL (ref 8.9–10.3)
GFR calc Af Amer: 4 mL/min — ABNORMAL LOW (ref >60)
GFR, EST NON AFRICAN AMERICAN: 3 mL/min — AB (ref >60)
Glucose, Bld: 171 mg/dL — ABNORMAL HIGH (ref 65–99)
Potassium: 6.5 mmol/L (ref 3.5–5.1)
Sodium: 130 mmol/L — ABNORMAL LOW (ref 135–145)
TOTAL PROTEIN: 7.3 g/dL (ref 6.5–8.1)

## 2015-11-15 LAB — CBC WITH DIFFERENTIAL/PLATELET
BASOS ABS: 0.1 10*3/uL (ref 0.0–0.1)
BASOS PCT: 0 %
EOS ABS: 0.3 10*3/uL (ref 0.0–0.7)
EOS PCT: 2 %
HCT: 28.6 % — ABNORMAL LOW (ref 36.0–46.0)
Hemoglobin: 9.1 g/dL — ABNORMAL LOW (ref 12.0–15.0)
LYMPHS PCT: 11 %
Lymphs Abs: 1.5 10*3/uL (ref 0.7–4.0)
MCH: 27.6 pg (ref 26.0–34.0)
MCHC: 31.8 g/dL (ref 30.0–36.0)
MCV: 86.7 fL (ref 78.0–100.0)
Monocytes Absolute: 0.7 10*3/uL (ref 0.1–1.0)
Monocytes Relative: 5 %
Neutro Abs: 11.4 10*3/uL — ABNORMAL HIGH (ref 1.7–7.7)
Neutrophils Relative %: 82 %
PLATELETS: 330 10*3/uL (ref 150–400)
RBC: 3.3 MIL/uL — AB (ref 3.87–5.11)
RDW: 18.6 % — ABNORMAL HIGH (ref 11.5–15.5)
WBC: 14 10*3/uL — AB (ref 4.0–10.5)

## 2015-11-15 LAB — I-STAT CG4 LACTIC ACID, ED: LACTIC ACID, VENOUS: 1.86 mmol/L (ref 0.5–1.9)

## 2015-11-15 LAB — CBG MONITORING, ED: Glucose-Capillary: 187 mg/dL — ABNORMAL HIGH (ref 65–99)

## 2015-11-15 LAB — PROTIME-INR
INR: 2.56 — AB (ref 0.00–1.49)
PROTHROMBIN TIME: 27.2 s — AB (ref 11.6–15.2)

## 2015-11-15 NOTE — ED Provider Notes (Signed)
[facility] DEPT Provider Note   CSN: [id number] Arrival date & time: 11/15/15  [id number]  First Provider Contact:  First MD Initiated Contact with Patient 11/15/15 1004        History   Chief Complaint Chief Complaint  Patient presents with  . Fatigue    HPI [name] is a 55 y.o. female.  The history is provided by the patient and medical records.   55 y.o. F with hx of anemia, AFIB on coumadin, prior CVA in 2002, DM, ESRD on hemodialysis MWF, GERD, HTN, PVD, recent left AKA presenting to the ED for generalized weakness.  Patient states since her surgery on 11/08/15 with Dr. [name] she has been feeling somewhat weak but worse yesterday.  She states she feels that she has no energy currently.  She denies fever.  Does have some intermittent pains in her left thigh from the surgery.  Denies chest pain or SOB.  She last had full dialysis on Friday afternoon.  States she was supposed to go yesterday but she left for her appt late so they could not do it.  She went in again this morning to make up her treatment but staff there sent her here due to her weakness.  Patient currently at [facility].  States she has re-started all of her usual medications, taking them as directed.  Past Medical History:  Diagnosis Date  . Anemia   . Atrial fibrillation ([place])   . Complication of anesthesia    Morphine makes her nauseated, states she has small veins  . Constipation   . CVA (cerebral infarction)    right parietal 05/19/2000  . DM (diabetes mellitus) type II controlled peripheral vascular disorder ([place])   . ESRD (end stage renal disease) ([place])    dialysis M/W/F  . GERD (gastroesophageal reflux disease)   . Hyperparathyroidism   . Hypertension   . Peripheral vascular disease ([place])   . Pneumonia   . Slip/trip w/o falling due to stepping on object, subs July  2014  . Stroke ([place])    2002,no residual  . Vaginal bleeding   . Wound infection, posttraumatic ([place])    left BKA     Patient Active Problem List   Diagnosis Date Noted  . Atherosclerosis of lower extremity with gangrene ([place]) 11/08/2015  . Atrial fibrillation ([place]) 07/22/2015  . Anemia   . S/P BKA (below knee amputation) unilateral ([place])   . Abnormality of gait   . Paroxysmal atrial fibrillation (HCC)   . History of CVA (cerebrovascular accident)   . Type 2 diabetes mellitus with peripheral neuropathy (HCC)   . ESRD on dialysis ([place])   . Postoperative pain   . SIRS (systemic inflammatory response syndrome) (H[facility])   . Essential hypertension   . Acute blood loss anemia   . Anemia of chronic disease   . Nonhealing nonsurgical wound 05/05/2015  . Chronic anticoagulation - Coumadin, CHADS2VASC=5 04/21/2015  . PAF (paroxysmal atrial fibrillation) ([place]) 04/21/2015  . Atrial fibrillation with RVR ([place]) 03/25/2015  . Atrial fibrillation with rapid ventricular response ([place]) 03/25/2015  . PAD (peripheral artery disease) ([place]) 01/11/2015  . Pleural effusion, right 08/16/2014  . Abdominal pain 08/16/2014  . Chest pain   . Cholecystitis   . Hypoxia   . 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 Ulcer of lower limb ([place]) 09/09/2013  . Pain in limb-left foot 11/19/2012  . End stage renal disease on dialysis ([place])   . DM (diabetes mellitus) type II controlled peripheral vascular disorder ([place])   .  Peripheral vascular disease, unspecified ([place]) 07/02/2012  . Atherosclerosis of native arteries of the extremities with gangrene ([place]) 05/21/2012  . Hyperparathyroidism, secondary ([place]) 12/28/2010    Past Surgical History:  Procedure Laterality Date  . ABDOMINAL AORTAGRAM N/A 05/27/2012   Procedure: ABDOMINAL Maxcine Ham;  Surgeon: Serafina Mitchell, MD;  Location[facility] CATH LAB;  Service: Cardiovascular;  Laterality: N/A;  . AMPUTATION Left 06/28/2015   Procedure:  Left BELOW KNEE amputation;  Surgeon: Angelia Mould, MD;  Location: [facility];  Service: Vascular;  Laterality: Left;  . AMPUTATION Left 11/08/2015   Procedure:  AMPUTATION ABOVE KNEE / LEFT;  Surgeon: Angelia Mould, MD;  Location: [facility];  Service: Vascular;  Laterality: Left;  . APPLICATION OF WOUND VAC Left 05/05/2015   Procedure: APPLICATION OF WOUND VAC;  Surgeon: Angelia Mould, MD;  Location: [facility];  Service: Vascular;  Laterality: Left;  . AV FISTULA PLACEMENT Right    upper thigh  . BRACHIAL ARTERY GRAFT     x2 for dialysis  . dental extractions Bilateral   . DILATION AND CURETTAGE OF UTERUS  1986  . FOOT SURGERY Left    5 TOES AMPUTATED  . I&D EXTREMITY Left 05/05/2015   Procedure: DEBRIDEMENT HEEL WOUND;  Surgeon: Angelia Mould, MD;  Location: [facility];  Service: Vascular;  Laterality: Left;  . KNEE SURGERY Right X 2  . LEG SURGERY Right   . LOWER EXTREMITY ANGIOGRAM Left 05/27/2012   Procedure: LOWER EXTREMITY ANGIOGRAM;  Surgeon: Serafina Mitchell, MD;  Location: [facility] CATH LAB;  Service: Cardiovascular;  Laterality: Left;  lt leg angio  . parathyroidectomy with autotransplant  12/07/2010  . PERIPHERAL VASCULAR CATHETERIZATION N/A 01/11/2015   Procedure: Lower Extremity Angiography;  Surgeon: Serafina Mitchell, MD;  Location: [facility] CV LAB;  Service: Cardiovascular;  Laterality: N/A;  . TOOTH EXTRACTION  Sep 08, 2014  . TRANSMETATARSAL AMPUTATION Left 07/15/2012   Procedure: TRANSMETATARSAL AMPUTATION;  Surgeon: Angelia Mould, MD;  Location: [facility];  Service: Vascular;  Laterality: Left;    OB History    No data available       Home Medications    Prior to Admission medications   Medication Sig Start Date End Date Taking? Authorizing Provider  acetaminophen (TYLENOL) 325 MG tablet Take 650 mg by mouth every Monday, Wednesday, and Friday. With dialysis    Historical Provider, MD  B Complex-C-Folic Acid (RENA-VITE RX) 1 MG TABS Take 1 mg by mouth daily. 05/26/15   Historical Provider, MD  CALCIUM-VITAMIN D PO Take 1 tablet by mouth 3 (three) times daily with meals.     Historical Provider, MD  Cyanocobalamin  (VITAMIN B-12 PO) Take 1 tablet by mouth daily. Every Sun, Tue, Thu, Sat for supplement on non-Dialysis days    Historical Provider, MD  diazepam (VALIUM) 5 MG tablet Take 1 tablet (5 mg total) by mouth every 8 (eight) hours as needed for muscle spasms. 11/12/15   Serafina Mitchell, MD  diltiazem (CARDIZEM CD) 120 MG 24 hr capsule Take 1 capsule (120 mg total) by mouth daily. 03/27/15   Shanker Kristeen Mans, MD  enoxaparin (LOVENOX) 60 MG/0.6ML injection Inject 0.6 mLs (60 mg total) into the skin daily. 11/11/15   Samantha J Rhyne, PA-C  feeding supplement, GLUCERNA SHAKE, (GLUCERNA SHAKE) LIQD Take 237 mLs by mouth 3 (three) times daily with meals. 11/11/15   Samantha J Rhyne, PA-C  gabapentin (NEURONTIN) 100 MG capsule Take 1 capsule (100 mg total) by mouth  3 (three) times daily. 10/19/15   Sharmon Leyden Nickel, NP  glipiZIDE (GLUCOTROL) 10 MG tablet Take 0.5 tablets (5 mg total) by mouth daily. 11/23/14   Lance Bosch, NP  ibuprofen (ADVIL,MOTRIN) 800 MG tablet Take 800 mg by mouth every 8 (eight) hours as needed for mild pain.     Historical Provider, MD  insulin aspart (NOVOLOG) 100 UNIT/ML injection Inject 3 Units into the skin 3 (three) times daily with meals. 11/11/15   Samantha J Rhyne, PA-C  Nutritional Supplements (FEEDING SUPPLEMENT, NEPRO CARB STEADY,) LIQD Take 237 mLs by mouth daily at 3 pm. 11/11/15   Samantha J Rhyne, PA-C  oxyCODONE (OXY IR/ROXICODONE) 5 MG immediate release tablet Take 1-2 tablets (5-10 mg total) by mouth every 4 (four) hours as needed for moderate pain. 11/12/15   Serafina Mitchell, MD  polyethylene glycol (MIRALAX / Floria Raveling) packet Take 17 g by mouth daily. 08/19/14   Reyne Dumas, MD  traMADol (ULTRAM) 50 MG tablet Take 1-2 tablets (50-100 mg total) by mouth every 6 (six) hours as needed for moderate pain. Reported on 10/19/2015 11/12/15   Serafina Mitchell, MD  vitamin C (ASCORBIC ACID) 500 MG tablet Take 500 mg by mouth daily.    Historical Provider, MD  vitamin E 400 UNIT capsule  Take 400 Units by mouth daily.    Historical Provider, MD  warfarin (COUMADIN) 6 MG tablet Take 6 mg by mouth daily at 6 PM.    Historical Provider, MD    Family History Family History  Problem Relation Age of Onset  . Hypertension Father   . Kidney disease Mother     Social History Social History  Substance Use Topics  . Smoking status: Never Smoker  . Smokeless tobacco: Never Used  . Alcohol use No     Allergies   Doxycycline and Peroxyl [hydrogen peroxide]   Review of Systems Review of Systems  Neurological: Positive for weakness (generalized).  All other systems reviewed and are negative.    Physical Exam Updated Vital Signs LMP  (LMP Unknown) Comment: pt uanble to talk to me  SpO2 98% Comment: RA  Physical Exam  Constitutional: She is oriented to person, place, and time. She appears well-developed and well-nourished.  Appears fatigued and tired  HENT:  Head: Normocephalic and atraumatic.  Mouth/Throat: Oropharynx is clear and moist.  Eyes: Conjunctivae and EOM are normal. Pupils are equal, round, and reactive to light.  Neck: Normal range of motion.  Cardiovascular: Normal rate, regular rhythm and normal heart sounds.   Pulmonary/Chest: Effort normal and breath sounds normal.  Abdominal: Soft. Bowel sounds are normal.  Musculoskeletal: Normal range of motion.  Left AKA stump appears clean, staples remain in place, no surrounding erythema or drainage Old dialysis grafts noted to left upper and lower arm; slight swelling of left arm compared with right Functional dialysis graft to right thigh, thrill present  Neurological: She is alert and oriented to person, place, and time.  AAOx3 although she appears tired, answering questions and following commands appropriately; decreased movement of left leg (AKA) but normal ROM of other 3 extremities normally without ataxia; no facial asymmetry appreciated  Skin: Skin is warm and dry.  Psychiatric: She has a normal mood  and affect.  Nursing note and vitals reviewed.    ED Treatments / Results  Labs (all labs ordered are listed, but only abnormal results are displayed) Labs Reviewed  COMPREHENSIVE METABOLIC PANEL - Abnormal; Notable for the following:  Result Value   Sodium 130 (*)    Potassium 6.5 (*)    Chloride 89 (*)    Glucose, Bld 171 (*)    BUN 94 (*)    Creatinine, Ser 11.04 (*)    Albumin 2.3 (*)    GFR calc non Af Amer 3 (*)    GFR calc Af Amer 4 (*)    Anion gap 16 (*)    All other components within normal limits  CBC WITH DIFFERENTIAL/PLATELET - Abnormal; Notable for the following:    WBC 14.0 (*)    RBC 3.30 (*)    Hemoglobin 9.1 (*)    HCT 28.6 (*)    RDW 18.6 (*)    Neutro Abs 11.4 (*)    All other components within normal limits  PROTIME-INR - Abnormal; Notable for the following:    Prothrombin Time 27.2 (*)    INR 2.56 (*)    All other components within normal limits  CBG MONITORING, ED - Abnormal; Notable for the following:    Glucose-Capillary 187 (*)    All other components within normal limits  CULTURE, BLOOD (ROUTINE X 2)  CULTURE, BLOOD (ROUTINE X 2)  URINE CULTURE  URINALYSIS, ROUTINE W REFLEX MICROSCOPIC (NOT AT [facility])  I-STAT CG4 LACTIC ACID, ED  I-STAT CG4 LACTIC ACID, ED    EKG  EKG Interpretation  Date/Time:  Tuesday November 15 2015 10:53:54 EDT Ventricular Rate:  109 PR Interval:    QRS Duration: 84 QT Interval:  336 QTC Calculation: 453 R Axis:   0 Text Interpretation:  Sinus tachycardia Probable left atrial enlargement Confirmed by Gilford Raid MD, JULIE ([id number]) on 11/15/2015 11:01:03 AM       Radiology Dg Chest Portable 1 View  Result Date: 11/15/2015 CLINICAL DATA:  Lethargy.  Chronic renal failure. EXAM: PORTABLE CHEST 1 VIEW COMPARISON:  March 25, 2015 FINDINGS: There is no edema or consolidation. Heart is borderline enlarged with pulmonary vascularity within normal limits. No adenopathy. No bone lesions. IMPRESSION: Heart borderline  enlarged, stable.  No edema or consolidation. Electronically Signed   By: Lowella Grip III M.D.   On: 11/15/2015 11:26    Preliminary results by tech - Left Upper Ext. Venous Duplex Completed. No evidence of obvious deep vein thrombosis in the left arm. The superficial veins were not clearly visualized. An occluded AVF was noted, which is known according the patient. Rita Sturdivant, BS, RDMS, RVT  Procedures Procedures (including critical care time)  Medications Ordered in ED Medications - No data to display   Initial Impression / Assessment and Plan / ED Course  I have reviewed the triage vital signs and the nursing notes.  Pertinent labs & imaging results that were available during my care of the patient were reviewed by me and considered in my medical decision making (see chart for details).  Clinical Course   55 year old female here from dialysis center with generalized weakness. Her last dialysis treatment was on Friday. She is afebrile and nontoxic. She did have recent AKA surgery to her left leg, stump appears clean without any signs of infection currently. Labs with leukocytosis noted, however this may be reactive from her recent surgery. Lactate normal.  She does have a hyperkalemia 6.5, however no EKG changes. This is likely from not having dialysis for several days. Her INR is therapeutic. She did have some swelling in her left upper arm so venous duplex completed, no acute DVT found.  Blood cultures pending.  Appears that patient's weakness is  likely due to her need for dialysis.  Will speak with nephrology to see if this can be done here.  1:11 PM Spoke with nephrology, Dr. Jimmy Footman-- feels her dialysis can be done at her OP center since she is not requiring admission today.  Have called her dialysis center ([facility]) -- have space available this afternoon.  Arranged [facility] to transport her ASAP.  Will go back to her SNF after treatment and resume normal  schedule tomorrow.  Discussed plan with patient, she acknowledged understanding and agreed with plan of care.  Return precautions given for new or worsening symptoms.  Case discussed with attending physician, Dr. Gilford Raid, who evaluated patient and agrees with assessment and plan of care.   Final Clinical Impressions(s) / ED Diagnoses   Final diagnoses:  Generalized weakness  ESRD on hemodialysis [facility])  Hyperkalemia    New Prescriptions Discharge Medication List as of 11/15/2015  1:20 PM       Larene Pickett, PA-C 11/15/15 1623    Isla Pence, MD 11/15/15 [phone]

## 2015-11-15 NOTE — ED Notes (Signed)
IV team at bedside 

## 2015-11-15 NOTE — ED Notes (Signed)
Phlebotomy and nurse attempted IV x1  and blood draw unable to obtain.

## 2015-11-15 NOTE — ED Notes (Signed)
Doctor at bedside.

## 2015-11-15 NOTE — Progress Notes (Signed)
Preliminary results by tech - Left Upper Ext. Venous Duplex Completed. No evidence of obvious deep vein thrombosis in the left arm. The superficial veins were not clearly visualized. An occluded AVF was noted, which is known according the patient. [name], BS, RDMS, RVT

## 2015-11-15 NOTE — ED Triage Notes (Signed)
Pt from dialysis center via [facility] with c/o lethargy.  The dialysis center reports this is not normal for pt so they called [facility] who stated the symptoms started yesterday.  Pt was d/c from hospital on 11/15/15 for AKA.  Responds and is alert to voice.  Oriented x 4 with EMS.  Missed dialysis yesterday for unknown reason.  Last dialysis session was Friday.  NAD, A&O.

## 2015-11-15 NOTE — Discharge Instructions (Signed)
Go straight to your dialysis center.  Resume your normal dialysis schedule after today. Follow-up with your primary care doctor. Return here for any new/worsening symptoms.

## 2015-11-15 NOTE — ED Notes (Signed)
2nd phlebotomist at bedside.  

## 2015-11-20 LAB — CULTURE, BLOOD (ROUTINE X 2)
CULTURE: NO GROWTH
Culture: NO GROWTH

## 2015-11-21 DIAGNOSIS — N186 End stage renal disease: Secondary | ICD-10-CM | POA: Diagnosis not present

## 2015-11-21 DIAGNOSIS — E1129 Type 2 diabetes mellitus with other diabetic kidney complication: Secondary | ICD-10-CM | POA: Diagnosis not present

## 2015-11-21 DIAGNOSIS — Z992 Dependence on renal dialysis: Secondary | ICD-10-CM | POA: Diagnosis not present

## 2015-11-22 DIAGNOSIS — D62 Acute posthemorrhagic anemia: Secondary | ICD-10-CM | POA: Insufficient documentation

## 2015-11-22 DIAGNOSIS — E538 Deficiency of other specified B group vitamins: Secondary | ICD-10-CM | POA: Insufficient documentation

## 2015-11-22 DIAGNOSIS — Z89619 Acquired absence of unspecified leg above knee: Secondary | ICD-10-CM | POA: Insufficient documentation

## 2015-11-22 DIAGNOSIS — T879 Unspecified complications of amputation stump: Secondary | ICD-10-CM | POA: Insufficient documentation

## 2015-11-22 DIAGNOSIS — T8781 Dehiscence of amputation stump: Secondary | ICD-10-CM | POA: Insufficient documentation

## 2015-11-22 NOTE — Assessment & Plan Note (Signed)
SNF - cont glipizide5 mg daily and 3u novolog at each meal

## 2015-11-22 NOTE — Assessment & Plan Note (Signed)
SNF - prophlaxed with coumadin, usef for AF

## 2015-11-22 NOTE — Assessment & Plan Note (Signed)
SNF - cont MWF dialysis

## 2015-11-22 NOTE — Assessment & Plan Note (Signed)
SNF - cont 4 times weekly on non dialysis days

## 2015-11-22 NOTE — Assessment & Plan Note (Signed)
SNF - rate controlled with diltiazem;propylaxes with coumadin which will be actively titrate.

## 2015-11-22 NOTE — Assessment & Plan Note (Addendum)
SNF - stable; cont diltiazem 120 mg daily

## 2015-11-22 NOTE — Assessment & Plan Note (Signed)
SNF - d/c Hb 9.3; will f/u CBC

## 2015-11-22 NOTE — Assessment & Plan Note (Signed)
SNF - ad above; cont neurontin 100 mg TID

## 2015-12-01 LAB — PROTIME-INR: Protime: 15.1 seconds — AB (ref 10.0–13.8)

## 2015-12-01 LAB — POCT INR: INR: 1.2 — AB (ref 0.9–1.1)

## 2015-12-06 ENCOUNTER — Encounter: Payer: Self-pay | Admitting: Nephrology

## 2015-12-12 ENCOUNTER — Encounter: Payer: Self-pay | Admitting: Vascular Surgery

## 2015-12-14 ENCOUNTER — Encounter: Payer: Medicare Other | Admitting: Vascular Surgery

## 2015-12-16 ENCOUNTER — Encounter: Payer: Self-pay | Admitting: Vascular Surgery

## 2015-12-16 DIAGNOSIS — L89314 Pressure ulcer of right buttock, stage 4: Secondary | ICD-10-CM | POA: Diagnosis not present

## 2015-12-16 DIAGNOSIS — L97122 Non-pressure chronic ulcer of left thigh with fat layer exposed: Secondary | ICD-10-CM | POA: Diagnosis not present

## 2015-12-16 DIAGNOSIS — L259 Unspecified contact dermatitis, unspecified cause: Secondary | ICD-10-CM | POA: Diagnosis not present

## 2015-12-21 ENCOUNTER — Encounter: Payer: Self-pay | Admitting: Vascular Surgery

## 2015-12-21 ENCOUNTER — Ambulatory Visit (INDEPENDENT_AMBULATORY_CARE_PROVIDER_SITE_OTHER): Payer: Medicare Other | Admitting: Vascular Surgery

## 2015-12-21 ENCOUNTER — Non-Acute Institutional Stay (SKILLED_NURSING_FACILITY): Payer: Medicare Other | Admitting: Adult Health

## 2015-12-21 ENCOUNTER — Encounter: Payer: Self-pay | Admitting: Adult Health

## 2015-12-21 VITALS — BP 143/85 | HR 113 | Temp 99.7°F | Ht 62.0 in | Wt 154.0 lb

## 2015-12-21 DIAGNOSIS — Z89612 Acquired absence of left leg above knee: Secondary | ICD-10-CM | POA: Diagnosis not present

## 2015-12-21 DIAGNOSIS — E1151 Type 2 diabetes mellitus with diabetic peripheral angiopathy without gangrene: Secondary | ICD-10-CM | POA: Diagnosis not present

## 2015-12-21 DIAGNOSIS — T8744 Infection of amputation stump, left lower extremity: Secondary | ICD-10-CM

## 2015-12-21 DIAGNOSIS — I739 Peripheral vascular disease, unspecified: Secondary | ICD-10-CM

## 2015-12-21 DIAGNOSIS — I4891 Unspecified atrial fibrillation: Secondary | ICD-10-CM

## 2015-12-21 DIAGNOSIS — Z992 Dependence on renal dialysis: Secondary | ICD-10-CM | POA: Diagnosis not present

## 2015-12-21 DIAGNOSIS — N186 End stage renal disease: Secondary | ICD-10-CM

## 2015-12-21 NOTE — Progress Notes (Signed)
Patient ID: [name], female   DOB: 1960-09-02, 55 y.o.   MRN: [id number]    Location:   [facility] Room Number: 118-A Place of Service:  SNF (31)   CODE STATUS: Full Code  Allergies  Allergen Reactions  . Doxycycline Itching  . Peroxyl [Hydrogen Peroxide] Hives and Rash    Chief Complaint  Patient presents with  . Medical Management of Chronic Issues    Follow up    HPI:  She is a long term resident of this facility being seen for the management of her chronic illnesses. She does continue with therapy. She remains on hemodialysis three days per week. She is not voicing any complaints at this time. There are no nursing concerns at this time.    Past Medical History:  Diagnosis Date  . Anemia   . Atrial fibrillation ([place])   . Complication of anesthesia    Morphine makes her nauseated, states she has small veins  . Constipation   . CVA (cerebral infarction)    right parietal 05/19/2000  . DM (diabetes mellitus) type II controlled peripheral vascular disorder ([place])   . ESRD (end stage renal disease) ([place])    dialysis M/W/F  . GERD (gastroesophageal reflux disease)   . Hyperparathyroidism   . Hypertension   . Peripheral vascular disease ([place])   . Pneumonia   . Slip/trip w/o falling due to stepping on object, subs July  2014  . Stroke ([place])    2002,no residual  . Vaginal bleeding   . Wound infection, posttraumatic ([place])    left BKA    Past Surgical History:  Procedure Laterality Date  . ABDOMINAL AORTAGRAM N/A 05/27/2012   Procedure: ABDOMINAL Maxcine Ham;  Surgeon: Serafina Mitchell, MD;  Location: [facility] CATH LAB;  Service: Cardiovascular;  Laterality: N/A;  . AMPUTATION Left 06/28/2015   Procedure:  Left BELOW KNEE amputation;  Surgeon: Angelia Mould, MD;  Location: [facility];  Service: Vascular;  Laterality: Left;  . AMPUTATION Left 11/08/2015   Procedure: AMPUTATION ABOVE KNEE / LEFT;  Surgeon: Angelia Mould, MD;  Location: [place] [facility];  Service:  Vascular;  Laterality: Left;  . APPLICATION OF WOUND VAC Left 05/05/2015   Procedure: APPLICATION OF WOUND VAC;  Surgeon: Angelia Mould, MD;  Location: [facility];  Service: Vascular;  Laterality: Left;  . AV FISTULA PLACEMENT Right    upper thigh  . BRACHIAL ARTERY GRAFT     x2 for dialysis  . dental extractions Bilateral   . DILATION AND CURETTAGE OF UTERUS  1986  . FOOT SURGERY Left    5 TOES AMPUTATED  . I&D EXTREMITY Left 05/05/2015   Procedure: DEBRIDEMENT HEEL WOUND;  Surgeon: Angelia Mould, MD;  Location: [facility];  Service: Vascular;  Laterality: Left;  . KNEE SURGERY Right X 2  . LEG SURGERY Right   . LOWER EXTREMITY ANGIOGRAM Left 05/27/2012   Procedure: LOWER EXTREMITY ANGIOGRAM;  Surgeon: Serafina Mitchell, MD;  Location: [facility] CATH LAB;  Service: Cardiovascular;  Laterality: Left;  lt leg angio  . parathyroidectomy with autotransplant  12/07/2010  . PERIPHERAL VASCULAR CATHETERIZATION N/A 01/11/2015   Procedure: Lower Extremity Angiography;  Surgeon: Serafina Mitchell, MD;  Location: [facility] CV LAB;  Service: Cardiovascular;  Laterality: N/A;  . TOOTH EXTRACTION  Sep 08, 2014  . TRANSMETATARSAL AMPUTATION Left 07/15/2012   Procedure: TRANSMETATARSAL AMPUTATION;  Surgeon: Angelia Mould, MD;  Location: [facility];  Service: Vascular;  Laterality: Left;  Social History   Social History  . Marital status: Single    Spouse name: N/A  . Number of children: N/A  . Years of education: N/A   Occupational History  . Not on file.   Social History Main Topics  . Smoking status: Never Smoker  . Smokeless tobacco: Never Used  . Alcohol use No  . Drug use: No  . Sexual activity: No   Other Topics Concern  . Not on file   Social History Narrative  . No narrative on file   Family History  Problem Relation Age of Onset  . Hypertension Father   . Kidney disease Mother       VITAL SIGNS BP 135/79   Pulse 88   Temp 98 F (36.7 C) (Oral)   Resp 19   Ht 5\' 2"   (1.575 m)   Wt 154 lb 2 oz (69.9 kg)   LMP  (LMP Unknown) Comment: pt uanble to talk to me  SpO2 97%   BMI 28.19 kg/m   Patient's Medications  New Prescriptions   No medications on file  Previous Medications   ACETAMINOPHEN (TYLENOL) 325 MG TABLET    Take 650 mg by mouth every Monday, Wednesday, and Friday. With dialysis   ASPIRIN EC 81 MG TABLET    Take 81 mg by mouth daily.   B COMPLEX-C-FOLIC ACID (RENA-VITE RX) 1 MG TABS    Take 1 mg by mouth daily.   CALCIUM-VITAMIN D PO    Take 1 tablet by mouth 3 (three) times daily with meals.    CLOPIDOGREL (PLAVIX) 75 MG TABLET    Take 75 mg by mouth daily.   CYANOCOBALAMIN (VITAMIN B-12 PO)    Take 1 tablet by mouth daily. Every Sun, Tue, Thu, Sat for supplement on non-Dialysis days   DIAZEPAM (VALIUM) 5 MG TABLET    Take 1 tablet (5 mg total) by mouth every 8 (eight) hours as needed for muscle spasms.   DILTIAZEM (CARDIZEM CD) 120 MG 24 HR CAPSULE    Take 1 capsule (120 mg total) by mouth daily.   ENOXAPARIN (LOVENOX) 60 MG/0.6ML INJECTION    Inject 0.6 mLs (60 mg total) into the skin daily.   FEEDING SUPPLEMENT, GLUCERNA SHAKE, (GLUCERNA SHAKE) LIQD    Take 237 mLs by mouth 3 (three) times daily with meals.   GABAPENTIN (NEURONTIN) 100 MG CAPSULE    Take 1 capsule (100 mg total) by mouth 3 (three) times daily.   GLIPIZIDE (GLUCOTROL) 10 MG TABLET    Take 0.5 tablets (5 mg total) by mouth daily.   IBUPROFEN (ADVIL,MOTRIN) 800 MG TABLET    Take 800 mg by mouth every 8 (eight) hours as needed for mild pain.    INSULIN ASPART (NOVOLOG) 100 UNIT/ML INJECTION    Inject 3 Units into the skin 3 (three) times daily with meals.   NUTRITIONAL SUPPLEMENTS (FEEDING SUPPLEMENT, NEPRO CARB STEADY,) LIQD    Take 237 mLs by mouth daily at 3 pm.   OXYCODONE (OXY IR/ROXICODONE) 5 MG IMMEDIATE RELEASE TABLET    Take 1-2 tablets (5-10 mg total) by mouth every 4 (four) hours as needed for moderate pain.   POLYETHYLENE GLYCOL (MIRALAX / GLYCOLAX) PACKET    Take 17 g  by mouth daily.   TRAMADOL (ULTRAM) 50 MG TABLET    Take 1-2 tablets (50-100 mg total) by mouth every 6 (six) hours as needed for moderate pain. Reported on 10/19/2015   VITAMIN C (ASCORBIC ACID) 500 MG TABLET  Take 500 mg by mouth daily.   VITAMIN E 400 UNIT CAPSULE    Take 400 Units by mouth daily.  Modified Medications   No medications on file  Discontinued Medications   WARFARIN (COUMADIN) 6 MG TABLET    Take 6 mg by mouth daily at 6 PM.     SIGNIFICANT DIAGNOSTIC EXAMS  LABS REVIEWED:   06-28-15: wbc 8.5; hgb 8.8;hct 28.7;mcv 82.9; plt 419; glucose 156; bun 22; creat 6.68; k+ 3.6; na++138; liver normal albumin 2.8 06-30-15: wbc 14.4; hgb 6.9; hct 21.9; mcv 82.0; plt 369; glucose 148; bun 24; creat 7.64; k+ 3.9; na++ 134;  07-02-15: wbc 13.3; hgb 9.4; hct 29.6; mcv 81.8; plt 374  11-08-15: wbc 8.4; hgb 11.1; hct 36.4; mcv 90.5; plt 281; glucose 142; bun 16; creat 7.50; k+ 4.1; na++ 138; liver normal albumin 2.8 11-15-15: wbc 14.0; hgb 9.1; hct 28.6; mcv 86.7; plt 330 glucose 171; bun 94; creat 11.04; k+ 6.5; na++ 130; liver normal albumin 2.3    Review of Systems  Constitutional: Negative for malaise/fatigue.  Respiratory: Negative for cough and shortness of breath.   Cardiovascular: Negative for chest pain, palpitations and leg swelling.  Gastrointestinal: Negative for heartburn, abdominal pain and constipation.  Musculoskeletal: Positive for myalgias. Negative for back pain and joint pain.       Has stump pain is managed    Skin: Negative.   Neurological: Negative for dizziness.  Psychiatric/Behavioral: The patient is not nervous/anxious.     Physical Exam  Constitutional: She is oriented to person, place, and time. No distress.  Eyes: Conjunctivae are normal.  Neck: Neck supple. No JVD present. No thyromegaly present.  Cardiovascular: Normal rate, regular rhythm and intact distal pulses.   Respiratory: Effort normal and breath sounds normal. No respiratory distress. She has no  wheezes.  GI: Soft. Bowel sounds are normal. She exhibits no distension. There is no tenderness.  Musculoskeletal: She exhibits no edema.  Able to move all extremities  Is status post left aka Lymphadenopathy:    She has no cervical adenopathy.  Neurological: She is alert and oriented to person, place, and time.  Skin: Skin is warm and dry. She is not diaphoretic.  Right thigh A/V graph + thrill + bruit   Psychiatric: She has a normal mood and affect.      ASSESSMENT/ PLAN:  1. ESRD: is on hemodialysis three days per week.; takes tylenol 650 mg prior to dialysis; takes renavit   2. Diabetes: will continue glucotrol 5 mg daily will continue novolog 3 units with meals  3. Constipation: will continue miralax daily   4. PAD: is status post left aka; will continue plavix 75 mg daily   5. Afib: heart rate is stable will continue asa 81 mg daily; plavix 75 mg daily and lovenox 60 mg daily is taking Cardizem cd 120 mg daily for rate control  6. Peripheral neuropathy: will continue neurontin 100 mg three times daily  7. Aka stump complication is status post aka: will continue therapy as directed; will continue valium 5 mg every 8 hours as needed for muscle spasms; will continue ultram 50 or 100 mg every 6 hours as needed for pain and will continue oxycodone 5 or 10 mg every 4 hours as needed for pain.   8. CVA: is neurologically stable will continue plavix 75 mg daily and asa 81 mg daily     MD is aware of resident's narcotic use and is in agreement with current plan of care.  We will attempt to wean resident as apropriate   Ok Edwards NP [facility]  Contact [phone] Monday through Friday 8am- 5pm  After hours call [phone]

## 2015-12-21 NOTE — Progress Notes (Signed)
Patient name: [name] MRN: [id number] DOB: 01-30-1961 Sex: female  REASON FOR VISIT: Follow up after left above-the-knee amputation  HPI: [name] is a 55 y.o. female w as an eye think I got all her staples out may just take a look and see any obvious ones and then under directho underwent a left above-the-knee amputation on 11/08/2015. She had a nonhealing left below-the-knee amputation. She comes in for a one-month follow up visit and staple removal.  She has no specific complaints.  Current Outpatient Prescriptions  Medication Sig Dispense Refill  . acetaminophen (TYLENOL) 325 MG tablet Take 650 mg by mouth every Monday, Wednesday, and Friday. With dialysis    . aspirin EC 81 MG tablet Take 81 mg by mouth daily.    . B Complex-C-Folic Acid (RENA-VITE RX) 1 MG TABS Take 1 mg by mouth daily.  1  . CALCIUM-VITAMIN D PO Take 1 tablet by mouth 3 (three) times daily with meals.     . clopidogrel (PLAVIX) 75 MG tablet Take 75 mg by mouth daily.    . Cyanocobalamin (VITAMIN B-12 PO) Take 1 tablet by mouth daily. Every Sun, Tue, Thu, Sat for supplement on non-Dialysis days    . diazepam (VALIUM) 5 MG tablet Take 1 tablet (5 mg total) by mouth every 8 (eight) hours as needed for muscle spasms. 30 tablet 0  . diltiazem (CARDIZEM CD) 120 MG 24 hr capsule Take 1 capsule (120 mg total) by mouth daily. 60 capsule 0  . enoxaparin (LOVENOX) 60 MG/0.6ML injection Inject 0.6 mLs (60 mg total) into the skin daily. 0 Syringe   . feeding supplement, GLUCERNA SHAKE, (GLUCERNA SHAKE) LIQD Take 237 mLs by mouth 3 (three) times daily with meals.  0  . gabapentin (NEURONTIN) 100 MG capsule Take 1 capsule (100 mg total) by mouth 3 (three) times daily. 60 capsule 1  . glipiZIDE (GLUCOTROL) 10 MG tablet Take 0.5 tablets (5 mg total) by mouth daily. 45 tablet 3  . ibuprofen (ADVIL,MOTRIN) 800 MG tablet Take 800 mg by mouth every 8 (eight) hours as needed for mild pain.     Marland Kitchen insulin aspart (NOVOLOG) 100  UNIT/ML injection Inject 3 Units into the skin 3 (three) times daily with meals.    . Nutritional Supplements (FEEDING SUPPLEMENT, NEPRO CARB STEADY,) LIQD Take 237 mLs by mouth daily at 3 pm.  0  . oxyCODONE (OXY IR/ROXICODONE) 5 MG immediate release tablet Take 1-2 tablets (5-10 mg total) by mouth every 4 (four) hours as needed for moderate pain. 30 tablet 0  . polyethylene glycol (MIRALAX / GLYCOLAX) packet Take 17 g by mouth daily. 14 each 0  . traMADol (ULTRAM) 50 MG tablet Take 1-2 tablets (50-100 mg total) by mouth every 6 (six) hours as needed for moderate pain. Reported on 10/19/2015 30 tablet 0  . vitamin C (ASCORBIC ACID) 500 MG tablet Take 500 mg by mouth daily.    . vitamin E 400 UNIT capsule Take 400 Units by mouth daily.     No current facility-administered medications for this visit.     REVIEW OF SYSTEMS:  [X]  denotes positive finding, [ ]  denotes negative finding Cardiac  Comments:  Chest pain or chest pressure:    Shortness of breath upon exertion:    Short of breath when lying flat:    Irregular heart rhythm:    Constitutional    Fever or chills:      PHYSICAL EXAM: Vitals:   12/21/15 1427  BP: Marland Kitchen)  143/85  Pulse: (!) 113  Temp: 99.7 F (37.6 C)  TempSrc: Oral  SpO2: 100%  Weight: 154 lb (69.9 kg)  Height: 5\' 2"  (1.575 m)    GENERAL: The patient is a well-nourished female, in no acute distress. The vital signs are documented above. CARDIOVASCULAR: There is a regular rate and rhythm. PULMONARY: There is good air exchange bilaterally without wheezing or rales. Her left AKA is healing adequately. Her staples were somewhat intubated and I removed her staples in the office today.  MEDICAL ISSUES:  STATUS POST LEFT AKA: Her left AKA so far is healing adequately. However I have asked the nursing home to do a daily dressing change to try to protect the wound is much as possible. She's had problems with healing in the past. I'll see her back in 2-3 weeks to make  sure we did not miss any staples. It was somewhat difficult to determine his skin was somewhat [place] rated and the staples were added.  Deitra Mayo [facility] [phone]

## 2015-12-22 DIAGNOSIS — L97122 Non-pressure chronic ulcer of left thigh with fat layer exposed: Secondary | ICD-10-CM | POA: Diagnosis not present

## 2015-12-22 DIAGNOSIS — Z992 Dependence on renal dialysis: Secondary | ICD-10-CM | POA: Diagnosis not present

## 2015-12-22 DIAGNOSIS — E1129 Type 2 diabetes mellitus with other diabetic kidney complication: Secondary | ICD-10-CM | POA: Diagnosis not present

## 2015-12-22 DIAGNOSIS — N186 End stage renal disease: Secondary | ICD-10-CM | POA: Diagnosis not present

## 2015-12-22 DIAGNOSIS — L259 Unspecified contact dermatitis, unspecified cause: Secondary | ICD-10-CM | POA: Diagnosis not present

## 2015-12-22 DIAGNOSIS — L89314 Pressure ulcer of right buttock, stage 4: Secondary | ICD-10-CM | POA: Diagnosis not present

## 2015-12-23 DIAGNOSIS — D631 Anemia in chronic kidney disease: Secondary | ICD-10-CM | POA: Diagnosis not present

## 2015-12-23 DIAGNOSIS — N2581 Secondary hyperparathyroidism of renal origin: Secondary | ICD-10-CM | POA: Diagnosis not present

## 2015-12-23 DIAGNOSIS — N186 End stage renal disease: Secondary | ICD-10-CM | POA: Diagnosis not present

## 2015-12-23 DIAGNOSIS — E1129 Type 2 diabetes mellitus with other diabetic kidney complication: Secondary | ICD-10-CM | POA: Diagnosis not present

## 2015-12-26 DIAGNOSIS — N2581 Secondary hyperparathyroidism of renal origin: Secondary | ICD-10-CM | POA: Diagnosis not present

## 2015-12-26 DIAGNOSIS — E1129 Type 2 diabetes mellitus with other diabetic kidney complication: Secondary | ICD-10-CM | POA: Diagnosis not present

## 2015-12-26 DIAGNOSIS — D631 Anemia in chronic kidney disease: Secondary | ICD-10-CM | POA: Diagnosis not present

## 2015-12-26 DIAGNOSIS — N186 End stage renal disease: Secondary | ICD-10-CM | POA: Diagnosis not present

## 2015-12-28 DIAGNOSIS — N2581 Secondary hyperparathyroidism of renal origin: Secondary | ICD-10-CM | POA: Diagnosis not present

## 2015-12-28 DIAGNOSIS — E1129 Type 2 diabetes mellitus with other diabetic kidney complication: Secondary | ICD-10-CM | POA: Diagnosis not present

## 2015-12-28 DIAGNOSIS — D631 Anemia in chronic kidney disease: Secondary | ICD-10-CM | POA: Diagnosis not present

## 2015-12-28 DIAGNOSIS — N186 End stage renal disease: Secondary | ICD-10-CM | POA: Diagnosis not present

## 2015-12-30 DIAGNOSIS — L97122 Non-pressure chronic ulcer of left thigh with fat layer exposed: Secondary | ICD-10-CM | POA: Diagnosis not present

## 2015-12-30 DIAGNOSIS — N2581 Secondary hyperparathyroidism of renal origin: Secondary | ICD-10-CM | POA: Diagnosis not present

## 2015-12-30 DIAGNOSIS — L89314 Pressure ulcer of right buttock, stage 4: Secondary | ICD-10-CM | POA: Diagnosis not present

## 2015-12-30 DIAGNOSIS — E1129 Type 2 diabetes mellitus with other diabetic kidney complication: Secondary | ICD-10-CM | POA: Diagnosis not present

## 2015-12-30 DIAGNOSIS — N186 End stage renal disease: Secondary | ICD-10-CM | POA: Diagnosis not present

## 2015-12-30 DIAGNOSIS — L259 Unspecified contact dermatitis, unspecified cause: Secondary | ICD-10-CM | POA: Diagnosis not present

## 2015-12-30 DIAGNOSIS — D631 Anemia in chronic kidney disease: Secondary | ICD-10-CM | POA: Diagnosis not present

## 2016-01-02 DIAGNOSIS — N186 End stage renal disease: Secondary | ICD-10-CM | POA: Diagnosis not present

## 2016-01-02 DIAGNOSIS — N2581 Secondary hyperparathyroidism of renal origin: Secondary | ICD-10-CM | POA: Diagnosis not present

## 2016-01-02 DIAGNOSIS — D631 Anemia in chronic kidney disease: Secondary | ICD-10-CM | POA: Diagnosis not present

## 2016-01-02 DIAGNOSIS — E1129 Type 2 diabetes mellitus with other diabetic kidney complication: Secondary | ICD-10-CM | POA: Diagnosis not present

## 2016-01-04 DIAGNOSIS — N2581 Secondary hyperparathyroidism of renal origin: Secondary | ICD-10-CM | POA: Diagnosis not present

## 2016-01-04 DIAGNOSIS — N186 End stage renal disease: Secondary | ICD-10-CM | POA: Diagnosis not present

## 2016-01-04 DIAGNOSIS — D631 Anemia in chronic kidney disease: Secondary | ICD-10-CM | POA: Diagnosis not present

## 2016-01-04 DIAGNOSIS — E1129 Type 2 diabetes mellitus with other diabetic kidney complication: Secondary | ICD-10-CM | POA: Diagnosis not present

## 2016-01-05 ENCOUNTER — Encounter: Payer: Self-pay | Admitting: Vascular Surgery

## 2016-01-06 DIAGNOSIS — N2581 Secondary hyperparathyroidism of renal origin: Secondary | ICD-10-CM | POA: Diagnosis not present

## 2016-01-06 DIAGNOSIS — L259 Unspecified contact dermatitis, unspecified cause: Secondary | ICD-10-CM | POA: Diagnosis not present

## 2016-01-06 DIAGNOSIS — N186 End stage renal disease: Secondary | ICD-10-CM | POA: Diagnosis not present

## 2016-01-06 DIAGNOSIS — D631 Anemia in chronic kidney disease: Secondary | ICD-10-CM | POA: Diagnosis not present

## 2016-01-06 DIAGNOSIS — E1129 Type 2 diabetes mellitus with other diabetic kidney complication: Secondary | ICD-10-CM | POA: Diagnosis not present

## 2016-01-06 DIAGNOSIS — L97123 Non-pressure chronic ulcer of left thigh with necrosis of muscle: Secondary | ICD-10-CM | POA: Diagnosis not present

## 2016-01-06 DIAGNOSIS — L89314 Pressure ulcer of right buttock, stage 4: Secondary | ICD-10-CM | POA: Diagnosis not present

## 2016-01-09 DIAGNOSIS — N2581 Secondary hyperparathyroidism of renal origin: Secondary | ICD-10-CM | POA: Diagnosis not present

## 2016-01-09 DIAGNOSIS — D631 Anemia in chronic kidney disease: Secondary | ICD-10-CM | POA: Diagnosis not present

## 2016-01-09 DIAGNOSIS — N186 End stage renal disease: Secondary | ICD-10-CM | POA: Diagnosis not present

## 2016-01-09 DIAGNOSIS — E1129 Type 2 diabetes mellitus with other diabetic kidney complication: Secondary | ICD-10-CM | POA: Diagnosis not present

## 2016-01-11 ENCOUNTER — Ambulatory Visit (INDEPENDENT_AMBULATORY_CARE_PROVIDER_SITE_OTHER): Payer: Medicare Other | Admitting: Vascular Surgery

## 2016-01-11 ENCOUNTER — Encounter: Payer: Self-pay | Admitting: Vascular Surgery

## 2016-01-11 VITALS — BP 121/70 | HR 99 | Ht 62.0 in | Wt 154.0 lb

## 2016-01-11 DIAGNOSIS — I739 Peripheral vascular disease, unspecified: Secondary | ICD-10-CM

## 2016-01-11 DIAGNOSIS — D631 Anemia in chronic kidney disease: Secondary | ICD-10-CM | POA: Diagnosis not present

## 2016-01-11 DIAGNOSIS — N2581 Secondary hyperparathyroidism of renal origin: Secondary | ICD-10-CM | POA: Diagnosis not present

## 2016-01-11 DIAGNOSIS — N186 End stage renal disease: Secondary | ICD-10-CM | POA: Diagnosis not present

## 2016-01-11 DIAGNOSIS — E1129 Type 2 diabetes mellitus with other diabetic kidney complication: Secondary | ICD-10-CM | POA: Diagnosis not present

## 2016-01-11 NOTE — Progress Notes (Signed)
POST OPERATIVE OFFICE NOTE    CC:  F/u for surgery  HPI:  This is a 55 y.o. female who is s/p AKA revised from BKA due to nonhealing stump with peripheral artery disease.  She is at a SNF where they are performing left AKA daily wound care.    Allergies  Allergen Reactions  . Doxycycline Itching  . Peroxyl [Hydrogen Peroxide] Hives and Rash    Current Outpatient Prescriptions  Medication Sig Dispense Refill  . acetaminophen (TYLENOL) 325 MG tablet Take 650 mg by mouth every Monday, Wednesday, and Friday. With dialysis    . aspirin EC 81 MG tablet Take 81 mg by mouth daily.    . B Complex-C-Folic Acid (RENA-VITE RX) 1 MG TABS Take 1 mg by mouth daily.  1  . CALCIUM-VITAMIN D PO Take 1 tablet by mouth 3 (three) times daily with meals.     . clopidogrel (PLAVIX) 75 MG tablet Take 75 mg by mouth daily.    . Cyanocobalamin (VITAMIN B-12 PO) Take 1 tablet by mouth daily. Every Sun, Tue, Thu, Sat for supplement on non-Dialysis days    . diazepam (VALIUM) 5 MG tablet Take 1 tablet (5 mg total) by mouth every 8 (eight) hours as needed for muscle spasms. 30 tablet 0  . diltiazem (CARDIZEM CD) 120 MG 24 hr capsule Take 1 capsule (120 mg total) by mouth daily. 60 capsule 0  . enoxaparin (LOVENOX) 60 MG/0.6ML injection Inject 0.6 mLs (60 mg total) into the skin daily. 0 Syringe   . feeding supplement, GLUCERNA SHAKE, (GLUCERNA SHAKE) LIQD Take 237 mLs by mouth 3 (three) times daily with meals.  0  . gabapentin (NEURONTIN) 100 MG capsule Take 1 capsule (100 mg total) by mouth 3 (three) times daily. 60 capsule 1  . glipiZIDE (GLUCOTROL) 10 MG tablet Take 0.5 tablets (5 mg total) by mouth daily. 45 tablet 3  . ibuprofen (ADVIL,MOTRIN) 800 MG tablet Take 800 mg by mouth every 8 (eight) hours as needed for mild pain.     Marland Kitchen insulin aspart (NOVOLOG) 100 UNIT/ML injection Inject 3 Units into the skin 3 (three) times daily with meals.    . Nutritional Supplements (FEEDING SUPPLEMENT, NEPRO CARB STEADY,)  LIQD Take 237 mLs by mouth daily at 3 pm.  0  . oxyCODONE (OXY IR/ROXICODONE) 5 MG immediate release tablet Take 1-2 tablets (5-10 mg total) by mouth every 4 (four) hours as needed for moderate pain. 30 tablet 0  . polyethylene glycol (MIRALAX / GLYCOLAX) packet Take 17 g by mouth daily. 14 each 0  . traMADol (ULTRAM) 50 MG tablet Take 1-2 tablets (50-100 mg total) by mouth every 6 (six) hours as needed for moderate pain. Reported on 10/19/2015 30 tablet 0  . vitamin C (ASCORBIC ACID) 500 MG tablet Take 500 mg by mouth daily.    . vitamin E 400 UNIT capsule Take 400 Units by mouth daily.     No current facility-administered medications for this visit.      ROS:  See HPI  Physical Exam:  Vitals:   01/11/16 1538  BP: 121/70  Pulse: 99    Incision:  Left AKA incision wound is about 1.5 cm deep and 3 .5 cm wide with clean skin edges.  The is no purulent drainage and no erythema.  Yellow eschar in wound bed.  The stump and thigh are warm to touch and well perfused.   Assessment/Plan:  This is a 55 y.o. female who is s/p: Revision BKA  to AKA.  Recommend continued wound care at the SNF.  Wound packing for yellow eschar debridement.  She will follow up in our office for wound check in 4 weeks with Dr. [name].   Theda Sers [place] Clouse [facility] PA-C [facility] [phone]  Clinic MD:  Pt seen and examined with Dr. [name]

## 2016-01-12 ENCOUNTER — Encounter: Payer: Self-pay | Admitting: Internal Medicine

## 2016-01-12 ENCOUNTER — Non-Acute Institutional Stay (SKILLED_NURSING_FACILITY): Payer: Medicare Other | Admitting: Internal Medicine

## 2016-01-12 DIAGNOSIS — I739 Peripheral vascular disease, unspecified: Secondary | ICD-10-CM | POA: Diagnosis not present

## 2016-01-12 DIAGNOSIS — N186 End stage renal disease: Secondary | ICD-10-CM | POA: Diagnosis not present

## 2016-01-12 DIAGNOSIS — L89314 Pressure ulcer of right buttock, stage 4: Secondary | ICD-10-CM | POA: Diagnosis not present

## 2016-01-12 DIAGNOSIS — E1142 Type 2 diabetes mellitus with diabetic polyneuropathy: Secondary | ICD-10-CM | POA: Diagnosis not present

## 2016-01-12 DIAGNOSIS — Z992 Dependence on renal dialysis: Secondary | ICD-10-CM

## 2016-01-12 DIAGNOSIS — Z89612 Acquired absence of left leg above knee: Secondary | ICD-10-CM | POA: Diagnosis not present

## 2016-01-12 DIAGNOSIS — L259 Unspecified contact dermatitis, unspecified cause: Secondary | ICD-10-CM | POA: Diagnosis not present

## 2016-01-12 DIAGNOSIS — L97123 Non-pressure chronic ulcer of left thigh with necrosis of muscle: Secondary | ICD-10-CM | POA: Diagnosis not present

## 2016-01-12 LAB — BASIC METABOLIC PANEL: GLUCOSE: 238 mg/dL

## 2016-01-12 NOTE — Progress Notes (Signed)
Patient ID: [name], female   DOB: 23-Oct-1960, 55 y.o.   MRN: [id number]    Location:  [place] Room Number: 118-A Place of Service:  SNF [phone])  Provider: Granville Lewis, PA-C  PCP: Lance Bosch, NP Patient Care Team: Lance Bosch, NP as PCP - General (Internal Medicine)  Extended Emergency Contact Information Primary Emergency Contact: Feasel,Adolphus Address: [address]          [place], [place] [address] Johnnette Litter of [place] Phone: [phone] Relation: Father Secondary Emergency Contact: Darletta Moll of [place] Phone: [phone] Mobile Phone: [phone] Relation: Sister  Code Status: Full Code Goals of care:  Advanced Directive information Advanced Directives 12/21/2015  Does patient have an advance directive? No  Would patient like information on creating an advanced directive? No - patient declined information  Pre-existing out of facility DNR order (yellow form or pink MOST form) -     Allergies  Allergen Reactions  . Doxycycline Itching  . Peroxyl [Hydrogen Peroxide] Hives and Rash    Chief Complaint  Patient presents with  . Discharge Note    Discharge from facility  Acute visit to assess possible discharge from facility  HPI:  55 y.o. female  seen today for possible discharge from facility-he was initially admitted to this facility in July 2017 after hospitalization for a left above-the-knee amputation-she had previously received a below the knee amputation back in March 2017 apparently after that she did have a fall at home stop behest and decision was made to undergo a left AKA that apparently she tolerated well she has been here for rehabilitation apparently is slated to go home although there is still some question about that.  She is followed by surgery for her stop and with this thought to be stable she was actually seen it appears yesterday who recommended packing for the  eschar at the site and apparently some plans for debridement  Currently she has no complaints but appears a bit anxious about going home she will be with family.  In regards to other issues she does have a history of end-stage renal disease continues on dialysis 3 times a week.  Is also a type II diabetic blood sugars appear to be stable largely in the 100s occasionally 200s.  In regards to peripheral vascular disease she is on Plavix.        Past Medical History:  Diagnosis Date  . Anemia   . Atrial fibrillation ([place])   . Complication of anesthesia    Morphine makes her nauseated, states she has small veins  . Constipation   . CVA (cerebral infarction)    right parietal 05/19/2000  . DM (diabetes mellitus) type II controlled peripheral vascular disorder ([place])   . ESRD (end stage renal disease) ([place])    dialysis M/W/F  . GERD (gastroesophageal reflux disease)   . Hyperparathyroidism   . Hypertension   . Peripheral vascular disease ([place])   . Pneumonia   . Slip/trip w/o falling due to stepping on object, subs July  2014  . Stroke ([place])    2002,no residual  . Vaginal bleeding   . Wound infection, posttraumatic ([place])    left BKA    Past Surgical History:  Procedure Laterality Date  . ABDOMINAL AORTAGRAM N/A 05/27/2012   Procedure: ABDOMINAL Maxcine Ham;  Surgeon: Serafina Mitchell, MD;  Location: [facility] CATH LAB;  Service: Cardiovascular;  Laterality: N/A;  . AMPUTATION Left 06/28/2015   Procedure:  Left BELOW KNEE amputation;  Surgeon: Angelia Mould, MD;  Location: [place];  Service: Vascular;  Laterality: Left;  . AMPUTATION Left 11/08/2015   Procedure: AMPUTATION ABOVE KNEE / LEFT;  Surgeon: Angelia Mould, MD;  Location: [place];  Service: Vascular;  Laterality: Left;  . APPLICATION OF WOUND VAC Left 05/05/2015   Procedure: APPLICATION OF WOUND VAC;  Surgeon: Angelia Mould, MD;  Location: [place];  Service: Vascular;  Laterality: Left;  . AV FISTULA PLACEMENT  Right    upper thigh  . BRACHIAL ARTERY GRAFT     x2 for dialysis  . dental extractions Bilateral   . DILATION AND CURETTAGE OF UTERUS  1986  . FOOT SURGERY Left    5 TOES AMPUTATED  . I&D EXTREMITY Left 05/05/2015   Procedure: DEBRIDEMENT HEEL WOUND;  Surgeon: Angelia Mould, MD;  Location: [place];  Service: Vascular;  Laterality: Left;  . KNEE SURGERY Right X 2  . LEG SURGERY Right   . LOWER EXTREMITY ANGIOGRAM Left 05/27/2012   Procedure: LOWER EXTREMITY ANGIOGRAM;  Surgeon: Serafina Mitchell, MD;  Location: [facility] CATH LAB;  Service: Cardiovascular;  Laterality: Left;  lt leg angio  . parathyroidectomy with autotransplant  12/07/2010  . PERIPHERAL VASCULAR CATHETERIZATION N/A 01/11/2015   Procedure: Lower Extremity Angiography;  Surgeon: Serafina Mitchell, MD;  Location: [place] CV LAB;  Service: Cardiovascular;  Laterality: N/A;  . TOOTH EXTRACTION  Sep 08, 2014  . TRANSMETATARSAL AMPUTATION Left 07/15/2012   Procedure: TRANSMETATARSAL AMPUTATION;  Surgeon: Angelia Mould, MD;  Location: [place];  Service: Vascular;  Laterality: Left;      reports that she has never smoked. She has never used smokeless tobacco. She reports that she does not drink alcohol or use drugs. Social History   Social History  . Marital status: Single    Spouse name: N/A  . Number of children: N/A  . Years of education: N/A   Occupational History  . Not on file.   Social History Main Topics  . Smoking status: Never Smoker  . Smokeless tobacco: Never Used  . Alcohol use No  . Drug use: No  . Sexual activity: No   Other Topics Concern  . Not on file   Social History Narrative  . No narrative on file   Functional Status Survey:    Allergies  Allergen Reactions  . Doxycycline Itching  . Peroxyl [Hydrogen Peroxide] Hives and Rash    Pertinent  Health Maintenance Due  Topic Date Due  . FOOT EXAM  11/02/1972  . OPHTHALMOLOGY EXAM  11/02/1972  . URINE MICROALBUMIN  11/02/1972  . PAP  SMEAR  11/03/1983  . COLONOSCOPY  11/02/2012  . INFLUENZA VACCINE  07/04/2016 (Originally 11/22/2015)  . HEMOGLOBIN A1C  04/11/2016  . MAMMOGRAM  03/07/2017    Medications:   Medication List       Accurate as of 01/12/16 10:54 AM. Always use your most recent med list.          acetaminophen 325 MG tablet Commonly known as:  TYLENOL Take 650 mg by mouth every Monday, Wednesday, and Friday. With dialysis   aspirin EC 81 MG tablet Take 81 mg by mouth daily.   CALCIUM-VITAMIN D PO Take 1 tablet by mouth 3 (three) times daily with meals.   clopidogrel 75 MG tablet Commonly known as:  PLAVIX Take 75 mg by mouth daily.   diazepam 5 MG tablet Commonly known as:  VALIUM Take 1 tablet (  5 mg total) by mouth every 8 (eight) hours as needed for muscle spasms.   diltiazem 120 MG 24 hr capsule Commonly known as:  CARDIZEM CD Take 1 capsule (120 mg total) by mouth daily.   enoxaparin 60 MG/0.6ML injection Commonly known as:  LOVENOX Inject 0.6 mLs (60 mg total) into the skin daily.   feeding supplement (GLUCERNA SHAKE) Liqd Take 237 mLs by mouth 3 (three) times daily with meals.   feeding supplement (NEPRO CARB STEADY) Liqd Take 237 mLs by mouth daily at 3 pm.   gabapentin 100 MG capsule Commonly known as:  NEURONTIN Take 1 capsule (100 mg total) by mouth 3 (three) times daily.   glipiZIDE 5 MG tablet Commonly known as:  GLUCOTROL Take 5 mg by mouth daily before breakfast.   ibuprofen 800 MG tablet Commonly known as:  ADVIL,MOTRIN Take 800 mg by mouth every 8 (eight) hours as needed for mild pain.   insulin aspart 100 UNIT/ML injection Commonly known as:  novoLOG Inject 3 Units into the skin 3 (three) times daily with meals.   oxyCODONE 5 MG immediate release tablet Commonly known as:  Oxy IR/ROXICODONE Take 1-2 tablets (5-10 mg total) by mouth every 4 (four) hours as needed for moderate pain.   polyethylene glycol packet Commonly known as:  MIRALAX / GLYCOLAX Take  17 g by mouth daily.   RENA-VITE RX 1 MG Tabs Take 1 mg by mouth daily.   traMADol 50 MG tablet Commonly known as:  ULTRAM Take 1-2 tablets (50-100 mg total) by mouth every 6 (six) hours as needed for moderate pain. Reported on 10/19/2015   VITAMIN B-12 PO Take 1 tablet by mouth daily. Every Sun, Tue, Thu, Sat for supplement on non-Dialysis days   vitamin C 500 MG tablet Commonly known as:  ASCORBIC ACID Take 500 mg by mouth daily.   vitamin E 400 UNIT capsule Take 400 Units by mouth daily.       Review of Systems  Constitutional: Negative for malaise/fatigue.  Respiratory: Negative for cough and shortness of breath.   Cardiovascular: Negative for chest pain, palpitations and leg swelling.  Gastrointestinal: Negative for heartburn, abdominal pain and constipation.  Musculoskeletal: Positive for myalgias. Negative for back pain and joint pain.       Has stump pain is managed    Skin: Negative.   Neurological: Negative for dizziness.  Psychiatric/Behavioral: The patient is not nervous/not overtly anxious but is a little nervous about going home    Vitals:   01/12/16 1045  BP: (!) 152/75  Pulse: 68  Resp: 18  Temp: (!) 96.7 F (35.9 C)  TempSrc: Oral  SpO2: 96%  Weight: 155 lb 6 oz (70.5 kg)  Height: 5\' 2"  (1.575 m)   Body mass index is 28.42 kg/m. Physical Exam Constitutional: She is oriented to person, place, and time. No distress.  Eyes: Conjunctivae are normal.  Neck: Neck supple. No JVD present. No thyromegaly present.  Cardiovascular: Normal rate, regular rhythm and intact distal pulses.   Respiratory: Effort normal and breath sounds normal. No respiratory distress. She has no wheezes.  GI: Soft. Bowel sounds are normal. She exhibits no distension. There is no tenderness.  Musculoskeletal: She exhibits no edema.  Able to move all extremities  Is status post left aka-stump site is currently covered   y.  Neurological: She is alert and oriented to person,  place, and time.  Skin: Skin is warm and dry. She is not diaphoretic.  Right thigh A/V  graph + thrill + bruit   Psychiatric: She has a normal mood and affect.    Labs reviewed: Basic Metabolic Panel:  Recent Labs  07/01/15 0659  11/09/15 1330 11/10/15 0658 11/11/15 11/11/15 0718 11/15/15 1130  NA 133*  < >  --  133* 136* 136 130*  K 3.8  < >  --  4.7  --  4.8 6.5*  CL 94*  < >  --  94*  --  94* 89*  CO2 24  < >  --  31  --  31 25  GLUCOSE 168*  < >  --  170*  --  179* 171*  BUN 27*  < >  --  16 39* 39* 94*  CREATININE 8.36*  < >  --  6.01* 8.6* 8.58* 11.04*  CALCIUM 8.4*  < >  --  8.5*  --  9.7 9.1  PHOS 4.2  --  6.0*  --   --  3.6  --   < > = values in this interval not displayed. Liver Function Tests:  Recent Labs  06/28/15 0829  11/08/15 1133 11/11/15 0718 11/15/15 1130  AST 20  --  20  --  40  ALT 15  --  16  --  38  ALKPHOS 54  --  54  --  59  BILITOT 0.7  --  0.5  --  0.5  PROT 7.9  --  8.2*  --  7.3  ALBUMIN 2.8*  < > 2.8* 2.1* 2.3*  < > = values in this interval not displayed. No results for input(s): LIPASE, AMYLASE in the last 8760 hours. No results for input(s): AMMONIA in the last 8760 hours. CBC:  Recent Labs  03/25/15 1125  07/27/15 1330  11/10/15 0658 11/11/15 11/11/15 0718 11/15/15 1130  WBC 10.0  < > 6.5  < > 9.1 10.3 10.3 14.0*  NEUTROABS 7.6  --  3.5  --   --   --   --  11.4*  HGB 13.9  < > 12.5  < > 9.7*  --  9.3* 9.1*  HCT 42.7  < > 41.1  < > 31.8*  --  29.9* 28.6*  MCV 93.4  < > 86.2  < > 90.6  --  89.5 86.7  PLT 197  < > 246  < > 218  --  223 330  < > = values in this interval not displayed. Cardiac Enzymes:  Recent Labs  03/25/15 1725 03/25/15 2059 03/26/15 0236  TROPONINI 0.04* 0.04* 0.05*   BNP: Invalid input(s): POCBNP CBG:  Recent Labs  11/12/15 1133 11/12/15 1610 11/15/15 1058  GLUCAP 164* 208* 187*    Procedures and Imaging Studies During Stay: No results found.  Assessment/Plan:    1. ESRD: is on  hemodialysis three days per week.; takes tylenol 650 mg prior to dialysis; takes renavit   2. Diabetes: will continue glucotrol 5 mg daily will continue novolog 3 units with meals-blood sugars appear to run in the 100-sometimes 200 range if patient is discharged will defer to primary care provider at this point will monitor  3. Constipation: will continue miralax daily   4. PAD: is status post left aka; will continue plavix 75 mg daily   5. Afib: heart rate is stable will continue asa 81 mg daily; plavix 75 mg daily and lovenox 60 mg daily is taking Cardizem cd 120 mg daily for rate control  6. Peripheral neuropathy: will continue neurontin 100 mg three times daily  7. Aka stump complication is status post aka: will continue therapy as directed; will continue valium 5 mg every 8 hours as needed for muscle spasms; will continue ultram 50 or 100 mg every 6 hours as needed for pain and will continue oxycodone 5 or 10 mg every 4 hours as needed for pain.  She is being followed by wound care here as well as the surgeon  8. CVA: is neurologically stable will continue plavix 75 mg daily and asa 81 mg daily    Clinically patient appears stable again will need PT and OT if she does go home however there is still some question about insurance issues and she might be staying-this.  [id number] note greater than 30 minutes spent on this discharge summary-greater than 50% of time spent coordinating plan of care for numerous diagnoses   .

## 2016-01-13 DIAGNOSIS — N186 End stage renal disease: Secondary | ICD-10-CM | POA: Diagnosis not present

## 2016-01-13 DIAGNOSIS — N2581 Secondary hyperparathyroidism of renal origin: Secondary | ICD-10-CM | POA: Diagnosis not present

## 2016-01-13 DIAGNOSIS — E1129 Type 2 diabetes mellitus with other diabetic kidney complication: Secondary | ICD-10-CM | POA: Diagnosis not present

## 2016-01-13 DIAGNOSIS — D631 Anemia in chronic kidney disease: Secondary | ICD-10-CM | POA: Diagnosis not present

## 2016-01-16 DIAGNOSIS — N2581 Secondary hyperparathyroidism of renal origin: Secondary | ICD-10-CM | POA: Diagnosis not present

## 2016-01-16 DIAGNOSIS — E1129 Type 2 diabetes mellitus with other diabetic kidney complication: Secondary | ICD-10-CM | POA: Diagnosis not present

## 2016-01-16 DIAGNOSIS — D631 Anemia in chronic kidney disease: Secondary | ICD-10-CM | POA: Diagnosis not present

## 2016-01-16 DIAGNOSIS — N186 End stage renal disease: Secondary | ICD-10-CM | POA: Diagnosis not present

## 2016-01-18 DIAGNOSIS — N186 End stage renal disease: Secondary | ICD-10-CM | POA: Diagnosis not present

## 2016-01-18 DIAGNOSIS — E1129 Type 2 diabetes mellitus with other diabetic kidney complication: Secondary | ICD-10-CM | POA: Diagnosis not present

## 2016-01-18 DIAGNOSIS — D631 Anemia in chronic kidney disease: Secondary | ICD-10-CM | POA: Diagnosis not present

## 2016-01-18 DIAGNOSIS — N2581 Secondary hyperparathyroidism of renal origin: Secondary | ICD-10-CM | POA: Diagnosis not present

## 2016-01-19 DIAGNOSIS — L97123 Non-pressure chronic ulcer of left thigh with necrosis of muscle: Secondary | ICD-10-CM | POA: Diagnosis not present

## 2016-01-19 DIAGNOSIS — L22 Diaper dermatitis: Secondary | ICD-10-CM | POA: Diagnosis not present

## 2016-01-20 DIAGNOSIS — D631 Anemia in chronic kidney disease: Secondary | ICD-10-CM | POA: Diagnosis not present

## 2016-01-20 DIAGNOSIS — N2581 Secondary hyperparathyroidism of renal origin: Secondary | ICD-10-CM | POA: Diagnosis not present

## 2016-01-20 DIAGNOSIS — E1129 Type 2 diabetes mellitus with other diabetic kidney complication: Secondary | ICD-10-CM | POA: Diagnosis not present

## 2016-01-20 DIAGNOSIS — N186 End stage renal disease: Secondary | ICD-10-CM | POA: Diagnosis not present

## 2016-01-21 DIAGNOSIS — N186 End stage renal disease: Secondary | ICD-10-CM | POA: Diagnosis not present

## 2016-01-21 DIAGNOSIS — Z992 Dependence on renal dialysis: Secondary | ICD-10-CM | POA: Diagnosis not present

## 2016-01-21 DIAGNOSIS — E1129 Type 2 diabetes mellitus with other diabetic kidney complication: Secondary | ICD-10-CM | POA: Diagnosis not present

## 2016-01-23 DIAGNOSIS — Z23 Encounter for immunization: Secondary | ICD-10-CM | POA: Diagnosis not present

## 2016-01-23 DIAGNOSIS — S91301D Unspecified open wound, right foot, subsequent encounter: Secondary | ICD-10-CM | POA: Diagnosis not present

## 2016-01-23 DIAGNOSIS — N186 End stage renal disease: Secondary | ICD-10-CM | POA: Diagnosis not present

## 2016-01-24 ENCOUNTER — Non-Acute Institutional Stay (SKILLED_NURSING_FACILITY): Payer: Medicare Other | Admitting: Adult Health

## 2016-01-24 ENCOUNTER — Encounter: Payer: Self-pay | Admitting: Adult Health

## 2016-01-24 DIAGNOSIS — I4891 Unspecified atrial fibrillation: Secondary | ICD-10-CM

## 2016-01-24 DIAGNOSIS — E1151 Type 2 diabetes mellitus with diabetic peripheral angiopathy without gangrene: Secondary | ICD-10-CM

## 2016-01-24 DIAGNOSIS — Z992 Dependence on renal dialysis: Secondary | ICD-10-CM

## 2016-01-24 DIAGNOSIS — Z89519 Acquired absence of unspecified leg below knee: Secondary | ICD-10-CM

## 2016-01-24 DIAGNOSIS — N186 End stage renal disease: Secondary | ICD-10-CM | POA: Diagnosis not present

## 2016-01-24 NOTE — Progress Notes (Signed)
Patient ID: [name], female   DOB: [birth year], 55 y.o.   MRN: [id number]   Location:   [place] Room Number: 118-A Place of Service:  SNF (31)    CODE STATUS: Full Code  Allergies  Allergen Reactions  . Doxycycline Itching  . Peroxyl [Hydrogen Peroxide] Hives and Rash    Chief Complaint  Patient presents with  . Discharge Note    Discharge from facility    HPI:  She is being discharged to home with home health for pt/ot/rn. She will need a standard wheelchair with cushion; elevated leg rests; antitippers; brake extensions; 3:1 commode' shower bench and reacher. She will need her prescriptions to be written and will need to follow up with her pcp.    Past Medical History:  Diagnosis Date  . Anemia   . Atrial fibrillation ([place])   . Complication of anesthesia    Morphine makes her nauseated, states she has small veins  . Constipation   . CVA (cerebral infarction)    right parietal 05/19/2000  . DM (diabetes mellitus) type II controlled peripheral vascular disorder ([place])   . ESRD (end stage renal disease) ([place])    dialysis M/W/F  . GERD (gastroesophageal reflux disease)   . Hyperparathyroidism   . Hypertension   . Peripheral vascular disease ([place])   . Pneumonia   . Slip/trip w/o falling due to stepping on object, subs July  2014  . Stroke ([place])    2002,no residual  . Vaginal bleeding   . Wound infection, posttraumatic    left BKA    Past Surgical History:  Procedure Laterality Date  . ABDOMINAL AORTAGRAM N/A 05/27/2012   Procedure: ABDOMINAL Maxcine Ham;  Surgeon: Serafina Mitchell, MD;  Location: [facility] CATH LAB;  Service: Cardiovascular;  Laterality: N/A;  . AMPUTATION Left 06/28/2015   Procedure:  Left BELOW KNEE amputation;  Surgeon: Angelia Mould, MD;  Location: [place];  Service: Vascular;  Laterality: Left;  . AMPUTATION Left 11/08/2015   Procedure: AMPUTATION ABOVE KNEE / LEFT;  Surgeon: Angelia Mould, MD;  Location: [place];  Service:  Vascular;  Laterality: Left;  . APPLICATION OF WOUND VAC Left 05/05/2015   Procedure: APPLICATION OF WOUND VAC;  Surgeon: Angelia Mould, MD;  Location: [place];  Service: Vascular;  Laterality: Left;  . AV FISTULA PLACEMENT Right    upper thigh  . BRACHIAL ARTERY GRAFT     x2 for dialysis  . dental extractions Bilateral   . DILATION AND CURETTAGE OF UTERUS  1986  . FOOT SURGERY Left    5 TOES AMPUTATED  . I&D EXTREMITY Left 05/05/2015   Procedure: DEBRIDEMENT HEEL WOUND;  Surgeon: Angelia Mould, MD;  Location: [place];  Service: Vascular;  Laterality: Left;  . KNEE SURGERY Right X 2  . LEG SURGERY Right   . LOWER EXTREMITY ANGIOGRAM Left 05/27/2012   Procedure: LOWER EXTREMITY ANGIOGRAM;  Surgeon: Serafina Mitchell, MD;  Location: [facility] CATH LAB;  Service: Cardiovascular;  Laterality: Left;  lt leg angio  . parathyroidectomy with autotransplant  12/07/2010  . PERIPHERAL VASCULAR CATHETERIZATION N/A 01/11/2015   Procedure: Lower Extremity Angiography;  Surgeon: Serafina Mitchell, MD;  Location: [place] CV LAB;  Service: Cardiovascular;  Laterality: N/A;  . TOOTH EXTRACTION  Sep 08, 2014  . TRANSMETATARSAL AMPUTATION Left 07/15/2012   Procedure: TRANSMETATARSAL AMPUTATION;  Surgeon: Angelia Mould, MD;  Location: [place];  Service: Vascular;  Laterality: Left;    Social History  Social History  . Marital status: Single    Spouse name: N/A  . Number of children: N/A  . Years of education: N/A   Occupational History  . Not on file.   Social History Main Topics  . Smoking status: Never Smoker  . Smokeless tobacco: Never Used  . Alcohol use No  . Drug use: No  . Sexual activity: No   Other Topics Concern  . Not on file   Social History Narrative  . No narrative on file   Family History  Problem Relation Age of Onset  . Hypertension Father   . Kidney disease Mother     VITAL SIGNS BP (!) 144/89   Pulse 91   Temp 98.2 F (36.8 C) (Oral)   Resp 18   Ht 5'  2" (1.575 m)   Wt 155 lb 6 oz (70.5 kg)   LMP  (LMP Unknown) Comment: pt uanble to talk to me  SpO2 99%   BMI 28.42 kg/m   Patient's Medications  New Prescriptions   No medications on file  Previous Medications   ACETAMINOPHEN (TYLENOL) 325 MG TABLET    Take 650 mg by mouth every Monday, Wednesday, and Friday. With dialysis   ASPIRIN EC 81 MG TABLET    Take 81 mg by mouth daily.   B COMPLEX-C-FOLIC ACID (RENA-VITE RX) 1 MG TABS    Take 1 mg by mouth daily.   CALCIUM-VITAMIN D PO    Take 1 tablet by mouth 3 (three) times daily with meals.    CLOPIDOGREL (PLAVIX) 75 MG TABLET    Take 75 mg by mouth daily.   CYANOCOBALAMIN (VITAMIN B-12 PO)    Take 1 tablet by mouth daily. Every Sun, Tue, Thu, Sat for supplement on non-Dialysis days   DIAZEPAM (VALIUM) 5 MG TABLET    Take 1 tablet (5 mg total) by mouth every 8 (eight) hours as needed for muscle spasms.   DILTIAZEM (CARDIZEM CD) 120 MG 24 HR CAPSULE    Take 1 capsule (120 mg total) by mouth daily.   FEEDING SUPPLEMENT, GLUCERNA SHAKE, (GLUCERNA SHAKE) LIQD    Take 237 mLs by mouth 3 (three) times daily with meals.   GABAPENTIN (NEURONTIN) 100 MG CAPSULE    Take 1 capsule (100 mg total) by mouth 3 (three) times daily.   GLIPIZIDE (GLUCOTROL) 5 MG TABLET    Take 5 mg by mouth daily before breakfast.   INSULIN ASPART (NOVOLOG) 100 UNIT/ML INJECTION    Inject 3 Units into the skin 3 (three) times daily with meals.   NUTRITIONAL SUPPLEMENTS (FEEDING SUPPLEMENT, NEPRO CARB STEADY,) LIQD    Take 237 mLs by mouth daily at 3 pm.   OXYCODONE (OXY IR/ROXICODONE) 5 MG IMMEDIATE RELEASE TABLET    Take 1-2 tablets (5-10 mg total) by mouth every 4 (four) hours as needed for moderate pain.   POLYETHYLENE GLYCOL (MIRALAX / GLYCOLAX) PACKET    Take 17 g by mouth daily.   PROBIOTIC CAPS    Take 1 capsule by mouth daily.   SULFAMETHOXAZOLE-TRIMETHOPRIM (BACTRIM DS,SEPTRA DS) 800-160 MG TABLET    Take 1 tablet by mouth 2 (two) times daily.   VITAMIN C (ASCORBIC  ACID) 500 MG TABLET    Take 500 mg by mouth daily.   VITAMIN E 400 UNIT CAPSULE    Take 400 Units by mouth daily.  Modified Medications   No medications on file  Discontinued Medications   ENOXAPARIN (LOVENOX) 60 MG/0.6ML INJECTION  Inject 0.6 mLs (60 mg total) into the skin daily.   IBUPROFEN (ADVIL,MOTRIN) 800 MG TABLET    Take 800 mg by mouth every 8 (eight) hours as needed for mild pain.      SIGNIFICANT DIAGNOSTIC EXAMS  LABS REVIEWED:   06-28-15: wbc 8.5; hgb 8.8;hct 28.7;mcv 82.9; plt 419; glucose 156; bun 22; creat 6.68; k+ 3.6; na++138; liver normal albumin 2.8 06-30-15: wbc 14.4; hgb 6.9; hct 21.9; mcv 82.0; plt 369; glucose 148; bun 24; creat 7.64; k+ 3.9; na++ 134;  07-02-15: wbc 13.3; hgb 9.4; hct 29.6; mcv 81.8; plt 374  11-08-15: wbc 8.4; hgb 11.1; hct 36.4; mcv 90.5; plt 281; glucose 142; bun 16; creat 7.50; k+ 4.1; na++ 138; liver normal albumin 2.8 11-15-15: wbc 14.0; hgb 9.1; hct 28.6; mcv 86.7; plt 330 glucose 171; bun 94; creat 11.04; k+ 6.5; na++ 130; liver normal albumin 2.3    Review of Systems  Constitutional: Negative for malaise/fatigue.  Respiratory: Negative for cough and shortness of breath.   Cardiovascular: Negative for chest pain, palpitations and leg swelling.  Gastrointestinal: Negative for heartburn, abdominal pain and constipation.  Musculoskeletal: Positive for myalgias. Negative for back pain and joint pain.       Has stump pain is managed    Skin: Negative.   Neurological: Negative for dizziness.  Psychiatric/Behavioral: The patient is not nervous/anxious.     Physical Exam  Constitutional: She is oriented to person, place, and time. No distress.  Eyes: Conjunctivae are normal.  Neck: Neck supple. No JVD present. No thyromegaly present.  Cardiovascular: Normal rate, regular rhythm and intact distal pulses.   Respiratory: Effort normal and breath sounds normal. No respiratory distress. She has no wheezes.  GI: Soft. Bowel sounds are normal. She  exhibits no distension. There is no tenderness.  Musculoskeletal: She exhibits no edema.  Able to move all extremities  Is status post left aka Lymphadenopathy:    She has no cervical adenopathy.  Neurological: She is alert and oriented to person, place, and time.  Skin: Skin is warm and dry. She is not diaphoretic.  Right thigh A/V graph + thrill + bruit   Psychiatric: She has a normal mood and affect.      ASSESSMENT/ PLAN:   Patient is being discharged with the following home health services:  Pt/ot/rn for evaluate and treat as indicated for gait balance strength adl training and medication management  Patient is being discharged with the following durable medical equipment:  Standard wheelchair with cushion; elevating leg rests; antitippers; brake extensions; to allow patient to maintain current level of independence with her adls which cannot be achieved with a walker; she can self propel; 3:1 commode; shower bench and reacher   Patient has been advised to f/u with their PCP in 1-2 weeks to bring them up to date on their rehab stay.  Social services at facility was responsible for arranging this appointment.  Pt was provided with a 30 day supply of prescriptions for medications and refills must be obtained from their PCP.  For controlled substances, a more limited supply may be provided adequate until PCP appointment only. #10 oxycodone 5 mg tabs and #10 valium 5 mg tabs    Time spent with patient  40  minutes >50% time spent counseling; reviewing medical record; tests; labs; and developing future plan of care    Ok Edwards NP [facility] Adult Medicine  Contact [phone] Monday through Friday 8am- 5pm  After hours call [phone]

## 2016-01-25 DIAGNOSIS — S91301D Unspecified open wound, right foot, subsequent encounter: Secondary | ICD-10-CM | POA: Diagnosis not present

## 2016-01-25 DIAGNOSIS — Z23 Encounter for immunization: Secondary | ICD-10-CM | POA: Diagnosis not present

## 2016-01-25 DIAGNOSIS — N186 End stage renal disease: Secondary | ICD-10-CM | POA: Diagnosis not present

## 2016-01-26 ENCOUNTER — Telehealth: Payer: Self-pay

## 2016-01-26 NOTE — Telephone Encounter (Signed)
rec'd call from Wound Tx Nurse at [facility].  Reported left AKA site has increased drainage with foul odor from open wound mid-incision.  Reported wound was cultured and positive for MRSA.  Stated the nurse practitioner placed pt. on Linezolid 600 mg bid.  Requested appt. For evaluation of wound.  Appt. given for 01/27/16 with NP @ 2:15 PM.  Nurse agreed, and will arrange pt's transportation.

## 2016-01-27 ENCOUNTER — Encounter: Payer: Self-pay | Admitting: Family

## 2016-01-27 ENCOUNTER — Ambulatory Visit (INDEPENDENT_AMBULATORY_CARE_PROVIDER_SITE_OTHER): Payer: Medicare Other | Admitting: Family

## 2016-01-27 VITALS — BP 101/70 | HR 100 | Temp 98.6°F | Resp 16 | Ht 62.0 in | Wt 155.0 lb

## 2016-01-27 DIAGNOSIS — S91301D Unspecified open wound, right foot, subsequent encounter: Secondary | ICD-10-CM | POA: Diagnosis not present

## 2016-01-27 DIAGNOSIS — T8744 Infection of amputation stump, left lower extremity: Secondary | ICD-10-CM

## 2016-01-27 DIAGNOSIS — Z992 Dependence on renal dialysis: Secondary | ICD-10-CM

## 2016-01-27 DIAGNOSIS — N186 End stage renal disease: Secondary | ICD-10-CM

## 2016-01-27 DIAGNOSIS — Z23 Encounter for immunization: Secondary | ICD-10-CM | POA: Diagnosis not present

## 2016-01-27 DIAGNOSIS — Z89612 Acquired absence of left leg above knee: Secondary | ICD-10-CM

## 2016-01-27 DIAGNOSIS — L97123 Non-pressure chronic ulcer of left thigh with necrosis of muscle: Secondary | ICD-10-CM | POA: Diagnosis not present

## 2016-01-27 DIAGNOSIS — L22 Diaper dermatitis: Secondary | ICD-10-CM | POA: Diagnosis not present

## 2016-01-27 NOTE — Progress Notes (Signed)
    Postoperative Visit   History of Present Illness  [name] is a 55 y.o. 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 [name] on 11/08/15 due to nonhealing stump with peripheral artery disease.  She is at a SNF, [facility],  where they are performing left AKA daily wound care.     Pt was last evaluated on 01/11/16 by Dr. [name] and Jerilynn Mages. [employer]. At that time the left AKA incision wound was about 1.5 cm deep and 3 .5 cm wide with clean skin edges.  The was no purulent drainage and no erythema.  Yellow eschar in wound bed.  The stump and thigh were warm to touch and well perfused. Recommended continued wound care at the SNF.  Wound packing for yellow eschar debridement.  She was to follow up in our office for wound check in 4 weeks with Dr. [name].  Pt returns today after phone call to VVS triage nurse yesterday from Wound Tx Nurse at [place].  Reported left AKA site has increased drainage with foul odor from open wound mid-incision.  Reported wound was cultured and positive for MRSA.  Stated the nurse practitioner placed pt. on Linezolid 600 mg bid. Pt denies fever or chills. She reports that her left AKA wound had a "fishy smell, but it disappeared after the antibiotic was started".  She states she has been on the new antibiotic for about 2 days.  She denies any pain at the left AKA stump.  She denies any problems with her right lower extremity.  She dialyzed this morning, M-W-F, via right thigh access.   She is on dual antiplatelet therapy, she was on coumadin in the past, it is not on her medication list now.     For VQI Use Only  PRE-ADM LIVING: Nursing home  AMB STATUS: Wheelchair  Physical Examination  Vitals:   01/27/16 1416  BP: 101/70  Pulse: 100  Resp: 16  Temp: 98.6 F (37 C)  TempSrc: Oral  SpO2: 99%  Weight: 155 lb (70.3 kg)  Height: 5\' 2"  (1.575 m)   Body mass index is 28.35 kg/m.   Left AKA amputation site wound measures 1 cm x 3  cm x 4.5 cm depth. No erythema, no purulent drainage noted. White topical cream wiped from wound to inspect it, possibly silvadene.   Medical Decision Making  [name] is a 55 y.o. 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 [name] on 11/08/15 due to nonhealing stump with peripheral artery disease.   The left AKA open wound has contracted in size slightly, from 3.5  to 3 cm, but is 4.5 cm deep. No erythema, no purulent drainage.  Dr. Bridgett Larsson spoke with pt, examined her left AKA wound, and probed the wound with a sterile cotton swab.  Dr. Bridgett Larsson advised that silvadene topical be stopped, and instead pack left AKA wound twice daily with sterile NS wet to dry packing and dressing changes, to encourage granualtion.  Antibiotic coverage per nursing facility.  Follow up with Dr. [name] in 2 weeks for left AKA wound re evaluation.    [name], Sharmon Leyden, RN, MSN, FNP-C [facility] Office: [phone]  01/27/2016, 2:24 PM  Clinic MD: Bridgett Larsson

## 2016-01-27 NOTE — Patient Instructions (Signed)
Peripheral Vascular Disease Peripheral vascular disease (PVD) is a disease of the blood vessels that are not part of your heart and brain. A simple term for PVD is poor circulation. In most cases, PVD narrows the blood vessels that carry blood from your heart to the rest of your body. This can result in a decreased supply of blood to your arms, legs, and internal organs, like your stomach or kidneys. However, it most often affects a person's lower legs and feet. There are two types of PVD.  Organic PVD. This is the more common type. It is caused by damage to the structure of blood vessels.  Functional PVD. This is caused by conditions that make blood vessels contract and tighten (spasm). Without treatment, PVD tends to get worse over time. PVD can also lead to acute ischemic limb. This is when an arm or limb suddenly has trouble getting enough blood. This is a medical emergency. CAUSES Each type of PVD has many different causes. The most common cause of PVD is buildup of a fatty material (plaque) inside of your arteries (atherosclerosis). Small amounts of plaque can break off from the walls of the blood vessels and become lodged in a smaller artery. This blocks blood flow and can cause acute ischemic limb. Other common causes of PVD include:  Blood clots that form inside of blood vessels.  Injuries to blood vessels.  Diseases that cause inflammation of blood vessels or cause blood vessel spasms.  Health behaviors and health history that increase your risk of developing PVD. RISK FACTORS  You may have a greater risk of PVD if you:  Have a family history of PVD.  Have certain medical conditions, including:  High cholesterol.  Diabetes.  High blood pressure (hypertension).  Coronary heart disease.  Past problems with blood clots.  Past injury, such as burns or a broken bone. These may have damaged blood vessels in your limbs.  Buerger disease. This is caused by inflamed blood  vessels in your hands and feet.  Some forms of arthritis.  Rare birth defects that affect the arteries in your legs.  Use tobacco.  Do not get enough exercise.  Are obese.  Are age 50 or older. SIGNS AND SYMPTOMS  PVD may cause many different symptoms. Your symptoms depend on what part of your body is not getting enough blood. Some common signs and symptoms include:  Cramps in your lower legs. This may be a symptom of poor leg circulation (claudication).  Pain and weakness in your legs while you are physically active that goes away when you rest (intermittent claudication).  Leg pain when at rest.  Leg numbness, tingling, or weakness.  Coldness in a leg or foot, especially when compared with the other leg.  Skin or hair changes. These can include:  Hair loss.  Shiny skin.  Pale or bluish skin.  Thick toenails.  Inability to get or maintain an erection (erectile dysfunction). People with PVD are more prone to developing ulcers and sores on their toes, feet, or legs. These may take longer than normal to heal. DIAGNOSIS Your health care provider may diagnose PVD from your signs and symptoms. The health care provider will also do a physical exam. You may have tests to find out what is causing your PVD and determine its severity. Tests may include:  Blood pressure recordings from your arms and legs and measurements of the strength of your pulses (pulse volume recordings).  Imaging studies using sound waves to take pictures of   the blood flow through your blood vessels (Doppler ultrasound).  Injecting a dye into your blood vessels before having imaging studies using:  X-rays (angiogram or arteriogram).  Computer-generated X-rays (CT angiogram).  A powerful electromagnetic field and a computer (magnetic resonance angiogram or MRA). TREATMENT Treatment for PVD depends on the cause of your condition and the severity of your symptoms. It also depends on your age. Underlying  causes need to be treated and controlled. These include long-lasting (chronic) conditions, such as diabetes, high cholesterol, and high blood pressure. You may need to first try making lifestyle changes and taking medicines. Surgery may be needed if these do not work. Lifestyle changes may include:  Quitting smoking.  Exercising regularly.  Following a low-fat, low-cholesterol diet. Medicines may include:  Blood thinners to prevent blood clots.  Medicines to improve blood flow.  Medicines to improve your blood cholesterol levels. Surgical procedures may include:  A procedure that uses an inflated balloon to open a blocked artery and improve blood flow (angioplasty).  A procedure to put in a tube (stent) to keep a blocked artery open (stent implant).  Surgery to reroute blood flow around a blocked artery (peripheral bypass surgery).  Surgery to remove dead tissue from an infected wound on the affected limb.  Amputation. This is surgical removal of the affected limb. This may be necessary in cases of acute ischemic limb that are not improved through medical or surgical treatments. HOME CARE INSTRUCTIONS  Take medicines only as directed by your health care provider.  Do not use any tobacco products, including cigarettes, chewing tobacco, or electronic cigarettes. If you need help quitting, ask your health care provider.  Lose weight if you are overweight, and maintain a healthy weight as directed by your health care provider.  Eat a diet that is low in fat and cholesterol. If you need help, ask your health care provider.  Exercise regularly. Ask your health care provider to suggest some good activities for you.  Use compression stockings or other mechanical devices as directed by your health care provider.  Take good care of your feet.  Wear comfortable shoes that fit well.  Check your feet often for any cuts or sores. SEEK MEDICAL CARE IF:  You have cramps in your legs  while walking.  You have leg pain when you are at rest.  You have coldness in a leg or foot.  Your skin changes.  You have erectile dysfunction.  You have cuts or sores on your feet that are not healing. SEEK IMMEDIATE MEDICAL CARE IF:  Your arm or leg turns cold and blue.  Your arms or legs become red, warm, swollen, painful, or numb.  You have chest pain or trouble breathing.  You suddenly have weakness in your face, arm, or leg.  You become very confused or lose the ability to speak.  You suddenly have a very bad headache or lose your vision.   This information is not intended to replace advice given to you by your health care provider. Make sure you discuss any questions you have with your health care provider.   Document Released: 05/17/2004 Document Revised: 04/30/2014 Document Reviewed: 09/17/2013 Elsevier Interactive Patient Education 2016 [employer]  

## 2016-01-30 DIAGNOSIS — Z23 Encounter for immunization: Secondary | ICD-10-CM | POA: Diagnosis not present

## 2016-01-30 DIAGNOSIS — N186 End stage renal disease: Secondary | ICD-10-CM | POA: Diagnosis not present

## 2016-01-30 DIAGNOSIS — S91301D Unspecified open wound, right foot, subsequent encounter: Secondary | ICD-10-CM | POA: Diagnosis not present

## 2016-02-01 ENCOUNTER — Telehealth: Payer: Self-pay | Admitting: Vascular Surgery

## 2016-02-01 ENCOUNTER — Other Ambulatory Visit: Payer: Self-pay | Admitting: Vascular Surgery

## 2016-02-01 DIAGNOSIS — Z23 Encounter for immunization: Secondary | ICD-10-CM | POA: Diagnosis not present

## 2016-02-01 DIAGNOSIS — S91301D Unspecified open wound, right foot, subsequent encounter: Secondary | ICD-10-CM | POA: Diagnosis not present

## 2016-02-01 DIAGNOSIS — N186 End stage renal disease: Secondary | ICD-10-CM | POA: Diagnosis not present

## 2016-02-01 NOTE — Telephone Encounter (Addendum)
Patient's daughter Diala Firmin called and left a voice msg.  Saying the pt needs a hospital bed that can help to transport her from the bed to her wheelchair.  [phone].  She wants [place] to order it from [place] care.  I called back and left voice msg saying we will contact [facility] agency and try to get an order form for Dr. [name] to sign.  Alverna Fawley E., LPN.   Correction:  It was the patient's nurse at her facility that called [place] and not her daughter.  She ways the patient received a hospital bed already late last night so we can disregard this message.  Shakora Nordquist E. LPN

## 2016-02-02 ENCOUNTER — Telehealth: Payer: Self-pay | Admitting: Vascular Surgery

## 2016-02-02 DIAGNOSIS — E210 Primary hyperparathyroidism: Secondary | ICD-10-CM | POA: Diagnosis not present

## 2016-02-02 DIAGNOSIS — Z89612 Acquired absence of left leg above knee: Secondary | ICD-10-CM | POA: Diagnosis not present

## 2016-02-02 DIAGNOSIS — E1122 Type 2 diabetes mellitus with diabetic chronic kidney disease: Secondary | ICD-10-CM | POA: Diagnosis not present

## 2016-02-02 DIAGNOSIS — E1142 Type 2 diabetes mellitus with diabetic polyneuropathy: Secondary | ICD-10-CM | POA: Diagnosis not present

## 2016-02-02 DIAGNOSIS — Z4800 Encounter for change or removal of nonsurgical wound dressing: Secondary | ICD-10-CM | POA: Diagnosis not present

## 2016-02-02 DIAGNOSIS — Z4781 Encounter for orthopedic aftercare following surgical amputation: Secondary | ICD-10-CM | POA: Diagnosis not present

## 2016-02-02 DIAGNOSIS — N186 End stage renal disease: Secondary | ICD-10-CM | POA: Diagnosis not present

## 2016-02-02 DIAGNOSIS — Z7984 Long term (current) use of oral hypoglycemic drugs: Secondary | ICD-10-CM | POA: Diagnosis not present

## 2016-02-02 DIAGNOSIS — Z992 Dependence on renal dialysis: Secondary | ICD-10-CM | POA: Diagnosis not present

## 2016-02-02 DIAGNOSIS — Z993 Dependence on wheelchair: Secondary | ICD-10-CM | POA: Diagnosis not present

## 2016-02-02 DIAGNOSIS — E1152 Type 2 diabetes mellitus with diabetic peripheral angiopathy with gangrene: Secondary | ICD-10-CM | POA: Diagnosis not present

## 2016-02-02 DIAGNOSIS — I120 Hypertensive chronic kidney disease with stage 5 chronic kidney disease or end stage renal disease: Secondary | ICD-10-CM | POA: Diagnosis not present

## 2016-02-02 DIAGNOSIS — F329 Major depressive disorder, single episode, unspecified: Secondary | ICD-10-CM | POA: Diagnosis not present

## 2016-02-02 DIAGNOSIS — Z8673 Personal history of transient ischemic attack (TIA), and cerebral infarction without residual deficits: Secondary | ICD-10-CM | POA: Diagnosis not present

## 2016-02-02 DIAGNOSIS — I480 Paroxysmal atrial fibrillation: Secondary | ICD-10-CM | POA: Diagnosis not present

## 2016-02-02 DIAGNOSIS — D631 Anemia in chronic kidney disease: Secondary | ICD-10-CM | POA: Diagnosis not present

## 2016-02-02 NOTE — Telephone Encounter (Signed)
Issue was resolved.

## 2016-02-03 DIAGNOSIS — E1152 Type 2 diabetes mellitus with diabetic peripheral angiopathy with gangrene: Secondary | ICD-10-CM | POA: Diagnosis not present

## 2016-02-03 DIAGNOSIS — F329 Major depressive disorder, single episode, unspecified: Secondary | ICD-10-CM | POA: Diagnosis not present

## 2016-02-03 DIAGNOSIS — Z4781 Encounter for orthopedic aftercare following surgical amputation: Secondary | ICD-10-CM | POA: Diagnosis not present

## 2016-02-03 DIAGNOSIS — Z89612 Acquired absence of left leg above knee: Secondary | ICD-10-CM | POA: Diagnosis not present

## 2016-02-03 DIAGNOSIS — E1122 Type 2 diabetes mellitus with diabetic chronic kidney disease: Secondary | ICD-10-CM | POA: Diagnosis not present

## 2016-02-03 DIAGNOSIS — I120 Hypertensive chronic kidney disease with stage 5 chronic kidney disease or end stage renal disease: Secondary | ICD-10-CM | POA: Diagnosis not present

## 2016-02-03 DIAGNOSIS — S91301D Unspecified open wound, right foot, subsequent encounter: Secondary | ICD-10-CM | POA: Diagnosis not present

## 2016-02-03 DIAGNOSIS — Z23 Encounter for immunization: Secondary | ICD-10-CM | POA: Diagnosis not present

## 2016-02-03 DIAGNOSIS — N186 End stage renal disease: Secondary | ICD-10-CM | POA: Diagnosis not present

## 2016-02-06 ENCOUNTER — Encounter: Payer: Self-pay | Admitting: Family

## 2016-02-06 DIAGNOSIS — S91301D Unspecified open wound, right foot, subsequent encounter: Secondary | ICD-10-CM | POA: Diagnosis not present

## 2016-02-06 DIAGNOSIS — N186 End stage renal disease: Secondary | ICD-10-CM | POA: Diagnosis not present

## 2016-02-06 DIAGNOSIS — Z23 Encounter for immunization: Secondary | ICD-10-CM | POA: Diagnosis not present

## 2016-02-07 DIAGNOSIS — Z4781 Encounter for orthopedic aftercare following surgical amputation: Secondary | ICD-10-CM | POA: Diagnosis not present

## 2016-02-07 DIAGNOSIS — E1152 Type 2 diabetes mellitus with diabetic peripheral angiopathy with gangrene: Secondary | ICD-10-CM | POA: Diagnosis not present

## 2016-02-07 DIAGNOSIS — I120 Hypertensive chronic kidney disease with stage 5 chronic kidney disease or end stage renal disease: Secondary | ICD-10-CM | POA: Diagnosis not present

## 2016-02-07 DIAGNOSIS — E1122 Type 2 diabetes mellitus with diabetic chronic kidney disease: Secondary | ICD-10-CM | POA: Diagnosis not present

## 2016-02-07 DIAGNOSIS — F329 Major depressive disorder, single episode, unspecified: Secondary | ICD-10-CM | POA: Diagnosis not present

## 2016-02-07 DIAGNOSIS — Z89612 Acquired absence of left leg above knee: Secondary | ICD-10-CM | POA: Diagnosis not present

## 2016-02-08 DIAGNOSIS — S91301D Unspecified open wound, right foot, subsequent encounter: Secondary | ICD-10-CM | POA: Diagnosis not present

## 2016-02-08 DIAGNOSIS — N186 End stage renal disease: Secondary | ICD-10-CM | POA: Diagnosis not present

## 2016-02-08 DIAGNOSIS — Z23 Encounter for immunization: Secondary | ICD-10-CM | POA: Diagnosis not present

## 2016-02-10 ENCOUNTER — Encounter: Payer: Self-pay | Admitting: Family

## 2016-02-10 ENCOUNTER — Ambulatory Visit: Payer: Medicare Other | Admitting: Family

## 2016-02-10 DIAGNOSIS — I120 Hypertensive chronic kidney disease with stage 5 chronic kidney disease or end stage renal disease: Secondary | ICD-10-CM | POA: Diagnosis not present

## 2016-02-10 DIAGNOSIS — Z89612 Acquired absence of left leg above knee: Secondary | ICD-10-CM | POA: Diagnosis not present

## 2016-02-10 DIAGNOSIS — F329 Major depressive disorder, single episode, unspecified: Secondary | ICD-10-CM | POA: Diagnosis not present

## 2016-02-10 DIAGNOSIS — E1122 Type 2 diabetes mellitus with diabetic chronic kidney disease: Secondary | ICD-10-CM | POA: Diagnosis not present

## 2016-02-10 DIAGNOSIS — E1152 Type 2 diabetes mellitus with diabetic peripheral angiopathy with gangrene: Secondary | ICD-10-CM | POA: Diagnosis not present

## 2016-02-10 DIAGNOSIS — Z4781 Encounter for orthopedic aftercare following surgical amputation: Secondary | ICD-10-CM | POA: Diagnosis not present

## 2016-02-10 DIAGNOSIS — Z23 Encounter for immunization: Secondary | ICD-10-CM | POA: Diagnosis not present

## 2016-02-10 DIAGNOSIS — S91301D Unspecified open wound, right foot, subsequent encounter: Secondary | ICD-10-CM | POA: Diagnosis not present

## 2016-02-10 DIAGNOSIS — N186 End stage renal disease: Secondary | ICD-10-CM | POA: Diagnosis not present

## 2016-02-13 DIAGNOSIS — Z23 Encounter for immunization: Secondary | ICD-10-CM | POA: Diagnosis not present

## 2016-02-13 DIAGNOSIS — S91301D Unspecified open wound, right foot, subsequent encounter: Secondary | ICD-10-CM | POA: Diagnosis not present

## 2016-02-13 DIAGNOSIS — N186 End stage renal disease: Secondary | ICD-10-CM | POA: Diagnosis not present

## 2016-02-14 ENCOUNTER — Ambulatory Visit (INDEPENDENT_AMBULATORY_CARE_PROVIDER_SITE_OTHER): Payer: Medicare Other | Admitting: Family

## 2016-02-14 ENCOUNTER — Encounter: Payer: Self-pay | Admitting: Family

## 2016-02-14 VITALS — BP 140/78 | HR 90 | Temp 98.0°F | Resp 16 | Ht 62.0 in | Wt 147.7 lb

## 2016-02-14 DIAGNOSIS — N186 End stage renal disease: Secondary | ICD-10-CM | POA: Diagnosis not present

## 2016-02-14 DIAGNOSIS — T8189XD Other complications of procedures, not elsewhere classified, subsequent encounter: Secondary | ICD-10-CM

## 2016-02-14 DIAGNOSIS — Z89612 Acquired absence of left leg above knee: Secondary | ICD-10-CM | POA: Diagnosis not present

## 2016-02-14 DIAGNOSIS — I779 Disorder of arteries and arterioles, unspecified: Secondary | ICD-10-CM | POA: Diagnosis not present

## 2016-02-14 DIAGNOSIS — E1152 Type 2 diabetes mellitus with diabetic peripheral angiopathy with gangrene: Secondary | ICD-10-CM | POA: Diagnosis not present

## 2016-02-14 DIAGNOSIS — F329 Major depressive disorder, single episode, unspecified: Secondary | ICD-10-CM | POA: Diagnosis not present

## 2016-02-14 DIAGNOSIS — Z4781 Encounter for orthopedic aftercare following surgical amputation: Secondary | ICD-10-CM | POA: Diagnosis not present

## 2016-02-14 DIAGNOSIS — Z992 Dependence on renal dialysis: Secondary | ICD-10-CM

## 2016-02-14 DIAGNOSIS — E1122 Type 2 diabetes mellitus with diabetic chronic kidney disease: Secondary | ICD-10-CM | POA: Diagnosis not present

## 2016-02-14 DIAGNOSIS — I120 Hypertensive chronic kidney disease with stage 5 chronic kidney disease or end stage renal disease: Secondary | ICD-10-CM | POA: Diagnosis not present

## 2016-02-14 NOTE — Patient Instructions (Signed)
Peripheral Vascular Disease Peripheral vascular disease (PVD) is a disease of the blood vessels that are not part of your heart and brain. A simple term for PVD is poor circulation. In most cases, PVD narrows the blood vessels that carry blood from your heart to the rest of your body. This can result in a decreased supply of blood to your arms, legs, and internal organs, like your stomach or kidneys. However, it most often affects a person's lower legs and feet. There are two types of PVD.  Organic PVD. This is the more common type. It is caused by damage to the structure of blood vessels.  Functional PVD. This is caused by conditions that make blood vessels contract and tighten (spasm). Without treatment, PVD tends to get worse over time. PVD can also lead to acute ischemic limb. This is when an arm or limb suddenly has trouble getting enough blood. This is a medical emergency. CAUSES Each type of PVD has many different causes. The most common cause of PVD is buildup of a fatty material (plaque) inside of your arteries (atherosclerosis). Small amounts of plaque can break off from the walls of the blood vessels and become lodged in a smaller artery. This blocks blood flow and can cause acute ischemic limb. Other common causes of PVD include:  Blood clots that form inside of blood vessels.  Injuries to blood vessels.  Diseases that cause inflammation of blood vessels or cause blood vessel spasms.  Health behaviors and health history that increase your risk of developing PVD. RISK FACTORS  You may have a greater risk of PVD if you:  Have a family history of PVD.  Have certain medical conditions, including:  High cholesterol.  Diabetes.  High blood pressure (hypertension).  Coronary heart disease.  Past problems with blood clots.  Past injury, such as burns or a broken bone. These may have damaged blood vessels in your limbs.  Buerger disease. This is caused by inflamed blood  vessels in your hands and feet.  Some forms of arthritis.  Rare birth defects that affect the arteries in your legs.  Use tobacco.  Do not get enough exercise.  Are obese.  Are age 50 or older. SIGNS AND SYMPTOMS  PVD may cause many different symptoms. Your symptoms depend on what part of your body is not getting enough blood. Some common signs and symptoms include:  Cramps in your lower legs. This may be a symptom of poor leg circulation (claudication).  Pain and weakness in your legs while you are physically active that goes away when you rest (intermittent claudication).  Leg pain when at rest.  Leg numbness, tingling, or weakness.  Coldness in a leg or foot, especially when compared with the other leg.  Skin or hair changes. These can include:  Hair loss.  Shiny skin.  Pale or bluish skin.  Thick toenails.  Inability to get or maintain an erection (erectile dysfunction). People with PVD are more prone to developing ulcers and sores on their toes, feet, or legs. These may take longer than normal to heal. DIAGNOSIS Your health care provider may diagnose PVD from your signs and symptoms. The health care provider will also do a physical exam. You may have tests to find out what is causing your PVD and determine its severity. Tests may include:  Blood pressure recordings from your arms and legs and measurements of the strength of your pulses (pulse volume recordings).  Imaging studies using sound waves to take pictures of   the blood flow through your blood vessels (Doppler ultrasound).  Injecting a dye into your blood vessels before having imaging studies using:  X-rays (angiogram or arteriogram).  Computer-generated X-rays (CT angiogram).  A powerful electromagnetic field and a computer (magnetic resonance angiogram or MRA). TREATMENT Treatment for PVD depends on the cause of your condition and the severity of your symptoms. It also depends on your age. Underlying  causes need to be treated and controlled. These include long-lasting (chronic) conditions, such as diabetes, high cholesterol, and high blood pressure. You may need to first try making lifestyle changes and taking medicines. Surgery may be needed if these do not work. Lifestyle changes may include:  Quitting smoking.  Exercising regularly.  Following a low-fat, low-cholesterol diet. Medicines may include:  Blood thinners to prevent blood clots.  Medicines to improve blood flow.  Medicines to improve your blood cholesterol levels. Surgical procedures may include:  A procedure that uses an inflated balloon to open a blocked artery and improve blood flow (angioplasty).  A procedure to put in a tube (stent) to keep a blocked artery open (stent implant).  Surgery to reroute blood flow around a blocked artery (peripheral bypass surgery).  Surgery to remove dead tissue from an infected wound on the affected limb.  Amputation. This is surgical removal of the affected limb. This may be necessary in cases of acute ischemic limb that are not improved through medical or surgical treatments. HOME CARE INSTRUCTIONS  Take medicines only as directed by your health care provider.  Do not use any tobacco products, including cigarettes, chewing tobacco, or electronic cigarettes. If you need help quitting, ask your health care provider.  Lose weight if you are overweight, and maintain a healthy weight as directed by your health care provider.  Eat a diet that is low in fat and cholesterol. If you need help, ask your health care provider.  Exercise regularly. Ask your health care provider to suggest some good activities for you.  Use compression stockings or other mechanical devices as directed by your health care provider.  Take good care of your feet.  Wear comfortable shoes that fit well.  Check your feet often for any cuts or sores. SEEK MEDICAL CARE IF:  You have cramps in your legs  while walking.  You have leg pain when you are at rest.  You have coldness in a leg or foot.  Your skin changes.  You have erectile dysfunction.  You have cuts or sores on your feet that are not healing. SEEK IMMEDIATE MEDICAL CARE IF:  Your arm or leg turns cold and blue.  Your arms or legs become red, warm, swollen, painful, or numb.  You have chest pain or trouble breathing.  You suddenly have weakness in your face, arm, or leg.  You become very confused or lose the ability to speak.  You suddenly have a very bad headache or lose your vision.   This information is not intended to replace advice given to you by your health care provider. Make sure you discuss any questions you have with your health care provider.   Document Released: 05/17/2004 Document Revised: 04/30/2014 Document Reviewed: 09/17/2013 Elsevier Interactive Patient Education 2016 [employer]  

## 2016-02-14 NOTE — Progress Notes (Signed)
[facility] [place]   CC: Follow up peripheral artery occlusive disease, slow healing left AKA stump incision   History of Present Illness [name] is a 55 y.o. 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 [name] on 11/08/15 due to nonhealing stump with peripheral artery disease. She was at a SNF, [facility],  where they were performing left AKA daily wound care.   She now lives in an apt, and [facility] changes wound packing 2x/week.   She returns today for 2 week follow up of left AKA stump wound. Pt denies fever or chills. She denies any problems with her right lower extremity.   Pt was evaluated on 01/11/16 by Dr. [name] and Jerilynn Mages. [employer]. At that time the left AKA incision wound was about 1.5 cm deep and 3 .5 cm wide with clean skin edges.There was no purulent drainage and no erythema. Yellow eschar was in wound bed. The stump and thigh were warm to touch and well perfused. Recommended continued wound care at the SNF. Wound packing for yellow eschar debridement. She was to follow up in our office for wound check in 4 weeks with Dr. [name].  I last evaluated pt on 01/27/16 after phone call to VVS triage nurse yesterday from Wound Tx Nurse at [place] [facility]. Reported left AKA site has increased drainage with foul odor from open wound mid-incision. Reported wound was cultured and positive for MRSA. Stated the nurse practitioner placed pt. on Linezolid 600 mg bid. She reports that her left AKA wound had a "fishy smell, but it disappeared after the antibiotic was started".  She had been on the new antibiotic for about 2 days at that time.  She denied any pain at the left AKA stump.   She dialyzes M-W-F, via right thigh access.   She is on dual antiplatelet therapy, she was on coumadin in the past, it is not on her medication list now.   Pt Diabetic: Yes Pt smoker: non-smoker  Pt meds include: Statin :No Betablocker: no ASA: yes Other  anticoagulants/antiplatelets: Plavix   Past Medical History:  Diagnosis Date  . Anemia   . Atrial fibrillation ([place])   . Complication of anesthesia    Morphine makes her nauseated, states she has small veins  . Constipation   . CVA (cerebral infarction)    right parietal 05/19/2000  . DM (diabetes mellitus) type II controlled peripheral vascular disorder ([place])   . ESRD (end stage renal disease) ([place])    dialysis M/W/F  . GERD (gastroesophageal reflux disease)   . Hyperparathyroidism   . Hypertension   . Peripheral vascular disease ([place])   . Pneumonia   . Slip/trip w/o falling due to stepping on object, subs July  2014  . Stroke ([place])    2002,no residual  . Vaginal bleeding   . Wound infection, posttraumatic    left BKA    Social History Social History  Substance Use Topics  . Smoking status: Never Smoker  . Smokeless tobacco: Never Used  . Alcohol use No    Family History Family History  Problem Relation Age of Onset  . Hypertension Father   . Kidney disease Mother     Past Surgical History:  Procedure Laterality Date  . ABDOMINAL AORTAGRAM N/A 05/27/2012   Procedure: ABDOMINAL Maxcine Ham;  Surgeon: Serafina Mitchell, MD;  Location: [facility] CATH LAB;  Service: Cardiovascular;  Laterality: N/A;  . AMPUTATION Left 06/28/2015   Procedure:  Left BELOW  KNEE amputation;  Surgeon: Angelia Mould, MD;  Location: [place];  Service: Vascular;  Laterality: Left;  . AMPUTATION Left 11/08/2015   Procedure: AMPUTATION ABOVE KNEE / LEFT;  Surgeon: Angelia Mould, MD;  Location: [place];  Service: Vascular;  Laterality: Left;  . APPLICATION OF WOUND VAC Left 05/05/2015   Procedure: APPLICATION OF WOUND VAC;  Surgeon: Angelia Mould, MD;  Location: [place];  Service: Vascular;  Laterality: Left;  . AV FISTULA PLACEMENT Right    upper thigh  . BRACHIAL ARTERY GRAFT     x2 for dialysis  . dental extractions Bilateral   . DILATION AND CURETTAGE OF UTERUS  1986  . FOOT SURGERY  Left    5 TOES AMPUTATED  . I&D EXTREMITY Left 05/05/2015   Procedure: DEBRIDEMENT HEEL WOUND;  Surgeon: Angelia Mould, MD;  Location: [place];  Service: Vascular;  Laterality: Left;  . KNEE SURGERY Right X 2  . LEG SURGERY Right   . LOWER EXTREMITY ANGIOGRAM Left 05/27/2012   Procedure: LOWER EXTREMITY ANGIOGRAM;  Surgeon: Serafina Mitchell, MD;  Location: [facility] CATH LAB;  Service: Cardiovascular;  Laterality: Left;  lt leg angio  . parathyroidectomy with autotransplant  12/07/2010  . PERIPHERAL VASCULAR CATHETERIZATION N/A 01/11/2015   Procedure: Lower Extremity Angiography;  Surgeon: Serafina Mitchell, MD;  Location: [place] CV LAB;  Service: Cardiovascular;  Laterality: N/A;  . TOOTH EXTRACTION  Sep 08, 2014  . TRANSMETATARSAL AMPUTATION Left 07/15/2012   Procedure: TRANSMETATARSAL AMPUTATION;  Surgeon: Angelia Mould, MD;  Location: [place];  Service: Vascular;  Laterality: Left;    Allergies  Allergen Reactions  . Doxycycline Itching  . Peroxyl [Hydrogen Peroxide] Hives and Rash    Current Outpatient Prescriptions  Medication Sig Dispense Refill  . acetaminophen (TYLENOL) 325 MG tablet Take 650 mg by mouth every Monday, Wednesday, and Friday. With dialysis    . aspirin EC 81 MG tablet Take 81 mg by mouth daily.    . B Complex-C-Folic Acid (RENA-VITE RX) 1 MG TABS Take 1 mg by mouth daily.  1  . CALCIUM-VITAMIN D PO Take 1 tablet by mouth 3 (three) times daily with meals.     . clopidogrel (PLAVIX) 75 MG tablet Take 75 mg by mouth daily.    . Cyanocobalamin (VITAMIN B-12 PO) Take 1 tablet by mouth daily. Every Sun, Tue, Thu, Sat for supplement on non-Dialysis days    . diazepam (VALIUM) 5 MG tablet Take 1 tablet (5 mg total) by mouth every 8 (eight) hours as needed for muscle spasms. 30 tablet 0  . diltiazem (CARDIZEM) 120 MG tablet take 1 tablet by mouth ON MONDAYS, WEDNESDAYS, AND FRIDAYS  0  . feeding supplement, GLUCERNA SHAKE, (GLUCERNA SHAKE) LIQD Take 237 mLs by mouth 3  (three) times daily with meals.  0  . gabapentin (NEURONTIN) 100 MG capsule Take 1 capsule (100 mg total) by mouth 3 (three) times daily. 60 capsule 1  . glipiZIDE (GLUCOTROL) 5 MG tablet Take 5 mg by mouth daily before breakfast.    . insulin aspart (NOVOLOG) 100 UNIT/ML injection Inject 3 Units into the skin 3 (three) times daily with meals.    [employer] linezolid (ZYVOX) 600 MG tablet Take 600 mg by mouth 2 (two) times daily.    . Nutritional Supplements (FEEDING SUPPLEMENT, NEPRO CARB STEADY,) LIQD Take 237 mLs by mouth daily at 3 pm.  0  . oxyCODONE (OXY IR/ROXICODONE) 5 MG immediate release tablet Take 1-2 tablets (  5-10 mg total) by mouth every 4 (four) hours as needed for moderate pain. 30 tablet 0  . polyethylene glycol (MIRALAX / GLYCOLAX) packet Take 17 g by mouth daily. 14 each 0  . Probiotic CAPS Take 1 capsule by mouth daily.    . traMADol (ULTRAM) 50 MG tablet Take 1-2 tablets (50-100 mg total) by mouth every 6 (six) hours as needed for moderate pain. Reported on 10/19/2015 30 tablet 0  . vitamin C (ASCORBIC ACID) 500 MG tablet Take 500 mg by mouth daily.    . vitamin E 400 UNIT capsule Take 400 Units by mouth daily.     No current facility-administered medications for this visit.     ROS: See HPI for pertinent positives and negatives.   Physical Examination  Vitals:   02/14/16 1511  BP: 140/78  Pulse: 90  Resp: 16  Temp: 98 F (36.7 C)  TempSrc: Oral  SpO2: 96%  Weight: 147 lb 11.3 oz (67 kg)  Height: 5\' 2"  (1.575 m)   Body mass index is 27.02 kg/m.  General: A&O x 3, WDWN, obese female. Hirsute face. Gait: seated in wheelchair Eyes: PERRLA. Pulmonary: Respirations are non labored, CTAB, good air movement in all fields.  Cardiac: regular rhythm, no detected murmur.         Carotid Bruits Right Left   Negative Negative  Aorta is not palpable. Radial pulses: 1+ palpable                           VASCULAR EXAM: Extremities without ischemic changes, without  Gangrene. Left AKA wound depth is 4 cm, lateral width is 2.5 cm, anterior posterior width is 0.5 cm. No drainage, no foul odor, no erythema. Palpable thrill at right thigh AV graft, no bleeding.                                                                                                                                                        LE Pulses Right Left       FEMORAL  not palpable, pt seated in wheelchair  not palpable, pt seated in wheelchair       POPLITEAL  not palpable  not palpable       POSTERIOR TIBIAL  not palpable  BKA        DORSALIS PEDIS      ANTERIOR TIBIAL not palpable BKA   Abdomen: soft, NT, no palpable masses. Skin: no rashes, no ulcers, see Extremities. Musculoskeletal: no muscle wasting or atrophy, see Extremities.     Neurologic: A&O X 3; Appropriate Affect ; SENSATION: normal; MOTOR FUNCTION:  moving all extremities equally, motor strength 5/5 throughout. Speech is fluent/normal, soft spoken. CN 2-12 intact.    ASSESSMENT: [name] is a 55 y.o. 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 [name] on 11/08/15 due to nonhealing stump with peripheral artery disease. She was at a SNF, [facility],  where they were performing left AKA daily wound care.   She now lives in an apt, and [facility] changes wound packing 2x/week.   She returns today for 2 week follow up of left AKA stump wound. Pt denies fever or chills. She denies any problems with her right lower extremity. She has no pain in the left AKA stump.   She has seen [facility] but cannot proceed due to bleeding she has been having at the stump  Low dose gabapentin was prescribed at an earlier visit, to help with left AKA pain, and to help pt to tolerate worse pain in left AKA with HD. She no longer c/o pain at her left AKA site.  Stump wound depth is 4 cm, lateral width is 2.5 cm, anterior posterior width is 0.5 cm. No drainage, no foul odor.  Stump wound is contracting, no longer has a foul  odor. It appears that she is still taking Zyvox 600 mg bid. HH is performing silver alginate dressing changes 2x/week.  PLAN:  Based on the patient's vascular studies and examination, pt will return to clinic on 02/23/16, see Dr. [name] for evaluation of left stump wound.   I discussed in depth with the patient the nature of atherosclerosis, and emphasized the importance of maximal medical management including strict control of blood pressure, blood glucose, and lipid levels, obtaining regular exercise, and continued cessation of smoking.  The patient is aware that without maximal medical management the underlying atherosclerotic disease process will progress, limiting the benefit of any interventions.  The patient was given information about PAD including signs, symptoms, treatment, what symptoms should prompt the patient to seek immediate medical care, and risk reduction measures to take.  Clemon Chambers, RN, MSN, FNP-C [facility] Phone: [phone]  Clinic MD: Early  02/14/16 3:20 PM

## 2016-02-15 ENCOUNTER — Ambulatory Visit: Payer: Medicare Other | Attending: Family Medicine | Admitting: Family Medicine

## 2016-02-15 ENCOUNTER — Encounter: Payer: Self-pay | Admitting: Family Medicine

## 2016-02-15 VITALS — BP 96/65 | HR 75 | Temp 97.8°F | Ht 62.0 in | Wt 152.0 lb

## 2016-02-15 DIAGNOSIS — Z881 Allergy status to other antibiotic agents status: Secondary | ICD-10-CM | POA: Insufficient documentation

## 2016-02-15 DIAGNOSIS — E1122 Type 2 diabetes mellitus with diabetic chronic kidney disease: Secondary | ICD-10-CM | POA: Diagnosis not present

## 2016-02-15 DIAGNOSIS — D649 Anemia, unspecified: Secondary | ICD-10-CM | POA: Insufficient documentation

## 2016-02-15 DIAGNOSIS — K219 Gastro-esophageal reflux disease without esophagitis: Secondary | ICD-10-CM | POA: Insufficient documentation

## 2016-02-15 DIAGNOSIS — I480 Paroxysmal atrial fibrillation: Secondary | ICD-10-CM | POA: Diagnosis not present

## 2016-02-15 DIAGNOSIS — E213 Hyperparathyroidism, unspecified: Secondary | ICD-10-CM | POA: Insufficient documentation

## 2016-02-15 DIAGNOSIS — Z7902 Long term (current) use of antithrombotics/antiplatelets: Secondary | ICD-10-CM | POA: Diagnosis not present

## 2016-02-15 DIAGNOSIS — Z794 Long term (current) use of insulin: Secondary | ICD-10-CM | POA: Insufficient documentation

## 2016-02-15 DIAGNOSIS — Z89612 Acquired absence of left leg above knee: Secondary | ICD-10-CM | POA: Insufficient documentation

## 2016-02-15 DIAGNOSIS — S91301D Unspecified open wound, right foot, subsequent encounter: Secondary | ICD-10-CM | POA: Diagnosis not present

## 2016-02-15 DIAGNOSIS — N186 End stage renal disease: Secondary | ICD-10-CM | POA: Insufficient documentation

## 2016-02-15 DIAGNOSIS — I10 Essential (primary) hypertension: Secondary | ICD-10-CM | POA: Diagnosis not present

## 2016-02-15 DIAGNOSIS — E1151 Type 2 diabetes mellitus with diabetic peripheral angiopathy without gangrene: Secondary | ICD-10-CM

## 2016-02-15 DIAGNOSIS — T879 Unspecified complications of amputation stump: Secondary | ICD-10-CM

## 2016-02-15 DIAGNOSIS — Z9889 Other specified postprocedural states: Secondary | ICD-10-CM | POA: Diagnosis not present

## 2016-02-15 DIAGNOSIS — I779 Disorder of arteries and arterioles, unspecified: Secondary | ICD-10-CM | POA: Diagnosis not present

## 2016-02-15 DIAGNOSIS — A4902 Methicillin resistant Staphylococcus aureus infection, unspecified site: Secondary | ICD-10-CM | POA: Insufficient documentation

## 2016-02-15 DIAGNOSIS — T8742 Infection of amputation stump, left upper extremity: Secondary | ICD-10-CM | POA: Insufficient documentation

## 2016-02-15 DIAGNOSIS — Z7982 Long term (current) use of aspirin: Secondary | ICD-10-CM | POA: Diagnosis not present

## 2016-02-15 DIAGNOSIS — Z8673 Personal history of transient ischemic attack (TIA), and cerebral infarction without residual deficits: Secondary | ICD-10-CM | POA: Insufficient documentation

## 2016-02-15 DIAGNOSIS — Z888 Allergy status to other drugs, medicaments and biological substances status: Secondary | ICD-10-CM | POA: Diagnosis not present

## 2016-02-15 DIAGNOSIS — I482 Chronic atrial fibrillation: Secondary | ICD-10-CM | POA: Diagnosis not present

## 2016-02-15 DIAGNOSIS — I120 Hypertensive chronic kidney disease with stage 5 chronic kidney disease or end stage renal disease: Secondary | ICD-10-CM | POA: Insufficient documentation

## 2016-02-15 DIAGNOSIS — I739 Peripheral vascular disease, unspecified: Secondary | ICD-10-CM | POA: Insufficient documentation

## 2016-02-15 DIAGNOSIS — E1129 Type 2 diabetes mellitus with other diabetic kidney complication: Secondary | ICD-10-CM | POA: Diagnosis not present

## 2016-02-15 DIAGNOSIS — Z992 Dependence on renal dialysis: Secondary | ICD-10-CM | POA: Insufficient documentation

## 2016-02-15 DIAGNOSIS — Z23 Encounter for immunization: Secondary | ICD-10-CM | POA: Diagnosis not present

## 2016-02-15 LAB — GLUCOSE, POCT (MANUAL RESULT ENTRY): POC GLUCOSE: 142 mg/dL — AB (ref 70–99)

## 2016-02-15 LAB — POCT GLYCOSYLATED HEMOGLOBIN (HGB A1C): Hemoglobin A1C: 7

## 2016-02-15 NOTE — Patient Instructions (Signed)
Diabetes Mellitus and Food It is important for you to manage your blood sugar (glucose) level. Your blood glucose level can be greatly affected by what you eat. Eating healthier foods in the appropriate amounts throughout the day at about the same time each day will help you control your blood glucose level. It can also help slow or prevent worsening of your diabetes mellitus. Healthy eating may even help you improve the level of your blood pressure and reach or maintain a healthy weight.  General recommendations for healthful eating and cooking habits include:  Eating meals and snacks regularly. Avoid going long periods of time without eating to lose weight.  Eating a diet that consists mainly of plant-based foods, such as fruits, vegetables, nuts, legumes, and whole grains.  Using low-heat cooking methods, such as baking, instead of high-heat cooking methods, such as deep frying. Work with your dietitian to make sure you understand how to use the Nutrition Facts information on food labels. HOW CAN FOOD AFFECT ME? Carbohydrates Carbohydrates affect your blood glucose level more than any other type of food. Your dietitian will help you determine how many carbohydrates to eat at each meal and teach you how to count carbohydrates. Counting carbohydrates is important to keep your blood glucose at a healthy level, especially if you are using insulin or taking certain medicines for diabetes mellitus. Alcohol Alcohol can cause sudden decreases in blood glucose (hypoglycemia), especially if you use insulin or take certain medicines for diabetes mellitus. Hypoglycemia can be a life-threatening condition. Symptoms of hypoglycemia (sleepiness, dizziness, and disorientation) are similar to symptoms of having too much alcohol.  If your health care provider has given you approval to drink alcohol, do so in moderation and use the following guidelines:  Women should not have more than one drink per day, and men  should not have more than two drinks per day. One drink is equal to:  12 oz of beer.  5 oz of wine.  1 oz of hard liquor.  Do not drink on an empty stomach.  Keep yourself hydrated. Have water, diet soda, or unsweetened iced tea.  Regular soda, juice, and other mixers might contain a lot of carbohydrates and should be counted. WHAT FOODS ARE NOT RECOMMENDED? As you make food choices, it is important to remember that all foods are not the same. Some foods have fewer nutrients per serving than other foods, even though they might have the same number of calories or carbohydrates. It is difficult to get your body what it needs when you eat foods with fewer nutrients. Examples of foods that you should avoid that are high in calories and carbohydrates but low in nutrients include:  Trans fats (most processed foods list trans fats on the Nutrition Facts label).  Regular soda.  Juice.  Candy.  Sweets, such as cake, pie, doughnuts, and cookies.  Fried foods. WHAT FOODS CAN I EAT? Eat nutrient-rich foods, which will nourish your body and keep you healthy. The food you should eat also will depend on several factors, including:  The calories you need.  The medicines you take.  Your weight.  Your blood glucose level.  Your blood pressure level.  Your cholesterol level. You should eat a variety of foods, including:  Protein.  Lean cuts of meat.  Proteins low in saturated fats, such as fish, egg whites, and beans. Avoid processed meats.  Fruits and vegetables.  Fruits and vegetables that may help control blood glucose levels, such as apples, mangoes, and   yams.  Dairy products.  Choose fat-free or low-fat dairy products, such as milk, yogurt, and cheese.  Grains, bread, pasta, and rice.  Choose whole grain products, such as multigrain bread, whole oats, and brown rice. These foods may help control blood pressure.  Fats.  Foods containing healthful fats, such as nuts,  avocado, olive oil, canola oil, and fish. DOES EVERYONE WITH DIABETES MELLITUS HAVE THE SAME MEAL PLAN? Because every person with diabetes mellitus is different, there is not one meal plan that works for everyone. It is very important that you meet with a dietitian who will help you create a meal plan that is just right for you.   This information is not intended to replace advice given to you by your health care provider. Make sure you discuss any questions you have with your health care provider.   Document Released: 01/04/2005 Document Revised: 04/30/2014 Document Reviewed: 03/06/2013 Elsevier Interactive Patient Education 2016 [employer]  

## 2016-02-15 NOTE — Progress Notes (Signed)
Need refills Just got out of the nursing home 2 weeks ago

## 2016-02-16 DIAGNOSIS — I120 Hypertensive chronic kidney disease with stage 5 chronic kidney disease or end stage renal disease: Secondary | ICD-10-CM | POA: Diagnosis not present

## 2016-02-16 DIAGNOSIS — E1152 Type 2 diabetes mellitus with diabetic peripheral angiopathy with gangrene: Secondary | ICD-10-CM | POA: Diagnosis not present

## 2016-02-16 DIAGNOSIS — Z4781 Encounter for orthopedic aftercare following surgical amputation: Secondary | ICD-10-CM | POA: Diagnosis not present

## 2016-02-16 DIAGNOSIS — Z89612 Acquired absence of left leg above knee: Secondary | ICD-10-CM | POA: Diagnosis not present

## 2016-02-16 DIAGNOSIS — F329 Major depressive disorder, single episode, unspecified: Secondary | ICD-10-CM | POA: Diagnosis not present

## 2016-02-16 DIAGNOSIS — E1122 Type 2 diabetes mellitus with diabetic chronic kidney disease: Secondary | ICD-10-CM | POA: Diagnosis not present

## 2016-02-16 NOTE — Progress Notes (Signed)
Subjective:  Patient ID: [name], female    DOB: Feb 01, 1961  Age: 55 y.o. 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 [name] on 11/08/15 due to nonhealing stump with peripheral artery disease who was discharged from [facility] skilled nursing facility 2 weeks ago and comes in here for follow-up visit. She was previously on Coumadin but she informs me this was switched to aspirin and Plavix at the nursing home when they could not get her "INR right".  She has a left stump wound, cx positive for MRSA for which she was placed on linezolid; seen by vascular surgery yesterday and has home health care with silver alginate dressing 2 times a week.  Today she states her pain is controlled on gabapentin and oxycodone; she feels sleepy as she had dialysis early this morning and came from there for her appointment here. Has no other complaints today.    Past Medical History:  Diagnosis Date  . Anemia   . Atrial fibrillation ([place])   . Complication of anesthesia    Morphine makes her nauseated, states she has small veins  . Constipation   . CVA (cerebral infarction)    right parietal 05/19/2000  . DM (diabetes mellitus) type II controlled peripheral vascular disorder ([place])   . ESRD (end stage renal disease) ([place])    dialysis M/W/F  . GERD (gastroesophageal reflux disease)   . Hyperparathyroidism   . Hypertension   . Peripheral vascular disease ([place])   . Pneumonia   . Slip/trip w/o falling due to stepping on object, subs July  2014  . Stroke ([place])    2002,no residual  . Vaginal bleeding   . Wound infection, posttraumatic    left BKA    Past Surgical History:  Procedure Laterality  Date  . ABDOMINAL AORTAGRAM N/A 05/27/2012   Procedure: ABDOMINAL Maxcine Ham;  Surgeon: Serafina Mitchell, MD;  Location: [facility] C[facility] LAB;  Service: Cardiovascular;  Laterality: N/A;  . AMPUTATION Left 06/28/2015   Procedure:  Left BELOW KNEE amputation;  Surgeon: Angelia Mould, MD;  Location: [place];  Service: Vascular;  Laterality: Left;  . AMPUTATION Left 11/08/2015   Procedure: AMPUTATION ABOVE KNEE / LEFT;  Surgeon: Angelia Mould, MD;  Location: [place];  Service: Vascular;  Laterality: Left;  . APPLICATION OF WOUND VAC Left 05/05/2015   Procedure: APPLICATION OF WOUND VAC;  Surgeon: Angelia Mould, MD;  Location: [place];  Service: Vascular;  Laterality: Left;  . AV FISTULA PLACEMENT Right    upper thigh  . BRACHIAL ARTERY GRAFT     x2 for dialysis  . dental extractions Bilateral   . DILATION AND CURETTAGE OF UTERUS  1986  . FOOT SURGERY Left    5 TOES AMPUTATED  . I&D EXTREMITY Left 05/05/2015   Procedure: DEBRIDEMENT HEEL WOUND;  Surgeon: Angelia Mould, MD;  Location: [place];  Service: Vascular;  Laterality: Left;  . KNEE SURGERY Right X 2  . LEG SURGERY Right   . LOWER EXTREMITY ANGIOGRAM Left 05/27/2012   Procedure: LOWER EXTREMITY ANGIOGRAM;  Surgeon: Serafina Mitchell, MD;  Location: [facility] CATH LAB;  Service: Cardiovascular;  Laterality: Left;  lt leg angio  . parathyroidectomy with autotransplant  12/07/2010  . PERIPHERAL  VASCULAR CATHETERIZATION N/A 01/11/2015   Procedure: Lower Extremity Angiography;  Surgeon: Serafina Mitchell, MD;  Location: [place] CV LAB;  Service: Cardiovascular;  Laterality: N/A;  . TOOTH EXTRACTION  Sep 08, 2014  . TRANSMETATARSAL AMPUTATION Left 07/15/2012   Procedure: TRANSMETATARSAL AMPUTATION;  Surgeon: Angelia Mould, MD;  Location: [place];  Service: Vascular;  Laterality: Left;    Allergies  Allergen Reactions  . Doxycycline Itching  . Peroxyl [Hydrogen Peroxide] Hives and Rash     Outpatient Medications Prior to Visit    Medication Sig Dispense Refill  . aspirin EC 81 MG tablet Take 81 mg by mouth daily.    . B Complex-C-Folic Acid (RENA-VITE RX) 1 MG TABS Take 1 mg by mouth daily.  1  . CALCIUM-VITAMIN D PO Take 1 tablet by mouth 3 (three) times daily with meals.     . clopidogrel (PLAVIX) 75 MG tablet Take 75 mg by mouth daily.    . diazepam (VALIUM) 5 MG tablet Take 1 tablet (5 mg total) by mouth every 8 (eight) hours as needed for muscle spasms. 30 tablet 0  . diltiazem (CARDIZEM) 120 MG tablet take 1 tablet by mouth ON MONDAYS, WEDNESDAYS, AND FRIDAYS  0  . gabapentin (NEURONTIN) 100 MG capsule Take 1 capsule (100 mg total) by mouth 3 (three) times daily. 60 capsule 1  . glipiZIDE (GLUCOTROL) 5 MG tablet Take 5 mg by mouth daily before breakfast.    . linezolid (ZYVOX) 600 MG tablet Take 600 mg by mouth 2 (two) times daily.    . Nutritional Supplements (FEEDING SUPPLEMENT, NEPRO CARB STEADY,) LIQD Take 237 mLs by mouth daily at 3 pm.  0  . polyethylene glycol (MIRALAX / GLYCOLAX) packet Take 17 g by mouth daily. 14 each 0  . acetaminophen (TYLENOL) 325 MG tablet Take 650 mg by mouth every Monday, Wednesday, and Friday. With dialysis    . Cyanocobalamin (VITAMIN B-12 PO) Take 1 tablet by mouth daily. Every Sun, Tue, Thu, Sat for supplement on non-Dialysis days    . feeding supplement, GLUCERNA SHAKE, (GLUCERNA SHAKE) LIQD Take 237 mLs by mouth 3 (three) times daily with meals. (Patient not taking: Reported on 02/15/2016)  0  . oxyCODONE (OXY IR/ROXICODONE) 5 MG immediate release tablet Take 1-2 tablets (5-10 mg total) by mouth every 4 (four) hours as needed for moderate pain. (Patient not taking: Reported on 02/15/2016) 30 tablet 0  . Probiotic CAPS Take 1 capsule by mouth daily.    . traMADol (ULTRAM) 50 MG tablet Take 1-2 tablets (50-100 mg total) by mouth every 6 (six) hours as needed for moderate pain. Reported on 10/19/2015 (Patient not taking: Reported on 02/15/2016) 30 tablet 0  . vitamin C (ASCORBIC  ACID) 500 MG tablet Take 500 mg by mouth daily.    . vitamin E 400 UNIT capsule Take 400 Units by mouth daily.    . insulin aspart (NOVOLOG) 100 UNIT/ML injection Inject 3 Units into the skin 3 (three) times daily with meals. (Patient not taking: Reported on 02/15/2016)     No facility-administered medications prior to visit.     ROS Review of Systems  Objective:  BP 96/65 (BP Location: Right Arm, Patient Position: Sitting, Cuff Size: Large)   Pulse 75   Temp 97.8 F (36.6 C) (Oral)   Ht 5\' 2"  (1.575 m)   Wt 152 lb (68.9 kg)   LMP  (LMP Unknown) Comment: pt uanble to talk to me  SpO2 97%   BMI  27.80 kg/m   BP/Weight 02/15/2016 02/14/2016 [id number]  Systolic BP 96 [id number]  Diastolic BP 65 78 70  Wt. (Lbs) 152 147.71 155  BMI 27.8 27.02 28.35      Physical Exam  Constitutional: She is oriented to person, place, and time. She appears well-developed and well-nourished.  Cardiovascular: Normal rate and normal heart sounds.   No murmur heard. Pulmonary/Chest: Effort normal and breath sounds normal. She has no wheezes. She has no rales. She exhibits no tenderness.  Abdominal: Soft. Bowel sounds are normal. She exhibits no distension and no mass. There is no tenderness.  Musculoskeletal:  Left AKA with stump wound, no foul smell or discharge.  Neurological: She is alert and oriented to person, place, and time.     Lab Results  Component Value Date   HGBA1C 7.0 02/15/2016    Assessment & Plan:   1. DM (diabetes mellitus) type II controlled peripheral vascular disorder (HCC) Controlled with A1c of 7.0 Glycemic control will not be as strict given risk of hypoglycemia due to multiple coomorbidities - Glucose (CBG) - HgB A1c  2. End stage renal disease on dialysis [facility]) Continue hemodialysis on Monday Wednesday Friday as per protocol  3. Paroxysmal atrial fibrillation (HCC) Currently on aspirin and Plavix Notes revealed she was previously on Coumadin followed by the  Coumadin clinic. She has been advised to schedule an appointment with her cardiologist as she will need reassessment for reinstatement of Coumadin if need be  4. Essential hypertension Controlled  5. AKA stump complication (HCC) Left AKA stump infection Continue linezolid Keep appointment with vascular surgery. Representative from [employer] came in to see the patient regarding prosthesis which I informed him would be on hold due to current infection. 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   No orders of the defined types were placed in this encounter.   Follow-up: Return in about 6 weeks (around 03/28/2016) for Follow-up on diabetes mellitus and atrial fibrillation.   Arnoldo Morale MD

## 2016-02-17 DIAGNOSIS — F329 Major depressive disorder, single episode, unspecified: Secondary | ICD-10-CM | POA: Diagnosis not present

## 2016-02-17 DIAGNOSIS — Z23 Encounter for immunization: Secondary | ICD-10-CM | POA: Diagnosis not present

## 2016-02-17 DIAGNOSIS — N186 End stage renal disease: Secondary | ICD-10-CM | POA: Diagnosis not present

## 2016-02-17 DIAGNOSIS — I120 Hypertensive chronic kidney disease with stage 5 chronic kidney disease or end stage renal disease: Secondary | ICD-10-CM | POA: Diagnosis not present

## 2016-02-17 DIAGNOSIS — S91301D Unspecified open wound, right foot, subsequent encounter: Secondary | ICD-10-CM | POA: Diagnosis not present

## 2016-02-17 DIAGNOSIS — Z89612 Acquired absence of left leg above knee: Secondary | ICD-10-CM | POA: Diagnosis not present

## 2016-02-17 DIAGNOSIS — Z4781 Encounter for orthopedic aftercare following surgical amputation: Secondary | ICD-10-CM | POA: Diagnosis not present

## 2016-02-17 DIAGNOSIS — E1122 Type 2 diabetes mellitus with diabetic chronic kidney disease: Secondary | ICD-10-CM | POA: Diagnosis not present

## 2016-02-17 DIAGNOSIS — E1152 Type 2 diabetes mellitus with diabetic peripheral angiopathy with gangrene: Secondary | ICD-10-CM | POA: Diagnosis not present

## 2016-02-20 DIAGNOSIS — N186 End stage renal disease: Secondary | ICD-10-CM | POA: Diagnosis not present

## 2016-02-20 DIAGNOSIS — Z23 Encounter for immunization: Secondary | ICD-10-CM | POA: Diagnosis not present

## 2016-02-20 DIAGNOSIS — S91301D Unspecified open wound, right foot, subsequent encounter: Secondary | ICD-10-CM | POA: Diagnosis not present

## 2016-02-21 ENCOUNTER — Encounter: Payer: Self-pay | Admitting: Vascular Surgery

## 2016-02-21 DIAGNOSIS — Z89612 Acquired absence of left leg above knee: Secondary | ICD-10-CM | POA: Diagnosis not present

## 2016-02-21 DIAGNOSIS — E1129 Type 2 diabetes mellitus with other diabetic kidney complication: Secondary | ICD-10-CM | POA: Diagnosis not present

## 2016-02-21 DIAGNOSIS — I120 Hypertensive chronic kidney disease with stage 5 chronic kidney disease or end stage renal disease: Secondary | ICD-10-CM | POA: Diagnosis not present

## 2016-02-21 DIAGNOSIS — Z4781 Encounter for orthopedic aftercare following surgical amputation: Secondary | ICD-10-CM | POA: Diagnosis not present

## 2016-02-21 DIAGNOSIS — E1122 Type 2 diabetes mellitus with diabetic chronic kidney disease: Secondary | ICD-10-CM | POA: Diagnosis not present

## 2016-02-21 DIAGNOSIS — N186 End stage renal disease: Secondary | ICD-10-CM | POA: Diagnosis not present

## 2016-02-21 DIAGNOSIS — F329 Major depressive disorder, single episode, unspecified: Secondary | ICD-10-CM | POA: Diagnosis not present

## 2016-02-21 DIAGNOSIS — E1152 Type 2 diabetes mellitus with diabetic peripheral angiopathy with gangrene: Secondary | ICD-10-CM | POA: Diagnosis not present

## 2016-02-21 DIAGNOSIS — Z992 Dependence on renal dialysis: Secondary | ICD-10-CM | POA: Diagnosis not present

## 2016-02-22 ENCOUNTER — Ambulatory Visit: Payer: Medicare Other | Admitting: Vascular Surgery

## 2016-02-22 DIAGNOSIS — N186 End stage renal disease: Secondary | ICD-10-CM | POA: Diagnosis not present

## 2016-02-22 DIAGNOSIS — N2581 Secondary hyperparathyroidism of renal origin: Secondary | ICD-10-CM | POA: Diagnosis not present

## 2016-02-22 DIAGNOSIS — D631 Anemia in chronic kidney disease: Secondary | ICD-10-CM | POA: Diagnosis not present

## 2016-02-22 DIAGNOSIS — E1129 Type 2 diabetes mellitus with other diabetic kidney complication: Secondary | ICD-10-CM | POA: Diagnosis not present

## 2016-02-23 ENCOUNTER — Encounter: Payer: Self-pay | Admitting: Vascular Surgery

## 2016-02-23 ENCOUNTER — Ambulatory Visit (INDEPENDENT_AMBULATORY_CARE_PROVIDER_SITE_OTHER): Payer: Medicare Other | Admitting: Vascular Surgery

## 2016-02-23 VITALS — BP 137/79 | HR 101 | Temp 98.0°F | Resp 14 | Ht 62.0 in | Wt 155.0 lb

## 2016-02-23 DIAGNOSIS — Z48812 Encounter for surgical aftercare following surgery on the circulatory system: Secondary | ICD-10-CM

## 2016-02-23 NOTE — Progress Notes (Signed)
Patient name: [name] MRN: [id number] DOB: 1961/04/15 Sex: female  REASON FOR VISIT: Follow up of left above-the-knee amputation.  HPI: [name] is a 55 y.o. female who underwent a previous left below the knee amputation which failed to heal. She fell and injured the wound and developed a full-thickness wound. Ultimately this was not salvageable and she required an above-the-knee amputation on 11/08/2015. She was seen in our office by Vinnie Level Nickel on 02/14/2016. At that time there was a wound in the stump that was 4 cm deep and 2.5 cm x 0.5 cm. There was no significant drainage. At the time she was still on Zyvox 600 mg twice a day. Home health was performing dressing changes with silver alginate.  She comes in for a wound check.  Current Outpatient Prescriptions  Medication Sig Dispense Refill  . acetaminophen (TYLENOL) 325 MG tablet Take 650 mg by mouth every Monday, Wednesday, and Friday. With dialysis    . aspirin EC 81 MG tablet Take 81 mg by mouth daily.    . B Complex-C-Folic Acid (RENA-VITE RX) 1 MG TABS Take 1 mg by mouth daily.  1  . CALCIUM-VITAMIN D PO Take 1 tablet by mouth 3 (three) times daily with meals.     . clopidogrel (PLAVIX) 75 MG tablet Take 75 mg by mouth daily.    . Cyanocobalamin (VITAMIN B-12 PO) Take 1 tablet by mouth daily. Every Sun, Tue, Thu, Sat for supplement on non-Dialysis days    . diazepam (VALIUM) 5 MG tablet Take 1 tablet (5 mg total) by mouth every 8 (eight) hours as needed for muscle spasms. 30 tablet 0  . diltiazem (CARDIZEM) 120 MG tablet take 1 tablet by mouth ON MONDAYS, WEDNESDAYS, AND FRIDAYS  0  . feeding supplement, GLUCERNA SHAKE, (GLUCERNA SHAKE) LIQD Take 237 mLs by mouth 3 (three) times daily with meals.  0  . gabapentin (NEURONTIN) 100 MG capsule Take 1 capsule (100 mg total) by mouth 3 (three) times daily. 60 capsule 1  . glipiZIDE (GLUCOTROL) 5 MG tablet Take 5 mg by mouth daily before breakfast.    . linezolid (ZYVOX) 600  MG tablet Take 600 mg by mouth 2 (two) times daily.    . Nutritional Supplements (FEEDING SUPPLEMENT, NEPRO CARB STEADY,) LIQD Take 237 mLs by mouth daily at 3 pm.  0  . polyethylene glycol (MIRALAX / GLYCOLAX) packet Take 17 g by mouth daily. 14 each 0  . Probiotic CAPS Take 1 capsule by mouth daily.    . vitamin C (ASCORBIC ACID) 500 MG tablet Take 500 mg by mouth daily.    . vitamin E 400 UNIT capsule Take 400 Units by mouth daily.    Marland Kitchen oxyCODONE (OXY IR/ROXICODONE) 5 MG immediate release tablet Take 1-2 tablets (5-10 mg total) by mouth every 4 (four) hours as needed for moderate pain. (Patient not taking: Reported on 02/23/2016) 30 tablet 0  . traMADol (ULTRAM) 50 MG tablet Take 1-2 tablets (50-100 mg total) by mouth every 6 (six) hours as needed for moderate pain. Reported on 10/19/2015 (Patient not taking: Reported on 02/23/2016) 30 tablet 0   No current facility-administered medications for this visit.     REVIEW OF SYSTEMS:  [X]  denotes positive finding, [ ]  denotes negative finding Cardiac  Comments:  Chest pain or chest pressure:    Shortness of breath upon exertion:    Short of breath when lying flat:    Irregular heart rhythm:    Constitutional  Fever or chills:      PHYSICAL EXAM: Vitals:   02/23/16 1521  BP: 137/79  Pulse: (!) 101  Resp: 14  Temp: 98 F (36.7 C)  SpO2: 100%  Weight: 155 lb (70.3 kg)  Height: 5\' 2"  (1.575 m)    GENERAL: The patient is a well-nourished female, in no acute distress. The vital signs are documented above. CARDIOVASCULAR: There is a regular rate and rhythm. PULMONARY: There is good air exchange bilaterally without wheezing or rales. Her left above-the-knee amputation site has almost completely healed. There is a small 1 cm wound at the apex of the stump which is about 2 cm deep. I packed this with a moist 2 x 2.  MEDICAL ISSUES:  STATUS POST LEFT ABOVE-THE-KNEE AMPUTATION: The patient's indication site has now almost completely  healed. I'll see her back in 6 weeks to be sure. We'll then need to follow her right leg closely given her peripheral vascular disease. Currently she has no wounds on her right foot.  Deitra Mayo [facility] [place] [phone]

## 2016-02-24 DIAGNOSIS — E1129 Type 2 diabetes mellitus with other diabetic kidney complication: Secondary | ICD-10-CM | POA: Diagnosis not present

## 2016-02-24 DIAGNOSIS — D631 Anemia in chronic kidney disease: Secondary | ICD-10-CM | POA: Diagnosis not present

## 2016-02-24 DIAGNOSIS — N2581 Secondary hyperparathyroidism of renal origin: Secondary | ICD-10-CM | POA: Diagnosis not present

## 2016-02-24 DIAGNOSIS — N186 End stage renal disease: Secondary | ICD-10-CM | POA: Diagnosis not present

## 2016-02-27 DIAGNOSIS — E1129 Type 2 diabetes mellitus with other diabetic kidney complication: Secondary | ICD-10-CM | POA: Diagnosis not present

## 2016-02-27 DIAGNOSIS — D631 Anemia in chronic kidney disease: Secondary | ICD-10-CM | POA: Diagnosis not present

## 2016-02-27 DIAGNOSIS — N186 End stage renal disease: Secondary | ICD-10-CM | POA: Diagnosis not present

## 2016-02-27 DIAGNOSIS — N2581 Secondary hyperparathyroidism of renal origin: Secondary | ICD-10-CM | POA: Diagnosis not present

## 2016-02-28 DIAGNOSIS — E1122 Type 2 diabetes mellitus with diabetic chronic kidney disease: Secondary | ICD-10-CM | POA: Diagnosis not present

## 2016-02-28 DIAGNOSIS — Z89612 Acquired absence of left leg above knee: Secondary | ICD-10-CM | POA: Diagnosis not present

## 2016-02-28 DIAGNOSIS — E1152 Type 2 diabetes mellitus with diabetic peripheral angiopathy with gangrene: Secondary | ICD-10-CM | POA: Diagnosis not present

## 2016-02-28 DIAGNOSIS — Z4781 Encounter for orthopedic aftercare following surgical amputation: Secondary | ICD-10-CM | POA: Diagnosis not present

## 2016-02-28 DIAGNOSIS — I120 Hypertensive chronic kidney disease with stage 5 chronic kidney disease or end stage renal disease: Secondary | ICD-10-CM | POA: Diagnosis not present

## 2016-02-28 DIAGNOSIS — F329 Major depressive disorder, single episode, unspecified: Secondary | ICD-10-CM | POA: Diagnosis not present

## 2016-02-29 ENCOUNTER — Ambulatory Visit: Payer: Medicare Other | Admitting: Vascular Surgery

## 2016-02-29 ENCOUNTER — Encounter (HOSPITAL_COMMUNITY): Payer: Medicare Other

## 2016-02-29 DIAGNOSIS — N186 End stage renal disease: Secondary | ICD-10-CM | POA: Diagnosis not present

## 2016-02-29 DIAGNOSIS — N2581 Secondary hyperparathyroidism of renal origin: Secondary | ICD-10-CM | POA: Diagnosis not present

## 2016-02-29 DIAGNOSIS — E1129 Type 2 diabetes mellitus with other diabetic kidney complication: Secondary | ICD-10-CM | POA: Diagnosis not present

## 2016-02-29 DIAGNOSIS — D631 Anemia in chronic kidney disease: Secondary | ICD-10-CM | POA: Diagnosis not present

## 2016-03-01 DIAGNOSIS — Z4781 Encounter for orthopedic aftercare following surgical amputation: Secondary | ICD-10-CM | POA: Diagnosis not present

## 2016-03-01 DIAGNOSIS — I120 Hypertensive chronic kidney disease with stage 5 chronic kidney disease or end stage renal disease: Secondary | ICD-10-CM | POA: Diagnosis not present

## 2016-03-01 DIAGNOSIS — E1152 Type 2 diabetes mellitus with diabetic peripheral angiopathy with gangrene: Secondary | ICD-10-CM | POA: Diagnosis not present

## 2016-03-01 DIAGNOSIS — E1122 Type 2 diabetes mellitus with diabetic chronic kidney disease: Secondary | ICD-10-CM | POA: Diagnosis not present

## 2016-03-01 DIAGNOSIS — Z89612 Acquired absence of left leg above knee: Secondary | ICD-10-CM | POA: Diagnosis not present

## 2016-03-01 DIAGNOSIS — F329 Major depressive disorder, single episode, unspecified: Secondary | ICD-10-CM | POA: Diagnosis not present

## 2016-03-02 DIAGNOSIS — E1122 Type 2 diabetes mellitus with diabetic chronic kidney disease: Secondary | ICD-10-CM | POA: Diagnosis not present

## 2016-03-02 DIAGNOSIS — N2581 Secondary hyperparathyroidism of renal origin: Secondary | ICD-10-CM | POA: Diagnosis not present

## 2016-03-02 DIAGNOSIS — E1152 Type 2 diabetes mellitus with diabetic peripheral angiopathy with gangrene: Secondary | ICD-10-CM | POA: Diagnosis not present

## 2016-03-02 DIAGNOSIS — N186 End stage renal disease: Secondary | ICD-10-CM | POA: Diagnosis not present

## 2016-03-02 DIAGNOSIS — E1129 Type 2 diabetes mellitus with other diabetic kidney complication: Secondary | ICD-10-CM | POA: Diagnosis not present

## 2016-03-02 DIAGNOSIS — Z4781 Encounter for orthopedic aftercare following surgical amputation: Secondary | ICD-10-CM | POA: Diagnosis not present

## 2016-03-02 DIAGNOSIS — I120 Hypertensive chronic kidney disease with stage 5 chronic kidney disease or end stage renal disease: Secondary | ICD-10-CM | POA: Diagnosis not present

## 2016-03-02 DIAGNOSIS — D631 Anemia in chronic kidney disease: Secondary | ICD-10-CM | POA: Diagnosis not present

## 2016-03-02 DIAGNOSIS — Z89612 Acquired absence of left leg above knee: Secondary | ICD-10-CM | POA: Diagnosis not present

## 2016-03-02 DIAGNOSIS — F329 Major depressive disorder, single episode, unspecified: Secondary | ICD-10-CM | POA: Diagnosis not present

## 2016-03-05 DIAGNOSIS — E1129 Type 2 diabetes mellitus with other diabetic kidney complication: Secondary | ICD-10-CM | POA: Diagnosis not present

## 2016-03-05 DIAGNOSIS — N2581 Secondary hyperparathyroidism of renal origin: Secondary | ICD-10-CM | POA: Diagnosis not present

## 2016-03-05 DIAGNOSIS — N186 End stage renal disease: Secondary | ICD-10-CM | POA: Diagnosis not present

## 2016-03-05 DIAGNOSIS — D631 Anemia in chronic kidney disease: Secondary | ICD-10-CM | POA: Diagnosis not present

## 2016-03-06 DIAGNOSIS — Z89612 Acquired absence of left leg above knee: Secondary | ICD-10-CM | POA: Diagnosis not present

## 2016-03-06 DIAGNOSIS — I120 Hypertensive chronic kidney disease with stage 5 chronic kidney disease or end stage renal disease: Secondary | ICD-10-CM | POA: Diagnosis not present

## 2016-03-06 DIAGNOSIS — E1152 Type 2 diabetes mellitus with diabetic peripheral angiopathy with gangrene: Secondary | ICD-10-CM | POA: Diagnosis not present

## 2016-03-06 DIAGNOSIS — Z4781 Encounter for orthopedic aftercare following surgical amputation: Secondary | ICD-10-CM | POA: Diagnosis not present

## 2016-03-06 DIAGNOSIS — F329 Major depressive disorder, single episode, unspecified: Secondary | ICD-10-CM | POA: Diagnosis not present

## 2016-03-06 DIAGNOSIS — E1122 Type 2 diabetes mellitus with diabetic chronic kidney disease: Secondary | ICD-10-CM | POA: Diagnosis not present

## 2016-03-07 DIAGNOSIS — N186 End stage renal disease: Secondary | ICD-10-CM | POA: Diagnosis not present

## 2016-03-07 DIAGNOSIS — E1129 Type 2 diabetes mellitus with other diabetic kidney complication: Secondary | ICD-10-CM | POA: Diagnosis not present

## 2016-03-07 DIAGNOSIS — D631 Anemia in chronic kidney disease: Secondary | ICD-10-CM | POA: Diagnosis not present

## 2016-03-07 DIAGNOSIS — N2581 Secondary hyperparathyroidism of renal origin: Secondary | ICD-10-CM | POA: Diagnosis not present

## 2016-03-09 DIAGNOSIS — E1129 Type 2 diabetes mellitus with other diabetic kidney complication: Secondary | ICD-10-CM | POA: Diagnosis not present

## 2016-03-09 DIAGNOSIS — Z4781 Encounter for orthopedic aftercare following surgical amputation: Secondary | ICD-10-CM | POA: Diagnosis not present

## 2016-03-09 DIAGNOSIS — E1152 Type 2 diabetes mellitus with diabetic peripheral angiopathy with gangrene: Secondary | ICD-10-CM | POA: Diagnosis not present

## 2016-03-09 DIAGNOSIS — Z89612 Acquired absence of left leg above knee: Secondary | ICD-10-CM | POA: Diagnosis not present

## 2016-03-09 DIAGNOSIS — E1122 Type 2 diabetes mellitus with diabetic chronic kidney disease: Secondary | ICD-10-CM | POA: Diagnosis not present

## 2016-03-09 DIAGNOSIS — F329 Major depressive disorder, single episode, unspecified: Secondary | ICD-10-CM | POA: Diagnosis not present

## 2016-03-09 DIAGNOSIS — D631 Anemia in chronic kidney disease: Secondary | ICD-10-CM | POA: Diagnosis not present

## 2016-03-09 DIAGNOSIS — N186 End stage renal disease: Secondary | ICD-10-CM | POA: Diagnosis not present

## 2016-03-09 DIAGNOSIS — I120 Hypertensive chronic kidney disease with stage 5 chronic kidney disease or end stage renal disease: Secondary | ICD-10-CM | POA: Diagnosis not present

## 2016-03-09 DIAGNOSIS — N2581 Secondary hyperparathyroidism of renal origin: Secondary | ICD-10-CM | POA: Diagnosis not present

## 2016-03-12 DIAGNOSIS — N2581 Secondary hyperparathyroidism of renal origin: Secondary | ICD-10-CM | POA: Diagnosis not present

## 2016-03-12 DIAGNOSIS — D631 Anemia in chronic kidney disease: Secondary | ICD-10-CM | POA: Diagnosis not present

## 2016-03-12 DIAGNOSIS — E1129 Type 2 diabetes mellitus with other diabetic kidney complication: Secondary | ICD-10-CM | POA: Diagnosis not present

## 2016-03-12 DIAGNOSIS — N186 End stage renal disease: Secondary | ICD-10-CM | POA: Diagnosis not present

## 2016-03-13 DIAGNOSIS — N2581 Secondary hyperparathyroidism of renal origin: Secondary | ICD-10-CM | POA: Diagnosis not present

## 2016-03-13 DIAGNOSIS — E1129 Type 2 diabetes mellitus with other diabetic kidney complication: Secondary | ICD-10-CM | POA: Diagnosis not present

## 2016-03-13 DIAGNOSIS — N186 End stage renal disease: Secondary | ICD-10-CM | POA: Diagnosis not present

## 2016-03-13 DIAGNOSIS — D631 Anemia in chronic kidney disease: Secondary | ICD-10-CM | POA: Diagnosis not present

## 2016-03-14 DIAGNOSIS — E1152 Type 2 diabetes mellitus with diabetic peripheral angiopathy with gangrene: Secondary | ICD-10-CM | POA: Diagnosis not present

## 2016-03-14 DIAGNOSIS — E1122 Type 2 diabetes mellitus with diabetic chronic kidney disease: Secondary | ICD-10-CM | POA: Diagnosis not present

## 2016-03-14 DIAGNOSIS — F329 Major depressive disorder, single episode, unspecified: Secondary | ICD-10-CM | POA: Diagnosis not present

## 2016-03-14 DIAGNOSIS — Z4781 Encounter for orthopedic aftercare following surgical amputation: Secondary | ICD-10-CM | POA: Diagnosis not present

## 2016-03-14 DIAGNOSIS — Z89612 Acquired absence of left leg above knee: Secondary | ICD-10-CM | POA: Diagnosis not present

## 2016-03-14 DIAGNOSIS — I120 Hypertensive chronic kidney disease with stage 5 chronic kidney disease or end stage renal disease: Secondary | ICD-10-CM | POA: Diagnosis not present

## 2016-03-16 DIAGNOSIS — N186 End stage renal disease: Secondary | ICD-10-CM | POA: Diagnosis not present

## 2016-03-16 DIAGNOSIS — N2581 Secondary hyperparathyroidism of renal origin: Secondary | ICD-10-CM | POA: Diagnosis not present

## 2016-03-16 DIAGNOSIS — E1129 Type 2 diabetes mellitus with other diabetic kidney complication: Secondary | ICD-10-CM | POA: Diagnosis not present

## 2016-03-16 DIAGNOSIS — D631 Anemia in chronic kidney disease: Secondary | ICD-10-CM | POA: Diagnosis not present

## 2016-03-19 DIAGNOSIS — E1129 Type 2 diabetes mellitus with other diabetic kidney complication: Secondary | ICD-10-CM | POA: Diagnosis not present

## 2016-03-19 DIAGNOSIS — N2581 Secondary hyperparathyroidism of renal origin: Secondary | ICD-10-CM | POA: Diagnosis not present

## 2016-03-19 DIAGNOSIS — N186 End stage renal disease: Secondary | ICD-10-CM | POA: Diagnosis not present

## 2016-03-19 DIAGNOSIS — D631 Anemia in chronic kidney disease: Secondary | ICD-10-CM | POA: Diagnosis not present

## 2016-03-20 DIAGNOSIS — E1122 Type 2 diabetes mellitus with diabetic chronic kidney disease: Secondary | ICD-10-CM | POA: Diagnosis not present

## 2016-03-20 DIAGNOSIS — E1152 Type 2 diabetes mellitus with diabetic peripheral angiopathy with gangrene: Secondary | ICD-10-CM | POA: Diagnosis not present

## 2016-03-20 DIAGNOSIS — I120 Hypertensive chronic kidney disease with stage 5 chronic kidney disease or end stage renal disease: Secondary | ICD-10-CM | POA: Diagnosis not present

## 2016-03-20 DIAGNOSIS — Z89612 Acquired absence of left leg above knee: Secondary | ICD-10-CM | POA: Diagnosis not present

## 2016-03-20 DIAGNOSIS — F329 Major depressive disorder, single episode, unspecified: Secondary | ICD-10-CM | POA: Diagnosis not present

## 2016-03-20 DIAGNOSIS — Z4781 Encounter for orthopedic aftercare following surgical amputation: Secondary | ICD-10-CM | POA: Diagnosis not present

## 2016-03-21 DIAGNOSIS — D631 Anemia in chronic kidney disease: Secondary | ICD-10-CM | POA: Diagnosis not present

## 2016-03-21 DIAGNOSIS — E1129 Type 2 diabetes mellitus with other diabetic kidney complication: Secondary | ICD-10-CM | POA: Diagnosis not present

## 2016-03-21 DIAGNOSIS — N186 End stage renal disease: Secondary | ICD-10-CM | POA: Diagnosis not present

## 2016-03-21 DIAGNOSIS — N2581 Secondary hyperparathyroidism of renal origin: Secondary | ICD-10-CM | POA: Diagnosis not present

## 2016-03-22 DIAGNOSIS — N186 End stage renal disease: Secondary | ICD-10-CM | POA: Diagnosis not present

## 2016-03-22 DIAGNOSIS — Z992 Dependence on renal dialysis: Secondary | ICD-10-CM | POA: Diagnosis not present

## 2016-03-22 DIAGNOSIS — E1129 Type 2 diabetes mellitus with other diabetic kidney complication: Secondary | ICD-10-CM | POA: Diagnosis not present

## 2016-03-23 DIAGNOSIS — N186 End stage renal disease: Secondary | ICD-10-CM | POA: Diagnosis not present

## 2016-03-23 DIAGNOSIS — E1122 Type 2 diabetes mellitus with diabetic chronic kidney disease: Secondary | ICD-10-CM | POA: Diagnosis not present

## 2016-03-23 DIAGNOSIS — F329 Major depressive disorder, single episode, unspecified: Secondary | ICD-10-CM | POA: Diagnosis not present

## 2016-03-23 DIAGNOSIS — I120 Hypertensive chronic kidney disease with stage 5 chronic kidney disease or end stage renal disease: Secondary | ICD-10-CM | POA: Diagnosis not present

## 2016-03-23 DIAGNOSIS — Z4781 Encounter for orthopedic aftercare following surgical amputation: Secondary | ICD-10-CM | POA: Diagnosis not present

## 2016-03-23 DIAGNOSIS — E1152 Type 2 diabetes mellitus with diabetic peripheral angiopathy with gangrene: Secondary | ICD-10-CM | POA: Diagnosis not present

## 2016-03-23 DIAGNOSIS — D631 Anemia in chronic kidney disease: Secondary | ICD-10-CM | POA: Diagnosis not present

## 2016-03-23 DIAGNOSIS — Z89612 Acquired absence of left leg above knee: Secondary | ICD-10-CM | POA: Diagnosis not present

## 2016-03-23 DIAGNOSIS — E1129 Type 2 diabetes mellitus with other diabetic kidney complication: Secondary | ICD-10-CM | POA: Diagnosis not present

## 2016-03-23 DIAGNOSIS — N2581 Secondary hyperparathyroidism of renal origin: Secondary | ICD-10-CM | POA: Diagnosis not present

## 2016-03-26 DIAGNOSIS — D631 Anemia in chronic kidney disease: Secondary | ICD-10-CM | POA: Diagnosis not present

## 2016-03-26 DIAGNOSIS — N186 End stage renal disease: Secondary | ICD-10-CM | POA: Diagnosis not present

## 2016-03-26 DIAGNOSIS — N2581 Secondary hyperparathyroidism of renal origin: Secondary | ICD-10-CM | POA: Diagnosis not present

## 2016-03-26 DIAGNOSIS — E1129 Type 2 diabetes mellitus with other diabetic kidney complication: Secondary | ICD-10-CM | POA: Diagnosis not present

## 2016-03-27 ENCOUNTER — Telehealth: Payer: Self-pay

## 2016-03-27 ENCOUNTER — Encounter: Payer: Self-pay | Admitting: Family Medicine

## 2016-03-27 ENCOUNTER — Ambulatory Visit: Payer: Medicare Other | Attending: Family Medicine | Admitting: Family Medicine

## 2016-03-27 VITALS — BP 129/72 | HR 98 | Temp 98.4°F | Wt 153.0 lb

## 2016-03-27 DIAGNOSIS — E1151 Type 2 diabetes mellitus with diabetic peripheral angiopathy without gangrene: Secondary | ICD-10-CM | POA: Insufficient documentation

## 2016-03-27 DIAGNOSIS — Z881 Allergy status to other antibiotic agents status: Secondary | ICD-10-CM | POA: Diagnosis not present

## 2016-03-27 DIAGNOSIS — I480 Paroxysmal atrial fibrillation: Secondary | ICD-10-CM | POA: Diagnosis not present

## 2016-03-27 DIAGNOSIS — K219 Gastro-esophageal reflux disease without esophagitis: Secondary | ICD-10-CM | POA: Insufficient documentation

## 2016-03-27 DIAGNOSIS — I120 Hypertensive chronic kidney disease with stage 5 chronic kidney disease or end stage renal disease: Secondary | ICD-10-CM | POA: Diagnosis not present

## 2016-03-27 DIAGNOSIS — Z888 Allergy status to other drugs, medicaments and biological substances status: Secondary | ICD-10-CM | POA: Diagnosis not present

## 2016-03-27 DIAGNOSIS — I739 Peripheral vascular disease, unspecified: Secondary | ICD-10-CM | POA: Diagnosis not present

## 2016-03-27 DIAGNOSIS — Z992 Dependence on renal dialysis: Secondary | ICD-10-CM | POA: Diagnosis not present

## 2016-03-27 DIAGNOSIS — Z89512 Acquired absence of left leg below knee: Secondary | ICD-10-CM | POA: Insufficient documentation

## 2016-03-27 DIAGNOSIS — Z8673 Personal history of transient ischemic attack (TIA), and cerebral infarction without residual deficits: Secondary | ICD-10-CM | POA: Insufficient documentation

## 2016-03-27 DIAGNOSIS — Z9119 Patient's noncompliance with other medical treatment and regimen: Secondary | ICD-10-CM | POA: Insufficient documentation

## 2016-03-27 DIAGNOSIS — I779 Disorder of arteries and arterioles, unspecified: Secondary | ICD-10-CM | POA: Diagnosis not present

## 2016-03-27 DIAGNOSIS — F329 Major depressive disorder, single episode, unspecified: Secondary | ICD-10-CM | POA: Diagnosis not present

## 2016-03-27 DIAGNOSIS — N186 End stage renal disease: Secondary | ICD-10-CM | POA: Diagnosis not present

## 2016-03-27 DIAGNOSIS — E1122 Type 2 diabetes mellitus with diabetic chronic kidney disease: Secondary | ICD-10-CM | POA: Diagnosis not present

## 2016-03-27 DIAGNOSIS — Z7902 Long term (current) use of antithrombotics/antiplatelets: Secondary | ICD-10-CM | POA: Insufficient documentation

## 2016-03-27 DIAGNOSIS — Z89612 Acquired absence of left leg above knee: Secondary | ICD-10-CM | POA: Insufficient documentation

## 2016-03-27 DIAGNOSIS — E1152 Type 2 diabetes mellitus with diabetic peripheral angiopathy with gangrene: Secondary | ICD-10-CM | POA: Diagnosis not present

## 2016-03-27 DIAGNOSIS — K5900 Constipation, unspecified: Secondary | ICD-10-CM | POA: Diagnosis not present

## 2016-03-27 DIAGNOSIS — Z4781 Encounter for orthopedic aftercare following surgical amputation: Secondary | ICD-10-CM | POA: Diagnosis not present

## 2016-03-27 DIAGNOSIS — Z7982 Long term (current) use of aspirin: Secondary | ICD-10-CM | POA: Diagnosis not present

## 2016-03-27 DIAGNOSIS — Z89619 Acquired absence of unspecified leg above knee: Secondary | ICD-10-CM | POA: Diagnosis not present

## 2016-03-27 LAB — POCT GLYCOSYLATED HEMOGLOBIN (HGB A1C): HEMOGLOBIN A1C: 8

## 2016-03-27 LAB — GLUCOSE, POCT (MANUAL RESULT ENTRY): POC Glucose: 209 mg/dl — AB (ref 70–99)

## 2016-03-27 MED ORDER — POLYETHYLENE GLYCOL 3350 17 G PO PACK: 17.0000 g | PACK | Freq: Every day | ORAL | 2 refills | Status: AC

## 2016-03-27 MED FILL — POLYETHYLENE GLYCOL 3350: 15 days supply | Qty: 255 | Fill #0

## 2016-03-27 NOTE — Telephone Encounter (Signed)
Completed PCS form forwarded to Dr Jarold Song for signature.

## 2016-03-27 NOTE — Progress Notes (Signed)
Subjective:    Patient ID: [name], female    DOB: 01/22/1961, 55 y.o.   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  Dressing changes are performed twice a [facility] by [facility] and her last visit to her vascular surgeon was last month; she has an upcoming appointment in 2 weeks. At her last office visit she did have a left stump infection and had been on Linezolid which she has since completed. She is currently being worked up for prosthesis   Her A1c has trended up from 7.0-8.0 and she endorses noncompliance with her glipizide; denies hypoglycemia, visual complaints. She is requesting PCS services.  Past Medical History:  Diagnosis Date  . Anemia   . Atrial fibrillation ([place])   . Complication of anesthesia    Morphine makes her nauseated, states she has small veins  . Constipation   . CVA (cerebral infarction)    right parietal 05/19/2000  . DM (diabetes mellitus) type II controlled peripheral vascular disorder ([place])   . ESRD (end stage renal disease) ([place])    dialysis M/W/F  . GERD (gastroesophageal reflux disease)   . Hyperparathyroidism   . Hypertension   . Peripheral vascular disease ([place])   . Pneumonia   . Slip/trip w/o falling due to stepping on object, subs July  2014  . Stroke ([place])    2002,no residual  . Vaginal bleeding   . Wound infection, posttraumatic    left BKA    Past Surgical History:  Procedure Laterality Date  . ABDOMINAL AORTAGRAM N/A 05/27/2012   Procedure: ABDOMINAL Maxcine Ham;  Surgeon: Serafina Mitchell, MD;  Location: [facility] CATH LAB;  Service: Cardiovascular;  Laterality: N/A;  . AMPUTATION Left 06/28/2015   Procedure:  Left  BELOW KNEE amputation;  Surgeon: Angelia Mould, MD;  Location: [place];  Service: Vascular;  Laterality: Left;  . AMPUTATION Left 11/08/2015   Procedure: AMPUTATION ABOVE KNEE / LEFT;  Surgeon: Angelia Mould, MD;  Location: [place];  Service: Vascular;  Laterality: Left;  . APPLICATION OF WOUND VAC Left 05/05/2015   Procedure: APPLICATION OF WOUND VAC;  Surgeon: Angelia Mould, MD;  Location: [place];  Service: Vascular;  Laterality: Left;  . AV FISTULA PLACEMENT Right    upper thigh  . BRACHIAL ARTERY GRAFT     x2 for dialysis  . dental extractions Bilateral   . DILATION AND CURETTAGE OF UTERUS  1986  . FOOT SURGERY Left    5 TOES AMPUTATED  . I&D EXTREMITY Left 05/05/2015   Procedure: DEBRIDEMENT HEEL WOUND;  Surgeon: Angelia Mould, MD;  Location: [place];  Service: Vascular;  Laterality: Left;  . KNEE SURGERY Right X 2  . LEG SURGERY Right   . LOWER EXTREMITY ANGIOGRAM Left 05/27/2012   Procedure: LOWER EXTREMITY ANGIOGRAM;  Surgeon: Serafina Mitchell, MD;  Location: [facility] CATH LAB;  Service: Cardiovascular;  Laterality: Left;  lt leg angio  . parathyroidectomy with autotransplant  12/07/2010  . PERIPHERAL VASCULAR CATHETERIZATION N/A 01/11/2015   Procedure: Lower Extremity Angiography;  Surgeon: Serafina Mitchell, MD;  Location: [place] CV LAB;  Service: Cardiovascular;  Laterality: N/A;  . TOOTH EXTRACTION  Sep 08, 2014  . TRANSMETATARSAL  AMPUTATION Left 07/15/2012   Procedure: TRANSMETATARSAL AMPUTATION;  Surgeon: Angelia Mould, MD;  Location: [place];  Service: Vascular;  Laterality: Left;    Allergies  Allergen Reactions  . Doxycycline Itching  . Peroxyl [Hydrogen Peroxide] Hives and Rash     Review of Systems  Constitutional: Negative for activity change, appetite change and fatigue.  HENT: Negative for congestion, sinus pressure and sore throat.   Eyes: Negative for visual disturbance.  Respiratory: Negative for cough, chest tightness, shortness of  breath and wheezing.   Cardiovascular: Negative for chest pain and palpitations.  Gastrointestinal: Positive for constipation. Negative for abdominal distention and abdominal pain.  Endocrine: Negative for polydipsia.  Genitourinary: Negative for dysuria and frequency.  Musculoskeletal: Negative for arthralgias and back pain.  Skin: Positive for wound. Negative for rash.  Neurological: Negative for tremors, light-headedness and numbness.  Hematological: Does not bruise/bleed easily.  Psychiatric/Behavioral: Negative for agitation and behavioral problems.       Objective: Vitals:   03/27/16 1529  BP: 129/72  Pulse: 98  Temp: 98.4 F (36.9 C)  TempSrc: Oral  SpO2: 99%  Weight: 153 lb (69.4 kg)      Physical Exam Constitutional: She is oriented to person, place, and time. She appears well-developed and well-nourished.  Cardiovascular: Normal rate and normal heart sounds; nonpalpable. Bowledpedis and right foot   No murmur heard. Pulmonary/Chest: Effort normal and breath sounds normal. She has no wheezes. She has no rales. She exhibits no tenderness.  Abdominal: Soft. Bowel sounds are normal. She exhibits no distension and no mass. There is no tenderness.  Musculoskeletal:  Left AKA with stump With tiny wound, no discharge  Neurological: She is alert and oriented to person, place, and time.        Lab Results  Component Value Date   HGBA1C 8.0 03/27/2016    Assessment & Plan:  1. DM (diabetes mellitus) type II controlled peripheral vascular disorder ([place]) Unontrolled with A1c of 8.0 which has trended up from 7.0 previously due to noncompliance with medications Emphasized compliance as she has not been taking glipizide daily ADA diet. - Glucose (CBG) - HgB A1c  2. End stage renal disease on dialysis [facility]) Continue hemodialysis on Monday Wednesday Friday as per protocol We'll complete paperwork for [facility] services  3. Paroxysmal atrial fibrillation (HCC) Currently on  aspirin and Plavix  4. Essential hypertension Controlled  5. AKA stump  ([place]) Left AKA stump infection has almost completely resolved Dressing changes performed in the clinic Continue dressing changes at home Completed linezolid Keep appointment with vascular surgery in 2 more weeks Currently being worked up for prosthesis  6. Constipation Refilled MiraLAX

## 2016-03-28 ENCOUNTER — Encounter: Payer: Self-pay | Admitting: Vascular Surgery

## 2016-03-28 DIAGNOSIS — D631 Anemia in chronic kidney disease: Secondary | ICD-10-CM | POA: Diagnosis not present

## 2016-03-28 DIAGNOSIS — N186 End stage renal disease: Secondary | ICD-10-CM | POA: Diagnosis not present

## 2016-03-28 DIAGNOSIS — N2581 Secondary hyperparathyroidism of renal origin: Secondary | ICD-10-CM | POA: Diagnosis not present

## 2016-03-28 DIAGNOSIS — E1129 Type 2 diabetes mellitus with other diabetic kidney complication: Secondary | ICD-10-CM | POA: Diagnosis not present

## 2016-03-29 ENCOUNTER — Ambulatory Visit: Payer: Self-pay | Admitting: Pharmacist Clinician (PhC)/ Clinical Pharmacy Specialist

## 2016-03-29 DIAGNOSIS — I4891 Unspecified atrial fibrillation: Secondary | ICD-10-CM

## 2016-03-29 DIAGNOSIS — Z8673 Personal history of transient ischemic attack (TIA), and cerebral infarction without residual deficits: Secondary | ICD-10-CM

## 2016-03-30 DIAGNOSIS — I120 Hypertensive chronic kidney disease with stage 5 chronic kidney disease or end stage renal disease: Secondary | ICD-10-CM | POA: Diagnosis not present

## 2016-03-30 DIAGNOSIS — E1122 Type 2 diabetes mellitus with diabetic chronic kidney disease: Secondary | ICD-10-CM | POA: Diagnosis not present

## 2016-03-30 DIAGNOSIS — E1129 Type 2 diabetes mellitus with other diabetic kidney complication: Secondary | ICD-10-CM | POA: Diagnosis not present

## 2016-03-30 DIAGNOSIS — D631 Anemia in chronic kidney disease: Secondary | ICD-10-CM | POA: Diagnosis not present

## 2016-03-30 DIAGNOSIS — E1152 Type 2 diabetes mellitus with diabetic peripheral angiopathy with gangrene: Secondary | ICD-10-CM | POA: Diagnosis not present

## 2016-03-30 DIAGNOSIS — Z89612 Acquired absence of left leg above knee: Secondary | ICD-10-CM | POA: Diagnosis not present

## 2016-03-30 DIAGNOSIS — Z4781 Encounter for orthopedic aftercare following surgical amputation: Secondary | ICD-10-CM | POA: Diagnosis not present

## 2016-03-30 DIAGNOSIS — F329 Major depressive disorder, single episode, unspecified: Secondary | ICD-10-CM | POA: Diagnosis not present

## 2016-03-30 DIAGNOSIS — N2581 Secondary hyperparathyroidism of renal origin: Secondary | ICD-10-CM | POA: Diagnosis not present

## 2016-03-30 DIAGNOSIS — N186 End stage renal disease: Secondary | ICD-10-CM | POA: Diagnosis not present

## 2016-04-02 DIAGNOSIS — N186 End stage renal disease: Secondary | ICD-10-CM | POA: Diagnosis not present

## 2016-04-02 DIAGNOSIS — N2581 Secondary hyperparathyroidism of renal origin: Secondary | ICD-10-CM | POA: Diagnosis not present

## 2016-04-02 DIAGNOSIS — Z89612 Acquired absence of left leg above knee: Secondary | ICD-10-CM | POA: Diagnosis not present

## 2016-04-02 DIAGNOSIS — Z992 Dependence on renal dialysis: Secondary | ICD-10-CM | POA: Diagnosis not present

## 2016-04-02 DIAGNOSIS — Z7984 Long term (current) use of oral hypoglycemic drugs: Secondary | ICD-10-CM | POA: Diagnosis not present

## 2016-04-02 DIAGNOSIS — E1122 Type 2 diabetes mellitus with diabetic chronic kidney disease: Secondary | ICD-10-CM | POA: Diagnosis not present

## 2016-04-02 DIAGNOSIS — Z8673 Personal history of transient ischemic attack (TIA), and cerebral infarction without residual deficits: Secondary | ICD-10-CM | POA: Diagnosis not present

## 2016-04-02 DIAGNOSIS — D631 Anemia in chronic kidney disease: Secondary | ICD-10-CM | POA: Diagnosis not present

## 2016-04-02 DIAGNOSIS — E1129 Type 2 diabetes mellitus with other diabetic kidney complication: Secondary | ICD-10-CM | POA: Diagnosis not present

## 2016-04-02 DIAGNOSIS — Z993 Dependence on wheelchair: Secondary | ICD-10-CM | POA: Diagnosis not present

## 2016-04-02 DIAGNOSIS — E1152 Type 2 diabetes mellitus with diabetic peripheral angiopathy with gangrene: Secondary | ICD-10-CM | POA: Diagnosis not present

## 2016-04-02 DIAGNOSIS — I120 Hypertensive chronic kidney disease with stage 5 chronic kidney disease or end stage renal disease: Secondary | ICD-10-CM | POA: Diagnosis not present

## 2016-04-02 DIAGNOSIS — Z4801 Encounter for change or removal of surgical wound dressing: Secondary | ICD-10-CM | POA: Diagnosis not present

## 2016-04-02 DIAGNOSIS — E1140 Type 2 diabetes mellitus with diabetic neuropathy, unspecified: Secondary | ICD-10-CM | POA: Diagnosis not present

## 2016-04-02 DIAGNOSIS — I480 Paroxysmal atrial fibrillation: Secondary | ICD-10-CM | POA: Diagnosis not present

## 2016-04-02 DIAGNOSIS — F329 Major depressive disorder, single episode, unspecified: Secondary | ICD-10-CM | POA: Diagnosis not present

## 2016-04-02 DIAGNOSIS — Z4781 Encounter for orthopedic aftercare following surgical amputation: Secondary | ICD-10-CM | POA: Diagnosis not present

## 2016-04-03 DIAGNOSIS — I120 Hypertensive chronic kidney disease with stage 5 chronic kidney disease or end stage renal disease: Secondary | ICD-10-CM | POA: Diagnosis not present

## 2016-04-03 DIAGNOSIS — F329 Major depressive disorder, single episode, unspecified: Secondary | ICD-10-CM | POA: Diagnosis not present

## 2016-04-03 DIAGNOSIS — Z4781 Encounter for orthopedic aftercare following surgical amputation: Secondary | ICD-10-CM | POA: Diagnosis not present

## 2016-04-03 DIAGNOSIS — E1122 Type 2 diabetes mellitus with diabetic chronic kidney disease: Secondary | ICD-10-CM | POA: Diagnosis not present

## 2016-04-03 DIAGNOSIS — Z89612 Acquired absence of left leg above knee: Secondary | ICD-10-CM | POA: Diagnosis not present

## 2016-04-03 DIAGNOSIS — E1152 Type 2 diabetes mellitus with diabetic peripheral angiopathy with gangrene: Secondary | ICD-10-CM | POA: Diagnosis not present

## 2016-04-04 DIAGNOSIS — D631 Anemia in chronic kidney disease: Secondary | ICD-10-CM | POA: Diagnosis not present

## 2016-04-04 DIAGNOSIS — N186 End stage renal disease: Secondary | ICD-10-CM | POA: Diagnosis not present

## 2016-04-04 DIAGNOSIS — E1129 Type 2 diabetes mellitus with other diabetic kidney complication: Secondary | ICD-10-CM | POA: Diagnosis not present

## 2016-04-04 DIAGNOSIS — N2581 Secondary hyperparathyroidism of renal origin: Secondary | ICD-10-CM | POA: Diagnosis not present

## 2016-04-05 ENCOUNTER — Ambulatory Visit (INDEPENDENT_AMBULATORY_CARE_PROVIDER_SITE_OTHER): Payer: Medicare Other | Admitting: Vascular Surgery

## 2016-04-05 ENCOUNTER — Encounter: Payer: Self-pay | Admitting: Vascular Surgery

## 2016-04-05 VITALS — BP 137/78 | HR 96 | Temp 99.2°F | Resp 18 | Ht 62.0 in | Wt 153.0 lb

## 2016-04-05 DIAGNOSIS — Z48812 Encounter for surgical aftercare following surgery on the circulatory system: Secondary | ICD-10-CM

## 2016-04-05 NOTE — Progress Notes (Signed)
Patient name: [name] MRN: [id number] DOB: 04/17/1961 Sex: female  REASON FOR VISIT: Follow up of left above-the-knee amputation.  HPI: [name] is a 55 y.o. female who has a left above-the-knee amputation. This was done after her left below-the-knee amputation failed to heal.  I last saw her on 02/23/16. At that time, the left above-the-knee amputation site had almost completely healed. It had a wound measuring 1 cm at the apex of the stump that was about 2 cm deep. Set her up for 6 week follow up visit. I also plan on following her right leg closely given her peripheral vascular disease.  Since I saw her last, she is continued to do the dressing changes and now the wound is completely healed. She denies fever or chills.  Current Outpatient Prescriptions  Medication Sig Dispense Refill  . acetaminophen (TYLENOL) 325 MG tablet Take 650 mg by mouth every Monday, Wednesday, and Friday. With dialysis    . aspirin EC 81 MG tablet Take 81 mg by mouth daily.    . B Complex-C-Folic Acid (RENA-VITE RX) 1 MG TABS Take 1 mg by mouth daily.  1  . CALCIUM-VITAMIN D PO Take 1 tablet by mouth 3 (three) times daily with meals.     . clopidogrel (PLAVIX) 75 MG tablet Take 75 mg by mouth daily.    . Cyanocobalamin (VITAMIN B-12 PO) Take 1 tablet by mouth daily. Every Sun, Tue, Thu, Sat for supplement on non-Dialysis days    . diazepam (VALIUM) 5 MG tablet Take 1 tablet (5 mg total) by mouth every 8 (eight) hours as needed for muscle spasms. 30 tablet 0  . diltiazem (CARDIZEM) 120 MG tablet take 1 tablet by mouth ON MONDAYS, WEDNESDAYS, AND FRIDAYS  0  . feeding supplement, GLUCERNA SHAKE, (GLUCERNA SHAKE) LIQD Take 237 mLs by mouth 3 (three) times daily with meals.  0  . glipiZIDE (GLUCOTROL) 5 MG tablet Take 5 mg by mouth daily before breakfast.    . Nutritional Supplements (FEEDING SUPPLEMENT, NEPRO CARB STEADY,) LIQD Take 237 mLs by mouth daily at 3 pm.  0  . polyethylene glycol (MIRALAX /  GLYCOLAX) packet Take 17 g by mouth daily. 30 each 2  . Probiotic CAPS Take 1 capsule by mouth daily.    . vitamin C (ASCORBIC ACID) 500 MG tablet Take 500 mg by mouth daily.    . vitamin E 400 UNIT capsule Take 400 Units by mouth daily.     No current facility-administered medications for this visit.     REVIEW OF SYSTEMS:  [X]  denotes positive finding, [ ]  denotes negative finding Cardiac  Comments:  Chest pain or chest pressure:    Shortness of breath upon exertion:    Short of breath when lying flat:    Irregular heart rhythm:    Constitutional    Fever or chills:      PHYSICAL EXAM: Vitals:   04/05/16 1601  BP: 137/78  Pulse: 96  Resp: 18  Temp: 99.2 F (37.3 C)  TempSrc: Oral  SpO2: 98%  Weight: 153 lb (69.4 kg)  Height: 5\' 2"  (1.575 m)    GENERAL: The patient is a well-nourished female, in no acute distress. The vital signs are documented above. CARDIOVASCULAR: There is a regular rate and rhythm. PULMONARY: There is good air exchange bilaterally without wheezing or rales. Her left above-the-knee amputation site has completely healed. There are no open wounds. The right foot is warm and well perfused with no ischemic ulcers.  MEDICAL ISSUES:  STATUS POST LEFT ABOVE-THE-KNEE AMPUTATION: Her left above-the-knee amputation site has healed and I have spoken with the prosthetist who will begin working on a prosthesis for her. She seems highly motivated. In addition, I will continue to keep a close eye on the right foot. I have ordered a follow up visit in 6 months and ABIs and I will see her back at that time. She knows to call sooner if she has problems.  Deitra Mayo [facility] [place] [phone]

## 2016-04-06 DIAGNOSIS — N2581 Secondary hyperparathyroidism of renal origin: Secondary | ICD-10-CM | POA: Diagnosis not present

## 2016-04-06 DIAGNOSIS — D631 Anemia in chronic kidney disease: Secondary | ICD-10-CM | POA: Diagnosis not present

## 2016-04-06 DIAGNOSIS — E1129 Type 2 diabetes mellitus with other diabetic kidney complication: Secondary | ICD-10-CM | POA: Diagnosis not present

## 2016-04-06 DIAGNOSIS — N186 End stage renal disease: Secondary | ICD-10-CM | POA: Diagnosis not present

## 2016-04-09 DIAGNOSIS — N2581 Secondary hyperparathyroidism of renal origin: Secondary | ICD-10-CM | POA: Diagnosis not present

## 2016-04-09 DIAGNOSIS — E1129 Type 2 diabetes mellitus with other diabetic kidney complication: Secondary | ICD-10-CM | POA: Diagnosis not present

## 2016-04-09 DIAGNOSIS — N186 End stage renal disease: Secondary | ICD-10-CM | POA: Diagnosis not present

## 2016-04-09 DIAGNOSIS — D631 Anemia in chronic kidney disease: Secondary | ICD-10-CM | POA: Diagnosis not present

## 2016-04-10 DIAGNOSIS — F329 Major depressive disorder, single episode, unspecified: Secondary | ICD-10-CM | POA: Diagnosis not present

## 2016-04-10 DIAGNOSIS — Z89612 Acquired absence of left leg above knee: Secondary | ICD-10-CM | POA: Diagnosis not present

## 2016-04-10 DIAGNOSIS — E1152 Type 2 diabetes mellitus with diabetic peripheral angiopathy with gangrene: Secondary | ICD-10-CM | POA: Diagnosis not present

## 2016-04-10 DIAGNOSIS — Z4781 Encounter for orthopedic aftercare following surgical amputation: Secondary | ICD-10-CM | POA: Diagnosis not present

## 2016-04-10 DIAGNOSIS — E1122 Type 2 diabetes mellitus with diabetic chronic kidney disease: Secondary | ICD-10-CM | POA: Diagnosis not present

## 2016-04-10 DIAGNOSIS — I120 Hypertensive chronic kidney disease with stage 5 chronic kidney disease or end stage renal disease: Secondary | ICD-10-CM | POA: Diagnosis not present

## 2016-04-10 NOTE — Addendum Note (Signed)
Addended by: Lianne Cure A on: 04/10/2016 11:10 AM   Modules accepted: Orders

## 2016-04-11 DIAGNOSIS — N2581 Secondary hyperparathyroidism of renal origin: Secondary | ICD-10-CM | POA: Diagnosis not present

## 2016-04-11 DIAGNOSIS — D631 Anemia in chronic kidney disease: Secondary | ICD-10-CM | POA: Diagnosis not present

## 2016-04-11 DIAGNOSIS — E1129 Type 2 diabetes mellitus with other diabetic kidney complication: Secondary | ICD-10-CM | POA: Diagnosis not present

## 2016-04-11 DIAGNOSIS — N186 End stage renal disease: Secondary | ICD-10-CM | POA: Diagnosis not present

## 2016-04-13 DIAGNOSIS — I120 Hypertensive chronic kidney disease with stage 5 chronic kidney disease or end stage renal disease: Secondary | ICD-10-CM | POA: Diagnosis not present

## 2016-04-13 DIAGNOSIS — Z4781 Encounter for orthopedic aftercare following surgical amputation: Secondary | ICD-10-CM | POA: Diagnosis not present

## 2016-04-13 DIAGNOSIS — N186 End stage renal disease: Secondary | ICD-10-CM | POA: Diagnosis not present

## 2016-04-13 DIAGNOSIS — E1129 Type 2 diabetes mellitus with other diabetic kidney complication: Secondary | ICD-10-CM | POA: Diagnosis not present

## 2016-04-13 DIAGNOSIS — E1122 Type 2 diabetes mellitus with diabetic chronic kidney disease: Secondary | ICD-10-CM | POA: Diagnosis not present

## 2016-04-13 DIAGNOSIS — D631 Anemia in chronic kidney disease: Secondary | ICD-10-CM | POA: Diagnosis not present

## 2016-04-13 DIAGNOSIS — E1152 Type 2 diabetes mellitus with diabetic peripheral angiopathy with gangrene: Secondary | ICD-10-CM | POA: Diagnosis not present

## 2016-04-13 DIAGNOSIS — F329 Major depressive disorder, single episode, unspecified: Secondary | ICD-10-CM | POA: Diagnosis not present

## 2016-04-13 DIAGNOSIS — Z89612 Acquired absence of left leg above knee: Secondary | ICD-10-CM | POA: Diagnosis not present

## 2016-04-13 DIAGNOSIS — N2581 Secondary hyperparathyroidism of renal origin: Secondary | ICD-10-CM | POA: Diagnosis not present

## 2016-04-15 DIAGNOSIS — N186 End stage renal disease: Secondary | ICD-10-CM | POA: Diagnosis not present

## 2016-04-15 DIAGNOSIS — N2581 Secondary hyperparathyroidism of renal origin: Secondary | ICD-10-CM | POA: Diagnosis not present

## 2016-04-15 DIAGNOSIS — D631 Anemia in chronic kidney disease: Secondary | ICD-10-CM | POA: Diagnosis not present

## 2016-04-15 DIAGNOSIS — E1129 Type 2 diabetes mellitus with other diabetic kidney complication: Secondary | ICD-10-CM | POA: Diagnosis not present

## 2016-04-18 DIAGNOSIS — E1129 Type 2 diabetes mellitus with other diabetic kidney complication: Secondary | ICD-10-CM | POA: Diagnosis not present

## 2016-04-18 DIAGNOSIS — D631 Anemia in chronic kidney disease: Secondary | ICD-10-CM | POA: Diagnosis not present

## 2016-04-18 DIAGNOSIS — N186 End stage renal disease: Secondary | ICD-10-CM | POA: Diagnosis not present

## 2016-04-18 DIAGNOSIS — N2581 Secondary hyperparathyroidism of renal origin: Secondary | ICD-10-CM | POA: Diagnosis not present

## 2016-04-20 DIAGNOSIS — I120 Hypertensive chronic kidney disease with stage 5 chronic kidney disease or end stage renal disease: Secondary | ICD-10-CM | POA: Diagnosis not present

## 2016-04-20 DIAGNOSIS — N2581 Secondary hyperparathyroidism of renal origin: Secondary | ICD-10-CM | POA: Diagnosis not present

## 2016-04-20 DIAGNOSIS — F329 Major depressive disorder, single episode, unspecified: Secondary | ICD-10-CM | POA: Diagnosis not present

## 2016-04-20 DIAGNOSIS — E1152 Type 2 diabetes mellitus with diabetic peripheral angiopathy with gangrene: Secondary | ICD-10-CM | POA: Diagnosis not present

## 2016-04-20 DIAGNOSIS — Z4781 Encounter for orthopedic aftercare following surgical amputation: Secondary | ICD-10-CM | POA: Diagnosis not present

## 2016-04-20 DIAGNOSIS — E1129 Type 2 diabetes mellitus with other diabetic kidney complication: Secondary | ICD-10-CM | POA: Diagnosis not present

## 2016-04-20 DIAGNOSIS — N186 End stage renal disease: Secondary | ICD-10-CM | POA: Diagnosis not present

## 2016-04-20 DIAGNOSIS — E1122 Type 2 diabetes mellitus with diabetic chronic kidney disease: Secondary | ICD-10-CM | POA: Diagnosis not present

## 2016-04-20 DIAGNOSIS — Z89612 Acquired absence of left leg above knee: Secondary | ICD-10-CM | POA: Diagnosis not present

## 2016-04-20 DIAGNOSIS — D631 Anemia in chronic kidney disease: Secondary | ICD-10-CM | POA: Diagnosis not present

## 2016-04-22 DIAGNOSIS — Z992 Dependence on renal dialysis: Secondary | ICD-10-CM | POA: Diagnosis not present

## 2016-04-22 DIAGNOSIS — E1129 Type 2 diabetes mellitus with other diabetic kidney complication: Secondary | ICD-10-CM | POA: Diagnosis not present

## 2016-04-22 DIAGNOSIS — N186 End stage renal disease: Secondary | ICD-10-CM | POA: Diagnosis not present

## 2016-04-22 DIAGNOSIS — N2581 Secondary hyperparathyroidism of renal origin: Secondary | ICD-10-CM | POA: Diagnosis not present

## 2016-04-22 DIAGNOSIS — D631 Anemia in chronic kidney disease: Secondary | ICD-10-CM | POA: Diagnosis not present

## 2016-04-25 DIAGNOSIS — E1129 Type 2 diabetes mellitus with other diabetic kidney complication: Secondary | ICD-10-CM | POA: Diagnosis not present

## 2016-04-25 DIAGNOSIS — D631 Anemia in chronic kidney disease: Secondary | ICD-10-CM | POA: Diagnosis not present

## 2016-04-25 DIAGNOSIS — N2581 Secondary hyperparathyroidism of renal origin: Secondary | ICD-10-CM | POA: Diagnosis not present

## 2016-04-25 DIAGNOSIS — N186 End stage renal disease: Secondary | ICD-10-CM | POA: Diagnosis not present

## 2016-04-27 DIAGNOSIS — N2581 Secondary hyperparathyroidism of renal origin: Secondary | ICD-10-CM | POA: Diagnosis not present

## 2016-04-27 DIAGNOSIS — D631 Anemia in chronic kidney disease: Secondary | ICD-10-CM | POA: Diagnosis not present

## 2016-04-27 DIAGNOSIS — N186 End stage renal disease: Secondary | ICD-10-CM | POA: Diagnosis not present

## 2016-04-27 DIAGNOSIS — E1129 Type 2 diabetes mellitus with other diabetic kidney complication: Secondary | ICD-10-CM | POA: Diagnosis not present

## 2016-04-30 DIAGNOSIS — N186 End stage renal disease: Secondary | ICD-10-CM | POA: Diagnosis not present

## 2016-04-30 DIAGNOSIS — D631 Anemia in chronic kidney disease: Secondary | ICD-10-CM | POA: Diagnosis not present

## 2016-04-30 DIAGNOSIS — E1129 Type 2 diabetes mellitus with other diabetic kidney complication: Secondary | ICD-10-CM | POA: Diagnosis not present

## 2016-04-30 DIAGNOSIS — N2581 Secondary hyperparathyroidism of renal origin: Secondary | ICD-10-CM | POA: Diagnosis not present

## 2016-05-02 ENCOUNTER — Other Ambulatory Visit: Payer: Self-pay | Admitting: Chiropractic Medicine

## 2016-05-02 DIAGNOSIS — D631 Anemia in chronic kidney disease: Secondary | ICD-10-CM | POA: Diagnosis not present

## 2016-05-02 DIAGNOSIS — N186 End stage renal disease: Secondary | ICD-10-CM | POA: Diagnosis not present

## 2016-05-02 DIAGNOSIS — Z1231 Encounter for screening mammogram for malignant neoplasm of breast: Secondary | ICD-10-CM

## 2016-05-02 DIAGNOSIS — N2581 Secondary hyperparathyroidism of renal origin: Secondary | ICD-10-CM | POA: Diagnosis not present

## 2016-05-02 DIAGNOSIS — E1129 Type 2 diabetes mellitus with other diabetic kidney complication: Secondary | ICD-10-CM | POA: Diagnosis not present

## 2016-05-04 DIAGNOSIS — E1129 Type 2 diabetes mellitus with other diabetic kidney complication: Secondary | ICD-10-CM | POA: Diagnosis not present

## 2016-05-04 DIAGNOSIS — N186 End stage renal disease: Secondary | ICD-10-CM | POA: Diagnosis not present

## 2016-05-04 DIAGNOSIS — N2581 Secondary hyperparathyroidism of renal origin: Secondary | ICD-10-CM | POA: Diagnosis not present

## 2016-05-04 DIAGNOSIS — D631 Anemia in chronic kidney disease: Secondary | ICD-10-CM | POA: Diagnosis not present

## 2016-05-07 DIAGNOSIS — E1129 Type 2 diabetes mellitus with other diabetic kidney complication: Secondary | ICD-10-CM | POA: Diagnosis not present

## 2016-05-07 DIAGNOSIS — N186 End stage renal disease: Secondary | ICD-10-CM | POA: Diagnosis not present

## 2016-05-07 DIAGNOSIS — D631 Anemia in chronic kidney disease: Secondary | ICD-10-CM | POA: Diagnosis not present

## 2016-05-07 DIAGNOSIS — N2581 Secondary hyperparathyroidism of renal origin: Secondary | ICD-10-CM | POA: Diagnosis not present

## 2016-05-09 DIAGNOSIS — E1129 Type 2 diabetes mellitus with other diabetic kidney complication: Secondary | ICD-10-CM | POA: Diagnosis not present

## 2016-05-09 DIAGNOSIS — N2581 Secondary hyperparathyroidism of renal origin: Secondary | ICD-10-CM | POA: Diagnosis not present

## 2016-05-09 DIAGNOSIS — N186 End stage renal disease: Secondary | ICD-10-CM | POA: Diagnosis not present

## 2016-05-09 DIAGNOSIS — D631 Anemia in chronic kidney disease: Secondary | ICD-10-CM | POA: Diagnosis not present

## 2016-05-11 DIAGNOSIS — D631 Anemia in chronic kidney disease: Secondary | ICD-10-CM | POA: Diagnosis not present

## 2016-05-11 DIAGNOSIS — N186 End stage renal disease: Secondary | ICD-10-CM | POA: Diagnosis not present

## 2016-05-11 DIAGNOSIS — N2581 Secondary hyperparathyroidism of renal origin: Secondary | ICD-10-CM | POA: Diagnosis not present

## 2016-05-11 DIAGNOSIS — E1129 Type 2 diabetes mellitus with other diabetic kidney complication: Secondary | ICD-10-CM | POA: Diagnosis not present

## 2016-05-14 DIAGNOSIS — D631 Anemia in chronic kidney disease: Secondary | ICD-10-CM | POA: Diagnosis not present

## 2016-05-14 DIAGNOSIS — N2581 Secondary hyperparathyroidism of renal origin: Secondary | ICD-10-CM | POA: Diagnosis not present

## 2016-05-14 DIAGNOSIS — E1129 Type 2 diabetes mellitus with other diabetic kidney complication: Secondary | ICD-10-CM | POA: Diagnosis not present

## 2016-05-14 DIAGNOSIS — N186 End stage renal disease: Secondary | ICD-10-CM | POA: Diagnosis not present

## 2016-05-16 DIAGNOSIS — N2581 Secondary hyperparathyroidism of renal origin: Secondary | ICD-10-CM | POA: Diagnosis not present

## 2016-05-16 DIAGNOSIS — E1129 Type 2 diabetes mellitus with other diabetic kidney complication: Secondary | ICD-10-CM | POA: Diagnosis not present

## 2016-05-16 DIAGNOSIS — D631 Anemia in chronic kidney disease: Secondary | ICD-10-CM | POA: Diagnosis not present

## 2016-05-16 DIAGNOSIS — N186 End stage renal disease: Secondary | ICD-10-CM | POA: Diagnosis not present

## 2016-05-17 ENCOUNTER — Ambulatory Visit
Admission: RE | Admit: 2016-05-17 | Discharge: 2016-05-17 | Disposition: A | Discharge status: Home / Self Care | Payer: Medicare Other | Source: Ambulatory Visit | Attending: Chiropractic Medicine | Admitting: Chiropractic Medicine

## 2016-05-17 DIAGNOSIS — Z1231 Encounter for screening mammogram for malignant neoplasm of breast: Secondary | ICD-10-CM | POA: Diagnosis not present

## 2016-05-18 DIAGNOSIS — D631 Anemia in chronic kidney disease: Secondary | ICD-10-CM | POA: Diagnosis not present

## 2016-05-18 DIAGNOSIS — N186 End stage renal disease: Secondary | ICD-10-CM | POA: Diagnosis not present

## 2016-05-18 DIAGNOSIS — N2581 Secondary hyperparathyroidism of renal origin: Secondary | ICD-10-CM | POA: Diagnosis not present

## 2016-05-18 DIAGNOSIS — E1129 Type 2 diabetes mellitus with other diabetic kidney complication: Secondary | ICD-10-CM | POA: Diagnosis not present

## 2016-05-21 DIAGNOSIS — E1129 Type 2 diabetes mellitus with other diabetic kidney complication: Secondary | ICD-10-CM | POA: Diagnosis not present

## 2016-05-21 DIAGNOSIS — N186 End stage renal disease: Secondary | ICD-10-CM | POA: Diagnosis not present

## 2016-05-21 DIAGNOSIS — D631 Anemia in chronic kidney disease: Secondary | ICD-10-CM | POA: Diagnosis not present

## 2016-05-21 DIAGNOSIS — N2581 Secondary hyperparathyroidism of renal origin: Secondary | ICD-10-CM | POA: Diagnosis not present

## 2016-05-23 DIAGNOSIS — N2581 Secondary hyperparathyroidism of renal origin: Secondary | ICD-10-CM | POA: Diagnosis not present

## 2016-05-23 DIAGNOSIS — E1129 Type 2 diabetes mellitus with other diabetic kidney complication: Secondary | ICD-10-CM | POA: Diagnosis not present

## 2016-05-23 DIAGNOSIS — D631 Anemia in chronic kidney disease: Secondary | ICD-10-CM | POA: Diagnosis not present

## 2016-05-23 DIAGNOSIS — Z992 Dependence on renal dialysis: Secondary | ICD-10-CM | POA: Diagnosis not present

## 2016-05-23 DIAGNOSIS — N186 End stage renal disease: Secondary | ICD-10-CM | POA: Diagnosis not present

## 2016-05-25 DIAGNOSIS — N2581 Secondary hyperparathyroidism of renal origin: Secondary | ICD-10-CM | POA: Diagnosis not present

## 2016-05-25 DIAGNOSIS — N186 End stage renal disease: Secondary | ICD-10-CM | POA: Diagnosis not present

## 2016-05-25 DIAGNOSIS — D631 Anemia in chronic kidney disease: Secondary | ICD-10-CM | POA: Diagnosis not present

## 2016-05-25 DIAGNOSIS — E1129 Type 2 diabetes mellitus with other diabetic kidney complication: Secondary | ICD-10-CM | POA: Diagnosis not present

## 2016-05-28 DIAGNOSIS — N2581 Secondary hyperparathyroidism of renal origin: Secondary | ICD-10-CM | POA: Diagnosis not present

## 2016-05-28 DIAGNOSIS — N186 End stage renal disease: Secondary | ICD-10-CM | POA: Diagnosis not present

## 2016-05-28 DIAGNOSIS — D631 Anemia in chronic kidney disease: Secondary | ICD-10-CM | POA: Diagnosis not present

## 2016-05-28 DIAGNOSIS — E1129 Type 2 diabetes mellitus with other diabetic kidney complication: Secondary | ICD-10-CM | POA: Diagnosis not present

## 2016-05-30 DIAGNOSIS — N2581 Secondary hyperparathyroidism of renal origin: Secondary | ICD-10-CM | POA: Diagnosis not present

## 2016-05-30 DIAGNOSIS — D631 Anemia in chronic kidney disease: Secondary | ICD-10-CM | POA: Diagnosis not present

## 2016-05-30 DIAGNOSIS — E1129 Type 2 diabetes mellitus with other diabetic kidney complication: Secondary | ICD-10-CM | POA: Diagnosis not present

## 2016-05-30 DIAGNOSIS — N186 End stage renal disease: Secondary | ICD-10-CM | POA: Diagnosis not present

## 2016-06-01 DIAGNOSIS — N2581 Secondary hyperparathyroidism of renal origin: Secondary | ICD-10-CM | POA: Diagnosis not present

## 2016-06-01 DIAGNOSIS — E1129 Type 2 diabetes mellitus with other diabetic kidney complication: Secondary | ICD-10-CM | POA: Diagnosis not present

## 2016-06-01 DIAGNOSIS — N186 End stage renal disease: Secondary | ICD-10-CM | POA: Diagnosis not present

## 2016-06-01 DIAGNOSIS — D631 Anemia in chronic kidney disease: Secondary | ICD-10-CM | POA: Diagnosis not present

## 2016-06-04 DIAGNOSIS — E1129 Type 2 diabetes mellitus with other diabetic kidney complication: Secondary | ICD-10-CM | POA: Diagnosis not present

## 2016-06-04 DIAGNOSIS — N2581 Secondary hyperparathyroidism of renal origin: Secondary | ICD-10-CM | POA: Diagnosis not present

## 2016-06-04 DIAGNOSIS — D631 Anemia in chronic kidney disease: Secondary | ICD-10-CM | POA: Diagnosis not present

## 2016-06-04 DIAGNOSIS — N186 End stage renal disease: Secondary | ICD-10-CM | POA: Diagnosis not present

## 2016-06-06 DIAGNOSIS — E1129 Type 2 diabetes mellitus with other diabetic kidney complication: Secondary | ICD-10-CM | POA: Diagnosis not present

## 2016-06-06 DIAGNOSIS — D631 Anemia in chronic kidney disease: Secondary | ICD-10-CM | POA: Diagnosis not present

## 2016-06-06 DIAGNOSIS — N2581 Secondary hyperparathyroidism of renal origin: Secondary | ICD-10-CM | POA: Diagnosis not present

## 2016-06-06 DIAGNOSIS — N186 End stage renal disease: Secondary | ICD-10-CM | POA: Diagnosis not present

## 2016-06-08 DIAGNOSIS — N186 End stage renal disease: Secondary | ICD-10-CM | POA: Diagnosis not present

## 2016-06-08 DIAGNOSIS — D631 Anemia in chronic kidney disease: Secondary | ICD-10-CM | POA: Diagnosis not present

## 2016-06-08 DIAGNOSIS — N2581 Secondary hyperparathyroidism of renal origin: Secondary | ICD-10-CM | POA: Diagnosis not present

## 2016-06-08 DIAGNOSIS — E1129 Type 2 diabetes mellitus with other diabetic kidney complication: Secondary | ICD-10-CM | POA: Diagnosis not present

## 2016-06-11 DIAGNOSIS — N2581 Secondary hyperparathyroidism of renal origin: Secondary | ICD-10-CM | POA: Diagnosis not present

## 2016-06-11 DIAGNOSIS — D631 Anemia in chronic kidney disease: Secondary | ICD-10-CM | POA: Diagnosis not present

## 2016-06-11 DIAGNOSIS — N186 End stage renal disease: Secondary | ICD-10-CM | POA: Diagnosis not present

## 2016-06-11 DIAGNOSIS — E1129 Type 2 diabetes mellitus with other diabetic kidney complication: Secondary | ICD-10-CM | POA: Diagnosis not present

## 2016-06-13 DIAGNOSIS — D631 Anemia in chronic kidney disease: Secondary | ICD-10-CM | POA: Diagnosis not present

## 2016-06-13 DIAGNOSIS — N186 End stage renal disease: Secondary | ICD-10-CM | POA: Diagnosis not present

## 2016-06-13 DIAGNOSIS — N2581 Secondary hyperparathyroidism of renal origin: Secondary | ICD-10-CM | POA: Diagnosis not present

## 2016-06-13 DIAGNOSIS — E1129 Type 2 diabetes mellitus with other diabetic kidney complication: Secondary | ICD-10-CM | POA: Diagnosis not present

## 2016-06-15 DIAGNOSIS — N2581 Secondary hyperparathyroidism of renal origin: Secondary | ICD-10-CM | POA: Diagnosis not present

## 2016-06-15 DIAGNOSIS — D631 Anemia in chronic kidney disease: Secondary | ICD-10-CM | POA: Diagnosis not present

## 2016-06-15 DIAGNOSIS — N186 End stage renal disease: Secondary | ICD-10-CM | POA: Diagnosis not present

## 2016-06-15 DIAGNOSIS — E1129 Type 2 diabetes mellitus with other diabetic kidney complication: Secondary | ICD-10-CM | POA: Diagnosis not present

## 2016-06-18 DIAGNOSIS — E1129 Type 2 diabetes mellitus with other diabetic kidney complication: Secondary | ICD-10-CM | POA: Diagnosis not present

## 2016-06-18 DIAGNOSIS — D631 Anemia in chronic kidney disease: Secondary | ICD-10-CM | POA: Diagnosis not present

## 2016-06-18 DIAGNOSIS — N2581 Secondary hyperparathyroidism of renal origin: Secondary | ICD-10-CM | POA: Diagnosis not present

## 2016-06-18 DIAGNOSIS — N186 End stage renal disease: Secondary | ICD-10-CM | POA: Diagnosis not present

## 2016-06-20 DIAGNOSIS — Z992 Dependence on renal dialysis: Secondary | ICD-10-CM | POA: Diagnosis not present

## 2016-06-20 DIAGNOSIS — D631 Anemia in chronic kidney disease: Secondary | ICD-10-CM | POA: Diagnosis not present

## 2016-06-20 DIAGNOSIS — N2581 Secondary hyperparathyroidism of renal origin: Secondary | ICD-10-CM | POA: Diagnosis not present

## 2016-06-20 DIAGNOSIS — E1129 Type 2 diabetes mellitus with other diabetic kidney complication: Secondary | ICD-10-CM | POA: Diagnosis not present

## 2016-06-20 DIAGNOSIS — N186 End stage renal disease: Secondary | ICD-10-CM | POA: Diagnosis not present

## 2016-06-22 DIAGNOSIS — N186 End stage renal disease: Secondary | ICD-10-CM | POA: Diagnosis not present

## 2016-06-22 DIAGNOSIS — N2581 Secondary hyperparathyroidism of renal origin: Secondary | ICD-10-CM | POA: Diagnosis not present

## 2016-06-22 DIAGNOSIS — E1129 Type 2 diabetes mellitus with other diabetic kidney complication: Secondary | ICD-10-CM | POA: Diagnosis not present

## 2016-06-25 DIAGNOSIS — N186 End stage renal disease: Secondary | ICD-10-CM | POA: Diagnosis not present

## 2016-06-25 DIAGNOSIS — N2581 Secondary hyperparathyroidism of renal origin: Secondary | ICD-10-CM | POA: Diagnosis not present

## 2016-06-25 DIAGNOSIS — E1129 Type 2 diabetes mellitus with other diabetic kidney complication: Secondary | ICD-10-CM | POA: Diagnosis not present

## 2016-06-27 DIAGNOSIS — N186 End stage renal disease: Secondary | ICD-10-CM | POA: Diagnosis not present

## 2016-06-27 DIAGNOSIS — E1129 Type 2 diabetes mellitus with other diabetic kidney complication: Secondary | ICD-10-CM | POA: Diagnosis not present

## 2016-06-27 DIAGNOSIS — N2581 Secondary hyperparathyroidism of renal origin: Secondary | ICD-10-CM | POA: Diagnosis not present

## 2016-06-29 DIAGNOSIS — N186 End stage renal disease: Secondary | ICD-10-CM | POA: Diagnosis not present

## 2016-06-29 DIAGNOSIS — E1129 Type 2 diabetes mellitus with other diabetic kidney complication: Secondary | ICD-10-CM | POA: Diagnosis not present

## 2016-06-29 DIAGNOSIS — N2581 Secondary hyperparathyroidism of renal origin: Secondary | ICD-10-CM | POA: Diagnosis not present

## 2016-07-02 DIAGNOSIS — E1129 Type 2 diabetes mellitus with other diabetic kidney complication: Secondary | ICD-10-CM | POA: Diagnosis not present

## 2016-07-02 DIAGNOSIS — N2581 Secondary hyperparathyroidism of renal origin: Secondary | ICD-10-CM | POA: Diagnosis not present

## 2016-07-02 DIAGNOSIS — N186 End stage renal disease: Secondary | ICD-10-CM | POA: Diagnosis not present

## 2016-07-04 DIAGNOSIS — N186 End stage renal disease: Secondary | ICD-10-CM | POA: Diagnosis not present

## 2016-07-04 DIAGNOSIS — E1129 Type 2 diabetes mellitus with other diabetic kidney complication: Secondary | ICD-10-CM | POA: Diagnosis not present

## 2016-07-04 DIAGNOSIS — N2581 Secondary hyperparathyroidism of renal origin: Secondary | ICD-10-CM | POA: Diagnosis not present

## 2016-07-06 DIAGNOSIS — N2581 Secondary hyperparathyroidism of renal origin: Secondary | ICD-10-CM | POA: Diagnosis not present

## 2016-07-06 DIAGNOSIS — E1129 Type 2 diabetes mellitus with other diabetic kidney complication: Secondary | ICD-10-CM | POA: Diagnosis not present

## 2016-07-06 DIAGNOSIS — N186 End stage renal disease: Secondary | ICD-10-CM | POA: Diagnosis not present

## 2016-07-09 DIAGNOSIS — N186 End stage renal disease: Secondary | ICD-10-CM | POA: Diagnosis not present

## 2016-07-09 DIAGNOSIS — E1129 Type 2 diabetes mellitus with other diabetic kidney complication: Secondary | ICD-10-CM | POA: Diagnosis not present

## 2016-07-09 DIAGNOSIS — N2581 Secondary hyperparathyroidism of renal origin: Secondary | ICD-10-CM | POA: Diagnosis not present

## 2016-07-11 DIAGNOSIS — E1129 Type 2 diabetes mellitus with other diabetic kidney complication: Secondary | ICD-10-CM | POA: Diagnosis not present

## 2016-07-11 DIAGNOSIS — N186 End stage renal disease: Secondary | ICD-10-CM | POA: Diagnosis not present

## 2016-07-11 DIAGNOSIS — N2581 Secondary hyperparathyroidism of renal origin: Secondary | ICD-10-CM | POA: Diagnosis not present

## 2016-07-13 DIAGNOSIS — N186 End stage renal disease: Secondary | ICD-10-CM | POA: Diagnosis not present

## 2016-07-13 DIAGNOSIS — N2581 Secondary hyperparathyroidism of renal origin: Secondary | ICD-10-CM | POA: Diagnosis not present

## 2016-07-13 DIAGNOSIS — E1129 Type 2 diabetes mellitus with other diabetic kidney complication: Secondary | ICD-10-CM | POA: Diagnosis not present

## 2016-07-16 DIAGNOSIS — E1129 Type 2 diabetes mellitus with other diabetic kidney complication: Secondary | ICD-10-CM | POA: Diagnosis not present

## 2016-07-16 DIAGNOSIS — N186 End stage renal disease: Secondary | ICD-10-CM | POA: Diagnosis not present

## 2016-07-16 DIAGNOSIS — N2581 Secondary hyperparathyroidism of renal origin: Secondary | ICD-10-CM | POA: Diagnosis not present

## 2016-07-18 DIAGNOSIS — E1129 Type 2 diabetes mellitus with other diabetic kidney complication: Secondary | ICD-10-CM | POA: Diagnosis not present

## 2016-07-18 DIAGNOSIS — N186 End stage renal disease: Secondary | ICD-10-CM | POA: Diagnosis not present

## 2016-07-18 DIAGNOSIS — N2581 Secondary hyperparathyroidism of renal origin: Secondary | ICD-10-CM | POA: Diagnosis not present

## 2016-07-20 DIAGNOSIS — N186 End stage renal disease: Secondary | ICD-10-CM | POA: Diagnosis not present

## 2016-07-20 DIAGNOSIS — E1129 Type 2 diabetes mellitus with other diabetic kidney complication: Secondary | ICD-10-CM | POA: Diagnosis not present

## 2016-07-20 DIAGNOSIS — N2581 Secondary hyperparathyroidism of renal origin: Secondary | ICD-10-CM | POA: Diagnosis not present

## 2016-07-21 DIAGNOSIS — E1129 Type 2 diabetes mellitus with other diabetic kidney complication: Secondary | ICD-10-CM | POA: Diagnosis not present

## 2016-07-21 DIAGNOSIS — N186 End stage renal disease: Secondary | ICD-10-CM | POA: Diagnosis not present

## 2016-07-21 DIAGNOSIS — Z992 Dependence on renal dialysis: Secondary | ICD-10-CM | POA: Diagnosis not present

## 2016-07-23 DIAGNOSIS — D631 Anemia in chronic kidney disease: Secondary | ICD-10-CM | POA: Diagnosis not present

## 2016-07-23 DIAGNOSIS — N186 End stage renal disease: Secondary | ICD-10-CM | POA: Diagnosis not present

## 2016-07-23 DIAGNOSIS — E1129 Type 2 diabetes mellitus with other diabetic kidney complication: Secondary | ICD-10-CM | POA: Diagnosis not present

## 2016-07-23 DIAGNOSIS — N2581 Secondary hyperparathyroidism of renal origin: Secondary | ICD-10-CM | POA: Diagnosis not present

## 2016-07-25 DIAGNOSIS — D631 Anemia in chronic kidney disease: Secondary | ICD-10-CM | POA: Diagnosis not present

## 2016-07-25 DIAGNOSIS — N2581 Secondary hyperparathyroidism of renal origin: Secondary | ICD-10-CM | POA: Diagnosis not present

## 2016-07-25 DIAGNOSIS — N186 End stage renal disease: Secondary | ICD-10-CM | POA: Diagnosis not present

## 2016-07-25 DIAGNOSIS — E1129 Type 2 diabetes mellitus with other diabetic kidney complication: Secondary | ICD-10-CM | POA: Diagnosis not present

## 2016-07-27 DIAGNOSIS — N186 End stage renal disease: Secondary | ICD-10-CM | POA: Diagnosis not present

## 2016-07-27 DIAGNOSIS — D631 Anemia in chronic kidney disease: Secondary | ICD-10-CM | POA: Diagnosis not present

## 2016-07-27 DIAGNOSIS — E1129 Type 2 diabetes mellitus with other diabetic kidney complication: Secondary | ICD-10-CM | POA: Diagnosis not present

## 2016-07-27 DIAGNOSIS — N2581 Secondary hyperparathyroidism of renal origin: Secondary | ICD-10-CM | POA: Diagnosis not present

## 2016-07-30 ENCOUNTER — Telehealth: Payer: Self-pay

## 2016-07-30 DIAGNOSIS — N186 End stage renal disease: Secondary | ICD-10-CM | POA: Diagnosis not present

## 2016-07-30 DIAGNOSIS — E1129 Type 2 diabetes mellitus with other diabetic kidney complication: Secondary | ICD-10-CM | POA: Diagnosis not present

## 2016-07-30 DIAGNOSIS — D631 Anemia in chronic kidney disease: Secondary | ICD-10-CM | POA: Diagnosis not present

## 2016-07-30 DIAGNOSIS — N2581 Secondary hyperparathyroidism of renal origin: Secondary | ICD-10-CM | POA: Diagnosis not present

## 2016-07-30 NOTE — Telephone Encounter (Signed)
Called to find out if pt would like to have colonoscopy scheduled 

## 2016-08-01 DIAGNOSIS — N2581 Secondary hyperparathyroidism of renal origin: Secondary | ICD-10-CM | POA: Diagnosis not present

## 2016-08-01 DIAGNOSIS — E1129 Type 2 diabetes mellitus with other diabetic kidney complication: Secondary | ICD-10-CM | POA: Diagnosis not present

## 2016-08-01 DIAGNOSIS — D631 Anemia in chronic kidney disease: Secondary | ICD-10-CM | POA: Diagnosis not present

## 2016-08-01 DIAGNOSIS — N186 End stage renal disease: Secondary | ICD-10-CM | POA: Diagnosis not present

## 2016-08-03 DIAGNOSIS — E1129 Type 2 diabetes mellitus with other diabetic kidney complication: Secondary | ICD-10-CM | POA: Diagnosis not present

## 2016-08-03 DIAGNOSIS — N186 End stage renal disease: Secondary | ICD-10-CM | POA: Diagnosis not present

## 2016-08-03 DIAGNOSIS — N2581 Secondary hyperparathyroidism of renal origin: Secondary | ICD-10-CM | POA: Diagnosis not present

## 2016-08-03 DIAGNOSIS — D631 Anemia in chronic kidney disease: Secondary | ICD-10-CM | POA: Diagnosis not present

## 2016-08-06 DIAGNOSIS — D631 Anemia in chronic kidney disease: Secondary | ICD-10-CM | POA: Diagnosis not present

## 2016-08-06 DIAGNOSIS — E1129 Type 2 diabetes mellitus with other diabetic kidney complication: Secondary | ICD-10-CM | POA: Diagnosis not present

## 2016-08-06 DIAGNOSIS — N2581 Secondary hyperparathyroidism of renal origin: Secondary | ICD-10-CM | POA: Diagnosis not present

## 2016-08-06 DIAGNOSIS — N186 End stage renal disease: Secondary | ICD-10-CM | POA: Diagnosis not present

## 2016-08-08 DIAGNOSIS — N186 End stage renal disease: Secondary | ICD-10-CM | POA: Diagnosis not present

## 2016-08-08 DIAGNOSIS — D631 Anemia in chronic kidney disease: Secondary | ICD-10-CM | POA: Diagnosis not present

## 2016-08-08 DIAGNOSIS — N2581 Secondary hyperparathyroidism of renal origin: Secondary | ICD-10-CM | POA: Diagnosis not present

## 2016-08-08 DIAGNOSIS — E1129 Type 2 diabetes mellitus with other diabetic kidney complication: Secondary | ICD-10-CM | POA: Diagnosis not present

## 2016-08-10 DIAGNOSIS — N186 End stage renal disease: Secondary | ICD-10-CM | POA: Diagnosis not present

## 2016-08-10 DIAGNOSIS — E1129 Type 2 diabetes mellitus with other diabetic kidney complication: Secondary | ICD-10-CM | POA: Diagnosis not present

## 2016-08-10 DIAGNOSIS — N2581 Secondary hyperparathyroidism of renal origin: Secondary | ICD-10-CM | POA: Diagnosis not present

## 2016-08-10 DIAGNOSIS — D631 Anemia in chronic kidney disease: Secondary | ICD-10-CM | POA: Diagnosis not present

## 2016-08-13 DIAGNOSIS — N186 End stage renal disease: Secondary | ICD-10-CM | POA: Diagnosis not present

## 2016-08-13 DIAGNOSIS — D631 Anemia in chronic kidney disease: Secondary | ICD-10-CM | POA: Diagnosis not present

## 2016-08-13 DIAGNOSIS — E1129 Type 2 diabetes mellitus with other diabetic kidney complication: Secondary | ICD-10-CM | POA: Diagnosis not present

## 2016-08-13 DIAGNOSIS — N2581 Secondary hyperparathyroidism of renal origin: Secondary | ICD-10-CM | POA: Diagnosis not present

## 2016-08-15 DIAGNOSIS — N2581 Secondary hyperparathyroidism of renal origin: Secondary | ICD-10-CM | POA: Diagnosis not present

## 2016-08-15 DIAGNOSIS — N186 End stage renal disease: Secondary | ICD-10-CM | POA: Diagnosis not present

## 2016-08-15 DIAGNOSIS — D631 Anemia in chronic kidney disease: Secondary | ICD-10-CM | POA: Diagnosis not present

## 2016-08-15 DIAGNOSIS — E1129 Type 2 diabetes mellitus with other diabetic kidney complication: Secondary | ICD-10-CM | POA: Diagnosis not present

## 2016-08-17 DIAGNOSIS — D631 Anemia in chronic kidney disease: Secondary | ICD-10-CM | POA: Diagnosis not present

## 2016-08-17 DIAGNOSIS — N2581 Secondary hyperparathyroidism of renal origin: Secondary | ICD-10-CM | POA: Diagnosis not present

## 2016-08-17 DIAGNOSIS — N186 End stage renal disease: Secondary | ICD-10-CM | POA: Diagnosis not present

## 2016-08-17 DIAGNOSIS — E1129 Type 2 diabetes mellitus with other diabetic kidney complication: Secondary | ICD-10-CM | POA: Diagnosis not present

## 2016-08-20 DIAGNOSIS — N186 End stage renal disease: Secondary | ICD-10-CM | POA: Diagnosis not present

## 2016-08-20 DIAGNOSIS — E1129 Type 2 diabetes mellitus with other diabetic kidney complication: Secondary | ICD-10-CM | POA: Diagnosis not present

## 2016-08-20 DIAGNOSIS — Z992 Dependence on renal dialysis: Secondary | ICD-10-CM | POA: Diagnosis not present

## 2016-08-20 DIAGNOSIS — D631 Anemia in chronic kidney disease: Secondary | ICD-10-CM | POA: Diagnosis not present

## 2016-08-20 DIAGNOSIS — N2581 Secondary hyperparathyroidism of renal origin: Secondary | ICD-10-CM | POA: Diagnosis not present

## 2016-08-21 DIAGNOSIS — Z992 Dependence on renal dialysis: Secondary | ICD-10-CM | POA: Diagnosis not present

## 2016-08-21 DIAGNOSIS — N186 End stage renal disease: Secondary | ICD-10-CM | POA: Diagnosis not present

## 2016-08-21 DIAGNOSIS — E1129 Type 2 diabetes mellitus with other diabetic kidney complication: Secondary | ICD-10-CM | POA: Diagnosis not present

## 2016-08-22 DIAGNOSIS — E1129 Type 2 diabetes mellitus with other diabetic kidney complication: Secondary | ICD-10-CM | POA: Diagnosis not present

## 2016-08-22 DIAGNOSIS — N186 End stage renal disease: Secondary | ICD-10-CM | POA: Diagnosis not present

## 2016-08-22 DIAGNOSIS — D631 Anemia in chronic kidney disease: Secondary | ICD-10-CM | POA: Diagnosis not present

## 2016-08-22 DIAGNOSIS — N2581 Secondary hyperparathyroidism of renal origin: Secondary | ICD-10-CM | POA: Diagnosis not present

## 2016-08-24 DIAGNOSIS — N2581 Secondary hyperparathyroidism of renal origin: Secondary | ICD-10-CM | POA: Diagnosis not present

## 2016-08-24 DIAGNOSIS — D631 Anemia in chronic kidney disease: Secondary | ICD-10-CM | POA: Diagnosis not present

## 2016-08-24 DIAGNOSIS — N186 End stage renal disease: Secondary | ICD-10-CM | POA: Diagnosis not present

## 2016-08-24 DIAGNOSIS — E1129 Type 2 diabetes mellitus with other diabetic kidney complication: Secondary | ICD-10-CM | POA: Diagnosis not present

## 2016-08-27 DIAGNOSIS — D631 Anemia in chronic kidney disease: Secondary | ICD-10-CM | POA: Diagnosis not present

## 2016-08-27 DIAGNOSIS — N186 End stage renal disease: Secondary | ICD-10-CM | POA: Diagnosis not present

## 2016-08-27 DIAGNOSIS — E1129 Type 2 diabetes mellitus with other diabetic kidney complication: Secondary | ICD-10-CM | POA: Diagnosis not present

## 2016-08-27 DIAGNOSIS — N2581 Secondary hyperparathyroidism of renal origin: Secondary | ICD-10-CM | POA: Diagnosis not present

## 2016-08-28 ENCOUNTER — Encounter: Payer: Self-pay | Admitting: Nurse Practitioner

## 2016-08-29 DIAGNOSIS — N2581 Secondary hyperparathyroidism of renal origin: Secondary | ICD-10-CM | POA: Diagnosis not present

## 2016-08-29 DIAGNOSIS — D631 Anemia in chronic kidney disease: Secondary | ICD-10-CM | POA: Diagnosis not present

## 2016-08-29 DIAGNOSIS — N186 End stage renal disease: Secondary | ICD-10-CM | POA: Diagnosis not present

## 2016-08-29 DIAGNOSIS — E1129 Type 2 diabetes mellitus with other diabetic kidney complication: Secondary | ICD-10-CM | POA: Diagnosis not present

## 2016-08-31 DIAGNOSIS — N2581 Secondary hyperparathyroidism of renal origin: Secondary | ICD-10-CM | POA: Diagnosis not present

## 2016-08-31 DIAGNOSIS — N186 End stage renal disease: Secondary | ICD-10-CM | POA: Diagnosis not present

## 2016-08-31 DIAGNOSIS — E1129 Type 2 diabetes mellitus with other diabetic kidney complication: Secondary | ICD-10-CM | POA: Diagnosis not present

## 2016-08-31 DIAGNOSIS — D631 Anemia in chronic kidney disease: Secondary | ICD-10-CM | POA: Diagnosis not present

## 2016-09-03 DIAGNOSIS — D631 Anemia in chronic kidney disease: Secondary | ICD-10-CM | POA: Diagnosis not present

## 2016-09-03 DIAGNOSIS — N2581 Secondary hyperparathyroidism of renal origin: Secondary | ICD-10-CM | POA: Diagnosis not present

## 2016-09-03 DIAGNOSIS — E1129 Type 2 diabetes mellitus with other diabetic kidney complication: Secondary | ICD-10-CM | POA: Diagnosis not present

## 2016-09-03 DIAGNOSIS — N186 End stage renal disease: Secondary | ICD-10-CM | POA: Diagnosis not present

## 2016-09-05 DIAGNOSIS — D631 Anemia in chronic kidney disease: Secondary | ICD-10-CM | POA: Diagnosis not present

## 2016-09-05 DIAGNOSIS — E1129 Type 2 diabetes mellitus with other diabetic kidney complication: Secondary | ICD-10-CM | POA: Diagnosis not present

## 2016-09-05 DIAGNOSIS — N186 End stage renal disease: Secondary | ICD-10-CM | POA: Diagnosis not present

## 2016-09-05 DIAGNOSIS — N2581 Secondary hyperparathyroidism of renal origin: Secondary | ICD-10-CM | POA: Diagnosis not present

## 2016-09-06 ENCOUNTER — Ambulatory Visit: Payer: Medicare Other | Admitting: Nurse Practitioner

## 2016-09-07 DIAGNOSIS — N186 End stage renal disease: Secondary | ICD-10-CM | POA: Diagnosis not present

## 2016-09-07 DIAGNOSIS — D631 Anemia in chronic kidney disease: Secondary | ICD-10-CM | POA: Diagnosis not present

## 2016-09-07 DIAGNOSIS — N2581 Secondary hyperparathyroidism of renal origin: Secondary | ICD-10-CM | POA: Diagnosis not present

## 2016-09-07 DIAGNOSIS — E1129 Type 2 diabetes mellitus with other diabetic kidney complication: Secondary | ICD-10-CM | POA: Diagnosis not present

## 2016-09-10 DIAGNOSIS — N186 End stage renal disease: Secondary | ICD-10-CM | POA: Diagnosis not present

## 2016-09-10 DIAGNOSIS — E1129 Type 2 diabetes mellitus with other diabetic kidney complication: Secondary | ICD-10-CM | POA: Diagnosis not present

## 2016-09-10 DIAGNOSIS — D631 Anemia in chronic kidney disease: Secondary | ICD-10-CM | POA: Diagnosis not present

## 2016-09-10 DIAGNOSIS — N2581 Secondary hyperparathyroidism of renal origin: Secondary | ICD-10-CM | POA: Diagnosis not present

## 2016-09-12 DIAGNOSIS — E1129 Type 2 diabetes mellitus with other diabetic kidney complication: Secondary | ICD-10-CM | POA: Diagnosis not present

## 2016-09-12 DIAGNOSIS — D631 Anemia in chronic kidney disease: Secondary | ICD-10-CM | POA: Diagnosis not present

## 2016-09-12 DIAGNOSIS — N2581 Secondary hyperparathyroidism of renal origin: Secondary | ICD-10-CM | POA: Diagnosis not present

## 2016-09-12 DIAGNOSIS — N186 End stage renal disease: Secondary | ICD-10-CM | POA: Diagnosis not present

## 2016-09-14 DIAGNOSIS — D631 Anemia in chronic kidney disease: Secondary | ICD-10-CM | POA: Diagnosis not present

## 2016-09-14 DIAGNOSIS — N2581 Secondary hyperparathyroidism of renal origin: Secondary | ICD-10-CM | POA: Diagnosis not present

## 2016-09-14 DIAGNOSIS — N186 End stage renal disease: Secondary | ICD-10-CM | POA: Diagnosis not present

## 2016-09-14 DIAGNOSIS — E1129 Type 2 diabetes mellitus with other diabetic kidney complication: Secondary | ICD-10-CM | POA: Diagnosis not present

## 2016-09-17 DIAGNOSIS — E1129 Type 2 diabetes mellitus with other diabetic kidney complication: Secondary | ICD-10-CM | POA: Diagnosis not present

## 2016-09-17 DIAGNOSIS — N186 End stage renal disease: Secondary | ICD-10-CM | POA: Diagnosis not present

## 2016-09-17 DIAGNOSIS — N2581 Secondary hyperparathyroidism of renal origin: Secondary | ICD-10-CM | POA: Diagnosis not present

## 2016-09-17 DIAGNOSIS — D631 Anemia in chronic kidney disease: Secondary | ICD-10-CM | POA: Diagnosis not present

## 2016-09-20 ENCOUNTER — Encounter: Payer: Self-pay | Admitting: Nephrology

## 2016-09-21 DIAGNOSIS — 419620001 Death: Secondary | SNOMED CT | POA: Diagnosis not present

## 2016-09-21 DEATH — deceased

## 2016-10-05 ENCOUNTER — Encounter: Payer: Self-pay | Admitting: Vascular Surgery

## 2016-10-17 ENCOUNTER — Ambulatory Visit: Payer: Medicare Other | Admitting: Vascular Surgery

## 2016-10-17 ENCOUNTER — Encounter (HOSPITAL_COMMUNITY): Payer: Medicare Other

## 2017-04-20 IMAGING — CR DG CHEST 2V
2 series · 2 of 2 positions shown · non-contrast
Comparison: 08/16/2014

CLINICAL DATA: Mid chest pain today, hypertension, diabetes
mellitus, end-stage renal disease, history stroke

EXAM:
CHEST  2 VIEW

[chest lat]
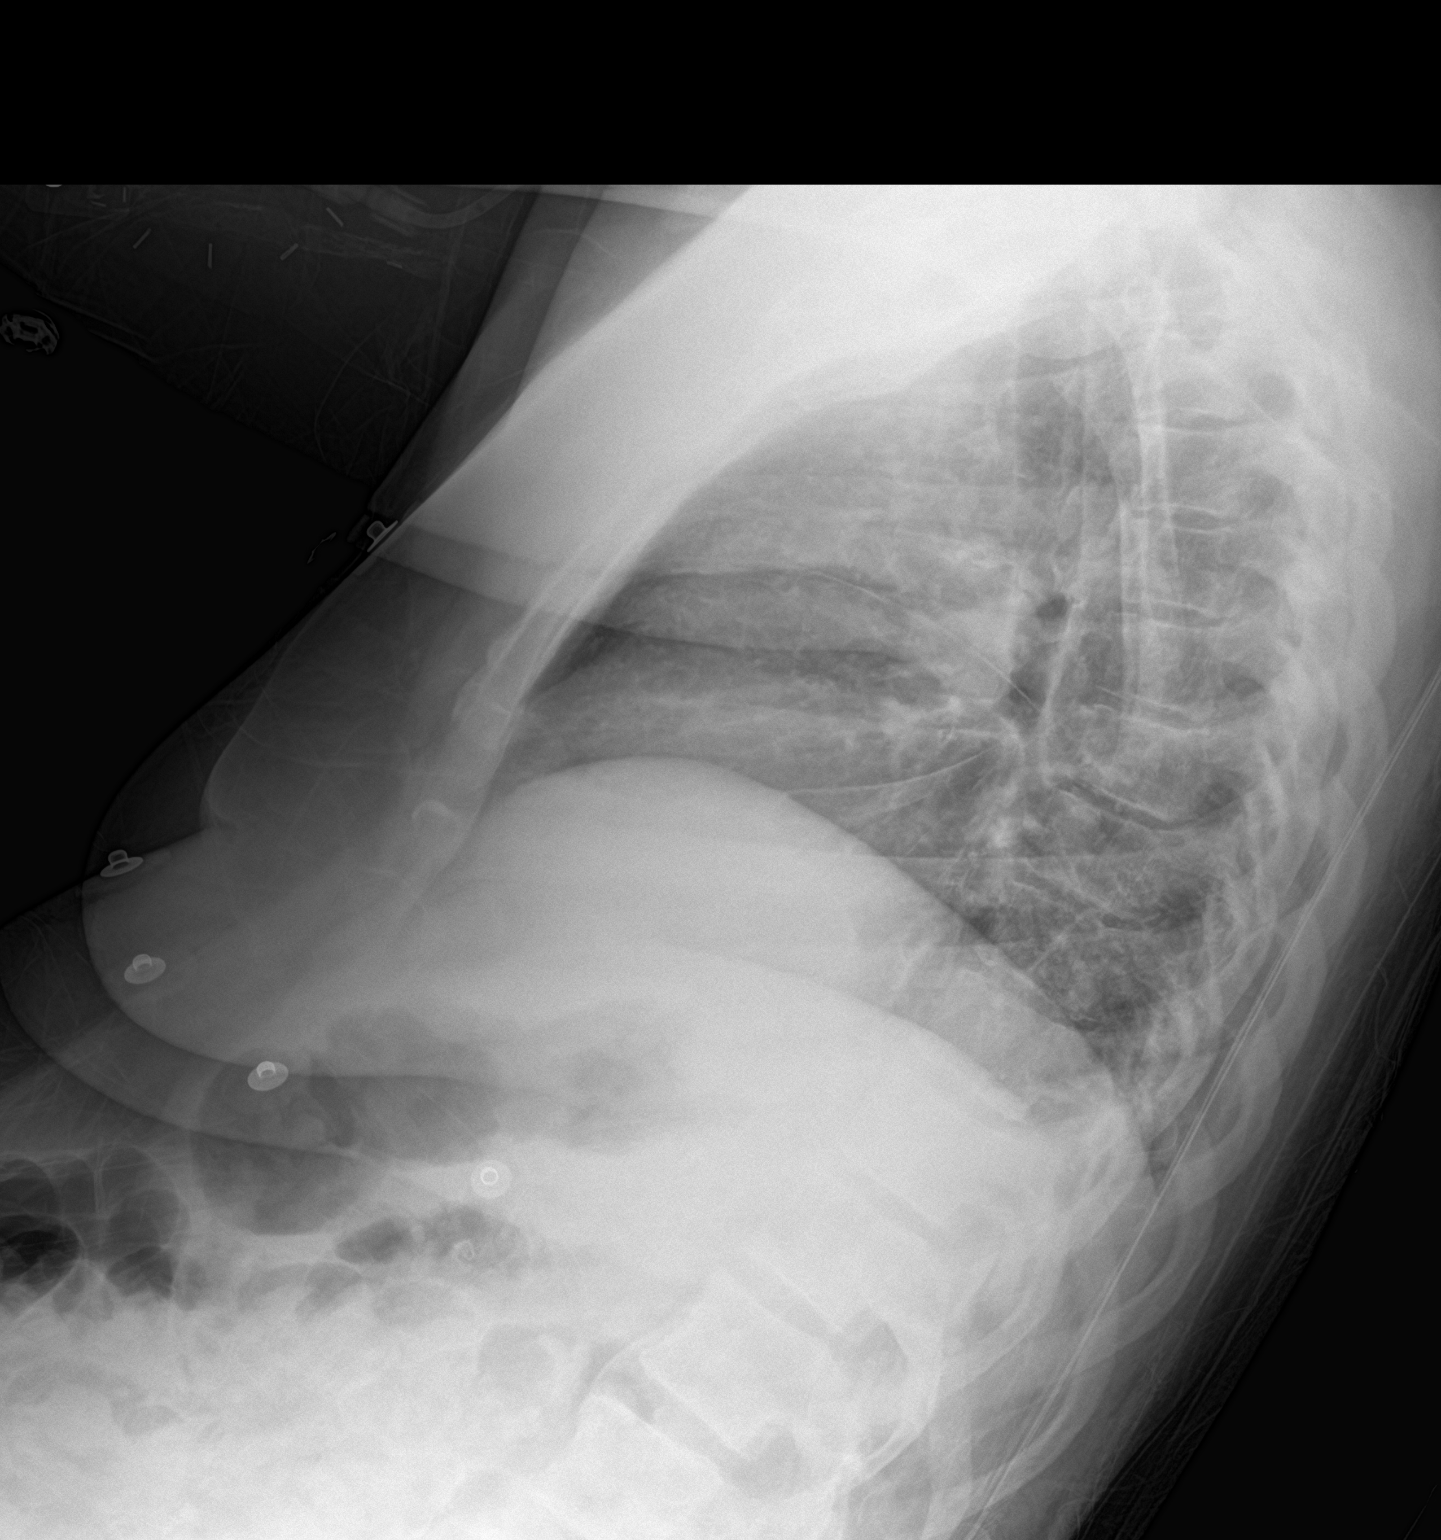

[chest ap]
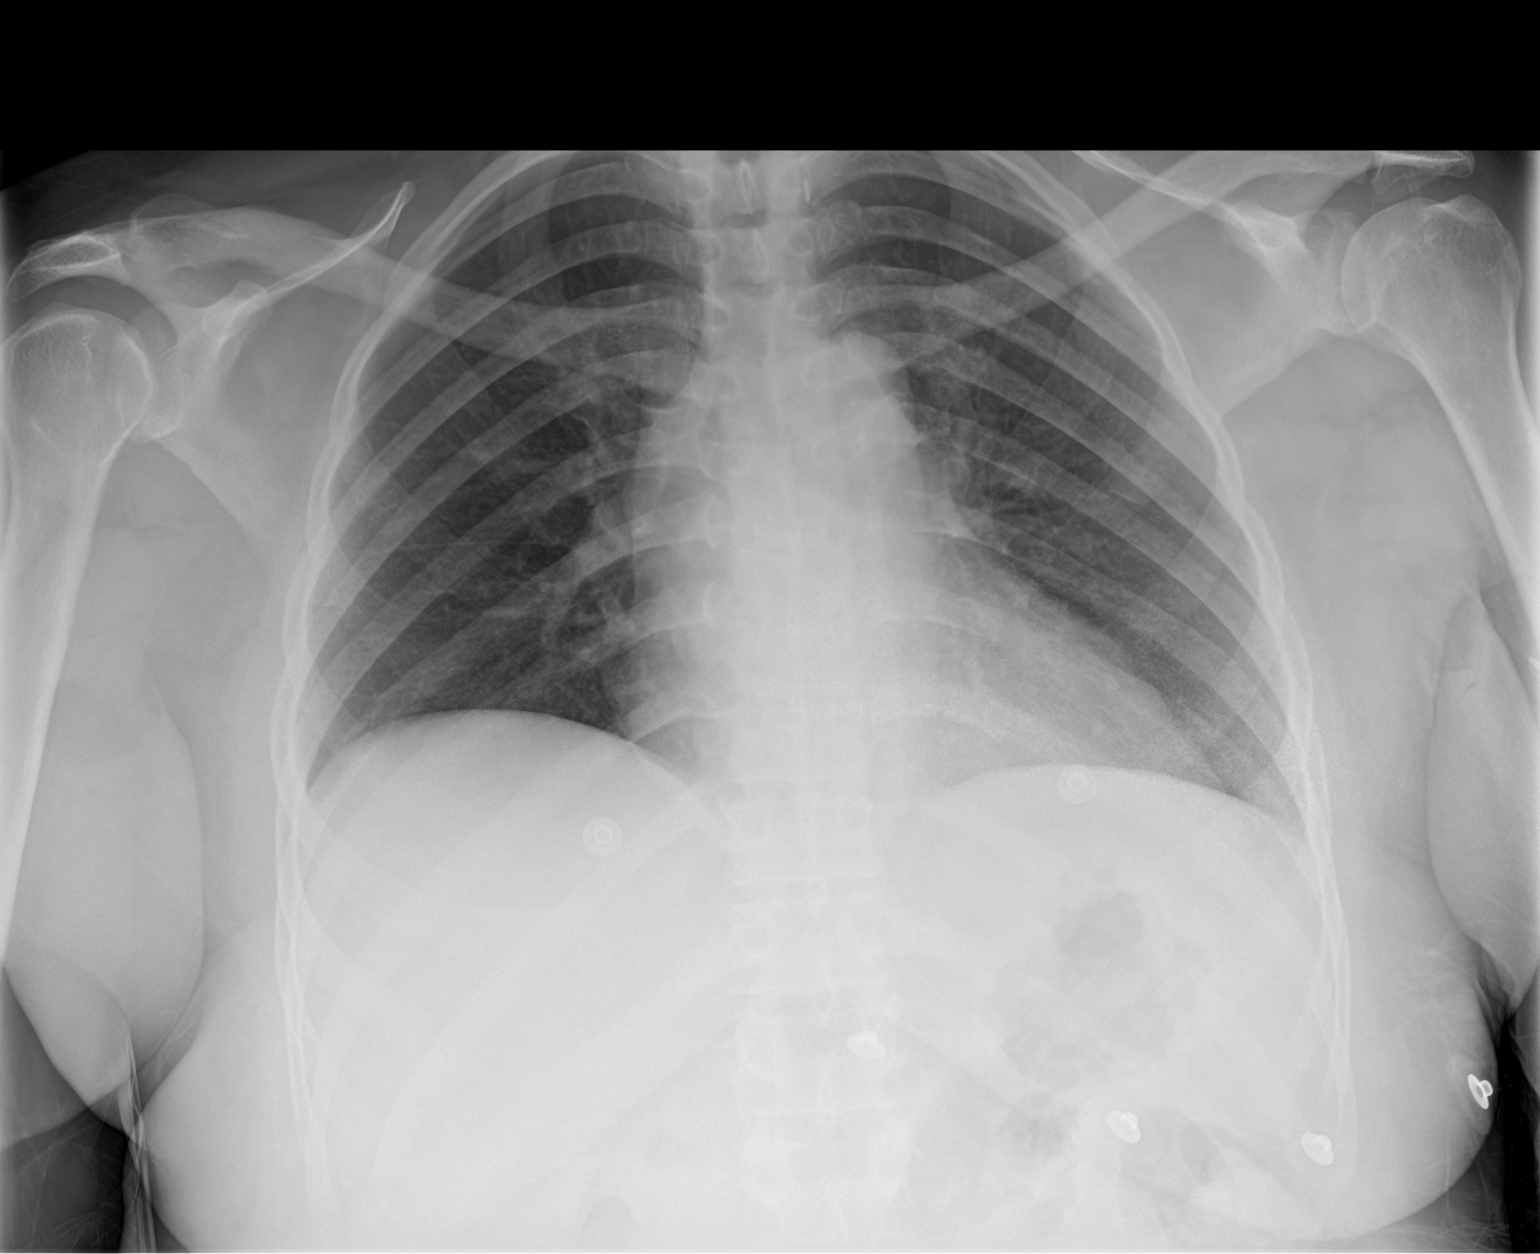

[2 of 2 positions shown; findings below may reference images not displayed]

FINDINGS: Enlargement of cardiac silhouette.

Mediastinal contours and pulmonary vascularity normal.

Resolution of RIGHT pleural effusion and basilar atelectasis seen on
previous exam.

No acute infiltrate, pleural effusion or pneumothorax.

Osseous structures unremarkable.
IMPRESSION: No acute abnormalities.

Enlargement of cardiac silhouette.
